# Patient Record
Sex: Female | Born: 1965
Health system: Southern US, Community
[De-identification: ages and names within clinical notes are randomized; demographics above are authoritative.]

## PROBLEM LIST (undated history)

## (undated) DIAGNOSIS — J189 Pneumonia, unspecified organism: Secondary | ICD-10-CM

## (undated) DIAGNOSIS — G4730 Sleep apnea, unspecified: Secondary | ICD-10-CM

## (undated) DIAGNOSIS — G43909 Migraine, unspecified, not intractable, without status migrainosus: Secondary | ICD-10-CM

## (undated) DIAGNOSIS — K644 Residual hemorrhoidal skin tags: Secondary | ICD-10-CM

## (undated) DIAGNOSIS — R7611 Nonspecific reaction to tuberculin skin test without active tuberculosis: Secondary | ICD-10-CM

## (undated) DIAGNOSIS — R918 Other nonspecific abnormal finding of lung field: Secondary | ICD-10-CM

## (undated) DIAGNOSIS — A159 Respiratory tuberculosis unspecified: Secondary | ICD-10-CM

## (undated) DIAGNOSIS — E785 Hyperlipidemia, unspecified: Secondary | ICD-10-CM

## (undated) DIAGNOSIS — Z8701 Personal history of pneumonia (recurrent): Secondary | ICD-10-CM

## (undated) DIAGNOSIS — E0590 Thyrotoxicosis, unspecified without thyrotoxic crisis or storm: Secondary | ICD-10-CM

## (undated) DIAGNOSIS — Z9289 Personal history of other medical treatment: Secondary | ICD-10-CM

## (undated) DIAGNOSIS — Z9889 Other specified postprocedural states: Secondary | ICD-10-CM

## (undated) DIAGNOSIS — T8859XA Other complications of anesthesia, initial encounter: Secondary | ICD-10-CM

## (undated) DIAGNOSIS — I10 Essential (primary) hypertension: Secondary | ICD-10-CM

## (undated) DIAGNOSIS — R112 Nausea with vomiting, unspecified: Secondary | ICD-10-CM

## (undated) DIAGNOSIS — G510 Bell's palsy: Secondary | ICD-10-CM

## (undated) DIAGNOSIS — I493 Ventricular premature depolarization: Secondary | ICD-10-CM

## (undated) DIAGNOSIS — C719 Malignant neoplasm of brain, unspecified: Secondary | ICD-10-CM

## (undated) DIAGNOSIS — D509 Iron deficiency anemia, unspecified: Secondary | ICD-10-CM

## (undated) HISTORY — PX: TUBAL LIGATION: SHX77

## (undated) HISTORY — PX: ABDOMINAL HYSTERECTOMY: SHX81

## (undated) HISTORY — DX: Migraine, unspecified, not intractable, without status migrainosus: G43.909

## (undated) HISTORY — DX: Personal history of pneumonia (recurrent): Z87.01

## (undated) HISTORY — DX: Other nonspecific abnormal finding of lung field: R91.8

## (undated) HISTORY — PX: ENDOMETRIAL ABLATION: SHX621

## (undated) HISTORY — PX: HEMORRHOID SURGERY: SHX153

## (undated) HISTORY — DX: Essential (primary) hypertension: I10

## (undated) HISTORY — DX: Ventricular premature depolarization: I49.3

## (undated) HISTORY — PX: BREAST BIOPSY: SHX20

## (undated) HISTORY — DX: Nonspecific reaction to tuberculin skin test without active tuberculosis: R76.11

## (undated) HISTORY — DX: Hyperlipidemia, unspecified: E78.5

## (undated) HISTORY — DX: Bell's palsy: G51.0

## (undated) HISTORY — DX: Iron deficiency anemia, unspecified: D50.9

## (undated) HISTORY — DX: Residual hemorrhoidal skin tags: K64.4

## (undated) HISTORY — DX: Thyrotoxicosis, unspecified without thyrotoxic crisis or storm: E05.90

---

## 2000-05-30 ENCOUNTER — Encounter: Payer: Self-pay | Admitting: Family Medicine

## 2000-05-30 ENCOUNTER — Encounter: Admission: RE | Admit: 2000-05-30 | Discharge: 2000-05-30 | Payer: Self-pay | Admitting: Family Medicine

## 2000-10-14 ENCOUNTER — Encounter: Payer: Self-pay | Admitting: Internal Medicine

## 2000-10-14 ENCOUNTER — Emergency Department (HOSPITAL_COMMUNITY): Admission: EM | Admit: 2000-10-14 | Discharge: 2000-10-14 | Payer: Self-pay

## 2002-05-12 ENCOUNTER — Encounter: Admission: RE | Admit: 2002-05-12 | Discharge: 2002-07-28 | Payer: Self-pay | Admitting: Neurology

## 2003-07-18 ENCOUNTER — Encounter: Admission: RE | Admit: 2003-07-18 | Discharge: 2003-07-18 | Payer: Self-pay | Admitting: Family Medicine

## 2004-02-02 ENCOUNTER — Ambulatory Visit (HOSPITAL_COMMUNITY): Admission: RE | Admit: 2004-02-02 | Discharge: 2004-02-02 | Payer: Self-pay | Admitting: Obstetrics and Gynecology

## 2004-02-02 ENCOUNTER — Encounter (INDEPENDENT_AMBULATORY_CARE_PROVIDER_SITE_OTHER): Payer: Self-pay | Admitting: Specialist

## 2006-02-05 ENCOUNTER — Inpatient Hospital Stay (HOSPITAL_COMMUNITY): Admission: AD | Admit: 2006-02-05 | Discharge: 2006-02-06 | Payer: Self-pay | Admitting: Cardiology

## 2006-02-05 ENCOUNTER — Ambulatory Visit: Payer: Self-pay | Admitting: Cardiology

## 2006-11-04 ENCOUNTER — Ambulatory Visit: Payer: Self-pay | Admitting: Cardiology

## 2006-11-05 ENCOUNTER — Ambulatory Visit: Payer: Self-pay | Admitting: Cardiology

## 2006-11-21 ENCOUNTER — Ambulatory Visit: Payer: Self-pay | Admitting: Internal Medicine

## 2006-11-25 ENCOUNTER — Ambulatory Visit: Payer: Self-pay | Admitting: Cardiology

## 2007-02-19 ENCOUNTER — Ambulatory Visit: Payer: Self-pay | Admitting: Internal Medicine

## 2007-07-26 ENCOUNTER — Emergency Department (HOSPITAL_COMMUNITY): Admission: EM | Admit: 2007-07-26 | Discharge: 2007-07-26 | Payer: Self-pay | Admitting: Emergency Medicine

## 2007-09-08 ENCOUNTER — Ambulatory Visit: Payer: Self-pay | Admitting: Cardiology

## 2007-10-30 ENCOUNTER — Ambulatory Visit: Payer: Self-pay

## 2007-11-26 ENCOUNTER — Telehealth (INDEPENDENT_AMBULATORY_CARE_PROVIDER_SITE_OTHER): Payer: Self-pay | Admitting: *Deleted

## 2008-02-05 ENCOUNTER — Ambulatory Visit: Payer: Self-pay | Admitting: Emergency Medicine

## 2008-02-05 DIAGNOSIS — R05 Cough: Secondary | ICD-10-CM | POA: Insufficient documentation

## 2008-02-05 DIAGNOSIS — R053 Chronic cough: Secondary | ICD-10-CM | POA: Insufficient documentation

## 2008-02-05 DIAGNOSIS — E785 Hyperlipidemia, unspecified: Secondary | ICD-10-CM | POA: Insufficient documentation

## 2008-02-05 DIAGNOSIS — I10 Essential (primary) hypertension: Secondary | ICD-10-CM | POA: Insufficient documentation

## 2008-02-05 DIAGNOSIS — J984 Other disorders of lung: Secondary | ICD-10-CM

## 2008-02-05 DIAGNOSIS — R918 Other nonspecific abnormal finding of lung field: Secondary | ICD-10-CM | POA: Insufficient documentation

## 2008-02-05 DIAGNOSIS — R0602 Shortness of breath: Secondary | ICD-10-CM | POA: Insufficient documentation

## 2008-02-09 ENCOUNTER — Ambulatory Visit: Payer: Self-pay | Admitting: Cardiovascular Disease

## 2008-02-24 ENCOUNTER — Encounter: Payer: Self-pay | Admitting: Emergency Medicine

## 2008-04-11 ENCOUNTER — Ambulatory Visit: Payer: Self-pay | Admitting: Emergency Medicine

## 2008-04-11 LAB — CONVERTED CEMR LAB: Angiotensin 1 Converting Enzyme: 32 units/L (ref 9–67)

## 2008-04-13 ENCOUNTER — Encounter: Payer: Self-pay | Admitting: Emergency Medicine

## 2008-04-20 ENCOUNTER — Ambulatory Visit: Admission: RE | Admit: 2008-04-20 | Discharge: 2008-04-20 | Payer: Self-pay | Admitting: Emergency Medicine

## 2008-04-20 ENCOUNTER — Ambulatory Visit: Payer: Self-pay | Admitting: Emergency Medicine

## 2008-04-20 ENCOUNTER — Encounter: Payer: Self-pay | Admitting: Emergency Medicine

## 2008-04-27 ENCOUNTER — Telehealth: Payer: Self-pay | Admitting: Emergency Medicine

## 2008-04-28 ENCOUNTER — Ambulatory Visit: Payer: Self-pay | Admitting: Internal Medicine

## 2008-04-28 DIAGNOSIS — R079 Chest pain, unspecified: Secondary | ICD-10-CM | POA: Insufficient documentation

## 2008-05-03 ENCOUNTER — Telehealth (INDEPENDENT_AMBULATORY_CARE_PROVIDER_SITE_OTHER): Payer: Self-pay | Admitting: *Deleted

## 2008-05-04 ENCOUNTER — Ambulatory Visit: Payer: Self-pay | Admitting: Emergency Medicine

## 2008-06-07 ENCOUNTER — Telehealth: Payer: Self-pay | Admitting: Emergency Medicine

## 2008-10-05 ENCOUNTER — Ambulatory Visit: Payer: Self-pay | Admitting: Emergency Medicine

## 2008-11-09 ENCOUNTER — Ambulatory Visit: Payer: Self-pay | Admitting: Internal Medicine

## 2008-12-08 ENCOUNTER — Ambulatory Visit: Payer: Self-pay | Admitting: Emergency Medicine

## 2009-03-07 ENCOUNTER — Telehealth: Payer: Self-pay | Admitting: Emergency Medicine

## 2009-03-07 ENCOUNTER — Ambulatory Visit: Payer: Self-pay | Admitting: Emergency Medicine

## 2009-03-07 DIAGNOSIS — J069 Acute upper respiratory infection, unspecified: Secondary | ICD-10-CM | POA: Insufficient documentation

## 2009-03-13 ENCOUNTER — Telehealth: Payer: Self-pay | Admitting: Emergency Medicine

## 2009-05-04 HISTORY — PX: COLONOSCOPY: SHX5424

## 2009-05-23 ENCOUNTER — Ambulatory Visit: Payer: Self-pay | Admitting: Cardiology

## 2009-06-05 ENCOUNTER — Encounter (INDEPENDENT_AMBULATORY_CARE_PROVIDER_SITE_OTHER): Payer: Self-pay | Admitting: *Deleted

## 2009-06-05 ENCOUNTER — Ambulatory Visit (HOSPITAL_BASED_OUTPATIENT_CLINIC_OR_DEPARTMENT_OTHER): Admission: RE | Admit: 2009-06-05 | Discharge: 2009-06-05 | Payer: Self-pay | Admitting: General Surgery

## 2009-06-06 ENCOUNTER — Encounter (INDEPENDENT_AMBULATORY_CARE_PROVIDER_SITE_OTHER): Payer: Self-pay | Admitting: *Deleted

## 2009-06-14 ENCOUNTER — Encounter (INDEPENDENT_AMBULATORY_CARE_PROVIDER_SITE_OTHER): Payer: Self-pay | Admitting: *Deleted

## 2009-06-14 ENCOUNTER — Observation Stay (HOSPITAL_COMMUNITY): Admission: EM | Admit: 2009-06-14 | Discharge: 2009-06-15 | Payer: Self-pay | Admitting: Emergency Medicine

## 2009-06-15 ENCOUNTER — Encounter (INDEPENDENT_AMBULATORY_CARE_PROVIDER_SITE_OTHER): Payer: Self-pay | Admitting: *Deleted

## 2009-06-16 ENCOUNTER — Inpatient Hospital Stay (HOSPITAL_COMMUNITY): Admission: EM | Admit: 2009-06-16 | Discharge: 2009-06-19 | Payer: Self-pay | Admitting: Emergency Medicine

## 2009-06-16 ENCOUNTER — Encounter (INDEPENDENT_AMBULATORY_CARE_PROVIDER_SITE_OTHER): Payer: Self-pay | Admitting: *Deleted

## 2009-06-17 ENCOUNTER — Encounter (INDEPENDENT_AMBULATORY_CARE_PROVIDER_SITE_OTHER): Payer: Self-pay | Admitting: *Deleted

## 2010-01-26 ENCOUNTER — Other Ambulatory Visit: Admission: RE | Admit: 2010-01-26 | Discharge: 2010-01-26 | Payer: Self-pay | Admitting: Family Medicine

## 2010-01-26 ENCOUNTER — Ambulatory Visit: Payer: Self-pay | Admitting: Family Medicine

## 2010-01-26 DIAGNOSIS — R51 Headache: Secondary | ICD-10-CM | POA: Insufficient documentation

## 2010-01-26 DIAGNOSIS — E0590 Thyrotoxicosis, unspecified without thyrotoxic crisis or storm: Secondary | ICD-10-CM | POA: Insufficient documentation

## 2010-01-26 DIAGNOSIS — D509 Iron deficiency anemia, unspecified: Secondary | ICD-10-CM | POA: Insufficient documentation

## 2010-01-26 DIAGNOSIS — R519 Headache, unspecified: Secondary | ICD-10-CM | POA: Insufficient documentation

## 2010-01-26 LAB — CONVERTED CEMR LAB
Basophils Absolute: 0.1 10*3/uL (ref 0.0–0.1)
Basophils Relative: 1 % (ref 0–1)
CO2: 28 meq/L (ref 19–32)
Calcium: 10.1 mg/dL (ref 8.4–10.5)
Cholesterol: 238 mg/dL — ABNORMAL HIGH (ref 0–200)
Eosinophils Absolute: 0.2 10*3/uL (ref 0.0–0.7)
Eosinophils Relative: 3 % (ref 0–5)
Glucose, Urine, Semiquant: NEGATIVE
HCT: 40.7 % (ref 36.0–46.0)
HDL: 32 mg/dL — ABNORMAL LOW (ref >39)
Hemoglobin: 13.8 g/dL (ref 12.0–15.0)
Ketones, urine, test strip: NEGATIVE
LDL Cholesterol: 184 mg/dL — ABNORMAL HIGH (ref 0–99)
Lymphs Abs: 2.5 10*3/uL (ref 0.7–4.0)
Monocytes Relative: 5 % (ref 3–12)
Protein, U semiquant: NEGATIVE
RDW: 12.5 % (ref 11.5–15.5)
Sodium: 141 meq/L (ref 135–145)
Specific Gravity, Urine: 1.015
TSH: 3.999 microintl units/mL (ref 0.350–4.500)
Triglycerides: 108 mg/dL (ref <150)
VLDL: 22 mg/dL (ref 0–40)

## 2010-02-02 ENCOUNTER — Ambulatory Visit: Payer: Self-pay | Admitting: Family Medicine

## 2010-02-13 ENCOUNTER — Telehealth: Payer: Self-pay | Admitting: *Deleted

## 2010-02-16 ENCOUNTER — Telehealth: Payer: Self-pay | Admitting: Family Medicine

## 2010-02-22 ENCOUNTER — Telehealth: Payer: Self-pay | Admitting: Family Medicine

## 2010-02-22 DIAGNOSIS — R1084 Generalized abdominal pain: Secondary | ICD-10-CM | POA: Insufficient documentation

## 2010-02-23 ENCOUNTER — Encounter: Payer: Self-pay | Admitting: Internal Medicine

## 2010-03-04 ENCOUNTER — Emergency Department (HOSPITAL_COMMUNITY): Admission: EM | Admit: 2010-03-04 | Discharge: 2010-03-04 | Payer: Self-pay | Admitting: Emergency Medicine

## 2010-03-06 ENCOUNTER — Ambulatory Visit: Payer: Self-pay | Admitting: Family Medicine

## 2010-03-06 ENCOUNTER — Telehealth: Payer: Self-pay | Admitting: Family Medicine

## 2010-03-06 DIAGNOSIS — M62838 Other muscle spasm: Secondary | ICD-10-CM | POA: Insufficient documentation

## 2010-03-14 ENCOUNTER — Encounter
Admission: RE | Admit: 2010-03-14 | Discharge: 2010-05-02 | Payer: Self-pay | Source: Home / Self Care | Attending: Family Medicine | Admitting: Family Medicine

## 2010-04-03 ENCOUNTER — Telehealth: Payer: Self-pay | Admitting: Internal Medicine

## 2010-04-04 ENCOUNTER — Telehealth: Payer: Self-pay | Admitting: Internal Medicine

## 2010-05-08 ENCOUNTER — Ambulatory Visit: Admit: 2010-05-08 | Payer: Self-pay | Admitting: Family Medicine

## 2010-05-11 ENCOUNTER — Telehealth: Payer: Self-pay | Admitting: Internal Medicine

## 2010-05-11 ENCOUNTER — Ambulatory Visit: Admit: 2010-05-11 | Payer: Self-pay | Admitting: Internal Medicine

## 2010-05-14 ENCOUNTER — Ambulatory Visit: Admit: 2010-05-14 | Payer: Self-pay | Admitting: Family Medicine

## 2010-05-25 ENCOUNTER — Telehealth: Payer: Self-pay | Admitting: Family Medicine

## 2010-05-25 DIAGNOSIS — K649 Unspecified hemorrhoids: Secondary | ICD-10-CM | POA: Insufficient documentation

## 2010-05-25 DIAGNOSIS — K5900 Constipation, unspecified: Secondary | ICD-10-CM | POA: Insufficient documentation

## 2010-06-07 NOTE — Progress Notes (Signed)
----   Converted from flag ---- ---- 05/11/2010 9:35 AM, Hilarie Fredrickson MD wrote: ok. covert this to phone note please  ---- 05/11/2010 9:19 AM, Milford Cage NCMA wrote:   ---- 05/11/2010 8:58 AM, [place] Johnson wrote: hEY This patient just called to cancel her appt because she did not get off work..she was a new pt at 3:30 ------------------------------  Phone Note Other Incoming   Caller: patient

## 2010-06-07 NOTE — Progress Notes (Signed)
Summary: Pt calle req status of FMLA paperwork  Phone Note Call from Patient Call back at [phone] cell      Caller: Patient Summary of Call: Pt called req status of FMLA papers she brought by the office last week. Pt is needing to pick this up asap. Pls call when ready for pick up.  Initial call taken by: Lucy Antigua,  February 13, 2010 8:38 AM  Follow-up for Phone Call        ??????????????please call the patient to find out what the FMLA form is for ?????????  Roderick Pee MD,  February 13, 2010 1:13 PM  called cell number and voicemail box has not been set up, called home number and it's the wrong number and work number has been disconnected. DeShannon Smith CMA Duncan Dull)  February 13, 2010 1:32 PM   Additional Follow-up for Phone Call Additional follow up Details #1::        Pt says that because she as doubled up on stool softeners, as per Dr. Barbette Or recommendation, she is late for work. She is needing FMLA in order to cover being late for her job. Pt says that she is having pain where she is have to go so much.    Additional Follow-up by: Lucy Antigua,  February 13, 2010 2:09 PM    Additional Follow-up for Phone Call Additional follow up Details #2::    Pt is having more pain and bleeding rectally.  Will call the surgeon, and let us know how they would like to proceed? Follow-up by: Lynann Beaver CMA,  February 14, 2010 1:13 PM  Additional Follow-up for Phone Call Additional follow up Details #3:: Details for Additional Follow-up Action Taken: refer to general surgery for further evaluation.  Additional Follow-up by: Roderick Pee MD,  February 15, 2010 8:17 AM   Appended Document: Pt calle req status of FMLA paperwork what should she do about her FMLA papers?

## 2010-06-07 NOTE — Assessment & Plan Note (Signed)
Summary: 1 wk rov/njr   Vital Signs:  Patient profile:   45 year old female Weight:      150 pounds Temp:     99.2 degrees F oral BP sitting:   120 / 82  (left arm) Cuff size:   regular  Vitals Entered By: Kathrynn Speed CMA (February 02, 2010 2:37 PM) CC: 1 wk rov, lads for review, src Is Patient Diabetic? No   Primary Care Provider:  Dr Day  CC:  1 wk rov, lads for review, and src.  History of Present Illness: [name] is a 45 year old female, who comes in today for evaluation of hypertension.  We saw her on September the 23rd with elevation of her blood pressure 160 to 180 over hundred.  We start her on Hyzaar 50 -- 12.5.  BP now 120/80.  Lab data the was within normal limits except for marked elevation of her cholesterol total 238, HDL low at 32, LDL high at 184, with normal triglycerides.  Hyperlipidemia runs on the mother's side of her family  Preventive Screening-Counseling & Management  Alcohol-Tobacco     Smoking Status: never  Current Medications (verified): 1)  Aleve 220 Mg Tabs (Naproxen Sodium) .... As Needed 2)  Excedrin Extra Strength [phone] Mg Tabs (Aspirin-Acetaminophen-Caffeine) .... As Needed 3)  Zomig Zmt 2.5 Mg Tbdp (Zolmitriptan) .... Uad For Migraines 4)  Hyzaar 50-12.5 Mg Tabs (Losartan Potassium-Hctz) .... Take 1 Tablet By Mouth Every Morning  Allergies (verified): No Known Drug Allergies  Review of Systems      See HPI  Physical Exam  General:  Well-developed,well-nourished,in no acute distress; alert,appropriate and cooperative throughout examination   Impression & Recommendations:  Problem # 1:  HYPERTENSION (ICD-401.9) Assessment Improved  Her updated medication list for this problem includes:    Hyzaar 50-12.5 Mg Tabs (Losartan potassium-hctz) .Marland Kitchen... Take 1 tablet by mouth every morning  Orders: Prescription Created Electronically [phone])  Problem # 2:  HYPERLIPIDEMIA (ICD-272.4) Assessment: Deteriorated  Her updated  medication list for this problem includes:    Zocor 20 Mg Tabs (Simvastatin) .Marland Kitchen... 1 tab @ bedtime  Orders: Prescription Created Electronically [phone])  Complete Medication List: 1)  Aleve 220 Mg Tabs (Naproxen sodium) .... As needed 2)  Excedrin Extra Strength [phone] Mg Tabs (Aspirin-acetaminophen-caffeine) .... As needed 3)  Zomig Zmt 2.5 Mg Tbdp (Zolmitriptan) .... Uad for migraines 4)  Hyzaar 50-12.5 Mg Tabs (Losartan potassium-hctz) .... Take 1 tablet by mouth every morning 5)  Zocor 20 Mg Tabs (Simvastatin) .Marland Kitchen.. 1 tab @ bedtime  Patient Instructions: 1)  continued the blood pressure medicine daily, and check your blood pressure, just once a week since it is  back to normal.  Begin Zocor 20 mg nightly along with an 81-mg baby aspirin. 2)  Please schedule a follow-up appointment in 2 months. 3)  Hepatic Panel prior to visit, ICD-9:......272.00 4)  Lipid Panel prior to visit, ICD-9: 5)  BMP prior to visit, ICD-9: Prescriptions: ZOCOR 20 MG TABS (SIMVASTATIN) 1 tab @ bedtime  #100 x 3   Entered and Authorized by:   Roderick Pee MD   Signed by:   Roderick Pee MD on 02/02/2010   Method used:   Electronically to        [facility]  [address] (retail)       [name]       [place], [place]  [address]       Ph: [phone]  Fax: [phone]   RxID:   [id number]

## 2010-06-07 NOTE — Progress Notes (Signed)
Summary: FMLA FORMS  Phone Note Call from Patient Call back at Home Phone [phone]   Caller: Patient Call For: Roderick Pee MD Summary of Call: Pt is call back regarding fmla paperwork she dropped off last week. Initial call taken by: Heron Sabins,  February 16, 2010 2:06 PM  Follow-up for Phone Call        sundrae ......... please help [place] with this Follow-up by: Roderick Pee MD,  February 16, 2010 2:28 PM  Additional Follow-up for Phone Call Additional follow up Details #1::        Unable to reach pt via phone.  Additional Follow-up by: Trixie Dredge,  February 22, 2010 9:08 AM

## 2010-06-07 NOTE — Op Note (Signed)
Summary: Operative Report       NAME:  [name], [name]              ACCOUNT NO.:  [id number]      MEDICAL RECORD NO.:  [id number]          PATIENT TYPE:  AMB      LOCATION:  DSC                          FACILITY:  [facility]      PHYSICIAN:  Gabrielle Dare. Janee Morn, M.D.DATE OF BIRTH:  May 05, 1966      DATE OF PROCEDURE:  06/05/2009   DATE OF DISCHARGE:                                  OPERATIVE REPORT      PREOPERATIVE DIAGNOSIS:  Internal hemorrhoid.      POSTOPERATIVE DIAGNOSIS:  Internal hemorrhoid.      PROCEDURES:  Internal hemorrhoidectomy single-column.      SURGEON:  Gabrielle Dare. Janee Morn, MD      ANESTHESIA:  General.      HISTORY OF PRESENT ILLNESS:  [name] is a 45 year old female who I   evaluated in the office for symptomatic internal hemorrhoids.  This was   too low and painful to tolerate banding in the office so she presents   for elective internal hemorrhoidectomy.      PROCEDURE IN DETAIL:  Informed consent was obtained.  The patient was   identified in the preop holding area.  She received intravenous   antibiotics.  She was brought to the operating room.  General anesthesia   with laryngeal mask airway was administered by the anesthesia staff.   She was placed in lithotomy position.  Her perianal area was prepped and   draped in sterile fashion.  Time-out procedure was done.  0.5% Marcaine   with epinephrine was injected around the perianal region.  Digital   rectal exam revealed a large internal hemorrhoid around the 9 o'clock   position.  Anoscopy was then done again visualizing this large internal   hemorrhoid at the 9 o'clock position.  No other significant internal   hemorrhoids were seen.  External anal exam did reveal just some skin   tags and no external hemorrhoids.  There was no evidence of infection.   At this time, we proceeded with the internal hemorrhoidectomy.  This   hemorrhoid was large and appeared somewhat excoriated and raw along the   surface.  A  2-0 chromic suture was placed high above the base of the   hemorrhoid.  A triangular incision was made excising the entirety of the   hemorrhoid that did extend out to the junction with the anoderm.   Subsequently, the vein was dissected up and out with the hemorrhoid and   it was excised in one piece using Bovie cautery achieving excellent   hemostasis.  The mucosal defect was then closed by using that 2-0   chromic that we had placed initially in a running fashion.  This   completely closed the mucosal defect and had good hemostasis.  The   remainder of the rectum was then inspected with the retractor   circumferentially.  There was no other significant internal hemorrhoids.   There was no bleeding from the hemorrhoidectomy site and the suture was   intact.  The area was  copiously irrigated.  Irrigation fluid returned   clear.  Suture line was checked and there was no further bleeding or   swelling or hematoma.  A thrombin-   soaked Gelfoam pad was placed as an endorectal dressing followed by   sterile gauze.  Sponge, needle, and instrument counts were all correct.   The patient tolerated the procedure well without apparent complication   and was taken to the recovery room in stable condition.               Gabrielle Dare Janee Morn, M.D.            BET/MEDQ  D:  06/05/2009  T:  06/06/2009  Job:  [id number]      cc:   Jordan Hawks. Elnoria Howard, MD         Electronically Signed by Violeta Gelinas M.D. on 06/08/2009 10:14:39 PM

## 2010-06-07 NOTE — Letter (Signed)
Summary: New Patient letter  [facility]  [address] [place], [place] [address]   Phone: [phone]  Fax: [phone]       02/23/2010 MRN: [id number]  [name] [address] [place], [place]  [address]  Dear [name],  Welcome to the Gastroenterology Division at [facility].    You are scheduled to see Dr. Marina Goodell on 04-04-10 at 9:15am on the 3rd floor at [facility], [address]  We ask that you try to arrive at our office 15 minutes prior to your appointment time to allow for check-in.  We would like you to complete the enclosed self-administered evaluation form prior to your visit and bring it with you on the day of your appointment.  We will review it with you.  Also, please bring a complete list of all your medications or, if you prefer, bring the medication bottles and we will list them.  Please bring your insurance card so that we may make a copy of it.  If your insurance requires a referral to see a specialist, please bring your referral form from your primary care physician.  Co-payments are due at the time of your visit and may be paid by cash, check or credit card.     Your office visit will consist of a consult with your physician (includes a physical exam), any laboratory testing he/she may order, scheduling of any necessary diagnostic testing (e.g. x-ray, ultrasound, CT-scan), and scheduling of a procedure (e.g. Endoscopy, Colonoscopy) if required.  Please allow enough time on your schedule to allow for any/all of these possibilities.    If you cannot keep your appointment, please call [phone] to cancel or reschedule prior to your appointment date.  This allows [place] the opportunity to schedule an appointment for another patient in need of care.  If you do not cancel or reschedule by 5 p.m. the business day prior to your appointment date, you will be charged a $50.00 late cancellation/no-show fee.    Thank you for choosing [facility] for your medical needs.  We appreciate the opportunity to care for you.  Please visit [place] at our website  to learn more about our practice.                     Sincerely,                                                             The Gastroenterology Division

## 2010-06-07 NOTE — Assessment & Plan Note (Signed)
Summary: new to est//ccm---PT RSC (BMP) // RS   Vital Signs:  Patient profile:   45 year old female Height:      68 inches Weight:      155 pounds Temp:     98.3 degrees F oral BP sitting:   160 / 120  (left arm) Cuff size:   regular  Vitals Entered By: Kern Reap CMA Duncan Dull) (January 26, 2010 4:01 PM) CC: new to establish care, tired Is Patient Diabetic? No Pain Assessment Patient in pain? no        Primary Care Provider:  Dr Day  CC:  new to establish care and tired.  History of Present Illness: [name] is a 45 year old, divorced female, G2, P2, nonsmoker, who comes in today as a new patient for evaluation of multiple issues.  She is a history of hypertension was placed on 5 mg of the beta-blocker is not been monitoring her blood pressure.  Last blood pressure was done February 2011 was apparently normal.  BP today 160/120.she has been on blood pressure medication.  The past and at the end of our discussion.  She recalls an ACE inhibitor gave her a cough  She's not had a physical or a Pap in over two years.  She has had an endometrial ablation therefore, does not have any periods.  She's had an and history of hyperlipidemia, and she, herself has had high cholesterol, but is never been addressed.  She also has a history of migraine headaches.  They occur about 6 times per year.  Over-the-cmedications.  Does not help.  She also has a history of PVCs and a cardiac evaluation negative.  CT scan showed pulmonary nodules was referred to pulmonary workup negative, except for positive PPD.  Was given 6 months of INH.  Advised never to get a skin test ever again.  She's never had a mammogram.  Two children 61 year old son and 65 year old daughter in good health.  Tetanus 2009.  She also had hyperthyroidism, when she was 45 years of age and treated with oral medications.  In she had Bell's palsy, which has left her with some slight residual paralysis in her left eyelid. she's also  had a history of hemorrhoid surgery x 2.She is not taking her stool softeners as directed  Preventive Screening-Counseling & Management  Alcohol-Tobacco     Smoking Status: never  Allergies (verified): No Known Drug Allergies  Past History:  Past medical, surgical, family and social histories (including risk factors) reviewed, and no changes noted (except as noted below).  Past Medical History: Hyperlipidemia Hypertension Pulmonary Nodules, not seen on plain cxr...........................................Marland KitchenByrum     - First detected 10/07     - RE cT 10/08      - Pos IPPD > 15 mm 12/09     - FOB/lavage 04/20/08 neg afb smear Headache Anemia-iron deficiency Positive PPD Hyperthyroidism hx 45years old - oral meds bells palsey with res. left lag cough from ACE inhibitor  Past Surgical History: Reviewed history from 05/23/2009 and no changes required. Tubal ligation-17 yrs ago Endometrial ablation-4-5 yrs ago  Family History: Reviewed history from 02/05/2008 and no changes required. aunts, uncles-heart disease mat grandmother-heart disease sister-heart disease, deceased - kidney failure aunts and uncles-lung and colon CA 3 brothers -healthy  Social History: Reviewed history from 04/28/2008 and no changes required. Patient never smoked.  Pt is divorced with children. Occupation: Corporate investment banker Possible TB exposure 2008 (after nodules found). from [place], lived there and [place].  Never owned birds.   Review of Systems      See HPI  Physical Exam  General:  Well-developed,well-nourished,in no acute distress; alert,appropriate and cooperative throughout examination Head:  Normocephalic and atraumatic without obvious abnormalities. No apparent alopecia or balding. Eyes:  rather normal left eye normal except for just 1 mm of lid lag Ears:  External ear exam shows no significant lesions or deformities.  Otoscopic examination reveals clear canals, tympanic  membranes are intact bilaterally without bulging, retraction, inflammation or discharge. Hearing is grossly normal bilaterally. Nose:  External nasal examination shows no deformity or inflammation. Nasal mucosa are pink and moist without lesions or exudates. Mouth:  Oral mucosa and oropharynx without lesions or exudates.  Teeth in good repair. Neck:  No deformities, masses, or tenderness noted. Breasts:  No mass, nodules, thickening, tenderness, bulging, retraction, inflamation, nipple discharge or skin changes noted. thickening right and left breast from the 3 to the 9 o'clock position.  No palpable nodules  Lungs:  Normal respiratory effort, chest expands symmetrically. Lungs are clear to auscultation, no crackles or wheezes. Heart:  Normal rate and regular rhythm. S1 and S2 normal without gallop, murmur, click, rub or other extra sounds. Abdomen:  Bowel sounds positive,abdomen soft and non-tender without masses, organomegaly or hernias noted. Rectal:  extremely tight sphincter guaiac-negative Genitalia:  externa genitalia within normal limits.  Vaginal vault was normal.  Cervix was visualized.  Pap smear was done.  Bimanual exam shows the uterus two times normal.  History of fibroids Msk:  No deformity or scoliosis noted of thoracic or lumbar spine.   Pulses:  R and L carotid,radial,femoral,dorsalis pedis and posterior tibial pulses are full and equal bilaterally Extremities:  No clubbing, cyanosis, edema, or deformity noted with normal full range of motion of all joints.   Neurologic:  No cranial nerve deficits noted. Station and gait are normal. Plantar reflexes are down-going bilaterally. DTRs are symmetrical throughout. Sensory, motor and coordinative functions appear intact. Skin:  Intact without suspicious lesions or rashes Cervical Nodes:  No lymphadenopathy noted Axillary Nodes:  No palpable lymphadenopathy Inguinal Nodes:  No significant adenopathy Psych:  Cognition and judgment appear  intact. Alert and cooperative with normal attention span and concentration. No apparent delusions, illusions, hallucinations   Impression & Recommendations:  Problem # 1:  HEADACHE (ICD-784.0) Assessment New  The following medications were removed from the medication list:    Bystolic 5 Mg Tabs (Nebivolol hcl) ..... One daily Her updated medication list for this problem includes:    Aleve 220 Mg Tabs (Naproxen sodium) .Marland Kitchen... As needed    Excedrin Extra Strength 250-250-65 Mg Tabs (Aspirin-acetaminophen-caffeine) .Marland Kitchen... As needed    Zomig Zmt 2.5 Mg Tbdp (Zolmitriptan) ..... Uad for migraines  Orders: Venipuncture (04540) TLB-Lipid Panel (80061-LIPID) TLB-BMP (Basic Metabolic Panel-BMET) (80048-METABOL) TLB-CBC Platelet - w/Differential (85025-CBCD) TLB-Hepatic/Liver Function Pnl (80076-HEPATIC) TLB-TSH (Thyroid Stimulating Hormone) (98119-JYN) Prescription Created Electronically [phone]) UA Dipstick w/o Micro (automated)  (81003)  Problem # 2:  HYPERTENSION (ICD-401.9) Assessment: Deteriorated  The following medications were removed from the medication list:    Bystolic 5 Mg Tabs (Nebivolol hcl) ..... One daily    Zestoretic 20-25 Mg Tabs (Lisinopril-hydrochlorothiazide) .Marland Kitchen... Take 1 tablet by mouth every morning Her updated medication list for this problem includes:    Hyzaar 50-12.5 Mg Tabs (Losartan potassium-hctz) .Marland Kitchen... Take 1 tablet by mouth every morning  Orders: EKG w/ Interpretation (93000) Venipuncture (21308) TLB-Lipid Panel (80061-LIPID) TLB-BMP (Basic Metabolic Panel-BMET) (80048-METABOL) TLB-CBC Platelet - w/Differential (85025-CBCD) TLB-Hepatic/Liver Function  Pnl (80076-HEPATIC) TLB-TSH (Thyroid Stimulating Hormone) [phone]) Prescription Created Electronically [phone]) UA Dipstick w/o Micro (automated)  (81003)  Problem # 3:  HYPERLIPIDEMIA (ICD-272.4) Assessment: Unchanged  Orders: EKG w/ Interpretation (93000) Venipuncture (11914) TLB-Lipid Panel  (80061-LIPID) TLB-BMP (Basic Metabolic Panel-BMET) (80048-METABOL) TLB-CBC Platelet - w/Differential (85025-CBCD) TLB-Hepatic/Liver Function Pnl (80076-HEPATIC) TLB-TSH (Thyroid Stimulating Hormone) (78295-AOZ) Prescription Created Electronically [phone]) UA Dipstick w/o Micro (automated)  (81003)  Problem # 4:  Preventive Health Care (ICD-V70.0) Assessment: New  Complete Medication List: 1)  Aleve 220 Mg Tabs (Naproxen sodium) .... As needed 2)  Excedrin Extra Strength [phone] Mg Tabs (Aspirin-acetaminophen-caffeine) .... As needed 3)  Zomig Zmt 2.5 Mg Tbdp (Zolmitriptan) .... Uad for migraines 4)  Hyzaar 50-12.5 Mg Tabs (Losartan potassium-hctz) .... Take 1 tablet by mouth every morning  Patient Instructions: 1)  begin hyzaar  take one tablet now, then one tablet every morning starting tomorrow morning.  Check her blood pressure daily in the morning.  Return in one week for follow-up.when you return to bring a record of all your blood pressure readings and the device 2)  Zomig stat for any migraine headache. 3)   BSE monthly and get set up for a mammogram. 4)  Stay on a complete salt free diet and walk 20 minutes daily 5)  Please schedule a follow-up appointment in 1 year. 6)  Take an Aspirin every day. 7)  take your stool softeners on a daily basis, so you're having at least one to two loose bowel movements daily Prescriptions: HYZAAR 50-12.5 MG TABS (LOSARTAN POTASSIUM-HCTZ) Take 1 tablet by mouth every morning  #100 x 3   Entered and Authorized by:   Roderick Pee MD   Signed by:   Roderick Pee MD on 01/26/2010   Method used:   Electronically to        [facility]  [address] (retail)       [address]       [place], [place]  [address]       Ph: [phone]       Fax: [phone]   RxID:   [id number] ZOMIG ZMT 2.5 MG TBDP (ZOLMITRIPTAN) UAD for migraines  #6 x 4   Entered and Authorized by:   Roderick Pee MD   Signed by:   Roderick Pee MD on  01/26/2010   Method used:   Electronically to        [facility]  [address] (retail)       [address]       [place], [place]  [address]       Ph: [phone]       Fax: [phone]   RxID:   [id number] ZESTORETIC 20-25 MG TABS (LISINOPRIL-HYDROCHLOROTHIAZIDE) Take 1 tablet by mouth every morning  #100 x 3   Entered and Authorized by:   Roderick Pee MD   Signed by:   Roderick Pee MD on 01/26/2010   Method used:   Electronically to        [facility]  [address] ([facility]ail)       [address]       [place], [place]  [address]       Ph: [phone]       Fax: [phone]   RxID:   [id number]    Immunization History:  Tetanus/Td Immunization  History:    Tetanus/Td:  historical (05/07/2007)  Laboratory Results   Urine Tests  Date/Time Received: January 26, 2010   Routine Urinalysis   Color: yellow Appearance: Clear Glucose: negative   (Normal Range: Negative) Bilirubin: negative   (Normal Range: Negative) Ketone: negative   (Normal Range: Negative) Spec. Gravity: 1.015   (Normal Range: 1.003-1.035) Blood: negative   (Normal Range: Negative) pH: 6.0   (Normal Range: 5.0-8.0) Protein: negative   (Normal Range: Negative) Urobilinogen: 0.2   (Normal Range: 0-1) Nitrite: negative   (Normal Range: Negative) Leukocyte Esterace: negative   (Normal Range: Negative)    Comments: Kern Reap CMA (AAMA)  January 26, 2010 5:20 PM

## 2010-06-07 NOTE — Progress Notes (Signed)
Summary: Questions  Phone Note Call from Patient Call back at Home Phone [phone]   Caller: Patient Call For: Dr. Marina Goodell Reason for Call: Talk to Nurse Summary of Call: Pt is returing your call about her constipation Initial call taken by: [place] Johnson,  April 04, 2010 9:53 AM  Follow-up for Phone Call        Left message for patient to call back. Teryl Lucy RN  April 04, 2010 10:06 AM  Pt. informed that after discussion with Dr.Perry she should re-start Miiralax as instucted(pt. did not go buy any yet)and he advises she contact Dr.Todd for additional orders as she has not been seen by Dr.Perry yet. and . to keep appt.as scheduled.. Follow-up by: Teryl Lucy RN,  April 04, 2010 10:20 AM

## 2010-06-07 NOTE — Letter (Signed)
Summary: Out of Work  Adult nurse at [employer]  [address]   [place], [place] [address]   Phone: [phone]  Fax: [phone]    March 06, 2010   Employee:  KILYN MARAGH    To Whom It May Concern:   For Medical reasons, please excuse the above named employee from work for the following dates:  Start:   March 05, 2010  End:    If you need additional information, please feel free to contact our office.         Sincerely,    Kelle Darting, MD

## 2010-06-07 NOTE — Progress Notes (Signed)
Summary: Triage / constipation (new to GI)  Phone Note Call from Patient Call back at Home Phone [phone]   Caller: Patient Call For: Dr. Marina Goodell Summary of Call: had hemorroid surgery and no BM in 3 wks. Has appt. sch'd for 05-11-10 and requesting sooner appt. Initial call taken by: Karna Christmas,  April 03, 2010 1:04 PM  Follow-up for Phone Call        Pt. had hemorrhoid surgery in Jan.11 by Dr.Thompson and she said  2 weeks post -op. she was taken back to surgery because an artery ruptured. Sees Dr.Todd and was on Miralax 3-4 x/day but had to have him write a note for work for her because she went so often. She stopped her Miralax because she said I don't want to be on it for life at my age.Explained the safety of Miralax and instructed her on how to titrate it and told her I will notify you for further orders.Says bowels have not moved for 3 weeks and she vomited on Saturday.Offered appt. with N..P. for tomorrow but she says she can' come and will keep appt. in Jan. with you. Follow-up by: Teryl Lucy RN,  April 03, 2010 2:42 PM     Appended Document: Triage / constipation (new to GI) message left to call back.  Appended Document: Triage / constipation (new to GI) Message left for patient to call back.  Appended Document: Triage / constipation (new to GI) see new triage note

## 2010-06-07 NOTE — Discharge Summary (Signed)
Summary: discharge Summary       NAME:  [name], [name]              ACCOUNT NO.:  [id number]      MEDICAL RECORD NO.:  [id number]           PATIENT TYPE:      LOCATION:                                 FACILITY:      PHYSICIAN:  Gabrielle Dare. Janee Morn, M.D.DATE OF BIRTH:  07/12/1965      DATE OF ADMISSION:   DATE OF DISCHARGE:                                  DISCHARGE SUMMARY      ADDENDUM      In light of the patient's anemia with hemoglobin of 8.6, I recommended   that she take iron supplementation once she gets out.               Gabrielle Dare Janee Morn, M.D.            BET/MEDQ  D:  06/15/2009  T:  06/15/2009  Job:  [id number]         Electronically Signed by Violeta Gelinas M.D. on 06/20/2009 04:51:55 PM
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
## 2010-06-07 NOTE — Progress Notes (Signed)
Summary: REQ FOR APPT AFTER 4PM  Phone Note Call from Patient   Caller: Patient   [phone] Summary of Call: Pt called to schedula appt for today and was scheduled for work-in appt after lunch.... pt now states that her employer will not let her off of work and she won't be able to come in for OV till after 4pm.... Schedule already overbooked after 4pm.... Can you advise if okay to add pt to end of day?  Pt can be reached at cell # [phone] to advise.....Marland Kitchen  or work # [phone].  Initial call taken by: Debbra Riding,  March 06, 2010 10:46 AM  Follow-up for Phone Call        [place]........... add the end of the day Follow-up by: Roderick Pee MD,  March 06, 2010 12:15 PM  Additional Follow-up for Phone Call Additional follow up Details #1::        Contacted pt and appt was scheduled at end of day.  Additional Follow-up by: Debbra Riding,  March 06, 2010 12:21 PM

## 2010-06-07 NOTE — Discharge Summary (Signed)
Summary: Discharge Summary       NAME:  [name], [name]              ACCOUNT NO.:  [id number]      MEDICAL RECORD NO.:  [id number]          PATIENT TYPE:  INP      LOCATION:  5156                         FACILITY:  [facility]      PHYSICIAN:  Gabrielle Dare. Janee Morn, M.D.DATE OF BIRTH:  Sep 06, 1965      DATE OF ADMISSION:  06/14/2009   DATE OF DISCHARGE:  06/15/2009                                  DISCHARGE SUMMARY      DISCHARGE DIAGNOSIS:  Rectal bleeding, status post hemorrhoidectomy.      HISTORY OF PRESENT ILLNESS:  [name] is a 45 year old African   American female who underwent internal hemorrhoidectomy on June 05, 2009.  She developed a large amount of blood per rectum with a bowel   movement early on the morning of admission.  She was seen in the   emergency department and admitted to the hospital.      HOSPITAL COURSE:  Anoscopy done in the emergency department demonstrated   just some clotted blood and looked like she had disrupted upper portion   of her suture line.  There was no significant active bleeding at that   time.  She was admitted and hydrated.  She was also having some   abdominal pain and nausea.  Abdominal series x-rays demonstrated mild   constipation, but no other abnormalities.  The blood per rectum tapered   off by late afternoon and evening on the day of admission.  She did have   a bowel movement overnight, which has had a little streaking of blood,   but no other blood per rectum.  She feels much better.  Abdominal pain   has completely resolved, and she tolerated liquids.  She is discharged   home in stable condition.      DISCHARGE DIET:  Regular.      DISCHARGE ACTIVITY:  As tolerated.      DISCHARGE MEDICATIONS:  She is to resume her home medications including   oxycodone/APAP 5/325 one to two every 4 hours as needed for pain,   Bystolic 5 mg daily, Excedrin Migraine p.r.n., MiraLax daily as needed   for constipation.      Followup is as  scheduled with myself on June 21, 2009.               Gabrielle Dare Janee Morn, M.D.            BET/MEDQ  D:  06/15/2009  T:  06/15/2009  Job:  [id number]         Electronically Signed by Violeta Gelinas M.D. on 06/20/2009 04:51:53 PM

## 2010-06-07 NOTE — Assessment & Plan Note (Signed)
Summary: rov per pt call/lg   Visit Type:  Follow-up Referring Provider:  Dr Antoine Poche Primary Provider:  Dr Day  CC:  chest pain.  History of Present Illness: The patient presents for followup of chest pain and palpitations. I last saw her in 2009. We performed an exercise treadmill test which demonstrated no abnormalities. Since that time she has had rare chest discomfort for which she takes Aleve. She has rare palpitations but no presyncope or syncope. She has started exercising at the gym. With this she cannot bring on any symptoms. She does not describe any shortness of breath, PND or orthopnea. She's had no weight gain or swelling.  Current Medications (verified): 1)  Alprazolam 0.25 Mg Tbdp (Alprazolam) .... As Needed 2)  Aleve 220 Mg Tabs (Naproxen Sodium) .... As Needed 3)  Excedrin Extra Strength [phone] Mg Tabs (Aspirin-Acetaminophen-Caffeine) .... As Needed 4)  Bystolic 5 Mg Tabs (Nebivolol Hcl) .... One Daily  Allergies (verified): No Known Drug Allergies  Past History:  Past Medical History: Reviewed history from 04/28/2008 and no changes required. Hyperlipidemia Hypertension Pulmonary Nodules, not seen on plain cxr...........................................Marland KitchenByrum     - First detected 10/07     - RE cT 10/08      - Pos IPPD > 15 mm 12/09     - FOB/lavage 04/20/08 neg afb smear  Past Surgical History: Tubal ligation-17 yrs ago Endometrial ablation-4-5 yrs ago  Review of Systems       As stated in the HPI and negative for all other systems.   Vital Signs:  Patient profile:   45 year old female Height:      68 inches Weight:      156 pounds BMI:     23.81 Pulse rate:   55 / minute BP sitting:   154 / 91  (right arm)  Vitals Entered By: Oswald Hillock (May 23, 2009 2:05 PM)  Physical Exam  General:  Well developed, well nourished, in no acute distress. Head:  normocephalic and atraumatic Eyes:  PERRLA/EOM intact; conjunctiva and lids  normal. Mouth:  Teeth, gums and palate normal. Oral mucosa normal. Neck:  Neck supple, no JVD. No masses, thyromegaly or abnormal cervical nodes. Chest Wall:  no deformities or breast masses noted Lungs:  Clear bilaterally to auscultation and percussion. Heart:  Non-displaced PMI, chest non-tender; regular rate and rhythm, S1, S2 without murmurs, rubs or gallops. Carotid upstroke normal, no bruit. Normal abdominal aortic size, no bruits. Femorals normal pulses, no bruits. Pedals normal pulses. No edema, no varicosities. Abdomen:  Bowel sounds positive; abdomen soft and non-tender without masses, organomegaly, or hernias noted. No hepatosplenomegaly. Msk:  Back normal, normal gait. Muscle strength and tone normal. Pulses:  pulses normal in all 4 extremities Extremities:  No clubbing or cyanosis. Neurologic:  Alert and oriented x 3. Skin:  Intact without lesions or rashes. Psych:  Normal affect.   EKG  Procedure date:  05/23/2009  Findings:      sus bradycardia, rate 55, axis within normal limits, intervals within normal limits, no acute ST-T wave changes.  Impression & Recommendations:  Problem # 1:  CHEST PAIN (ICD-786.50) She is not bothered by this or the palpitations particularly any longer. I do not suspect a cardiac etiology for the chest pain. At this point no further cardiovascular testing is suggested.  Orders: EKG w/ Interpretation (93000)  Problem # 2:  HYPERTENSION (ICD-401.9) Her blood pressure is slightly elevated. I asked her to keep a blood pressure diary. I asked  her to avoid salt. She will drop the diary often about 3 months and I will suggest changes as needed based on these results.  Patient Instructions: 1)  Your physician recommends that you schedule a follow-up appointment as needed 2)  Your physician recommends that you continue on your current medications as directed. Please refer to the Current Medication list given to you today.

## 2010-06-07 NOTE — Progress Notes (Signed)
Summary: Needs a Note   Phone Note Outgoing Call Call back at [phone]   Call placed by: Trixie Dredge,  February 22, 2010 12:25 PM Call placed to: Patient Summary of Call: Advised pt that Dr. Tawanna Cooler is unable to complete FMLA forms.  Pt states that since Dr. Tawanna Cooler infomed her to take fiber supplement it causes her to use the restroom frequently while at work.  She needs a note stating this. Initial call taken by: Trixie Dredge,  February 22, 2010 12:27 PM  Follow-up for Phone Call        this patient was referred to GI for evaluation of the GI problems.  Any notes pertaining  to her bowel movements should come from the gastroenterologist Follow-up by: Roderick Pee MD,  February 22, 2010 12:44 PM  Additional Follow-up for Phone Call Additional follow up Details #1::        patient has not been to GI recently and can not go back to the GI doctor seen before. Additional Follow-up by: Kern Reap CMA Duncan Dull),  February 23, 2010 8:51 AM  New Problems: ABDOMINAL PAIN, GENERALIZED (ICD-789.07)   Additional Follow-up for Phone Call Additional follow up Details #2::    follow-up with GI Follow-up by: Roderick Pee MD,  February 23, 2010 9:10 AM  New Problems: ABDOMINAL PAIN, GENERALIZED (ICD-789.07)

## 2010-06-07 NOTE — Progress Notes (Signed)
Summary: Pt is needing to get a referral to [facility]  Phone Note Call from Patient Call back at [facility] Phone [phone]   Caller: Patient Summary of Call: Pt called and is needing to get referral to [facility], re: surgery pt had done by Dr Marina Goodell. Pt having complications from surgery. Pls send referral to [facility]  fax [phone] Charlyne Mom.  Pls also include office notes from pts last cpx with Dr Tawanna Cooler.  Initial call taken by: Lucy Antigua,  May 25, 2010 3:05 PM  Follow-up for Phone Call         Fleet Contras please call and find out what is going on......... according to the notes, she never even went to see Dr. Marina Goodell???????? Follow-up by: Roderick Pee MD,  May 25, 2010 6:11 PM  Additional Follow-up for Phone Call Additional follow up Details #1::        Phone Call Completed Additional Follow-up by: Kern Reap CMA Duncan Dull),  May 28, 2010 1:54 PM  New Problems: CONSTIPATION (ICD-564.00) HEMORRHOIDS (ICD-455.6)   Additional Follow-up for Phone Call Additional follow up Details #2::    patient does not want to see Dr Marina Goodell.     New Problems: CONSTIPATION (ICD-564.00) HEMORRHOIDS (ICD-455.6)

## 2010-06-07 NOTE — Op Note (Signed)
Summary: Operative Report       NAME:  [name], [name]              ACCOUNT NO.:  [id number]      MEDICAL RECORD NO.:  [id number]          PATIENT TYPE:  INP      LOCATION:  1239                         FACILITY:  [facility]      PHYSICIAN:  Gabrielle Dare. Janee Morn, M.D.DATE OF BIRTH:  Apr 04, 1966      DATE OF PROCEDURE:  06/16/2009   DATE OF DISCHARGE:                                  OPERATIVE REPORT      PREOPERATIVE DIAGNOSIS:  Bleeding after hemorrhoidectomy.      POSTOPERATIVE DIAGNOSIS:  Bleeding after hemorrhoidectomy.      PROCEDURE:   1. Examination under anesthesia.   2. Suture ligation of hemorrhage from hemorrhoidectomy.      SURGEON:  Gabrielle Dare. Janee Morn, MD      ANESTHESIA:  General endotracheal.      Intraoperative laboratory studies revealed hemoglobin of 6.8.  Type and   cross was sent.      HISTORY OF PRESENT ILLNESS:  Ms. [name] is a 45 year old African   American female who underwent hemorrhoidectomy on June 05, 2009.  She   was admitted on June 14, 2009, after passing some blood per rectum   and watched overnight.  This resolved and she was sent yesterday however   she had another bloody bowel movement and returned today.  She was   admitted to hospital and while her hemoglobin was 10 earlier today she   passed some more blood per rectum, so she was taken emergently to the   operating room for examination under anesthesia with planned suture   ligation of bleeding site.      PROCEDURE IN DETAIL:  Informed consent was obtained.  The patient was   identified in the preop holding area.  She received intravenous   antibiotics.  She was brought the operating room.  General endotracheal   anesthesia was administered by the anesthesia staff.  She was placed in   lithotomy position.  Perianal area was prepped and draped in sterile   fashion.  The perianal region was infiltrated with 0.5% Marcaine with   epinephrine.  Digital rectal exam revealed some liquid blood  present in   the rectal vault.  This was suctioned out and a retractor was placed and   we were able to visualize the small arterial bleeder at the midportion   of her hemorrhoidectomy site.  This area was suture ligated with figure-   of-eight 2-0 Vicryl.  We then subsequently placed several interrupted   figure-of-eight 2-0 Vicryls to obtain hemostasis and close down about   75% of the internal portion of the hemorrhoidectomy site.  After several   figure-of-eight sutures were placed, there was excellent hemostasis.   The area was irrigated and pressure was held for several minutes.  Area   was rechecked and there was some small bleeding more distally.   Additional figure-of-eight sutures placed achieving excellent   hemostasis, this was observed for sometime in the operating room.  After   holding pressure with gauze in the area remained  completely dry.   Circumferential and rectal examination was done and no other bleeding   sites or abnormalities were noted.  The suture line remained hemostatic.   At the end of the procedure, sponge, needle, and instrument counts were   correct.  The patient requested no indirect dressing of   Gelfoam, so we refrained from doing this, but we placed some sterile   gauze in the outer portion.  She tolerated procedure well without   apparent complications.      PLAN:  Transfuse 2 units of packed red blood cells and watch her very   closely in the step-down unit.               Gabrielle Dare Janee Morn, M.D.            BET/MEDQ  D:  06/16/2009  T:  06/17/2009  Job:  [id number]         Electronically Signed by Violeta Gelinas M.D. on 06/20/2009 04:51:58 PM

## 2010-07-22 LAB — BASIC METABOLIC PANEL
BUN: 8 mg/dL (ref 6–23)
CO2: 28 mEq/L (ref 19–32)
Chloride: 107 mEq/L (ref 96–112)
GFR calc Af Amer: >60 mL/min (ref >60)
Potassium: 4.2 mEq/L (ref 3.5–5.1)
Sodium: 140 mEq/L (ref 135–145)

## 2010-07-25 LAB — CBC
HCT: 27.5 % — ABNORMAL LOW (ref 36.0–46.0)
HCT: 28.6 % — ABNORMAL LOW (ref 36.0–46.0)
Hemoglobin: 9.1 g/dL — ABNORMAL LOW (ref 12.0–15.0)
MCHC: 34.7 g/dL (ref 30.0–36.0)
MCHC: 33.1 g/dL (ref 30.0–36.0)
MCHC: 35.1 g/dL (ref 30.0–36.0)
MCV: 90.2 fL (ref 78.0–100.0)
MCV: 91.3 fL (ref 78.0–100.0)
Platelets: 152 10*3/uL (ref 150–400)
Platelets: 270 10*3/uL (ref 150–400)
Platelets: 151 K/uL (ref 150–400)
Platelets: 202 10*3/uL (ref 150–400)
RBC: 3.01 MIL/uL — ABNORMAL LOW (ref 3.87–5.11)
RBC: 3.18 MIL/uL — ABNORMAL LOW (ref 3.87–5.11)
RDW: 13.9 % (ref 11.5–15.5)
WBC: 9.3 10*3/uL (ref 4.0–10.5)
WBC: 6.2 10*3/uL (ref 4.0–10.5)
WBC: 9.8 K/uL (ref 4.0–10.5)

## 2010-07-25 LAB — DIFFERENTIAL
Basophils Absolute: 0 10*3/uL (ref 0.0–0.1)
Basophils Relative: 0 % (ref 0–1)
Eosinophils Absolute: 0.1 10*3/uL (ref 0.0–0.7)
Monocytes Absolute: 0.4 10*3/uL (ref 0.1–1.0)
Monocytes Relative: 6 % (ref 3–12)

## 2010-07-25 LAB — COMPREHENSIVE METABOLIC PANEL
Alkaline Phosphatase: 60 U/L (ref 39–117)
BUN: 7 mg/dL (ref 6–23)
Calcium: 9.1 mg/dL (ref 8.4–10.5)
Creatinine, Ser: 0.89 mg/dL (ref 0.4–1.2)
Glucose, Bld: 100 mg/dL — ABNORMAL HIGH (ref 70–99)
Potassium: 3.7 mEq/L (ref 3.5–5.1)
Total Protein: 6.6 g/dL (ref 6.0–8.3)

## 2010-07-25 LAB — URINALYSIS, ROUTINE W REFLEX MICROSCOPIC
Bilirubin Urine: NEGATIVE
Hgb urine dipstick: NEGATIVE
Ketones, ur: NEGATIVE mg/dL
Nitrite: NEGATIVE
Protein, ur: NEGATIVE mg/dL
Urobilinogen, UA: 0.2 mg/dL (ref 0.0–1.0)

## 2010-07-25 LAB — BASIC METABOLIC PANEL
CO2: 25 mEq/L (ref 19–32)
Calcium: 8.6 mg/dL (ref 8.4–10.5)
Chloride: 105 mEq/L (ref 96–112)
Creatinine, Ser: 1.11 mg/dL (ref 0.4–1.2)
Glucose, Bld: 180 mg/dL — ABNORMAL HIGH (ref 70–99)
Potassium: 3.1 mEq/L — ABNORMAL LOW (ref 3.5–5.1)
Sodium: 138 mEq/L (ref 135–145)

## 2010-07-25 LAB — CROSSMATCH: Antibody Screen: NEGATIVE

## 2010-07-25 LAB — TYPE AND SCREEN: Antibody Screen: NEGATIVE

## 2010-07-25 LAB — PROTIME-INR
INR: 0.96 (ref 0.00–1.49)
INR: 1.06 (ref 0.00–1.49)
Prothrombin Time: 12.7 seconds (ref 11.6–15.2)
Prothrombin Time: 13.7 s (ref 11.6–15.2)

## 2010-07-25 LAB — POCT I-STAT 4, (NA,K, GLUC, HGB,HCT): HCT: 20 % — ABNORMAL LOW (ref 36.0–46.0)

## 2010-07-25 LAB — MRSA PCR SCREENING: MRSA by PCR: NEGATIVE

## 2010-07-31 ENCOUNTER — Encounter: Payer: Self-pay | Admitting: Family Medicine

## 2010-07-31 ENCOUNTER — Ambulatory Visit (INDEPENDENT_AMBULATORY_CARE_PROVIDER_SITE_OTHER): Payer: 59 | Admitting: Family Medicine

## 2010-07-31 VITALS — BP 150/90 | Temp 98.8°F | Ht 68.0 in | Wt 164.0 lb

## 2010-07-31 DIAGNOSIS — IMO0002 Reserved for concepts with insufficient information to code with codable children: Secondary | ICD-10-CM

## 2010-07-31 DIAGNOSIS — IMO0001 Reserved for inherently not codable concepts without codable children: Secondary | ICD-10-CM

## 2010-07-31 DIAGNOSIS — J45901 Unspecified asthma with (acute) exacerbation: Secondary | ICD-10-CM

## 2010-07-31 MED ORDER — PREDNISONE 20 MG PO TABS: ORAL_TABLET | ORAL | Status: DC

## 2010-07-31 MED ORDER — ZOLMITRIPTAN 2.5 MG PO TABS: 2.5000 mg | ORAL_TABLET | ORAL | Status: DC | PRN

## 2010-07-31 MED ORDER — LOSARTAN POTASSIUM-HCTZ 50-12.5 MG PO TABS: 1.0000 | ORAL_TABLET | Freq: Every day | ORAL | Status: DC

## 2010-07-31 NOTE — Patient Instructions (Signed)
Take 3 prednisone tablets now then starting tomorrow morning two tabs x 3 days, one x 3 days, a half x 3 days, then half a tablet Monday, Wednesday, Friday, for a two-week taper.  Return p.r.n.

## 2010-07-31 NOTE — Progress Notes (Signed)
  Subjective:    Patient ID: [name], female    DOB: [birth year], 45 y.o.   MRN: [id number]  HPIT. Is a 45 year old female, nonsmoker, who comes in today with a 4 day history of coughing and wheezing.  She typically has allergic rhinitis.  All year round, however, it's always worse in the spring time.  She's had allergy symptoms for a couple weeks and on Sunday.  This past weekend started to cough and wheeze.  She wheezed once in the past when she had pneumonia.  No fever, sputum production, etc.    Review of Systems    General and pulmonary review of systems otherwise negative Objective:   Physical Exam    Well-developed well-nourished, female, in no acute distress.  HEENT negative.  Neck supple.  No adenopathy.  Lungs are clear.  Breath sounds symmetrical.  Mild late expiratory wheezing    Assessment & Plan:  ,Asthma,,,, prednisone burst and taper.  Return p.r.n.

## 2010-08-06 ENCOUNTER — Telehealth: Payer: Self-pay | Admitting: *Deleted

## 2010-08-06 NOTE — Telephone Encounter (Signed)
[facility] Triage Call Report Triage Record Num: [id number] Operator: Elita Boone Patient Name: [name] Call Date & Time: 08/04/2010 9:44:44AM Patient Phone: [phone] PCP: Eugenio Hoes. Todd Patient Gender: Female PCP Fax : [phone] Patient DOB: 1966-04-17 Practice Name: [facility] Reason for Call: Pt calling to report that she is having itching on arms and legs. Pt has started on predisone and reports that shin is red. Onset 03/29. Home care advice given for allergic reaction. No emergent s/s. Protocol(s) Used: Allergic Reaction, Severe Recommended Outcome per Protocol: Provide Home/Self Care Reason for Outcome: All other situations Care Advice: ~ Recurring allergic problems should be evaluated by an provider. ~ SYMPTOM / CONDITION MANAGEMENT Nonprescription oral antihistamines (such as Benadryl, Allerest, Chlor-Trimetron, etc.) may help relieve symptoms. - Use as directed on package label or by pharmacist. - Antihistamine medication may cause drowsiness and should be taken with caution by adults 75 years or older. Consider a non-sedating antihistamine such as Claritin now available without a prescription. ~ Call EMS 911 if develop signs and symptoms of anaphylaxis within minutes to several hours of exposure: severe difficulty breathing; rapid, weak or irregular pulse; pruritus, urticaria, swelling of face, lips, tongue, or throat causing tightness or difficulty swallowing; abdominal cramping, nausea, vomiting or diarrhea. ~ 08/04/2010 9:51:35AM Page 1 of 1 [email or web address]

## 2010-09-18 NOTE — Assessment & Plan Note (Signed)
[facility]                             PULMONARY OFFICE NOTE   [name], [name]                     MRN:          [id number]  DATE:02/19/2007                            DOB:          December 01, 1965    PULMONARY FINAL FOLLOWUP OFFICE VISIT   HISTORY:  A 45 year old black female never smoker seen on July 18 for  intermittent chest pain, chronic unexplained dyspnea, and an abnormal CT  scan.   I thought she probably had irritable bowel syndrome causing her pain,  but she tells me that it is the same as it has been for months.  It  turns out the same is that she has chest pain off and on no more than  once a week not related to activity or position, and had a thorough  evaluation by Dr. Antoine Poche for cardiac etiology that was negative.  She  denies any reproducibility with exertion, swallowing, or coughing.   PHYSICAL EXAMINATION:  She is a depressed ambulatory black female in no  acute distress.  VITAL SIGNS:  Stable vital signs.  SHE is 148 pounds, which is no change  from baseline.  HEENT:  Unremarkable.  Oropharynx is clear.  LUNGS:  The lung fields are completely clear bilaterally to auscultation  and percussion.  CARDIAC:  Regular rate and rhythm without murmur, gallop, or rub.  ABDOMEN:  Soft and benign.  EXTREMITIES:  Warm without calf tenderness, cyanosis, clubbing, or  edema.   Chest CT scan was reviewed from November 04, 2006 and is unchanged since  October 2007 with multiple tiny pulmonary nodules.   IMPRESSION:  1. Multiple tiny pulmonary nodules that can only be seen on chest CT      scan and are not likely at all to be related to the intermittent      chest pain that she is describing.  2. She either has irritable bowel syndrome or reflux.  I have      recommended a reflux diet and I gave her Nexium samples for 10      days, along with maintenance Prilosec, to be taken before meals,      once the nexium samples run out.  3. I do  note that she has a hopeless, helpless affect and attitude and      seems to focus on a CT scan that is most likely nothing more than      an incidental finding in terms of explaining her symptoms, or being      a threat to her health.   Because the nodules are multiple in number, they most likely represent  non-calcified granulomas and have not evolved over a year's time.  No  further followup is needed unless there is macroscopic evolution of  nodular disease that can be detected on cxr or symptoms that might  correlate with the CT only findings.    [name] Dalton. Sherene Sires, MD, [facility]  Electronically Signed   MBW/MedQ  DD: 02/19/2007  DT: 02/20/2007  Job #: [id number]

## 2010-09-18 NOTE — Assessment & Plan Note (Signed)
[facility]                            CARDIOLOGY OFFICE NOTE   YERALDY, [name]                     MRN:          [id number]  DATE:11/05/2006                            DOB:          Sep 10, 1965    PRIMARY CARE PHYSICIAN:  Molly Maduro Day, MD   REASON FOR PRESENTATION:  Evaluate the patient with shortness of breath,  pulmonary nodules and chest pain.   HISTORY OF PRESENT ILLNESS:  The patient presents for her second visit.  In October, I admitted her for rule out myocardial infarction.  She had  a cardiac CT which had a negative calcium score, but did have some mild  plaque at the origin of her first diagonal.  She did have some right  lower and middle lobe lung nodules.  We had a hard time getting hold of  her to have follow up of this.  She finally had that test yesterday  which was appropriate timing.  The nodules that had been identified  previously were of stable size.  However, she had more of a right lung  image, and there were some right upper lobe nodules that need further  follow up.   The patient continues to get chest discomfort.  It happens sporadically.  It can happen with activity.  It does not happen every time she does  stuff.  It is dull and substernal.  She still has some radiation to her  left arm.  It goes away spontaneously.  She does not have any associated  symptoms.  However, she is having more dizziness, particularly when she  goes to walk a little bit.  It is not happening every time.  She is not  having any resting dizziness, presyncope or syncope.  She has not  actually lost consciousness.  She is getting more dyspneic with activity  than she did previously.   Recently, she had a significant headache with some of her dizziness and  was treated with Toradol and Phenergan.  Because of her chest  discomfort, she was recently stopped from Vytorin to see if this would  help.  However, she continues to have these  complaints.   PAST MEDICAL HISTORY:  1. Nonobstructive mild coronary plaque as seen on cardiac CT.  2. Lung nodules of unclear etiology.  3. Significant dyslipidemia.  4. Endometrial ablation.  5. Tubal ligation.   CURRENT MEDICATIONS:  1. Azor 10/20 daily.  2. Xanax 1 mg p.r.n.   REVIEW OF SYSTEMS:  As stated in the HPI and otherwise negative for  other systems.   PHYSICAL EXAMINATION:  GENERAL:  The patient is in no distress.  She has  a rather flat affect.  VITAL SIGNS:  Blood pressure 122/92, heart rate 71 and regular .  HEENT:  Eyes unremarkable.  Pupils equal, round and reactive to light.  Fundi not visualized.  Oral mucosa unremarkable.  NECK:  No jugular venous distention.  Wave form within normal limits.  Carotid upstrokes brisk and symmetric.  No bruits.  No thyromegaly.  LYMPHATICS:  No cervical, axillary, inguinal adenopathy.  LUNGS:  Clear to auscultation bilaterally.  BACK:  No costovertebral angle tenderness.  CHEST:  Unremarkable.  HEART:  PMI not displaced or sustained.  S1, S2 within normal limits.  No S3, S4, clicks, rubs, murmurs.  ABDOMEN:  Flat, positive bowel sounds.  Normal in frequency, pitch.  No  bruits, rebound, guarding.  No midline pulses or mass.  No hepatomegaly  or splenomegaly.  SKIN:  No rash or nodules.  EXTREMITIES:  Pulses 2+ throughout.  No cyanosis, clubbing or edema.  NEUROLOGICAL:  Oriented to person, place and time.  Cranial nerves II-  XII grossly intact.  Motor grossly intact.   STUDIES:  EKG sinus rhythm, premature ventricular contractions in a  trigeminal pattern.  No acute ST-T wave changes.   ASSESSMENT/PLAN:  1. Dizziness.  I wonder if this is primarily a rhythm problem. I am      going to get a two week event monitor.  She has had labs to include      TSH within the last few months and I will not repeat these.  2. Lung nodules.  She has this finding on CT as well as a complaint of      dyspnea.  I am going to send her to  a pulmonologist for follow up.      I am also going to check a BMP level.  3. Dyslipidemia.  Given the small amount of plaque she had at her      young age and her family history, I would be very aggressive with      this lipid.  I have discussed this with her.  She understands that      this is without any trial data to support this.  I have taken the      liberty of putting her back on Vytorin 10/40.  I will defer follow      up to Dr .Morrie Sheldon.  I suggest an LDL less than 100 which again is      aggressive therapy.  4. Follow up.  I would like to see her back after the event monitor or      sooner if needed.     Rollene Rotunda, MD, [facility]  Electronically Signed    JH/MedQ  DD: 11/05/2006  DT: 11/06/2006  Job #: [id number]   cc:   Alfredia Client, MD

## 2010-09-18 NOTE — Procedures (Signed)
[facility]                              EXERCISE TREADMILL   TRYSTEN, BERTI                     MRN:          [id number]  DATE:10/30/2007                            DOB:          1965-11-23    PRIMARY CARE PHYSICIAN:  Molly Maduro Day, MD   REASON FOR THE PROCEDURE:  Exercise treadmill test.   INDICATIONS:  Evaluate the patient with chest pain.   PROCEDURE NOTE:  The patient was exercised using standard Bruce  protocol.  She is able to exercise for 9 minutes which completed stage  III.  She achieved 10.1 METs.  She achieved a target heart rate of her  peak heart rate of 162 which was 90% of predicted.  She started out  slightly hypertensive, but had an appropriate mild blood pressure  increased with a max of 189/97.  She had frequent ventricular ectopy at  rest which she has been having.  It actually went away with peak  exercise.  There were no ischemic ST-T wave changes.  She had a normal  heart rate recovery.   CONCLUSION:  Negative adequate exercise treadmill test.  There were no  inducible arrhythmias.  She did have some mild chest discomfort that  started this that did not get worse with exertion.  There are no  ischemic ST-T wave changes.   CONCLUSION:  Based on the above and her previous workup, I do not see an  indication that her chest pain was cardiac.  It is not related to the  ectopy.  I do not think she is having any symptoms related to this.  Certainly, I will be happy to re-evaluate this.  I have suggested that  she follow with Dr. Morrie Sheldon for evaluation of nonanginal chest pain.  Of  note, the patient did have some mild coronary plaque on a previous  cardiac CT and so does need risk reduction.  She also does have  pulmonary nodules.  I do not know that this is related to her pain, but  I have referred her back to a pulmonologist within our practice.  She  was to have a follow-up after her last appointment.  I would like to see  her back in about 3 months to reevaluate her arrhythmia.     Rollene Rotunda, MD, [facility]  Electronically Signed    JH/MedQ  DD: 10/30/2007  DT: 10/31/2007  Job #: [id number]   cc:   Alfredia Client, MD

## 2010-09-18 NOTE — Assessment & Plan Note (Signed)
[facility]                            CARDIOLOGY OFFICE NOTE   LOANNE, EMERY                     MRN:          [id number]  DATE:09/08/2007                            DOB:          Nov 03, 1965    PRIMARY:  Dr. Molly Maduro Day.   REASON FOR PRESENTATION:  Evaluate patient with chest pain and  palpitations.   HISTORY OF PRESENT ILLNESS:  The patient is 45 years old.  She presents  for follow-up of the above.  She has had some mild coronary plaque noted  in the past.  She had a cardiac CT for evaluation of chest discomfort.  She is not thought to have any obstructive coronary disease.  She went  and to the emergency room on July 26, 2007, with chest pain and  palpitations.  She was noted to have premature ventricular contractions.  She was thought to have chest wall pain.  She was treated with Aleve.  She did not have any significant improvement in her chest wall pain.  She subsequently went to see Dr. Morrie Sheldon and was treated with metoprolol 25  mg b.i.d. and naproxen sodium.  She says she is still getting discomfort  and PVCs.  She says she notices the chest pain at night.  This is with  movement.  She will be stretching or rolling over.  She will get a mid  chest discomfort.  It will last for about 30 seconds and is sharp.  It  the same as previous.  It does not happen every night.  He goes away on  its own.  There is no radiation to her neck or to her arms.  There is no  associated nausea, vomiting, diaphoresis or shortness of breath.  She  says she will get some palpitations when she is exercising, but not at  rest.  When she is exercising, such as on the treadmill or walking  outside at night which she tries to routinely, she does not get this  chest discomfort.   PAST MEDICAL HISTORY:  Nonobstructive mild coronary plaque seen on  cardiac CT, lung nodules, noncalcified granulomas most likely, followed  by Dr. Sherene Sires, dyslipidemia, endometrial  ablation, tubal ligation.   ALLERGIES/INTOLERANCES:  NONE.   CURRENT MEDICATIONS:  1. Azor 10/20.  2. Lovastatin 20 mg daily.  3. Metoprolol 25 mg b.i.d.  4. Zyrtec 10 mg daily.   REVIEW OF SYSTEMS:  As stated in the HPI and otherwise negative for  other systems.   PHYSICAL EXAMINATION:  GENERAL:  The patient is in no distress.  VITAL SIGNS:  Blood pressure 129/90, heart rate 74 and regular, weight  156 pounds.  HEENT:  Eyelids unremarkable.  Pupils equal, round and reactive to  light.  Fundi not visualized.  Oral mucosa unremarkable.  NECK:  No jugulovenous distention at 45 degrees, carotid upstroke brisk  and symmetrical.  No bruits or thyromegaly.  LYMPHATICS:  No cervical, axillary or inguinal adenopathy.  LUNGS:  Clear to auscultation bilaterally.  BACK:  No costovertebral angle tenderness.  CHEST:  Unremarkable.  HEART:  PMI not displaced or sustained.  S1-S2 within normal limits.  No  S3-S4.  No clicks, no rubs, no murmurs.  ABDOMEN:  Flat.  Positive bowel sounds, normal in frequency and pitch.  No bruits, no rebound, guarding or midline pulsatile mass.  No  hepatomegaly or splenomegaly.  SKIN:  No rashes, no nodules.  EXTREMITIES:  2+ pulses throughout.  No edema, cyanosis or clubbing.  NEURO:  Oriented to person, place and time.  Cranial nerves II-XII  grossly intact.  Motor grossly intact.   EKG; sinus rhythm, rate 77, axis within normal limits, intervals within  normal limits, no acute ST-T wave changes.   ASSESSMENT/PLAN:  1. Chest discomfort.  The patient's chest discomfort probably is chest      wall and musculoskeletal based on her description.  At this point,      I am going to increase her anti-inflammatories by having her take      800 mg of Motrin three times a day for the next 10 days.  She will      stop this if she gets any stomach upset.  2. Palpitations.  The patient is having more palpitations with      activity than at rest.  I am going to put her  on a treadmill and do      of a POET (plain old exercise treadmill).  This will allow me to      further evaluate her previous nonobstructive coronary plaque and      also look for the PVCs.  3. Hypertension.  Blood pressure is controlled and she will continue      medications as listed.  4. Dyslipidemia.  This was recently followed by Dr. Morrie Sheldon, and she was      put back on lovastatin.  She had come off of simvastatin when she      lost her insurance.   FOLLOW-UP:  I will see her at the time of her POET.     Rollene Rotunda, MD, [facility]  Electronically Signed    JH/MedQ  DD: 09/08/2007  DT: 09/08/2007  Job #: [id number]   cc:   Alfredia Client, MD

## 2010-09-18 NOTE — Op Note (Signed)
NAME:  SOMMER, SPICKARD              ACCOUNT NO.:  [id number]   MEDICAL RECORD NO.:  [id number]          PATIENT TYPE:  AMB   LOCATION:  CARD                         FACILITY[facility]  [facility]   PHYSICIAN:  Leslye Peer, MD    DATE OF BIRTH:  [birth year]   DATE OF PROCEDURE:  04/20/2008  DATE OF DISCHARGE:                               OPERATIVE REPORT   PROCEDURE:  Fiberoptic bronchoscopy with bronchoalveolar lavage.   OPERATOR:  Leslye Peer, MD   INDICATIONS FOR PROCEDURE:  Pulmonary nodules with positive PPD.   MEDICATIONS GIVEN:  Fentanyl 100 mcg IV in divided doses, Versed 3 mg IV  in divided doses, Lidocaine 1% to the bronchoalveolar tree 20 mL total.   CONSENT:  Informed consent was obtained from the patient and signed copy  is on her hospital chart.   PROCEDURE IN DETAIL:  After consent was obtained as outlined above  conscious sedation was initiated.  The patient was initially slightly  hypertensive prior to initiation of sedation but her blood pressure  improved quickly and remained stable throughout the case.  The  bronchoscope was introduced through the right naris without difficulty.  The posterior pharynx, glottis and cords were all normal.  Local  anesthesia was achieved with 1% lidocaine.  The trachea was intubated  and airway inspection was performed.  The trachea and main carina were  normal.  The right mainstem bronchus, right upper lobe bronchi, bronchus  intermedius, right middle lobe and right lower lobe airways were all  within normal limits without abnormal secretions or endobronchial  lesions.  The left-sided exam was then performed.  The left mainstem,  left upper lobe, lingular and left lower lobe bronchi were all  inspected.  Again, there were no endobronchial lesions or abnormal  secretions.  Bronchoalveolar lavage was performed in the right upper  lobe in the areas of the patient's pulmonary nodules on CT scan; 60 mL  of normal saline was instilled in the  anterior segment of the right  upper lobe and collected for AFB smear and culture.  A second BAL was  performed on the apical subsegment of the right upper lobe.  The  bronchoalveolar lavage fluid was pooled and approximately 30 mL total  was obtained for microbiology.  The patient tolerated the procedure  well.  There were no obvious complications.  There was no blood loss.  She returned to the recovery room in good condition.   SAMPLES:  Bronchoalveolar lavage from the right upper lobe anterior and  apical segments which will be sent for bacterial, fungal and AFB  culture.   PLANS:  I will review the results of the microbiology with Ms. Hebb  when they become available.      Leslye Peer, MD  Electronically Signed     RSB/MEDQ  D:  04/20/2008  T:  04/21/2008  Job:  [id number]

## 2010-09-18 NOTE — Assessment & Plan Note (Signed)
[facility]                             PULMONARY OFFICE NOTE   ARMENIA, SILVERIA                     MRN:          [id number]  DATE:11/21/2006                            DOB:          1965/05/26    REASON FOR CONSULTATION:  Chest pain/lung nodules.   HISTORY:  A 45 year old black female, never smoker, with chest pain off  and on for a year.  The pain typically occurs maybe once a week,  lasts between minutes and hours, and has not really increased in  intensity or frequency since the onset.  It is not made worse by  coughing, deep breathing, is not associated with any nausea, vomiting,  or diaphoresis, not brought on by eating or related to bowel movements.  It is not brought on by exertion or related to eating or swallowing, or  relieved by belching.  She has undergone a cardiac workup which is  negative.  Part of the workup was a CT scan showing multiple pulmonary  nodules, and, therefore, I am seeing her at Dr. Jenene Slicker request.   The patient denies any unexplained weight loss, cough, or dyspnea,  fevers, chills, sweats, myalgias, arthralgias, or history of  rheumatologic disease or TB exposure.   PAST MEDICAL HISTORY:  Significant for tubal ligation, hypertension,  hyperlipidemia.   ALLERGIES:  None known.   SOCIAL HISTORY:  She has never smoked.   FAMILY HISTORY:  Recorded in detail and significant for the absence of  respiratory diseases or atopy.   REVIEW OF SYSTEMS:  Taken in detail and negative except as outlined  above.   PHYSICAL EXAMINATION:  This is a pleasant, ambulatory black female in no  acute distress.  She is afebrile with normal vital signs.  HEENT:  Unremarkable.  OROPHARYNX:  Clear.  NECK:  Supple without cervical adenopathy or tenderness.  Trachea is  midline.  No thyromegaly.  Lung fields are perfectly clear bilaterally to auscultation and  percussion.  Regular rhythm without murmurs, gallops, or rubs.  ABDOMEN:  Soft, benign.  EXTREMITIES:  Warm without calf tenderness, cyanosis, clubbing, or  edema.   IMPRESSION:  1. Chronic, nonspecific chest pain with no evidence of cardiac or      pulmonary etiology.  This leaves the GI tract as the number one      suspect.  I suspect it is either gastroesophageal reflux disease      or gas related, and, therefore, recommended a gastroesophageal      reflux disease diet plus avoidance of gas-producing foods and a      trial of Citrucel 1 tsp b.i.d.  2. Her multiple pulmonary nodules are tiny and probably cannot be seen      on the plain films.  They apparently were noted in October of 2007      but are unchanged by the CT scan I reviewed from July 2008.  A      plain film in 3 months then again in 6 months and thereafter yearly      would be all that I recommend given the likelihood that these are  long-standing, microscopic nodules of no clinical significance.      Should she develop unintended weight loss or unexplained cough or      any kind of rheumatologic complaints, I would consider additional      workup which would probably initially include a sed rate and an      angiotensin-converting enzyme level, but I think this is not      necessary at this point.  I reviewed with the patient the issue of      heightened sensitivity that is occurring on our new generation CT      scanners, showing nodules that have no clinical or plain film      correlation.  Since they are multiple and unchanged for 6 months,      it is somewhat reassuring that they are probably most likely      benign, but proving that would not be worth putting her through any      kind of biopsy at this point.     Charlaine Dalton. Sherene Sires, MD, [facility]  Electronically Signed    MBW/MedQ  DD: 11/21/2006  DT: 11/22/2006  Job #: [id number]   cc:   Rollene Rotunda, MD, [facility]  Alfredia Client, MD

## 2010-09-21 NOTE — Op Note (Signed)
NAME:  [name], [name] NO.:  [id number]   MEDICAL RECORD NO.:  [id number]          PATIENT TYPE:  AMB   LOCATION:  SDC                           FACILITY:  [facility]   PHYSICIAN:  Osborn Coho, M.D.   DATE OF BIRTH:  1966/02/24   DATE OF PROCEDURE:  02/02/2004  DATE OF DISCHARGE:                                 OPERATIVE REPORT   PREOPERATIVE DIAGNOSES:  Menometrorrhagia.   POSTOPERATIVE DIAGNOSES:  Menometrorrhagia.   PROCEDURE:  1.  Hysteroscopy.  2.  D&C.  3.  Endometrial ablation with Novasure.   ANESTHESIA:  General.   ATTENDING PHYSICIAN:  Osborn Coho, M.D.   FLUIDS:  1000 mL.   ESTIMATED BLOOD LOSS:  Minimal less than 10 mL.   URINE OUTPUT:  Straight cath prior to procedure.   COMPLICATIONS:  None.   FINDINGS:  Questionable polyp on posterior wall.  Uterus sounded to 10 cm  with cervical length of 3 cm and cavity length of 7 cm.  Cavity width was  4.2 cm.  Power was 150 watts and time 1 minute and 2 seconds.   DESCRIPTION OF PROCEDURE:  The patient was taken to the operating room after  the risks, benefits and alternatives were discussed with the patient and the  patient verbalized understanding and consent signed and witnessed. The  patient was placed under general anesthesia and prepped and draped in a  normal sterile fashion.  A total of 10 mL of 1% Lidocaine were used for a  paracervical block.  Paracervical block was administered and cervix measured  to 3 cm.  The uterus sounded to 10 cm.  Cavity length as noted above.  A  tenaculum was placed on the anterior lip of the cervix and cervix dilated  for passage of the diagnostic hysteroscope.  Hysteroscopy was performed and  finding as noted above. The hysteroscope was removed and Novasure instrument  introduced.  The cavity assessment was successful and ablation was  performed.  The Novasure instrument was removed.  The diagnostic  hysteroscope was reintroduced and visualization of the  uterine cavity after  ablation was noted.  The entire cavity  appeared to be ablated successfully. The hysteroscope was removed as well as  the remaining instruments.  Pressure was applied to the right tenaculum site  for hemostasis. Sponge continue was correct. The patient tolerated the  procedure well and was awaiting extubation and transfer to the recovery  room.      AR/MEDQ  D:  02/02/2004  T:  02/02/2004  Job:  [id number]

## 2010-09-21 NOTE — Consult Note (Signed)
[name], [name] NO.:  [id number]   MEDICAL RECORD NO.:  [id number]          PATIENT TYPE:  INP   LOCATION:  3737                         FACILITY:  [facility]   PHYSICIAN:  Rollene Rotunda, MD, FACCDATE OF BIRTH:  08-01-1965   DATE OF CONSULTATION:  02/05/2006  DATE OF DISCHARGE:  02/06/2006                                   CONSULTATION   PRIMARY CARE PHYSICIAN:  Dr. Molly Maduro Day.   REASON FOR PRESENTATION:  Evaluate patient with chest pain.   HISTORY OF PRESENT ILLNESS:  The patient is a new the patient added to my  schedule today.  She has had chest discomfort for about 3 months.  This  happens 2-3 times per week.  It is severe when she gets it.  She describes  it as a dull substernal discomfort radiating to her left arm.  It may last  for a few to several minutes.  It seems to be increasing in intensity and  frequency.  It goes away by itself.  She has used sublingual nitroglycerin  before which was helpful with the discomfort.  She did have an exercise  treadmill test on August 22nd.  This demonstrated no evidence of ST changes.  She went for about 7 minutes and did reach target heart rate.  However,  again, the symptoms have increased in severity in frequency since that time.  She does not report associated nausea, vomiting or diaphoresis.  Again, it  does radiate down her left arm.  She denies any PND or orthopnea.  She has  had no palpitations.  No presyncope or syncope.   The patient has had severe dyslipidemia (LDL 168, HDL 31, triglycerides  112), hypertension.   PAST SURGICAL HISTORY:  Endometrial ablation, tubal ligation.   ALLERGIES:  None.   MEDICATIONS:  1. Lotrel 5/10.  2. Xanax.   SOCIAL HISTORY:  The patient is single.  She has a 43 year old and 16-year-  old child.  She does office work.  She has never smoked cigarettes.   FAMILY HISTORY:  Family history is dramatic.  She does not know her father's  history very carefully, but there  is a report of early coronary disease in  his 69s.  She does have a sister who had type 1 diabetes mellitus.  She had  bypass in her 75s and died of a myocardial infarction at age 59.   REVIEW OF SYSTEMS:  As stated in the HPI and negative for other systems.   PHYSICAL EXAMINATION:  GENERAL:  The patient is in no distress.  VITAL SIGNS:  Blood pressure 114/82, heart rate 75 and regular, weight 142  pounds, body mass index 21.  HEENT:  Eyes unremarkable, pupils equal, round, reactive to light.  Fundi  not visualized.  Oral mucosa unremarkable.  NECK:  No jugular distension.  Wave form within normal limits.  Carotid  upstroke brisk and symmetric.  No bruits.  No thyromegaly.  LYMPHATICS:  No cervical, axillary, or inguinal adenopathy.  LUNGS:  Clear to auscultation bilaterally.  BACK:  No costovertebral angle tenderness.  CHEST:  Unremarkable.  HEART:  PMI not displaced or sustained, S1 and S2 within normal.  No S3, no  S4, no murmurs.  ABDOMEN:  Flat, positive bowel sounds, normal in frequency and pitch.  No  bruits.  No rebound, guarding.  No midline pulsatile mass.  Hepatomegaly.  No splenomegaly.  SKIN:  No rashes.  No nodules.  EXTREMITIES:  2+ pulses throughout.  No edema, no cyanosis or clubbing.  NEUROLOGIC:  Oriented to person, place, and time.  Cranial nerves II-XII  grossly intact.  Motor grossly intact.   ELECTROCARDIOGRAM:  EKG sinus rhythm, rate 75, axis within normal limits,  intervals within normal limits, no acute ST-T wave changes.   ASSESSMENT/PLAN:  1. Chest.  The patient's chest discomfort is worrisome.  It has been      progressive and is currently unstable.  She is having pain.  She has      had some mild pain today.  She is currently pain free.  I think the      pretest probability of obstructive coronary disease is at least      moderate.  This is based on her dyslipidemia and her family history      predominantly.  I'm going to put her in the hospital  today.  She is      going to be ruled out for myocardial infarction and watched overnight.      Provided her enzymes are okay, I'm going to schedule a cardiac CT in      the morning.  2. Dyslipidemia.  The level of aggressiveness with her lipids will be      based on any plaque or the degree of plaque evident on CT.  3. Hypertension.  She will continue on the Lotrel.  She will be getting a      beta-blocker tonight for heart rate control with her CT.           ______________________________  Rollene Rotunda, MD, [facility]     JH/MEDQ  D:  02/05/2006  T:  02/06/2006  Job:  [id number]   cc:   Dr. Molly Maduro Day

## 2010-09-21 NOTE — Discharge Summary (Signed)
[name], [name]              ACCOUNT NO.:  [id number]   MEDICAL RECORD NO.:  [id number]          PATIENT TYPE:  INP   LOCATION:  3737                         FACILITY:  [facility]   PHYSICIAN:  Noralyn Pick. Eden Emms, MD, FACCDATE OF BIRTH:  [birth year]   DATE OF ADMISSION:  02/05/2006  DATE OF DISCHARGE:  02/06/2006                           DISCHARGE SUMMARY - REFERRING   SUMMARY OF HISTORY:  [name] is a 45 year old African-American female who  presents with chest discomfort radiating into her left arm with an  increasing pattern at rest.  She was evaluated by Dr. Antoine Poche and admitted  for further evaluation.   Her history is notable for hypertension and hyperlipidemia.   LABORATORY DATA:  Admission weight was 140.5.  H&H 13.7, 40.8, normal  indices, platelets 272, wbc's 6.3.  Sodium 141, potassium 3.8, BUN 10,  creatinine 1.0, glucose 88, normal LFTs except for total bilirubin was  slightly elevated at 2.6.  CK-MB, relative index, and troponin were within  normal limits.  TSH was 2.920.  EKGs showed normal sinus rhythm, normal  axis, nonspecific ST-T wave  changes.  Subsequent EKG showed sinus  bradycardia, probably early repolarization.   HOSPITAL COURSE:  [name] was admitted to 4700 by Dr. Antoine Poche.  Overnight she continued to have some mild midsternal chest discomfort.  Enzymes and EKGs were negative for myocardial infarction.  Dr. Myrtis Ser  discussed with Dr. Antoine Poche and Dr. Eden Emms and a cardiac CT was performed.  Verbal report per Dr. Maurine Cane states that she did not have any evidence  of coronary artery disease and LV function was normal.  Dr. Eden Emms felt that  the patient could be discharged home with outpatient followup with Dr. Morrie Sheldon.   DISCHARGE DIAGNOSES:  1. Chest discomfort of uncertain etiology.  2. History of hypertension and hyperlipidemia.   DISPOSITION:  [name] is discharged home.  She is given permission to  return to work on February 10, 2006, without  restrictions.  She was asked to  maintain low salt/fat/cholesterol diet.  Activities were not restricted.  She was  asked to bring all her medications to all appointments.  She was asked to  continue her Lotrel HCT 5/10 mg p.o. daily and Xanax as previously.  She was  asked to arrange a 1- to 2-week followup with Dr. Morrie Sheldon.   Discharge time less than 30 minutes.     ______________________________  Joellyn Rued, PA-C    ______________________________  Noralyn Pick Eden Emms, MD, [facility]    EW/MEDQ  D:  02/06/2006  T:  02/07/2006  Job:  [id number]   cc:   Luis Abed, MD, [facility]  Primary Care Physician Dr. Morrie Sheldon

## 2010-09-24 ENCOUNTER — Encounter: Payer: Self-pay | Admitting: Internal Medicine

## 2010-09-24 ENCOUNTER — Ambulatory Visit (INDEPENDENT_AMBULATORY_CARE_PROVIDER_SITE_OTHER): Payer: 59 | Admitting: Internal Medicine

## 2010-09-24 DIAGNOSIS — R05 Cough: Secondary | ICD-10-CM

## 2010-09-24 DIAGNOSIS — R059 Cough, unspecified: Secondary | ICD-10-CM

## 2010-09-24 DIAGNOSIS — J45901 Unspecified asthma with (acute) exacerbation: Secondary | ICD-10-CM

## 2010-09-24 DIAGNOSIS — IMO0001 Reserved for inherently not codable concepts without codable children: Secondary | ICD-10-CM

## 2010-09-24 NOTE — Progress Notes (Signed)
  Subjective:    Patient ID: [name], female    DOB: [birth year], 45 y.o.   MRN: [id number]  HPI  45 year old patient who presents with a one-week history of nonproductive cough chest tightness sore throat intermittent low-grade fever. Denies any chest pain chills or purulent sputum production. Denies any shortness of breath or any active wheezing. She does have a history of allergic asthma exacerbations in the past    Review of Systems  Constitutional: Positive for fever and fatigue.  HENT: Positive for congestion. Negative for hearing loss, sore throat, rhinorrhea, dental problem, sinus pressure and tinnitus.   Eyes: Negative for pain, discharge and visual disturbance.  Respiratory: Positive for cough. Negative for shortness of breath and wheezing.   Cardiovascular: Negative for chest pain, palpitations and leg swelling.  Gastrointestinal: Negative for nausea, vomiting, abdominal pain, diarrhea, constipation, blood in stool and abdominal distention.  Genitourinary: Negative for dysuria, urgency, frequency, hematuria, flank pain, vaginal bleeding, vaginal discharge, difficulty urinating, vaginal pain and pelvic pain.  Musculoskeletal: Negative for joint swelling, arthralgias and gait problem.  Skin: Negative for rash.  Neurological: Negative for dizziness, syncope, speech difficulty, weakness, numbness and headaches.  Hematological: Negative for adenopathy.  Psychiatric/Behavioral: Negative for behavioral problems, dysphoric mood and agitation. The patient is not nervous/anxious.        Objective:   Physical Exam  Constitutional: She is oriented to person, place, and time. She appears well-developed and well-nourished. No distress.  HENT:  Head: Normocephalic.  Right Ear: External ear normal.  Left Ear: External ear normal.       Mild erythema of the oropharynx  Eyes: Conjunctivae and EOM are normal. Pupils are equal, round, and reactive to light.  Neck: Normal range of  motion. Neck supple. Thyromegaly present.  Cardiovascular: Normal rate, regular rhythm, normal heart sounds and intact distal pulses.   Pulmonary/Chest: Effort normal and breath sounds normal. No respiratory distress. She has no wheezes. She has no rales. She exhibits no tenderness.       O2 saturation 99%  Abdominal: Soft. Bowel sounds are normal. She exhibits no mass. There is no tenderness.  Musculoskeletal: Normal range of motion.  Lymphadenopathy:    She has no cervical adenopathy.  Neurological: She is alert and oriented to person, place, and time.  Skin: Skin is warm and dry. No rash noted.  Psychiatric: She has a normal mood and affect. Her behavior is normal.          Assessment & Plan:   Viral URI. Will treat symptomatically samples of Zutripro dispensed We'll call if unimproved or if she develops any wheezing

## 2010-09-24 NOTE — Patient Instructions (Signed)
Get plenty of rest, Drink lots of  clear liquids, and use Tylenol or ibuprofen for fever and discomfort.    Call or return to clinic prn if these symptoms worsen or fail to improve as anticipated.  

## 2010-12-18 ENCOUNTER — Telehealth: Payer: Self-pay | Admitting: Family Medicine

## 2010-12-18 NOTE — Telephone Encounter (Signed)
Spoke with patient.

## 2010-12-18 NOTE — Telephone Encounter (Signed)
I would recommend she see her gastroenterologist here Dr. Marina Goodell.  She is been there before she can call and make her own appointment

## 2010-12-18 NOTE — Telephone Encounter (Signed)
Pt having problems with constipation and vomitting. Pt was referred to [facility] by Dr Tawanna Cooler, but has not gotten any better. Pt wants to know what she can do?

## 2011-01-21 ENCOUNTER — Other Ambulatory Visit: Payer: Self-pay | Admitting: Family Medicine

## 2011-01-21 ENCOUNTER — Other Ambulatory Visit: Payer: 59

## 2011-01-21 MED ORDER — LOSARTAN POTASSIUM-HCTZ 50-12.5 MG PO TABS: 1.0000 | ORAL_TABLET | Freq: Every day | ORAL | Status: DC

## 2011-01-21 NOTE — Telephone Encounter (Signed)
Pt req refill of losartan-hydrochlorothiazide (HYZAAR) 50-12.5 MG per tablet to [facility] in [place].

## 2011-01-28 ENCOUNTER — Encounter: Payer: 59 | Admitting: Family Medicine

## 2011-01-28 LAB — I-STAT 8, (EC8 V) (CONVERTED LAB)
Bicarbonate: 26.5 — ABNORMAL HIGH
Glucose, Bld: 108 — ABNORMAL HIGH
TCO2: 28
pCO2, Ven: 44.3 — ABNORMAL LOW
pH, Ven: 7.386 — ABNORMAL HIGH

## 2011-01-28 LAB — POCT CARDIAC MARKERS
Myoglobin, poc: 36.1
Operator id: 294511
Troponin i, poc: <0.05

## 2011-01-28 LAB — CBC
Hemoglobin: 12.7
MCHC: 34.3
MCV: 90
RBC: 4.12

## 2011-01-28 LAB — DIFFERENTIAL
Basophils Relative: 1
Eosinophils Absolute: 0.2
Eosinophils Relative: 3
Monocytes Absolute: 0.6
Monocytes Relative: 7
Neutrophils Relative %: 49

## 2011-02-08 LAB — AFB CULTURE WITH SMEAR (NOT AT ARMC): Acid Fast Smear: NONE SEEN

## 2011-02-08 LAB — FUNGUS CULTURE W SMEAR

## 2011-02-08 LAB — CULTURE, RESPIRATORY W GRAM STAIN: Gram Stain: NONE SEEN

## 2011-03-27 ENCOUNTER — Other Ambulatory Visit: Payer: 59

## 2011-04-02 ENCOUNTER — Encounter: Payer: 59 | Admitting: Family Medicine

## 2011-04-08 ENCOUNTER — Encounter: Payer: 59 | Admitting: Family Medicine

## 2011-04-11 ENCOUNTER — Encounter: Payer: 59 | Admitting: Family Medicine

## 2011-05-08 ENCOUNTER — Telehealth: Payer: Self-pay | Admitting: Family Medicine

## 2011-05-08 NOTE — Telephone Encounter (Signed)
She can try taking aleve and the same time with zomig.  If she is still having headache, she can try coming in for phenergen IM.

## 2011-05-08 NOTE — Telephone Encounter (Signed)
Patient will try the Aleve and call back if no improvement

## 2011-05-08 NOTE — Telephone Encounter (Signed)
Pt called in. Has had migraine x3 days. She is on Zomig, but is not working. Her legs & fingers seem to be tingling and go numb. This has been going on since migraine started. Please call her to see if there is anything she can take in addition to Zomig.

## 2011-05-09 ENCOUNTER — Ambulatory Visit (INDEPENDENT_AMBULATORY_CARE_PROVIDER_SITE_OTHER): Payer: 59 | Admitting: Family Medicine

## 2011-05-09 ENCOUNTER — Encounter: Payer: Self-pay | Admitting: Family Medicine

## 2011-05-09 ENCOUNTER — Encounter: Payer: Self-pay | Admitting: *Deleted

## 2011-05-09 ENCOUNTER — Telehealth: Payer: Self-pay | Admitting: Family Medicine

## 2011-05-09 DIAGNOSIS — G43909 Migraine, unspecified, not intractable, without status migrainosus: Secondary | ICD-10-CM

## 2011-05-09 DIAGNOSIS — G43919 Migraine, unspecified, intractable, without status migrainosus: Secondary | ICD-10-CM | POA: Insufficient documentation

## 2011-05-09 MED ORDER — HYDROCODONE-ACETAMINOPHEN 7.5-750 MG PO TABS: 1.0000 | ORAL_TABLET | Freq: Three times a day (TID) | ORAL | Status: AC | PRN

## 2011-05-09 MED ORDER — PROMETHAZINE HCL 25 MG/ML IJ SOLN
25.0000 mg | Freq: Four times a day (QID) | INTRAMUSCULAR | Status: DC | PRN
  Administered 2011-05-09: 25 mg via INTRAVENOUS

## 2011-05-09 MED ORDER — KETOROLAC TROMETHAMINE 60 MG/2ML IM SOLN
60.0000 mg | Freq: Once | INTRAMUSCULAR | Status: DC
  Administered 2011-05-09: 60 mg via INTRAMUSCULAR

## 2011-05-09 NOTE — Telephone Encounter (Signed)
Patient called on yesterday with a migraine and she stated that the nurse told her to take her Zomig. She still has the migraine and would like a call back with advice. Thanks.

## 2011-05-09 NOTE — Progress Notes (Signed)
  Subjective:    Patient ID: [name], female    DOB: 1966/03/04, 46 y.o.   MRN: [id number]  HPI Ms. Cue is a 46 year old female, nonsmoker, who comes in today with a migraine headache for two days.  She states on New Year's Eve she developed a serum beer, migraine, and did not have any medication with her.  Now the headache is constant.  A 10+.  Neurologic review of systems otherwise negative.  She had a headache like this about a year ago, and it took a shot of pain medicine to make it go away.  In the interim.  She is having headaches, but they always go away.  She takes her Zomig.   Review of Systems General and neurologic review of systems otherwise negative    Objective:   Physical Exam Well-developed well-nourished, female in acute distress, with severe pain.  Neurologic exam normal       Assessment & Plan:  Severe migraine intractable headache.  Plan she was given 60 mg of Toradol and 25 of Phenergan IM and after one hour.  Her pain went from a 10+ two of 3

## 2011-05-09 NOTE — Telephone Encounter (Signed)
Pt will be here around 3pm ok per MD

## 2011-05-09 NOTE — Patient Instructions (Signed)
Rest at home tonight.  Vicodin one half to one tablet every 4 to 6 hours as needed for headache.  Return next Tuesday for follow-up

## 2011-05-15 ENCOUNTER — Ambulatory Visit: Payer: 59 | Admitting: Family Medicine

## 2011-05-15 ENCOUNTER — Other Ambulatory Visit: Payer: 59

## 2011-05-22 ENCOUNTER — Encounter: Payer: Self-pay | Admitting: Family Medicine

## 2011-05-23 ENCOUNTER — Encounter: Payer: Self-pay | Admitting: Family Medicine

## 2011-05-23 ENCOUNTER — Ambulatory Visit (INDEPENDENT_AMBULATORY_CARE_PROVIDER_SITE_OTHER): Payer: 59 | Admitting: Family Medicine

## 2011-05-23 DIAGNOSIS — G43919 Migraine, unspecified, intractable, without status migrainosus: Secondary | ICD-10-CM

## 2011-05-23 DIAGNOSIS — Z01419 Encounter for gynecological examination (general) (routine) without abnormal findings: Secondary | ICD-10-CM | POA: Insufficient documentation

## 2011-05-23 DIAGNOSIS — I493 Ventricular premature depolarization: Secondary | ICD-10-CM | POA: Insufficient documentation

## 2011-05-23 DIAGNOSIS — E785 Hyperlipidemia, unspecified: Secondary | ICD-10-CM

## 2011-05-23 DIAGNOSIS — I10 Essential (primary) hypertension: Secondary | ICD-10-CM

## 2011-05-23 DIAGNOSIS — Z0000 Encounter for general adult medical examination without abnormal findings: Secondary | ICD-10-CM

## 2011-05-23 DIAGNOSIS — I4949 Other premature depolarization: Secondary | ICD-10-CM

## 2011-05-23 LAB — POCT URINALYSIS DIPSTICK
Protein, UA: NEGATIVE
Spec Grav, UA: 1.02
Urobilinogen, UA: 0.2

## 2011-05-23 MED ORDER — ZOLMITRIPTAN 2.5 MG PO TABS: 2.5000 mg | ORAL_TABLET | ORAL | Status: DC | PRN

## 2011-05-23 MED ORDER — LOSARTAN POTASSIUM-HCTZ 50-12.5 MG PO TABS: 1.0000 | ORAL_TABLET | Freq: Every day | ORAL | Status: DC

## 2011-05-23 MED ORDER — AMITRIPTYLINE HCL 25 MG PO TABS: 25.0000 mg | ORAL_TABLET | Freq: Every day | ORAL | Status: DC

## 2011-05-23 MED ORDER — SIMVASTATIN 20 MG PO TABS: 20.0000 mg | ORAL_TABLET | Freq: Every day | ORAL | Status: DC

## 2011-05-23 NOTE — Patient Instructions (Signed)
Go on a complete caffeine free diet.  Elavil 25 mg at bedtime.  Drink 24 ounces of water daily.  We will get you set up with a consult with Dr. Leone Payor.  Return to see me in one month for follow-up on the migraines.  Do a thorough breast exam monthly.  On your birthday, and called to get set up for your first mammogram.  Continue your blood pressure and cholesterol medicine daily

## 2011-05-23 NOTE — Progress Notes (Signed)
Subjective:    Patient ID: [name], female    DOB: 06-Jun-1965, 46 y.o.   MRN: [id number]  HPI T. Is a delightful, 46 year old, divorced female, nonsmoker, who comes in today for general physical examination because of some concerns.  She has a history of migraine headaches, usually very responsive to Zomig, however, last week.  She had a severe migraine.  She had to come in here.  We had to give her a shot of tramadol.  We also gave her a short course of prednisone.  Historically, she's been drinking a lot of [facility] and she wakes up at 3 o'clock in the morning and can't go back to sleep.  We talked about the relationship between caffeine sleep dysfunction and migraines.  She agrees to stop the caffeine and will start her on some Elavil to help with her sleep dysfunction.  She takes Hyzaar 50 -- 12.5 daily for hypertension.  BP 130/90.  She takes Zocor 20 mg nightly for hyperlipidemia.  Lipid panel pending.  We will do today.  She also takes aqmtiza 8 mg b.i.d. For chronic constipation.  This whole scenario began when she developed an hemorrhoids, bleeding, subsequently bled severely to a hemoglobin of 6.  Dr. Janee Morn and a co-surgeon finally stop the bleeding.  Since that, time she's a very tight rectal sphincter, and chronic, intermittent constipation.  We discussed various options will begin with a GI consult from Dr. Leone Payor.  She gets routine eye care, dental care, BSE monthly, never had a mammogram.  Refer for mammogram.  This when her tetanus 2009 ,,,,,,,, offered a flu shot today.  However, she declined.  She states she had the flu in November   Review of Systems  Constitutional: Negative.   HENT: Negative.   Eyes: Negative.   Respiratory: Negative.   Cardiovascular: Negative.   Gastrointestinal: Negative.   Genitourinary: Negative.   Musculoskeletal: Negative.   Neurological: Negative.   Hematological: Negative.   Psychiatric/Behavioral: Negative.          Objective:   Physical Exam  Constitutional: She appears well-developed and well-nourished.  HENT:  Head: Normocephalic and atraumatic.  Right Ear: External ear normal.  Left Ear: External ear normal.  Nose: Nose normal.  Mouth/Throat: Oropharynx is clear and moist.  Eyes: EOM are normal. Pupils are equal, round, and reactive to light.  Neck: Normal range of motion. Neck supple. No thyromegaly present.  Cardiovascular: Normal rate, regular rhythm, normal heart sounds and intact distal pulses.  Exam reveals no gallop and no friction rub.   No murmur heard. Pulmonary/Chest: Effort normal and breath sounds normal.  Abdominal: Soft. Bowel sounds are normal. She exhibits no distension and no mass. There is no tenderness. There is no rebound.  Genitourinary: Vagina normal and uterus normal. Guaiac negative stool. No vaginal discharge found.       Bilateral breast exam normal.  She was taught how to do a thorough breast exam monthly and given the number of the breast Center to call and get a mammogram.  This when her  Musculoskeletal: Normal range of motion.  Lymphadenopathy:    She has no cervical adenopathy.  Neurological: She is alert. She has normal reflexes. No cranial nerve deficit. She exhibits normal muscle tone. Coordination normal.  Skin: Skin is warm and dry.  Psychiatric: She has a normal mood and affect. Her behavior is normal. Judgment and thought content normal.          Assessment & Plan:  Healthy  female.  Hypertension.  Continue Hyzaar one daily.  Hyperlipidemia.  Continue Zocor 20 mg daily.  Breakthrough migraines.  Plan stop caffeine, Elavil 25 mg nightly continue Zomig p.r.n. Follow-up in 4 weeks.  Constipation.  Tight tight rectal sphincter from previous hemorrhoid surgery.  Plan GI consult with Dr. Leone Payor.  Status post childbirth x 2,,,,,,,,, children are 20 and 21.

## 2011-05-24 LAB — CBC WITH DIFFERENTIAL/PLATELET
Basophils Absolute: 0 10*3/uL (ref 0.0–0.1)
Basophils Relative: 0.4 % (ref 0.0–3.0)
Eosinophils Relative: 1.2 % (ref 0.0–5.0)
HCT: 40.3 % (ref 36.0–46.0)
Hemoglobin: 13.5 g/dL (ref 12.0–15.0)
Lymphocytes Relative: 30.2 % (ref 12.0–46.0)
Lymphs Abs: 2.6 10*3/uL (ref 0.7–4.0)
Monocytes Relative: 4.4 % (ref 3.0–12.0)
Neutro Abs: 5.6 10*3/uL (ref 1.4–7.7)
RBC: 4.42 Mil/uL (ref 3.87–5.11)
RDW: 13.1 % (ref 11.5–14.6)

## 2011-05-24 LAB — BASIC METABOLIC PANEL
CO2: 27 mEq/L (ref 19–32)
Calcium: 9 mg/dL (ref 8.4–10.5)
Creatinine, Ser: 0.9 mg/dL (ref 0.4–1.2)
Glucose, Bld: 72 mg/dL (ref 70–99)

## 2011-05-24 LAB — HEPATIC FUNCTION PANEL
Albumin: 4.5 g/dL (ref 3.5–5.2)
Alkaline Phosphatase: 67 U/L (ref 39–117)

## 2011-05-24 LAB — LIPID PANEL
Cholesterol: 238 mg/dL — ABNORMAL HIGH (ref 0–200)
HDL: 30.2 mg/dL — ABNORMAL LOW (ref >39.00)
Total CHOL/HDL Ratio: 8
VLDL: 29 mg/dL (ref 0.0–40.0)

## 2011-05-27 ENCOUNTER — Other Ambulatory Visit (HOSPITAL_COMMUNITY)
Admission: RE | Admit: 2011-05-27 | Discharge: 2011-05-27 | Disposition: A | Discharge status: Home / Self Care | Payer: 59 | Source: Ambulatory Visit | Attending: Family Medicine | Admitting: Family Medicine

## 2011-06-18 ENCOUNTER — Ambulatory Visit (INDEPENDENT_AMBULATORY_CARE_PROVIDER_SITE_OTHER): Payer: 59 | Admitting: Internal Medicine

## 2011-06-18 ENCOUNTER — Encounter: Payer: Self-pay | Admitting: Internal Medicine

## 2011-06-18 VITALS — BP 138/88 | HR 86 | Ht 67.0 in | Wt 167.6 lb

## 2011-06-18 DIAGNOSIS — K624 Stenosis of anus and rectum: Secondary | ICD-10-CM | POA: Insufficient documentation

## 2011-06-18 DIAGNOSIS — K5909 Other constipation: Secondary | ICD-10-CM

## 2011-06-18 DIAGNOSIS — K5900 Constipation, unspecified: Secondary | ICD-10-CM

## 2011-06-18 MED ORDER — POLYETHYLENE GLYCOL 3350 17 GM/SCOOP PO POWD: 17.0000 g | Freq: Every day | ORAL | Status: AC

## 2011-06-18 MED ORDER — AMBULATORY NON FORMULARY MEDICATION: Status: DC

## 2011-06-18 NOTE — Patient Instructions (Addendum)
Your prescription(s) has(have) been sent to [facility] in [facility] (Diltiazem gel). Please purchase Miralax over-the-counter and take 17 grams in 6-8 ounces of liquid daily. Dr. Leone Payor will send your office visit notes and recommendations to Dr. Elnoria Howard and you can discuss with Dr. Elnoria Howard your options. Dr. Leone Payor suggest that you go back to see Dr. Marin Olp at [facility] to discuss your other options.

## 2011-06-18 NOTE — Progress Notes (Signed)
Subjective:    Patient ID: [name], female    DOB: 12-Oct-1965, 46 y.o.   MRN: [id number]  HPI This pleasant 46 year old African American woman is seen for second opinion at the request of Dr. Tawanna Cooler. She has had hemorrhoid problems and presented to Dr. Elnoria Howard in December 2010 with rectal bleeding. Colonoscopy demonstrated prolapsing hemorrhoids. A scribe is external. Subsequently underwent hemorrhoid surgery with Dr. Janee Morn, had postoperative bleeding and had to have a repeat surgery. She says since that time she's had difficulty with defecation, with small stools and difficulty expelling the stool. She's had anorectal pain. This is generally on the left side. At some point she sought a colorectal surgeon at [facility], Dr. Marin Olp, she had a procedure where she had hemorrhoidal ligation, but nothing further. She says they discussed possible repair of her anal stenosis problems but she was reluctant to do so because of her previous bad experience in the potential for complications and lack of guarantee as far as success. She's been tried on MiraLax fiber and other laxatives. She developed diarrhea at one point when she was taking MiraLax twice daily, and with difficulties getting to the bathroom at work that was problematic. She ultimately ended up on Amitiza 24 mcg twice a day. For a while she was having regular bowel movements it was improved. However now she is moving her bowels about every other day, but sometimes she will move them twice a day. She denies significant abdominal pain. She says she gets the urged to defecate just hard to produce the bowel movement because of rectal pain and anal stenosis-type problems. She occasionally see spots of bright red blood with defecation.  He denies significant abdominal pain. She is gassy at times. She says she did not have constipation problems prior to her hemorrhoid surgery. No Known Allergies Outpatient Prescriptions Prior to Visit  Medication Sig Dispense  Refill  . amitriptyline (ELAVIL) 25 MG tablet Take 1 tablet (25 mg total) by mouth at bedtime.  100 tablet  3  . aspirin-acetaminophen-caffeine (EXCEDRIN EXTRA STRENGTH) 250-250-65 MG per tablet Take 1 tablet by mouth every 6 (six) hours as needed.        . cyclobenzaprine (FLEXERIL) 10 MG tablet Take 10 mg by mouth 3 (three) times daily as needed.        Marland Kitchen HYDROcodone-acetaminophen (VICODIN ES) 7.5-750 MG per tablet Take 1 tablet by mouth at bedtime.       Marland Kitchen losartan-hydrochlorothiazide (HYZAAR) 50-12.5 MG per tablet Take 1 tablet by mouth daily.  100 tablet  3  . lubiprostone (AMITIZA) 8 MCG capsule Take 24 mcg by mouth 2 (two) times daily with a meal.       . naproxen sodium (ANAPROX) 220 MG tablet Take 220 mg by mouth 2 (two) times daily with a meal.        . simvastatin (ZOCOR) 20 MG tablet Take 1 tablet (20 mg total) by mouth at bedtime.  100 tablet  3  . ZOLMitriptan (ZOMIG) 2.5 MG tablet Take 1 tablet (2.5 mg total) by mouth as needed.  10 tablet  6   Facility-Administered Medications Prior to Visit  Medication Dose Route Frequency Provider Last Rate Last Dose  . promethazine (PHENERGAN) injection 25 mg  25 mg Intravenous Q6H PRN Evette Georges, MD   25 mg at 05/09/11 1731   Past Medical History  Diagnosis Date  . Hyperlipidemia   . Pulmonary nodules     not seen on plain cxr, first detected 1/07  re ct 10/08 pos IPPD > 15 mm 12/09 FOB/lavage 04/20/08 neg afb smear  . Migraine headache   . Iron deficiency anemia   . PPD positive   . Hyperthyroidism     hx 46 years old- oral meds  . Bell's palsy     with res left lag  . External hemorrhoids   . History of pneumonia    Past Surgical History  Procedure Date  . Tubal ligation   . Endometrial ablation   . Hemorrhoid surgery     x 3  . Colonoscopy 05/04/2009    prolapsing external hemorrhoids   History   Social History  . Marital Status: Divorced    Spouse Name: N/A    Number of Children: 2  . Years of Education: N/A    Occupational History  . accounts billing    Social History Main Topics  . Smoking status: Never Smoker   . Smokeless tobacco: Never Used  . Alcohol Use: No  . Drug Use: No  .            Social History Narrative   Divorced with childrenOccupation Lab [employer] DepartmentPossible TB exposure 2008 (after nodules found)[facility] [place] [employer] [place]   Family History  Problem Relation Age of Onset  . Heart disease Maternal Grandmother     aunts, uncles, sister  . Kidney failure Sister   . Lung cancer      aunts and uncles  . Colon cancer Maternal Aunt     aunts and uncles  . Colon cancer Maternal Uncle         Review of Systems Positive for some anorexia, all the review of systems negative or as mentioned above in history of present illness.    Objective:   Physical Exam General:  Well-developed, well-nourished and in no acute distress Eyes:  anicteric. ENT:   Mouth and posterior pharynx free of lesions.  Neck:   supple w/o thyromegaly or mass.  Lungs: Clear to auscultation bilaterally. Heart:  S1S2, no rubs, murmurs, gallops. Abdomen:  soft, non-tender, no hepatosplenomegaly, hernia, or mass and BS+.  Rectal: Examined with female staff present. There are some protruding fleshy tags of the anal area otherwise anoderm looks normal. Anal wink is absent. Digital rectal exam revealed anal stenosis, with moderate tenderness in the left side. It is difficult to insert the finger fully    though index finger does insert. She used to tender to assess simulated defecation maneuvers. Lymph:  no cervical or supraclavicular adenopathy. Extremities:   no edema Skin   no rash. Neuro:  A&O x 3.  Psych:  appropriate mood and  Affect.   Data Reviewed: Office notes from Dr. Elnoria Howard. Last note 02/13/2011. Colonoscopy 05/04/2009, normal colon with prolapsing hemorrhoids. Operative notes, 06/05/2009, single column internal hemorrhoidectomy by Dr. Janee Morn. 06/16/2009,  suture to treat bleeding after hemorrhoidectomy.      Assessment & Plan:   1. Acquired anal stenosis   2. Chronic constipation    It sounds like her problems are the result of anal stenosis from prior hemorrhoid surgery. She is improved but her quality of life is not she would like it to be.  I have recommended she add MiraLax back to her regimen to see if that makes a difference. She will also try diltiazem gel 2%. She does not appear to have a fissure though she has some symptoms consistent with that. It may provide some relief though it may not be long-lasting.  I have also advised she discussed this with Dr. Elnoria Howard, her regular gastroenterologist as well as I think it's in her interest to return for repeat evaluation by Dr. Marin Olp at [facility].  I will copy Drs. Elnoria Howard, Dr. Tawanna Cooler and Dr. Marin Olp.

## 2011-06-24 ENCOUNTER — Ambulatory Visit: Payer: 59 | Admitting: Family Medicine

## 2011-07-15 ENCOUNTER — Ambulatory Visit (INDEPENDENT_AMBULATORY_CARE_PROVIDER_SITE_OTHER): Payer: 59 | Admitting: Family Medicine

## 2011-07-15 ENCOUNTER — Encounter: Payer: Self-pay | Admitting: Family Medicine

## 2011-07-15 DIAGNOSIS — I10 Essential (primary) hypertension: Secondary | ICD-10-CM

## 2011-07-15 DIAGNOSIS — I493 Ventricular premature depolarization: Secondary | ICD-10-CM

## 2011-07-15 DIAGNOSIS — G43919 Migraine, unspecified, intractable, without status migrainosus: Secondary | ICD-10-CM

## 2011-07-15 DIAGNOSIS — I4949 Other premature depolarization: Secondary | ICD-10-CM

## 2011-07-15 MED ORDER — AMITRIPTYLINE HCL 50 MG PO TABS: 50.0000 mg | ORAL_TABLET | Freq: Every day | ORAL | Status: DC

## 2011-07-15 MED ORDER — HYDROCODONE-HOMATROPINE 5-1.5 MG/5ML PO SYRP: ORAL_SOLUTION | ORAL | Status: DC

## 2011-07-15 NOTE — Progress Notes (Signed)
  Subjective:    Patient ID: [name], female    DOB: 1965/06/16, 46 y.o.   MRN: [id number]  HPI [name] is a 46 year old female nonsmoker who comes in today for followup of migraine headaches  Week start on Elavil 25 mg each bedtime because the trigger for her migraines his sleep dysfunction. She now is sleeping better but only about 4-5 hours. Her migraines are diminished and seemed to abate rather quickly if she takes the Zomig sublingually stat.  BP today right arm sitting position 160/100 180/100 by me. She takes Hyzaar 50-12.5 daily for hypertension.  Also I noticed her pulse is skipping occasionally. She states she had a cardiac workup about 5 years ago by [name] and was told she had benign PVCs   Review of Systems    cardiovascular pulmonary review of systems otherwise negative Objective:   Physical Exam  Well-developed well-nourished female in no acute distress heart lungs are normal except for an occasional skip BP right arm sitting position 180/90      Assessment & Plan:  Hypertension question dull plan continue current med BP check every morning followup in 3 weeks  Migraine headaches improved control increase Elavil to 50 mg daily  Continue to avoid caffeine

## 2011-07-15 NOTE — Patient Instructions (Signed)
Increase the Elavil to 50 mg a day at bedtime  Take the Zomig when necessary  Continue to avoid caffeine  Check a blood pressure daily in the morning and take your blood pressure medication daily in the morning  Return in 3 weeks for followup

## 2011-08-08 ENCOUNTER — Ambulatory Visit: Payer: 59 | Admitting: Family Medicine

## 2011-08-14 ENCOUNTER — Ambulatory Visit (INDEPENDENT_AMBULATORY_CARE_PROVIDER_SITE_OTHER): Payer: 59 | Admitting: Family Medicine

## 2011-08-14 ENCOUNTER — Encounter: Payer: Self-pay | Admitting: *Deleted

## 2011-08-14 ENCOUNTER — Encounter: Payer: Self-pay | Admitting: Family Medicine

## 2011-08-14 ENCOUNTER — Telehealth: Payer: Self-pay | Admitting: Family Medicine

## 2011-08-14 VITALS — BP 120/90 | Temp 98.4°F | Wt 167.0 lb

## 2011-08-14 DIAGNOSIS — R197 Diarrhea, unspecified: Secondary | ICD-10-CM

## 2011-08-14 MED ORDER — HYDROCODONE-HOMATROPINE 5-1.5 MG/5ML PO SYRP: ORAL_SOLUTION | ORAL | Status: DC

## 2011-08-14 NOTE — Progress Notes (Signed)
  Subjective:    Patient ID: [name], female    DOB: [birth year], 46 y.o.   MRN: [id number]  HPI T. is a 46 year old female who comes in today with a 12 hour history of nausea fever chills and diarrhea  No history of colitis no vomiting   Review of Systems Review of systems negative    Objective:   Physical Exam  Well-developed well nourished female in no acute distress      Assessment & Plan:

## 2011-08-14 NOTE — Patient Instructions (Signed)
Tylenol for fever and chills  Clear liquid diet  No food  Hydromet 1/2-1 teaspoon 3 times a day as needed for diarrhea

## 2011-08-14 NOTE — Telephone Encounter (Signed)
Pt called and said that she has a stomach virus and wants to know if otc Imodium would be ok for her to take or what would doctor recommend? If pt could come in to see Dr Tawanna Cooler if he has ov avail today.

## 2011-08-14 NOTE — Telephone Encounter (Signed)
appt scheduled with Dr. Tawanna Cooler today at 11:30am

## 2011-08-21 ENCOUNTER — Telehealth: Payer: Self-pay | Admitting: Family Medicine

## 2011-08-21 NOTE — Telephone Encounter (Signed)
Okey Regal, I checked with Fleet Contras. Oran Rein is seeing Dr. Nelida Meuse pts while he is out - he spoke with her about this before the left. Please call this pt and schedule the post hosp. Thanks!

## 2011-08-21 NOTE — Telephone Encounter (Signed)
Called pt and schd a hosp fup to see [name] on 08/27/11 at 8:30am as noted.

## 2011-08-21 NOTE — Telephone Encounter (Signed)
Pt called and went ER yesterday. Pt had low potassium, fainting spells and chest pains. Pt is needing a hosp fup with pcp. Pt is aware that pcp is out office.

## 2011-08-27 ENCOUNTER — Ambulatory Visit (INDEPENDENT_AMBULATORY_CARE_PROVIDER_SITE_OTHER): Payer: 59 | Admitting: Family

## 2011-08-27 ENCOUNTER — Encounter: Payer: Self-pay | Admitting: Family

## 2011-08-27 VITALS — BP 120/90 | Temp 97.9°F | Wt 167.0 lb

## 2011-08-27 DIAGNOSIS — K219 Gastro-esophageal reflux disease without esophagitis: Secondary | ICD-10-CM

## 2011-08-27 DIAGNOSIS — E876 Hypokalemia: Secondary | ICD-10-CM

## 2011-08-27 DIAGNOSIS — K5909 Other constipation: Secondary | ICD-10-CM

## 2011-08-27 DIAGNOSIS — K5900 Constipation, unspecified: Secondary | ICD-10-CM

## 2011-08-27 LAB — BASIC METABOLIC PANEL
BUN: 9 mg/dL (ref 6–23)
Calcium: 9.1 mg/dL (ref 8.4–10.5)
Creatinine, Ser: 0.9 mg/dL (ref 0.4–1.2)
GFR: 89.83 mL/min (ref >60.00)
Glucose, Bld: 85 mg/dL (ref 70–99)

## 2011-08-27 MED ORDER — OMEPRAZOLE 40 MG PO CPDR: 40.0000 mg | DELAYED_RELEASE_CAPSULE | Freq: Every day | ORAL | Status: DC

## 2011-08-27 MED ORDER — LINACLOTIDE 145 MCG PO CAPS: 145.0000 mg | ORAL_CAPSULE | Freq: Every day | ORAL | Status: DC

## 2011-08-27 NOTE — Progress Notes (Signed)
Subjective:    Patient ID: [name], female    DOB: 07/20/1965, 46 y.o.   MRN: [id number]  HPI Comments: 46 yo black female presents for follow up visit from ED last Tues for nausea and cp. Was diagnosed with GERD, hypokalemia, and chronic constipation. Has history of anal stenosis.Last colonoscopy 2010, negative.  Was not prescribed ppi at ED encounter and does not take any OTC meds for GERD or constipation.  C/o continued constipation and abdominal discomfort relieved with defecation. Mid abdominal discomfort currently 4-5/10 described as burning/aching. "Amitiza for constipation is not working anymore." Denies nausea, vomiting, diaphoresis, or cp. Last formed soft bm this am. C/o hemorrhoidal discomfort 6/10 described as burning. "Nothing makes pain worse." Takes prescribed vicodin prn for pain and OTC aleve which are effective. Does not consume heart healthy diet and high fiber.     Review of Systems  Constitutional: Negative.   Respiratory: Negative.   Cardiovascular: Negative.   Gastrointestinal: Positive for abdominal pain, constipation, abdominal distention and rectal pain. Negative for nausea, vomiting, diarrhea, blood in stool and anal bleeding.  Genitourinary: Negative.    Past Medical History  Diagnosis Date  . Hyperlipidemia   . Pulmonary nodules     not seen on plain cxr, first detected 1/07 re ct 10/08 pos IPPD > 15 mm 12/09 FOB/lavage 04/20/08 neg afb smear  . Migraine headache   . Iron deficiency anemia   . PPD positive   . Hyperthyroidism     hx 46 years old- oral meds  . Bell's palsy     with res left lag  . External hemorrhoids   . History of pneumonia   . PVC's (premature ventricular contractions)     History   Social History  . Marital Status: Divorced    Spouse Name: N/A    Number of Children: 2  . 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 Smoking status: Never Smoker   .  Smokeless tobacco: Never Used  . Alcohol Use: No  . Drug Use: No  . Sexually Active: Not on file   Other Topics Concern  . Not on file   Social History Narrative   Divorced with childrenOccupation Lab [employer] DepartmentPossible TB exposure 2008 (after nodules found)[facility] [place] and [employer] [place]    Past Surgical History  Procedure Date  . Tubal ligation   . Endometrial ablation   . Hemorrhoid surgery     x 3  . Colonoscopy 05/04/2009    prolapsing external hemorrhoids    Family History  Problem Relation Age of Onset  . Heart disease Maternal Grandmother     aunts, uncles, sister  . Kidney failure Sister   . Lung cancer      aunts and uncles  . Colon cancer Maternal Aunt     aunts and uncles  . Colon cancer Maternal Uncle     No Known Allergies  Current Outpatient Prescriptions on File Prior to Visit  Medication Sig Dispense Refill  . AMBULATORY NON FORMULARY MEDICATION Medication Name: Diltiazem 2% gel Apply pea-sized amount to anal area twice a day  30 g  0  . amitriptyline (ELAVIL) 50 MG tablet Take 1 tablet (50 mg total) by mouth at bedtime.  100 tablet  3  . aspirin-acetaminophen-caffeine (EXCEDRIN EXTRA STRENGTH) 250-250-65 MG per tablet Take 1 tablet by mouth every 6 (six) hours as needed.        Marland Kitchen  cyclobenzaprine (FLEXERIL) 10 MG tablet Take 10 mg by mouth 3 (three) times daily as needed.        Marland Kitchen HYDROcodone-acetaminophen (VICODIN ES) 7.5-750 MG per tablet Take 1 tablet by mouth at bedtime.       Marland Kitchen HYDROcodone-homatropine (HYCODAN) 5-1.5 MG/5ML syrup One half to 1 teaspoon 3 times a day when necessary  120 mL  0  . losartan-hydrochlorothiazide (HYZAAR) 50-12.5 MG per tablet Take 1 tablet by mouth daily.  100 tablet  3  . lubiprostone (AMITIZA) 8 MCG capsule Take 24 mcg by mouth 2 (two) times daily with a meal.       . naproxen sodium (ANAPROX) 220 MG tablet Take 220 mg by mouth 2 (two) times daily with a meal.        . simvastatin (ZOCOR) 20  MG tablet Take 1 tablet (20 mg total) by mouth at bedtime.  100 tablet  3  . ZOLMitriptan (ZOMIG) 2.5 MG tablet Take 1 tablet (2.5 mg total) by mouth as needed.  10 tablet  6   Current Facility-Administered Medications on File Prior to Visit  Medication Dose Route Frequency Provider Last Rate Last Dose  . promethazine (PHENERGAN) injection 25 mg  25 mg Intravenous Q6H PRN Roderick Pee, MD   25 mg at 05/09/11 1731    BP 120/90  Temp(Src) 97.9 F (36.6 C) (Oral)  Wt 167 lb (75.751 kg)chart     Objective:   Physical Exam  Constitutional: She is oriented to person, place, and time. She appears well-developed and well-nourished.  Cardiovascular: Normal rate, regular rhythm, normal heart sounds and intact distal pulses.  Exam reveals no gallop and no friction rub.   No murmur heard. Pulmonary/Chest: Effort normal and breath sounds normal. No respiratory distress. She has no wheezes. She has no rales. She exhibits no tenderness.  Abdominal: Soft. Bowel sounds are normal. She exhibits distension. She exhibits no mass. There is no tenderness. There is no rebound and no guarding.  Neurological: She is alert and oriented to person, place, and time.  Skin: Skin is warm and dry. She is not diaphoretic.          Assessment & Plan:  Assessment: Chronic constipation, GERD Plan: Labs: BMP, Linzess, Omeprazole QD, tucks, warm sitz baths, follow up with [facility] for anal stenosis, increase fiber and water consumption in diet, discussed heart healthy diet. Teaching handouts on diagnosis and treatment provided. Opportunity for questions provided. Encouraged to RTC if s/s get worse.

## 2011-08-27 NOTE — Patient Instructions (Signed)
Diet for GERD or PUD  Nutrition therapy can help ease the discomfort of gastroesophageal reflux disease (GERD) and peptic ulcer disease (PUD).   HOME CARE INSTRUCTIONS    Eat your meals slowly, in a relaxed setting.   Eat 5 to 6 small meals per day.   If a food causes distress, stop eating it for a period of time.  FOODS TO AVOID   Coffee, regular or decaffeinated.   Cola beverages, regular or low calorie.   Tea, regular or decaffeinated.   Pepper.   Cocoa.   High fat foods, including meats.   Butter, margarine, hydrogenated oil (trans fats).   Peppermint or spearmint (if you have GERD).   Fruits and vegetables if not tolerated.   Alcohol.   Nicotine (smoking or chewing). This is one of the most potent stimulants to acid production in the gastrointestinal tract.   Any food that seems to aggravate your condition.  If you have questions regarding your diet, ask your caregiver or a registered dietitian.  TIPS   Lying flat may make symptoms worse. Keep the head of your bed raised 6 to 9 inches (15 to 23 cm) by using a foam wedge or blocks under the legs of the bed.   Do not lay down until 3 hours after eating a meal.   Daily physical activity may help reduce symptoms.  MAKE SURE YOU:    Understand these instructions.   Will watch your condition.   Will get help right away if you are not doing well or get worse.  Document Released: 04/22/2005 Document Revised: 04/11/2011 Document Reviewed: 03/08/2011  ExitCare Patient Information 2012 [employer].    Gastroesophageal Reflux Disease, Adult  Gastroesophageal reflux disease (GERD) happens when acid from your stomach flows up into the esophagus. When acid comes in contact with the esophagus, the acid causes soreness (inflammation) in the esophagus. Over time, GERD may create small holes (ulcers) in the lining of the esophagus.  CAUSES    Increased body weight. This puts pressure on the stomach, making acid rise from the stomach into the  esophagus.   Smoking. This increases acid production in the stomach.   Drinking alcohol. This causes decreased pressure in the lower esophageal sphincter (valve or ring of muscle between the esophagus and stomach), allowing acid from the stomach into the esophagus.   Late evening meals and a full stomach. This increases pressure and acid production in the stomach.   A malformed lower esophageal sphincter.  Sometimes, no cause is found.  SYMPTOMS    Burning pain in the lower part of the mid-chest behind the breastbone and in the mid-stomach area. This may occur twice a week or more often.   Trouble swallowing.   Sore throat.   Dry cough.   Asthma-like symptoms including chest tightness, shortness of breath, or wheezing.  DIAGNOSIS   Your caregiver may be able to diagnose GERD based on your symptoms. In some cases, X-rays and other tests may be done to check for complications or to check the condition of your stomach and esophagus.  TREATMENT   Your caregiver may recommend over-the-counter or prescription medicines to help decrease acid production. Ask your caregiver before starting or adding any new medicines.   HOME CARE INSTRUCTIONS    Change the factors that you can control. Ask your caregiver for guidance concerning weight loss, quitting smoking, and alcohol consumption.   Avoid foods and drinks that make your symptoms worse, such as:   Caffeine or   alcoholic drinks.   Chocolate.   Peppermint or mint flavorings.   Garlic and onions.   Spicy foods.   Citrus fruits, such as oranges, lemons, or limes.   Tomato-based foods such as sauce, chili, salsa, and pizza.   Fried and fatty foods.   Avoid lying down for the 3 hours prior to your bedtime or prior to taking a nap.   Eat small, frequent meals instead of large meals.   Wear loose-fitting clothing. Do not wear anything tight around your waist that causes pressure on your stomach.   Raise the head of your bed 6 to 8 inches with wood blocks to  help you sleep. Extra pillows will not help.   Only take over-the-counter or prescription medicines for pain, discomfort, or fever as directed by your caregiver.   Do not take aspirin, ibuprofen, or other nonsteroidal anti-inflammatory drugs (NSAIDs).  SEEK IMMEDIATE MEDICAL CARE IF:    You have pain in your arms, neck, jaw, teeth, or back.   Your pain increases or changes in intensity or duration.   You develop nausea, vomiting, or sweating (diaphoresis).   You develop shortness of breath, or you faint.   Your vomit is green, yellow, black, or looks like coffee grounds or blood.   Your stool is red, bloody, or black.  These symptoms could be signs of other problems, such as heart disease, gastric bleeding, or esophageal bleeding.  MAKE SURE YOU:    Understand these instructions.   Will watch your condition.   Will get help right away if you are not doing well or get worse.  Document Released: 01/30/2005 Document Revised: 04/11/2011 Document Reviewed: 11/09/2010  Exit[employer] Patient Information 2012 [employer].

## 2011-09-12 ENCOUNTER — Ambulatory Visit (INDEPENDENT_AMBULATORY_CARE_PROVIDER_SITE_OTHER): Payer: 59 | Admitting: Family Medicine

## 2011-09-12 ENCOUNTER — Encounter: Payer: Self-pay | Admitting: Family Medicine

## 2011-09-12 VITALS — BP 150/110 | Temp 98.1°F | Wt 171.0 lb

## 2011-09-12 DIAGNOSIS — I10 Essential (primary) hypertension: Secondary | ICD-10-CM

## 2011-09-12 DIAGNOSIS — K624 Stenosis of anus and rectum: Secondary | ICD-10-CM

## 2011-09-12 MED ORDER — LOSARTAN POTASSIUM-HCTZ 100-12.5 MG PO TABS: 1.0000 | ORAL_TABLET | Freq: Every day | ORAL | Status: DC

## 2011-09-12 MED ORDER — PEG 3350-KCL-NABCB-NACL-NASULF 236 G PO SOLR: ORAL | Status: DC

## 2011-09-12 NOTE — Patient Instructions (Signed)
Over the weekend on a clear liquid diet and use the GoLYTELY to clean your colon a  Call [facility] tomorrow to make an appointment ASAP  Increase the Hyzaar to 100 mg daily  BP check daily a.m. followup in 4 weeks with me

## 2011-09-12 NOTE — Progress Notes (Signed)
  Subjective:    Patient ID: [name], female    DOB: 1965-12-03, 46 y.o.   MRN: [id number]  HPI [name] who comes in today for evaluation of 2 problems  She currently takes Hyzaar 50-12.5 daily for hypertension BP is in the last 3 weeks been averaging 150/100. BP today by me 150/100 she's been compliant with her medication taking it daily  She has a history of acquired anal stenosis and continues to have increasing symptoms abdominal pain constipation secondary to the inability to defecate. She's been to [facility] once and have offered to try a repair however she's been reluctant to do it but I explained to her she can't go on like this   Review of Systems Gen. cardiovascular GI review of systems otherwise negative    Objective:   Physical Exam Well-developed well nourished female in acute distress BP right in sitting position 150/100       Assessment & Plan:  Hypertension not at goal increase Hyzaar to 100-12.5 daily BP check daily followup 4 weeks  Anal stenosis refer back to [facility] for definitive surgical

## 2011-09-27 ENCOUNTER — Telehealth: Payer: Self-pay | Admitting: Family Medicine

## 2011-09-27 ENCOUNTER — Ambulatory Visit (INDEPENDENT_AMBULATORY_CARE_PROVIDER_SITE_OTHER): Payer: 59 | Admitting: Family Medicine

## 2011-09-27 ENCOUNTER — Encounter: Payer: Self-pay | Admitting: Family Medicine

## 2011-09-27 ENCOUNTER — Ambulatory Visit
Admission: RE | Admit: 2011-09-27 | Discharge: 2011-09-27 | Disposition: A | Discharge status: Home / Self Care | Payer: 59 | Source: Ambulatory Visit | Attending: Family Medicine | Admitting: Family Medicine

## 2011-09-27 VITALS — BP 140/100 | Temp 98.7°F | Wt 167.0 lb

## 2011-09-27 DIAGNOSIS — R1011 Right upper quadrant pain: Secondary | ICD-10-CM

## 2011-09-27 LAB — CBC WITH DIFFERENTIAL/PLATELET
Basophils Absolute: 0 10*3/uL (ref 0.0–0.1)
HCT: 42.3 % (ref 36.0–46.0)
Lymphocytes Relative: 26.8 % (ref 12.0–46.0)
Lymphs Abs: 2 10*3/uL (ref 0.7–4.0)
Monocytes Relative: 5.9 % (ref 3.0–12.0)
Neutrophils Relative %: 65.4 % (ref 43.0–77.0)
Platelets: 254 10*3/uL (ref 150.0–400.0)
RDW: 13.3 % (ref 11.5–14.6)

## 2011-09-27 LAB — HEPATIC FUNCTION PANEL
AST: 13 U/L (ref 0–37)
Total Bilirubin: 1.4 mg/dL — ABNORMAL HIGH (ref 0.3–1.2)

## 2011-09-27 NOTE — Patient Instructions (Signed)
Follow up immediately for any fever, recurrent vomiting, or worsening abdominal pain Avoid high fat foods

## 2011-09-27 NOTE — Progress Notes (Signed)
  Subjective:    Patient ID: [name], female    DOB: 1965/09/29, 46 y.o.   MRN: [id number]  HPI  Acute visit. Progressive right upper quadrant abdominal pain over the past week. She has a dull achy quality pain sometimes 8/10 in intensity. Relatively constant but worsening off and on. Symptoms tend to be worse after eating. No alleviating factors. Has taken Aleve without relief. She has nausea frequently after eating but no vomiting. No stool changes. No dysuria. No rashes. Pain radiates to the back. No history of gallbladder disease.  Chest chronic problems of hyperlipidemia, hypertension, migraine headaches, PVCs, and history of anal stenosis.  Past Medical History  Diagnosis Date  . Hyperlipidemia   . Pulmonary nodules     not seen on plain cxr, first detected 1/07 re ct 10/08 pos IPPD > 15 mm 12/09 FOB/lavage 04/20/08 neg afb smear  . Migraine headache   . Iron deficiency anemia   . PPD positive   . Hyperthyroidism     hx 46 years old- oral meds  . Bell's palsy     with res left lag  . External hemorrhoids   . History of pneumonia   . PVC's (premature ventricular contractions)    Past Surgical History  Procedure Date  . Tubal ligation   . Endometrial ablation   . Hemorrhoid surgery     x 3  . Colonoscopy 05/04/2009    prolapsing external hemorrhoids    reports that she has never smoked. She has never used smokeless tobacco. She reports that she does not drink alcohol or use illicit drugs. family history includes Colon cancer in her maternal aunt and maternal uncle; Heart disease in her maternal grandmother; Kidney failure in her sister; and Lung cancer in an unspecified family member. No Known Allergies    Review of Systems  Constitutional: Negative for fever and chills.  Respiratory: Negative for cough and shortness of breath.   Cardiovascular: Negative for chest pain.  Gastrointestinal: Positive for nausea and abdominal pain. Negative for vomiting and blood  in stool.  Genitourinary: Negative for dysuria.  Neurological: Negative for dizziness.  Hematological: Negative for adenopathy.       Objective:   Physical Exam  Constitutional: She appears well-developed and well-nourished.  Cardiovascular: Normal rate and regular rhythm.   Pulmonary/Chest: Effort normal and breath sounds normal. No respiratory distress. She has no wheezes. She has no rales.  Abdominal: Soft. Bowel sounds are normal. She exhibits no mass. There is tenderness. There is no rebound and no guarding.       Moderate tenderness to palpation right upper quadrant and minimally and to lesser extent epigastric          Assessment & Plan:  Progressive right upper quadrant abdominal pain over the past week. Exacerbated by eating. Rule out gallstones. Abdominal ultrasound. Check labs - CBC, hepatic panel, and amylase.

## 2011-09-27 NOTE — Telephone Encounter (Signed)
Pt is requesting abd ultrasound

## 2011-10-01 NOTE — Telephone Encounter (Signed)
Call placed to patient at [phone], she was informed of lab results and ultrasound per Dr Caryl Never instructions.

## 2011-10-10 ENCOUNTER — Ambulatory Visit: Payer: 59 | Admitting: Family Medicine

## 2011-11-19 ENCOUNTER — Ambulatory Visit: Payer: 59 | Admitting: Family Medicine

## 2012-03-28 ENCOUNTER — Other Ambulatory Visit: Payer: Self-pay | Admitting: Family Medicine

## 2012-04-07 ENCOUNTER — Encounter: Payer: Self-pay | Admitting: Family Medicine

## 2012-04-07 ENCOUNTER — Ambulatory Visit (INDEPENDENT_AMBULATORY_CARE_PROVIDER_SITE_OTHER): Payer: 59 | Admitting: Family Medicine

## 2012-04-07 VITALS — BP 160/100 | Temp 98.2°F | Wt 177.0 lb

## 2012-04-07 DIAGNOSIS — K5909 Other constipation: Secondary | ICD-10-CM

## 2012-04-07 DIAGNOSIS — K5900 Constipation, unspecified: Secondary | ICD-10-CM

## 2012-04-07 DIAGNOSIS — K624 Stenosis of anus and rectum: Secondary | ICD-10-CM

## 2012-04-07 NOTE — Patient Instructions (Signed)
Call you gastroenterologist today for reconsultation warrior for referral to [facility] where you previously been

## 2012-04-07 NOTE — Progress Notes (Signed)
  Subjective:    Patient ID: [name], female    DOB: [birth year], 46 y.o.   MRN: [id number]  HPI [name] is a 46 year old female nonsmoker who comes in to discuss her GI problems  Her gastroenterologist here in town doctor hung saw her yesterday for evaluation. The week before she had been to [facility] emergency room and was told she had gallstones. She has chronic constipation. And she's not happy with the advice that she is getting from her GI doctor here in [place]. In the past they've referred her to [facility].  I explained her that this is really out of my field and that she needs to work with her gastroenterologist here in [place] or go back to [facility] for a second opinion   Review of Systems    review of systems otherwise negative Objective:   Physical Exam No exam done today       Assessment & Plan:  Chronic constipation  Anal stenosis  Referred back to GI for evaluation

## 2012-04-27 ENCOUNTER — Telehealth: Payer: Self-pay | Admitting: Family Medicine

## 2012-04-27 DIAGNOSIS — R928 Other abnormal and inconclusive findings on diagnostic imaging of breast: Secondary | ICD-10-CM

## 2012-04-27 NOTE — Telephone Encounter (Signed)
Left message on machine for patient to call back with more infomation

## 2012-04-27 NOTE — Telephone Encounter (Signed)
Pt states she had a full body scan done at [facility], a lesion was detected on her breast. Pt was instructed to have mammogram done.  Pt requesting order from Dr. Tawanna Cooler for diagnostic mammogram at the [facility].  If a copy of the body scan is needed please contact the office of Dr. Etta Quill at [phone]

## 2012-04-27 NOTE — Telephone Encounter (Signed)
Order sent.

## 2012-04-30 ENCOUNTER — Telehealth: Payer: Self-pay | Admitting: Family Medicine

## 2012-04-30 ENCOUNTER — Telehealth: Payer: Self-pay | Admitting: *Deleted

## 2012-04-30 DIAGNOSIS — N649 Disorder of breast, unspecified: Secondary | ICD-10-CM

## 2012-04-30 MED ORDER — TRAMADOL HCL 50 MG PO TABS: 50.0000 mg | ORAL_TABLET | Freq: Three times a day (TID) | ORAL | Status: DC | PRN

## 2012-04-30 NOTE — Telephone Encounter (Signed)
Please resend order for bilat mm to the breast center and state the detailed reason, such as, mass or nodule, etc.

## 2012-04-30 NOTE — Telephone Encounter (Signed)
Tramadol per Dr Tawanna Cooler.  Rx sent to pharmacy and patient is aware.

## 2012-04-30 NOTE — Telephone Encounter (Signed)
Patient would like a refill of hydrocodone until January 27th when she goes to [facility].  Okay to fill?

## 2012-05-01 NOTE — Telephone Encounter (Signed)
Spoke with patient and she will send a copy of the results

## 2012-05-06 HISTORY — PX: CHOLECYSTECTOMY: SHX55

## 2012-05-06 HISTORY — PX: BREAST BIOPSY: SHX20

## 2012-05-12 ENCOUNTER — Ambulatory Visit
Admission: RE | Admit: 2012-05-12 | Discharge: 2012-05-12 | Disposition: A | Discharge status: Home / Self Care | Payer: 59 | Source: Ambulatory Visit | Attending: Family Medicine | Admitting: Family Medicine

## 2012-05-12 DIAGNOSIS — N649 Disorder of breast, unspecified: Secondary | ICD-10-CM

## 2012-05-13 ENCOUNTER — Ambulatory Visit
Admission: RE | Admit: 2012-05-13 | Discharge: 2012-05-13 | Disposition: A | Discharge status: Home / Self Care | Payer: 59 | Source: Ambulatory Visit | Attending: Family Medicine | Admitting: Family Medicine

## 2012-05-13 ENCOUNTER — Other Ambulatory Visit: Payer: Self-pay | Admitting: Family Medicine

## 2012-05-13 DIAGNOSIS — N649 Disorder of breast, unspecified: Secondary | ICD-10-CM

## 2012-06-22 ENCOUNTER — Inpatient Hospital Stay (HOSPITAL_COMMUNITY): Payer: 59

## 2012-06-22 ENCOUNTER — Inpatient Hospital Stay (HOSPITAL_COMMUNITY)
Admission: AD | Admit: 2012-06-22 | Discharge: 2012-06-22 | Disposition: A | Discharge status: Home / Self Care | Payer: 59 | Source: Ambulatory Visit | Attending: Obstetrics & Gynecology | Admitting: Obstetrics & Gynecology

## 2012-06-22 ENCOUNTER — Ambulatory Visit (INDEPENDENT_AMBULATORY_CARE_PROVIDER_SITE_OTHER): Payer: 59 | Admitting: Family Medicine

## 2012-06-22 ENCOUNTER — Encounter (HOSPITAL_COMMUNITY): Payer: Self-pay | Admitting: *Deleted

## 2012-06-22 VITALS — BP 120/88 | Temp 100.2°F

## 2012-06-22 DIAGNOSIS — T8140XA Infection following a procedure, unspecified, initial encounter: Secondary | ICD-10-CM | POA: Insufficient documentation

## 2012-06-22 DIAGNOSIS — R5082 Postprocedural fever: Secondary | ICD-10-CM

## 2012-06-22 DIAGNOSIS — G8918 Other acute postprocedural pain: Secondary | ICD-10-CM

## 2012-06-22 DIAGNOSIS — N760 Acute vaginitis: Secondary | ICD-10-CM | POA: Insufficient documentation

## 2012-06-22 DIAGNOSIS — R509 Fever, unspecified: Secondary | ICD-10-CM

## 2012-06-22 LAB — CBC WITH DIFFERENTIAL/PLATELET
Basophils Absolute: 0 10*3/uL (ref 0.0–0.1)
Eosinophils Relative: 2 % (ref 0–5)
Lymphocytes Relative: 11 % — ABNORMAL LOW (ref 12–46)
Lymphs Abs: 1.3 10*3/uL (ref 0.7–4.0)
MCV: 86.3 fL (ref 78.0–100.0)
Neutro Abs: 9.5 10*3/uL — ABNORMAL HIGH (ref 1.7–7.7)
Neutrophils Relative %: 80 % — ABNORMAL HIGH (ref 43–77)
Platelets: 229 10*3/uL (ref 150–400)
RBC: 4.24 MIL/uL (ref 3.87–5.11)
RDW: 13.2 % (ref 11.5–15.5)
WBC: 11.9 10*3/uL — ABNORMAL HIGH (ref 4.0–10.5)

## 2012-06-22 LAB — URINALYSIS, ROUTINE W REFLEX MICROSCOPIC
Glucose, UA: NEGATIVE mg/dL
Ketones, ur: 15 mg/dL — AB
Leukocytes, UA: NEGATIVE
Nitrite: NEGATIVE
Specific Gravity, Urine: 1.02 (ref 1.005–1.030)
pH: 6.5 (ref 5.0–8.0)

## 2012-06-22 LAB — URINE MICROSCOPIC-ADD ON

## 2012-06-22 MED ORDER — KETOROLAC TROMETHAMINE 60 MG/2ML IM SOLN
60.0000 mg | Freq: Once | INTRAMUSCULAR | Status: AC
  Administered 2012-06-22: 60 mg via INTRAMUSCULAR
  Filled 2012-06-22: qty 2

## 2012-06-22 MED ORDER — IOHEXOL 300 MG/ML  SOLN
50.0000 mL | INTRAMUSCULAR | Status: AC
  Administered 2012-06-22: 50 mL via ORAL

## 2012-06-22 MED ORDER — AMOXICILLIN-POT CLAVULANATE 875-125 MG PO TABS: 1.0000 | ORAL_TABLET | Freq: Two times a day (BID) | ORAL | Status: DC

## 2012-06-22 MED ORDER — OXYCODONE-ACETAMINOPHEN 5-325 MG PO TABS
2.0000 | ORAL_TABLET | Freq: Once | ORAL | Status: AC
  Administered 2012-06-22: 2 via ORAL
  Filled 2012-06-22: qty 2

## 2012-06-22 MED ORDER — IOHEXOL 300 MG/ML  SOLN
100.0000 mL | Freq: Once | INTRAMUSCULAR | Status: AC | PRN
  Administered 2012-06-22: 100 mL via INTRAVENOUS

## 2012-06-22 MED ORDER — DOCUSATE SODIUM 100 MG PO CAPS: 100.0000 mg | ORAL_CAPSULE | Freq: Two times a day (BID) | ORAL | Status: DC

## 2012-06-22 MED ORDER — METRONIDAZOLE 500 MG PO TABS: 500.0000 mg | ORAL_TABLET | Freq: Two times a day (BID) | ORAL | Status: DC

## 2012-06-22 NOTE — MAU Provider Note (Signed)
I evaluated and examined this pt.  Will treat with atbx for possible cuff cellulitis.  D/w pt the importance of proper rest & recovery. Recommended that patient go to her surgeon within 48hours.  Pt expressed understanding.  Attestation of Attending Supervision of Advanced Practitioner (CNM/NP): Evaluation and management procedures were performed by the Advanced Practitioner under my supervision and collaboration.  I have reviewed the Advanced Practitioner's note and chart, and I agree with the management and plan.  HARRAWAY-SMITH, Mirtie Bastyr 8:48 PM

## 2012-06-22 NOTE — Progress Notes (Signed)
  Subjective:    Patient ID: [name], female    DOB: [birth year], 47 y.o.   MRN: [id number]  HPI Ms. [name] is a 47 year old female who comes in today for evaluation of fever chills  She states she had her uterus removed laparoscopically at [facility] last Wednesday. She was discharged did well until this past Sunday when she developed fever and chills and aching all over. No headache no sore throat no cough some nausea no vomiting. She states her abdominal pain is unchanged from postop pain. No urinary tract symptoms   Review of Systems    review of systems otherwise negative Objective:   Physical Exam Well-developed well-nourished female in acute pain HEENT negative cardiopulmonary negative abdomen stab wounds from laparoscopic surgery diffuse tenderness question rebound       Assessment & Plan:  Fever chills 5 days status post laparoscopic hysterectomy question bacterial infection versus viral plan her mother will take her to the emergency room stat for eval

## 2012-06-22 NOTE — MAU Provider Note (Signed)
History    CSN: [id number]  Arrival date and time: 06/22/12 1349   First Provider Initiated Contact with Patient 06/22/12 1455      Chief Complaint  Patient presents with  . Post-op Problem   HPI [name] is a 47 yo female who presents to the MAU complaining of fever, chills, and abdominal pain since yesterday afternoon. She reported to her family practice doctor this morning, who advised she come here to be seen. The patient reports she had a laparoscopic hysterectomy, with vaginal removal of the cervix, last Wednesday February 12th at [facility]. She reports that she was discharged Thursday and returned home. She reports feeling well and was getting up to move around the house Friday and Saturday. She was eating and drinking well both Friday and Saturday and keeping food down.  She states that Sunday morning she felt well enough to go to church, so she attended the service. Upon returning home from church she started to feel very achy and had chills. She then developed a fever of 101 and felt weak. She also reports mild shortness of breath during this time. She describes a sharp right-sided abdominal pain that occurs at rest and while up and moving. She reports that she was able to keep food and drink down yesterday, but that she was not interested in eating or drinking much of anything all day. She reports that she has been feeling nauseous, but denies any vomiting. She also reports having passed gas yesterday afternoon, and had a bowel movement on Saturday. She also reports that on Saturday night she decided to stop taking her Percocet. She reports that she chose to stop taking them because she wanted to "wean herself off" the pain medication.  She also reports having nose bleeds on Friday and Saturday, with the nose bleed on Saturday taking 10 minutes to stop. She also reports mild nasal congestion. She also reports that she did not take her blood pressure medication  this morning.  OB History   Grav Para Term Preterm Abortions TAB SAB Ect Mult Living   3 2        2       Past Medical History  Diagnosis Date  . Hyperlipidemia   . Pulmonary nodules     not seen on plain cxr, first detected 1/07 re ct 10/08 pos IPPD > 15 mm 12/09 FOB/lavage 04/20/08 neg afb smear  . Migraine headache   . Iron deficiency anemia   . PPD positive   . Hyperthyroidism     hx 47 years old- oral meds  . Bell's palsy     with res left lag  . External hemorrhoids   . History of pneumonia   . PVC's (premature ventricular contractions)     Past Surgical History  Procedure Laterality Date  . Tubal ligation    . Endometrial ablation    . Hemorrhoid surgery      x 3  . Colonoscopy  05/04/2009    prolapsing external hemorrhoids  . Abdominal hysterectomy      Family History  Problem Relation Age of Onset  . Heart disease Maternal Grandmother     aunts, uncles, sister  . Kidney failure Sister   . Lung cancer      aunts and uncles  . Colon cancer Maternal Aunt     aunts and uncles  . Colon cancer Maternal Uncle     History  Substance Use Topics  . Smoking status: Never Smoker   .  Smokeless tobacco: Never Used  . Alcohol Use: No    Allergies: No Known Allergies  Facility-administered medications prior to admission  Medication Dose Route Frequency Provider Last Rate Last Dose  . promethazine (PHENERGAN) injection 25 mg  25 mg Intravenous Q6H PRN Roderick Pee, MD   25 mg at 05/09/11 1731   Prescriptions prior to admission  Medication Sig Dispense Refill  . acetaminophen (TYLENOL) 500 MG tablet Take 1,000 mg by mouth every 6 (six) hours as needed for pain or fever.      Marland Kitchen aspirin-acetaminophen-caffeine (EXCEDRIN EXTRA STRENGTH) 250-250-65 MG per tablet Take 1 tablet by mouth every 6 (six) hours as needed (for migraines).       . Linaclotide (LINZESS) 145 MCG CAPS Take 145 mg by mouth daily.  14 capsule  0  . losartan-hydrochlorothiazide (HYZAAR) 100-12.5  MG per tablet Take 1 tablet by mouth daily.  90 tablet  3  . naproxen sodium (ANAPROX) 220 MG tablet Take 220 mg by mouth 2 (two) times daily as needed (for pain).       Marland Kitchen ZOLMitriptan (ZOMIG) 2.5 MG tablet Take 2.5 mg by mouth as needed for migraine.      . [DISCONTINUED] ZOLMitriptan (ZOMIG) 2.5 MG tablet Take 1 tablet (2.5 mg total) by mouth as needed.  10 tablet  6    Review of Systems  Constitutional: Positive for fever, chills and malaise/fatigue.  HENT: Positive for nosebleeds and congestion.   Respiratory: Positive for shortness of breath. Negative for cough, hemoptysis and wheezing.   Cardiovascular: Negative for chest pain, palpitations and leg swelling.  Gastrointestinal: Positive for nausea and abdominal pain (right-sided). Negative for vomiting, diarrhea, constipation and blood in stool.  Genitourinary: Negative for dysuria.  Neurological: Positive for weakness and headaches.    Physical Exam   Blood pressure 101/57, pulse 101, temperature 101 F (38.3 C), temperature source Oral, resp. rate 22, SpO2 94.00%.  Physical Exam Physical Examination:  General appearance - in mild to moderate distress and ill-appearing Mouth - mucous membranes moist, pharynx normal without lesions Chest - clear to auscultation, no wheezes, rales or rhonchi, symmetric air entry Heart - S1 and S2 normal, no murmurs noted, no gallops noted, tachycardic  Abdomen - tenderness noted over RUQ and RLQ, positive for guarding, no rebound tenderness noted, bowel sounds normal, scars from previous incisions without drainage or erythema, no abdominal bruising  Extremities - peripheral pulses normal, no pedal edema, no clubbing or cyanosis Skin - normal coloration and turgor, no rashes, no suspicious skin lesions noted   MAU Course  Procedures  Results for orders placed during the hospital encounter of 06/22/12 (from the past 24 hour(s))  CBC WITH DIFFERENTIAL     Status: Abnormal   Collection Time     06/22/12  2:38 PM      Result Value Range   WBC 11.9 (*) 4.0 - 10.5 K/uL   RBC 4.24  3.87 - 5.11 MIL/uL   Hemoglobin 12.6  12.0 - 15.0 g/dL   HCT 78.2  95.6 - 21.3 %   MCV 86.3  78.0 - 100.0 fL   MCH 29.7  26.0 - 34.0 pg   MCHC 34.4  30.0 - 36.0 g/dL   RDW 08.6  57.8 - 46.9 %   Platelets 229  150 - 400 K/uL   Neutrophils Relative 80 (*) 43 - 77 %   Neutro Abs 9.5 (*) 1.7 - 7.7 K/uL   Lymphocytes Relative 11 (*) 12 - 46 %  Lymphs Abs 1.3  0.7 - 4.0 K/uL   Monocytes Relative 7  3 - 12 %   Monocytes Absolute 0.8  0.1 - 1.0 K/uL   Eosinophils Relative 2  0 - 5 %   Eosinophils Absolute 0.3  0.0 - 0.7 K/uL   Basophils Relative 0  0 - 1 %   Basophils Absolute 0.0  0.0 - 0.1 K/uL  URINALYSIS, ROUTINE W REFLEX MICROSCOPIC     Status: Abnormal   Collection Time    06/22/12  3:40 PM      Result Value Range   Color, Urine YELLOW  YELLOW   APPearance CLEAR  CLEAR   Specific Gravity, Urine 1.020  1.005 - 1.030   pH 6.5  5.0 - 8.0   Glucose, UA NEGATIVE  NEGATIVE mg/dL   Hgb urine dipstick SMALL (*) NEGATIVE   Bilirubin Urine SMALL (*) NEGATIVE   Ketones, ur 15 (*) NEGATIVE mg/dL   Protein, ur 30 (*) NEGATIVE mg/dL   Urobilinogen, UA 1.0  0.0 - 1.0 mg/dL   Nitrite NEGATIVE  NEGATIVE   Leukocytes, UA NEGATIVE  NEGATIVE  URINE MICROSCOPIC-ADD ON     Status: None   Collection Time    06/22/12  3:40 PM      Result Value Range   Squamous Epithelial / LPF RARE  RARE   WBC, UA 0-2  <3 WBC/hpf   RBC / HPF 0-2  <3 RBC/hpf   Urine-Other MUCOUS PRESENT     Ct Abdomen Pelvis W Contrast  06/22/2012  *RADIOLOGY REPORT*  Clinical Data: Post Op pain and fever status post hysterectomy  CT ABDOMEN AND PELVIS WITH CONTRAST  Technique:  Multidetector CT imaging of the abdomen and pelvis was performed following the standard protocol during bolus administration of intravenous contrast.  Contrast: OMNIPAQUE IOHEXOL 300 MG/ML  SOLN  Comparison: Abdominal ultrasound 08/28/2011  Findings:  Lower Chest:   Linear atelectasis in the bilateral lower lobes with mild elevation of the left hemidiaphragm.  Negative for focal consolidation or suspicious nodule.  No pleural effusion. Visualized cardiac structures within normal limits for size.  No pericardial effusion.  The distal thoracic esophagus is unremarkable.  Abdomen: Unremarkable CT appearance of the stomach, duodenum, spleen, adrenal glands, and pancreas.  Normal appearance of the hepatic contour and parenchyma.  No focal hepatic lesion. Gallbladder is unremarkable. No intra or extrahepatic biliary ductal dilatation.  Normal appearance of the kidneys.  No hydronephrosis, nephrolithiasis or focal solid lesion.  Normal-caliber large and small bowel throughout the abdomen.  No evidence of bowel obstruction.  The normal appendix is identified in the right lower quadrant.  Unremarkable terminal ileum.  There is a small amount of non loculated ascites in the anatomic pelvis.  Pelvis: Surgical changes of hysterectomy.  The bilateral ovaries are identified and are unremarkable by CT appearance.  There is a relatively small amount of non loculated free fluid in the anatomic pelvis.  There is some mild enhancement of the prerectal fascia and a recess of Douglas.  No intra-abdominal free air or clear abscess. Unremarkable appearance of the bladder. Trace subcutaneous air anterior to the anterior abdominal wall in the deep subcutaneous fat of the lower abdomen.  Bones: No acute fracture or aggressive appearing lytic or blastic osseous lesion. Transitional anatomy with sacralization on the left at L5.  Vascular: Trace atherosclerotic vascular disease the infrarenal aorta without aneurysmal dilatation or significant stenosis.  The visualized veins are patent.  IMPRESSION:  1.  Small amount of  intermediate attenuation, non loculated ascites in the anatomic pelvis consistent with recent surgical change.  No loculated collection to suggest developing abscess. 2.  Small amount of  subcutaneous emphysema in the deep subcutaneous fat anterior to the lower abdominal wall also consistent with recent surgical change. 3.  Bibasilar subsegmental atelectasis.   Original Report Authenticated By: Malachy Moan, M.D.    Feeling better with Toradol 60 mg IM and Percocet 5/325 #2 PO  Assessment and Plan    Adela Glimpse 06/22/2012, 3:56 PM   I saw and examined patient along with student and agree with above note.   Cerys Winget 06/22/2012 7:19 PM  47 y.o. female 5 days s/p LAVH with fever and pain - CT WNL, sl elevated WBC and Temp 101 in MAU Treat for possible cuff cellulitis with Augmentin and Flagyl bid x 14 days Encouraged regular use of pain medications to keep pain well controlled, Colace bid F/U with her own doctor within 48 hours, precautions rev'd    Medication List    TAKE these medications       acetaminophen 500 MG tablet  Commonly known as:  TYLENOL  Take 1,000 mg by mouth every 6 (six) hours as needed for pain or fever.     amoxicillin-clavulanate 875-125 MG per tablet  Commonly known as:  AUGMENTIN  Take 1 tablet by mouth 2 (two) times daily.     docusate sodium 100 MG capsule  Commonly known as:  COLACE  Take 1 capsule (100 mg total) by mouth 2 (two) times daily.     EXCEDRIN EXTRA STRENGTH 250-250-65 MG per tablet  Generic drug:  aspirin-acetaminophen-caffeine  Take 1 tablet by mouth every 6 (six) hours as needed (for migraines).     Linaclotide 145 MCG Caps  Commonly known as:  LINZESS  Take 145 mg by mouth daily.     losartan-hydrochlorothiazide 100-12.5 MG per tablet  Commonly known as:  HYZAAR  Take 1 tablet by mouth daily.     metroNIDAZOLE 500 MG tablet  Commonly known as:  FLAGYL  Take 1 tablet (500 mg total) by mouth 2 (two) times daily.     naproxen sodium 220 MG tablet  Commonly known as:  ANAPROX  Take 220 mg by mouth 2 (two) times daily as needed (for pain).     ZOLMitriptan 2.5 MG tablet  Commonly known as:  ZOMIG   Take 2.5 mg by mouth as needed for migraine.

## 2012-06-22 NOTE — Patient Instructions (Signed)
Go directly to [facility] emergency room for evaluation

## 2012-06-22 NOTE — MAU Note (Signed)
Pt has 4 laparascopic incisions - one @ umbilicus, one above umbilicus, one on each side.  Incisions are dry, well approximated, steri strips intact.  No drainage or redness noted.

## 2012-06-22 NOTE — MAU Note (Signed)
Had LAVH at [facility] on 02/12.  Started yesterday, chills, achy. Pain in low back, nausea with movement. Fever off and on. 100 when last checked. Highest 101.

## 2012-09-17 ENCOUNTER — Other Ambulatory Visit: Payer: Self-pay | Admitting: Family Medicine

## 2012-10-26 ENCOUNTER — Other Ambulatory Visit: Payer: Self-pay | Admitting: Family Medicine

## 2012-11-23 ENCOUNTER — Other Ambulatory Visit: Payer: Self-pay | Admitting: Family Medicine

## 2012-11-23 DIAGNOSIS — N6310 Unspecified lump in the right breast, unspecified quadrant: Secondary | ICD-10-CM

## 2012-11-30 ENCOUNTER — Other Ambulatory Visit: Payer: Self-pay | Admitting: Family Medicine

## 2012-11-30 ENCOUNTER — Ambulatory Visit
Admission: RE | Admit: 2012-11-30 | Discharge: 2012-11-30 | Disposition: A | Discharge status: Home / Self Care | Payer: 59 | Source: Ambulatory Visit | Attending: Family Medicine | Admitting: Family Medicine

## 2012-11-30 DIAGNOSIS — N6310 Unspecified lump in the right breast, unspecified quadrant: Secondary | ICD-10-CM

## 2012-12-16 ENCOUNTER — Other Ambulatory Visit: Payer: Self-pay | Admitting: Family Medicine

## 2012-12-16 ENCOUNTER — Ambulatory Visit
Admission: RE | Admit: 2012-12-16 | Discharge: 2012-12-16 | Disposition: A | Discharge status: Home / Self Care | Payer: 59 | Source: Ambulatory Visit | Attending: Family Medicine | Admitting: Family Medicine

## 2012-12-16 DIAGNOSIS — N6310 Unspecified lump in the right breast, unspecified quadrant: Secondary | ICD-10-CM

## 2013-01-06 ENCOUNTER — Telehealth: Payer: Self-pay | Admitting: Family Medicine

## 2013-01-06 NOTE — Telephone Encounter (Signed)
Patient Information:  Caller Name: Suella  Phone: [phone]  Patient: [name], [name]  Gender: Female  DOB: 12-04-1965  Age: 47 Years  PCP: Kelle [place]/[facility])  Pregnant: No  Office Follow Up:  Does the office need to follow up with this patient?: No  Instructions For The Office: N/A  RN Note:  Pt has dry Cough x1 week, not responding to home care.  Discussed warm tea w/ honey, Mucinex DM for Cough. Appt scheduled for 0915 w/ Dr Selena Batten on 9-4.  Symptoms  Reason For Call & Symptoms: Cough  Reviewed Health History In EMR: Yes  Reviewed Medications In EMR: Yes  Reviewed Allergies In EMR: Yes  Reviewed Surgeries / Procedures: Yes  Date of Onset of Symptoms: 12/30/2012 OB / GYN:  LMP: Unknown  Guideline(s) Used:  Cough  Disposition Per Guideline:   See Today or Tomorrow in Office  Reason For Disposition Reached:   Continuous (nonstop) coughing interferes with work or school and no improvement using cough treatment per Care Advice  Advice Given:  N/A  Patient Will Follow Care Advice:  YES  Appointment Scheduled:  01/07/2013 09:15:00 Appointment Scheduled Provider:  Kriste Basque (Family Practice)

## 2013-01-06 NOTE — Telephone Encounter (Signed)
Call back attempted at 1355 on 9-3, vmail left.

## 2013-01-07 ENCOUNTER — Encounter: Payer: Self-pay | Admitting: Family Medicine

## 2013-01-07 ENCOUNTER — Ambulatory Visit (INDEPENDENT_AMBULATORY_CARE_PROVIDER_SITE_OTHER): Payer: 59 | Admitting: Family Medicine

## 2013-01-07 VITALS — BP 108/70 | Temp 98.7°F | Wt 169.0 lb

## 2013-01-07 DIAGNOSIS — J309 Allergic rhinitis, unspecified: Secondary | ICD-10-CM

## 2013-01-07 DIAGNOSIS — J399 Disease of upper respiratory tract, unspecified: Secondary | ICD-10-CM

## 2013-01-07 DIAGNOSIS — J398 Other specified diseases of upper respiratory tract: Secondary | ICD-10-CM

## 2013-01-07 MED ORDER — FLUTICASONE PROPIONATE 50 MCG/ACT NA SUSP: 2.0000 | Freq: Every day | NASAL | Status: DC

## 2013-01-07 NOTE — Progress Notes (Signed)
Chief Complaint  Patient presents with  . Cough    hoarseness    HPI:  [name] is here for an acute visit for a cough: -started 1 week ago -symptoms: started 4-5 days ago sore throat, drainage, cough, hoarseness, ears full, has seasonal allergies too this time of year with lots of nasal symptoms and sneezing, takes zyrtec sometimes for this -denies: fevers, tick bites, NVD, SOB  -has tried cough drops, hycodan - refilled from last year when had this   ROS: See pertinent positives and negatives per HPI.  Past Medical History  Diagnosis Date  . Hyperlipidemia   . Pulmonary nodules     not seen on plain cxr, first detected 1/07 re ct 10/08 pos IPPD > 15 mm 12/09 FOB/lavage 04/20/08 neg afb smear  . Migraine headache   . Iron deficiency anemia   . PPD positive   . Hyperthyroidism     hx 47 years old- oral meds  . Bell's palsy     with res left lag  . External hemorrhoids   . History of pneumonia   . PVC's (premature ventricular contractions)     Past Surgical History  Procedure Laterality Date  . Tubal ligation    . Endometrial ablation    . Hemorrhoid surgery      x 3  . Colonoscopy  05/04/2009    prolapsing external hemorrhoids  . Abdominal hysterectomy      Family History  Problem Relation Age of Onset  . Heart disease Maternal Grandmother     aunts, uncles, sister  . Kidney failure Sister   . Lung cancer      aunts and uncles  . Colon cancer Maternal Aunt     aunts and uncles  . Colon cancer Maternal Uncle     History   Social History  . Marital Status: Divorced    Spouse Name: N/A    Number of Children: 2  . 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 Smoking status: Never Smoker   . Smokeless tobacco: Never Used  . Alcohol Use: No  . Drug Use: No  . Sexual Activity: None   Other Topics Concern  . None   Social History Narrative   Divorced with children    Occupation Corporate investment banker   Possible TB exposure 2008 (after nodules found)   From [place] lived there and [place]   Never owned birds          Current outpatient prescriptions:acetaminophen (TYLENOL) 500 MG tablet, Take 1,000 mg by mouth every 6 (six) hours as needed for pain or fever., Disp: , Rfl: ;  aspirin-acetaminophen-caffeine (EXCEDRIN EXTRA STRENGTH) 250-250-65 MG per tablet, Take 1 tablet by mouth every 6 (six) hours as needed (for migraines). , Disp: , Rfl:  docusate sodium (COLACE) 100 MG capsule, Take 1 capsule (100 mg total) by mouth 2 (two) times daily., Disp: 30 capsule, Rfl: 2;  HYDROcodone-homatropine (HYCODAN) 5-1.5 MG/5ML syrup, TAKE ONE-HALF TO ONE TEASPOONFUL BY MOUTH AT BEDTIME AS NEEDED FOR COUGH, Disp: 120 mL, Rfl: 0;  Linaclotide (LINZESS) 145 MCG CAPS, Take 145 mg by mouth daily., Disp: 14 capsule, Rfl: 0 losartan-hydrochlorothiazide (HYZAAR) 100-12.5 MG per tablet, TAKE ONE TABLET BY MOUTH EVERY DAY, Disp: 90 tablet, Rfl: 0;  metroNIDAZOLE (FLAGYL) 500 MG tablet, Take 1 tablet (500 mg total) by mouth 2 (two) times daily., Disp: 14 tablet, Rfl: 0;  naproxen sodium (ANAPROX) 220 MG tablet, Take 220 mg by mouth 2 (two) times daily as needed (for pain). , Disp: , Rfl:  ZOLMitriptan (ZOMIG) 2.5 MG tablet, Take 2.5 mg by mouth as needed for migraine., Disp: , Rfl: ;  amoxicillin-clavulanate (AUGMENTIN) 875-125 MG per tablet, Take 1 tablet by mouth 2 (two) times daily., Disp: 14 tablet, Rfl: 0;  fluticasone (FLONASE) 50 MCG/ACT nasal spray, Place 2 sprays into the nose daily., Disp: 16 g, Rfl: 2 Current facility-administered medications:promethazine (PHENERGAN) injection 25 mg, 25 mg, Intravenous, Q6H PRN, Roderick Pee, MD, 25 mg at 05/09/11 1731  EXAM:  Filed Vitals:   01/07/13 0933  BP: 108/70  Temp: 98.7 F (37.1 C)    Body mass index is 26.46 kg/(m^2).  GENERAL: vitals reviewed and listed above, alert, oriented, appears well hydrated and in no acute  distress  HEENT: atraumatic, conjunttiva clear, no obvious abnormalities on inspection of external nose and ears, normal appearance of ear canals and TMs, clear nasal congestion with boggy turbinates, mild post oropharyngeal erythema with PND, no tonsillar edema or exudate, no sinus TTP  NECK: no obvious masses on inspection  LUNGS: clear to auscultation bilaterally, no wheezes, rales or rhonchi, good air movement  CV: HRRR, no peripheral edema  MS: moves all extremities without noticeable abnormality  PSYCH: pleasant and cooperative, no obvious depression or anxiety  ASSESSMENT AND PLAN:  Discussed the following assessment and plan:  Upper respiratory disease  Allergic rhinitis - Plan: fluticasone (FLONASE) 50 MCG/ACT nasal spray  -Acute VURI likely with underlying AR -Recommendations per orders and instructions, risks and use of medications and return precautions discussed. -Patient advised to return or notify a doctor immediately if symptoms worsen or persist or new concerns arise.  Patient Instructions  INSTRUCTIONS FOR UPPER RESPIRATORY INFECTION:  -plenty of rest and fluids  -whisper and use voice sparingly, do not force   -use zyrtec nightly and start flonase 2 sprays each side daily  -nasal saline wash 2-3 times daily (use prepackaged nasal saline or bottled/distilled water if making your own)   -can use afrin nasal spray for drainage and nasal congestion - but do NOT use longer then 3-4 days  -can use tylenol or ibuprofen as directed for aches and sorethroat  -in the winter time, using a humidifier at night is helpful (please follow cleaning instructions)  -if you are taking a cough medication - use only as directed, may also try a teaspoon of honey to coat the throat and throat lozenges  -for sore throat, salt water gargles can help  -follow up if you have fevers, facial pain, tooth pain, difficulty breathing or are worsening or not getting better in 5-7  days      KIM, HANNAH R.

## 2013-01-07 NOTE — Patient Instructions (Addendum)
INSTRUCTIONS FOR UPPER RESPIRATORY INFECTION:  -plenty of rest and fluids  -whisper and use voice sparingly, do not force   -use zyrtec nightly and start flonase 2 sprays each side daily  -nasal saline wash 2-3 times daily (use prepackaged nasal saline or bottled/distilled water if making your own)   -can use afrin nasal spray for drainage and nasal congestion - but do NOT use longer then 3-4 days  -can use tylenol or ibuprofen as directed for aches and sorethroat  -in the winter time, using a humidifier at night is helpful (please follow cleaning instructions)  -if you are taking a cough medication - use only as directed, may also try a teaspoon of honey to coat the throat and throat lozenges  -for sore throat, salt water gargles can help  -follow up if you have fevers, facial pain, tooth pain, difficulty breathing or are worsening or not getting better in 5-7 days

## 2013-02-17 ENCOUNTER — Telehealth: Payer: Self-pay | Admitting: Family Medicine

## 2013-02-17 MED ORDER — LOSARTAN POTASSIUM-HCTZ 100-12.5 MG PO TABS: ORAL_TABLET | ORAL | Status: DC

## 2013-02-17 NOTE — Telephone Encounter (Signed)
Pt is calling to request a 3 month supply of her losartan-hydrochlorothiazide (HYZAAR) 100-12.5 MG per tablet be sent to [facility] in [place]. Please assist.

## 2013-06-25 ENCOUNTER — Telehealth: Payer: Self-pay | Admitting: Family Medicine

## 2013-06-25 MED ORDER — LOSARTAN POTASSIUM-HCTZ 100-12.5 MG PO TABS: ORAL_TABLET | ORAL | Status: DC

## 2013-06-25 MED ORDER — ZOLMITRIPTAN 2.5 MG PO TABS: 2.5000 mg | ORAL_TABLET | ORAL | Status: DC | PRN

## 2013-06-25 NOTE — Telephone Encounter (Signed)
Pt needs refill on zomig and losartan-hctz  call into [facility] [place]. Pt has appt sch for 07-15-13

## 2013-07-15 ENCOUNTER — Encounter: Payer: Self-pay | Admitting: Family Medicine

## 2013-07-15 ENCOUNTER — Ambulatory Visit (INDEPENDENT_AMBULATORY_CARE_PROVIDER_SITE_OTHER): Payer: 59 | Admitting: Family Medicine

## 2013-07-15 VITALS — BP 120/76 | HR 89 | Temp 98.3°F | Resp 20 | Ht 67.0 in | Wt 175.0 lb

## 2013-07-15 DIAGNOSIS — J020 Streptococcal pharyngitis: Secondary | ICD-10-CM

## 2013-07-15 DIAGNOSIS — R252 Cramp and spasm: Secondary | ICD-10-CM

## 2013-07-15 DIAGNOSIS — I10 Essential (primary) hypertension: Secondary | ICD-10-CM

## 2013-07-15 LAB — CBC WITH DIFFERENTIAL/PLATELET
BASOS PCT: 0.6 % (ref 0.0–3.0)
Basophils Absolute: 0 10*3/uL (ref 0.0–0.1)
EOS PCT: 2.6 % (ref 0.0–5.0)
Eosinophils Absolute: 0.2 10*3/uL (ref 0.0–0.7)
HCT: 40 % (ref 36.0–46.0)
HEMOGLOBIN: 13.4 g/dL (ref 12.0–15.0)
LYMPHS PCT: 32.3 % (ref 12.0–46.0)
Lymphs Abs: 2.7 10*3/uL (ref 0.7–4.0)
MCHC: 33.5 g/dL (ref 30.0–36.0)
MCV: 89.9 fl (ref 78.0–100.0)
MONOS PCT: 6.2 % (ref 3.0–12.0)
Monocytes Absolute: 0.5 10*3/uL (ref 0.1–1.0)
NEUTROS ABS: 4.9 10*3/uL (ref 1.4–7.7)
Neutrophils Relative %: 58.3 % (ref 43.0–77.0)
Platelets: 279 10*3/uL (ref 150.0–400.0)
RBC: 4.45 Mil/uL (ref 3.87–5.11)
RDW: 13.2 % (ref 11.5–14.6)
WBC: 8.4 10*3/uL (ref 4.5–10.5)

## 2013-07-15 LAB — BASIC METABOLIC PANEL
BUN: 13 mg/dL (ref 6–23)
CALCIUM: 9.5 mg/dL (ref 8.4–10.5)
CO2: 30 meq/L (ref 19–32)
Chloride: 104 mEq/L (ref 96–112)
Creatinine, Ser: 1.1 mg/dL (ref 0.4–1.2)
GFR: 70.32 mL/min (ref >60.00)
Glucose, Bld: 83 mg/dL (ref 70–99)
Potassium: 3.1 mEq/L — ABNORMAL LOW (ref 3.5–5.1)
SODIUM: 139 meq/L (ref 135–145)

## 2013-07-15 LAB — FERRITIN: Ferritin: 142.4 ng/mL (ref 10.0–291.0)

## 2013-07-15 MED ORDER — LOSARTAN POTASSIUM-HCTZ 100-12.5 MG PO TABS: ORAL_TABLET | ORAL | Status: DC

## 2013-07-15 MED ORDER — AMOXICILLIN 500 MG PO CAPS: ORAL_CAPSULE | ORAL | Status: DC

## 2013-07-15 NOTE — Progress Notes (Signed)
   Subjective:    Patient ID: DELIANA AVALOS, female    DOB: July 10, 1965, 48 y.o.   MRN: [id number]  HPI Ms. Colquhoun is a 48 year old female who comes in today for evaluation of 2 problems  She states she went to an urgent care clinic with a sore throat and a fever a strep test was positive but she was given azithromycin. The drug of choice for strep as penicillin  She also states for the last 3 weeks she's been having leg cramps. It can occur anytime of the day not just at night.  She takes her Hyzaar 100-12.5 daily for hypertension   Review of Systems Review of systems otherwise negative    Objective:   Physical Exam  Well-developed well-nourished female no acute distress vital signs stable she is afebrile examination or cavity shows erythema and bilateral exudates neck was tender but no adenopathy legs were normal      Assessment & Plan:  Bacterial pharyngitis probably strep by history,,,,,,,,, amoxicillin  Leg cramps check labs

## 2013-07-15 NOTE — Progress Notes (Signed)
Pre-visit discussion using our clinic review tool. No additional management support is needed unless otherwise documented below in the visit note.  

## 2013-07-15 NOTE — Patient Instructions (Signed)
Gargle with salt water 3-4 times daily  Motrin 400 mg 3 times daily for pain  Amoxicillin 500 mg,,,,,,,,,,,,,,,, 2 tabs twice daily until the bottle is empty  We will call you he gets her lab work back

## 2013-07-16 ENCOUNTER — Telehealth: Payer: Self-pay | Admitting: Family Medicine

## 2013-07-16 NOTE — Telephone Encounter (Signed)
Relevant patient education assigned to patient using Emmi. ° °

## 2013-07-19 ENCOUNTER — Telehealth: Payer: Self-pay | Admitting: Family Medicine

## 2013-07-19 NOTE — Telephone Encounter (Signed)
Pt would like a cb w/ lab results and also would like a refill of the cough med HYDROcodone-homatropine (HYCODAN) 5-1.5 MG/5ML syrup

## 2013-07-20 ENCOUNTER — Other Ambulatory Visit: Payer: Self-pay | Admitting: Family Medicine

## 2013-07-20 DIAGNOSIS — E876 Hypokalemia: Secondary | ICD-10-CM

## 2013-07-20 MED ORDER — POTASSIUM CHLORIDE CRYS ER 20 MEQ PO TBCR: 20.0000 meq | EXTENDED_RELEASE_TABLET | Freq: Every day | ORAL | Status: DC

## 2013-07-20 MED ORDER — HYDROCODONE-HOMATROPINE 5-1.5 MG/5ML PO SYRP: ORAL_SOLUTION | ORAL | Status: DC

## 2013-08-30 ENCOUNTER — Telehealth: Payer: Self-pay | Admitting: Family Medicine

## 2013-08-30 MED ORDER — ZOLMITRIPTAN 2.5 MG PO TABS: 2.5000 mg | ORAL_TABLET | ORAL | Status: DC | PRN

## 2013-08-30 NOTE — Telephone Encounter (Signed)
Pt req rx ZOLMitriptan (ZOMIG) 2.5 MG tablet

## 2013-09-07 ENCOUNTER — Telehealth: Payer: Self-pay | Admitting: Family Medicine

## 2013-09-07 DIAGNOSIS — I10 Essential (primary) hypertension: Secondary | ICD-10-CM

## 2013-09-07 MED ORDER — LOSARTAN POTASSIUM-HCTZ 100-12.5 MG PO TABS: ORAL_TABLET | ORAL | Status: DC

## 2013-09-07 NOTE — Telephone Encounter (Signed)
Pt needs losartan-hctz 100-25 mg #90 with refills sent to [facility]

## 2013-11-22 ENCOUNTER — Other Ambulatory Visit: Payer: Self-pay

## 2013-11-22 DIAGNOSIS — Z1231 Encounter for screening mammogram for malignant neoplasm of breast: Secondary | ICD-10-CM

## 2013-11-26 ENCOUNTER — Encounter (INDEPENDENT_AMBULATORY_CARE_PROVIDER_SITE_OTHER): Payer: Self-pay

## 2013-11-26 ENCOUNTER — Ambulatory Visit
Admission: RE | Admit: 2013-11-26 | Discharge: 2013-11-26 | Disposition: A | Discharge status: Home / Self Care | Payer: 59 | Source: Ambulatory Visit

## 2013-11-26 DIAGNOSIS — Z1231 Encounter for screening mammogram for malignant neoplasm of breast: Secondary | ICD-10-CM

## 2014-03-07 ENCOUNTER — Encounter: Payer: Self-pay | Admitting: Family Medicine

## 2014-03-11 ENCOUNTER — Ambulatory Visit (INDEPENDENT_AMBULATORY_CARE_PROVIDER_SITE_OTHER): Payer: 59 | Admitting: Family Medicine

## 2014-03-11 ENCOUNTER — Encounter: Payer: Self-pay | Admitting: Family Medicine

## 2014-03-11 VITALS — BP 130/82 | HR 82 | Temp 97.9°F | Ht 67.0 in | Wt 171.9 lb

## 2014-03-11 DIAGNOSIS — W57XXXA Bitten or stung by nonvenomous insect and other nonvenomous arthropods, initial encounter: Secondary | ICD-10-CM

## 2014-03-11 DIAGNOSIS — T148 Other injury of unspecified body region: Secondary | ICD-10-CM

## 2014-03-11 NOTE — Progress Notes (Signed)
HPI:  Acute visit for:  1) Insect Bite: -scattered itchy papules throughout - new ones pop up and heal over last 1-2 weeks -did travel a few times in the last month -denies: fever, malaise, drainage from wounds, pets in home, new bathing, detergent or emmollient products   ROS: See pertinent positives and negatives per HPI.  Past Medical History  Diagnosis Date  . Hyperlipidemia   . Pulmonary nodules     not seen on plain cxr, first detected 1/07 re ct 10/08 pos IPPD > 15 mm 12/09 FOB/lavage 04/20/08 neg afb smear  . Migraine headache   . Iron deficiency anemia   . PPD positive   . Hyperthyroidism     hx 48 years old- oral meds  . Bell's palsy     with res left lag  . External hemorrhoids   . History of pneumonia   . PVC's (premature ventricular contractions)     Past Surgical History  Procedure Laterality Date  . Tubal ligation    . Endometrial ablation    . Hemorrhoid surgery      x 3  . Colonoscopy  05/04/2009    prolapsing external hemorrhoids  . Abdominal hysterectomy      Family History  Problem Relation Age of Onset  . Heart disease Maternal Grandmother     aunts, uncles, sister  . Kidney failure Sister   . Lung cancer      aunts and uncles  . Colon cancer Maternal Aunt     aunts and uncles  . Colon cancer Maternal Uncle     History   Social History  . Marital Status: Divorced    Spouse Name: N/A    Number of Children: 2  . Years of Education: N/A   Occupational History  . 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 Smoking status: Never Smoker   . Smokeless tobacco: Never Used  . Alcohol Use: No  . Drug Use: No  . Sexual Activity: None   Other Topics Concern  . None   Social History Narrative   Divorced with children   Occupation Media planner   Possible TB exposure 2008 (after nodules found)   From [place] lived there and [place]   Never owned birds          Current outpatient prescriptions:  aspirin-acetaminophen-caffeine ([place]) [phone] MG per tablet, Take 1 tablet by mouth every 6 (six) hours as needed (for migraines). , Disp: , Rfl: ;  HYDROcodone-homatropine (HYCODAN) 5-1.5 MG/5ML syrup, TAKE ONE-HALF TO ONE TEASPOONFUL BY MOUTH AT BEDTIME AS NEEDED FOR COUGH, Disp: 120 mL, Rfl: 0 losartan-hydrochlorothiazide (HYZAAR) 100-12.5 MG per tablet, TAKE ONE TABLET BY MOUTH EVERY DAY, Disp: 90 tablet, Rfl: 1;  ZOLMitriptan (ZOMIG) 2.5 MG tablet, Take 1 tablet (2.5 mg total) by mouth as needed for migraine., Disp: 10 tablet, Rfl: 3 Current facility-administered medications: promethazine (PHENERGAN) injection 25 mg, 25 mg, Intravenous, Q6H PRN, Dorena Cookey, MD, 25 mg at 05/09/11 1731  EXAM:  Filed Vitals:   03/11/14 1037  BP: 130/82  Pulse: 82  Temp: 97.9 F (36.6 C)    Body mass index is 26.92 kg/(m^2).  GENERAL: vitals reviewed and listed above, alert, oriented, appears well hydrated and in no acute distress  HEENT: atraumatic, conjunttiva clear, no obvious abnormalities on inspection of external nose and ears  NECK: no obvious masses on inspection  SKIN: scattered erythematous papules about 4-58mm in diameter in various stages  of healing on extremeties and trunk  MS: moves all extremities without noticeable abnormality  PSYCH: pleasant and cooperative, no obvious depression or anxiety  ASSESSMENT AND PLAN:  Discussed the following assessment and plan:  Insect bite  -these look like insect bites - mosquitoe, flea or less likely bed bugs -she will tx with topical low dose steroid/benadryl cream -search for source, consider exterminator -Patient advised to return or notify a doctor immediately if symptoms worsen or persist or new concerns arise.  There are no Patient Instructions on file for this visit.   Colin Benton R.

## 2014-03-11 NOTE — Progress Notes (Signed)
Pre visit review using our clinic review tool, if applicable. No additional management support is needed unless otherwise documented below in the visit note. 

## 2014-03-14 ENCOUNTER — Encounter: Payer: Self-pay | Admitting: Family Medicine

## 2014-03-14 ENCOUNTER — Ambulatory Visit (INDEPENDENT_AMBULATORY_CARE_PROVIDER_SITE_OTHER): Payer: 59 | Admitting: Family Medicine

## 2014-03-14 VITALS — BP 110/80 | Temp 98.1°F | Wt 175.0 lb

## 2014-03-14 DIAGNOSIS — R1011 Right upper quadrant pain: Secondary | ICD-10-CM

## 2014-03-14 DIAGNOSIS — T7840XA Allergy, unspecified, initial encounter: Secondary | ICD-10-CM | POA: Insufficient documentation

## 2014-03-14 DIAGNOSIS — T7840XD Allergy, unspecified, subsequent encounter: Secondary | ICD-10-CM

## 2014-03-14 NOTE — Progress Notes (Signed)
Pre visit review using our clinic review tool, if applicable. No additional management support is needed unless otherwise documented below in the visit note. 

## 2014-03-14 NOTE — Progress Notes (Signed)
   Subjective:    Patient ID: [name], female    DOB: [birth year], 48 y.o.   MRN: [id number]  HPI [name] is a 48 year old female nonsmoker who comes in today for evaluation of 2 problems  She was here last Friday and saw Dr. Maudie Mercury  for evaluation of a rash.. She's been trying over-the-counter medications but is not helping the rash is getting worse. Lesions are read an. Headache. Recurred about 2 days after a bug bite to her right lower extremity.  For the past 6 months she's been having right upper quadrant abdominal pain after eating especially greasy and fatty foods. I one occasion pain was so severe she vomited. The pain tends to radiate into her back. She's had no fever.  Negative family history for gallbladder disease   Review of Systems    review of systems otherwise negative Objective:   Physical Exam Well-developed well-nourished female no acute distress vital signs stable she's afebrile examination skin shows multiple round red lesions consistent with a type reaction from the spider bite  Examination the abdomen the abdomen was flat the bowel sounds are normal liver spleen and kidneys not enlarged her some tenderness in the right upper quadrant to deep palpation but no rebound       Assessment & Plan:  Id reaction from a spider bite,,,,,,,, prednisone burst and taper  Right upper quadrant abdominal pain.......... Begin workup with ultrasound fat-free diet,,,,,, instructed if she develops severe abdominal pain to come directly the emergency room

## 2014-03-14 NOTE — Patient Instructions (Signed)
Prednisone 20 mg.......... 23 days and taper  Complete fat-free diet  We will get you set up for an ultrasound of your gallbladder ASAP in the meantime if he develops severe abdominal pain on a fat-free diet come directly to the emergency room

## 2014-03-18 ENCOUNTER — Ambulatory Visit
Admission: RE | Admit: 2014-03-18 | Discharge: 2014-03-18 | Disposition: A | Discharge status: Home / Self Care | Payer: 59 | Source: Ambulatory Visit | Attending: Family Medicine | Admitting: Family Medicine

## 2014-03-18 ENCOUNTER — Telehealth: Payer: Self-pay | Admitting: Family Medicine

## 2014-03-18 DIAGNOSIS — K8020 Calculus of gallbladder without cholecystitis without obstruction: Secondary | ICD-10-CM

## 2014-03-18 DIAGNOSIS — R1011 Right upper quadrant pain: Secondary | ICD-10-CM

## 2014-03-18 NOTE — Telephone Encounter (Signed)
Patient states she just completed the scan for her gallbladder and would like to know the results.  She is aware that Dr. Sherren Mocha is out of the office today but would like for you give her the results so she doesn't have to wait till Monday.  Patient would like a callback.

## 2014-03-21 ENCOUNTER — Other Ambulatory Visit: Payer: 59

## 2014-03-22 ENCOUNTER — Telehealth: Payer: Self-pay | Admitting: Family Medicine

## 2014-03-22 NOTE — Telephone Encounter (Signed)
Pt states she called [facility] and they have no information about pt.  Pt states she is having discomfort and cannot use the restroom. If she cannot see someone asap, pt would like to go somewhere else.. pls advise.

## 2014-03-22 NOTE — Telephone Encounter (Signed)
I  Called [facility] to follow up on the referral and refaxed , they will call the patient to schedule

## 2014-03-23 ENCOUNTER — Encounter: Payer: Self-pay | Admitting: Family Medicine

## 2014-03-23 ENCOUNTER — Ambulatory Visit (INDEPENDENT_AMBULATORY_CARE_PROVIDER_SITE_OTHER): Payer: 59 | Admitting: Family Medicine

## 2014-03-23 VITALS — BP 140/90 | HR 80 | Temp 99.2°F | Wt 174.0 lb

## 2014-03-23 DIAGNOSIS — R21 Rash and other nonspecific skin eruption: Secondary | ICD-10-CM

## 2014-03-23 NOTE — Progress Notes (Signed)
Pre visit review using our clinic review tool, if applicable. No additional management support is needed unless otherwise documented below in the visit note. 

## 2014-03-23 NOTE — Progress Notes (Signed)
   Subjective:    Patient ID: [name], female    DOB: 1965/09/25, 48 y.o.   MRN: [id number]  HPI Patient seen with rash which has gone now for several weeks. She has pruritic rash which is on her trunk and extremities. She tried topical steroids and antihistamines initially without improvement. She was recently placed on oral prednisone which has not made much difference yet. She's not had any fevers or chills. No recent change of soaps or detergent. No history of similar rash previously. Her only prescription medication is losartan -HCTZ.  Past Medical History  Diagnosis Date  . Hyperlipidemia   . Pulmonary nodules     not seen on plain cxr, first detected 1/07 re ct 10/08 pos IPPD > 15 mm 12/09 FOB/lavage 04/20/08 neg afb smear  . Migraine headache   . Iron deficiency anemia   . PPD positive   . Hyperthyroidism     hx 48 years old- oral meds  . Bell's palsy     with res left lag  . External hemorrhoids   . History of pneumonia   . PVC's (premature ventricular contractions)    Past Surgical History  Procedure Laterality Date  . Tubal ligation    . Endometrial ablation    . Hemorrhoid surgery      x 3  . Colonoscopy  05/04/2009    prolapsing external hemorrhoids  . Abdominal hysterectomy      reports that she has never smoked. She has never used smokeless tobacco. She reports that she does not drink alcohol or use illicit drugs. family history includes Colon cancer in her maternal aunt and maternal uncle; Heart disease in her maternal grandmother; Kidney failure in her sister; Lung cancer in an other family member. No Known Allergies    Review of Systems  Constitutional: Negative for fever and chills.  Skin: Positive for rash.       Objective:   Physical Exam  Constitutional: She appears well-developed and well-nourished.  Cardiovascular: Normal rate and regular rhythm.   Skin: Rash noted.  Patient has somewhat oval plaque-like rash with slightly scaly  surface and somewhat reddish-brown underlying base with several scattered areas of rash on her trunk and upper extremities and neck as well as back.          Assessment & Plan:  Skin rash. Clinically, by appearance question lichenoid drug eruption. Her only prescription medication is losartan-hctz. We'll set up dermatology referral. We gave option of this versus skin punch biopsy. Doubt psoriasiform rash.

## 2014-03-29 ENCOUNTER — Other Ambulatory Visit: Payer: Self-pay | Admitting: Dermatology

## 2014-07-08 ENCOUNTER — Encounter: Payer: Self-pay | Admitting: Family Medicine

## 2014-07-08 ENCOUNTER — Ambulatory Visit (INDEPENDENT_AMBULATORY_CARE_PROVIDER_SITE_OTHER): Payer: BLUE CROSS/BLUE SHIELD | Admitting: Family Medicine

## 2014-07-08 VITALS — BP 128/88 | HR 73 | Temp 98.7°F | Ht 67.0 in | Wt 178.0 lb

## 2014-07-08 DIAGNOSIS — J069 Acute upper respiratory infection, unspecified: Secondary | ICD-10-CM

## 2014-07-08 MED ORDER — HYDROCODONE-HOMATROPINE 5-1.5 MG/5ML PO SYRP: 5.0000 mL | ORAL_SOLUTION | ORAL | Status: DC | PRN

## 2014-07-08 NOTE — Progress Notes (Signed)
   Subjective:    Patient ID: [name], female    DOB: 12-Nov-1965, 49 y.o.   MRN: [id number]  HPI Here for 2 days of scratchy throat, hoarseness, and a dry cough. No fever.    Review of Systems  Constitutional: Negative.   HENT: Positive for congestion, postnasal drip, sore throat and voice change. Negative for sinus pressure.   Eyes: Negative.   Respiratory: Positive for cough.        Objective:   Physical Exam  Constitutional: She appears well-developed and well-nourished.  HENT:  Right Ear: External ear normal.  Left Ear: External ear normal.  Nose: Nose normal.  Mouth/Throat: Oropharynx is clear and moist.  Eyes: Conjunctivae are normal.  Pulmonary/Chest: Effort normal and breath sounds normal.  Lymphadenopathy:    She has no cervical adenopathy.          Assessment & Plan:  Rest, drink fluids. Written out of work for today.

## 2014-07-08 NOTE — Progress Notes (Signed)
Pre visit review using our clinic review tool, if applicable. No additional management support is needed unless otherwise documented below in the visit note. 

## 2014-09-10 ENCOUNTER — Other Ambulatory Visit: Payer: Self-pay | Admitting: Family Medicine

## 2014-10-12 ENCOUNTER — Ambulatory Visit (INDEPENDENT_AMBULATORY_CARE_PROVIDER_SITE_OTHER): Payer: BLUE CROSS/BLUE SHIELD | Admitting: Family Medicine

## 2014-10-12 ENCOUNTER — Encounter: Payer: Self-pay | Admitting: Family Medicine

## 2014-10-12 VITALS — BP 128/82 | HR 93 | Temp 98.0°F | Wt 175.0 lb

## 2014-10-12 DIAGNOSIS — M7581 Other shoulder lesions, right shoulder: Secondary | ICD-10-CM

## 2014-10-12 NOTE — Patient Instructions (Signed)
Rotator Cuff Tendinitis  Rotator cuff tendinitis is inflammation of the tough, cord-like bands that connect muscle to bone (tendons) in your rotator cuff. Your rotator cuff is the collection of all the muscles and tendons that connect your arm to your shoulder. Your rotator cuff holds the head of your upper arm bone (humerus) in the cup (fossa) of your shoulder blade (scapula). CAUSES Rotator cuff tendinitis is usually caused by overusing the joint involved.  SIGNS AND SYMPTOMS  Deep ache in the shoulder also felt on the outside upper arm over the shoulder muscle.  Point tenderness over the area that is injured.  Pain comes on gradually and becomes worse with lifting the arm to the side (abduction) or turning it inward (internal rotation).  May lead to a chronic tear: When a rotator cuff tendon becomes inflamed, it runs the risk of losing its blood supply, causing some tendon fibers to die. This increases the risk that the tendon can fray and partially or completely tear. DIAGNOSIS Rotator cuff tendinitis is diagnosed by taking a medical history, performing a physical exam, and reviewing results of imaging exams. The medical history is useful to help determine the type of rotator cuff injury. The physical exam will include looking at the injured shoulder, feeling the injured area, and watching you do range-of-motion exercises. X-ray exams are typically done to rule out other causes of shoulder pain, such as fractures. MRI is the imaging exam usually used for significant shoulder injuries. Sometimes a dye study called CT arthrogram is done, but it is not as widely used as MRI. In some institutions, special ultrasound tests may also be used to aid in the diagnosis. TREATMENT  Less Severe Cases  Use of a sling to rest the shoulder for a short period of time. Prolonged use of the sling can cause stiffness, weakness, and loss of motion of the shoulder joint.  Anti-inflammatory medicines, such as  ibuprofen or naproxen sodium, may be prescribed. More Severe Cases  Physical therapy.  Use of steroid injections into the shoulder joint.  Surgery. HOME CARE INSTRUCTIONS   Use a sling or splint until the pain decreases. Prolonged use of the sling can cause stiffness, weakness, and loss of motion of the shoulder joint.  Apply ice to the injured area:  Put ice in a plastic bag.  Place a towel between your skin and the bag.  Leave the ice on for 20 minutes, 2-3 times a day.  Try to avoid use other than gentle range of motion while your shoulder is painful. Use the shoulder and exercise only as directed by your health care provider. Stop exercises or range of motion if pain or discomfort increases, unless directed otherwise by your health care provider.  Only take over-the-counter or prescription medicines for pain, discomfort, or fever as directed by your health care provider.  If you were given a shoulder sling and straps (immobilizer), do not remove it except as directed, or until you see a health care provider for a follow-up exam. If you need to remove it, move your arm as little as possible or as directed.  You may want to sleep on several pillows at night to lessen swelling and pain. SEEK IMMEDIATE MEDICAL CARE IF:   Your shoulder pain increases or new pain develops in your arm, hand, or fingers and is not relieved with medicines.  You have new, unexplained symptoms, especially increased numbness in the hands or loss of strength.  You develop any worsening of the problems  that brought you in for care.  Your arm, hand, or fingers are numb or tingling.  Your arm, hand, or fingers are swollen, painful, or turn white or blue. MAKE SURE YOU:  Understand these instructions.  Will watch your condition.  Will get help right away if you are not doing well or get worse. Document Released: 07/13/2003 Document Revised: 02/10/2013 Document Reviewed: 12/02/2012 [facility] Patient  Information 2015 [place], [place]. This information is not intended to replace advice given to you by your health care provider. Make sure you discuss any questions you have with your health care provider.

## 2014-10-12 NOTE — Progress Notes (Signed)
Pre visit review using our clinic review tool, if applicable. No additional management support is needed unless otherwise documented below in the visit note. 

## 2014-10-12 NOTE — Progress Notes (Signed)
   Subjective:    Patient ID: [name], female    DOB: 06/17/1965, 48 y.o.   MRN: [id number]  HPI Patient seen with one month history of right shoulder pain. No injury. She has achy pain - worse at night and frequently exacerbated by rolling on her shoulder. Radiates somewhat to the neck. She has tried Aleve with mild relief. No prior history of similar pain. Denies any overuse activities. Denies any upper extremity weakness. She has pain with abduction, internal rotation, and external rotation. Pain radiates to the mid aspect of the right arm laterally  Past Medical History  Diagnosis Date  . Hyperlipidemia   . Pulmonary nodules     not seen on plain cxr, first detected 1/07 re ct 10/08 pos IPPD > 15 mm 12/09 FOB/lavage 04/20/08 neg afb smear  . Migraine headache   . Iron deficiency anemia   . PPD positive   . Hyperthyroidism     hx 49 years old- oral meds  . Bell's palsy     with res left lag  . External hemorrhoids   . History of pneumonia   . PVC's (premature ventricular contractions)    Past Surgical History  Procedure Laterality Date  . Tubal ligation    . Endometrial ablation    . Hemorrhoid surgery      x 3  . Colonoscopy  05/04/2009    prolapsing external hemorrhoids  . Abdominal hysterectomy      reports that she has never smoked. She has never used smokeless tobacco. She reports that she does not drink alcohol or use illicit drugs. family history includes Colon cancer in her maternal aunt and maternal uncle; Heart disease in her maternal grandmother; Kidney failure in her sister; Lung cancer in an other family member. No Known Allergies    Review of Systems  Constitutional: Negative for fever and chills.  Neurological: Negative for weakness and numbness.       Objective:   Physical Exam  Constitutional: She appears well-developed and well-nourished.  Cardiovascular: Normal rate and regular rhythm.   Pulmonary/Chest: Effort normal and breath sounds  normal. No respiratory distress. She has no wheezes. She has no rales.  Musculoskeletal:  Right shoulder reveals full range of motion though she has significant pain with abduction greater than 90 and internal rotation. No biceps tenderness. No acromioclavicular tenderness  Neurological:  No definite rotator cuff weakness though she is limited in abduction and internal rotation secondary to pain. Reflexes are symmetric          Assessment & Plan:  Right shoulder pain. Suspect rotator cuff tendinitis. We offered corticosteroid injection and she declines. Set up physical therapy. She is starting to develop slightly restricted range of motion and at risk for adhesive capsulitis. We explained that we may need to control inflammation with steroid injecton before she can progress to physical therapy. She will follow-up in 2 weeks if not improving

## 2014-10-24 ENCOUNTER — Ambulatory Visit: Payer: BLUE CROSS/BLUE SHIELD | Admitting: Physical Therapy

## 2014-10-31 ENCOUNTER — Ambulatory Visit: Payer: BLUE CROSS/BLUE SHIELD | Attending: Family Medicine | Admitting: Physical Therapy

## 2014-10-31 DIAGNOSIS — M25611 Stiffness of right shoulder, not elsewhere classified: Secondary | ICD-10-CM | POA: Diagnosis not present

## 2014-10-31 DIAGNOSIS — M25511 Pain in right shoulder: Secondary | ICD-10-CM | POA: Diagnosis present

## 2014-10-31 DIAGNOSIS — R531 Weakness: Secondary | ICD-10-CM | POA: Diagnosis not present

## 2014-10-31 NOTE — Therapy (Signed)
[facility] [place], [place], [address] Phone: [phone]   Fax:  [phone]  Physical Therapy Evaluation  Patient Details  Name: [name] MRN: [id number] Date of Birth: [birth year] Referring Provider:  Eulas Post, MD  Encounter Date: 10/31/2014      PT End of Session - 10/31/14 0906    Visit Number 1   Number of Visits 12   Date for PT Re-Evaluation 12/12/14   PT Start Time 3716   PT Stop Time 0921   PT Time Calculation (min) 57 min      Past Medical History  Diagnosis Date  . Hyperlipidemia   . Pulmonary nodules     not seen on plain cxr, first detected 1/07 re ct 10/08 pos IPPD > 15 mm 12/09 FOB/lavage 04/20/08 neg afb smear  . Migraine headache   . Iron deficiency anemia   . PPD positive   . Hyperthyroidism     hx 49 years old- oral meds  . Bell's palsy     with res left lag  . External hemorrhoids   . History of pneumonia   . PVC's (premature ventricular contractions)     Past Surgical History  Procedure Laterality Date  . Tubal ligation    . Endometrial ablation    . Hemorrhoid surgery      x 3  . Colonoscopy  05/04/2009    prolapsing external hemorrhoids  . Abdominal hysterectomy      There were no vitals filed for this visit.  Visit Diagnosis:  Right shoulder pain - Plan: PT plan of care cert/re-cert  Stiffness of right shoulder joint - Plan: PT plan of care cert/re-cert  Generalized weakness - Plan: PT plan of care cert/re-cert      Subjective Assessment - 10/31/14 0832    Subjective Began experiencing Rt shoulder pain a couple of months ago. Shoulder aches a lot usually mid to upper humerus and sometimes goes up into her neck. Patient is not limited with her everyday activities but has pain. Aches constantlly at night.   Patient Stated Goals to get rid of pain   Currently in Pain? Yes   Pain Score 6    Pain Location Shoulder   Pain Orientation Right   Pain  Descriptors / Indicators Aching   Pain Radiating Towards neck   Pain Onset More than a month ago   Pain Frequency Constant   Aggravating Factors  sleeping on it, carrying laptop bag on right side.   Pain Relieving Factors aleve   Effect of Pain on Daily Activities does so with pain            [facility] PT Assessment - 10/31/14 0001    Assessment   Medical Diagnosis Rt RC tendinitis   Hand Dominance Right   Next MD Visit none   Precautions   Precautions None   Balance Screen   Has the patient fallen in the past 6 months No   Has the patient had a decrease in activity level because of a fear of falling?  No   Is the patient reluctant to leave their home because of a fear of falling?  No   Prior Function   Level of Independence Independent with basic ADLs   Vocation Full time employment   Vocation Requirements carrying laptop when travels   ROM / Strength   AROM / PROM / Strength AROM;Strength   AROM   AROM Assessment Site Shoulder   Right/Left Shoulder  Right;Left   Right Shoulder Flexion 80 Degrees  160 passive   Right Shoulder ABduction 127 Degrees  163 passive   Right Shoulder Internal Rotation 40 Degrees  75 passive   Right Shoulder External Rotation 20 Degrees  35 passive   Left Shoulder Flexion 140 Degrees  150 passive   Left Shoulder ABduction 145 Degrees  160 passive   Strength   Overall Strength Comments Left shoulder 5/5 except flex 4+/5; Rt elbow flex 4/5, ext 5/5   Strength Assessment Site Shoulder   Right/Left Shoulder Right   Right Shoulder Flexion 4-/5  pain   Right Shoulder Extension 4+/5  with pain   Right Shoulder ABduction 4-/5  pain   Right Shoulder Internal Rotation 4+/5   Right Shoulder External Rotation 4+/5  with pain   Palpation   Palpation comment Marked tenderness of right supra/infraspinatus, UT, RC tendon insertions x 4.   Special Tests    Special Tests Rotator Cuff Impingement   Rotator Cuff Impingment tests Neer impingement  test;Hawkins- Kennedy test   Neer Impingement test    Findings Positive   Side Right   Hawkins-Kennedy test   Findings Positive   Side Right                   [facility] Adult PT Treatment/Exercise - 10/31/14 0001    Exercises   Exercises Shoulder   Shoulder Exercises: ROM/Strengthening   Other ROM/Strengthening Exercises standing cane IR/ER, ext; wall walks flex/abd   Modalities   Modalities Cryotherapy;Electrical Stimulation   Cryotherapy   Number Minutes Cryotherapy 15 Minutes   Cryotherapy Location Shoulder   Type of Cryotherapy Ice pack   Electrical Stimulation   Electrical Stimulation Location Rt shoulder   Electrical Stimulation Action IFC   Electrical Stimulation Parameters to tolerance   Electrical Stimulation Goals Pain                PT Education - 10/31/14 0915    Education provided Yes   Education Details HEP: AAROM wall walks flex/abd, standing cane for ER, IR, ext   Person(s) Educated Patient   Methods Explanation;Demonstration;Handout   Comprehension Verbalized understanding;Returned demonstration          PT Short Term Goals - 10/31/14 0926    PT SHORT TERM GOAL #1   Title I with initial HEP   Time 2   Period Weeks   Status New   PT SHORT TERM GOAL #2   Title decreased pain in Rt shoudler by 25% (11/21/14)   Time 3   Period Weeks   Status New           PT Long Term Goals - 10/31/14 0932    PT LONG TERM GOAL #1   Title I with advanced HEP   Time 6   Period Weeks   Status New   PT LONG TERM GOAL #2   Title improved right shoulder ROM to [facility] to perform ADLS   Time 6   Period Weeks   Status New   PT LONG TERM GOAL #3   Title improved right shoulder strength to 5-/5 to improve function   Time 6   Period Weeks   Status New   PT LONG TERM GOAL #4   Title patient able to sleep without waking from shoulder pain   Time 6   Period Weeks   Status New               Plan - 10/31/14 4818  Clinical Impression  Statement Patient is a 49 year old female who presents with 2 month history of right shoulder pain. She has limited ROM and strength in the right shoulder which is affecting ADLs. She also has diffictulty sleeping due to pain;. Patient would like to return to pain free ROM and increase her strenghte.    Pt will benefit from skilled therapeutic intervention in order to improve on the following deficits Decreased range of motion;Impaired UE functional use;Pain;Decreased strength   Rehab Potential Good   PT Frequency 2x / week   PT Duration 6 weeks   PT Treatment/Interventions ADLs/Self Care Home Management;Cryotherapy;Electrical Stimulation;Iontophoresis 4mg /ml Dexamethasone;Ultrasound;Therapeutic exercise;Neuromuscular re-education;Patient/family education;Manual techniques;Passive range of motion;Taping;Vasopneumatic Device   PT Next Visit Plan Review ROM exercises, ROM, strenghthening as tolerated, modalities for pain   Consulted and Agree with Plan of Care Patient         Problem List Patient Active Problem List   Diagnosis Date Noted  . Allergic reaction 03/14/2014  . Abdominal pain, right upper quadrant 03/14/2014  . Streptococcal sore throat 07/15/2013  . Leg cramps 07/15/2013  . Fever, unspecified 06/22/2012  . Diarrhea 08/14/2011  . Acquired anal stenosis 06/18/2011  . Chronic constipation 06/18/2011  . PVC's (premature ventricular contractions) 05/23/2011  . Headache, migraine, intractable 05/09/2011  . Asthma exacerbation, allergic 07/31/2010  . POSITIVE PPD 05/04/2008  . HYPERLIPIDEMIA 02/05/2008  . HYPERTENSION 02/05/2008  . PULMONARY NODULE 02/05/2008   Madelyn Flavors PT  10/31/2014, 9:38 AM  [facility] [address] [place], [place], [address] Phone: [phone]   Fax:  [phone]

## 2014-11-09 ENCOUNTER — Encounter: Payer: Self-pay | Admitting: Physical Therapy

## 2014-11-09 ENCOUNTER — Ambulatory Visit: Payer: BLUE CROSS/BLUE SHIELD | Attending: Family Medicine | Admitting: Physical Therapy

## 2014-11-09 DIAGNOSIS — M25611 Stiffness of right shoulder, not elsewhere classified: Secondary | ICD-10-CM

## 2014-11-09 DIAGNOSIS — M25511 Pain in right shoulder: Secondary | ICD-10-CM | POA: Diagnosis present

## 2014-11-09 DIAGNOSIS — R531 Weakness: Secondary | ICD-10-CM | POA: Diagnosis present

## 2014-11-09 NOTE — Therapy (Signed)
[facility] [place], [place], [address] Phone: [phone]   Fax:  [phone]  Physical Therapy Treatment  Patient Details  Name: [name] MRN: [id number] Date of Birth: 1965-05-30 Referring Provider:  Dorena Cookey, MD  Encounter Date: 11/09/2014      PT End of Session - 11/09/14 0907    Visit Number 2   Number of Visits 12   Date for PT Re-Evaluation 12/12/14   PT Start Time 0902   PT Stop Time 0951   PT Time Calculation (min) 49 min   Activity Tolerance Patient tolerated treatment well   Behavior During Therapy [facility] for tasks assessed/performed      Past Medical History  Diagnosis Date  . Hyperlipidemia   . Pulmonary nodules     not seen on plain cxr, first detected 1/07 re ct 10/08 pos IPPD > 15 mm 12/09 FOB/lavage 04/20/08 neg afb smear  . Migraine headache   . Iron deficiency anemia   . PPD positive   . Hyperthyroidism     hx 49 years old- oral meds  . Bell's palsy     with res left lag  . External hemorrhoids   . History of pneumonia   . PVC's (premature ventricular contractions)     Past Surgical History  Procedure Laterality Date  . Tubal ligation    . Endometrial ablation    . Hemorrhoid surgery      x 3  . Colonoscopy  05/04/2009    prolapsing external hemorrhoids  . Abdominal hysterectomy      There were no vitals filed for this visit.  Visit Diagnosis:  Right shoulder pain  Stiffness of right shoulder joint  Generalized weakness      Subjective Assessment - 11/09/14 0906    Subjective Reports more stiffness in the mornings. Reports feeling like RUE is going to sleep and continues to wake her during the night. Reports that wall walks into flexion and abduction continues to have pain but not as much pain as before therapy.   Patient Stated Goals to get rid of pain   Currently in Pain? Yes   Pain Score 4    Pain Location Shoulder   Pain Orientation Right   Pain  Descriptors / Indicators Other (Comment)  Stiffness   Pain Onset More than a month ago            [facility] PT Assessment - 11/09/14 0001    Assessment   Medical Diagnosis Rt RC tendinitis   Hand Dominance Right   Next MD Visit Following therapy                     [facility] Adult PT Treatment/Exercise - 11/09/14 0001    Shoulder Exercises: Supine   Flexion AAROM;20 reps   Shoulder Exercises: Seated   External Rotation AAROM;20 reps   Internal Rotation AAROM;20 reps   Shoulder Exercises: Standing   Extension AAROM;20 reps   Shoulder Exercises: Pulleys   Flexion 3 minutes   Shoulder Exercises: ROM/Strengthening   UBE (Upper Arm Bike) 120 RPM x5 min   "W" Arms x30 reps   Modalities   Modalities Cryotherapy   Cryotherapy   Number Minutes Cryotherapy 10 Minutes   Cryotherapy Location Shoulder   Type of Cryotherapy Ice pack   Manual Therapy   Manual Therapy Passive ROM;Myofascial release   Myofascial Release STW/TPR to R upper and mid humerus to decreased tightness and pain   Passive  ROM R anterior capsule, posterior capsule stretch 3x 30 sec; R shoulder into flex/scap/ER/IR with gentle holds at end range                  PT Short Term Goals - 11/09/14 0909    PT SHORT TERM GOAL #1   Title I with initial HEP   Time 2   Period Weeks   Status Achieved   PT SHORT TERM GOAL #2   Title decreased pain in Rt shoudler by 25% (11/21/14)   Time 3   Period Weeks   Status On-going           PT Long Term Goals - 11/09/14 0909    PT LONG TERM GOAL #1   Title I with advanced HEP   Time 6   Period Weeks   Status On-going   PT LONG TERM GOAL #2   Title improved right shoulder ROM to [facility] to perform ADLS   Time 6   Period Weeks   Status On-going   PT LONG TERM GOAL #3   Title improved right shoulder strength to 5-/5 to improve function   Time 6   Period Weeks   Status On-going   PT LONG TERM GOAL #4   Title patient able to sleep without waking from  shoulder pain   Time 6   Period Weeks   Status On-going               Plan - 11/09/14 0258    Clinical Impression Statement Patient tolerated treatment well with soreness reported with exercises. Reported tightness in R shoulder during treatment so manual capsule stretches were completed as well as STW to the R deltoids and anterior humerus. Moderate tightness noted in the R deltoids as well as into the R bicep region during manual therapy. Grade II-III joint mobilizations were completed to R shoulder to decrease pain and increase joint mobility. Achieved initial HEP goal today. Normal modaltiies response noted following removal of the modalties. Experinced "feeling good" with decreased tightness and soreness following treatment.   Pt will benefit from skilled therapeutic intervention in order to improve on the following deficits Decreased range of motion;Impaired UE functional use;Pain;Decreased strength   Rehab Potential Good   PT Frequency 2x / week   PT Duration 6 weeks   PT Treatment/Interventions ADLs/Self Care Home Management;Cryotherapy;Electrical Stimulation;Iontophoresis 4mg /ml Dexamethasone;Ultrasound;Therapeutic exercise;Neuromuscular re-education;Patient/family education;Manual techniques;Passive range of motion;Taping;Vasopneumatic Device   PT Next Visit Plan Review ROM exercises, ROM, strenghthening as tolerated, modalities for pain   Consulted and Agree with Plan of Care Patient        Problem List Patient Active Problem List   Diagnosis Date Noted  . Allergic reaction 03/14/2014  . Abdominal pain, right upper quadrant 03/14/2014  . Streptococcal sore throat 07/15/2013  . Leg cramps 07/15/2013  . Fever, unspecified 06/22/2012  . Diarrhea 08/14/2011  . Acquired anal stenosis 06/18/2011  . Chronic constipation 06/18/2011  . PVC's (premature ventricular contractions) 05/23/2011  . Headache, migraine, intractable 05/09/2011  . Asthma exacerbation, allergic  07/31/2010  . POSITIVE PPD 05/04/2008  . HYPERLIPIDEMIA 02/05/2008  . HYPERTENSION 02/05/2008  . PULMONARY NODULE 02/05/2008    Wynelle Fanny, PTA 11/09/2014, 10:04 AM  [facility] [address] [place], [place], [address] Phone: [phone]   Fax:  [phone]

## 2014-11-21 ENCOUNTER — Encounter: Payer: Self-pay | Admitting: Physical Therapy

## 2014-11-21 ENCOUNTER — Ambulatory Visit: Payer: BLUE CROSS/BLUE SHIELD | Admitting: Physical Therapy

## 2014-11-21 DIAGNOSIS — M25611 Stiffness of right shoulder, not elsewhere classified: Secondary | ICD-10-CM

## 2014-11-21 DIAGNOSIS — M25511 Pain in right shoulder: Secondary | ICD-10-CM

## 2014-11-21 DIAGNOSIS — R531 Weakness: Secondary | ICD-10-CM

## 2014-11-21 NOTE — Therapy (Signed)
[facility] [place], [place], [address] Phone: [phone]   Fax:  [phone]  Physical Therapy Treatment  Patient Details  Name: [name] MRN: [id number] Date of Birth: 05/28/1965 Referring Provider:  Dorena Cookey, MD  Encounter Date: 11/21/2014      PT End of Session - 11/21/14 0904    Visit Number 3   Number of Visits 12   Date for PT Re-Evaluation 12/12/14   PT Start Time 0901   PT Stop Time 0946   PT Time Calculation (min) 45 min   Activity Tolerance Patient tolerated treatment well   Behavior During Therapy [facility] for tasks assessed/performed      Past Medical History  Diagnosis Date  . Hyperlipidemia   . Pulmonary nodules     not seen on plain cxr, first detected 1/07 re ct 10/08 pos IPPD > 15 mm 12/09 FOB/lavage 04/20/08 neg afb smear  . Migraine headache   . Iron deficiency anemia   . PPD positive   . Hyperthyroidism     hx 49 years old- oral meds  . Bell's palsy     with res left lag  . External hemorrhoids   . History of pneumonia   . PVC's (premature ventricular contractions)     Past Surgical History  Procedure Laterality Date  . Tubal ligation    . Endometrial ablation    . Hemorrhoid surgery      x 3  . Colonoscopy  05/04/2009    prolapsing external hemorrhoids  . Abdominal hysterectomy      There were no vitals filed for this visit.  Visit Diagnosis:  Right shoulder pain  Stiffness of right shoulder joint  Generalized weakness      Subjective Assessment - 11/21/14 0903    Subjective Reports that shoulder is "feeling better and better." Continues to ache at night but not to the severity that she experienced previously. If her shoulder begins hurting that she completes exercises that have been given to her. Reports no problems with ADLs.   Patient Stated Goals to get rid of pain   Currently in Pain? Yes   Pain Score 3    Pain Location Shoulder   Pain Orientation Right    Pain Descriptors / Indicators Sore   Pain Onset More than a month ago            [facility] PT Assessment - 11/21/14 0001    Assessment   Medical Diagnosis Rt RC tendinitis   Next MD Visit Following therapy                     [facility] Adult PT Treatment/Exercise - 11/21/14 0001    Shoulder Exercises: Supine   Protraction AAROM;Right;Other (comment)  3x10 reps   Flexion AAROM;Right;Other (comment);AROM  3x10 reps AAROM, 2 x10 reps AROM   Shoulder Exercises: Seated   External Rotation AAROM;Right;Other (comment)  3x10 reps   Shoulder Exercises: Standing   Protraction Strengthening;Right;20 reps;Theraband   Theraband Level (Shoulder Protraction) Level 2 (Red)   External Rotation Strengthening;Right;20 reps;Theraband   Theraband Level (Shoulder External Rotation) Level 2 (Red)   Internal Rotation Strengthening;Right;20 reps;Theraband   Theraband Level (Shoulder Internal Rotation) Level 2 (Red)   Flexion Strengthening;Right;20 reps;Weights   Shoulder Flexion Weight (lbs) 1   Extension AAROM;Right;Other (comment);Strengthening;Theraband  3x10 reps AAROM, 2 x10 reps strengthening   Theraband Level (Shoulder Extension) Level 2 (Red)   Row Strengthening;Right;20 reps;Theraband   Theraband Level (Shoulder  Row) Level 2 (Red)   Other Standing Exercises Bent R Empty can 1# 3 x10 reps   Shoulder Exercises: Pulleys   Flexion 3 minutes   Shoulder Exercises: ROM/Strengthening   UBE (Upper Arm Bike) 120 RPM x5 min   "W" Arms x30 reps   Shoulder Exercises: Stretch   Other Shoulder Stretches R sleeper stretch 3 x30 sec   Other Shoulder Stretches R anterior capsule stretch in supine with LLDS into ER 3 x30 sec   Manual Therapy   Manual Therapy Myofascial release   Myofascial Release IASTW/STW to R anterior shoulder/ bicep tendon to decrease pain                PT Education - 11/21/14 0947    Education provided Yes   Education Details HEP- 4 way theraband  strengthening with red theraband   Person(s) Educated Patient   Methods Explanation;Demonstration;Tactile cues;Verbal cues;Handout   Comprehension Verbalized understanding;Returned demonstration;Verbal cues required;Tactile cues required          PT Short Term Goals - 11/21/14 0916    PT SHORT TERM GOAL #1   Title I with initial HEP   Time 2   Period Weeks   Status Achieved   PT SHORT TERM GOAL #2   Title decreased pain in Rt shoudler by 25% (11/21/14)   Time 3   Period Weeks   Status Achieved           PT Long Term Goals - 11/09/14 0909    PT LONG TERM GOAL #1   Title I with advanced HEP   Time 6   Period Weeks   Status On-going   PT LONG TERM GOAL #2   Title improved right shoulder ROM to [facility] to perform ADLS   Time 6   Period Weeks   Status On-going   PT LONG TERM GOAL #3   Title improved right shoulder strength to 5-/5 to improve function   Time 6   Period Weeks   Status On-going   PT LONG TERM GOAL #4   Title patient able to sleep without waking from shoulder pain   Time 6   Period Weeks   Status On-going               Plan - 11/21/14 0919    Clinical Impression Statement Patient tolerated treatment well without complaint of pain. Has achieved all ST goals set at evaluation and reports decreased pain by 60% at this time. Tolerated strengthening exercises well with only reporting discomfort during ER with red band. Accepted new HEP for strengthening without questions. Minimal tightness noted in R anterior shoulder/ bicep tendon/ deltoids during manual therapy. Experienced 4-5/10 pain following treatment.   Pt will benefit from skilled therapeutic intervention in order to improve on the following deficits Decreased range of motion;Impaired UE functional use;Pain;Decreased strength   Rehab Potential Good   PT Frequency 2x / week   PT Duration 6 weeks   PT Treatment/Interventions ADLs/Self Care Home Management;Cryotherapy;Electrical  Stimulation;Iontophoresis 4mg /ml Dexamethasone;Ultrasound;Therapeutic exercise;Neuromuscular re-education;Patient/family education;Manual techniques;Passive range of motion;Taping;Vasopneumatic Device   PT Next Visit Plan Review ROM exercises, ROM, strenghthening as tolerated, modalities for pain   Consulted and Agree with Plan of Care Patient        Problem List Patient Active Problem List   Diagnosis Date Noted  . Allergic reaction 03/14/2014  . Abdominal pain, right upper quadrant 03/14/2014  . Streptococcal sore throat 07/15/2013  . Leg cramps 07/15/2013  . Fever, unspecified 06/22/2012  .  Diarrhea 08/14/2011  . Acquired anal stenosis 06/18/2011  . Chronic constipation 06/18/2011  . PVC's (premature ventricular contractions) 05/23/2011  . Headache, migraine, intractable 05/09/2011  . Asthma exacerbation, allergic 07/31/2010  . POSITIVE PPD 05/04/2008  . HYPERLIPIDEMIA 02/05/2008  . HYPERTENSION 02/05/2008  . PULMONARY NODULE 02/05/2008    Wynelle Fanny, PTA 11/21/2014, 9:54 AM  [facility] [address] [place], [place], [address] Phone: [phone]   Fax:  [phone]

## 2014-11-21 NOTE — Patient Instructions (Signed)
Strengthening: Resisted External Rotation   Hold tubing in right hand, elbow at side and forearm across body. Rotate forearm out. Repeat _10___ times per set. Do _2-3___ sets per session. Do __2-3__ sessions per day.  http://orth.exer.us/828   Copyright  VHI. All rights reserved.  Strengthening: Resisted Internal Rotation   Hold tubing in left hand, elbow at side and forearm out. Rotate forearm in across body. Repeat _10___ times per set. Do __2-3__ sets per session. Do _2-3___ sessions per day.  http://orth.exer.us/830   Copyright  VHI. All rights reserved.  Strengthening: Resisted Extension   Hold tubing in right hand, arm forward. Pull arm back, elbow straight. Repeat __10__ times per set. Do __2-3__ sets per session. Do _2-3___ sessions per day.  http://orth.exer.us/832   Copyright  VHI. All rights reserved.  Scapular Retraction: Bilateral   Facing anchor, pull arms back, bringing shoulder blades together. Repeat __10__ times per set. Do _2-3___ sets per session. Do _2-3___ sessions per day.  http://orth.exer.us/176   Copyright  VHI. All rights reserved.

## 2014-11-28 ENCOUNTER — Ambulatory Visit: Payer: BLUE CROSS/BLUE SHIELD | Admitting: Physical Therapy

## 2014-11-28 ENCOUNTER — Encounter: Payer: Self-pay | Admitting: Family Medicine

## 2014-11-28 ENCOUNTER — Ambulatory Visit (INDEPENDENT_AMBULATORY_CARE_PROVIDER_SITE_OTHER): Payer: BLUE CROSS/BLUE SHIELD | Admitting: Family Medicine

## 2014-11-28 VITALS — BP 110/86 | Temp 98.4°F | Ht 67.0 in | Wt 176.4 lb

## 2014-11-28 DIAGNOSIS — I10 Essential (primary) hypertension: Secondary | ICD-10-CM | POA: Diagnosis not present

## 2014-11-28 DIAGNOSIS — R252 Cramp and spasm: Secondary | ICD-10-CM

## 2014-11-28 DIAGNOSIS — R200 Anesthesia of skin: Secondary | ICD-10-CM | POA: Diagnosis not present

## 2014-11-28 LAB — CBC WITH DIFFERENTIAL/PLATELET
Basophils Absolute: 0 10*3/uL (ref 0.0–0.1)
Basophils Relative: 0.4 % (ref 0.0–3.0)
EOS ABS: 0.2 10*3/uL (ref 0.0–0.7)
Eosinophils Relative: 3.2 % (ref 0.0–5.0)
HCT: 36.7 % (ref 36.0–46.0)
Hemoglobin: 12.2 g/dL (ref 12.0–15.0)
LYMPHS ABS: 2.5 10*3/uL (ref 0.7–4.0)
Lymphocytes Relative: 43.8 % (ref 12.0–46.0)
MCHC: 33.3 g/dL (ref 30.0–36.0)
MCV: 89.2 fl (ref 78.0–100.0)
MONOS PCT: 6.5 % (ref 3.0–12.0)
Monocytes Absolute: 0.4 10*3/uL (ref 0.1–1.0)
Neutro Abs: 2.7 10*3/uL (ref 1.4–7.7)
Neutrophils Relative %: 46.1 % (ref 43.0–77.0)
Platelets: 254 10*3/uL (ref 150.0–400.0)
RBC: 4.12 Mil/uL (ref 3.87–5.11)
RDW: 14.2 % (ref 11.5–15.5)
WBC: 5.8 10*3/uL (ref 4.0–10.5)

## 2014-11-28 LAB — TSH: TSH: 4.92 u[IU]/mL — ABNORMAL HIGH (ref 0.35–4.50)

## 2014-11-28 LAB — VITAMIN B12: VITAMIN B 12: 278 pg/mL (ref 211–911)

## 2014-11-28 LAB — BASIC METABOLIC PANEL
BUN: 12 mg/dL (ref 6–23)
CHLORIDE: 107 meq/L (ref 96–112)
CO2: 29 meq/L (ref 19–32)
Calcium: 9.3 mg/dL (ref 8.4–10.5)
Creatinine, Ser: 1.08 mg/dL (ref 0.40–1.20)
GFR: 69.9 mL/min (ref >60.00)
Glucose, Bld: 84 mg/dL (ref 70–99)
POTASSIUM: 3.5 meq/L (ref 3.5–5.1)
Sodium: 142 mEq/L (ref 135–145)

## 2014-11-28 NOTE — Progress Notes (Signed)
Pre visit review using our clinic review tool, if applicable. No additional management support is needed unless otherwise documented below in the visit note. 

## 2014-11-28 NOTE — Progress Notes (Signed)
   Subjective:    Patient ID: [name], female    DOB: November 07, 1965, 49 y.o.   MRN: [id number]  HPI Here for intermittent muscle cramps, numbness and tingling in the hands, feet, and legs. No swelling. Her BP has been stable. She had a potassium of 3.1 last year but she declined treating this.    Review of Systems  Constitutional: Negative.   Respiratory: Negative.   Cardiovascular: Negative.   Neurological: Positive for numbness.       Objective:   Physical Exam  Constitutional: She is oriented to person, place, and time. She appears well-developed and well-nourished.  Neck: No thyromegaly present.  Cardiovascular: Normal rate, regular rhythm, normal heart sounds and intact distal pulses.   Pulmonary/Chest: Effort normal and breath sounds normal.  Musculoskeletal: She exhibits no edema.  Lymphadenopathy:    She has no cervical adenopathy.  Neurological: She is alert and oriented to person, place, and time. No cranial nerve deficit.          Assessment & Plan:  She likely has a low potassium again, but we will get labs to check for several possible etiologies. Her BP is stable.

## 2014-11-30 ENCOUNTER — Telehealth: Payer: Self-pay | Admitting: Family Medicine

## 2014-11-30 MED ORDER — LOSARTAN POTASSIUM-HCTZ 100-12.5 MG PO TABS: 1.0000 | ORAL_TABLET | Freq: Every day | ORAL | Status: DC

## 2014-11-30 NOTE — Telephone Encounter (Signed)
Pt is aware rx sent. Pt is still waiting on her test resullts

## 2014-11-30 NOTE — Telephone Encounter (Signed)
Patient saw Dr. Sarajane Jews and would like to know her test results.  Patient also needs re-fill on losartan-hydrochlorothiazide (HYZAAR) 100-12.5 MG per tablet sent to [facility] [place], [place] - [address].

## 2014-11-30 NOTE — Telephone Encounter (Signed)
Rx sent 

## 2014-11-30 NOTE — Telephone Encounter (Signed)
Error/njr °

## 2014-12-01 ENCOUNTER — Encounter: Payer: Self-pay | Admitting: Physical Therapy

## 2014-12-01 ENCOUNTER — Ambulatory Visit: Payer: BLUE CROSS/BLUE SHIELD | Admitting: Physical Therapy

## 2014-12-01 DIAGNOSIS — M25511 Pain in right shoulder: Secondary | ICD-10-CM

## 2014-12-01 DIAGNOSIS — R531 Weakness: Secondary | ICD-10-CM

## 2014-12-01 DIAGNOSIS — M25611 Stiffness of right shoulder, not elsewhere classified: Secondary | ICD-10-CM

## 2014-12-01 MED ORDER — LEVOTHYROXINE SODIUM 75 MCG PO TABS: 75.0000 ug | ORAL_TABLET | Freq: Every day | ORAL | Status: DC

## 2014-12-01 NOTE — Therapy (Signed)
[facility] [place], [place], [address] Phone: [phone]   Fax:  [phone]  Physical Therapy Treatment  Patient Details  Name: [name] MRN: [id number] Date of Birth: 28-Jul-1965 Referring Provider:  Dorena Cookey, MD  Encounter Date: 12/01/2014      PT End of Session - 12/01/14 0859    Visit Number 4   Number of Visits 12   Date for PT Re-Evaluation 12/12/14   PT Start Time 0821   PT Stop Time 0859   PT Time Calculation (min) 38 min   Activity Tolerance Patient tolerated treatment well   Behavior During Therapy [facility] for tasks assessed/performed      Past Medical History  Diagnosis Date  . Hyperlipidemia   . Pulmonary nodules     not seen on plain cxr, first detected 1/07 re ct 10/08 pos IPPD > 15 mm 12/09 FOB/lavage 04/20/08 neg afb smear  . Migraine headache   . Iron deficiency anemia   . PPD positive   . Hyperthyroidism     hx 49 years old- oral meds  . Bell's palsy     with res left lag  . External hemorrhoids   . History of pneumonia   . PVC's (premature ventricular contractions)     Past Surgical History  Procedure Laterality Date  . Tubal ligation    . Endometrial ablation    . Hemorrhoid surgery      x 3  . Colonoscopy  05/04/2009    prolapsing external hemorrhoids  . Abdominal hysterectomy      There were no vitals filed for this visit.  Visit Diagnosis:  Right shoulder pain  Stiffness of right shoulder joint  Generalized weakness      Subjective Assessment - 12/01/14 0830    Subjective Sore in shoulder today   Patient Stated Goals to get rid of pain   Currently in Pain? Yes   Pain Score 3    Pain Location Shoulder   Pain Orientation Right   Pain Descriptors / Indicators Sore   Pain Type Acute pain   Pain Onset More than a month ago   Pain Frequency Constant   Aggravating Factors  at night when sleeping   Pain Relieving Factors meds                          [facility] Adult PT Treatment/Exercise - 12/01/14 0001    Shoulder Exercises: Standing   Flexion Strengthening;Right  2# 3x10   Other Standing Exercises scaption 2# 2x10   Other Standing Exercises RW4 with yellow t-band 3x10 each   Shoulder Exercises: ROM/Strengthening   UBE (Upper Arm Bike) 120 RPM x6 min   Modalities   Modalities Ultrasound   Ultrasound   Ultrasound Location Right inf shoulder   Ultrasound Parameters 1.5w/cm2/50%/63mhzx8min   Ultrasound Goals Pain                  PT Short Term Goals - 11/21/14 0916    PT SHORT TERM GOAL #1   Title I with initial HEP   Time 2   Period Weeks   Status Achieved   PT SHORT TERM GOAL #2   Title decreased pain in Rt shoudler by 25% (11/21/14)   Time 3   Period Weeks   Status Achieved           PT Long Term Goals - 11/09/14 0909    PT LONG TERM GOAL #  1   Title I with advanced HEP   Time 6   Period Weeks   Status On-going   PT LONG TERM GOAL #2   Title improved right shoulder ROM to [facility] to perform ADLS   Time 6   Period Weeks   Status On-going   PT LONG TERM GOAL #3   Title improved right shoulder strength to 5-/5 to improve function   Time 6   Period Weeks   Status On-going   PT LONG TERM GOAL #4   Title patient able to sleep without waking from shoulder pain   Time 6   Period Weeks   Status On-going               Plan - 12/01/14 1470    Clinical Impression Statement Patient tolerated treatment well although had some soreness with exercises. Started [place] to shoulder to help reduce pain and soreness. Patient has been unable to perform exercises due to traveling for work. Goals ongoing due to pain and strength deficits.   Pt will benefit from skilled therapeutic intervention in order to improve on the following deficits Decreased range of motion;Impaired UE functional use;Pain;Decreased strength   Rehab Potential Good   PT Frequency 2x / week   PT Duration 6  weeks   PT Treatment/Interventions ADLs/Self Care Home Management;Cryotherapy;Electrical Stimulation;Iontophoresis 4mg /ml Dexamethasone;Ultrasound;Therapeutic exercise;Neuromuscular re-education;Patient/family education;Manual techniques;Passive range of motion;Taping;Vasopneumatic Device   PT Next Visit Plan Cont with POC for strength, shoulder stabilization exercises and US,start ionto next treatment per MPT   Consulted and Agree with Plan of Care Patient        Problem List Patient Active Problem List   Diagnosis Date Noted  . Allergic reaction 03/14/2014  . Abdominal pain, right upper quadrant 03/14/2014  . Streptococcal sore throat 07/15/2013  . Leg cramps 07/15/2013  . Fever, unspecified 06/22/2012  . Diarrhea 08/14/2011  . Acquired anal stenosis 06/18/2011  . Chronic constipation 06/18/2011  . PVC's (premature ventricular contractions) 05/23/2011  . Headache, migraine, intractable 05/09/2011  . Asthma exacerbation, allergic 07/31/2010  . POSITIVE PPD 05/04/2008  . HYPERLIPIDEMIA 02/05/2008  . Essential hypertension 02/05/2008  . PULMONARY NODULE 02/05/2008    Antinette Keough P, PTA 12/01/2014, 9:12 AM  [facility] [address] [place], [place], [address] Phone: [phone]   Fax:  [phone]

## 2014-12-01 NOTE — Addendum Note (Signed)
Addended by: Aggie Hacker A on: 12/01/2014 12:07 PM   Modules accepted: Orders

## 2014-12-06 ENCOUNTER — Ambulatory Visit: Payer: BLUE CROSS/BLUE SHIELD | Attending: Family Medicine | Admitting: Physical Therapy

## 2014-12-06 ENCOUNTER — Encounter: Payer: Self-pay | Admitting: Physical Therapy

## 2014-12-06 DIAGNOSIS — M25511 Pain in right shoulder: Secondary | ICD-10-CM

## 2014-12-06 DIAGNOSIS — R531 Weakness: Secondary | ICD-10-CM | POA: Diagnosis present

## 2014-12-06 DIAGNOSIS — M25611 Stiffness of right shoulder, not elsewhere classified: Secondary | ICD-10-CM | POA: Insufficient documentation

## 2014-12-06 NOTE — Therapy (Signed)
[facility] [place], [place], [address] Phone: [phone]   Fax:  [phone]  Physical Therapy Treatment  Patient Details  Name: [name] MRN: [id number] Date of Birth: 1965-06-14 Referring Provider:  Dorena Cookey, MD  Encounter Date: 12/06/2014      PT End of Session - 12/06/14 0935    Visit Number 5   Number of Visits 12   Date for PT Re-Evaluation 12/12/14   PT Start Time 0902   PT Stop Time 0943   PT Time Calculation (min) 41 min   Activity Tolerance Patient tolerated treatment well   Behavior During Therapy [facility] for tasks assessed/performed      Past Medical History  Diagnosis Date  . Hyperlipidemia   . Pulmonary nodules     not seen on plain cxr, first detected 1/07 re ct 10/08 pos IPPD > 15 mm 12/09 FOB/lavage 04/20/08 neg afb smear  . Migraine headache   . Iron deficiency anemia   . PPD positive   . Hyperthyroidism     hx 49 years old- oral meds  . Bell's palsy     with res left lag  . External hemorrhoids   . History of pneumonia   . PVC's (premature ventricular contractions)     Past Surgical History  Procedure Laterality Date  . Tubal ligation    . Endometrial ablation    . Hemorrhoid surgery      x 3  . Colonoscopy  05/04/2009    prolapsing external hemorrhoids  . Abdominal hysterectomy      There were no vitals filed for this visit.  Visit Diagnosis:  Right shoulder pain  Stiffness of right shoulder joint  Generalized weakness      Subjective Assessment - 12/06/14 0906    Subjective Both shoulders are feeling symptoms today and patient is going to call Dr. Elease Hashimoto today regarding symptoms. Pain is not as bad as difficulty with use.   Patient Stated Goals to get rid of pain   Currently in Pain? Yes   Pain Score 5    Pain Location Shoulder   Pain Orientation Right   Pain Descriptors / Indicators Sore   Pain Type Acute pain   Pain Onset More than a month ago   Pain Frequency Constant   Aggravating Factors  any use of shoulder   Pain Relieving Factors rest                         [facility] Adult PT Treatment/Exercise - 12/06/14 0001    Shoulder Exercises: Standing   Other Standing Exercises RW4 with yellow t-band 3x10 each   Shoulder Exercises: ROM/Strengthening   UBE (Upper Arm Bike) 120 RPM x6 min   Modalities   Modalities Iontophoresis   Ultrasound   Ultrasound Location Right infirior shoulder   Ultrasound Parameters 1.5w/cm2/50%/3.70mhz x59min   Iontophoresis   Type of Iontophoresis Dexamethasone   Location right inf shoulder   Dose 38mL @ 4mg /mL 1 of 6   Time 8                  PT Short Term Goals - 11/21/14 8144    PT SHORT TERM GOAL #1   Title I with initial HEP   Time 2   Period Weeks   Status Achieved   PT SHORT TERM GOAL #2   Title decreased pain in Rt shoudler by 25% (11/21/14)   Time 3  Period Weeks   Status Achieved           PT Long Term Goals - 11/09/14 0909    PT LONG TERM GOAL #1   Title I with advanced HEP   Time 6   Period Weeks   Status On-going   PT LONG TERM GOAL #2   Title improved right shoulder ROM to [facility] to perform ADLS   Time 6   Period Weeks   Status On-going   PT LONG TERM GOAL #3   Title improved right shoulder strength to 5-/5 to improve function   Time 6   Period Weeks   Status On-going   PT LONG TERM GOAL #4   Title patient able to sleep without waking from shoulder pain   Time 6   Period Weeks   Status On-going               Plan - 12/06/14 0034    Clinical Impression Statement Patient arrived with increased symptoms today. Eliminated some ther ex today and added ionto patch. Patient had no increased pain with treatment. Goals ongoing due to pain, symptoms and strength deficits.   Pt will benefit from skilled therapeutic intervention in order to improve on the following deficits Decreased range of motion;Impaired UE functional use;Pain;Decreased  strength   Rehab Potential Good   Clinical Impairments Affecting Rehab Potential [place]onto 1 of 6 12/06/14   PT Frequency 2x / week   PT Duration 6 weeks   PT Treatment/Interventions ADLs/Self Care Home Management;Cryotherapy;Electrical Stimulation;Iontophoresis 4mg /ml Dexamethasone;Ultrasound;Therapeutic exercise;Neuromuscular re-education;Patient/family education;Manual techniques;Passive range of motion;Taping;Vasopneumatic Device   PT Next Visit Plan cont with POC   Consulted and Agree with Plan of Care Patient        Problem List Patient Active Problem List   Diagnosis Date Noted  . Allergic reaction 03/14/2014  . Abdominal pain, right upper quadrant 03/14/2014  . Streptococcal sore throat 07/15/2013  . Leg cramps 07/15/2013  . Fever, unspecified 06/22/2012  . Diarrhea 08/14/2011  . Acquired anal stenosis 06/18/2011  . Chronic constipation 06/18/2011  . PVC's (premature ventricular contractions) 05/23/2011  . Headache, migraine, intractable 05/09/2011  . Asthma exacerbation, allergic 07/31/2010  . POSITIVE PPD 05/04/2008  . HYPERLIPIDEMIA 02/05/2008  . Essential hypertension 02/05/2008  . PULMONARY NODULE 02/05/2008    Malisa Ruggiero P, PTA 12/06/2014, 9:44 AM  [facility] [address] [place], [place], [address] Phone: [phone]   Fax:  [phone]

## 2014-12-07 ENCOUNTER — Ambulatory Visit: Payer: BLUE CROSS/BLUE SHIELD | Admitting: Physical Therapy

## 2014-12-07 ENCOUNTER — Other Ambulatory Visit: Payer: Self-pay | Admitting: Family Medicine

## 2014-12-07 ENCOUNTER — Telehealth: Payer: Self-pay | Admitting: Family Medicine

## 2014-12-07 DIAGNOSIS — R7989 Other specified abnormal findings of blood chemistry: Secondary | ICD-10-CM

## 2014-12-07 DIAGNOSIS — M25511 Pain in right shoulder: Secondary | ICD-10-CM

## 2014-12-07 NOTE — Telephone Encounter (Signed)
Pt would like to have her thyroid labs redone.  Pt had no indication there was anything wrong With her thyroid. Pt is not sure these labs are accurate.

## 2014-12-07 NOTE — Telephone Encounter (Signed)
Pt informed. Okay with referral. Referral is ordered

## 2014-12-07 NOTE — Telephone Encounter (Signed)
Pt seen in June and states her shoulder pain is still there.  Not any better and moving in her neck. Would like to proceed w/ an MRI or whatever you feel like she should do next. Pt is still refusing injection, that will be last option

## 2014-12-07 NOTE — Telephone Encounter (Signed)
Recommend evaluation by Gardenia Phlegm.

## 2014-12-09 ENCOUNTER — Encounter: Payer: Self-pay | Admitting: Physical Therapy

## 2014-12-09 ENCOUNTER — Ambulatory Visit: Payer: BLUE CROSS/BLUE SHIELD | Admitting: Physical Therapy

## 2014-12-09 DIAGNOSIS — M25511 Pain in right shoulder: Secondary | ICD-10-CM | POA: Diagnosis not present

## 2014-12-09 DIAGNOSIS — R531 Weakness: Secondary | ICD-10-CM

## 2014-12-09 DIAGNOSIS — M25611 Stiffness of right shoulder, not elsewhere classified: Secondary | ICD-10-CM

## 2014-12-09 NOTE — Therapy (Signed)
[facility] [place], [place], [address] Phone: [phone]   Fax:  [phone]  Physical Therapy Treatment  Patient Details  Name: [name] MRN: [id number] Date of Birth: February 15, 1966 Referring Provider:  Dorena Cookey, MD  Encounter Date: 12/09/2014      PT End of Session - 12/09/14 0839    Visit Number 6   Number of Visits 12   Date for PT Re-Evaluation 12/12/14   PT Start Time 0828   PT Stop Time 0900   PT Time Calculation (min) 32 min   Activity Tolerance Patient tolerated treatment well   Behavior During Therapy [facility] for tasks assessed/performed      Past Medical History  Diagnosis Date  . Hyperlipidemia   . Pulmonary nodules     not seen on plain cxr, first detected 1/07 re ct 10/08 pos IPPD > 15 mm 12/09 FOB/lavage 04/20/08 neg afb smear  . Migraine headache   . Iron deficiency anemia   . PPD positive   . Hyperthyroidism     hx 49 years old- oral meds  . Bell's palsy     with res left lag  . External hemorrhoids   . History of pneumonia   . PVC's (premature ventricular contractions)     Past Surgical History  Procedure Laterality Date  . Tubal ligation    . Endometrial ablation    . Hemorrhoid surgery      x 3  . Colonoscopy  05/04/2009    prolapsing external hemorrhoids  . Abdominal hysterectomy      There were no vitals filed for this visit.  Visit Diagnosis:  Right shoulder pain  Stiffness of right shoulder joint  Generalized weakness      Subjective Assessment - 12/09/14 0831    Subjective late today to therapy, ionto patch did great per patient. Has a MD with Dr. Elease Hashimoto next week   Patient is accompained by: Family member   Patient Stated Goals to get rid of pain   Currently in Pain? Yes   Pain Score 5    Pain Location Shoulder   Pain Orientation Right   Pain Type Acute pain   Pain Onset More than a month ago   Pain Frequency Constant   Aggravating Factors   activity with shoulder   Pain Relieving Factors rest                         [facility] Adult PT Treatment/Exercise - 12/09/14 0001    Shoulder Exercises: Sidelying   External Rotation Strengthening;Right  2# 2x10   Shoulder Exercises: Standing   Other Standing Exercises scaption 2# 2x10   Shoulder Exercises: ROM/Strengthening   UBE (Upper Arm Bike) 120 RPM x6 min   Modalities   Modalities Iontophoresis   Ultrasound   Ultrasound Location right inf shoulder   Ultrasound Parameters 1.5w/cm2/50%/3.61mhz x76min   Ultrasound Goals Pain   Iontophoresis   Type of Iontophoresis Dexamethasone   Location right inf shoulder   Dose 28mL @ 4mg /mL 2 of 6   Time 8                  PT Short Term Goals - 11/21/14 7035    PT SHORT TERM GOAL #1   Title I with initial HEP   Time 2   Period Weeks   Status Achieved   PT SHORT TERM GOAL #2   Title decreased pain in Rt shoudler by  25% (11/21/14)   Time 3   Period Weeks   Status Achieved           PT Long Term Goals - 11/09/14 0909    PT LONG TERM GOAL #1   Title I with advanced HEP   Time 6   Period Weeks   Status On-going   PT LONG TERM GOAL #2   Title improved right shoulder ROM to [facility] to perform ADLS   Time 6   Period Weeks   Status On-going   PT LONG TERM GOAL #3   Title improved right shoulder strength to 5-/5 to improve function   Time 6   Period Weeks   Status On-going   PT LONG TERM GOAL #4   Title patient able to sleep without waking from shoulder pain   Time 6   Period Weeks   Status On-going               Plan - 12/09/14 0839    Clinical Impression Statement Patient progresing slowly with no pain decrease in pain, going to MD for follow up on August 17th. Patient has responded well to iono and will continue until MD follow up. Goals ongoing due to pain and strength deficits.   Pt will benefit from skilled therapeutic intervention in order to improve on the following deficits Decreased  range of motion;Impaired UE functional use;Pain;Decreased strength   Rehab Potential Good   Clinical Impairments Affecting Rehab Potential Ionto 2 of 6 12/06/14   PT Frequency 2x / week   PT Duration 6 weeks   PT Treatment/Interventions ADLs/Self Care Home Management;Cryotherapy;Electrical Stimulation;Iontophoresis 4mg /ml Dexamethasone;Ultrasound;Therapeutic exercise;Neuromuscular re-education;Patient/family education;Manual techniques;Passive range of motion;Taping;Vasopneumatic Device   PT Next Visit Plan cont with POC   Consulted and Agree with Plan of Care Patient        Problem List Patient Active Problem List   Diagnosis Date Noted  . Allergic reaction 03/14/2014  . Abdominal pain, right upper quadrant 03/14/2014  . Streptococcal sore throat 07/15/2013  . Leg cramps 07/15/2013  . Fever, unspecified 06/22/2012  . Diarrhea 08/14/2011  . Acquired anal stenosis 06/18/2011  . Chronic constipation 06/18/2011  . PVC's (premature ventricular contractions) 05/23/2011  . Headache, migraine, intractable 05/09/2011  . Asthma exacerbation, allergic 07/31/2010  . POSITIVE PPD 05/04/2008  . HYPERLIPIDEMIA 02/05/2008  . Essential hypertension 02/05/2008  . PULMONARY NODULE 02/05/2008    Dorian Duval P, PTA 12/09/2014, 9:01 AM  [facility] [place], [place], [address] Phone: [phone]   Fax:  [phone]

## 2014-12-14 ENCOUNTER — Ambulatory Visit: Payer: BLUE CROSS/BLUE SHIELD | Admitting: Physical Therapy

## 2014-12-19 ENCOUNTER — Ambulatory Visit: Payer: BLUE CROSS/BLUE SHIELD | Admitting: Physical Therapy

## 2014-12-19 DIAGNOSIS — R531 Weakness: Secondary | ICD-10-CM

## 2014-12-19 DIAGNOSIS — M25611 Stiffness of right shoulder, not elsewhere classified: Secondary | ICD-10-CM

## 2014-12-19 DIAGNOSIS — M25511 Pain in right shoulder: Secondary | ICD-10-CM | POA: Diagnosis not present

## 2014-12-19 NOTE — Therapy (Signed)
[facility] [place], [place], [address] Phone: [phone]   Fax:  [phone]  Physical Therapy Treatment  Patient Details  Name: [name] MRN: [id number] Date of Birth: 1965/10/24 Referring Provider:  Dorena Cookey, MD  Encounter Date: 12/19/2014      PT End of Session - 12/19/14 1550    Visit Number 7   Number of Visits 12   Date for PT Re-Evaluation 12/12/14   PT Start Time 0315   PT Stop Time 0356   PT Time Calculation (min) 41 min   Activity Tolerance Patient tolerated treatment well   Behavior During Therapy [facility] for tasks assessed/performed      Past Medical History  Diagnosis Date  . Hyperlipidemia   . Pulmonary nodules     not seen on plain cxr, first detected 1/07 re ct 10/08 pos IPPD > 15 mm 12/09 FOB/lavage 04/20/08 neg afb smear  . Migraine headache   . Iron deficiency anemia   . PPD positive   . Hyperthyroidism     hx 49 years old- oral meds  . Bell's palsy     with res left lag  . External hemorrhoids   . History of pneumonia   . PVC's (premature ventricular contractions)     Past Surgical History  Procedure Laterality Date  . Tubal ligation    . Endometrial ablation    . Hemorrhoid surgery      x 3  . Colonoscopy  05/04/2009    prolapsing external hemorrhoids  . Abdominal hysterectomy      There were no vitals filed for this visit.  Visit Diagnosis:  Right shoulder pain  Stiffness of right shoulder joint  Generalized weakness      Subjective Assessment - 12/19/14 1548    Subjective Going for an Ortho consult.   Pain Score 5    Pain Location Shoulder   Pain Orientation Right   Pain Descriptors / Indicators Aching                         [facility] Adult PT Treatment/Exercise - 12/19/14 0001    Electrical Stimulation   Electrical Stimulation Location Constant Pre-mod x 15 minutes.   Ultrasound   Ultrasound Location Right shoulder acromial ridge   Ultrasound Parameters Combo E'stim/U/S at 1.50 W/CM2 x 15 minutes.   Ultrasound Goals Pain   Iontophoresis   Type of Iontophoresis Dexamethasone   Location Right shoulder acromial ridge region.   Dose 2ML 4mg /ml   Time 8                  PT Short Term Goals - 11/21/14 7253    PT SHORT TERM GOAL #1   Title I with initial HEP   Time 2   Period Weeks   Status Achieved   PT SHORT TERM GOAL #2   Title decreased pain in Rt shoudler by 25% (11/21/14)   Time 3   Period Weeks   Status Achieved           PT Long Term Goals - 11/09/14 0909    PT LONG TERM GOAL #1   Title I with advanced HEP   Time 6   Period Weeks   Status On-going   PT LONG TERM GOAL #2   Title improved right shoulder ROM to [facility] to perform ADLS   Time 6   Period Weeks   Status On-going   PT LONG TERM  GOAL #3   Title improved right shoulder strength to 5-/5 to improve function   Time 6   Period Weeks   Status On-going   PT LONG TERM GOAL #4   Title patient able to sleep without waking from shoulder pain   Time 6   Period Weeks   Status On-going               Problem List Patient Active Problem List   Diagnosis Date Noted  . Allergic reaction 03/14/2014  . Abdominal pain, right upper quadrant 03/14/2014  . Streptococcal sore throat 07/15/2013  . Leg cramps 07/15/2013  . Fever, unspecified 06/22/2012  . Diarrhea 08/14/2011  . Acquired anal stenosis 06/18/2011  . Chronic constipation 06/18/2011  . PVC's (premature ventricular contractions) 05/23/2011  . Headache, migraine, intractable 05/09/2011  . Asthma exacerbation, allergic 07/31/2010  . POSITIVE PPD 05/04/2008  . HYPERLIPIDEMIA 02/05/2008  . Essential hypertension 02/05/2008  . PULMONARY NODULE 02/05/2008    Karsin Pesta, [place] MPT 12/19/2014, 4:03 PM  [facility] [address] [place], [place], [address] Phone: [phone]   Fax:  [phone]

## 2014-12-20 ENCOUNTER — Ambulatory Visit: Payer: BLUE CROSS/BLUE SHIELD | Admitting: *Deleted

## 2014-12-20 NOTE — Telephone Encounter (Signed)
I redid lab order for [place] location and spoke with pt.

## 2014-12-20 NOTE — Telephone Encounter (Signed)
Pt has appt w/ Dr Lois Huxley at [facility] tomorrow. Can pt have the thyroid lab drawn while she is at her appt?

## 2014-12-20 NOTE — Telephone Encounter (Signed)
Can you call pt to schedule lab appointment? 

## 2014-12-20 NOTE — Telephone Encounter (Signed)
I put in orders for a full thyroid panel, she will need to schedule a lab draw

## 2014-12-21 ENCOUNTER — Ambulatory Visit (INDEPENDENT_AMBULATORY_CARE_PROVIDER_SITE_OTHER)
Admission: RE | Admit: 2014-12-21 | Discharge: 2014-12-21 | Disposition: A | Discharge status: Home / Self Care | Payer: BLUE CROSS/BLUE SHIELD | Source: Ambulatory Visit | Attending: Family Medicine | Admitting: Family Medicine

## 2014-12-21 ENCOUNTER — Ambulatory Visit (INDEPENDENT_AMBULATORY_CARE_PROVIDER_SITE_OTHER): Payer: BLUE CROSS/BLUE SHIELD | Admitting: Family Medicine

## 2014-12-21 ENCOUNTER — Encounter: Payer: Self-pay | Admitting: Family Medicine

## 2014-12-21 ENCOUNTER — Other Ambulatory Visit (INDEPENDENT_AMBULATORY_CARE_PROVIDER_SITE_OTHER): Payer: BLUE CROSS/BLUE SHIELD

## 2014-12-21 VITALS — BP 116/72 | HR 93 | Ht 67.0 in | Wt 172.0 lb

## 2014-12-21 DIAGNOSIS — M75101 Unspecified rotator cuff tear or rupture of right shoulder, not specified as traumatic: Secondary | ICD-10-CM

## 2014-12-21 DIAGNOSIS — M25511 Pain in right shoulder: Secondary | ICD-10-CM

## 2014-12-21 DIAGNOSIS — M75100 Unspecified rotator cuff tear or rupture of unspecified shoulder, not specified as traumatic: Secondary | ICD-10-CM | POA: Insufficient documentation

## 2014-12-21 DIAGNOSIS — R7989 Other specified abnormal findings of blood chemistry: Secondary | ICD-10-CM

## 2014-12-21 LAB — T4, FREE: FREE T4: 0.97 ng/dL (ref 0.60–1.60)

## 2014-12-21 LAB — TSH: TSH: 1.38 u[IU]/mL (ref 0.35–4.50)

## 2014-12-21 LAB — T3, FREE: T3, Free: 3.3 pg/mL (ref 2.3–4.2)

## 2014-12-21 MED ORDER — MELOXICAM 15 MG PO TABS: 15.0000 mg | ORAL_TABLET | Freq: Every day | ORAL | Status: DC

## 2014-12-21 NOTE — Progress Notes (Signed)
Pre visit review using our clinic review tool, if applicable. No additional management support is needed unless otherwise documented below in the visit note. 

## 2014-12-21 NOTE — Progress Notes (Signed)
[name] [place], [place] [address] Phone: [phone] Subjective:    I'm seeing this patient by the request  of:  TODD,JEFFREY ALLEN, MD   CC: right shoulder pain  PIR:[id number] [name] is a 49 y.o. female coming in with complaint of patient was seen by primary care provider as well as another provider for right shoulder pain. There is a question of subacromial bursitis versus possible frozen shoulder. Patient has started on formal physical therapy. Patient states 3-4 months no improvement with PT at this time.  Not worsening.  Has tried ice, some mild pain in the neck as well.  Seems to be related worse at night can have radiation down the arm and into her hand on that side.  Aleve does help.  Severity 5/10.  No nightime awakening though.      Past Medical History  Diagnosis Date  . Hyperlipidemia   . Pulmonary nodules     not seen on plain cxr, first detected 1/07 re ct 10/08 pos IPPD > 15 mm 12/09 FOB/lavage 04/20/08 neg afb smear  . Migraine headache   . Iron deficiency anemia   . PPD positive   . Hyperthyroidism     hx 49 years old- oral meds  . Bell's palsy     with res left lag  . External hemorrhoids   . History of pneumonia   . PVC's (premature ventricular contractions)    Past Surgical History  Procedure Laterality Date  . Tubal ligation    . Endometrial ablation    . Hemorrhoid surgery      x 3  . Colonoscopy  05/04/2009    prolapsing external hemorrhoids  . Abdominal hysterectomy     Social History  Substance Use Topics  . Smoking status: Never Smoker   . Smokeless tobacco: Never Used  . Alcohol Use: No   No Known Allergies Family History  Problem Relation Age of Onset  . Heart disease Maternal Grandmother     aunts, uncles, sister  . Kidney failure Sister   . Lung cancer      aunts and uncles  . Colon cancer Maternal Aunt     aunts and uncles  . Colon cancer Maternal Uncle     Past  medical history, social, surgical and family history all reviewed in electronic medical record.   Review of Systems: No headache, visual changes, nausea, vomiting, diarrhea, constipation, dizziness, abdominal pain, skin rash, fevers, chills, night sweats, weight loss, swollen lymph nodes, body aches, joint swelling, muscle aches, chest pain, shortness of breath, mood changes.   Objective Blood pressure 116/72, pulse 93, height 5\' 7"  (1.702 m), weight 172 lb (78.019 kg), SpO2 98 %.  General: No apparent distress alert and oriented x3 mood and affect normal, dressed appropriately.  HEENT: Pupils equal, extraocular movements intact  Respiratory: Patient's speak in full sentences and does not appear short of breath  Cardiovascular: No lower extremity edema, non tender, no erythema  Skin: Warm dry intact with no signs of infection or rash on extremities or on axial skeleton.  Abdomen: Soft nontender  Neuro: Cranial nerves II through XII are intact, neurovascularly intact in all extremities with 2+ DTRs and 2+ pulses.  Lymph: No lymphadenopathy of posterior or anterior cervical chain or axillae bilaterally.  Gait normal with good balance and coordination.  MSK:  Non tender with full range of motion and good stability and symmetric strength and tone of shoulders, elbows, wrist,  hip, knee and ankles bilaterally.  Shoulder: Right Inspection reveals no abnormalities, atrophy or asymmetry. Palpation is normal with no tenderness over AC joint or bicipital groove. ROM is full in all planes passively. 4-5 strength compared to the contralateral side signs of impingement with positive Neer and Hawkin's tests, but negative empty can sign. Speeds and Yergason's tests normal. Positive O'Brien's Normal scapular function observed. No painful arc and no drop arm sign. No apprehension sign  MSK US performed of: Right This study was ordered, performed, and interpreted by Charlann Boxer D.O.  Shoulder:     Supraspinatus:  Appears normal on long and transverse views, Bursal bulge seen with shoulder abduction on impingement view. Infraspinatus:  Appears normal on long and transverse views. Significant increase in Doppler flow Subscapularis:  Appears normal on long and transverse views. Positive bursa Teres Minor:  Appears normal on long and transverse views. AC joint:  Capsule undistended, no geyser sign. Glenohumeral Joint:  Mild arthritis Glenoid Labrum:  Hypoechoic changes with some degenerative changes noted Biceps Tendon:  Appears normal on long and transverse views, no fraying of tendon, tendon located in intertubercular groove, no subluxation with shoulder internal or external rotation.  Impression: small intersubstance rotator cuff tear with mild adhesive capsulitis and questionable labral pathology     Impression and Recommendations:     This case required medical decision making of moderate complexity.

## 2014-12-21 NOTE — Assessment & Plan Note (Signed)
Patient does have a tear noted that seems to be intersubstance with no significant retraction. Mild weakness noted. Patient though does have full range of motion at this time. Patient is doing physical therapy and I'm hoping that this will be beneficial and we will try anti-inflammatory's. We discussed the possibility of an injection which patient declined. We will get x-rays to rule out any other bony abnormality. We also discussed advance imaging the patient would like to try conservative therapy for another 2-3 weeks. Otherwise we will consider an MR arthrogram because patient does have a positive labral pathology as well.

## 2014-12-21 NOTE — Patient Instructions (Addendum)
Good to see you Ice 20 minutes 2 times daily. Usually after activity and before bed.  Continue with physical therapy. Lets get xray today as well to make sure we are not missing anything. And the neck.  pennsaid pinkie amount topically 2 times daily as needed.  Meloixcam daily for 10 days then as needed stop the aleve See me again in 2-3 weeks to make sure you are better. We may need an injection or MR-arthrogram.

## 2014-12-27 ENCOUNTER — Ambulatory Visit: Payer: BLUE CROSS/BLUE SHIELD | Admitting: Physical Therapy

## 2014-12-27 ENCOUNTER — Encounter: Payer: Self-pay | Admitting: Physical Therapy

## 2014-12-27 DIAGNOSIS — M25511 Pain in right shoulder: Secondary | ICD-10-CM

## 2014-12-27 DIAGNOSIS — R531 Weakness: Secondary | ICD-10-CM

## 2014-12-27 DIAGNOSIS — M25611 Stiffness of right shoulder, not elsewhere classified: Secondary | ICD-10-CM

## 2014-12-27 NOTE — Therapy (Signed)
[facility] [place], [place], [address] Phone: [phone]   Fax:  [phone]  Physical Therapy Treatment  Patient Details  Name: [name] MRN: [id number] Date of Birth: [birth year] Referring Provider:  Dorena Cookey, MD  Encounter Date: 12/27/2014      PT End of Session - 12/27/14 1619    Visit Number 8   Number of Visits 12   PT Start Time 1601   PT Stop Time 1640   PT Time Calculation (min) 39 min   Activity Tolerance Patient tolerated treatment well   Behavior During Therapy [facility] for tasks assessed/performed      Past Medical History  Diagnosis Date  . Hyperlipidemia   . Pulmonary nodules     not seen on plain cxr, first detected 1/07 re ct 10/08 pos IPPD > 15 mm 12/09 FOB/lavage 04/20/08 neg afb smear  . Migraine headache   . Iron deficiency anemia   . PPD positive   . Hyperthyroidism     hx 49 years old- oral meds  . Bell's palsy     with res left lag  . External hemorrhoids   . History of pneumonia   . PVC's (premature ventricular contractions)     Past Surgical History  Procedure Laterality Date  . Tubal ligation    . Endometrial ablation    . Hemorrhoid surgery      x 3  . Colonoscopy  05/04/2009    prolapsing external hemorrhoids  . Abdominal hysterectomy      There were no vitals filed for this visit.  Visit Diagnosis:  Right shoulder pain  Stiffness of right shoulder joint  Generalized weakness      Subjective Assessment - 12/27/14 1625    Subjective Patient reports that Dr. Tamala Julian said she had RC tears in the "front and back" as well as arthritis and a bone spur. He gave her a medication that was an anti-inflammatory and pain med combined but is not taking secodnary to patient experienced eyelid inflammation. Was also given a creme but patient reported no effect. Reports pain is worse at night.   Patient Stated Goals to get rid of pain   Currently in Pain? Yes   Pain Score  6    Pain Location Shoulder   Pain Orientation Right   Pain Descriptors / Indicators Aching   Pain Type Acute pain   Pain Onset More than a month ago   Pain Frequency Constant            [facility] PT Assessment - 12/27/14 0001    Assessment   Medical Diagnosis Rt RC tendinitis   Next MD Visit 01/11/2015                     [facility] Adult PT Treatment/Exercise - 12/27/14 0001    Modalities   Modalities Electrical Stimulation;Ultrasound;Iontophoresis   Acupuncturist Location R shoulder    Electrical Stimulation Action IFC   Electrical Stimulation Parameters 1-10 Hz x15 min   Electrical Stimulation Goals Pain   Ultrasound   Ultrasound Location R ant/lat shoulder   Ultrasound Parameters 1.2 w/cm2, 50%, 62mhz   Ultrasound Goals Pain   Iontophoresis   Type of Iontophoresis Dexamethasone   Location R posterioinferior aspect of shoulder   Dose 2ML 4mg /ml 4 out of 6   Time 8                  PT  Short Term Goals - 11/21/14 0916    PT SHORT TERM GOAL #1   Title I with initial HEP   Time 2   Period Weeks   Status Achieved   PT SHORT TERM GOAL #2   Title decreased pain in Rt shoudler by 25% (11/21/14)   Time 3   Period Weeks   Status Achieved           PT Long Term Goals - 11/09/14 0909    PT LONG TERM GOAL #1   Title I with advanced HEP   Time 6   Period Weeks   Status On-going   PT LONG TERM GOAL #2   Title improved right shoulder ROM to [facility] to perform ADLS   Time 6   Period Weeks   Status On-going   PT LONG TERM GOAL #3   Title improved right shoulder strength to 5-/5 to improve function   Time 6   Period Weeks   Status On-going   PT LONG TERM GOAL #4   Title patient able to sleep without waking from shoulder pain   Time 6   Period Weeks   Status On-going               Plan - 12/27/14 1632    Clinical Impression Statement Patient tolerated conservative treatment well today with no complaint of  pain verbalized. Goals remain on-going secondary to pain and strength deficits at this time. Normal modalities response noted following removal of the modalities. Able to tolerate 8 output on electrical stimulation today. Ionto patch placed over the inferioposterior aspect of R shoulder. Experienced 4-5/10 pain following treatment.   Pt will benefit from skilled therapeutic intervention in order to improve on the following deficits Decreased range of motion;Impaired UE functional use;Pain;Decreased strength   Clinical Impairments Affecting Rehab Potential Ionto 4 of 6 12/27/14   PT Frequency 2x / week   PT Duration 6 weeks   PT Treatment/Interventions ADLs/Self Care Home Management;Cryotherapy;Electrical Stimulation;Iontophoresis 4mg /ml Dexamethasone;Ultrasound;Therapeutic exercise;Neuromuscular re-education;Patient/family education;Manual techniques;Passive range of motion;Taping;Vasopneumatic Device   PT Next Visit Plan cont with POC   Consulted and Agree with Plan of Care Patient        Problem List Patient Active Problem List   Diagnosis Date Noted  . Rotator cuff tear 12/21/2014  . Allergic reaction 03/14/2014  . Abdominal pain, right upper quadrant 03/14/2014  . Streptococcal sore throat 07/15/2013  . Leg cramps 07/15/2013  . Fever, unspecified 06/22/2012  . Diarrhea 08/14/2011  . Acquired anal stenosis 06/18/2011  . Chronic constipation 06/18/2011  . PVC's (premature ventricular contractions) 05/23/2011  . Headache, migraine, intractable 05/09/2011  . Asthma exacerbation, allergic 07/31/2010  . POSITIVE PPD 05/04/2008  . HYPERLIPIDEMIA 02/05/2008  . Essential hypertension 02/05/2008  . PULMONARY NODULE 02/05/2008    Wynelle Fanny, PTA 12/27/2014, 4:47 PM  [facility] [address] [place], [place], [address] Phone: [phone]   Fax:  [phone]

## 2014-12-30 ENCOUNTER — Other Ambulatory Visit: Payer: Self-pay

## 2014-12-30 DIAGNOSIS — Z1231 Encounter for screening mammogram for malignant neoplasm of breast: Secondary | ICD-10-CM

## 2015-01-03 ENCOUNTER — Ambulatory Visit
Admission: RE | Admit: 2015-01-03 | Discharge: 2015-01-03 | Disposition: A | Discharge status: Home / Self Care | Payer: BLUE CROSS/BLUE SHIELD | Source: Ambulatory Visit

## 2015-01-03 DIAGNOSIS — Z1231 Encounter for screening mammogram for malignant neoplasm of breast: Secondary | ICD-10-CM

## 2015-01-06 ENCOUNTER — Other Ambulatory Visit: Payer: Self-pay | Admitting: Family Medicine

## 2015-01-06 ENCOUNTER — Telehealth: Payer: Self-pay | Admitting: Family Medicine

## 2015-01-06 DIAGNOSIS — R7989 Other specified abnormal findings of blood chemistry: Secondary | ICD-10-CM

## 2015-01-06 NOTE — Telephone Encounter (Signed)
Pt said she was told by Dr Sarajane Jews she need to have her thyroid retested and has an appt over there on 01/11/16 and would like to have that lab drawn then . Can you please put a order in for her to have this done at the [place] office .

## 2015-01-06 NOTE — Telephone Encounter (Signed)
Per Dr. Sarajane Jews okay to order a TSH diagnosis code abnormal TSH.

## 2015-01-06 NOTE — Telephone Encounter (Signed)
I put in future lab order for [place] location and left a voice message with this information for pt.

## 2015-01-10 ENCOUNTER — Ambulatory Visit: Payer: BLUE CROSS/BLUE SHIELD | Attending: Family Medicine | Admitting: *Deleted

## 2015-01-10 ENCOUNTER — Encounter: Payer: Self-pay | Admitting: *Deleted

## 2015-01-10 DIAGNOSIS — R531 Weakness: Secondary | ICD-10-CM | POA: Diagnosis present

## 2015-01-10 DIAGNOSIS — M25511 Pain in right shoulder: Secondary | ICD-10-CM

## 2015-01-10 DIAGNOSIS — M25611 Stiffness of right shoulder, not elsewhere classified: Secondary | ICD-10-CM | POA: Diagnosis present

## 2015-01-10 NOTE — Therapy (Addendum)
[facility] [place], [place], [address] Phone: [phone]   Fax:  [phone]  Physical Therapy Treatment  Patient Details  Name: [name] MRN: [id number] Date of Birth: 12/11/1965 Referring Provider:  Dorena Cookey, MD  Encounter Date: 01/10/2015    Past Medical History:  Diagnosis Date  . Bell's palsy    with res left lag  . Essential hypertension   . External hemorrhoids   . History of pneumonia   . Hyperlipidemia   . Hyperthyroidism    hx 49 years old- oral meds  . Iron deficiency anemia   . Migraine headache   . PPD positive   . Pulmonary nodules    not seen on plain cxr, first detected 1/07 re ct 10/08 pos IPPD > 15 mm 12/09 FOB/lavage 04/20/08 neg afb smear  . PVC's (premature ventricular contractions)     Past Surgical History:  Procedure Laterality Date  . ABDOMINAL HYSTERECTOMY    . CHOLECYSTECTOMY  2014  . COLONOSCOPY  05/04/2009   prolapsing external hemorrhoids  . ENDOMETRIAL ABLATION    . HEMORRHOID SURGERY     x 3  . TUBAL LIGATION      There were no vitals filed for this visit.  Visit Diagnosis:  Right shoulder pain  Stiffness of right shoulder joint  Generalized weakness                                 PT Short Term Goals - 11/21/14 0916      PT SHORT TERM GOAL #1   Title I with initial HEP   Time 2   Period Weeks   Status Achieved     PT SHORT TERM GOAL #2   Title decreased pain in Rt shoudler by 25% (11/21/14)   Time 3   Period Weeks   Status Achieved           PT Long Term Goals - 01/10/15 1630      PT LONG TERM GOAL #1   Period --               Problem List Patient Active Problem List   Diagnosis Date Noted  . Snoring 10/09/2015  . Hypothyroid 06/26/2015  . 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 Rotator cuff tear 12/21/2014  . Abdominal pain, right upper quadrant 03/14/2014  . Leg cramps 07/15/2013  . Acquired  anal stenosis 06/18/2011  . Chronic constipation 06/18/2011  . PVC's (premature ventricular contractions) 05/23/2011  . Headache, migraine, intractable 05/09/2011  . Asthma exacerbation, allergic 07/31/2010  . POSITIVE PPD 05/04/2008  . HYPERLIPIDEMIA 02/05/2008  . Essential hypertension 02/05/2008  . PULMONARY NODULE 02/05/2008    APPLEGATE, [place], PTA 03/11/2016, 2:19 PM  [facility] [address] [place], [place], [address] Phone: [phone]   Fax:  [phone]  PHYSICAL THERAPY DISCHARGE SUMMARY  Visits from Start of Care: 9.  Current functional level related to goals / functional outcomes: Please see above.   Remaining deficits: Goals met.   Education / Equipment: HEP. Plan: Patient agrees to discharge.  Patient goals were met. Patient is being discharged due to meeting the stated rehab goals.  ?????         Mali Applegate MPT

## 2015-01-11 ENCOUNTER — Ambulatory Visit: Payer: BLUE CROSS/BLUE SHIELD | Admitting: Family Medicine

## 2015-01-11 ENCOUNTER — Other Ambulatory Visit (INDEPENDENT_AMBULATORY_CARE_PROVIDER_SITE_OTHER): Payer: BLUE CROSS/BLUE SHIELD

## 2015-01-11 DIAGNOSIS — R7989 Other specified abnormal findings of blood chemistry: Secondary | ICD-10-CM

## 2015-01-11 LAB — TSH: TSH: 4.07 u[IU]/mL (ref 0.35–4.50)

## 2015-01-13 ENCOUNTER — Telehealth: Payer: Self-pay | Admitting: Family Medicine

## 2015-01-13 NOTE — Telephone Encounter (Signed)
Pt had labs done and call to ask about her THS results

## 2015-01-16 ENCOUNTER — Telehealth: Payer: Self-pay | Admitting: Family Medicine

## 2015-01-16 NOTE — Telephone Encounter (Signed)
Pt had these tsh labs w/ dr fry. Pt would like results of labs. Please call back.

## 2015-01-16 NOTE — Telephone Encounter (Signed)
Left message on machine with lab result per DPR.

## 2015-01-16 NOTE — Telephone Encounter (Signed)
Pt calling again - says someone keeps calling her but there is no message when someone called again at 3 PM. Please advise.

## 2015-02-02 ENCOUNTER — Ambulatory Visit (INDEPENDENT_AMBULATORY_CARE_PROVIDER_SITE_OTHER): Payer: BLUE CROSS/BLUE SHIELD | Admitting: Family Medicine

## 2015-02-02 ENCOUNTER — Encounter: Payer: Self-pay | Admitting: Family Medicine

## 2015-02-02 VITALS — BP 118/84 | HR 69 | Ht 67.0 in | Wt 171.0 lb

## 2015-02-02 DIAGNOSIS — M25511 Pain in right shoulder: Secondary | ICD-10-CM | POA: Diagnosis not present

## 2015-02-02 DIAGNOSIS — M75101 Unspecified rotator cuff tear or rupture of right shoulder, not specified as traumatic: Secondary | ICD-10-CM

## 2015-02-02 MED ORDER — IBUPROFEN-FAMOTIDINE 800-26.6 MG PO TABS: ORAL_TABLET | ORAL | Status: DC

## 2015-02-02 NOTE — Patient Instructions (Signed)
Good to see you Ice when you need it We will get MRI Duexis 3 times daily as needed See me again 1-2 days after MRI

## 2015-02-02 NOTE — Progress Notes (Signed)
Pre visit review using our clinic review tool, if applicable. No additional management support is needed unless otherwise documented below in the visit note. 

## 2015-02-02 NOTE — Progress Notes (Signed)
[name] [place], [place] [address] Phone: [phone] Subjective:    I'm seeing this patient by the request  of:  TODD,JEFFREY ALLEN, MD   CC: right shoulder pain  [id number] [name] is a 49 y.o. female coming in with complaint of patient was seen by primary care provider as well as another provider for right shoulder pain. There is a question of subacromial bursitis versus possible frozen shoulder and may be labral pathology. Patient has started on formal physical therapy. Patient was not making any significant improvement and continued with physical therapy. Patient continues to have some mild weakness compared to the contralateral side. Patient is still adamant that she would like an injection. Patient is concerned because this is been multiple weeks without any significant improvement. States that it does affects some of her daily activities such as even dressing can be difficult.     Past Medical History  Diagnosis Date  . Hyperlipidemia   . Pulmonary nodules     not seen on plain cxr, first detected 1/07 re ct 10/08 pos IPPD > 15 mm 12/09 FOB/lavage 04/20/08 neg afb smear  . Migraine headache   . Iron deficiency anemia   . PPD positive   . Hyperthyroidism     hx 49 years old- oral meds  . Bell's palsy     with res left lag  . External hemorrhoids   . History of pneumonia   . PVC's (premature ventricular contractions)    Past Surgical History  Procedure Laterality Date  . Tubal ligation    . Endometrial ablation    . Hemorrhoid surgery      x 3  . Colonoscopy  05/04/2009    prolapsing external hemorrhoids  . Abdominal hysterectomy     Social History  Substance Use Topics  . Smoking status: Never Smoker   . Smokeless tobacco: Never Used  . Alcohol Use: No   No Known Allergies Family History  Problem Relation Age of Onset  . Heart disease Maternal Grandmother     aunts, uncles, sister  . Kidney failure  Sister   . Lung cancer      aunts and uncles  . Colon cancer Maternal Aunt     aunts and uncles  . Colon cancer Maternal Uncle     Past medical history, social, surgical and family history all reviewed in electronic medical record.   Review of Systems: No headache, visual changes, nausea, vomiting, diarrhea, constipation, dizziness, abdominal pain, skin rash, fevers, chills, night sweats, weight loss, swollen lymph nodes, body aches, joint swelling, muscle aches, chest pain, shortness of breath, mood changes.   Objective Blood pressure 118/84, pulse 69, height 5\' 7"  (1.702 m), weight 171 lb (77.565 kg), SpO2 97 %.  General: No apparent distress alert and oriented x3 mood and affect normal, dressed appropriately.  HEENT: Pupils equal, extraocular movements intact  Respiratory: Patient's speak in full sentences and does not appear short of breath  Cardiovascular: No lower extremity edema, non tender, no erythema  Skin: Warm dry intact with no signs of infection or rash on extremities or on axial skeleton.  Abdomen: Soft nontender  Neuro: Cranial nerves II through XII are intact, neurovascularly intact in all extremities with 2+ DTRs and 2+ pulses.  Lymph: No lymphadenopathy of posterior or anterior cervical chain or axillae bilaterally.  Gait normal with good balance and coordination.  MSK:  Non tender with full range of motion and good stability  and symmetric strength and tone of shoulders, elbows, wrist, hip, knee and ankles bilaterally.  Shoulder: Right Inspection reveals no abnormalities, atrophy or asymmetry. Palpation is normal with no tenderness over AC joint or bicipital groove. ROM is full in all planes passively. 4-5 strength compared to the contralateral side with no improvement from previous exam signs of impingement with positive Neer and Hawkin's tests, but negative empty can sign. Speeds and Yergason's tests normal. Positive O'Brien's and worsening Normal scapular  function observed. No painful arc and no drop arm sign. No apprehension sign       Impression and Recommendations:     This case required medical decision making of moderate complexity.

## 2015-02-02 NOTE — Assessment & Plan Note (Signed)
Patient does not seem to be responding to conservative therapy. We did discuss once again about a possible injection in the shoulder which patient declined again. Patient would rather have an MR arthrogram to further evaluate for rotator cuff tear and labrum tear. Patient will have this done the near future and come back 1-2 days afterwards to discuss. We discussed icing regimen. Patient will avoid certain activities and to really no better. Patient will stop with formal physical therapy until imaging.  Spent  25 minutes with patient face-to-face and had greater than 50% of counseling including as described above in assessment and plan.

## 2015-02-20 ENCOUNTER — Ambulatory Visit
Admission: RE | Admit: 2015-02-20 | Discharge: 2015-02-20 | Disposition: A | Discharge status: Home / Self Care | Payer: BLUE CROSS/BLUE SHIELD | Source: Ambulatory Visit | Attending: Family Medicine | Admitting: Family Medicine

## 2015-02-20 DIAGNOSIS — M25511 Pain in right shoulder: Secondary | ICD-10-CM

## 2015-02-20 MED ORDER — IOHEXOL 180 MG/ML  SOLN
15.0000 mL | Freq: Once | INTRAMUSCULAR | Status: DC | PRN
  Administered 2015-02-20: 15 mL via INTRA_ARTICULAR

## 2015-02-21 ENCOUNTER — Telehealth: Payer: Self-pay | Admitting: Family Medicine

## 2015-02-21 NOTE — Telephone Encounter (Signed)
Pt request result of the MRI that was done 02/20/15. Please call pt back

## 2015-02-21 NOTE — Telephone Encounter (Signed)
Discussed with pt, scheduled appt for shoulder injection.

## 2015-03-03 ENCOUNTER — Ambulatory Visit: Payer: BLUE CROSS/BLUE SHIELD | Admitting: Family Medicine

## 2015-03-08 ENCOUNTER — Ambulatory Visit (INDEPENDENT_AMBULATORY_CARE_PROVIDER_SITE_OTHER): Payer: BLUE CROSS/BLUE SHIELD | Admitting: Family Medicine

## 2015-03-08 ENCOUNTER — Encounter: Payer: Self-pay | Admitting: Family Medicine

## 2015-03-08 VITALS — BP 130/84 | HR 66 | Ht 67.0 in | Wt 172.0 lb

## 2015-03-08 DIAGNOSIS — M75101 Unspecified rotator cuff tear or rupture of right shoulder, not specified as traumatic: Secondary | ICD-10-CM | POA: Diagnosis not present

## 2015-03-08 DIAGNOSIS — M5412 Radiculopathy, cervical region: Secondary | ICD-10-CM | POA: Diagnosis not present

## 2015-03-08 DIAGNOSIS — E039 Hypothyroidism, unspecified: Secondary | ICD-10-CM | POA: Diagnosis not present

## 2015-03-08 MED ORDER — GABAPENTIN 100 MG PO CAPS: 200.0000 mg | ORAL_CAPSULE | Freq: Every day | ORAL | Status: DC

## 2015-03-08 NOTE — Assessment & Plan Note (Signed)
Patient is a small tear of the rotator cuff but I do not think that this is contributing to the pain that she has today. No significant impingement signs noted. Patient has full range of motion of the shoulder itself as well as good strength. Patient does have more pain on the anterior chest wall and I think that the differential includes more of a myalgias secondary to her thyroid disease versus the possibility of a brachial neuritis. We discussed different treatment options as well as different tests such as an EMG. Patient has elected to try treatment for both. Discussed with patient to start her Synthroid again on a regular basis, the patient will also be treated for the possibility of a brachial neuritis. Patient will start gabapentin. We discussed the prognosis for both of these. We will see if patient response to either of the medications. Patient come back and see me again in 3 weeks for further evaluation and treatment. Patient continues to have symptoms we'll either though up on the gabapentin or consider possibly Effexor.  Spent  25 minutes with patient face-to-face and had greater than 50% of counseling including as described above in assessment and plan.

## 2015-03-08 NOTE — Progress Notes (Signed)
[name] [place] [place], [place] [address] Phone: [phone] Subjective:     CC: right shoulder pain follow-up  [id number] [name] is a 49 y.o. female coming in with complaint of patient was seen by primary care provider as well as another provider for right shoulder pain.   patient did have more of a subacromial bursitis as well as some mild intersubstance tearing of the rotator cuff. There is concern for possible labral tear with her not improving previously. Patient was sent for an MRI. Patient's MRI did correspond well with the ultrasound showing only a small articular side tear of the rotator cuff but no labral pathology. Patient was to continue home exercises and is here for further follow-up. Patient states also pain seems to be a more on the anterior shoulder. States that laying on that side seems to make it worse. Continues to have some radicular symptoms. Patient has noticed that when she has stopped her thyroid medications recently she has noticed that this is Significantly worse. Patient does not think there hasn't been any neck pain.    Past Medical History  Diagnosis Date  . Hyperlipidemia   . Pulmonary nodules     not seen on plain cxr, first detected 1/07 re ct 10/08 pos IPPD > 15 mm 12/09 FOB/lavage 04/20/08 neg afb smear  . Migraine headache   . Iron deficiency anemia   . PPD positive   . Hyperthyroidism     hx 49 years old- oral meds  . Bell's palsy     with res left lag  . External hemorrhoids   . History of pneumonia   . PVC's (premature ventricular contractions)    Past Surgical History  Procedure Laterality Date  . Tubal ligation    . Endometrial ablation    . Hemorrhoid surgery      x 3  . Colonoscopy  05/04/2009    prolapsing external hemorrhoids  . Abdominal hysterectomy     Social History  Substance Use Topics  . Smoking status: Never Smoker   . Smokeless tobacco: Never Used  . Alcohol Use: No     No Known Allergies Family History  Problem Relation Age of Onset  . Heart disease Maternal Grandmother     aunts, uncles, sister  . Kidney failure Sister   . Lung cancer      aunts and uncles  . Colon cancer Maternal Aunt     aunts and uncles  . Colon cancer Maternal Uncle     Past medical history, social, surgical and family history all reviewed in electronic medical record.   Review of Systems: No headache, visual changes, nausea, vomiting, diarrhea, constipation, dizziness, abdominal pain, skin rash, fevers, chills, night sweats, weight loss, swollen lymph nodes, body aches, joint swelling, muscle aches, chest pain, shortness of breath, mood changes.   Objective Blood pressure 130/84, pulse 66, height 5\' 7"  (1.702 m), weight 172 lb (78.019 kg), SpO2 97 %.  General: No apparent distress alert and oriented x3 mood and affect normal, dressed appropriately.  HEENT: Pupils equal, extraocular movements intact  Respiratory: Patient's speak in full sentences and does not appear short of breath since last visit patient does have goiter noted Cardiovascular: No lower extremity edema, non tender, no erythema  Skin: Warm dry intact with no signs of infection or rash on extremities or on axial skeleton.  Abdomen: Soft nontender  Neuro: Cranial nerves II through XII are intact, neurovascularly intact in all  extremities with 2+ DTRs and 2+ pulses.  Lymph: No lymphadenopathy of posterior or anterior cervical chain or axillae bilaterally.  Gait normal with good balance and coordination.  MSK:  Non tender with full range of motion and good stability and symmetric strength and tone of shoulders, elbows, wrist, hip, knee and ankles bilaterally.  Shoulder: Right Inspection reveals no abnormalities, atrophy or asymmetry. Palpation is normal with no tenderness over AC joint or bicipital groove. Patient is more tender over the anterior chest wall. ROM is full in all planes passively. Full strength  compared to contralateral side Speeds and Yergason's tests normal. Normal scapular function observed. No painful arc and no drop arm sign. No apprehension sign Lateral side unremarkable.      Impression and Recommendations:     This case required medical decision making of moderate complexity.

## 2015-03-08 NOTE — Patient Instructions (Addendum)
Good to see you The mri is good news.  Thyroid start the medicine.  For the possible nerve start gabapentin at night 100mg  for first week then 200mg  thereafter B12 1067mcg daily B6 200mg  daily Ice or heat will always be helpful.  Continue to remain active.  See me again in 3 weeks.

## 2015-03-08 NOTE — Progress Notes (Signed)
Pre visit review using our clinic review tool, if applicable. No additional management support is needed unless otherwise documented below in the visit note. 

## 2015-03-29 ENCOUNTER — Encounter: Payer: Self-pay | Admitting: Family Medicine

## 2015-03-29 ENCOUNTER — Ambulatory Visit (INDEPENDENT_AMBULATORY_CARE_PROVIDER_SITE_OTHER): Payer: BLUE CROSS/BLUE SHIELD | Admitting: Family Medicine

## 2015-03-29 VITALS — BP 118/82 | HR 75 | Ht 67.0 in | Wt 172.0 lb

## 2015-03-29 DIAGNOSIS — R7989 Other specified abnormal findings of blood chemistry: Secondary | ICD-10-CM | POA: Diagnosis not present

## 2015-03-29 DIAGNOSIS — M5412 Radiculopathy, cervical region: Secondary | ICD-10-CM | POA: Diagnosis not present

## 2015-03-29 NOTE — Progress Notes (Signed)
Pre visit review using our clinic review tool, if applicable. No additional management support is needed unless otherwise documented below in the visit note. 

## 2015-03-29 NOTE — Progress Notes (Signed)
[name] [place],  [address] Phone: [phone] Subjective:     CC: right shoulder pain follow-up  [id number] [name] is a 49 y.o. female coming in with complaint of patient was seen by primary care provider as well as another provider for right shoulder pain.   patient did have more of a subacromial bursitis as well as some mild intersubstance tearing of the rotator cuff. There is concern for possible labral tear with her not improving previously. Patient was sent for an MRI. Patient's MRI did correspond well with the ultrasound showing only a small articular side tear of the rotator cuff but no labral pathology.  Patient continued to have pain. Patient stopped her thyroid medication and then restarted after last visit. Patient states that she is feeling significantly better. This is likely causing more of a brachial neuritis. Patient states that she feels 75% better. No new symptoms. Seems to be making significant improvement..    Past Medical History  Diagnosis Date  . Hyperlipidemia   . Pulmonary nodules     not seen on plain cxr, first detected 1/07 re ct 10/08 pos IPPD > 15 mm 12/09 FOB/lavage 04/20/08 neg afb smear  . Migraine headache   . Iron deficiency anemia   . PPD positive   . Hyperthyroidism     hx 49 years old- oral meds  . Bell's palsy     with res left lag  . External hemorrhoids   . History of pneumonia   . PVC's (premature ventricular contractions)    Past Surgical History  Procedure Laterality Date  . Tubal ligation    . Endometrial ablation    . Hemorrhoid surgery      x 3  . Colonoscopy  05/04/2009    prolapsing external hemorrhoids  . Abdominal hysterectomy     Social History  Substance Use Topics  . Smoking status: Never Smoker   . Smokeless tobacco: Never Used  . Alcohol Use: No   No Known Allergies Family History  Problem Relation Age of Onset  . Heart disease Maternal  Grandmother     aunts, uncles, sister  . Kidney failure Sister   . Lung cancer      aunts and uncles  . Colon cancer Maternal Aunt     aunts and uncles  . Colon cancer Maternal Uncle     Past medical history, social, surgical and family history all reviewed in electronic medical record.   Review of Systems: No headache, visual changes, nausea, vomiting, diarrhea, constipation, dizziness, abdominal pain, skin rash, fevers, chills, night sweats, weight loss, swollen lymph nodes, body aches, joint swelling, muscle aches, chest pain, shortness of breath, mood changes.   Objective Blood pressure 118/82, pulse 75, height 5\' 7"  (1.702 m), weight 172 lb (78.019 kg), SpO2 92 %.  General: No apparent distress alert and oriented x3 mood and affect normal, dressed appropriately.  HEENT: Pupils equal, extraocular movements intact  Respiratory: Patient's speak in full sentences and does not appear short of breath since last visit patient does have goiter noted Cardiovascular: No lower extremity edema, non tender, no erythema  Skin: Warm dry intact with no signs of infection or rash on extremities or on axial skeleton.  Abdomen: Soft nontender  Neuro: Cranial nerves II through XII are intact, neurovascularly intact in all extremities with 2+ DTRs and 2+ pulses.  Lymph: No lymphadenopathy of posterior or anterior cervical chain or axillae bilaterally.  Gait normal  with good balance and coordination.  MSK:  Non tender with full range of motion and good stability and symmetric strength and tone of shoulders, elbows, wrist, hip, knee and ankles bilaterally.  Shoulder: Right Inspection reveals no abnormalities, atrophy or asymmetry. Palpation is normal with no tenderness over AC joint or bicipital groove. Minimal tenderness over the anterior chest wall compared to previous exam ROM is full in all planes passively. Full strength compared to contralateral side Speeds and Yergason's tests normal. Normal  scapular function observed. No painful arc and no drop arm sign. No apprehension sign Contralateral side unremarkable      Impression and Recommendations:     This case required medical decision making of moderate complexity.

## 2015-03-29 NOTE — Assessment & Plan Note (Signed)
Patient has responded fairly well to the thyroid medication and likely the hypothyroidism was was contribute in. We discussed with her to continue the medication at this time and we should recheck labs in approximately 2 months. Patient has any side effects she will give [place] a call. If patient has any worsening symptoms she will call otherwise will follow-up in 2 weeks for further evaluation and treatment.

## 2015-03-29 NOTE — Patient Instructions (Signed)
Good to see you You are doing great We will get labs in 2 months Vitamin D 4000 IU daily Iron 37mg  daily with 500mg  of vitamin C could help but watch for constipation Call me if you need me otherwise see me again in 2 months.

## 2015-05-11 ENCOUNTER — Ambulatory Visit: Payer: BLUE CROSS/BLUE SHIELD | Admitting: Family Medicine

## 2015-05-25 ENCOUNTER — Other Ambulatory Visit (INDEPENDENT_AMBULATORY_CARE_PROVIDER_SITE_OTHER): Payer: BLUE CROSS/BLUE SHIELD

## 2015-05-25 DIAGNOSIS — R7989 Other specified abnormal findings of blood chemistry: Secondary | ICD-10-CM | POA: Diagnosis not present

## 2015-05-25 LAB — TSH: TSH: 1.63 u[IU]/mL (ref 0.35–4.50)

## 2015-05-29 ENCOUNTER — Telehealth: Payer: Self-pay | Admitting: Family Medicine

## 2015-05-29 ENCOUNTER — Ambulatory Visit: Payer: BLUE CROSS/BLUE SHIELD | Admitting: Family Medicine

## 2015-05-29 NOTE — Telephone Encounter (Signed)
Patient would like a call back with lab results  °

## 2015-05-29 NOTE — Telephone Encounter (Signed)
Spoke with patient.

## 2015-05-29 NOTE — Telephone Encounter (Signed)
Labs were perfect would have called but she was supposed to see [place] today and canceled.  Can call and tell her thyroid is perfect.

## 2015-06-26 ENCOUNTER — Ambulatory Visit (INDEPENDENT_AMBULATORY_CARE_PROVIDER_SITE_OTHER): Payer: BLUE CROSS/BLUE SHIELD | Admitting: Family Medicine

## 2015-06-26 ENCOUNTER — Encounter: Payer: Self-pay | Admitting: Family Medicine

## 2015-06-26 VITALS — BP 128/82 | HR 82 | Ht 67.0 in | Wt 168.0 lb

## 2015-06-26 DIAGNOSIS — M75101 Unspecified rotator cuff tear or rupture of right shoulder, not specified as traumatic: Secondary | ICD-10-CM

## 2015-06-26 DIAGNOSIS — E039 Hypothyroidism, unspecified: Secondary | ICD-10-CM | POA: Diagnosis not present

## 2015-06-26 DIAGNOSIS — G43719 Chronic migraine without aura, intractable, without status migrainosus: Secondary | ICD-10-CM

## 2015-06-26 DIAGNOSIS — M5412 Radiculopathy, cervical region: Secondary | ICD-10-CM

## 2015-06-26 NOTE — Assessment & Plan Note (Signed)
Patient's rotator cuff tear seems to be completely healed at this time. I do think that that was more of a nonspecific finding that was contributing to this. Patient is doing somewhat better at this time. No significant changes and follow up as needed.

## 2015-06-26 NOTE — Assessment & Plan Note (Signed)
I think is secondary to more of the thyroid disease. Patient is doing much better and TSH level was at 1.63. Should recheck and proximally 6 months. Otherwise patient in follow-up as needed.

## 2015-06-26 NOTE — Assessment & Plan Note (Signed)
Continue current dose.

## 2015-06-26 NOTE — Progress Notes (Signed)
[name] [place], [place] [address] Phone: [phone] Subjective:     CC: right shoulder pain follow-up  [id number] [name] is a 50 y.o. female coming in with complaint of patient was seen by primary care provider as well as another provider for right shoulder pain.   patient did have more of a subacromial bursitis as well as some mild intersubstance tearing of the rotator cuff. There is concern for possible labral tear with her not improving previously. Patient was sent for an MRI. Patient's MRI did correspond well with the ultrasound showing only a small articular side tear of the rotator cuff but no labral pathology. There is concern for more of a brachial neuritis secondary to thyroid.  Patient was also found to have hypothyroidism. Patient was put back on her medication was making improvement at last visit. Patient states just taken a medication she states that her shoulder is 99% better. Patient states it is no longer giving her any significant difficulty. Continues to have some fatigue. Some lightheadedness from time to time but very seldomly. Seems to be only when she eats sometimes. Denies any passing out. Does make her concern though. Does have a past medical history significant for gallbladder removal.    patient discontinued the gabapentin because she felt much no longer needed. Past Medical History  Diagnosis Date  . Hyperlipidemia   . Pulmonary nodules     not seen on plain cxr, first detected 1/07 re ct 10/08 pos IPPD > 15 mm 12/09 FOB/lavage 04/20/08 neg afb smear  . Migraine headache   . Iron deficiency anemia   . PPD positive   . Hyperthyroidism     hx 50 years old- oral meds  . Bell's palsy     with res left lag  . External hemorrhoids   . History of pneumonia   . PVC's (premature ventricular contractions)    Past Surgical History  Procedure Laterality Date  . Tubal ligation    . Endometrial ablation      . Hemorrhoid surgery      x 3  . Colonoscopy  05/04/2009    prolapsing external hemorrhoids  . Abdominal hysterectomy     Social History  Substance Use Topics  . Smoking status: Never Smoker   . Smokeless tobacco: Never Used  . Alcohol Use: No   No Known Allergies Family History  Problem Relation Age of Onset  . Heart disease Maternal Grandmother     aunts, uncles, sister  . Kidney failure Sister   . Lung cancer      aunts and uncles  . Colon cancer Maternal Aunt     aunts and uncles  . Colon cancer Maternal Uncle     Past medical history, social, surgical and family history all reviewed in electronic medical record.   Review of Systems: No headache, visual changes, nausea, vomiting, diarrhea, constipation, dizziness, abdominal pain, skin rash, fevers, chills, night sweats, weight loss, swollen lymph nodes, body aches, joint swelling, muscle aches, chest pain, shortness of breath, mood changes.   Objective Blood pressure 128/82, pulse 82, height 5\' 7"  (1.702 m), weight 168 lb (76.204 kg), SpO2 98 %.  General: No apparent distress alert and oriented x3 mood and affect normal, dressed appropriately.  HEENT: Pupils equal, extraocular movements intact  Respiratory: Patient's speak in full sentences and does not appear short of breath since last visit patient does have goiter noted Cardiovascular: No lower extremity edema, non  tender, no erythema  Skin: Warm dry intact with no signs of infection or rash on extremities or on axial skeleton.  Abdomen: Soft nontender  Neuro: Cranial nerves II through XII are intact, neurovascularly intact in all extremities with 2+ DTRs and 2+ pulses.  Lymph: No lymphadenopathy of posterior or anterior cervical chain or axillae bilaterally.  Gait normal with good balance and coordination.  MSK:  Non tender with full range of motion and good stability and symmetric strength and tone of shoulders, elbows, wrist, hip, knee and ankles bilaterally.   Shoulder: Right Inspection reveals no abnormalities, atrophy or asymmetry. Palpation is normal with no tenderness over AC joint or bicipital groove. Nontender on exam ROM is full in all planes passively. Full strength compared to contralateral side Speeds and Yergason's tests normal. Normal scapular function observed. No painful arc and no drop arm sign. No apprehension sign Contralateral side unremarkable      Impression and Recommendations:     This case required medical decision making of moderate complexity.

## 2015-06-26 NOTE — Assessment & Plan Note (Signed)
Seems better controlled at this time but continues to have some dizziness. History of gallbladder removal. Questionable early no pain syndrome. Patient will discuss with surgeon at some point she stated.

## 2015-06-26 NOTE — Patient Instructions (Signed)
Good to see you  Ice is your friend if the shoulder acts up Continue the thyroid.  Stop the gabapentin  Look up dumping syndrome  Consider taking Bromelain with each meal to help with absorption.  See me when you need me. If you want we may want to check the thyroid again in 6 months.

## 2015-06-26 NOTE — Progress Notes (Signed)
Pre visit review using our clinic review tool, if applicable. No additional management support is needed unless otherwise documented below in the visit note. 

## 2015-07-06 ENCOUNTER — Other Ambulatory Visit: Payer: Self-pay | Admitting: Family Medicine

## 2015-07-06 ENCOUNTER — Telehealth: Payer: Self-pay | Admitting: Family Medicine

## 2015-07-06 MED ORDER — LEVOTHYROXINE SODIUM 75 MCG PO TABS: 75.0000 ug | ORAL_TABLET | Freq: Every day | ORAL | Status: DC

## 2015-07-06 NOTE — Telephone Encounter (Signed)
done

## 2015-07-06 NOTE — Telephone Encounter (Signed)
Patient states that Dr. Sarajane Jews took her off of levothyroxine.  After Dr. Tamala Julian did blood work he instructed patient to go back on levothyroxine.  He told patient if she needed refills to let him know.  She is running out of this medication and needs it sent to [facility] in [place].  I did inform patient that Dr. Tamala Julian is out of the office until Monday.

## 2015-08-23 DIAGNOSIS — J019 Acute sinusitis, unspecified: Secondary | ICD-10-CM | POA: Diagnosis not present

## 2015-09-25 ENCOUNTER — Other Ambulatory Visit: Payer: Self-pay | Admitting: Family Medicine

## 2015-10-09 ENCOUNTER — Other Ambulatory Visit (INDEPENDENT_AMBULATORY_CARE_PROVIDER_SITE_OTHER): Payer: BLUE CROSS/BLUE SHIELD

## 2015-10-09 ENCOUNTER — Ambulatory Visit (INDEPENDENT_AMBULATORY_CARE_PROVIDER_SITE_OTHER): Payer: BLUE CROSS/BLUE SHIELD | Admitting: Family Medicine

## 2015-10-09 VITALS — BP 128/78 | HR 84 | Wt 169.0 lb

## 2015-10-09 DIAGNOSIS — R0683 Snoring: Secondary | ICD-10-CM | POA: Insufficient documentation

## 2015-10-09 DIAGNOSIS — E039 Hypothyroidism, unspecified: Secondary | ICD-10-CM

## 2015-10-09 DIAGNOSIS — G4700 Insomnia, unspecified: Secondary | ICD-10-CM

## 2015-10-09 LAB — T3, FREE: T3, Free: 3.5 pg/mL (ref 2.3–4.2)

## 2015-10-09 LAB — TSH: TSH: 2.15 u[IU]/mL (ref 0.35–4.50)

## 2015-10-09 LAB — T4, FREE: Free T4: 0.88 ng/dL (ref 0.60–1.60)

## 2015-10-09 MED ORDER — HYDROXYZINE HCL 10 MG PO TABS: 10.0000 mg | ORAL_TABLET | Freq: Three times a day (TID) | ORAL | Status: DC | PRN

## 2015-10-09 NOTE — Patient Instructions (Signed)
Good to see you  We will get labs They will call you on sleep study Try the hydroxyzine up to 3 times a day to see if it helps See me again in 3 weeks to make sure we are making progress.

## 2015-10-09 NOTE — Assessment & Plan Note (Signed)
Sleep study order placed

## 2015-10-09 NOTE — Assessment & Plan Note (Signed)
Patient does have hypothyroidism. Patient does state that she does have increasing fatigue as well as does snore. We will send her for a sleep study. Labs today to check thyroid. If patient's TSH is elevated we will increase dose. Patient is also having some stressed given some hydroxyzine to see if this will be beneficial. Follow-up with me again in 2-3 weeks.

## 2015-10-09 NOTE — Progress Notes (Signed)
[name] [place], [place] [address] Phone: [phone] Subjective:     [id number]  [id number] [name] is a 50 y.o. female coming in with complaint of  Patient was also found to have hypothyroidism. Patient was put back on her medication was making improvement. Patient states unfortunately over the course last couple months she has started having increasing fatigue in. Having difficulty with sleep. Becoming much more stressed over little pains. Feels that this is the same symptoms she was having before. Patient is wondering if her doses correct. Been taking the medication religiously. Patient does snore and states that it is even seem to be worsening. Mild increase and waking as well. Lab Results  Component Value Date   TSH 1.63 05/25/2015  [name] 5 months ago     Past Medical History  Diagnosis Date  . Hyperlipidemia   . Pulmonary nodules     not seen on plain cxr, first detected 1/07 re ct 10/08 pos IPPD > 15 mm 12/09 FOB/lavage 04/20/08 neg afb smear  . Migraine headache   . Iron deficiency anemia   . PPD positive   . Hyperthyroidism     hx 50 years old- oral meds  . Bell's palsy     with res left lag  . External hemorrhoids   . History of pneumonia   . PVC's (premature ventricular contractions)    Past Surgical History  Procedure Laterality Date  . Tubal ligation    . Endometrial ablation    . Hemorrhoid surgery      x 3  . Colonoscopy  05/04/2009    prolapsing external hemorrhoids  . Abdominal hysterectomy     Social History  Substance Use Topics  . Smoking status: Never Smoker   . Smokeless tobacco: Never Used  . Alcohol Use: No   No Known Allergies Family History  Problem Relation Age of Onset  . Heart disease Maternal Grandmother     aunts, uncles, sister  . Kidney failure Sister   . Lung cancer      aunts and uncles  . Colon cancer Maternal Aunt     aunts and uncles  . Colon cancer  Maternal Uncle     Past medical history, social, surgical and family history all reviewed in electronic medical record.   Review of Systems: No headache, visual changes, nausea, vomiting, diarrhea, constipation, dizziness, abdominal pain, skin rash, fevers, chills, night sweats, weight loss, swollen lymph nodes, body aches, joint swelling, muscle aches, chest pain, shortness of breath, mood changes.   Objective Blood pressure 128/78, pulse 84, weight 169 lb (76.658 kg).  General: No apparent distress alert and oriented x3 mood and affect normal, dressed appropriately.  HEENT: Pupils equal, extraocular movements intact  Respiratory: Patient's speak in full sentences and does not appear short of breath since last visit patient does have goiter noted still with no change in size Cardiovascular: No lower extremity edema, non tender, no erythema  Skin: Warm dry intact with no signs of infection or rash on extremities or on axial skeleton.  Abdomen: Soft nontender  Neuro: Cranial nerves II through XII are intact, neurovascularly intact in all extremities with 2+ DTRs and 2+ pulses.  Lymph: No lymphadenopathy of posterior or anterior cervical chain or axillae bilaterally.  Gait normal with good balance and coordination.  MSK:  Non tender with full range of motion and good stability and symmetric strength and tone of shoulders, elbows, wrist, hip, knee  and ankles bilaterally.        Impression and Recommendations:     This case required medical decision making of moderate complexity.

## 2015-10-10 ENCOUNTER — Encounter: Payer: Self-pay | Admitting: Family Medicine

## 2015-10-10 MED ORDER — LEVOTHYROXINE SODIUM 88 MCG PO TABS: 88.0000 ug | ORAL_TABLET | Freq: Every day | ORAL | Status: DC

## 2015-10-11 ENCOUNTER — Encounter: Payer: Self-pay | Admitting: Family Medicine

## 2015-11-10 ENCOUNTER — Ambulatory Visit: Payer: BLUE CROSS/BLUE SHIELD | Admitting: Family Medicine

## 2015-11-20 ENCOUNTER — Encounter: Payer: Self-pay | Admitting: *Deleted

## 2015-11-20 ENCOUNTER — Encounter: Payer: Self-pay | Admitting: Family Medicine

## 2015-11-20 ENCOUNTER — Ambulatory Visit (INDEPENDENT_AMBULATORY_CARE_PROVIDER_SITE_OTHER): Payer: BLUE CROSS/BLUE SHIELD | Admitting: Family Medicine

## 2015-11-20 VITALS — BP 124/80 | HR 77 | Temp 98.2°F | Ht 67.0 in | Wt 164.8 lb

## 2015-11-20 DIAGNOSIS — J069 Acute upper respiratory infection, unspecified: Secondary | ICD-10-CM

## 2015-11-20 MED ORDER — HYDROCODONE-HOMATROPINE 5-1.5 MG/5ML PO SYRP: 5.0000 mL | ORAL_SOLUTION | Freq: Three times a day (TID) | ORAL | Status: DC | PRN

## 2015-11-20 NOTE — Patient Instructions (Signed)
INSTRUCTIONS FOR UPPER RESPIRATORY INFECTION:  -nasal saline wash 2-3 times daily (use prepackaged nasal saline or bottled/distilled water if making your own)   -can use AFRIN nasal spray for drainage and nasal congestion - but do NOT use longer then 3-4 days  -can use tylenol (in no history of liver disease) or ibuprofen (if no history of kidney disease, bowel bleeding or significant heart disease) as directed for aches and sorethroat  -in the winter time, using a humidifier at night is helpful (please follow cleaning instructions)  -if you are taking a cough medication - use only as directed, may also try a teaspoon of honey to coat the throat and throat lozenges. If given a cough medication with codeine or hydrocodone or other narcotic please be advised that this contains a strong and  potentially addicting medication. Please follow instructions carefully, take as little as possible and only use AS NEEDED for severe cough. Discuss potential side effects with your pharmacy. Please do not drive or operate machinery while taking these types of medications. Please do not take other sedating medications, drugs or alcohol while taking this medication without discussing with your doctor.  -for sore throat, salt water gargles can help  -follow up if you have fevers, facial pain, tooth pain, difficulty breathing or are worsening or symptoms persist longer then expected  Upper Respiratory Infection, Adult An upper respiratory infection (URI) is also known as the common cold. It is often caused by a type of germ (virus). Colds are easily spread (contagious). You can pass it to others by kissing, coughing, sneezing, or drinking out of the same glass. Usually, you get better in 1 to 3  weeks.  However, the cough can last for even longer. HOME CARE   Only take medicine as told by your doctor. Follow instructions provided above.  Drink enough water and fluids to keep your pee (urine) clear or pale  yellow.  Get plenty of rest.  Return to work when your temperature is < 100 for 24 hours or as told by your doctor. You may use a face mask and wash your hands to stop your cold from spreading. GET HELP RIGHT AWAY IF:   After the first few days, you feel you are getting worse.  You have questions about your medicine.  You have chills, shortness of breath, or red spit (mucus).  You have pain in the face for more then 1-2 days, especially when you bend forward.  You have a fever, puffy (swollen) neck, pain when you swallow, or white spots in the back of your throat.  You have a bad headache, ear pain, sinus pain, or chest pain.  You have a high-pitched whistling sound when you breathe in and out (wheezing).  You cough up blood.  You have sore muscles or a stiff neck. MAKE SURE YOU:   Understand these instructions.  Will watch your condition.  Will get help right away if you are not doing well or get worse. Document Released: 10/09/2007 Document Revised: 07/15/2011 Document Reviewed: 07/28/2013 [employer] Patient Information 2015 [employer]. This information is not intended to replace advice given to you by your health care provider. Make sure you discuss any questions you have with your health care provider.  

## 2015-11-20 NOTE — Progress Notes (Signed)
HPI:  Acute visit for URI: -started: 3 days ago -symptoms:nasal congestion, sore throat, cough -denies:fever, SOB, NVD, tooth pain -has tried: dayquil -sick contacts/travel/risks: no reported flu, strep or tick exposure -report PCP always gives her hycodan and he demands this  ROS: See pertinent positives and negatives per HPI.  Past Medical History  Diagnosis Date  . Hyperlipidemia   . Pulmonary nodules     not seen on plain cxr, first detected 1/07 re ct 10/08 pos IPPD > 15 mm 12/09 FOB/lavage 04/20/08 neg afb smear  . Migraine headache   . Iron deficiency anemia   . PPD positive   . Hyperthyroidism     hx 50 years old- oral meds  . Bell's palsy     with res left lag  . External hemorrhoids   . History of pneumonia   . PVC's (premature ventricular contractions)     Past Surgical History  Procedure Laterality Date  . Tubal ligation    . Endometrial ablation    . Hemorrhoid surgery      x 3  . Colonoscopy  05/04/2009    prolapsing external hemorrhoids  . Abdominal hysterectomy      Family History  Problem Relation Age of Onset  . Heart disease Maternal Grandmother     aunts, uncles, sister  . Kidney failure Sister   . Lung cancer      aunts and uncles  . Colon cancer Maternal Aunt     aunts and uncles  . Colon cancer Maternal Uncle     Social History   Social History  . Marital Status: Divorced    Spouse Name: N/A  . Number of Children: 2  . Years of Education: N/A   Occupational History  . 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 Smoking status: Never Smoker   . Smokeless tobacco: Never Used  . Alcohol Use: No  . Drug Use: No  . Sexual Activity: Not Asked   Other Topics Concern  . None   Social History Narrative   Divorced with children   Occupation Media planner   Possible TB exposure 2008 (after nodules found)   From [place] lived there and [place]   Never owned birds           Current outpatient  prescriptions:  .  aspirin-acetaminophen-caffeine ([employer]) [id number] MG per tablet, Take 1 tablet by mouth every 6 (six) hours as needed (for migraines). , Disp: , Rfl:  .  hydrOXYzine (ATARAX/VISTARIL) 10 MG tablet, Take 1-2 tablets (10-20 mg total) by mouth 3 (three) times daily as needed., Disp: 90 tablet, Rfl: 0 .  Ibuprofen-Famotidine 800-26.6 MG TABS, Take 1 tablet 3 times daily as needed., Disp: 270 tablet, Rfl: 1 .  levothyroxine (SYNTHROID, LEVOTHROID) 88 MCG tablet, Take 1 tablet (88 mcg total) by mouth daily., Disp: 90 tablet, Rfl: 1 .  losartan-hydrochlorothiazide (HYZAAR) 100-12.5 MG per tablet, Take 1 tablet by mouth daily., Disp: 90 tablet, Rfl: 3 .  ZOLMitriptan (ZOMIG) 2.5 MG tablet, TAKE ONE TABLET BY MOUTH AS NEEDED FOR  MIGRAINE, Disp: 10 tablet, Rfl: 0 .  HYDROcodone-homatropine (HYCODAN) 5-1.5 MG/5ML syrup, Take 5 mLs by mouth every 8 (eight) hours as needed for cough., Disp: 120 mL, Rfl: 0  Current facility-administered medications:  .  promethazine (PHENERGAN) injection 25 mg, 25 mg, Intravenous, Q6H PRN, Dorena Cookey, MD, 25 mg at 05/09/11 1731  EXAM:  Filed Vitals:   11/20/15 1030  BP: 124/80  Pulse: 77  Temp: 98.2 F (36.8 C)    Body mass index is 25.81 kg/(m^2).  GENERAL: vitals reviewed and listed above, alert, oriented, appears well hydrated and in no acute distress  HEENT: atraumatic, conjunttiva clear, no obvious abnormalities on inspection of external nose and ears, normal appearance of ear canals and TMs, clear nasal congestion, mild post oropharyngeal erythema with PND, no tonsillar edema or exudate, no sinus TTP  NECK: no obvious masses on inspection  LUNGS: clear to auscultation bilaterally, no wheezes, rales or rhonchi, good air movement  CV: HRRR, no peripheral edema  MS: moves all extremities without noticeable abnormality  PSYCH: pleasant and cooperative, no obvious depression or anxiety  ASSESSMENT AND  PLAN:  Discussed the following assessment and plan:  Acute upper respiratory infection  -given HPI and exam findings today, a serious infection or illness is unlikely. We discussed potential etiologies, with VURI being most likely, and advised supportive care and monitoring. We discussed treatment side effects, likely course, antibiotic misuse, transmission, and signs of developing a serious illness. -of course, we advised to return or notify a doctor immediately if symptoms worsen or persist or new concerns arise.    Patient Instructions  INSTRUCTIONS FOR UPPER RESPIRATORY INFECTION:  -nasal saline wash 2-3 times daily (use prepackaged nasal saline or bottled/distilled water if making your own)   -can use AFRIN nasal spray for drainage and nasal congestion - but do NOT use longer then 3-4 days  -can use tylenol (in no history of liver disease) or ibuprofen (if no history of kidney disease, bowel bleeding or significant heart disease) as directed for aches and sorethroat  -in the winter time, using a humidifier at night is helpful (please follow cleaning instructions)  -if you are taking a cough medication - use only as directed, may also try a teaspoon of honey to coat the throat and throat lozenges. If given a cough medication with codeine or hydrocodone or other narcotic please be advised that this contains a strong and  potentially addicting medication. Please follow instructions carefully, take as little as possible and only use AS NEEDED for severe cough. Discuss potential side effects with your pharmacy. Please do not drive or operate machinery while taking these types of medications. Please do not take other sedating medications, drugs or alcohol while taking this medication without discussing with your doctor.  -for sore throat, salt water gargles can help  -follow up if you have fevers, facial pain, tooth pain, difficulty breathing or are worsening or symptoms persist longer then  expected  Upper Respiratory Infection, Adult An upper respiratory infection (URI) is also known as the common cold. It is often caused by a type of germ (virus). Colds are easily spread (contagious). You can pass it to others by kissing, coughing, sneezing, or drinking out of the same glass. Usually, you get better in 1 to 3  weeks.  However, the cough can last for even longer. HOME CARE   Only take medicine as told by your doctor. Follow instructions provided above.  Drink enough water and fluids to keep your pee (urine) clear or pale yellow.  Get plenty of rest.  Return to work when your temperature is < 100 for 24 hours or as told by your doctor. You may use a face mask and wash your hands to stop your cold from spreading. GET HELP RIGHT AWAY IF:   After the first few days, you feel you are getting worse.  You have  questions about your medicine.  You have chills, shortness of breath, or red spit (mucus).  You have pain in the face for more then 1-2 days, especially when you bend forward.  You have a fever, puffy (swollen) neck, pain when you swallow, or white spots in the back of your throat.  You have a bad headache, ear pain, sinus pain, or chest pain.  You have a high-pitched whistling sound when you breathe in and out (wheezing).  You cough up blood.  You have sore muscles or a stiff neck. MAKE SURE YOU:   Understand these instructions.  Will watch your condition.  Will get help right away if you are not doing well or get worse. Document Released: 10/09/2007 Document Revised: 07/15/2011 Document Reviewed: 07/28/2013 [facility] Patient Information 2015 [place], [place]. This information is not intended to replace advice given to you by your health care provider. Make sure you discuss any questions you have with your health care provider.     Colin Benton R., DO

## 2015-11-20 NOTE — Progress Notes (Signed)
Pre visit review using our clinic review tool, if applicable. No additional management support is needed unless otherwise documented below in the visit note. 

## 2015-11-21 ENCOUNTER — Encounter: Payer: Self-pay | Admitting: Family Medicine

## 2015-11-21 ENCOUNTER — Ambulatory Visit (INDEPENDENT_AMBULATORY_CARE_PROVIDER_SITE_OTHER): Payer: BLUE CROSS/BLUE SHIELD | Admitting: Family Medicine

## 2015-11-21 VITALS — BP 118/82 | HR 82 | Wt 167.0 lb

## 2015-11-21 DIAGNOSIS — J209 Acute bronchitis, unspecified: Secondary | ICD-10-CM | POA: Diagnosis not present

## 2015-11-21 MED ORDER — AMOXICILLIN-POT CLAVULANATE 875-125 MG PO TABS: 1.0000 | ORAL_TABLET | Freq: Two times a day (BID) | ORAL | Status: DC

## 2015-11-21 MED ORDER — ALBUTEROL SULFATE HFA 108 (90 BASE) MCG/ACT IN AERS: 2.0000 | INHALATION_SPRAY | RESPIRATORY_TRACT | Status: DC | PRN

## 2015-11-21 NOTE — Progress Notes (Signed)
SUBJECTIVE:  [name] is a 49 y.o. female who complains of congestion, sore throat, nasal blockage, dry cough, headache, bilateral sinus pain, chills and pain while swallowing for 14 days. She denies a history of anorexia, chest pain, dizziness, fatigue, fevers, shortness of breath and weight loss and denies a history of asthma. Patient used to smoke cigarettes.  Past Medical History  Diagnosis Date  . Hyperlipidemia   . Pulmonary nodules     not seen on plain cxr, first detected 1/07 re ct 10/08 pos IPPD > 15 mm 12/09 FOB/lavage 04/20/08 neg afb smear  . Migraine headache   . Iron deficiency anemia   . PPD positive   . Hyperthyroidism     hx 50 years old- oral meds  . Bell's palsy     with res left lag  . External hemorrhoids   . History of pneumonia   . PVC's (premature ventricular contractions)    Past Surgical History  Procedure Laterality Date  . Tubal ligation    . Endometrial ablation    . Hemorrhoid surgery      x 3  . Colonoscopy  05/04/2009    prolapsing external hemorrhoids  . Abdominal hysterectomy     Social History  Substance Use Topics  . Smoking status: Never Smoker   . Smokeless tobacco: Never Used  . Alcohol Use: No   No Known Allergies Family History  Problem Relation Age of Onset  . Heart disease Maternal Grandmother     aunts, uncles, sister  . Kidney failure Sister   . Lung cancer      aunts and uncles  . Colon cancer Maternal Aunt     aunts and uncles  . Colon cancer Maternal Uncle       OBJECTIVE: Blood pressure 118/82, pulse 82, weight 167 lb (75.751 kg).  She appears well, vital signs are as noted. Ears normal.  Throat and pharynx Postnasal drip and erythema.  Neck supple Mild enlargement of the thyroid. Anterior cervical lymphadenopathy. Nose is congested. Sinuses tender to palpation and percussion frontal and maxillary. The chest is clear, with mild wheezes  ASSESSMENT:  viral upper respiratory illness and  bronchiolitis  PLAN: Symptomatic therapy suggested: push fluids, rest, return office visit prn if symptoms persist or worsen and rx for antibiotic and albuterol.  Come back if worsening symptoms or fever on abx.

## 2015-11-21 NOTE — Patient Instructions (Signed)
Good to see you  Feel better Augmentin 2 times a day for next 10 days Albuterol inhaler if you feel you are weezing but may make you shaky.  Cough syrup you have See someone again if not better in 2 weeks. You should be much better in 3 days.

## 2015-12-14 ENCOUNTER — Other Ambulatory Visit: Payer: Self-pay | Admitting: Family Medicine

## 2015-12-14 DIAGNOSIS — Z1231 Encounter for screening mammogram for malignant neoplasm of breast: Secondary | ICD-10-CM

## 2016-01-04 ENCOUNTER — Ambulatory Visit: Payer: BLUE CROSS/BLUE SHIELD

## 2016-01-12 ENCOUNTER — Ambulatory Visit
Admission: RE | Admit: 2016-01-12 | Discharge: 2016-01-12 | Disposition: A | Discharge status: Home / Self Care | Payer: BLUE CROSS/BLUE SHIELD | Source: Ambulatory Visit | Attending: Family Medicine | Admitting: Family Medicine

## 2016-01-12 DIAGNOSIS — Z1231 Encounter for screening mammogram for malignant neoplasm of breast: Secondary | ICD-10-CM

## 2016-01-23 ENCOUNTER — Other Ambulatory Visit: Payer: Self-pay | Admitting: Family Medicine

## 2016-02-12 ENCOUNTER — Institutional Professional Consult (permissible substitution): Payer: BLUE CROSS/BLUE SHIELD | Admitting: Pulmonary Disease

## 2016-02-13 ENCOUNTER — Institutional Professional Consult (permissible substitution): Payer: BLUE CROSS/BLUE SHIELD | Admitting: Pulmonary Disease

## 2016-02-13 ENCOUNTER — Ambulatory Visit (INDEPENDENT_AMBULATORY_CARE_PROVIDER_SITE_OTHER): Payer: BLUE CROSS/BLUE SHIELD | Admitting: Adult Health

## 2016-02-13 ENCOUNTER — Encounter: Payer: Self-pay | Admitting: Adult Health

## 2016-02-13 VITALS — BP 152/90 | Temp 98.2°F | Ht 67.0 in | Wt 170.2 lb

## 2016-02-13 DIAGNOSIS — I10 Essential (primary) hypertension: Secondary | ICD-10-CM | POA: Diagnosis not present

## 2016-02-13 DIAGNOSIS — G43719 Chronic migraine without aura, intractable, without status migrainosus: Secondary | ICD-10-CM

## 2016-02-13 DIAGNOSIS — Z7689 Persons encountering health services in other specified circumstances: Secondary | ICD-10-CM

## 2016-02-13 MED ORDER — LOSARTAN POTASSIUM-HCTZ 100-25 MG PO TABS: 1.0000 | ORAL_TABLET | Freq: Every day | ORAL | 3 refills | Status: DC

## 2016-02-13 MED ORDER — SUMATRIPTAN SUCCINATE 50 MG PO TABS: 50.0000 mg | ORAL_TABLET | ORAL | 3 refills | Status: DC | PRN

## 2016-02-13 NOTE — Patient Instructions (Signed)
It was great seeing you today!  I have change your migraine medicine from Zomig to Imitrex.   I have also increased your Hyzaar. Please monitor blood pressure and write it down. Bring log to next appointment  Work on changing diet and exercising more often   Follow up for CPE

## 2016-02-13 NOTE — Progress Notes (Signed)
Patient presents to clinic today to establish care. She is a pleasant 50 year old female who  has a past medical history of Bell's palsy; Essential hypertension; External hemorrhoids; History of pneumonia; Hyperlipidemia; Hyperthyroidism; Iron deficiency anemia; Migraine headache; PPD positive; Pulmonary nodules; and PVC's (premature ventricular contractions).   Her last physical was in 2013 with MD Sherren Mocha.   Acute Concerns: Establish Care    Chronic Issues: Migraines-  She has migraines "every 4-6 months" Her last one was this past weekend. She has only ever tried Zomig for her migraines and does not feel like it is working as well as it used to.   Hypertension  - She does not monitor her blood pressure at home. Has been taking Hyzaar 100-12.5 mg daily as directed.   Health Maintenance: Dental -- Routine Care  Vision -- Routine Care Immunizations -- UTD - Does not want flu.  Colonoscopy -- 2010  Mammogram -- 01/2016  PAP -- Had hysterectomy  Diet: "Aweful" - she does not eat anything healthy. Eats fast food often  Exercise: Does not exercise    Past Medical History:  Diagnosis Date  . Bell's palsy    with res left lag  . External hemorrhoids   . History of pneumonia   . Hyperlipidemia   . Hyperthyroidism    hx 50 years old- oral meds  . Iron deficiency anemia   . Migraine headache   . PPD positive   . Pulmonary nodules    not seen on plain cxr, first detected 1/07 re ct 10/08 pos IPPD > 15 mm 12/09 FOB/lavage 04/20/08 neg afb smear  . PVC's (premature ventricular contractions)     Past Surgical History:  Procedure Laterality Date  . ABDOMINAL HYSTERECTOMY    . COLONOSCOPY  05/04/2009   prolapsing external hemorrhoids  . ENDOMETRIAL ABLATION    . HEMORRHOID SURGERY     x 3  . TUBAL LIGATION      Current Outpatient Prescriptions on File Prior to Visit  Medication Sig Dispense Refill  . albuterol (PROVENTIL HFA;VENTOLIN HFA) 108 (90 Base) MCG/ACT inhaler  Inhale 2 puffs into the lungs every 4 (four) hours as needed for wheezing or shortness of breath. 1 each 6  . aspirin-acetaminophen-caffeine (EXCEDRIN EXTRA STRENGTH) 250-250-65 MG per tablet Take 1 tablet by mouth every 6 (six) hours as needed (for migraines).     Marland Kitchen HYDROcodone-homatropine (HYCODAN) 5-1.5 MG/5ML syrup Take 5 mLs by mouth every 8 (eight) hours as needed for cough. 120 mL 0  . hydrOXYzine (ATARAX/VISTARIL) 10 MG tablet Take 1-2 tablets (10-20 mg total) by mouth 3 (three) times daily as needed. 90 tablet 0  . Ibuprofen-Famotidine 800-26.6 MG TABS Take 1 tablet 3 times daily as needed. 270 tablet 1  . levothyroxine (SYNTHROID, LEVOTHROID) 88 MCG tablet Take 1 tablet (88 mcg total) by mouth daily. 90 tablet 1  . losartan-hydrochlorothiazide (HYZAAR) 100-12.5 MG tablet TAKE ONE TABLET BY MOUTH ONCE DAILY 30 tablet 0  . ZOLMitriptan (ZOMIG) 2.5 MG tablet TAKE ONE TABLET BY MOUTH AS NEEDED FOR  MIGRAINE 10 tablet 0   Current Facility-Administered Medications on File Prior to Visit  Medication Dose Route Frequency Provider Last Rate Last Dose  . promethazine (PHENERGAN) injection 25 mg  25 mg Intravenous Q6H PRN Dorena Cookey, MD   25 mg at 05/09/11 1731    No Known Allergies  Family History  Problem Relation Age of Onset  . Heart disease Maternal Grandmother     aunts,  uncles, sister  . Kidney failure Sister   . Lung cancer      aunts and uncles  . Colon cancer Maternal Aunt     aunts and uncles  . Colon cancer Maternal Uncle     Social History   Social History  . Marital status: Divorced    Spouse name: N/A  . Number of children: 2  . 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 Smoking status: Never Smoker  . Smokeless tobacco: Never Used  . Alcohol use No  . Drug use: No  . Sexual activity: Not on file   Other Topics Concern  . Not on file   Social History Narrative   Divorced with children     Occupation [employer]   Possible TB exposure 2008 (after nodules found)   From [place] lived there and [place]   Never owned birds          Review of Systems  Constitutional: Negative.   HENT: Negative.   Respiratory: Negative.   Cardiovascular: Negative.   Genitourinary: Negative.   Skin: Negative.   Neurological: Negative.   Psychiatric/Behavioral: Negative.   All other systems reviewed and are negative.   BP (!) 152/90   Temp 98.2 F (36.8 C) (Oral)   Ht 5\' 7"  (1.702 m)   Wt 170 lb 3.2 oz (77.2 kg)   BMI 26.66 kg/m   Physical Exam  Constitutional: She is oriented to person, place, and time and well-developed, well-nourished, and in no distress. No distress.  HENT:  Head: Normocephalic and atraumatic.  Right Ear: External ear normal.  Left Ear: External ear normal.  Nose: Nose normal.  Mouth/Throat: Oropharynx is clear and moist. No oropharyngeal exudate.  Eyes: Conjunctivae and EOM are normal. Pupils are equal, round, and reactive to light. Right eye exhibits no discharge. Left eye exhibits no discharge. No scleral icterus.  Neck: Normal range of motion. Neck supple. No JVD present. No tracheal deviation present. No thyromegaly present.  Cardiovascular: Normal rate, regular rhythm, normal heart sounds and intact distal pulses.  Exam reveals no gallop and no friction rub.   No murmur heard. Pulmonary/Chest: Effort normal and breath sounds normal. No stridor. No respiratory distress. She has no wheezes. She has no rales. She exhibits no tenderness.  Musculoskeletal: Normal range of motion. She exhibits no edema or tenderness.  Lymphadenopathy:    She has no cervical adenopathy.  Neurological: She is alert and oriented to person, place, and time. She displays normal reflexes. No cranial nerve deficit. She exhibits normal muscle tone. Gait normal. Coordination normal. GCS score is 15.  Skin: Skin is warm and dry. No rash noted. She is not diaphoretic. No  erythema. No pallor.  Psychiatric: Mood, memory, affect and judgment normal.  Nursing note and vitals reviewed.  Assessment/Plan: 1. Encounter to establish care - Follow up for CPE - Follow up sooner if needed - Encouraged a heart healthy diet and frequent aerobic exercise   2. Essential hypertension - Increase Hyzaar - losartan-hydrochlorothiazide (HYZAAR) 100-25 MG tablet; Take 1 tablet by mouth daily.  Dispense: 90 tablet; Refill: 3 - Check BP at home and bring log to next visit.  - Needs to eat healthier and start exerrcising - Consider adding an additional agent - Follow up with at physical  3. Intractable chronic migraine without aura and without status migrainosus - D/C Zomig  - SUMAtriptan (IMITREX) 50  MG tablet; Take 1 tablet (50 mg total) by mouth every 2 (two) hours as needed for migraine. May repeat in 2 hours if headache persists or recurs.  Dispense: 10 tablet; Refill: 3 - Consider referral to headache center  [name] Peng, NP

## 2016-02-22 ENCOUNTER — Ambulatory Visit (INDEPENDENT_AMBULATORY_CARE_PROVIDER_SITE_OTHER): Payer: BLUE CROSS/BLUE SHIELD | Admitting: Adult Health

## 2016-02-22 ENCOUNTER — Encounter: Payer: Self-pay | Admitting: Adult Health

## 2016-02-22 VITALS — BP 112/64 | Temp 99.6°F | Ht 67.0 in | Wt 165.4 lb

## 2016-02-22 DIAGNOSIS — J0110 Acute frontal sinusitis, unspecified: Secondary | ICD-10-CM | POA: Diagnosis not present

## 2016-02-22 DIAGNOSIS — J029 Acute pharyngitis, unspecified: Secondary | ICD-10-CM | POA: Diagnosis not present

## 2016-02-22 LAB — POCT RAPID STREP A (OFFICE): Rapid Strep A Screen: NEGATIVE

## 2016-02-22 MED ORDER — AMOXICILLIN-POT CLAVULANATE 875-125 MG PO TABS: 1.0000 | ORAL_TABLET | Freq: Two times a day (BID) | ORAL | 0 refills | Status: DC

## 2016-02-22 MED ORDER — METHYLPREDNISOLONE 4 MG PO TBPK: ORAL_TABLET | ORAL | 0 refills | Status: DC

## 2016-02-22 MED ORDER — MAGIC MOUTHWASH W/LIDOCAINE: 5.0000 mL | Freq: Three times a day (TID) | ORAL | 0 refills | Status: DC | PRN

## 2016-02-22 NOTE — Patient Instructions (Signed)
Your strep test was negative   I am going to treat you with Augmentin and prednisone. I have also prescribed magic mouth wash, which will help with the sore throat. Gargle this for about 15 seconds and spit it out.   Follow up if no improvement

## 2016-02-22 NOTE — Progress Notes (Signed)
Subjective:    Patient ID: [name], female    DOB: 10-Sep-1965, 50 y.o.   MRN: [id number]  URI   This is a new problem. The current episode started in the past 7 days. The problem has been gradually worsening. Maximum temperature: subjective  The fever has been present for 3 to 4 days. Associated symptoms include congestion, coughing, ear pain, headaches, sinus pain, a sore throat and swollen glands. Pertinent negatives include no diarrhea, joint pain, nausea, rhinorrhea or vomiting. She has tried decongestant and acetaminophen for the symptoms. The treatment provided mild relief.      Review of Systems  Constitutional: Positive for activity change, appetite change, chills, diaphoresis, fatigue and fever.  HENT: Positive for congestion, ear pain, sinus pressure, sore throat, trouble swallowing and voice change. Negative for rhinorrhea.   Eyes: Negative.   Respiratory: Positive for cough.   Cardiovascular: Negative.   Gastrointestinal: Negative for diarrhea, nausea and vomiting.  Genitourinary: Negative.   Musculoskeletal: Negative for joint pain.  Skin: Negative.   Neurological: Positive for headaches.  Psychiatric/Behavioral: Negative.   All other systems reviewed and are negative.  Past Medical History:  Diagnosis Date  . Bell's palsy    with res left lag  . Essential hypertension   . External hemorrhoids   . History of pneumonia   . Hyperlipidemia   . Hyperthyroidism    hx 50 years old- oral meds  . Iron deficiency anemia   . Migraine headache   . PPD positive   . Pulmonary nodules    not seen on plain cxr, first detected 1/07 re ct 10/08 pos IPPD > 15 mm 12/09 FOB/lavage 04/20/08 neg afb smear  . PVC's (premature ventricular contractions)     Social History   Social History  . Marital status: Divorced    Spouse name: N/A  . Number of children: 2  . 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 Smoking status: Never Smoker  . Smokeless tobacco: Never Used  . Alcohol use No  . Drug use: No  . Sexual activity: Not on file   Other Topics Concern  . Not on file   Social History Narrative   Divorced with children   She works in Press [employer]    Possible TB exposure 2008 (after nodules found)   From [place] lived there and [place]   Never owned birds          Past Surgical History:  Procedure Laterality Date  . ABDOMINAL HYSTERECTOMY    . CHOLECYSTECTOMY  2014  . COLONOSCOPY  05/04/2009   prolapsing external hemorrhoids  . ENDOMETRIAL ABLATION    . HEMORRHOID SURGERY     x 3  . TUBAL LIGATION      Family History  Problem Relation Age of Onset  . Heart disease Maternal Grandmother     aunts, uncles, sister  . Diabetes Mellitus II Maternal Grandmother   . Kidney failure Sister   . Diabetes Mellitus II Sister   . Lung cancer      aunts and uncles  . Colon cancer Maternal Aunt     aunts and uncles  . Diabetes Mellitus II Maternal Aunt   . Colon cancer Maternal Uncle   . Diabetes Mellitus II Maternal Uncle   . Diabetes type II Mother     No Known Allergies  Current Outpatient Prescriptions on File Prior to Visit  Medication  Sig Dispense Refill  . albuterol (PROVENTIL HFA;VENTOLIN HFA) 108 (90 Base) MCG/ACT inhaler Inhale 2 puffs into the lungs every 4 (four) hours as needed for wheezing or shortness of breath. 1 each 6  . aspirin-acetaminophen-caffeine (EXCEDRIN EXTRA STRENGTH) 250-250-65 MG per tablet Take 1 tablet by mouth every 6 (six) hours as needed (for migraines).     Marland Kitchen HYDROcodone-homatropine (HYCODAN) 5-1.5 MG/5ML syrup Take 5 mLs by mouth every 8 (eight) hours as needed for cough. 120 mL 0  . hydrOXYzine (ATARAX/VISTARIL) 10 MG tablet Take 1-2 tablets (10-20 mg total) by mouth 3 (three) times daily as needed. 90 tablet 0  . Ibuprofen-Famotidine 800-26.6 MG TABS Take 1 tablet 3 times daily as needed. 270 tablet 1  . levothyroxine (SYNTHROID, LEVOTHROID)  88 MCG tablet Take 1 tablet (88 mcg total) by mouth daily. 90 tablet 1  . losartan-hydrochlorothiazide (HYZAAR) 100-12.5 MG tablet TAKE ONE TABLET BY MOUTH ONCE DAILY 30 tablet 0  . SUMAtriptan (IMITREX) 50 MG tablet Take 1 tablet (50 mg total) by mouth every 2 (two) hours as needed for migraine. May repeat in 2 hours if headache persists or recurs. 10 tablet 3   No current facility-administered medications on file prior to visit.     BP 112/64   Temp 99.6 F (37.6 C) (Oral)   Ht 5\' 7"  (1.702 m)   Wt 165 lb 6.4 oz (75 kg)   BMI 25.91 kg/m       Objective:   Physical Exam  Constitutional: She appears well-developed and well-nourished. No distress.  HENT:  Head: Normocephalic and atraumatic.  Right Ear: Hearing, external ear and ear canal normal. Tympanic membrane is not erythematous and not bulging.  Left Ear: External ear and ear canal normal. Tympanic membrane is not erythematous.  Nose: Rhinorrhea present. No mucosal edema. Right sinus exhibits frontal sinus tenderness. Left sinus exhibits frontal sinus tenderness.  Mouth/Throat: Uvula is midline and mucous membranes are normal. Oropharyngeal exudate, posterior oropharyngeal edema and posterior oropharyngeal erythema present. No tonsillar abscesses.  Hypertrophic tonsils   Eyes: Conjunctivae and EOM are normal. Right eye exhibits no discharge. Left eye exhibits no discharge. No scleral icterus.  Neck: Normal range of motion. Neck supple.  Cardiovascular: Normal rate, regular rhythm, normal heart sounds and intact distal pulses.  Exam reveals no friction rub.   No murmur heard. Pulmonary/Chest: Effort normal and breath sounds normal. No respiratory distress. She has no wheezes. She has no rales.  Lymphadenopathy:    She has cervical adenopathy.  Neurological: She is alert.  Skin: Skin is warm and dry. No rash noted. She is not diaphoretic. No erythema. No pallor.  Psychiatric: She has a normal mood and affect. Her behavior is  normal. Judgment and thought content normal.  Vitals reviewed.     Assessment & Plan:  1. Sore throat - POC Rapid Strep A- negative. I will treat due to presentation and symptoms - Culture, Group A Strep - amoxicillin-clavulanate (AUGMENTIN) 875-125 MG tablet; Take 1 tablet by mouth 2 (two) times daily.  Dispense: 20 tablet; Refill: 0 - magic mouthwash w/lidocaine SOLN; Take 5 mLs by mouth 3 (three) times daily as needed.  Dispense: 180 mL; Refill: 0 - methylPREDNISolone (MEDROL DOSEPAK) 4 MG TBPK tablet; Take as directed  Dispense: 21 tablet; Refill: 0 - Follow up if no improvement 2. Acute frontal sinusitis, recurrence not specified - amoxicillin-clavulanate (AUGMENTIN) 875-125 MG tablet; Take 1 tablet by mouth 2 (two) times daily.  Dispense: 20 tablet; Refill: 0 - Follow  up if no improvement   [name], NP

## 2016-02-23 ENCOUNTER — Telehealth: Payer: Self-pay | Admitting: Adult Health

## 2016-02-23 ENCOUNTER — Other Ambulatory Visit: Payer: Self-pay | Admitting: Adult Health

## 2016-02-23 MED ORDER — HYDROCODONE-HOMATROPINE 5-1.5 MG/5ML PO SYRP: 5.0000 mL | ORAL_SOLUTION | Freq: Three times a day (TID) | ORAL | 0 refills | Status: DC | PRN

## 2016-02-23 NOTE — Telephone Encounter (Signed)
Pt was wondering if you could give her a cough syrup (hydrocodone) pt was coming to [facility] and would like to see if she can pick up the Rx for the cough syrup

## 2016-02-23 NOTE — Telephone Encounter (Signed)
Pt states the meds you gave to her has not stopped her coughing. Pt has coughed all night

## 2016-02-23 NOTE — Telephone Encounter (Signed)
Please advise 

## 2016-02-23 NOTE — Telephone Encounter (Signed)
Per Ed Blalock can be picked up.  Rx printed and signed.  Patient notified and verbalized understanding.

## 2016-02-24 LAB — CULTURE, GROUP A STREP: Organism ID, Bacteria: NORMAL

## 2016-03-19 ENCOUNTER — Encounter: Payer: BLUE CROSS/BLUE SHIELD | Admitting: Adult Health

## 2016-03-19 DIAGNOSIS — Z0289 Encounter for other administrative examinations: Secondary | ICD-10-CM

## 2016-09-10 ENCOUNTER — Encounter: Payer: Self-pay | Admitting: Adult Health

## 2016-09-10 ENCOUNTER — Ambulatory Visit (INDEPENDENT_AMBULATORY_CARE_PROVIDER_SITE_OTHER): Payer: BLUE CROSS/BLUE SHIELD | Admitting: Adult Health

## 2016-09-10 ENCOUNTER — Telehealth: Payer: Self-pay | Admitting: Adult Health

## 2016-09-10 VITALS — BP 138/98 | Ht 67.0 in | Wt 173.8 lb

## 2016-09-10 DIAGNOSIS — I10 Essential (primary) hypertension: Secondary | ICD-10-CM | POA: Diagnosis not present

## 2016-09-10 DIAGNOSIS — J301 Allergic rhinitis due to pollen: Secondary | ICD-10-CM | POA: Diagnosis not present

## 2016-09-10 MED ORDER — LOSARTAN POTASSIUM-HCTZ 100-12.5 MG PO TABS: 1.0000 | ORAL_TABLET | Freq: Every day | ORAL | 0 refills | Status: DC

## 2016-09-10 MED ORDER — HYDROCODONE-HOMATROPINE 5-1.5 MG/5ML PO SYRP: 5.0000 mL | ORAL_SOLUTION | Freq: Three times a day (TID) | ORAL | 0 refills | Status: DC | PRN

## 2016-09-10 NOTE — Telephone Encounter (Signed)
Pt needs new rx losartan -hctz 100-12.5 mg #90 w/refills send to new pharm [facility]

## 2016-09-10 NOTE — Telephone Encounter (Signed)
[name], would you mind scheduling patient for 30 minute follow up? She should be seen for refills on this medication.

## 2016-09-10 NOTE — Telephone Encounter (Signed)
Patient established care with you 02/13/16. This medication was last refilled by Dr. Sherren Mocha on 01/23/16 for #30 with 0 refills.  Is this ok to refill? Or should patient be evaluated?

## 2016-09-10 NOTE — Telephone Encounter (Signed)
She should be seen.

## 2016-09-10 NOTE — Patient Instructions (Addendum)
Follow up for physical   Your blood pressure medication has been sent to the pharmacy

## 2016-09-10 NOTE — Telephone Encounter (Signed)
lmom for pt to callback and sch an appt °

## 2016-09-10 NOTE — Progress Notes (Signed)
Subjective:    Patient ID: [name], female    DOB: 06/14/1965, 51 y.o.   MRN: [id number]  HPI 51 year old female who  has a past medical history of Bell's palsy; Essential hypertension; External hemorrhoids; History of pneumonia; Hyperlipidemia; Hyperthyroidism; Iron deficiency anemia; Migraine headache; PPD positive; Pulmonary nodules; and PVC's (premature ventricular contractions). She presents to the office today for follow up regarding hypertension. She has been out of her medication for one day. She reports that her insurance company will no longer cover her medications that are filled at [facility].   She reports that her blood pressure has been well controlled with this current dose of Hyzaar    Review of Systems See HPI   Past Medical History:  Diagnosis Date  . Bell's palsy    with res left lag  . Essential hypertension   . External hemorrhoids   . History of pneumonia   . Hyperlipidemia   . Hyperthyroidism    hx 51 years old- oral meds  . Iron deficiency anemia   . Migraine headache   . PPD positive   . Pulmonary nodules    not seen on plain cxr, first detected 1/07 re ct 10/08 pos IPPD > 15 mm 12/09 FOB/lavage 04/20/08 neg afb smear  . PVC's (premature ventricular contractions)     Social History   Social History  . Marital status: Divorced    Spouse name: N/A  . Number of children: 2  . 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 Smoking status: Never Smoker  . Smokeless tobacco: Never Used  . Alcohol use No  . Drug use: No  . Sexual activity: Not on file   Other Topics Concern  . Not on file   Social History Narrative   Divorced with children   She works in Press photographer    Possible TB exposure 2008 (after nodules found)   From [place] lived there and [place]   Never owned birds          Past Surgical History:  Procedure Laterality Date  . ABDOMINAL HYSTERECTOMY    .  CHOLECYSTECTOMY  2014  . COLONOSCOPY  05/04/2009   prolapsing external hemorrhoids  . ENDOMETRIAL ABLATION    . HEMORRHOID SURGERY     x 3  . TUBAL LIGATION      Family History  Problem Relation Age of Onset  . Heart disease Maternal Grandmother     aunts, uncles, sister  . Diabetes Mellitus II Maternal Grandmother   . Kidney failure Sister   . Diabetes Mellitus II Sister   . Lung cancer      aunts and uncles  . Colon cancer Maternal Aunt     aunts and uncles  . Diabetes Mellitus II Maternal Aunt   . Colon cancer Maternal Uncle   . Diabetes Mellitus II Maternal Uncle   . Diabetes type II Mother     No Known Allergies  Current Outpatient Prescriptions on File Prior to Visit  Medication Sig Dispense Refill  . albuterol (PROVENTIL HFA;VENTOLIN HFA) 108 (90 Base) MCG/ACT inhaler Inhale 2 puffs into the lungs every 4 (four) hours as needed for wheezing or shortness of breath. 1 each 6  . aspirin-acetaminophen-caffeine (EXCEDRIN EXTRA STRENGTH) 250-250-65 MG per tablet Take 1 tablet by mouth every 6 (six) hours as needed (for migraines).     . hydrOXYzine (ATARAX/VISTARIL) 10 MG tablet Take  1-2 tablets (10-20 mg total) by mouth 3 (three) times daily as needed. 90 tablet 0  . Ibuprofen-Famotidine 800-26.6 MG TABS Take 1 tablet 3 times daily as needed. 270 tablet 1  . levothyroxine (SYNTHROID, LEVOTHROID) 88 MCG tablet Take 1 tablet (88 mcg total) by mouth daily. 90 tablet 1  . SUMAtriptan (IMITREX) 50 MG tablet Take 1 tablet (50 mg total) by mouth every 2 (two) hours as needed for migraine. May repeat in 2 hours if headache persists or recurs. 10 tablet 3   No current facility-administered medications on file prior to visit.     BP (!) 138/98 (BP Location: Left Arm, Patient Position: Sitting, Cuff Size: Normal)   Ht 5\' 7"  (1.702 m)   Wt 173 lb 12.8 oz (78.8 kg)   BMI 27.22 kg/m       Objective:   Physical Exam  Constitutional: She is oriented to person, place, and time. She  appears well-developed and well-nourished. No distress.  Cardiovascular: Normal rate, regular rhythm, normal heart sounds and intact distal pulses.  Exam reveals no gallop and no friction rub.   No murmur heard. Pulmonary/Chest: Effort normal and breath sounds normal. No respiratory distress. She has no wheezes. She has no rales. She exhibits no tenderness.  Musculoskeletal: Normal range of motion. She exhibits no edema, tenderness or deformity.  Neurological: She is alert and oriented to person, place, and time.  Skin: Skin is warm and dry. No rash noted. No erythema. No pallor.  Psychiatric: She has a normal mood and affect. Her behavior is normal. Judgment and thought content normal.  Nursing note and vitals reviewed.     Assessment & Plan:  1. Essential hypertension - She is going to follow up for her CPE next month  - losartan-hydrochlorothiazide (HYZAAR) 100-12.5 MG tablet; Take 1 tablet by mouth daily.  Dispense: 90 tablet; Refill: 0  2. Seasonal allergic rhinitis due to pollen  - HYDROcodone-homatropine (HYCODAN) 5-1.5 MG/5ML syrup; Take 5 mLs by mouth every 8 (eight) hours as needed for cough.  Dispense: 120 mL; Refill: 0   [name], NP

## 2016-09-11 NOTE — Telephone Encounter (Signed)
Pt seen in office on Tuesday 8/8 at 4:45 pm

## 2016-10-08 ENCOUNTER — Encounter: Payer: BLUE CROSS/BLUE SHIELD | Admitting: Adult Health

## 2016-11-05 ENCOUNTER — Other Ambulatory Visit: Payer: Self-pay

## 2016-11-05 MED ORDER — LEVOTHYROXINE SODIUM 88 MCG PO TABS: 88.0000 ug | ORAL_TABLET | Freq: Every day | ORAL | 1 refills | Status: DC

## 2016-11-19 ENCOUNTER — Encounter: Payer: BLUE CROSS/BLUE SHIELD | Admitting: Adult Health

## 2016-11-19 DIAGNOSIS — Z0289 Encounter for other administrative examinations: Secondary | ICD-10-CM

## 2016-11-19 NOTE — Progress Notes (Deleted)
Subjective:    Patient ID: [name], female    DOB: [birth year], 51 y.o.   MRN: [id number]  HPI  Patient presents for yearly preventative medicine examination. She is a pleasant 51 year old female who  has a past medical history of Bell's palsy; Essential hypertension; External hemorrhoids; History of pneumonia; Hyperlipidemia; Hyperthyroidism; Iron deficiency anemia; Migraine headache; PPD positive; Pulmonary nodules; and PVC's (premature ventricular contractions).   All immunizations and health maintenance protocols were reviewed with the patient and needed orders were placed.  Appropriate screening laboratory values were ordered for the patient including screening of hyperlipidemia, renal function and hepatic function. If indicated by BPH, a PSA was ordered.  Medication reconciliation,  past medical history, social history, problem list and allergies were reviewed in detail with the patient  Goals were established with regard to weight loss, exercise, and  diet in compliance with medications. She does not eat a heart healthy diet and she does not exercise.   She takes Hyzaar 100-12.5 mg for management of essential hypertension   She also takes Imitrex as needed for migraines  She takes Synthroid 88 mcg for hypothyroidism   She is up to date on vision and dental screens. She will be due next year for her colonoscopy. She is due in September for mammogram. She has had a hysterectomy.   Review of Systems  Constitutional: Negative.   HENT: Negative.   Eyes: Negative.   Respiratory: Negative.   Cardiovascular: Negative.   Gastrointestinal: Negative.   Endocrine: Negative.   Genitourinary: Negative.   Musculoskeletal: Negative.   Skin: Negative.   Allergic/Immunologic: Negative.   Neurological: Negative.   Hematological: Negative.   Psychiatric/Behavioral: Negative.   All other systems reviewed and are negative.  Past Medical History:  Diagnosis Date  . Bell's palsy     with res left lag  . Essential hypertension   . External hemorrhoids   . History of pneumonia   . Hyperlipidemia   . Hyperthyroidism    hx 51 years old- oral meds  . Iron deficiency anemia   . Migraine headache   . PPD positive   . Pulmonary nodules    not seen on plain cxr, first detected 1/07 re ct 10/08 pos IPPD > 15 mm 12/09 FOB/lavage 04/20/08 neg afb smear  . PVC's (premature ventricular contractions)     Social History   Social History  . Marital status: Divorced    Spouse name: N/A  . Number of children: 2  . 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 Smoking status: Never Smoker  . Smokeless tobacco: Never Used  . Alcohol use No  . Drug use: No  . Sexual activity: Not on file   Other Topics Concern  . Not on file   Social History Narrative   Divorced with children   She works in Press photographer    Possible TB exposure 2008 (after nodules found)   From [place] lived there and [place]   Never owned birds          Past Surgical History:  Procedure Laterality Date  . ABDOMINAL HYSTERECTOMY    . CHOLECYSTECTOMY  2014  . COLONOSCOPY  05/04/2009   prolapsing external hemorrhoids  . ENDOMETRIAL ABLATION    . HEMORRHOID SURGERY     x 3  . TUBAL LIGATION      Family History  Problem Relation Age of Onset  .  Heart disease Maternal Grandmother        aunts, uncles, sister  . Diabetes Mellitus II Maternal Grandmother   . Kidney failure Sister   . Diabetes Mellitus II Sister   . Lung cancer Unknown        aunts and uncles  . Colon cancer Maternal Aunt        aunts and uncles  . Diabetes Mellitus II Maternal Aunt   . Colon cancer Maternal Uncle   . Diabetes Mellitus II Maternal Uncle   . Diabetes type II Mother     No Known Allergies  Current Outpatient Prescriptions on File Prior to Visit  Medication Sig Dispense Refill  . albuterol (PROVENTIL HFA;VENTOLIN HFA) 108 (90 Base) MCG/ACT inhaler  Inhale 2 puffs into the lungs every 4 (four) hours as needed for wheezing or shortness of breath. 1 each 6  . aspirin-acetaminophen-caffeine (EXCEDRIN EXTRA STRENGTH) 250-250-65 MG per tablet Take 1 tablet by mouth every 6 (six) hours as needed (for migraines).     Marland Kitchen HYDROcodone-homatropine (HYCODAN) 5-1.5 MG/5ML syrup Take 5 mLs by mouth every 8 (eight) hours as needed for cough. 120 mL 0  . hydrOXYzine (ATARAX/VISTARIL) 10 MG tablet Take 1-2 tablets (10-20 mg total) by mouth 3 (three) times daily as needed. 90 tablet 0  . Ibuprofen-Famotidine 800-26.6 MG TABS Take 1 tablet 3 times daily as needed. 270 tablet 1  . levothyroxine (SYNTHROID, LEVOTHROID) 88 MCG tablet Take 1 tablet (88 mcg total) by mouth daily. 90 tablet 1  . losartan-hydrochlorothiazide (HYZAAR) 100-12.5 MG tablet Take 1 tablet by mouth daily. 90 tablet 0  . SUMAtriptan (IMITREX) 50 MG tablet Take 1 tablet (50 mg total) by mouth every 2 (two) hours as needed for migraine. May repeat in 2 hours if headache persists or recurs. 10 tablet 3   No current facility-administered medications on file prior to visit.     There were no vitals taken for this visit.      Objective:   Physical Exam  Constitutional: She is oriented to person, place, and time. She appears well-developed and well-nourished. No distress.  HENT:  Head: Normocephalic and atraumatic.  Right Ear: External ear normal.  Left Ear: External ear normal.  Nose: Nose normal.  Mouth/Throat: Oropharynx is clear and moist. No oropharyngeal exudate.  Eyes: Pupils are equal, round, and reactive to light. Conjunctivae and EOM are normal. Right eye exhibits no discharge. Left eye exhibits no discharge. No scleral icterus.  Neck: Normal range of motion. Neck supple. No JVD present. No tracheal deviation present. No thyromegaly present.  Cardiovascular: Normal rate, regular rhythm, normal heart sounds and intact distal pulses.  Exam reveals no gallop and no friction rub.   No  murmur heard. Pulmonary/Chest: Effort normal and breath sounds normal. No stridor. No respiratory distress. She has no wheezes. She has no rales. She exhibits no tenderness.  Abdominal: Soft. Bowel sounds are normal. She exhibits no distension and no mass. There is no tenderness. There is no rebound and no guarding.  Musculoskeletal: Normal range of motion. She exhibits no edema, tenderness or deformity.  Lymphadenopathy:    She has no cervical adenopathy.  Neurological: She is alert and oriented to person, place, and time. She has normal reflexes. She displays normal reflexes. No cranial nerve deficit. She exhibits normal muscle tone. Coordination normal.  Skin: Skin is warm and dry. No rash noted. No erythema. No pallor.  Psychiatric: She has a normal mood and affect. Her behavior is normal. Judgment and  thought content normal.  Nursing note and vitals reviewed.     Assessment & Plan:

## 2016-12-06 ENCOUNTER — Telehealth: Payer: Self-pay | Admitting: Family Medicine

## 2016-12-06 ENCOUNTER — Other Ambulatory Visit: Payer: Self-pay | Admitting: Adult Health

## 2016-12-06 DIAGNOSIS — I10 Essential (primary) hypertension: Secondary | ICD-10-CM

## 2016-12-06 NOTE — Telephone Encounter (Signed)
Pt was due for her cpx in June with [facility]  Please help her to get on the schedule.  Have her come fasting for her appointment for lab work.

## 2016-12-06 NOTE — Telephone Encounter (Signed)
Sent to the pharmacy for 90 days.  Pt now due for cpx with [facility].  Message sent to scheduling.

## 2016-12-16 NOTE — Telephone Encounter (Signed)
Lm on VM to call back 

## 2016-12-18 ENCOUNTER — Other Ambulatory Visit: Payer: Self-pay | Admitting: Family Medicine

## 2016-12-18 ENCOUNTER — Other Ambulatory Visit: Payer: Self-pay | Admitting: Adult Health

## 2016-12-18 DIAGNOSIS — Z1231 Encounter for screening mammogram for malignant neoplasm of breast: Secondary | ICD-10-CM

## 2016-12-19 NOTE — Telephone Encounter (Signed)
Pt called and will call back when ready to schedule

## 2016-12-19 NOTE — Telephone Encounter (Signed)
Lm on vm to call the office

## 2017-01-08 NOTE — Telephone Encounter (Signed)
Pt has been scheduled.  °

## 2017-01-13 ENCOUNTER — Ambulatory Visit
Admission: RE | Admit: 2017-01-13 | Discharge: 2017-01-13 | Disposition: A | Discharge status: Home / Self Care | Payer: BLUE CROSS/BLUE SHIELD | Source: Ambulatory Visit | Attending: Adult Health | Admitting: Adult Health

## 2017-01-13 DIAGNOSIS — Z1231 Encounter for screening mammogram for malignant neoplasm of breast: Secondary | ICD-10-CM

## 2017-01-20 ENCOUNTER — Ambulatory Visit (INDEPENDENT_AMBULATORY_CARE_PROVIDER_SITE_OTHER): Payer: BLUE CROSS/BLUE SHIELD | Admitting: Adult Health

## 2017-01-20 ENCOUNTER — Encounter: Payer: Self-pay | Admitting: Adult Health

## 2017-01-20 VITALS — BP 134/84 | Temp 97.8°F | Ht 66.0 in | Wt 177.0 lb

## 2017-01-20 DIAGNOSIS — Z0000 Encounter for general adult medical examination without abnormal findings: Secondary | ICD-10-CM

## 2017-01-20 DIAGNOSIS — G43719 Chronic migraine without aura, intractable, without status migrainosus: Secondary | ICD-10-CM | POA: Diagnosis not present

## 2017-01-20 DIAGNOSIS — Z87898 Personal history of other specified conditions: Secondary | ICD-10-CM

## 2017-01-20 DIAGNOSIS — E782 Mixed hyperlipidemia: Secondary | ICD-10-CM

## 2017-01-20 DIAGNOSIS — E039 Hypothyroidism, unspecified: Secondary | ICD-10-CM

## 2017-01-20 DIAGNOSIS — I10 Essential (primary) hypertension: Secondary | ICD-10-CM

## 2017-01-20 DIAGNOSIS — G4730 Sleep apnea, unspecified: Secondary | ICD-10-CM | POA: Diagnosis not present

## 2017-01-20 LAB — CBC WITH DIFFERENTIAL/PLATELET
BASOS ABS: 0.1 10*3/uL (ref 0.0–0.1)
Basophils Relative: 0.9 % (ref 0.0–3.0)
Eosinophils Absolute: 0.3 10*3/uL (ref 0.0–0.7)
Eosinophils Relative: 4.7 % (ref 0.0–5.0)
HCT: 39.3 % (ref 36.0–46.0)
Hemoglobin: 13.2 g/dL (ref 12.0–15.0)
LYMPHS ABS: 2.1 10*3/uL (ref 0.7–4.0)
Lymphocytes Relative: 35.6 % (ref 12.0–46.0)
MCHC: 33.7 g/dL (ref 30.0–36.0)
MCV: 91.1 fl (ref 78.0–100.0)
MONO ABS: 0.3 10*3/uL (ref 0.1–1.0)
Monocytes Relative: 6 % (ref 3.0–12.0)
NEUTROS PCT: 52.8 % (ref 43.0–77.0)
Neutro Abs: 3.1 10*3/uL (ref 1.4–7.7)
PLATELETS: 260 10*3/uL (ref 150.0–400.0)
RBC: 4.31 Mil/uL (ref 3.87–5.11)
RDW: 13.8 % (ref 11.5–15.5)
WBC: 5.8 10*3/uL (ref 4.0–10.5)

## 2017-01-20 LAB — T4, FREE: Free T4: 0.78 ng/dL (ref 0.60–1.60)

## 2017-01-20 LAB — LIPID PANEL
Cholesterol: 234 mg/dL — ABNORMAL HIGH (ref 0–200)
HDL: 34.4 mg/dL — AB (ref >39.00)
LDL Cholesterol: 179 mg/dL — ABNORMAL HIGH (ref 0–99)
NonHDL: 199.91
TRIGLYCERIDES: 107 mg/dL (ref 0.0–149.0)
Total CHOL/HDL Ratio: 7
VLDL: 21.4 mg/dL (ref 0.0–40.0)

## 2017-01-20 LAB — BASIC METABOLIC PANEL
BUN: 10 mg/dL (ref 6–23)
CALCIUM: 9.1 mg/dL (ref 8.4–10.5)
CO2: 28 mEq/L (ref 19–32)
CREATININE: 0.93 mg/dL (ref 0.40–1.20)
Chloride: 106 mEq/L (ref 96–112)
GFR: 82.32 mL/min (ref >60.00)
GLUCOSE: 106 mg/dL — AB (ref 70–99)
Potassium: 3.6 mEq/L (ref 3.5–5.1)
Sodium: 141 mEq/L (ref 135–145)

## 2017-01-20 LAB — T3, FREE: T3 FREE: 3.7 pg/mL (ref 2.3–4.2)

## 2017-01-20 LAB — HEPATIC FUNCTION PANEL
ALBUMIN: 4.1 g/dL (ref 3.5–5.2)
ALK PHOS: 82 U/L (ref 39–117)
ALT: 14 U/L (ref 0–35)
AST: 16 U/L (ref 0–37)
BILIRUBIN DIRECT: 0.1 mg/dL (ref 0.0–0.3)
Total Bilirubin: 0.8 mg/dL (ref 0.2–1.2)
Total Protein: 6.7 g/dL (ref 6.0–8.3)

## 2017-01-20 LAB — TSH: TSH: 6.32 u[IU]/mL — ABNORMAL HIGH (ref 0.35–4.50)

## 2017-01-20 LAB — HEMOGLOBIN A1C: HEMOGLOBIN A1C: 5.3 % (ref 4.6–6.5)

## 2017-01-20 MED ORDER — LOSARTAN POTASSIUM-HCTZ 100-12.5 MG PO TABS: 1.0000 | ORAL_TABLET | Freq: Every day | ORAL | 3 refills | Status: DC

## 2017-01-20 NOTE — Addendum Note (Signed)
Addended by: Tomi Likens on: 01/20/2017 08:37 AM   Modules accepted: Orders

## 2017-01-20 NOTE — Patient Instructions (Signed)
It was great seeing you today   I will follow up with you regarding your blood work   Someone form pulmonary and for the CT will call you to schedule these appointment   Please work on diet and exercise

## 2017-01-20 NOTE — Progress Notes (Addendum)
Subjective:    Patient ID: [name], female    DOB: [birth year], 51 y.o.   MRN: [id number]  HPI  Patient presents for yearly preventative medicine examination. She is a pleasant 51 year old female who  has a past medical history of Bell's palsy; Essential hypertension; External hemorrhoids; History of pneumonia; Hyperlipidemia; Hyperthyroidism; Iron deficiency anemia; Migraine headache; PPD positive; Pulmonary nodules; and PVC's (premature ventricular contractions).  She takes Synthroid 88 mcg for hypothyroidism. She feels as though her thyroid is "out of wack." She has been feeling more fatigued, irritable, and she has been "stress eating".   She takes Hyzaar 100-12.5 mg for control of hypertension. She has not been controlled in the past.  She takes Imitrex as needed for migraines.    All immunizations and health maintenance protocols were reviewed with the patient and needed orders were placed. She is up to date on vaccinations   Appropriate screening laboratory values were ordered for the patient including screening of hyperlipidemia, renal function and hepatic function.  Medication reconciliation,  past medical history, social history, problem list and allergies were reviewed in detail with the patient  Goals were established with regard to weight loss, exercise, and  diet in compliance with medications. She reports that she is not exercising and her diet is poor   She is due for a colonoscopy next year. She had a mammogram in September 2018, which was normal. She has a history of hysterectomy, so she no longer has a PAP. She participates in routine dental and vision screens   She is concerned that over the last six months she has been snoring " a lot more" and she has been waking up in the middle of the night " gasping for breath". She is wondering if she has sleep apnea. She believes that this may be contributing to her fatigue   She is also concerned due to history of  pulmonary nodules. Her last CT of the chest showed multiple stable pulmonary nodules, this was in 2010 and was a follow up from 2008.      Review of Systems  Constitutional: Positive for fatigue.  HENT: Negative.   Eyes: Negative.   Respiratory: Negative.   Cardiovascular: Negative.   Gastrointestinal: Negative.   Endocrine: Negative.   Genitourinary: Negative.   Musculoskeletal: Negative.   Skin: Negative.   Allergic/Immunologic: Negative.   Neurological: Positive for headaches.  Hematological: Negative.   Psychiatric/Behavioral: Positive for sleep disturbance.  All other systems reviewed and are negative.  Past Medical History:  Diagnosis Date  . Bell's palsy    with res left lag  . Essential hypertension   . External hemorrhoids   . History of pneumonia   . Hyperlipidemia   . Hyperthyroidism    hx 51 years old- oral meds  . Iron deficiency anemia   . Migraine headache   . PPD positive   . Pulmonary nodules    not seen on plain cxr, first detected 1/07 re ct 10/08 pos IPPD > 15 mm 12/09 FOB/lavage 04/20/08 neg afb smear  . PVC's (premature ventricular contractions)     Social History   Social History  . Marital status: Divorced    Spouse name: N/A  . Number of children: 2  . 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 Smoking status: Never Smoker  . Smokeless tobacco: Never Used  . Alcohol use No  .  Drug use: No  . Sexual activity: Not on file   Other Topics Concern  . Not on file   Social History Narrative   Divorced with children   She works in Press photographer    Possible TB exposure 2008 (after nodules found)   From [place] lived there and [place]   Never owned birds          Past Surgical History:  Procedure Laterality Date  . ABDOMINAL HYSTERECTOMY    . BREAST BIOPSY Right   . CHOLECYSTECTOMY  2014  . COLONOSCOPY  05/04/2009   prolapsing external hemorrhoids  . ENDOMETRIAL ABLATION      . HEMORRHOID SURGERY     x 3  . TUBAL LIGATION      Family History  Problem Relation Age of Onset  . Heart disease Maternal Grandmother        aunts, uncles, sister  . Diabetes Mellitus II Maternal Grandmother   . Kidney failure Sister   . Diabetes Mellitus II Sister   . Lung cancer Unknown        aunts and uncles  . Colon cancer Maternal Aunt        aunts and uncles  . Diabetes Mellitus II Maternal Aunt   . Colon cancer Maternal Uncle   . Diabetes Mellitus II Maternal Uncle   . Diabetes type II Mother     No Known Allergies  Current Outpatient Prescriptions on File Prior to Visit  Medication Sig Dispense Refill  . aspirin-acetaminophen-caffeine (EXCEDRIN EXTRA STRENGTH) 250-250-65 MG per tablet Take 1 tablet by mouth every 6 (six) hours as needed (for migraines).     Marland Kitchen HYDROcodone-homatropine (HYCODAN) 5-1.5 MG/5ML syrup Take 5 mLs by mouth every 8 (eight) hours as needed for cough. 120 mL 0  . Ibuprofen-Famotidine 800-26.6 MG TABS Take 1 tablet 3 times daily as needed. 270 tablet 1  . levothyroxine (SYNTHROID, LEVOTHROID) 88 MCG tablet Take 1 tablet (88 mcg total) by mouth daily. 90 tablet 1  . SUMAtriptan (IMITREX) 50 MG tablet Take 1 tablet (50 mg total) by mouth every 2 (two) hours as needed for migraine. May repeat in 2 hours if headache persists or recurs. 10 tablet 3   No current facility-administered medications on file prior to visit.     BP 134/84 (BP Location: Right Arm)   Temp 97.8 F (36.6 C) (Oral)   Ht 5\' 6"  (1.676 m)   Wt 177 lb (80.3 kg)   BMI 28.57 kg/m       Objective:   Physical Exam  Constitutional: She is oriented to person, place, and time. She appears well-developed and well-nourished. No distress.  HENT:  Head: Normocephalic and atraumatic.  Right Ear: External ear normal.  Left Ear: External ear normal.  Nose: Nose normal.  Mouth/Throat: Oropharynx is clear and moist. No oropharyngeal exudate.  Eyes: Pupils are equal, round, and  reactive to light. Conjunctivae and EOM are normal. Right eye exhibits no discharge. Left eye exhibits no discharge. No scleral icterus.  Neck: Normal range of motion. Neck supple. No JVD present. Carotid bruit is not present. No tracheal deviation present. No thyroid mass and no thyromegaly present.  Cardiovascular: Normal rate, regular rhythm, normal heart sounds and intact distal pulses.  Exam reveals no gallop and no friction rub.   No murmur heard. Pulmonary/Chest: Effort normal and breath sounds normal. No stridor. No respiratory distress. She has no wheezes. She has no rales. She exhibits no tenderness.  Abdominal: Soft. Bowel sounds are normal.  She exhibits no distension and no mass. There is no tenderness. There is no rebound and no guarding.  Overweight    Genitourinary:  Genitourinary Comments: Refused   Musculoskeletal: Normal range of motion. She exhibits no edema, tenderness or deformity.  Lymphadenopathy:    She has no cervical adenopathy.  Neurological: She is alert and oriented to person, place, and time. She displays normal reflexes. No cranial nerve deficit. She exhibits normal muscle tone. Coordination normal.  Skin: Skin is warm and dry. No rash noted. She is not diaphoretic. No erythema. No pallor.  Psychiatric: She has a normal mood and affect. Her behavior is normal. Judgment and thought content normal.  Nursing note and vitals reviewed.     Assessment & Plan:  1. Routine general medical examination at a health care facility - Follow up in one year  - Educated on the importance of diet and exercise  - Basic metabolic panel; Future - CBC with Differential/Platelet; Future - Hemoglobin A1c; Future - Hepatic function panel; Future - Lipid panel; Future - Basic metabolic panel - CBC with Differential/Platelet - Hemoglobin A1c - Hepatic function panel - Lipid panel  2. Mixed hyperlipidemia  - Basic metabolic panel; Future - CBC with Differential/Platelet;  Future - Hemoglobin A1c; Future - Hepatic function panel; Future - Lipid panel; Future - Basic metabolic panel - CBC with Differential/Platelet - Hemoglobin A1c - Hepatic function panel - Lipid panel  3. Hypothyroidism, unspecified type - Consider dose change of synthroid  - Basic metabolic panel; Future - CBC with Differential/Platelet; Future - Hemoglobin A1c; Future - Hepatic function panel; Future - Lipid panel; Future - TSH; Future - T4, Free; Future - T3, Free; Future   4. Essential hypertension - Near goal  - Needs to work on weight loss through diet and exercise  - Basic metabolic panel; Future - CBC with Differential/Platelet; Future - Hemoglobin A1c; Future - Hepatic function panel; Future - Lipid panel; Future - losartan-hydrochlorothiazide (HYZAAR) 100-12.5 MG tablet; Take 1 tablet by mouth daily.  Dispense: 90 tablet; Refill: 3   5. Intractable chronic migraine without aura and without status migrainosus - Controlled with Imitrex as needed  6. Hx of multiple pulmonary nodules  - CT Chest W Contrast; Future  7. Sleep apnea, unspecified type  - Ambulatory referral to Pulmonology   [name], NP

## 2017-01-21 ENCOUNTER — Other Ambulatory Visit: Payer: Self-pay | Admitting: Family Medicine

## 2017-01-21 DIAGNOSIS — I10 Essential (primary) hypertension: Secondary | ICD-10-CM

## 2017-01-21 MED ORDER — LEVOTHYROXINE SODIUM 100 MCG PO TABS
100.0000 ug | ORAL_TABLET | Freq: Every day | ORAL | 3 refills | Status: DC
Start: 2017-01-21 — End: 2018-04-27

## 2017-01-21 MED ORDER — LOSARTAN POTASSIUM-HCTZ 100-12.5 MG PO TABS: 1.0000 | ORAL_TABLET | Freq: Every day | ORAL | 3 refills | Status: DC

## 2017-01-24 ENCOUNTER — Encounter: Payer: Self-pay | Admitting: Adult Health

## 2017-01-27 DIAGNOSIS — R102 Pelvic and perineal pain: Secondary | ICD-10-CM | POA: Diagnosis not present

## 2017-01-27 DIAGNOSIS — Z01419 Encounter for gynecological examination (general) (routine) without abnormal findings: Secondary | ICD-10-CM | POA: Diagnosis not present

## 2017-01-28 ENCOUNTER — Ambulatory Visit (INDEPENDENT_AMBULATORY_CARE_PROVIDER_SITE_OTHER)
Admission: RE | Admit: 2017-01-28 | Discharge: 2017-01-28 | Disposition: A | Discharge status: Home / Self Care | Payer: BLUE CROSS/BLUE SHIELD | Source: Ambulatory Visit | Attending: Adult Health | Admitting: Adult Health

## 2017-01-28 DIAGNOSIS — Z87898 Personal history of other specified conditions: Secondary | ICD-10-CM

## 2017-01-28 DIAGNOSIS — R918 Other nonspecific abnormal finding of lung field: Secondary | ICD-10-CM | POA: Diagnosis not present

## 2017-01-28 MED ORDER — IOPAMIDOL (ISOVUE-300) INJECTION 61%
80.0000 mL | Freq: Once | INTRAVENOUS | Status: AC | PRN
  Administered 2017-01-28: 80 mL via INTRAVENOUS

## 2017-01-30 ENCOUNTER — Telehealth: Payer: Self-pay | Admitting: Adult Health

## 2017-01-30 NOTE — Telephone Encounter (Signed)
Pt would like results of CT done.

## 2017-01-30 NOTE — Telephone Encounter (Signed)
Pt notified of CT result by telephone.

## 2017-02-04 ENCOUNTER — Institutional Professional Consult (permissible substitution): Payer: BLUE CROSS/BLUE SHIELD | Admitting: Pulmonary Disease

## 2017-02-12 ENCOUNTER — Other Ambulatory Visit: Payer: Self-pay | Admitting: Family Medicine

## 2017-02-12 DIAGNOSIS — E039 Hypothyroidism, unspecified: Secondary | ICD-10-CM

## 2017-02-17 ENCOUNTER — Other Ambulatory Visit: Payer: BLUE CROSS/BLUE SHIELD

## 2017-02-27 ENCOUNTER — Other Ambulatory Visit (INDEPENDENT_AMBULATORY_CARE_PROVIDER_SITE_OTHER): Payer: BLUE CROSS/BLUE SHIELD

## 2017-02-27 DIAGNOSIS — E039 Hypothyroidism, unspecified: Secondary | ICD-10-CM | POA: Diagnosis not present

## 2017-02-28 LAB — TSH: TSH: 2.16 u[IU]/mL (ref 0.35–4.50)

## 2017-03-03 ENCOUNTER — Encounter: Payer: Self-pay | Admitting: Pulmonary Disease

## 2017-03-03 ENCOUNTER — Ambulatory Visit (INDEPENDENT_AMBULATORY_CARE_PROVIDER_SITE_OTHER): Payer: BLUE CROSS/BLUE SHIELD | Admitting: Pulmonary Disease

## 2017-03-03 VITALS — BP 118/80 | HR 85 | Ht 66.0 in | Wt 174.0 lb

## 2017-03-03 DIAGNOSIS — R0683 Snoring: Secondary | ICD-10-CM

## 2017-03-03 DIAGNOSIS — G4733 Obstructive sleep apnea (adult) (pediatric): Secondary | ICD-10-CM | POA: Diagnosis not present

## 2017-03-03 NOTE — Assessment & Plan Note (Signed)
Given excessive daytime somnolence, narrow pharyngeal exam, non refreshing sleep & loud snoring, obstructive sleep apnea is vprobable & an overnight polysomnogram will be scheduled as a split study. The pathophysiology of obstructive sleep apnea , it's cardiovascular consequences & modes of treatment including CPAP were discused with the patient in detail & they evidenced understanding.  Pretest probability is intermediate. If simple snoring, will use nasal steroids

## 2017-03-03 NOTE — Patient Instructions (Signed)
Home sleep study will be scheduled

## 2017-03-03 NOTE — Assessment & Plan Note (Signed)
Calcified granuloma right lower lobe, no further follow-up required

## 2017-03-03 NOTE — Progress Notes (Signed)
Subjective:    Patient ID: [name], female    DOB: 1966/02/29, 51 y.o.   MRN: [id number]  HPI  Chief Complaint  Patient presents with  . Sleep Consult    Per patient, she snores a lot at night. Feels like she is not resting well. Denies ever having a SS done.     51 year old presents for evaluation of sleep disordered breathing. Family members and friends are reported loud snoring for many months.  She has tried no strips with not great results.  She also reports non-refreshing sleep and excessive daytime tiredness. Epworth sleepiness score is 10. Bedtime is between 10 and 11 PM, sleep latency is minimal, she sleeps on her side with 2 pillows and rolls over on her back sometime through the night, reports 4-5 nocturnal awakenings including nocturia and is out of bed by 7:30 AM feeling tired with dryness of mouth and occasional headache.  On weekends she stays in bed until 9 AM.  There is no history suggestive of cataplexy, sleep paralysis or parasomnias   She denies drinking tea or coffee but drinks 2-3 [facility] every day.  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  She reports 20 pound weight gain over the past 4 years.  She has been evaluated in the past for pulmonary nodules and underwent a bronchoscopy. CT chest 01/2017 was reviewed which shows calcified right lower lobe granuloma which is increased in size from 6-9 mm     Past Medical History:  Diagnosis Date  . Bell's palsy    with res left lag  . Essential hypertension   . External hemorrhoids   . History of pneumonia   . Hyperlipidemia   . Hyperthyroidism    hx 51 years old- oral meds  . Iron deficiency anemia   . Migraine headache   . PPD positive   . Pulmonary nodules    not seen on plain cxr, first detected 1/07 re ct 10/08 pos IPPD > 15 mm 12/09 FOB/lavage 04/20/08 neg afb smear  . PVC's (premature ventricular contractions)      Past Surgical History:  Procedure Laterality  Date  . ABDOMINAL HYSTERECTOMY    . BREAST BIOPSY Right   . CHOLECYSTECTOMY  2014  . COLONOSCOPY  05/04/2009   prolapsing external hemorrhoids  . ENDOMETRIAL ABLATION    . HEMORRHOID SURGERY     x 3  . TUBAL LIGATION      No Known Allergies  Social History   Social History  . Marital status: Divorced    Spouse name: N/A  . Number of children: 2  . 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 Smoking status: Never Smoker  . Smokeless tobacco: Never Used  . Alcohol use No  . Drug use: No  . Sexual activity: Not on file   Other Topics Concern  . Not on file   Social History Narrative   Divorced with children   She works in Press photographer    Possible TB exposure 2008 (after nodules found)   From [place] lived there and [place]   Never owned birds            Family History  Problem Relation Age of Onset  . Heart disease Maternal Grandmother        aunts, uncles, sister  . Diabetes Mellitus II Maternal Grandmother   . Kidney failure Sister   .  Diabetes Mellitus II Sister   . Lung cancer Unknown        aunts and uncles  . Colon cancer Maternal Aunt        aunts and uncles  . Diabetes Mellitus II Maternal Aunt   . Colon cancer Maternal Uncle   . Diabetes Mellitus II Maternal Uncle   . Diabetes type II Mother      Review of Systems  Positive for weight gain, headaches, anxiety    Constitutional: negative for anorexia, fevers and sweats  Eyes: negative for irritation, redness and visual disturbance  Ears, nose, mouth, throat, and face: negative for earaches, epistaxis, nasal congestion and sore throat  Respiratory: negative for cough, dyspnea on exertion, sputum and wheezing  Cardiovascular: negative for chest pain, dyspnea, lower extremity edema, orthopnea, palpitations and syncope  Gastrointestinal: negative for abdominal pain, constipation, diarrhea, melena, nausea and vomiting  Genitourinary:negative  for dysuria, frequency and hematuria  Hematologic/lymphatic: negative for bleeding, easy bruising and lymphadenopathy  Musculoskeletal:negative for arthralgias, muscle weakness and stiff joints  Neurological: negative for coordination problems, gait problems, headaches and weakness  Endocrine: negative for diabetic symptoms including polydipsia, polyuria and weight loss  Objective:   Physical Exam  Gen. Pleasant, well-nourished, in no distress ENT -large nasal turbinates, no post nasal drip Neck: No JVD, no thyromegaly, no carotid bruits Lungs: no use of accessory muscles, no dullness to percussion, clear without rales or rhonchi  Cardiovascular: Rhythm regular, heart sounds  normal, no murmurs or gallops, no peripheral edema Musculoskeletal: No deformities, no cyanosis or clubbing        Assessment & Plan:

## 2017-03-04 ENCOUNTER — Telehealth: Payer: Self-pay | Admitting: Family Medicine

## 2017-03-04 NOTE — Telephone Encounter (Signed)
Copied from [place] [id number]. Topic: Quick Communication - Lab Results >> Feb 28, 2017  2:32 PM Scherrie Gerlach wrote: Pt calling for results of lab done yesterday

## 2017-03-04 NOTE — Telephone Encounter (Signed)
Message  Received: Yesterday  Message Contents  Shirley Muscat T, CMA          Pt called about her Thyroid test and I saw where [name] said her Thyroid is now normal so I gave her the information    Malachy Mood

## 2017-03-17 DIAGNOSIS — R102 Pelvic and perineal pain: Secondary | ICD-10-CM | POA: Diagnosis not present

## 2017-03-23 DIAGNOSIS — G4733 Obstructive sleep apnea (adult) (pediatric): Secondary | ICD-10-CM | POA: Diagnosis not present

## 2017-03-24 DIAGNOSIS — G4733 Obstructive sleep apnea (adult) (pediatric): Secondary | ICD-10-CM | POA: Diagnosis not present

## 2017-04-01 ENCOUNTER — Telehealth: Payer: Self-pay | Admitting: Pulmonary Disease

## 2017-04-01 NOTE — Telephone Encounter (Signed)
Per RA, HST shows mild OSA, worse in supine position. 12 events per hour.   Wishes to proceed with a trial of auto cpap 5-12cm, nasal pillows. OV in 4 weeks after starting CPAP therapy.

## 2017-04-04 ENCOUNTER — Other Ambulatory Visit: Payer: Self-pay | Admitting: *Deleted

## 2017-04-04 DIAGNOSIS — G4733 Obstructive sleep apnea (adult) (pediatric): Secondary | ICD-10-CM

## 2017-04-16 ENCOUNTER — Telehealth: Payer: Self-pay | Admitting: Pulmonary Disease

## 2017-04-16 NOTE — Telephone Encounter (Signed)
[name], [place]     04/01/17 2:40 PM  Note    Per RA, HST shows mild OSA, worse in supine position. 12 events per hour.   Wishes to proceed with a trial of auto cpap 5-12cm, nasal pillows. OV in 4 weeks after starting CPAP therapy.      Advised pt of results. Pt understood and nothing further is needed.   She made an appt with RA to discuss further.

## 2017-04-21 ENCOUNTER — Telehealth: Payer: Self-pay | Admitting: Family Medicine

## 2017-04-21 NOTE — Telephone Encounter (Signed)
Copied from [place] [id number]. Topic: General - Other >> Apr 21, 2017 10:27 AM Marin Olp L wrote: Reason for CRM:  losartan-hydrochlorothiazide (HYZAAR) 100-12.5 MG tablet has been recalled? Please call to let her know if it's safe to take.  This is a duplicate note, please see previous note.

## 2017-04-21 NOTE — Telephone Encounter (Signed)
Called and spoke to the pt.  Advised that she call her pharmacy to see if her lot # matches those that are affected.  Pt will call back if needed.  Will now close note.  No further action needed.

## 2017-04-30 ENCOUNTER — Telehealth: Payer: Self-pay | Admitting: Family Medicine

## 2017-04-30 NOTE — Telephone Encounter (Signed)
Copied from [place] [id number]. Topic: General - Other >> Apr 30, 2017  9:58 AM Lolita Rieger, RMA wrote: Reason for CRM: pt would like refill on hycodan for cough called into her pharmacy [facility] [place]

## 2017-04-30 NOTE — Telephone Encounter (Signed)
Spoke to the pt.  Advised that she would need to come and be seen in the office.  Hycodan is a controlled substance.  Also needs to be evaluated for other medication if needed.  Pt agreed and placed on the schedule for 05/01/17 @ 7:30 AM.  Sx include Cough, ST, Hoarseness.  She denied fever and SOB.

## 2017-05-01 ENCOUNTER — Encounter: Payer: Self-pay | Admitting: Adult Health

## 2017-05-01 ENCOUNTER — Ambulatory Visit (INDEPENDENT_AMBULATORY_CARE_PROVIDER_SITE_OTHER): Payer: BLUE CROSS/BLUE SHIELD | Admitting: Adult Health

## 2017-05-01 VITALS — BP 140/86 | Temp 98.3°F | Wt 180.0 lb

## 2017-05-01 DIAGNOSIS — J069 Acute upper respiratory infection, unspecified: Secondary | ICD-10-CM

## 2017-05-01 MED ORDER — HYDROCODONE-HOMATROPINE 5-1.5 MG/5ML PO SYRP: 5.0000 mL | ORAL_SOLUTION | Freq: Three times a day (TID) | ORAL | 0 refills | Status: DC | PRN

## 2017-05-01 MED ORDER — BENZONATATE 200 MG PO CAPS: 200.0000 mg | ORAL_CAPSULE | Freq: Two times a day (BID) | ORAL | 1 refills | Status: DC | PRN

## 2017-05-01 NOTE — Progress Notes (Signed)
Subjective:    Patient ID: [name], female    DOB: Jul 09, 1965, 51 y.o.   MRN: [id number]  HPI  51 year old female who  has a past medical history of Bell's palsy, Essential hypertension, External hemorrhoids, History of pneumonia, Hyperlipidemia, Hyperthyroidism, Iron deficiency anemia, Migraine headache, PPD positive, Pulmonary nodules, and PVC's (premature ventricular contractions). She presents to the office today for the acute complaint of non productive cough, sore throat and hoarse voice. She denies fever or shortness of breath. She feels as though her symptoms are slowly improving.   She has been using Hycodan cough syrup but has run out. She is unable to sleep due to the cough.    Review of Systems See HPI   Past Medical History:  Diagnosis Date  . Bell's palsy    with res left lag  . Essential hypertension   . External hemorrhoids   . History of pneumonia   . Hyperlipidemia   . Hyperthyroidism    hx 51 years old- oral meds  . Iron deficiency anemia   . Migraine headache   . PPD positive   . Pulmonary nodules    not seen on plain cxr, first detected 1/07 re ct 10/08 pos IPPD > 15 mm 12/09 FOB/lavage 04/20/08 neg afb smear  . PVC's (premature ventricular contractions)     Social History   Socioeconomic History  . Marital status: Divorced    Spouse name: Not on file  . Number of children: 2  . Years of education: Not on file  . Highest education level: Not on file  Social Needs  . Financial resource strain: Not on file  . Food insecurity - worry: Not on file  . Food insecurity - inability: Not on file  . Transportation needs - medical: Not on file  . Transportation needs - non-medical: Not on file  Occupational History  . Occupation: accounts Sport and exercise psychologist: [employer]  Tobacco Use  . Smoking status: Never Smoker  . Smokeless tobacco: Never Used  Substance and Sexual Activity  . Alcohol use: No    Alcohol/week: 0.0 oz  . Drug use: No  .  Sexual activity: Not on file  Other Topics Concern  . Not on file  Social History Narrative   Divorced with children   She works in Press photographer    Possible TB exposure 2008 (after nodules found)   From [place] lived there and [place]   Never owned birds       Past Surgical History:  Procedure Laterality Date  . ABDOMINAL HYSTERECTOMY    . BREAST BIOPSY Right   . CHOLECYSTECTOMY  2014  . COLONOSCOPY  05/04/2009   prolapsing external hemorrhoids  . ENDOMETRIAL ABLATION    . HEMORRHOID SURGERY     x 3  . TUBAL LIGATION      Family History  Problem Relation Age of Onset  . Heart disease Maternal Grandmother        aunts, uncles, sister  . Diabetes Mellitus II Maternal Grandmother   . Kidney failure Sister   . Diabetes Mellitus II Sister   . Lung cancer Unknown        aunts and uncles  . Colon cancer Maternal Aunt        aunts and uncles  . Diabetes Mellitus II Maternal Aunt   . Colon cancer Maternal Uncle   . Diabetes Mellitus II Maternal Uncle   . Diabetes type II Mother  No Known Allergies  Current Outpatient Medications on File Prior to Visit  Medication Sig Dispense Refill  . aspirin-acetaminophen-caffeine (EXCEDRIN EXTRA STRENGTH) 250-250-65 MG per tablet Take 1 tablet by mouth every 6 (six) hours as needed (for migraines).     . Ibuprofen-Famotidine 800-26.6 MG TABS Take 1 tablet 3 times daily as needed. 270 tablet 1  . levothyroxine (SYNTHROID, LEVOTHROID) 100 MCG tablet Take 1 tablet (100 mcg total) by mouth daily. 90 tablet 3  . losartan-hydrochlorothiazide (HYZAAR) 100-12.5 MG tablet Take 1 tablet by mouth daily. 90 tablet 3  . SUMAtriptan (IMITREX) 50 MG tablet Take 1 tablet (50 mg total) by mouth every 2 (two) hours as needed for migraine. May repeat in 2 hours if headache persists or recurs. 10 tablet 3   No current facility-administered medications on file prior to visit.     BP 140/86   Temp 98.3 F (36.8 C) (Oral)   Wt 180 lb (81.6 kg)   BMI 29.05 kg/m         Objective:   Physical Exam  Constitutional: She is oriented to person, place, and time. She appears well-developed and well-nourished. No distress.  HENT:  Head: Normocephalic and atraumatic.  Right Ear: Hearing, tympanic membrane, external ear and ear canal normal.  Left Ear: Hearing, tympanic membrane, external ear and ear canal normal.  Nose: Nose normal. No mucosal edema or rhinorrhea.  Mouth/Throat: Uvula is midline, oropharynx is clear and moist and mucous membranes are normal.  Eyes: Conjunctivae are normal. Right eye exhibits no discharge.  Neck: Normal range of motion. Neck supple. No thyromegaly present.  Cardiovascular: Normal rate, regular rhythm, normal heart sounds and intact distal pulses. Exam reveals no gallop and no friction rub.  No murmur heard. Pulmonary/Chest: Effort normal and breath sounds normal. No respiratory distress. She has no wheezes. She has no rales. She exhibits no tenderness.  Lymphadenopathy:    She has no cervical adenopathy.  Neurological: She is alert and oriented to person, place, and time.  Skin: Skin is warm and dry. No rash noted. She is not diaphoretic. No erythema. No pallor.  Psychiatric: She has a normal mood and affect. Her behavior is normal. Judgment and thought content normal.  Nursing note and vitals reviewed.     Assessment & Plan:  1. Viral URI - HYDROcodone-homatropine (HYCODAN) 5-1.5 MG/5ML syrup; Take 5 mLs by mouth every 8 (eight) hours as needed for cough.  Dispense: 120 mL; Refill: 0 - benzonatate (TESSALON) 200 MG capsule; Take 1 capsule (200 mg total) by mouth 2 (two) times daily as needed for cough.  Dispense: 20 capsule; Refill: 1 - Follow up in 2-3 days or sooner if symptoms worsen or fever develops   [name], NP

## 2017-05-14 ENCOUNTER — Ambulatory Visit: Payer: BLUE CROSS/BLUE SHIELD | Admitting: Pulmonary Disease

## 2017-06-03 ENCOUNTER — Ambulatory Visit: Payer: BLUE CROSS/BLUE SHIELD | Admitting: Pulmonary Disease

## 2017-06-13 ENCOUNTER — Encounter: Payer: Self-pay | Admitting: Adult Health

## 2017-06-13 ENCOUNTER — Ambulatory Visit (INDEPENDENT_AMBULATORY_CARE_PROVIDER_SITE_OTHER): Payer: BLUE CROSS/BLUE SHIELD | Admitting: Adult Health

## 2017-06-13 DIAGNOSIS — G4733 Obstructive sleep apnea (adult) (pediatric): Secondary | ICD-10-CM | POA: Diagnosis not present

## 2017-06-13 NOTE — Patient Instructions (Addendum)
Work on healthy weight .  Can contact Dr. Ron Parker for oral appliance , [phone] Avoid sleeping on your back  Do not drive if sleepy  Follow up Dr. Halford Chessman  In  1 year and As needed

## 2017-06-13 NOTE — Progress Notes (Signed)
'@Patient'  ID: [name], female    DOB: 1966-03-18, 52 y.o.   MRN: [id number]  Chief Complaint  Patient presents with  . Follow-up    OSA    Referring provider: [name], NP  HPI: 52 year old female seen for sleep consult March 03, 2017 found to have mild sleep apnea on home sleep study  06/13/2017 Follow up ; OSA  Patient returns for follow-up for LEEP apnea.  Patient was seen for a sleep consult in October.  She was set up for a home sleep study that showed she had mild sleep apnea with an AHI at 12 events per hour.  We discussed her test results went over sleep apnea and potential treatments including weight loss oral appliance and CPAP.  Patient says she does not wish to start on CPAP.  She says she will work on weight loss and consider oral appliance.  A lot of her events occurred when she was in a supine position we discussed sleep positioning.   No Known Allergies  Immunization History  Administered Date(s) Administered  . Influenza Split 02/03/2017  . Td 05/07/2007    Past Medical History:  Diagnosis Date  . Bell's palsy    with res left lag  . Essential hypertension   . External hemorrhoids   . History of pneumonia   . Hyperlipidemia   . Hyperthyroidism    hx 52 years old- oral meds  . Iron deficiency anemia   . Migraine headache   . PPD positive   . Pulmonary nodules    not seen on plain cxr, first detected 1/07 re ct 10/08 pos IPPD > 15 mm 12/09 FOB/lavage 04/20/08 neg afb smear  . PVC's (premature ventricular contractions)     Tobacco History: Social History   Tobacco Use  Smoking Status Never Smoker  Smokeless Tobacco Never Used   Counseling given: Not Answered   Outpatient Encounter Medications as of 06/13/2017  Medication Sig  . aspirin-acetaminophen-caffeine (EXCEDRIN EXTRA STRENGTH) 250-250-65 MG per tablet Take 1 tablet by mouth every 6 (six) hours as needed (for migraines).   . benzonatate (TESSALON) 200 MG capsule Take 1 capsule  (200 mg total) by mouth 2 (two) times daily as needed for cough.  Marland Kitchen HYDROcodone-homatropine (HYCODAN) 5-1.5 MG/5ML syrup Take 5 mLs by mouth every 8 (eight) hours as needed for cough.  . levothyroxine (SYNTHROID, LEVOTHROID) 100 MCG tablet Take 1 tablet (100 mcg total) by mouth daily.  Marland Kitchen losartan-hydrochlorothiazide (HYZAAR) 100-12.5 MG tablet Take 1 tablet by mouth daily.  . SUMAtriptan (IMITREX) 50 MG tablet Take 1 tablet (50 mg total) by mouth every 2 (two) hours as needed for migraine. May repeat in 2 hours if headache persists or recurs.  . [DISCONTINUED] Ibuprofen-Famotidine 800-26.6 MG TABS Take 1 tablet 3 times daily as needed. (Patient not taking: Reported on 06/13/2017)   No facility-administered encounter medications on file as of 06/13/2017.      Review of Systems  Constitutional:   No  weight loss, night sweats,  Fevers, chills, fatigue, or  lassitude.  HEENT:   No headaches,  Difficulty swallowing,  Tooth/dental problems, or  Sore throat,                No sneezing, itching, ear ache, nasal congestion, post nasal drip,   CV:  No chest pain,  Orthopnea, PND, swelling in lower extremities, anasarca, dizziness, palpitations, syncope.   GI  No heartburn, indigestion, abdominal pain, nausea, vomiting, diarrhea, change in bowel habits, loss of  appetite, bloody stools.   Resp: No shortness of breath with exertion or at rest.  No excess mucus, no productive cough,  No non-productive cough,  No coughing up of blood.  No change in color of mucus.  No wheezing.  No chest wall deformity  Skin: no rash or lesions.  GU: no dysuria, change in color of urine, no urgency or frequency.  No flank pain, no hematuria   MS:  No joint pain or swelling.  No decreased range of motion.  No back pain.    Physical Exam  BP 132/72 (BP Location: Right Arm, Cuff Size: Normal)   Pulse 74   Ht '5\' 8"'  (1.727 m)   Wt 174 lb 9.6 oz (79.2 kg)   SpO2 100%   BMI 26.55 kg/m   GEN: A/Ox3; pleasant , NAD,  well nourished    HEENT:  [place]/AT,  EACs-clear, TMs-wnl, NOSE-clear, THROAT-clear, no lesions, no postnasal drip or exudate noted. Class 2-3 MP airway   NECK:  Supple w/ fair ROM; no JVD; normal carotid impulses w/o bruits; no thyromegaly or nodules palpated; no lymphadenopathy.    RESP  Clear  P & A; w/o, wheezes/ rales/ or rhonchi. no accessory muscle use, no dullness to percussion  CARD:  RRR, no m/r/g, no peripheral edema, pulses intact, no cyanosis or clubbing.  GI:   Soft & nt; nml bowel sounds; no organomegaly or masses detected.   Musco: Warm bil, no deformities or joint swelling noted.   Neuro: alert, no focal deficits noted.    Skin: Warm, no lesions or rashes    Lab Results:  CBC    Component Value Date/Time   WBC 5.8 01/20/2017 0837   RBC 4.31 01/20/2017 0837   HGB 13.2 01/20/2017 0837   HCT 39.3 01/20/2017 0837   PLT 260.0 01/20/2017 0837   MCV 91.1 01/20/2017 0837   MCH 29.7 06/22/2012 1438   MCHC 33.7 01/20/2017 0837   RDW 13.8 01/20/2017 0837   LYMPHSABS 2.1 01/20/2017 0837   MONOABS 0.3 01/20/2017 0837   EOSABS 0.3 01/20/2017 0837   BASOSABS 0.1 01/20/2017 0837    BMET    Component Value Date/Time   NA 141 01/20/2017 0837   K 3.6 01/20/2017 0837   CL 106 01/20/2017 0837   CO2 28 01/20/2017 0837   GLUCOSE 106 (H) 01/20/2017 0837   BUN 10 01/20/2017 0837   CREATININE 0.93 01/20/2017 0837   CALCIUM 9.1 01/20/2017 0837   GFRNONAA >60 06/16/2009 1014   GFRAA  06/16/2009 1014    >60        The eGFR has been calculated using the MDRD equation. This calculation has not been validated in all clinical situations. eGFR's persistently <60 mL/min signify possible Chronic Kidney Disease.    BNP No results found for: BNP  ProBNP No results found for: PROBNP  Imaging: No results found.   Assessment & Plan:   OSA (obstructive sleep apnea) Does not want CPAP  Wt loss, oral appliance and sleep positioning.   Plan  Patient Instructions    Work on healthy weight .  Can contact Dr. Ron Parker for oral appliance , [phone] Avoid sleeping on your back  Do not drive if sleepy  Follow up Dr. Halford Chessman  In  1 year and As needed          Rexene Edison, NP 06/13/2017

## 2017-06-13 NOTE — Assessment & Plan Note (Signed)
Does not want CPAP  Wt loss, oral appliance and sleep positioning.   Plan  Patient Instructions  Work on healthy weight .  Can contact Dr. Ron Parker for oral appliance , [phone] Avoid sleeping on your back  Do not drive if sleepy  Follow up Dr. Halford Chessman  In  1 year and As needed

## 2017-06-14 NOTE — Progress Notes (Signed)
Reviewed and agree with assessment/plan.   Loriene Taunton, MD [facility] 05/01/2016, 12:24 PM Pager:  [phone]  

## 2017-07-11 ENCOUNTER — Ambulatory Visit (INDEPENDENT_AMBULATORY_CARE_PROVIDER_SITE_OTHER): Payer: BLUE CROSS/BLUE SHIELD | Admitting: Family Medicine

## 2017-07-11 ENCOUNTER — Encounter: Payer: Self-pay | Admitting: Family Medicine

## 2017-07-11 VITALS — BP 100/60 | HR 93 | Temp 98.3°F | Ht 68.0 in | Wt 170.8 lb

## 2017-07-11 DIAGNOSIS — J069 Acute upper respiratory infection, unspecified: Secondary | ICD-10-CM

## 2017-07-11 DIAGNOSIS — B9789 Other viral agents as the cause of diseases classified elsewhere: Secondary | ICD-10-CM

## 2017-07-11 DIAGNOSIS — R6889 Other general symptoms and signs: Secondary | ICD-10-CM | POA: Diagnosis not present

## 2017-07-11 LAB — POC INFLUENZA A&B (BINAX/QUICKVUE)
Influenza A, POC: NEGATIVE
Influenza B, POC: NEGATIVE

## 2017-07-11 NOTE — Patient Instructions (Addendum)

## 2017-07-11 NOTE — Progress Notes (Signed)
Subjective:    Patient ID: [name], female    DOB: [birth year], 52 y.o.   MRN: [id number]  Chief Complaint  Patient presents with  . Generalized Body Aches    x4 days, decreased appetite  . Fatigue  . Cough    non-productive x4 days  . Fever    101 degrees on Monday, none noted since, co-workers have been diagnosed with flu    HPI Patient was seen today for acute concerns.  Patient endorses feeling achy at night, cough, fatigue, decreased appetite, fever T-max 101 since Monday night.  Patient has taken Tylenol and TheraFlu for this.  She endorses coworkers dx'd with the flu as sick contacts.  Past Medical History:  Diagnosis Date  . Bell's palsy    with res left lag  . Essential hypertension   . External hemorrhoids   . History of pneumonia   . Hyperlipidemia   . Hyperthyroidism    hx 52 years old- oral meds  . Iron deficiency anemia   . Migraine headache   . PPD positive   . Pulmonary nodules    not seen on plain cxr, first detected 1/07 re ct 10/08 pos IPPD > 15 mm 12/09 FOB/lavage 04/20/08 neg afb smear  . PVC's (premature ventricular contractions)     No Known Allergies  ROS General: Denies fever, chills, night sweats, changes in weight, changes in appetite  + aches, fatigue, fever 101 HEENT: Denies headaches, ear pain, changes in vision, rhinorrhea, sore throat   CV: Denies CP, palpitations, SOB, orthopnea Pulm: Denies SOB, wheezing  + cough GI: Denies abdominal pain, nausea, vomiting, diarrhea, constipation GU: Denies dysuria, hematuria, frequency, vaginal discharge Msk: Denies muscle cramps, joint pains Neuro: Denies weakness, numbness, tingling Skin: Denies rashes, bruising Psych: Denies depression, anxiety, hallucinations     Objective:    Blood pressure 100/60, pulse 93, temperature 98.3 F (36.8 C), temperature source Oral, height 5\' 8"  (1.727 m), weight 170 lb 12.8 oz (77.5 kg), SpO2 98 %.   Gen. Pleasant, well-nourished, in no distress,  normal affect   HEENT: [place]/AT, face symmetric, no scleral icterus, PERRLA, nares patent without drainage, pharynx with mild erythema, no exudate.  TMs normal bilaterally.  No cervical lymphadenopathy. Lungs: no accessory muscle use, CTAB, no wheezes or rales Cardiovascular: RRR, no m/r/g, no peripheral edema Abdomen: BS present, soft, NT/ND Neuro:  A&Ox3, CN II-XII intact, normal gait    Wt Readings from Last 3 Encounters:  07/11/17 170 lb 12.8 oz (77.5 kg)  06/13/17 174 lb 9.6 oz (79.2 kg)  05/01/17 180 lb (81.6 kg)    Lab Results  Component Value Date   WBC 5.8 01/20/2017   HGB 13.2 01/20/2017   HCT 39.3 01/20/2017   PLT 260.0 01/20/2017   GLUCOSE 106 (H) 01/20/2017   CHOL 234 (H) 01/20/2017   TRIG 107.0 01/20/2017   HDL 34.40 (L) 01/20/2017   LDLDIRECT 76.8 05/23/2011   LDLCALC 179 (H) 01/20/2017   ALT 14 01/20/2017   AST 16 01/20/2017   NA 141 01/20/2017   K 3.6 01/20/2017   CL 106 01/20/2017   CREATININE 0.93 01/20/2017   BUN 10 01/20/2017   CO2 28 01/20/2017   TSH 2.16 02/27/2017   INR 1.06 06/17/2009   HGBA1C 5.3 01/20/2017    Assessment/Plan:  Viral URI with cough -Continue supportive care -continue tylenol and theraflu prn -encouraged to stay hydrated.  Flu-like symptoms - Plan: POC Influenza A&B(BINAX/QUICKVUE) negative  Follow-up as needed  Grier Mitts, MD

## 2017-09-27 ENCOUNTER — Other Ambulatory Visit: Payer: Self-pay | Admitting: Adult Health

## 2017-09-30 ENCOUNTER — Other Ambulatory Visit: Payer: Self-pay | Admitting: Adult Health

## 2017-09-30 DIAGNOSIS — G43719 Chronic migraine without aura, intractable, without status migrainosus: Secondary | ICD-10-CM

## 2017-09-30 NOTE — Telephone Encounter (Signed)
DENIED.  FILLED ON 01/21/17 FOR 1 YEAR.  MESSAGE SENT TO THE PHARMACY TO CHECK FILE.

## 2017-09-30 NOTE — Telephone Encounter (Signed)
Patient called and asked for a refill on her migraine medication, but did not know what it was. She wanted to speak to a [place].  Patient called back and I asked did she have a migraine today. She says "no, I had one this weekend, twice, and took Aleve and Exedrin Migraine alternating." I advised a new prescription will need to be sent in and it could take up to 72 hours, she verbalized understanding.  Imitrex refill Last OV:01/20/17 Last refill:02/13/16 10 tab/3 refills UDJ:[id number] Pharmacy: [id number] - [place], [place] [phone] (Phone) [phone] (Fax)

## 2017-09-30 NOTE — Telephone Encounter (Signed)
This encounter was created in error - please disregard.

## 2017-10-01 ENCOUNTER — Ambulatory Visit (INDEPENDENT_AMBULATORY_CARE_PROVIDER_SITE_OTHER): Payer: BLUE CROSS/BLUE SHIELD | Admitting: Adult Health

## 2017-10-01 ENCOUNTER — Encounter: Payer: Self-pay | Admitting: Adult Health

## 2017-10-01 VITALS — BP 116/80 | Temp 98.4°F | Wt 177.0 lb

## 2017-10-01 DIAGNOSIS — J0140 Acute pansinusitis, unspecified: Secondary | ICD-10-CM | POA: Diagnosis not present

## 2017-10-01 MED ORDER — SUMATRIPTAN SUCCINATE 50 MG PO TABS: 50.0000 mg | ORAL_TABLET | ORAL | 1 refills | Status: DC | PRN

## 2017-10-01 MED ORDER — FLUTICASONE PROPIONATE 50 MCG/ACT NA SUSP
2.0000 | Freq: Every day | NASAL | 6 refills | Status: DC
Start: 2017-10-01 — End: 2018-06-25

## 2017-10-01 MED ORDER — DOXYCYCLINE HYCLATE 100 MG PO CAPS: 100.0000 mg | ORAL_CAPSULE | Freq: Two times a day (BID) | ORAL | 0 refills | Status: DC

## 2017-10-01 NOTE — Progress Notes (Signed)
Subjective:    Patient ID: [name], female    DOB: Mar 31, 1966, 52 y.o.   MRN: [id number]  Sinusitis  This is a new problem. The current episode started in the past 7 days. The problem has been gradually worsening since onset. There has been no fever. Associated symptoms include congestion, coughing (semi productive ), ear pain, headaches and sinus pressure. Pertinent negatives include no chills or sore throat. Past treatments include nothing. The treatment provided no relief.      Review of Systems  Constitutional: Positive for activity change, appetite change and fatigue. Negative for chills and fever.  HENT: Positive for congestion, ear pain, postnasal drip, rhinorrhea and sinus pressure. Negative for sore throat.   Respiratory: Positive for cough (semi productive ).   Cardiovascular: Negative.   Gastrointestinal: Negative.   Neurological: Positive for headaches.   Past Medical History:  Diagnosis Date  . Bell's palsy    with res left lag  . Essential hypertension   . External hemorrhoids   . History of pneumonia   . Hyperlipidemia   . Hyperthyroidism    hx 51 years old- oral meds  . Iron deficiency anemia   . Migraine headache   . PPD positive   . Pulmonary nodules    not seen on plain cxr, first detected 1/07 re ct 10/08 pos IPPD > 15 mm 12/09 FOB/lavage 04/20/08 neg afb smear  . PVC's (premature ventricular contractions)     Social History   Socioeconomic History  . Marital status: Divorced    Spouse name: Not on file  . Number of children: 2  . Years of education: Not on file  . Highest education level: Not on file  Occupational History  . Occupation: accounts Sport and exercise psychologist: [employer]  Social Needs  . Financial resource strain: Not on file  . Food insecurity:    Worry: Not on file    Inability: Not on file  . Transportation needs:    Medical: Not on file    Non-medical: Not on file  Tobacco Use  . Smoking status: Never Smoker  . Smokeless  tobacco: Never Used  Substance and Sexual Activity  . Alcohol use: No    Alcohol/week: 0.0 oz  . Drug use: No  . Sexual activity: Not on file  Lifestyle  . Physical activity:    Days per week: Not on file    Minutes per session: Not on file  . Stress: Not on file  Relationships  . Social connections:    Talks on phone: Not on file    Gets together: Not on file    Attends religious service: Not on file    Active member of club or organization: Not on file    Attends meetings of clubs or organizations: Not on file    Relationship status: Not on file  . Intimate partner violence:    Fear of current or ex partner: Not on file    Emotionally abused: Not on file    Physically abused: Not on file    Forced sexual activity: Not on file  Other Topics Concern  . Not on file  Social History Narrative   Divorced with children   She works in Press photographer    Possible TB exposure 2008 (after nodules found)   From [place] lived there and [place]   Never owned birds       Past Surgical History:  Procedure Laterality Date  . ABDOMINAL HYSTERECTOMY    .  BREAST BIOPSY Right   . CHOLECYSTECTOMY  2014  . COLONOSCOPY  05/04/2009   prolapsing external hemorrhoids  . ENDOMETRIAL ABLATION    . HEMORRHOID SURGERY     x 3  . TUBAL LIGATION      Family History  Problem Relation Age of Onset  . Heart disease Maternal Grandmother        aunts, uncles, sister  . Diabetes Mellitus II Maternal Grandmother   . Kidney failure Sister   . Diabetes Mellitus II Sister   . Lung cancer Unknown        aunts and uncles  . Colon cancer Maternal Aunt        aunts and uncles  . Diabetes Mellitus II Maternal Aunt   . Colon cancer Maternal Uncle   . Diabetes Mellitus II Maternal Uncle   . Diabetes type II Mother     No Known Allergies  Current Outpatient Medications on File Prior to Visit  Medication Sig Dispense Refill  . aspirin-acetaminophen-caffeine (EXCEDRIN EXTRA STRENGTH) 250-250-65 MG per tablet Take  1 tablet by mouth every 6 (six) hours as needed (for migraines).     Marland Kitchen levothyroxine (SYNTHROID, LEVOTHROID) 100 MCG tablet Take 1 tablet (100 mcg total) by mouth daily. 90 tablet 3  . losartan-hydrochlorothiazide (HYZAAR) 100-12.5 MG tablet Take 1 tablet by mouth daily. 90 tablet 3  . SUMAtriptan (IMITREX) 50 MG tablet Take 1 tablet (50 mg total) by mouth every 2 (two) hours as needed for migraine. May repeat in 2 hours if headache persists or recurs. 10 tablet 1   No current facility-administered medications on file prior to visit.     BP 116/80   Temp 98.4 F (36.9 C) (Oral)   Wt 177 lb (80.3 kg)   BMI 26.91 kg/m       Objective:   Physical Exam  Constitutional: She appears well-developed and well-nourished. She appears ill. No distress.  HENT:  Right Ear: Hearing, tympanic membrane, external ear and ear canal normal.  Left Ear: Hearing, tympanic membrane and external ear normal.  Nose: Mucosal edema and rhinorrhea present. Right sinus exhibits no maxillary sinus tenderness and no frontal sinus tenderness. Left sinus exhibits no maxillary sinus tenderness and no frontal sinus tenderness.  Mouth/Throat: Uvula is midline, oropharynx is clear and moist and mucous membranes are normal.  Cardiovascular: Normal rate, regular rhythm, normal heart sounds and intact distal pulses. Exam reveals no gallop and no friction rub.  No murmur heard. Pulmonary/Chest: Effort normal and breath sounds normal. No stridor. No respiratory distress. She has no wheezes. She has no rales. She exhibits no tenderness.  Skin: Skin is warm and dry. Capillary refill takes less than 2 seconds. She is not diaphoretic.  Psychiatric: She has a normal mood and affect. Her behavior is normal. Judgment and thought content normal.  Nursing note and vitals reviewed.     Assessment & Plan:  1. Acute non-recurrent pansinusitis - Will treat due to symptoms. Advised antibiotics in conjunction with Rest and hydration.  -  doxycycline (VIBRAMYCIN) 100 MG capsule; Take 1 capsule (100 mg total) by mouth 2 (two) times daily.  Dispense: 14 capsule; Refill: 0 - fluticasone (FLONASE) 50 MCG/ACT nasal spray; Place 2 sprays into both nostrils daily.  Dispense: 16 g; Refill: 6 - Follow up if no improvement in the next 2-3 days  - Follow up as needed  [name], NP

## 2017-10-01 NOTE — Telephone Encounter (Signed)
Sent to the pharmacy by e-scribe. 

## 2017-10-03 ENCOUNTER — Telehealth: Payer: Self-pay | Admitting: Adult Health

## 2017-10-03 MED ORDER — HYDROCODONE-HOMATROPINE 5-1.5 MG/5ML PO SYRP: 5.0000 mL | ORAL_SOLUTION | Freq: Three times a day (TID) | ORAL | 0 refills | Status: DC | PRN

## 2017-10-03 NOTE — Telephone Encounter (Signed)
Pt.notified

## 2017-10-03 NOTE — Telephone Encounter (Signed)
Hycodan sent into pharmacy

## 2017-10-03 NOTE — Telephone Encounter (Signed)
Copied from [place] [id number]. Topic: Quick Communication - See Telephone Encounter >> Oct 03, 2017  1:06 PM Ether Griffins B wrote: CRM for notification. See Telephone encounter for: 10/03/17.  Pt was seen in the office on 10/01/17. She is calling in requesting a cough syrup or something OTC be recommened for her cough. She feels it is moving in to her chest now. Please [facility] to [id number] - [place], [place]

## 2017-10-07 DIAGNOSIS — K6289 Other specified diseases of anus and rectum: Secondary | ICD-10-CM | POA: Diagnosis not present

## 2017-10-07 DIAGNOSIS — K5790 Diverticulosis of intestine, part unspecified, without perforation or abscess without bleeding: Secondary | ICD-10-CM | POA: Diagnosis not present

## 2017-10-07 DIAGNOSIS — K5900 Constipation, unspecified: Secondary | ICD-10-CM | POA: Diagnosis not present

## 2017-10-07 DIAGNOSIS — K648 Other hemorrhoids: Secondary | ICD-10-CM | POA: Diagnosis not present

## 2017-10-10 ENCOUNTER — Telehealth: Payer: Self-pay | Admitting: *Deleted

## 2017-10-10 NOTE — Telephone Encounter (Signed)
Prior auth for Sumatriptan 50mg  sent to [email or web address] and immediate approval was given-[id number] - [place] Case ID: [id number].  [facility] and informed Maudie Mercury of this. Marland Kitchen

## 2017-10-15 ENCOUNTER — Encounter: Payer: Self-pay | Admitting: Adult Health

## 2017-10-15 ENCOUNTER — Ambulatory Visit (INDEPENDENT_AMBULATORY_CARE_PROVIDER_SITE_OTHER): Payer: BLUE CROSS/BLUE SHIELD | Admitting: Adult Health

## 2017-10-15 VITALS — BP 134/88 | Temp 98.3°F | Wt 175.0 lb

## 2017-10-15 DIAGNOSIS — W57XXXA Bitten or stung by nonvenomous insect and other nonvenomous arthropods, initial encounter: Secondary | ICD-10-CM

## 2017-10-15 DIAGNOSIS — S30861A Insect bite (nonvenomous) of abdominal wall, initial encounter: Secondary | ICD-10-CM | POA: Diagnosis not present

## 2017-10-15 NOTE — Progress Notes (Signed)
Subjective:    Patient ID: [name], female    DOB: [birth year], 52 y.o.   MRN: [id number]  HPI  52 year old female who  has a past medical history of Bell's palsy, Essential hypertension, External hemorrhoids, History of pneumonia, Hyperlipidemia, Hyperthyroidism, Iron deficiency anemia, Migraine headache, PPD positive, Pulmonary nodules, and PVC's (premature ventricular contractions).  Resents to the office today for removal of tick on left lower abdomen.  Noticed it this morning while showering.  Believes that the tick was attached for less than 24 hours.  He denies any fevers, joint pain, muscle aches, or bull's-eye rash Review of Systems See HPI   Past Medical History:  Diagnosis Date  . Bell's palsy    with res left lag  . Essential hypertension   . External hemorrhoids   . History of pneumonia   . Hyperlipidemia   . Hyperthyroidism    hx 52 years old- oral meds  . Iron deficiency anemia   . Migraine headache   . PPD positive   . Pulmonary nodules    not seen on plain cxr, first detected 1/07 re ct 10/08 pos IPPD > 15 mm 12/09 FOB/lavage 04/20/08 neg afb smear  . PVC's (premature ventricular contractions)     Social History   Socioeconomic History  . Marital status: Divorced    Spouse name: Not on file  . Number of children: 2  . Years of education: Not on file  . Highest education level: Not on file  Occupational History  . Occupation: accounts Sport and exercise psychologist: [employer]  Social Needs  . Financial resource strain: Not on file  . Food insecurity:    Worry: Not on file    Inability: Not on file  . Transportation needs:    Medical: Not on file    Non-medical: Not on file  Tobacco Use  . Smoking status: Never Smoker  . Smokeless tobacco: Never Used  Substance and Sexual Activity  . Alcohol use: No    Alcohol/week: 0.0 oz  . Drug use: No  . Sexual activity: Not on file  Lifestyle  . Physical activity:    Days per week: Not on file    Minutes  per session: Not on file  . Stress: Not on file  Relationships  . Social connections:    Talks on phone: Not on file    Gets together: Not on file    Attends religious service: Not on file    Active member of club or organization: Not on file    Attends meetings of clubs or organizations: Not on file    Relationship status: Not on file  . Intimate partner violence:    Fear of current or ex partner: Not on file    Emotionally abused: Not on file    Physically abused: Not on file    Forced sexual activity: Not on file  Other Topics Concern  . Not on file  Social History Narrative   Divorced with children   She works in Press photographer    Possible TB exposure 2008 (after nodules found)   From [place] lived there and [place]   Never owned birds       Past Surgical History:  Procedure Laterality Date  . ABDOMINAL HYSTERECTOMY    . BREAST BIOPSY Right   . CHOLECYSTECTOMY  2014  . COLONOSCOPY  05/04/2009   prolapsing external hemorrhoids  . ENDOMETRIAL ABLATION    . HEMORRHOID SURGERY  x 3  . TUBAL LIGATION      Family History  Problem Relation Age of Onset  . Heart disease Maternal Grandmother        aunts, uncles, sister  . Diabetes Mellitus II Maternal Grandmother   . Kidney failure Sister   . Diabetes Mellitus II Sister   . Lung cancer Unknown        aunts and uncles  . Colon cancer Maternal Aunt        aunts and uncles  . Diabetes Mellitus II Maternal Aunt   . Colon cancer Maternal Uncle   . Diabetes Mellitus II Maternal Uncle   . Diabetes type II Mother     No Known Allergies  Current Outpatient Medications on File Prior to Visit  Medication Sig Dispense Refill  . aspirin-acetaminophen-caffeine (EXCEDRIN EXTRA STRENGTH) 250-250-65 MG per tablet Take 1 tablet by mouth every 6 (six) hours as needed (for migraines).     . fluticasone (FLONASE) 50 MCG/ACT nasal spray Place 2 sprays into both nostrils daily. 16 g 6  . levothyroxine (SYNTHROID, LEVOTHROID) 100 MCG tablet  Take 1 tablet (100 mcg total) by mouth daily. 90 tablet 3  . losartan-hydrochlorothiazide (HYZAAR) 100-12.5 MG tablet Take 1 tablet by mouth daily. 90 tablet 3  . SUMAtriptan (IMITREX) 50 MG tablet Take 1 tablet (50 mg total) by mouth every 2 (two) hours as needed for migraine. May repeat in 2 hours if headache persists or recurs. 10 tablet 1  . HYDROcodone-homatropine (HYCODAN) 5-1.5 MG/5ML syrup Take 5 mLs by mouth every 8 (eight) hours as needed for cough. (Patient not taking: Reported on 10/15/2017) 120 mL 0   No current facility-administered medications on file prior to visit.     BP 134/88   Temp 98.3 F (36.8 C) (Oral)   Wt 175 lb (79.4 kg)   BMI 26.61 kg/m       Objective:   Physical Exam  Constitutional: She is oriented to person, place, and time. She appears well-developed and well-nourished. No distress.  Cardiovascular: Exam reveals no gallop.  Pulmonary/Chest: Effort normal.  Neurological: She is alert and oriented to person, place, and time.  Skin: Skin is warm and dry. No rash noted. She is not diaphoretic. No erythema. No pallor.  Tick noted on left lower abdomen.  Tick was easily removed in its entirety.  Psychiatric: She has a normal mood and affect. Her behavior is normal. Thought content normal.  Vitals reviewed.     Assessment & Plan:  1. Tick bite of abdomen, initial encounter -Tick was easily removed.  Tick was not engorged.  Due to timeframe of tick being attached there is no indication of treatment for tickborne illness.  Advised watchful waiting.  She will follow-up with any signs or symptoms of tickborne illness.  [name], NP

## 2017-10-16 ENCOUNTER — Encounter: Payer: Self-pay | Admitting: Family Medicine

## 2018-02-18 ENCOUNTER — Other Ambulatory Visit: Payer: Self-pay | Admitting: Adult Health

## 2018-02-18 DIAGNOSIS — I10 Essential (primary) hypertension: Secondary | ICD-10-CM

## 2018-02-18 NOTE — Telephone Encounter (Signed)
Sent to the pharmacy by e-scribe.  I have scheduled the pt for 03/25/18 for cpx.

## 2018-03-06 ENCOUNTER — Other Ambulatory Visit: Payer: Self-pay | Admitting: Family Medicine

## 2018-03-06 ENCOUNTER — Other Ambulatory Visit: Payer: Self-pay | Admitting: Adult Health

## 2018-03-06 DIAGNOSIS — Z1231 Encounter for screening mammogram for malignant neoplasm of breast: Secondary | ICD-10-CM

## 2018-03-16 ENCOUNTER — Telehealth: Payer: Self-pay | Admitting: Adult Health

## 2018-03-16 NOTE — Telephone Encounter (Signed)
Copied from [place] [id number]. Topic: General - Other >> Mar 16, 2018  2:49 PM Oneta Rack wrote: Relation to pt: self    Call back number: [phone] [facility]acy:  [id number] [place], [place] [phone] (Phone) [phone] (Fax)  Reason for call:  As per pharmacist losartan-hydrochlorothiazide (HYZAAR) 100-12.5 MG tablet is on back order requesting 2 new rx reflecting losartan and another for hydrochlorothiazide, patient would like refill to hold her over until 03/25/18 physical appointment, please advise

## 2018-03-17 MED ORDER — HYDROCHLOROTHIAZIDE 25 MG PO TABS: 25.0000 mg | ORAL_TABLET | Freq: Every day | ORAL | 0 refills | Status: DC

## 2018-03-17 MED ORDER — LOSARTAN POTASSIUM 100 MG PO TABS: 100.0000 mg | ORAL_TABLET | Freq: Every day | ORAL | 0 refills | Status: DC

## 2018-03-17 NOTE — Telephone Encounter (Signed)
Ok to split

## 2018-03-17 NOTE — Telephone Encounter (Signed)
Sent to the pharmacy by e-scribe. 

## 2018-03-25 ENCOUNTER — Encounter: Payer: BLUE CROSS/BLUE SHIELD | Admitting: Adult Health

## 2018-03-25 ENCOUNTER — Encounter: Payer: Self-pay | Admitting: Adult Health

## 2018-03-25 NOTE — Progress Notes (Deleted)
Subjective:    Patient ID: [name], female    DOB: 1965-07-13, 52 y.o.   MRN: [id number]  HPI Patient presents for yearly preventative medicine examination. She is a pleasant 52 year old female who  has a past medical history of Bell's palsy, Essential hypertension, External hemorrhoids, History of pneumonia, Hyperlipidemia, Hyperthyroidism, Iron deficiency anemia, Migraine headache, PPD positive, Pulmonary nodules, and PVC's (premature ventricular contractions).   Hypothyroidism -currently prescribed Synthroid 100 mcg.  This was increased from 88 mcg last year, she has had a normal TSH since then  Hypertension -takes Cozaar 100 mg/hydrochlorothiazide 25 mg BP Readings from Last 3 Encounters:  10/15/17 134/88  10/01/17 116/80  07/11/17 100/60   Migraines - takes imitrex PRN   IBS - Takes Linzess   Sleep Apnea -had sleep study done in October 2018 which showed sleep apnea.  She did not want to use a CPAP at this time.  All immunizations and health maintenance protocols were reviewed with the patient and needed orders were placed.  Appropriate screening laboratory values were ordered for the patient including screening of hyperlipidemia, renal function and hepatic function.  Medication reconciliation,  past medical history, social history, problem list and allergies were reviewed in detail with the patient  Goals were established with regard to weight loss, exercise, and  diet in compliance with medications  Wt Readings from Last 3 Encounters:  10/15/17 175 lb (79.4 kg)  10/01/17 177 lb (80.3 kg)  07/11/17 170 lb 12.8 oz (77.5 kg)    She is up-to-date on routine screening such as mammogram, colonoscopy, dental and vision exams  Review of Systems  Constitutional: Negative.   HENT: Negative.   Eyes: Negative.   Respiratory: Negative.   Cardiovascular: Negative.   Gastrointestinal: Negative.   Endocrine: Negative.   Genitourinary: Negative.   Musculoskeletal:  Negative.   Skin: Negative.   Allergic/Immunologic: Negative.   Neurological: Negative.   Hematological: Negative.   Psychiatric/Behavioral: Negative.    Past Medical History:  Diagnosis Date  . Bell's palsy    with res left lag  . Essential hypertension   . External hemorrhoids   . History of pneumonia   . Hyperlipidemia   . Hyperthyroidism    hx 52 years old- oral meds  . Iron deficiency anemia   . Migraine headache   . PPD positive   . Pulmonary nodules    not seen on plain cxr, first detected 1/07 re ct 10/08 pos IPPD > 15 mm 12/09 FOB/lavage 04/20/08 neg afb smear  . PVC's (premature ventricular contractions)     Social History   Socioeconomic History  . Marital status: Divorced    Spouse name: Not on file  . Number of children: 2  . Years of education: Not on file  . Highest education level: Not on file  Occupational History  . Occupation: accounts Sport and exercise psychologist: [employer]  Social Needs  . Financial resource strain: Not on file  . Food insecurity:    Worry: Not on file    Inability: Not on file  . Transportation needs:    Medical: Not on file    Non-medical: Not on file  Tobacco Use  . Smoking status: Never Smoker  . Smokeless tobacco: Never Used  Substance and Sexual Activity  . Alcohol use: No    Alcohol/week: 0.0 standard drinks  . Drug use: No  . Sexual activity: Not on file  Lifestyle  . Physical activity:    Days  per week: Not on file    Minutes per session: Not on file  . Stress: Not on file  Relationships  . Social connections:    Talks on phone: Not on file    Gets together: Not on file    Attends religious service: Not on file    Active member of club or organization: Not on file    Attends meetings of clubs or organizations: Not on file    Relationship status: Not on file  . Intimate partner violence:    Fear of current or ex partner: Not on file    Emotionally abused: Not on file    Physically abused: Not on file    Forced  sexual activity: Not on file  Other Topics Concern  . Not on file  Social History Narrative   Divorced with children   She works in Press photographer    Possible TB exposure 2008 (after nodules found)   From [place] lived there and [place]   Never owned birds       Past Surgical History:  Procedure Laterality Date  . ABDOMINAL HYSTERECTOMY    . BREAST BIOPSY Right   . CHOLECYSTECTOMY  2014  . COLONOSCOPY  05/04/2009   prolapsing external hemorrhoids  . ENDOMETRIAL ABLATION    . HEMORRHOID SURGERY     x 3  . TUBAL LIGATION      Family History  Problem Relation Age of Onset  . Heart disease Maternal Grandmother        aunts, uncles, sister  . Diabetes Mellitus II Maternal Grandmother   . Kidney failure Sister   . Diabetes Mellitus II Sister   . Lung cancer Unknown        aunts and uncles  . Colon cancer Maternal Aunt        aunts and uncles  . Diabetes Mellitus II Maternal Aunt   . Colon cancer Maternal Uncle   . Diabetes Mellitus II Maternal Uncle   . Diabetes type II Mother     No Known Allergies  Current Outpatient Medications on File Prior to Visit  Medication Sig Dispense Refill  . aspirin-acetaminophen-caffeine (EXCEDRIN EXTRA STRENGTH) 250-250-65 MG per tablet Take 1 tablet by mouth every 6 (six) hours as needed (for migraines).     . fluticasone (FLONASE) 50 MCG/ACT nasal spray Place 2 sprays into both nostrils daily. 16 g 6  . hydrochlorothiazide (HYDRODIURIL) 25 MG tablet Take 1 tablet (25 mg total) by mouth daily. 90 tablet 0  . HYDROcodone-homatropine (HYCODAN) 5-1.5 MG/5ML syrup Take 5 mLs by mouth every 8 (eight) hours as needed for cough. (Patient not taking: Reported on 10/15/2017) 120 mL 0  . hydrocortisone (ANUSOL-HC) 25 MG suppository Place 25 mg rectally 2 (two) times daily.    Marland Kitchen levothyroxine (SYNTHROID, LEVOTHROID) 100 MCG tablet Take 1 tablet (100 mcg total) by mouth daily. 90 tablet 3  . linaclotide (LINZESS) 145 MCG CAPS capsule Take 145 mcg by mouth daily  before breakfast. Alternate with Linzess 72 mcg.    . linaclotide (LINZESS) 72 MCG capsule Take 72 mcg by mouth daily before breakfast. Alternate with Linzess 145 mcg capsule    . losartan (COZAAR) 100 MG tablet Take 1 tablet (100 mg total) by mouth daily. 90 tablet 0  . SUMAtriptan (IMITREX) 50 MG tablet Take 1 tablet (50 mg total) by mouth every 2 (two) hours as needed for migraine. May repeat in 2 hours if headache persists or recurs. 10 tablet 1   No current  facility-administered medications on file prior to visit.     There were no vitals taken for this visit.      Objective:   Physical Exam  Constitutional: She is oriented to person, place, and time. She appears well-developed and well-nourished. No distress.  HENT:  Head: Normocephalic and atraumatic.  Right Ear: External ear normal.  Left Ear: External ear normal.  Nose: Nose normal.  Mouth/Throat: Oropharynx is clear and moist. No oropharyngeal exudate.  Eyes: Pupils are equal, round, and reactive to light. Conjunctivae and EOM are normal. Right eye exhibits no discharge. Left eye exhibits no discharge. No scleral icterus.  Neck: Normal range of motion. Neck supple. No JVD present. No tracheal deviation present. No thyromegaly present.  Cardiovascular: Normal rate, regular rhythm, normal heart sounds and intact distal pulses. Exam reveals no gallop and no friction rub.  No murmur heard. Pulmonary/Chest: Effort normal and breath sounds normal. No respiratory distress. She has no wheezes. She has no rales. She exhibits no tenderness.  Abdominal: Soft. Bowel sounds are normal. She exhibits no distension and no mass. There is no tenderness. There is no rebound and no guarding. No hernia.  Musculoskeletal: Normal range of motion. She exhibits no edema, tenderness or deformity.  Lymphadenopathy:    She has no cervical adenopathy.  Neurological: She is alert and oriented to person, place, and time. She displays normal reflexes. No  cranial nerve deficit or sensory deficit. She exhibits normal muscle tone. Coordination normal.  Skin: Skin is warm and dry. Capillary refill takes less than 2 seconds. No rash noted. She is not diaphoretic. No erythema. No pallor.  Psychiatric: She has a normal mood and affect. Her behavior is normal. Judgment and thought content normal.  Nursing note and vitals reviewed.      Assessment & Plan:

## 2018-03-26 DIAGNOSIS — K648 Other hemorrhoids: Secondary | ICD-10-CM | POA: Diagnosis not present

## 2018-03-26 DIAGNOSIS — Z9049 Acquired absence of other specified parts of digestive tract: Secondary | ICD-10-CM | POA: Diagnosis not present

## 2018-03-26 DIAGNOSIS — K5790 Diverticulosis of intestine, part unspecified, without perforation or abscess without bleeding: Secondary | ICD-10-CM | POA: Diagnosis not present

## 2018-03-26 DIAGNOSIS — K5904 Chronic idiopathic constipation: Secondary | ICD-10-CM | POA: Diagnosis not present

## 2018-04-08 ENCOUNTER — Encounter: Payer: Self-pay | Admitting: Adult Health

## 2018-04-08 ENCOUNTER — Ambulatory Visit (INDEPENDENT_AMBULATORY_CARE_PROVIDER_SITE_OTHER): Payer: BLUE CROSS/BLUE SHIELD | Admitting: Adult Health

## 2018-04-08 VITALS — BP 118/78 | Temp 98.5°F | Ht 66.0 in | Wt 182.0 lb

## 2018-04-08 DIAGNOSIS — I10 Essential (primary) hypertension: Secondary | ICD-10-CM

## 2018-04-08 DIAGNOSIS — E039 Hypothyroidism, unspecified: Secondary | ICD-10-CM

## 2018-04-08 DIAGNOSIS — E782 Mixed hyperlipidemia: Secondary | ICD-10-CM | POA: Diagnosis not present

## 2018-04-08 DIAGNOSIS — Z0000 Encounter for general adult medical examination without abnormal findings: Secondary | ICD-10-CM | POA: Diagnosis not present

## 2018-04-08 DIAGNOSIS — G479 Sleep disorder, unspecified: Secondary | ICD-10-CM

## 2018-04-08 LAB — CBC WITH DIFFERENTIAL/PLATELET
Basophils Absolute: 0.1 10*3/uL (ref 0.0–0.1)
Basophils Relative: 0.9 % (ref 0.0–3.0)
EOS PCT: 2.2 % (ref 0.0–5.0)
Eosinophils Absolute: 0.2 10*3/uL (ref 0.0–0.7)
HCT: 42.3 % (ref 36.0–46.0)
Hemoglobin: 14.4 g/dL (ref 12.0–15.0)
Lymphocytes Relative: 35.3 % (ref 12.0–46.0)
Lymphs Abs: 2.4 10*3/uL (ref 0.7–4.0)
MCHC: 34.2 g/dL (ref 30.0–36.0)
MCV: 88.3 fl (ref 78.0–100.0)
MONOS PCT: 4.1 % (ref 3.0–12.0)
Monocytes Absolute: 0.3 10*3/uL (ref 0.1–1.0)
Neutro Abs: 4 10*3/uL (ref 1.4–7.7)
Neutrophils Relative %: 57.5 % (ref 43.0–77.0)
Platelets: 292 10*3/uL (ref 150.0–400.0)
RBC: 4.79 Mil/uL (ref 3.87–5.11)
RDW: 13.7 % (ref 11.5–15.5)
WBC: 6.9 10*3/uL (ref 4.0–10.5)

## 2018-04-08 LAB — BASIC METABOLIC PANEL
BUN: 11 mg/dL (ref 6–23)
CO2: 32 mEq/L (ref 19–32)
CREATININE: 0.95 mg/dL (ref 0.40–1.20)
Calcium: 10.2 mg/dL (ref 8.4–10.5)
Chloride: 99 mEq/L (ref 96–112)
GFR: 79.93 mL/min (ref >60.00)
Glucose, Bld: 122 mg/dL — ABNORMAL HIGH (ref 70–99)
POTASSIUM: 3.3 meq/L — AB (ref 3.5–5.1)
Sodium: 138 mEq/L (ref 135–145)

## 2018-04-08 LAB — HEPATIC FUNCTION PANEL
ALT: 26 U/L (ref 0–35)
AST: 17 U/L (ref 0–37)
Albumin: 4.6 g/dL (ref 3.5–5.2)
Alkaline Phosphatase: 95 U/L (ref 39–117)
Bilirubin, Direct: 0.1 mg/dL (ref 0.0–0.3)
Total Bilirubin: 1.2 mg/dL (ref 0.2–1.2)
Total Protein: 7.5 g/dL (ref 6.0–8.3)

## 2018-04-08 LAB — LIPID PANEL
Cholesterol: 248 mg/dL — ABNORMAL HIGH (ref 0–200)
HDL: 27.7 mg/dL — ABNORMAL LOW (ref >39.00)
NonHDL: 219.8
Total CHOL/HDL Ratio: 9
Triglycerides: 243 mg/dL — ABNORMAL HIGH (ref 0.0–149.0)
VLDL: 48.6 mg/dL — ABNORMAL HIGH (ref 0.0–40.0)

## 2018-04-08 LAB — TSH: TSH: 2.37 u[IU]/mL (ref 0.35–4.50)

## 2018-04-08 LAB — LDL CHOLESTEROL, DIRECT: Direct LDL: 180 mg/dL

## 2018-04-08 NOTE — Progress Notes (Signed)
Subjective:    Patient ID: [name], female    DOB: July 23, 1965, 52 y.o.   MRN: [id number]  HPI Patient presents for yearly preventative medicine examination. She is a pleasant 52 year old female who  has a past medical history of Bell's palsy, Essential hypertension, External hemorrhoids, History of pneumonia, Hyperlipidemia, Hyperthyroidism, Iron deficiency anemia, Migraine headache, PPD positive, Pulmonary nodules, and PVC's (premature ventricular contractions).  Essential Hypertension - She takes Cozaar 100 mg and hydrochlorothiazide 25 mg daily  BP Readings from Last 3 Encounters:  04/08/18 118/78  10/15/17 134/88  10/01/17 116/80   Hypothyroidism - currently prescribed synthroid 100 mcg daily  Lab Results  Component Value Date   TSH 2.16 02/27/2017   Migraines-Imitrex as needed  All immunizations and health maintenance protocols were reviewed with the patient and needed orders were placed. She refuses flu vaccination and tdap.   Appropriate screening laboratory values were ordered for the patient including screening of hyperlipidemia, renal function and hepatic function.  Medication reconciliation,  past medical history, social history, problem list and allergies were reviewed in detail with the patient  Goals were established with regard to weight loss, exercise, and  diet in compliance with medications. She has not been eating healthy nor exercising on a regular basis.  She currently finds it difficult to find time for lifestyle modifications due to her busy schedule at work [employer] Readings from Last 3 Encounters:  04/08/18 182 lb (82.6 kg)  10/15/17 175 lb (79.4 kg)  10/01/17 177 lb (80.3 kg)   Her only acute complaint today is that of difficulty sleeping.  This is been present for approximately 6 to 8 months.  She has not tried any over-the-counter medications.  Reports that she often sleeps in 2-hour intervals and finds it difficult to fall back asleep.  She does snore  and has had a home sleep study which showed OSA but she refuses to go on a CPAP.  She denies waking up gasping for breath  Review of Systems  Constitutional: Negative.   HENT: Negative.   Eyes: Negative.   Respiratory: Negative.   Cardiovascular: Negative.   Gastrointestinal: Negative.   Endocrine: Negative.   Genitourinary: Negative.   Musculoskeletal: Negative.   Skin: Negative.   Allergic/Immunologic: Negative.   Neurological: Negative.   Hematological: Negative.   Psychiatric/Behavioral: Positive for sleep disturbance.  All other systems reviewed and are negative.  Past Medical History:  Diagnosis Date  . Bell's palsy    with res left lag  . Essential hypertension   . External hemorrhoids   . History of pneumonia   . Hyperlipidemia   . Hyperthyroidism    hx 52 years old- oral meds  . Iron deficiency anemia   . Migraine headache   . PPD positive   . Pulmonary nodules    not seen on plain cxr, first detected 1/07 re ct 10/08 pos IPPD > 15 mm 12/09 FOB/lavage 04/20/08 neg afb smear  . PVC's (premature ventricular contractions)     Social History   Socioeconomic History  . Marital status: Divorced    Spouse name: Not on file  . Number of children: 2  . Years of education: Not on file  . Highest education level: Not on file  Occupational History  . Occupation: accounts Sport and exercise psychologist: [employer]  Social Needs  . Financial resource strain: Not on file  . Food insecurity:    Worry: Not on file    Inability: Not  on file  . Transportation needs:    Medical: Not on file    Non-medical: Not on file  Tobacco Use  . Smoking status: Never Smoker  . Smokeless tobacco: Never Used  Substance and Sexual Activity  . Alcohol use: No    Alcohol/week: 0.0 standard drinks  . Drug use: No  . Sexual activity: Not on file  Lifestyle  . Physical activity:    Days per week: Not on file    Minutes per session: Not on file  . Stress: Not on file  Relationships  . Social  connections:    Talks on phone: Not on file    Gets together: Not on file    Attends religious service: Not on file    Active member of club or organization: Not on file    Attends meetings of clubs or organizations: Not on file    Relationship status: Not on file  . Intimate partner violence:    Fear of current or ex partner: Not on file    Emotionally abused: Not on file    Physically abused: Not on file    Forced sexual activity: Not on file  Other Topics Concern  . Not on file  Social History Narrative   Divorced with children   She works in Press photographer    Possible TB exposure 2008 (after nodules found)   From [place] lived there and [place]   Never owned birds       Past Surgical History:  Procedure Laterality Date  . ABDOMINAL HYSTERECTOMY    . BREAST BIOPSY Right   . CHOLECYSTECTOMY  2014  . COLONOSCOPY  05/04/2009   prolapsing external hemorrhoids  . ENDOMETRIAL ABLATION    . HEMORRHOID SURGERY     x 3  . TUBAL LIGATION      Family History  Problem Relation Age of Onset  . Heart disease Maternal Grandmother        aunts, uncles, sister  . Diabetes Mellitus II Maternal Grandmother   . Kidney failure Sister   . Diabetes Mellitus II Sister   . Lung cancer Unknown        aunts and uncles  . Colon cancer Maternal Aunt        aunts and uncles  . Diabetes Mellitus II Maternal Aunt   . Colon cancer Maternal Uncle   . Diabetes Mellitus II Maternal Uncle   . Diabetes type II Mother     No Known Allergies  Current Outpatient Medications on File Prior to Visit  Medication Sig Dispense Refill  . aspirin-acetaminophen-caffeine (EXCEDRIN EXTRA STRENGTH) 250-250-65 MG per tablet Take 1 tablet by mouth every 6 (six) hours as needed (for migraines).     . fluticasone (FLONASE) 50 MCG/ACT nasal spray Place 2 sprays into both nostrils daily. 16 g 6  . hydrochlorothiazide (HYDRODIURIL) 25 MG tablet Take 1 tablet (25 mg total) by mouth daily. 90 tablet 0  . levothyroxine  (SYNTHROID, LEVOTHROID) 100 MCG tablet Take 1 tablet (100 mcg total) by mouth daily. 90 tablet 3  . losartan (COZAAR) 100 MG tablet Take 1 tablet (100 mg total) by mouth daily. 90 tablet 0  . SUMAtriptan (IMITREX) 50 MG tablet Take 1 tablet (50 mg total) by mouth every 2 (two) hours as needed for migraine. May repeat in 2 hours if headache persists or recurs. 10 tablet 1   No current facility-administered medications on file prior to visit.     BP 118/78   Temp 98.5 F (  36.9 C)   Ht 5\' 6"  (1.676 m)   Wt 182 lb (82.6 kg)   BMI 29.38 kg/m       Objective:   Physical Exam  Constitutional: She is oriented to person, place, and time. She appears well-developed and well-nourished. No distress.  HENT:  Head: Normocephalic and atraumatic.  Right Ear: External ear normal.  Left Ear: External ear normal.  Nose: Nose normal.  Mouth/Throat: Oropharynx is clear and moist. No oropharyngeal exudate.  Eyes: Pupils are equal, round, and reactive to light. Conjunctivae and EOM are normal. Right eye exhibits no discharge. Left eye exhibits no discharge. No scleral icterus.  Neck: Normal range of motion. Neck supple. No JVD present. No tracheal deviation present. No thyromegaly present.  Cardiovascular: Normal rate, regular rhythm, normal heart sounds and intact distal pulses. Exam reveals no gallop and no friction rub.  No murmur heard. Pulmonary/Chest: Effort normal and breath sounds normal. No stridor. No respiratory distress. She has no wheezes. She has no rales. She exhibits no tenderness.  Abdominal: Soft. Bowel sounds are normal. She exhibits no distension and no mass. There is no tenderness. There is no rebound and no guarding. No hernia.  Musculoskeletal: Normal range of motion. She exhibits no edema, tenderness or deformity.  Lymphadenopathy:    She has no cervical adenopathy.  Neurological: She is alert and oriented to person, place, and time. She displays normal reflexes. No cranial nerve  deficit or sensory deficit. She exhibits normal muscle tone. Coordination normal.  Skin: Skin is warm and dry. Capillary refill takes less than 2 seconds. No rash noted. She is not diaphoretic. No erythema. No pallor.  Psychiatric: She has a normal mood and affect. Her behavior is normal. Judgment and thought content normal.  Nursing note and vitals reviewed.     Assessment & Plan:  1. Routine general medical examination at a health care facility -Encouraged to make time for lifestyle modifications.  I would like her to exercise for at least 60 minutes 3 times a week and eat a heart healthy diet.  -Follow-up in 1 year for CPE or sooner if needed - Basic metabolic panel - CBC with Differential/Platelet - Hepatic function panel - Lipid panel - TSH  2. Hypothyroidism, unspecified type - Consider change in synthroid  - Basic metabolic panel - CBC with Differential/Platelet - Hepatic function panel - Lipid panel - TSH  3. Mixed hyperlipidemia -Consider statin - Basic metabolic panel - CBC with Differential/Platelet - Hepatic function panel - Lipid panel - TSH  4. Essential hypertension -Well-controlled.  No change in medication - Basic metabolic panel - CBC with Differential/Platelet - Hepatic function panel - Lipid panel - TSH  5. Sleep disorder - Will have her trial melatonin 3-5 mg QHS PRN   [name], NP

## 2018-04-10 ENCOUNTER — Other Ambulatory Visit: Payer: Self-pay | Admitting: Family Medicine

## 2018-04-10 MED ORDER — ATORVASTATIN CALCIUM 20 MG PO TABS: 20.0000 mg | ORAL_TABLET | Freq: Every day | ORAL | 3 refills | Status: DC

## 2018-04-15 ENCOUNTER — Encounter: Payer: Self-pay | Admitting: Family Medicine

## 2018-04-16 ENCOUNTER — Ambulatory Visit: Payer: BLUE CROSS/BLUE SHIELD

## 2018-04-24 ENCOUNTER — Other Ambulatory Visit: Payer: Self-pay | Admitting: Adult Health

## 2018-04-27 NOTE — Telephone Encounter (Signed)
Sent to the pharmacy by e-scribe. 

## 2018-05-15 ENCOUNTER — Ambulatory Visit: Payer: BLUE CROSS/BLUE SHIELD

## 2018-06-12 DIAGNOSIS — D123 Benign neoplasm of transverse colon: Secondary | ICD-10-CM | POA: Diagnosis not present

## 2018-06-12 DIAGNOSIS — Z1211 Encounter for screening for malignant neoplasm of colon: Secondary | ICD-10-CM | POA: Diagnosis not present

## 2018-06-25 ENCOUNTER — Ambulatory Visit (INDEPENDENT_AMBULATORY_CARE_PROVIDER_SITE_OTHER): Payer: BLUE CROSS/BLUE SHIELD | Admitting: Adult Health

## 2018-06-25 ENCOUNTER — Encounter: Payer: Self-pay | Admitting: Adult Health

## 2018-06-25 VITALS — BP 126/80 | HR 87 | Temp 98.2°F | Wt 179.0 lb

## 2018-06-25 DIAGNOSIS — J069 Acute upper respiratory infection, unspecified: Secondary | ICD-10-CM

## 2018-06-25 MED ORDER — PREDNISONE 10 MG PO TABS: ORAL_TABLET | ORAL | 0 refills | Status: DC

## 2018-06-25 MED ORDER — HYDROCODONE-HOMATROPINE 5-1.5 MG/5ML PO SYRP: 5.0000 mL | ORAL_SOLUTION | Freq: Three times a day (TID) | ORAL | 0 refills | Status: DC | PRN

## 2018-06-25 MED ORDER — DOXYCYCLINE HYCLATE 100 MG PO CAPS: 100.0000 mg | ORAL_CAPSULE | Freq: Two times a day (BID) | ORAL | 0 refills | Status: DC

## 2018-06-25 NOTE — Progress Notes (Signed)
Subjective:    Patient ID: [name], female    DOB: [birth year], 53 y.o.   MRN: [id number]  URI   This is a new problem. The current episode started in the past 7 days. The problem has been rapidly worsening. There has been no fever. Associated symptoms include congestion, coughing, headaches, rhinorrhea, sinus pain and a sore throat. Pertinent negatives include no dysuria, vomiting or wheezing. She has tried acetaminophen and decongestant for the symptoms. The treatment provided no relief.      Review of Systems  Constitutional: Positive for appetite change and fatigue. Negative for activity change, diaphoresis and fever.  HENT: Positive for congestion, rhinorrhea, sinus pressure, sinus pain and sore throat. Negative for trouble swallowing.   Respiratory: Positive for cough and chest tightness. Negative for shortness of breath and wheezing.   Cardiovascular: Negative.   Gastrointestinal: Negative for vomiting.  Genitourinary: Negative for dysuria.  Skin: Negative.   Neurological: Positive for headaches.  All other systems reviewed and are negative.  Past Medical History:  Diagnosis Date  . Bell's palsy    with res left lag  . Essential hypertension   . External hemorrhoids   . History of pneumonia   . Hyperlipidemia   . Hyperthyroidism    hx 53 years old- oral meds  . Iron deficiency anemia   . Migraine headache   . PPD positive   . Pulmonary nodules    not seen on plain cxr, first detected 1/07 re ct 10/08 pos IPPD > 15 mm 12/09 FOB/lavage 04/20/08 neg afb smear  . PVC's (premature ventricular contractions)     Social History   Socioeconomic History  . Marital status: Divorced    Spouse name: Not on file  . Number of children: 2  . Years of education: Not on file  . Highest education level: Not on file  Occupational History  . Occupation: accounts Sport and exercise psychologist: [employer]  Social Needs  . Financial resource strain: Not on file  . Food insecurity:    Worry: Not on file    Inability: Not on file  . Transportation needs:    Medical: Not on file    Non-medical: Not on file  Tobacco Use  . Smoking status: Never Smoker  . Smokeless tobacco: Never Used  Substance and Sexual Activity  . Alcohol use: No    Alcohol/week: 0.0 standard drinks  . Drug use: No  . Sexual activity: Not on file  Lifestyle  . Physical activity:    Days per week: Not on file    Minutes per session: Not on file  . Stress: Not on file  Relationships  . Social connections:    Talks on phone: Not on file    Gets together: Not on file    Attends religious service: Not on file    Active member of club or organization: Not on file    Attends meetings of clubs or organizations: Not on file    Relationship status: Not on file  . Intimate partner violence:    Fear of current or ex partner: Not on file    Emotionally abused: Not on file    Physically abused: Not on file    Forced sexual activity: Not on file  Other Topics Concern  . Not on file  Social History Narrative   Divorced with children   She works in Press photographer    Possible TB exposure 2008 (after nodules found)   From [place] lived  there and [place]   Never owned birds       Past Surgical History:  Procedure Laterality Date  . ABDOMINAL HYSTERECTOMY    . BREAST BIOPSY Right   . CHOLECYSTECTOMY  2014  . COLONOSCOPY  05/04/2009   prolapsing external hemorrhoids  . ENDOMETRIAL ABLATION    . HEMORRHOID SURGERY     x 3  . TUBAL LIGATION      Family History  Problem Relation Age of Onset  . Heart disease Maternal Grandmother        aunts, uncles, sister  . Diabetes Mellitus II Maternal Grandmother   . Kidney failure Sister   . Diabetes Mellitus II Sister   . Lung cancer Unknown        aunts and uncles  . Colon cancer Maternal Aunt        aunts and uncles  . Diabetes Mellitus II Maternal Aunt   . Colon cancer Maternal Uncle   . Diabetes Mellitus II Maternal Uncle   . Diabetes type II Mother      No Known Allergies  Current Outpatient Medications on File Prior to Visit  Medication Sig Dispense Refill  . aspirin-acetaminophen-caffeine (EXCEDRIN EXTRA STRENGTH) 250-250-65 MG per tablet Take 1 tablet by mouth every 6 (six) hours as needed (for migraines).     Marland Kitchen atorvastatin (LIPITOR) 20 MG tablet Take 1 tablet (20 mg total) by mouth daily. 90 tablet 3  . hydrochlorothiazide (HYDRODIURIL) 25 MG tablet Take 1 tablet (25 mg total) by mouth daily. 90 tablet 0  . levothyroxine (SYNTHROID, LEVOTHROID) 100 MCG tablet TAKE 1 TABLET BY MOUTH EVERY DAY 90 tablet 3  . losartan (COZAAR) 100 MG tablet Take 1 tablet (100 mg total) by mouth daily. 90 tablet 0  . Polyethylene Glycol 3350 (MIRALAX PO) Take by mouth.    . SUMAtriptan (IMITREX) 50 MG tablet Take 1 tablet (50 mg total) by mouth every 2 (two) hours as needed for migraine. May repeat in 2 hours if headache persists or recurs. 10 tablet 1   No current facility-administered medications on file prior to visit.     BP 126/80   Pulse 87   Temp 98.2 F (36.8 C)   Wt 179 lb (81.2 kg)   SpO2 98%   BMI 28.89 kg/m       Objective:   Physical Exam Vitals signs and nursing note reviewed.  Constitutional:      Appearance: Normal appearance. She is ill-appearing.  HENT:     Right Ear: Tympanic membrane and ear canal normal.     Left Ear: Tympanic membrane and ear canal normal.     Nose: Congestion and rhinorrhea present.     Right Turbinates: Enlarged and swollen.     Left Turbinates: Enlarged and swollen.     Right Sinus: Maxillary sinus tenderness and frontal sinus tenderness present.     Left Sinus: Maxillary sinus tenderness and frontal sinus tenderness present.     Mouth/Throat:     Mouth: Mucous membranes are moist.     Pharynx: Oropharynx is clear.     Tonsils: No tonsillar exudate. Swelling: 0 on the right. 0 on the left.  Cardiovascular:     Rate and Rhythm: Normal rate and regular rhythm.     Pulses: Normal pulses.      Heart sounds: Normal heart sounds.  Pulmonary:     Effort: Pulmonary effort is normal.     Breath sounds: No stridor. No wheezing or rhonchi.  Skin:  General: Skin is warm and dry.     Capillary Refill: Capillary refill takes less than 2 seconds.  Neurological:     General: No focal deficit present.     Mental Status: She is alert and oriented to person, place, and time.  Psychiatric:        Mood and Affect: Mood normal.        Behavior: Behavior normal.        Thought Content: Thought content normal.        Judgment: Judgment normal.       Assessment & Plan:  1. Upper respiratory tract infection, unspecified type - doxycycline (VIBRAMYCIN) 100 MG capsule; Take 1 capsule (100 mg total) by mouth 2 (two) times daily.  Dispense: 14 capsule; Refill: 0 - predniSONE (DELTASONE) 10 MG tablet; 40 mg x 3 days, 20 mg x 3 days, 10 mg x 3 days  Dispense: 21 tablet; Refill: 0 - HYDROcodone-homatropine (HYCODAN) 5-1.5 MG/5ML syrup; Take 5 mLs by mouth every 8 (eight) hours as needed for cough.  Dispense: 120 mL; Refill: 0 - stay hydrated and rest  - Follow up in 2-3 days if not resolved or sooner if needed  [name], NP

## 2018-06-30 ENCOUNTER — Ambulatory Visit: Payer: Self-pay | Admitting: *Deleted

## 2018-06-30 NOTE — Telephone Encounter (Signed)
Message from Adelene Idler sent at 06/30/2018 10:34 AM EST   Patient was seen on 06/25/18 and since her visit she has became hoarse she wants to know what she can take other than meds given to help her   Called patient back regarding what to take for hoarseness. She is still taking her medications that were prescribed for her at the office visit. Advised that she try drinking hot tea with lemon and honey, rest her voice and make sure she finishes the steroids. Pt voiced understanding. Also advised that she could speak with her pharmacist to see what else she could take.  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  Routing to flow at [facility] [place] for review and other suggestions.  Call back with any other concerns.

## 2018-07-11 ENCOUNTER — Other Ambulatory Visit: Payer: Self-pay | Admitting: Adult Health

## 2018-07-14 NOTE — Telephone Encounter (Signed)
SENT TO THE PHARMACY BY E-SCRIBE. 

## 2018-07-21 ENCOUNTER — Telehealth: Payer: Self-pay | Admitting: Adult Health

## 2018-07-21 NOTE — Telephone Encounter (Signed)
Spoke to the pt.  She completed cough syrup, antibiotic and prednisone.  The cough continues and is the same as before.  No better or worse.  Pt sounds very hoarse and coughing while on the phone.  She denies SOB.  She says she has a "seal bark" at night.  Also notes that she has coughed so much that her chest hurts.  No denied any other symptoms.  She has not traveled.  Requesting another round of prednisone and cough syrup.  Please advise.

## 2018-07-21 NOTE — Telephone Encounter (Signed)
Pt now scheduled for 07/22/2018 @ 10:30.

## 2018-07-21 NOTE — Telephone Encounter (Signed)
Copied from [place] [id number]. Topic: General - Other >> Jul 21, 2018 12:06 PM Keene Breath wrote: Reason for CRM: Patient called to inform the doctor that she is still coughing very much and cannot get sleep.  The medication he prescribed is not working well.  Please advise and call her back at [phone]

## 2018-07-21 NOTE — Telephone Encounter (Signed)
Symptoms have been going on for about a month. Please have her follow up for reevaluation

## 2018-07-22 ENCOUNTER — Ambulatory Visit (INDEPENDENT_AMBULATORY_CARE_PROVIDER_SITE_OTHER): Payer: BLUE CROSS/BLUE SHIELD

## 2018-07-22 ENCOUNTER — Encounter: Payer: Self-pay | Admitting: Adult Health

## 2018-07-22 ENCOUNTER — Other Ambulatory Visit: Payer: Self-pay

## 2018-07-22 ENCOUNTER — Ambulatory Visit (INDEPENDENT_AMBULATORY_CARE_PROVIDER_SITE_OTHER): Payer: BLUE CROSS/BLUE SHIELD | Admitting: Adult Health

## 2018-07-22 VITALS — BP 150/96 | Temp 98.3°F | Wt 183.0 lb

## 2018-07-22 DIAGNOSIS — R059 Cough, unspecified: Secondary | ICD-10-CM

## 2018-07-22 DIAGNOSIS — R05 Cough: Secondary | ICD-10-CM

## 2018-07-22 MED ORDER — HYDROCODONE-HOMATROPINE 5-1.5 MG/5ML PO SYRP: 5.0000 mL | ORAL_SOLUTION | Freq: Three times a day (TID) | ORAL | 0 refills | Status: DC | PRN

## 2018-07-22 MED ORDER — PREDNISONE 50 MG PO TABS: ORAL_TABLET | ORAL | 0 refills | Status: DC

## 2018-07-22 NOTE — Progress Notes (Signed)
Subjective:    Patient ID: [name], female    DOB: August 11, 1965, 53 y.o.   MRN: [id number]  HPI 53 year old female who  has a past medical history of Bell's palsy, Essential hypertension, External hemorrhoids, History of pneumonia, Hyperlipidemia, Hyperthyroidism, Iron deficiency anemia, Migraine headache, PPD positive, Pulmonary nodules, and PVC's (premature ventricular contractions).  She presents to the office today for follow up. She was seen on 06/25/18 for perceived URI. She was treated with a course of Doxycycline, prednisone taper and Hycodan cough syrup. She reports that while taking this course of medication she started to feel better but cough has not subsided completely and is now starting to get worse. Cough continues to be a dry barking cough   She denies fevers, chills, SOB, wheezing, sinus pain or pressure.   Review of Systems See HPI   Past Medical History:  Diagnosis Date  . Bell's palsy    with res left lag  . Essential hypertension   . External hemorrhoids   . History of pneumonia   . Hyperlipidemia   . Hyperthyroidism    hx 53 years old- oral meds  . Iron deficiency anemia   . Migraine headache   . PPD positive   . Pulmonary nodules    not seen on plain cxr, first detected 1/07 re ct 10/08 pos IPPD > 15 mm 12/09 FOB/lavage 04/20/08 neg afb smear  . PVC's (premature ventricular contractions)     Social History   Socioeconomic History  . Marital status: Divorced    Spouse name: Not on file  . Number of children: 2  . Years of education: Not on file  . Highest education level: Not on file  Occupational History  . Occupation: accounts Sport and exercise psychologist: [employer]  Social Needs  . Financial resource strain: Not on file  . Food insecurity:    Worry: Not on file    Inability: Not on file  . Transportation needs:    Medical: Not on file    Non-medical: Not on file  Tobacco Use  . Smoking status: Never Smoker  . Smokeless tobacco: Never Used   Substance and Sexual Activity  . Alcohol use: No    Alcohol/week: 0.0 standard drinks  . Drug use: No  . Sexual activity: Not on file  Lifestyle  . Physical activity:    Days per week: Not on file    Minutes per session: Not on file  . Stress: Not on file  Relationships  . Social connections:    Talks on phone: Not on file    Gets together: Not on file    Attends religious service: Not on file    Active member of club or organization: Not on file    Attends meetings of clubs or organizations: Not on file    Relationship status: Not on file  . Intimate partner violence:    Fear of current or ex partner: Not on file    Emotionally abused: Not on file    Physically abused: Not on file    Forced sexual activity: Not on file  Other Topics Concern  . Not on file  Social History Narrative   Divorced with children   She works in Press photographer    Possible TB exposure 2008 (after nodules found)   From [place] lived there and College   Never owned birds       Past Surgical History:  Procedure Laterality Date  . ABDOMINAL HYSTERECTOMY    .  BREAST BIOPSY Right   . CHOLECYSTECTOMY  2014  . COLONOSCOPY  05/04/2009   prolapsing external hemorrhoids  . ENDOMETRIAL ABLATION    . HEMORRHOID SURGERY     x 3  . TUBAL LIGATION      Family History  Problem Relation Age of Onset  . Heart disease Maternal Grandmother        aunts, uncles, sister  . Diabetes Mellitus II Maternal Grandmother   . Kidney failure Sister   . Diabetes Mellitus II Sister   . Lung cancer Unknown        aunts and uncles  . Colon cancer Maternal Aunt        aunts and uncles  . Diabetes Mellitus II Maternal Aunt   . Colon cancer Maternal Uncle   . Diabetes Mellitus II Maternal Uncle   . Diabetes type II Mother     No Known Allergies  Current Outpatient Medications on File Prior to Visit  Medication Sig Dispense Refill  . aspirin-acetaminophen-caffeine (EXCEDRIN EXTRA STRENGTH) 250-250-65 MG per tablet Take 1  tablet by mouth every 6 (six) hours as needed (for migraines).     Marland Kitchen atorvastatin (LIPITOR) 20 MG tablet Take 1 tablet (20 mg total) by mouth daily. 90 tablet 3  . hydrochlorothiazide (HYDRODIURIL) 25 MG tablet TAKE 1 TABLET BY MOUTH EVERY DAY 90 tablet 2  . HYDROcodone-homatropine (HYCODAN) 5-1.5 MG/5ML syrup Take 5 mLs by mouth every 8 (eight) hours as needed for cough. 120 mL 0  . levothyroxine (SYNTHROID, LEVOTHROID) 100 MCG tablet TAKE 1 TABLET BY MOUTH EVERY DAY 90 tablet 3  . losartan (COZAAR) 100 MG tablet TAKE 1 TABLET BY MOUTH EVERY DAY 90 tablet 2  . Polyethylene Glycol 3350 (MIRALAX PO) Take by mouth.    . predniSONE (DELTASONE) 10 MG tablet 40 mg x 3 days, 20 mg x 3 days, 10 mg x 3 days 21 tablet 0  . SUMAtriptan (IMITREX) 50 MG tablet Take 1 tablet (50 mg total) by mouth every 2 (two) hours as needed for migraine. May repeat in 2 hours if headache persists or recurs. 10 tablet 1   No current facility-administered medications on file prior to visit.     BP (!) 150/96   Temp 98.3 F (36.8 C)   Wt 183 lb (83 kg)   BMI 29.54 kg/m       Objective:   Physical Exam Vitals signs and nursing note reviewed.  Constitutional:      Appearance: Normal appearance.  HENT:     Right Ear: Tympanic membrane, ear canal and external ear normal. There is no impacted cerumen.     Left Ear: Tympanic membrane, ear canal and external ear normal. There is no impacted cerumen.     Nose: Nose normal. No congestion or rhinorrhea.     Mouth/Throat:     Mouth: Mucous membranes are moist.  Cardiovascular:     Rate and Rhythm: Normal rate and regular rhythm.     Pulses: Normal pulses.     Heart sounds: Normal heart sounds.  Pulmonary:     Effort: Pulmonary effort is normal. No respiratory distress.     Breath sounds: Normal breath sounds. No stridor. No rhonchi.     Comments: Constant dry barking cough throughout exam   Abdominal:     General: Abdomen is flat.     Palpations: Abdomen is soft.   Musculoskeletal: Normal range of motion.  Skin:    General: Skin is warm and dry.  Neurological:  General: No focal deficit present.     Mental Status: She is alert and oriented to person, place, and time.  Psychiatric:        Mood and Affect: Mood normal.        Behavior: Behavior normal.        Thought Content: Thought content normal.        Judgment: Judgment normal.       Assessment & Plan:  1. Cough - Consider abx  - DG Chest 2 View; Future - predniSONE (DELTASONE) 50 MG tablet; Take daily  Dispense: 10 tablet; Refill: 0 - HYDROcodone-homatropine (HYCODAN) 5-1.5 MG/5ML syrup; Take 5 mLs by mouth every 8 (eight) hours as needed for cough.  Dispense: 120 mL; Refill: 0  [name], NP

## 2018-08-10 ENCOUNTER — Ambulatory Visit: Payer: BLUE CROSS/BLUE SHIELD

## 2018-09-14 ENCOUNTER — Other Ambulatory Visit: Payer: Self-pay | Admitting: Adult Health

## 2018-09-14 ENCOUNTER — Other Ambulatory Visit: Payer: Self-pay

## 2018-09-14 ENCOUNTER — Ambulatory Visit
Admission: RE | Admit: 2018-09-14 | Discharge: 2018-09-14 | Disposition: A | Discharge status: Home / Self Care | Payer: BLUE CROSS/BLUE SHIELD | Source: Ambulatory Visit | Attending: Family Medicine | Admitting: Family Medicine

## 2018-09-14 DIAGNOSIS — Z1231 Encounter for screening mammogram for malignant neoplasm of breast: Secondary | ICD-10-CM | POA: Diagnosis not present

## 2018-09-16 ENCOUNTER — Other Ambulatory Visit: Payer: Self-pay | Admitting: Family Medicine

## 2018-09-16 MED ORDER — LOSARTAN POTASSIUM-HCTZ 100-25 MG PO TABS: 1.0000 | ORAL_TABLET | Freq: Every day | ORAL | 1 refills | Status: DC

## 2018-09-16 NOTE — Telephone Encounter (Signed)
Sent to the pharmacy by e-scribe. 

## 2018-09-21 ENCOUNTER — Ambulatory Visit: Payer: BLUE CROSS/BLUE SHIELD

## 2018-12-01 ENCOUNTER — Telehealth: Payer: Self-pay

## 2018-12-01 DIAGNOSIS — G43719 Chronic migraine without aura, intractable, without status migrainosus: Secondary | ICD-10-CM

## 2018-12-01 NOTE — Telephone Encounter (Signed)
Copied from [place] [id number]. Topic: Appointment Scheduling - Scheduling Inquiry for Clinic >> Dec 01, 2018  3:51 PM Richardo Priest, [place] wrote: Reason for CRM: Patient called in stating she is needing her medication for her migraines. Patient had it last filled last year. Advised her appointment is needed. Please advise. Call back is [phone].

## 2018-12-02 MED ORDER — SUMATRIPTAN SUCCINATE 50 MG PO TABS: 50.0000 mg | ORAL_TABLET | ORAL | 0 refills | Status: DC | PRN

## 2018-12-02 NOTE — Telephone Encounter (Signed)
Sent to the pharmacy by e-scribe. 

## 2018-12-19 ENCOUNTER — Telehealth: Payer: Self-pay | Admitting: Adult Health

## 2018-12-23 NOTE — Telephone Encounter (Signed)
Pt is currently taking losartan-hydrochlorothiazide (HYZAAR) 100-25 MG tablet.  Pharmacy would like to change to valsartan-hctz because it will cost the pt $2 instead of $19.  Please advise.

## 2018-12-23 NOTE — Telephone Encounter (Signed)
That is fine to send in for 90 days.  Will want to follow up in 2 weeks for BP check.

## 2018-12-23 NOTE — Telephone Encounter (Signed)
I spoke to the [name] to inform her that [name] has agreed to change medication.  [name] tells me that she has not requested the change.  Informed her that maybe the pharmacy did it on her behalf since it would be cheaper.  [name] stated she does not want to change.  Doing well on current med. Message sent to the pharmacy.  " DENIED.  [name] DOES NOT WANT TO CHANGE MEDICATION AT THIS TIME."

## 2018-12-23 NOTE — Telephone Encounter (Signed)
I have her taking Hyzaar currently?

## 2018-12-29 NOTE — Telephone Encounter (Signed)
Electronic request was received.  Request would have had to come from the pharmacy.  Nothing further needed at this time.

## 2018-12-29 NOTE — Telephone Encounter (Signed)
Pt called back in to update provider. Pt says that spoke with her pharmacy and was advised by them that no one sent request to office.

## 2019-03-16 ENCOUNTER — Other Ambulatory Visit: Payer: Self-pay | Admitting: Adult Health

## 2019-03-18 NOTE — Telephone Encounter (Signed)
Sent to the pharmacy by e-scribe. 

## 2019-05-28 ENCOUNTER — Telehealth (INDEPENDENT_AMBULATORY_CARE_PROVIDER_SITE_OTHER): Payer: BC Managed Care – PPO | Admitting: Adult Health

## 2019-05-28 ENCOUNTER — Other Ambulatory Visit: Payer: Self-pay

## 2019-05-28 ENCOUNTER — Encounter: Payer: Self-pay | Admitting: Adult Health

## 2019-05-28 VITALS — Ht 66.0 in | Wt 156.0 lb

## 2019-05-28 DIAGNOSIS — R05 Cough: Secondary | ICD-10-CM | POA: Diagnosis not present

## 2019-05-28 DIAGNOSIS — R059 Cough, unspecified: Secondary | ICD-10-CM

## 2019-05-28 MED ORDER — HYDROCODONE-HOMATROPINE 5-1.5 MG/5ML PO SYRP: 5.0000 mL | ORAL_SOLUTION | Freq: Three times a day (TID) | ORAL | 0 refills | Status: DC | PRN

## 2019-05-28 MED ORDER — PREDNISONE 10 MG PO TABS: ORAL_TABLET | ORAL | 0 refills | Status: DC

## 2019-05-28 NOTE — Progress Notes (Signed)
Virtual Visit via Telephone Note  I connected with [name] on 05/28/19 at  3:30 PM EST by telephone and verified that I am speaking with the correct person using two identifiers.   I discussed the limitations, risks, security and privacy concerns of performing an evaluation and management service by telephone and the availability of in person appointments. I also discussed with the patient that there may be a patient responsible charge related to this service. The patient expressed understanding and agreed to proceed.  Location patient: home Location provider: work or home office Participants present for the call: patient, provider Patient did not have a visit in the prior 7 days to address this/these issue(s).   History of Present Illness: 54 year old female who is being evaluated today for an acute on chronic issue.  Every year in the winter she develops a dry barking cough.  Unfortunately, this year is no different.  Her cough has progressed over the last 24 hours.  She denies productive cough, fevers, chills, sinus pain and pressure, shortness of breath, loss of taste or smell, body aches, recent exposure to Covid, and nobody in the house is sick with the same symptoms.  He feels as though the cough is "moving down into my chest".  Not have a burning sensation and she has not noticed any wheezing  In the past prednisone and getting cough syrup is helped her   Observations/Objective: Patient sounds cheerful and well on the phone. I do not appreciate any SOB. Dry cough noticed Speech and thought processing are grossly intact. Patient reported vitals:  Assessment and Plan: 1. Cough -Ackley viral cough.  Will prescribe Hycodan cough syrup and prednisone as this is worked well for her in the past.  Advised to put humidifier at bedside.  Follow-up if no improvement or if symptoms become worse - HYDROcodone-homatropine (HYCODAN) 5-1.5 MG/5ML syrup; Take 5 mLs by mouth every 8 (eight)  hours as needed for cough.  Dispense: 120 mL; Refill: 0 - predniSONE (DELTASONE) 10 MG tablet; 40 mg x 3 days, 20 mg x 3 days, 10 mg x 3 days  Dispense: 21 tablet; Refill: 0   Follow Up Instructions:   I did not refer this patient for an OV in the next 24 hours for this/these issue(s).  I discussed the assessment and treatment plan with the patient. The patient was provided an opportunity to ask questions and all were answered. The patient agreed with the plan and demonstrated an understanding of the instructions.   The patient was advised to call back or seek an in-person evaluation if the symptoms worsen or if the condition fails to improve as anticipated.  I provided 20 minutes of non-face-to-face time during this encounter.   [name], NP

## 2019-06-03 ENCOUNTER — Other Ambulatory Visit: Payer: Self-pay | Admitting: Adult Health

## 2019-06-14 ENCOUNTER — Other Ambulatory Visit: Payer: Self-pay | Admitting: Adult Health

## 2019-06-16 DIAGNOSIS — K5909 Other constipation: Secondary | ICD-10-CM | POA: Diagnosis not present

## 2019-06-16 DIAGNOSIS — R1011 Right upper quadrant pain: Secondary | ICD-10-CM | POA: Diagnosis not present

## 2019-06-16 DIAGNOSIS — R1031 Right lower quadrant pain: Secondary | ICD-10-CM | POA: Diagnosis not present

## 2019-06-16 DIAGNOSIS — Z86010 Personal history of colonic polyps: Secondary | ICD-10-CM | POA: Diagnosis not present

## 2019-06-29 ENCOUNTER — Other Ambulatory Visit: Payer: Self-pay | Admitting: Adult Health

## 2019-06-29 ENCOUNTER — Telehealth (INDEPENDENT_AMBULATORY_CARE_PROVIDER_SITE_OTHER): Payer: BC Managed Care – PPO | Admitting: Adult Health

## 2019-06-29 ENCOUNTER — Other Ambulatory Visit: Payer: Self-pay

## 2019-06-29 ENCOUNTER — Encounter: Payer: Self-pay | Admitting: Adult Health

## 2019-06-29 VITALS — Temp 98.4°F

## 2019-06-29 DIAGNOSIS — Z20822 Contact with and (suspected) exposure to covid-19: Secondary | ICD-10-CM | POA: Diagnosis not present

## 2019-06-29 NOTE — Progress Notes (Signed)
I have discussed the procedure for the virtual visit with the patient who has given consent to proceed with assessment and treatment.  ° °Kathryn A Nelson, CMA ° ° ° ° °

## 2019-06-29 NOTE — Progress Notes (Signed)
Virtual Visit via Video Note  I connected with [name]  on 06/29/19 at  by a video enabled telemedicine application and verified that I am speaking with the correct person using two identifiers.  Location patient: home Location provider:work or home office Persons participating in the virtual visit: patient, provider  I discussed the limitations of evaluation and management by telemedicine and the availability of in person appointments. The patient expressed understanding and agreed to proceed.   HPI: 54 year old female who is being evaluated today for concern of upper respiratory infection.  She was originally seen on 05/28/2019 for a dry barking cough.  She usually gets this once a year.  At this time she denied productive cough, fevers, chills, fatigue, shortness of breath, loss of taste or smell, body aches, recent Covid exposure.  No one in her home was sick.  She was prescribed a prednisone taper  Today she reports that she completed her prednisone taper but her symptoms have failed to improve and have actually gotten worse.  She reports 4 nights ago she had a fever and body aches but that has since resolved.  She has loss of appetite, fatigue, continues with a dry constant cough.  She denies loss of taste or smell.  She currently reports that 2 family members in her household have similar symptoms and one once tested 4 days ago for Covid but has not gotten her results back.   ROS: See pertinent positives and negatives per HPI.  Past Medical History:  Diagnosis Date  . Bell's palsy    with res left lag  . Essential hypertension   . External hemorrhoids   . History of pneumonia   . Hyperlipidemia   . Hyperthyroidism    hx 54 years old- oral meds  . Iron deficiency anemia   . Migraine headache   . PPD positive   . Pulmonary nodules    not seen on plain cxr, first detected 1/07 re ct 10/08 pos IPPD > 15 mm 12/09 FOB/lavage 04/20/08 neg afb smear  . PVC's (premature  ventricular contractions)     Past Surgical History:  Procedure Laterality Date  . ABDOMINAL HYSTERECTOMY    . BREAST BIOPSY Right   . CHOLECYSTECTOMY  2014  . COLONOSCOPY  05/04/2009   prolapsing external hemorrhoids  . ENDOMETRIAL ABLATION    . HEMORRHOID SURGERY     x 3  . TUBAL LIGATION      Family History  Problem Relation Age of Onset  . Heart disease Maternal Grandmother        aunts, uncles, sister  . Diabetes Mellitus II Maternal Grandmother   . Kidney failure Sister   . Diabetes Mellitus II Sister   . Lung cancer Other        aunts and uncles  . Colon cancer Maternal Aunt        aunts and uncles  . Diabetes Mellitus II Maternal Aunt   . Colon cancer Maternal Uncle   . Diabetes Mellitus II Maternal Uncle   . Diabetes type II Mother        Current Outpatient Medications:  .  aspirin-acetaminophen-caffeine (EXCEDRIN EXTRA STRENGTH) [phone] MG per tablet, Take 1 tablet by mouth every 6 (six) hours as needed (for migraines). , Disp: , Rfl:  .  atorvastatin (LIPITOR) 20 MG tablet, TAKE 1 TABLET BY MOUTH EVERY DAY, Disp: 90 tablet, Rfl: 3 .  HYDROcodone-homatropine (HYCODAN) 5-1.5 MG/5ML syrup, Take 5 mLs by mouth every 8 (eight) hours as needed  for cough., Disp: 120 mL, Rfl: 0 .  levothyroxine (SYNTHROID, LEVOTHROID) 100 MCG tablet, TAKE 1 TABLET BY MOUTH EVERY DAY, Disp: 90 tablet, Rfl: 3 .  losartan-hydrochlorothiazide (HYZAAR) 100-25 MG tablet, TAKE 1 TABLET BY MOUTH EVERY DAY, Disp: 90 tablet, Rfl: 0 .  Polyethylene Glycol 3350 (MIRALAX PO), Take by mouth., Disp: , Rfl:  .  predniSONE (DELTASONE) 10 MG tablet, 40 mg x 3 days, 20 mg x 3 days, 10 mg x 3 days (Patient not taking: Reported on 06/29/2019), Disp: 21 tablet, Rfl: 0 .  SUMAtriptan (IMITREX) 50 MG tablet, Take 1 tablet (50 mg total) by mouth every 2 (two) hours as needed for migraine. May repeat in 2 hours if headache persists or recurs., Disp: 10 tablet, Rfl: 0  EXAM:  VITALS per patient if  applicable:  GENERAL: alert, oriented, appears well and in no acute distress  HEENT: atraumatic, conjunttiva clear, no obvious abnormalities on inspection of external nose and ears  NECK: normal movements of the head and neck  LUNGS: on inspection no signs of respiratory distress, breathing rate appears normal, no obvious gross SOB, gasping or wheezing  CV: no obvious cyanosis  MS: moves all visible extremities without noticeable abnormality  PSYCH/NEURO: pleasant and cooperative, no obvious depression or anxiety, speech and thought processing grossly intact  ASSESSMENT AND PLAN:  Discussed the following assessment and plan:  1. Suspected COVID-19 virus infection -We will refer to Respiratory Clinic tomorrow for concern of Covid and possible PNA  [name], NP      I discussed the assessment and treatment plan with the patient. The patient was provided an opportunity to ask questions and all were answered. The patient agreed with the plan and demonstrated an understanding of the instructions.   The patient was advised to call back or seek an in-person evaluation if the symptoms worsen or if the condition fails to improve as anticipated.   [name], NP

## 2019-06-30 ENCOUNTER — Ambulatory Visit (INDEPENDENT_AMBULATORY_CARE_PROVIDER_SITE_OTHER): Payer: BC Managed Care – PPO | Admitting: Internal Medicine

## 2019-06-30 VITALS — BP 126/80 | HR 71 | Temp 98.0°F | Wt 180.0 lb

## 2019-06-30 DIAGNOSIS — R059 Cough, unspecified: Secondary | ICD-10-CM

## 2019-06-30 DIAGNOSIS — R5383 Other fatigue: Secondary | ICD-10-CM

## 2019-06-30 DIAGNOSIS — R05 Cough: Secondary | ICD-10-CM | POA: Diagnosis not present

## 2019-06-30 MED ORDER — AZITHROMYCIN 250 MG PO TABS: ORAL_TABLET | ORAL | 0 refills | Status: DC

## 2019-06-30 NOTE — Patient Instructions (Addendum)
#  1 practice 3 Ws(wait 6 feet apart, wash hands regularly, wear mask) #2 zinc lozenges 5 times a day (Ex Zicam) x5 days  #3 self quarantine 10-14 days if test positive. #4 azithromycin 250 mg twice daily  x5 days (10 pills)  Reference: American Journal of Medicine, Volume 134, #1, January 2021 pages 16-22; [facility]

## 2019-06-30 NOTE — Progress Notes (Signed)
Respiratory Clinic Note   Patient: [name] Female    DOB: 28-Jan-1966   54 y.o.   MRN: [id number] Visit Date: 06/30/2019  Today's Provider: [facility]  CC: Cough  Subjective:    Covid-19 Nucleic Acid Test Results No results found for: SARSCOV2NAA, SARSCOV2  HPI Patient's initial symptoms began approximately 2 weeks ago as nonproductive cough for which prednisone and cough syrup were prescribed.  The prednisone seemed to help at first; but after it was completed the first of last week symptoms have returned.  She also has a controlled substance cough syrup as well.  The cough is nonproductive but disturbs sleep. Last week she also had body aches which she described as "a chill".  Symptoms have included fatigue, but she describes no other symptoms suggestive of Covid. She does have a past medical history of intermittent bronchitis.  She is a non-smoker.  She knows of no definite Covid exposures as she has been working from home.  Other significant medical history includes dyslipidemia, history of positive PPD, and history of pulmonary nodules.   Current Outpatient Medications:  .  aspirin-acetaminophen-caffeine (EXCEDRIN EXTRA STRENGTH) [id number] MG per tablet, Take 1 tablet by mouth every 6 (six) hours as needed (for migraines). , Disp: , Rfl:  .  atorvastatin (LIPITOR) 20 MG tablet, TAKE 1 TABLET BY MOUTH EVERY DAY, Disp: 90 tablet, Rfl: 3 .  HYDROcodone-homatropine (HYCODAN) 5-1.5 MG/5ML syrup, Take 5 mLs by mouth every 8 (eight) hours as needed for cough., Disp: 120 mL, Rfl: 0 .  levothyroxine (SYNTHROID, LEVOTHROID) 100 MCG tablet, TAKE 1 TABLET BY MOUTH EVERY DAY, Disp: 90 tablet, Rfl: 3 .  losartan-hydrochlorothiazide (HYZAAR) 100-25 MG tablet, TAKE 1 TABLET BY MOUTH EVERY DAY, Disp: 90 tablet, Rfl: 0 .  Polyethylene Glycol 3350 (MIRALAX PO), Take by mouth., Disp: , Rfl:  .  predniSONE (DELTASONE) 10 MG tablet, 40 mg x 3 days, 20 mg x 3 days, 10 mg x 3 days  (Patient not taking: Reported on 06/29/2019), Disp: 21 tablet, Rfl: 0 .  SUMAtriptan (IMITREX) 50 MG tablet, Take 1 tablet (50 mg total) by mouth every 2 (two) hours as needed for migraine. May repeat in 2 hours if headache persists or recurs., Disp: 10 tablet, Rfl: 0  No Known Allergies  Review of Systems Positive review of systems for Covid infection are documented above in HPI. Not present are: Constitutional: Documented fever HEENT: Eye redness and discharge (conjunctivitis), significant nasal congestion, sore throat, anosmia, and altered taste Pulmonary:  dyspnea, tachypnea, tachycardia, hemoptysis, and chest pain GI: Anorexia, nausea, vomiting, diarrhea Genitourinary: Oliguria or anuria Dermatologic: Rash Neurologic: Headache, dizziness, mental status changes    Objective:   BP 126/80   Pulse 71   Temp 98 F (36.7 C)   Wt 180 lb (81.6 kg)   SpO2 98%   BMI 29.05 kg/m   Physical Exam  Pertinent or positive findings: There is minimal nasal erythema.  The oropharynx is crowded.  General appearance: Adequately nourished; no acute distress, increased work of breathing is present.   Lymphatic: No lymphadenopathy about the head, neck, axilla. Eyes: No conjunctival inflammation or lid edema is present. There is no scleral icterus. Ears:  External ear exam shows no significant lesions or deformities.   Nose:  External nasal examination shows no deformity or inflammation. Oral exam:  Lips and gums are healthy appearing. There is no oropharyngeal erythema or exudate. Neck:  No thyromegaly, masses, tenderness noted.    Heart:  Normal rate  and regular rhythm. S1 and S2 normal without gallop, murmur, click, rub .  Lungs: Chest clear to auscultation without wheezes, rhonchi, rales, rubs. Extremities:  No cyanosis, clubbing, edema  No significant lesions or rash.  No results found for any visits on 06/30/19.    Assessment & Plan    Diagnoses: #1 nonproductive cough present 2  weeks #2.  Fatigue in the context of disrupted sleep from #1 Plan: #1 Covid screening #2 Zicam lozenges 5 times a day for 5 days #3 azithromycin 250 mg 1 twice daily for 5 days    [place]

## 2019-07-01 ENCOUNTER — Encounter: Payer: Self-pay | Admitting: Internal Medicine

## 2019-07-01 LAB — NOVEL CORONAVIRUS, NAA: SARS-CoV-2, NAA: NOT DETECTED

## 2019-07-05 ENCOUNTER — Telehealth: Payer: Self-pay | Admitting: Adult Health

## 2019-07-05 NOTE — Telephone Encounter (Signed)
Pt Covid test came back negative and she can not get rid of cough. She would like some cough medicine sent in if possible.    [facility]

## 2019-07-06 MED ORDER — HYDROCODONE-HOMATROPINE 5-1.5 MG/5ML PO SYRP: 5.0000 mL | ORAL_SOLUTION | Freq: Three times a day (TID) | ORAL | 0 refills | Status: DC | PRN

## 2019-07-06 MED ORDER — BENZONATATE 200 MG PO CAPS: 200.0000 mg | ORAL_CAPSULE | Freq: Two times a day (BID) | ORAL | 1 refills | Status: DC | PRN

## 2019-07-06 NOTE — Telephone Encounter (Signed)
Spoke to the pt.  She has taken Hycodan in the past.  She did state that it only lasts about 4 hours before she has to take more.  Would like a prescription for that and something else if possible to take/alternate with it so that it will last longer.  Please advise.

## 2019-07-06 NOTE — Telephone Encounter (Signed)
Pt notified that prescriptions have been sent to the pharmacy and to use a humidifier in her bedroom.  Nothing further needed.

## 2019-07-06 NOTE — Telephone Encounter (Signed)
Did hycodane work for her?

## 2019-07-06 NOTE — Telephone Encounter (Signed)
Sent in [place] [employer]  Please have her use a humidifier in her bedroom

## 2019-07-07 ENCOUNTER — Telehealth: Payer: Self-pay | Admitting: Family Medicine

## 2019-07-07 NOTE — Telephone Encounter (Signed)
Pt notified of negative Covid results.  Nothing further needed.

## 2019-07-07 NOTE — Telephone Encounter (Signed)
-----   Message from [name], NP sent at 07/07/2019  7:18 AM EST ----- Can we try and get a hold of her today?  Thanks   ----- Message ----- From: Hendricks Limes, MD Sent: 07/06/2019   4:57 PM EST To: [name], NP  I have tried several times to reach Ms. Hassell Done by phone to alert her of the negative Covid PCR testing, but her mailbox is full.  Could you staff please make sure she receives the results?  Thank you, [place]

## 2019-07-29 ENCOUNTER — Ambulatory Visit: Payer: BC Managed Care – PPO | Attending: Internal Medicine

## 2019-07-29 DIAGNOSIS — Z23 Encounter for immunization: Secondary | ICD-10-CM

## 2019-07-29 NOTE — Progress Notes (Signed)
   Covid-19 Vaccination Clinic  Name:  [name]    MRN: [id number] DOB: 10/21/1965  07/29/2019  [name] was observed post Covid-19 immunization for 15 minutes without incident. She was provided with Vaccine Information Sheet and instruction to access the V-Safe system.   [name] was instructed to call 911 with any severe reactions post vaccine: Marland Kitchen Difficulty breathing  . Swelling of face and throat  . A fast heartbeat  . A bad rash all over body  . Dizziness and weakness   Immunizations Administered    Name Date Dose VIS Date Route   Moderna COVID-19 Vaccine 07/29/2019 12:46 PM 0.5 mL 04/06/2019 Intramuscular   Manufacturer: Moderna   Lot: [id number]   [id number]

## 2019-08-04 ENCOUNTER — Telehealth: Payer: Self-pay | Admitting: Adult Health

## 2019-08-04 ENCOUNTER — Other Ambulatory Visit: Payer: Self-pay | Admitting: Adult Health

## 2019-08-04 DIAGNOSIS — R05 Cough: Secondary | ICD-10-CM

## 2019-08-04 DIAGNOSIS — R059 Cough, unspecified: Secondary | ICD-10-CM

## 2019-08-04 MED ORDER — OMEPRAZOLE 20 MG PO CPDR: 20.0000 mg | DELAYED_RELEASE_CAPSULE | Freq: Every day | ORAL | 3 refills | Status: DC

## 2019-08-04 MED ORDER — HYDROCODONE-HOMATROPINE 5-1.5 MG/5ML PO SYRP: 5.0000 mL | ORAL_SOLUTION | Freq: Three times a day (TID) | ORAL | 0 refills | Status: DC | PRN

## 2019-08-04 NOTE — Telephone Encounter (Signed)
She can come into the office

## 2019-08-04 NOTE — Telephone Encounter (Signed)
Tried reaching the pt by telephone.  Received a message that the voicemail box is full.  Will try again at a later time. 

## 2019-08-04 NOTE — Telephone Encounter (Signed)
Pt notified to follow up in 1 week to let [name] know how she is doing.  If not better then he will most likely send her to pulmonary.  Pt agreed to plan.  Nothing further needed.

## 2019-08-04 NOTE — Telephone Encounter (Signed)
Pt called in informing that she still have upper upper respiratory infection and her cough is actually getting worse. The cough is causing her chest to hurt. She would like a cough medication sent in if possible. She has tried over the counter and the medication that has been prescribed previously and nothing seems to help.

## 2019-08-04 NOTE — Telephone Encounter (Signed)
Sent in  Hycodan cough syrup and a prescription for Prilosec incase her cough is coming from silent acid reflux.  I do need to know if no improvement in the next 7 to 10 days as I will refer her to pulmonary at that time

## 2019-08-04 NOTE — Telephone Encounter (Signed)
Pt called in confused to why Prilosec was called in. I informed pt of Cory's message "Prilosec incase her cough is coming from silent acid reflux "  Pt states that she had an upper respiratory infection last month and when she coughs it hurts her chest and back so she believes it is Bronchitis and is wondering if he should call something else in?  Pt believes it is not from acid reflux and does not want to take something she does not need.   Pt can be reached at [phone]

## 2019-08-05 MED ORDER — ALBUTEROL SULFATE HFA 108 (90 BASE) MCG/ACT IN AERS: 2.0000 | INHALATION_SPRAY | RESPIRATORY_TRACT | 0 refills | Status: DC | PRN

## 2019-08-05 NOTE — Telephone Encounter (Signed)
Pt scheduled to see [facility].  Send in an inhaler to help her through the weekend.  Nothing further needed.

## 2019-08-05 NOTE — Addendum Note (Signed)
Addended by: Miles Costain T on: 08/05/2019 02:24 PM   Modules accepted: Orders

## 2019-08-09 ENCOUNTER — Other Ambulatory Visit: Payer: Self-pay

## 2019-08-10 ENCOUNTER — Ambulatory Visit (INDEPENDENT_AMBULATORY_CARE_PROVIDER_SITE_OTHER): Payer: BC Managed Care – PPO | Admitting: Adult Health

## 2019-08-10 ENCOUNTER — Ambulatory Visit (INDEPENDENT_AMBULATORY_CARE_PROVIDER_SITE_OTHER): Payer: BC Managed Care – PPO

## 2019-08-10 ENCOUNTER — Encounter: Payer: Self-pay | Admitting: Adult Health

## 2019-08-10 VITALS — BP 116/84 | HR 89 | Temp 97.9°F | Wt 180.0 lb

## 2019-08-10 DIAGNOSIS — R059 Cough, unspecified: Secondary | ICD-10-CM

## 2019-08-10 DIAGNOSIS — R05 Cough: Secondary | ICD-10-CM

## 2019-08-10 NOTE — Progress Notes (Signed)
Subjective:    Patient ID: [name], female    DOB: 1965/09/21, 54 y.o.   MRN: [id number]  HPI 54 year old female who  has a past medical history of Bell's palsy, Essential hypertension, External hemorrhoids, History of pneumonia, Hyperlipidemia, Hyperthyroidism, Iron deficiency anemia, Migraine headache, PPD positive, Pulmonary nodules, and PVC's (premature ventricular contractions).  She presents to the office today for follow up regarding cough.  Was originally seen on 05/28/2019 for a dry "barking" cough that she usually gets every winter.  In the past she has been prescribed prednisone and Hycodan cough syrup which worked well for her, unfortunately this year her symptoms did not resolve.  While she was taking the prednisone her symptoms improved but once she came off the prednisone her symptoms returned.  In February she was sent to the respiratory clinic the cough continued and she was also having body aches and chills.  She did have a Covid test that was negative and was prescribed a course of azithromycin which she completed.  Unfortunately she continues to have a cough that is mostly dry but after a long coughing spell she will have some mucus production.  The cough is keeping her up at night she has been unable to get a good night sleep since January.  She denies any recent fevers, chills, shortness of breath but has had an occasional episode of wheezing.   Review of Systems See HPI   Past Medical History:  Diagnosis Date  . Bell's palsy    with res left lag  . Essential hypertension   . External hemorrhoids   . History of pneumonia   . Hyperlipidemia   . Hyperthyroidism    hx 54 years old- oral meds  . Iron deficiency anemia   . Migraine headache   . PPD positive   . Pulmonary nodules    not seen on plain cxr, first detected 1/07 re ct 10/08 pos IPPD > 15 mm 12/09 FOB/lavage 04/20/08 neg afb smear  . PVC's (premature ventricular contractions)     Social History    Socioeconomic History  . Marital status: Divorced    Spouse name: Not on file  . Number of children: 2  . Years of education: Not on file  . Highest education level: Not on file  Occupational History  . Occupation: accounts Sport and exercise psychologist: [employer]  Tobacco Use  . Smoking status: Never Smoker  . Smokeless tobacco: Never Used  Substance and Sexual Activity  . Alcohol use: No    Alcohol/week: 0.0 standard drinks  . Drug use: No  . Sexual activity: Not on file  Other Topics Concern  . Not on file  Social History Narrative   Divorced with children   She works in Press photographer    Possible TB exposure 2008 (after nodules found)   From [place] lived there and [place]   Never owned birds      Social Determinants of Radio broadcast assistant Strain:   . Difficulty of Paying Living Expenses:   Food Insecurity:   . Worried About Charity fundraiser in the Last Year:   . Arboriculturist in the Last Year:   Transportation Needs:   . Film/video editor (Medical):   Marland Kitchen Lack of Transportation (Non-Medical):   Physical Activity:   . Days of Exercise per Week:   . Minutes of Exercise per Session:   Stress:   . Feeling of Stress :  Social Connections:   . Frequency of Communication with Friends and Family:   . Frequency of Social Gatherings with Friends and Family:   . Attends Religious Services:   . Active Member of Clubs or Organizations:   . Attends Archivist Meetings:   Marland Kitchen Marital Status:   Intimate Partner Violence:   . Fear of Current or Ex-Partner:   . Emotionally Abused:   Marland Kitchen Physically Abused:   . Sexually Abused:     Past Surgical History:  Procedure Laterality Date  . ABDOMINAL HYSTERECTOMY    . BREAST BIOPSY Right   . CHOLECYSTECTOMY  2014  . COLONOSCOPY  05/04/2009   prolapsing external hemorrhoids  . ENDOMETRIAL ABLATION    . HEMORRHOID SURGERY     x 3  . TUBAL LIGATION      Family History  Problem Relation Age of Onset  . Heart disease  Maternal Grandmother        aunts, uncles, sister  . Diabetes Mellitus II Maternal Grandmother   . Kidney failure Sister   . Diabetes Mellitus II Sister   . Lung cancer Other        aunts and uncles  . Colon cancer Maternal Aunt        aunts and uncles  . Diabetes Mellitus II Maternal Aunt   . Colon cancer Maternal Uncle   . Diabetes Mellitus II Maternal Uncle   . Diabetes type II Mother     No Known Allergies  Current Outpatient Medications on File Prior to Visit  Medication Sig Dispense Refill  . albuterol (VENTOLIN HFA) 108 (90 Base) MCG/ACT inhaler Inhale 2 puffs into the lungs every 4 (four) hours as needed for wheezing or shortness of breath. 18 g 0  . aspirin-acetaminophen-caffeine (EXCEDRIN EXTRA STRENGTH) [id number] MG per tablet Take 1 tablet by mouth every 6 (six) hours as needed (for migraines).     Marland Kitchen atorvastatin (LIPITOR) 20 MG tablet TAKE 1 TABLET BY MOUTH EVERY DAY 90 tablet 3  . HYDROcodone-homatropine (HYCODAN) 5-1.5 MG/5ML syrup Take 5 mLs by mouth every 8 (eight) hours as needed for cough. 120 mL 0  . levothyroxine (SYNTHROID, LEVOTHROID) 100 MCG tablet TAKE 1 TABLET BY MOUTH EVERY DAY 90 tablet 3  . losartan-hydrochlorothiazide (HYZAAR) 100-25 MG tablet TAKE 1 TABLET BY MOUTH EVERY DAY 90 tablet 0  . omeprazole (PRILOSEC) 20 MG capsule Take 1 capsule (20 mg total) by mouth daily. 30 capsule 3  . Polyethylene Glycol 3350 (MIRALAX PO) Take by mouth.    . SUMAtriptan (IMITREX) 50 MG tablet Take 1 tablet (50 mg total) by mouth every 2 (two) hours as needed for migraine. May repeat in 2 hours if headache persists or recurs. 10 tablet 0   No current facility-administered medications on file prior to visit.    BP 116/84   Pulse 89   Temp 97.9 F (36.6 C)   Wt 180 lb (81.6 kg)   SpO2 95%   BMI 29.05 kg/m       Objective:   Physical Exam Vitals and nursing note reviewed.  Constitutional:      Appearance: Normal appearance.  Cardiovascular:     Rate and  Rhythm: Normal rate and regular rhythm.     Pulses: Normal pulses.     Heart sounds: Normal heart sounds.  Pulmonary:     Effort: Pulmonary effort is normal. No respiratory distress.     Breath sounds: Normal breath sounds. No stridor. No wheezing, rhonchi or rales.  Comments: Dry cough present Chest:     Chest wall: No tenderness.  Abdominal:     General: Abdomen is flat. Bowel sounds are normal.     Palpations: Abdomen is soft.     Tenderness: There is no abdominal tenderness.  Neurological:     General: No focal deficit present.     Mental Status: She is alert and oriented to person, place, and time.  Psychiatric:        Mood and Affect: Mood normal.        Behavior: Behavior normal.        Thought Content: Thought content normal.        Judgment: Judgment normal.       Assessment & Plan:  1. Cough -We will get chest x-ray today.  Consider antibiotics, another course of prednisone, Singulair.  She had no abdominal pain and I do not think this is GERD related.  Can consider pulmonary referral if needed - DG Chest 2 View; Future - DG Chest 2 View  [name], NP

## 2019-08-11 ENCOUNTER — Telehealth: Payer: Self-pay | Admitting: Adult Health

## 2019-08-11 MED ORDER — MONTELUKAST SODIUM 10 MG PO TABS: 10.0000 mg | ORAL_TABLET | Freq: Every day | ORAL | 0 refills | Status: DC

## 2019-08-11 MED ORDER — PREDNISONE 50 MG PO TABS: 50.0000 mg | ORAL_TABLET | Freq: Every day | ORAL | 0 refills | Status: DC

## 2019-08-11 NOTE — Telephone Encounter (Signed)
Updated patient on her chest xray that showed no active cardiopulmonary disease.   Will treat with prednisone burst therapy and singulair

## 2019-08-26 ENCOUNTER — Ambulatory Visit: Payer: BC Managed Care – PPO | Attending: Internal Medicine

## 2019-08-26 ENCOUNTER — Other Ambulatory Visit: Payer: Self-pay | Admitting: Adult Health

## 2019-08-26 DIAGNOSIS — Z23 Encounter for immunization: Secondary | ICD-10-CM

## 2019-08-26 NOTE — Progress Notes (Signed)
   Covid-19 Vaccination Clinic  Name:  [name]    MRN: [id number] DOB: 1965-10-26  08/26/2019  [name] was observed post Covid-19 immunization for 15 minutes without incident. She was provided with Vaccine Information Sheet and instruction to access the V-Safe system.   [name] was instructed to call 911 with any severe reactions post vaccine: Marland Kitchen Difficulty breathing  . Swelling of face and throat  . A fast heartbeat  . A bad rash all over body  . Dizziness and weakness   Immunizations Administered    Name Date Dose VIS Date Route   Moderna COVID-19 Vaccine 08/26/2019 11:17 AM 0.5 mL 04/2019 Intramuscular   Manufacturer: Levan Hurst   Lot: [id number]   EdenVO:[id number]

## 2019-08-27 NOTE — Telephone Encounter (Signed)
#  [place].  PT NEEDS CPX.

## 2019-08-28 ENCOUNTER — Other Ambulatory Visit: Payer: Self-pay | Admitting: Adult Health

## 2019-08-31 NOTE — Telephone Encounter (Signed)
Sent to the pharmacy by e-scribe. 

## 2019-09-02 ENCOUNTER — Other Ambulatory Visit: Payer: Self-pay | Admitting: Adult Health

## 2019-09-03 NOTE — Telephone Encounter (Signed)
30 days, needs to schedule CPE

## 2019-09-03 NOTE — Telephone Encounter (Signed)
SENT TO THE PHARMACY BY E-SCRIBE FOR 30 DAYS.

## 2019-09-17 ENCOUNTER — Other Ambulatory Visit: Payer: Self-pay | Admitting: Adult Health

## 2019-09-17 NOTE — Telephone Encounter (Signed)
Denied.   Pt is due for yearly physical.

## 2019-09-19 ENCOUNTER — Other Ambulatory Visit: Payer: Self-pay | Admitting: Adult Health

## 2019-09-21 ENCOUNTER — Other Ambulatory Visit: Payer: Self-pay | Admitting: Adult Health

## 2019-09-21 DIAGNOSIS — Z1231 Encounter for screening mammogram for malignant neoplasm of breast: Secondary | ICD-10-CM

## 2019-09-22 NOTE — Telephone Encounter (Signed)
Ok to fill until then

## 2019-09-22 NOTE — Telephone Encounter (Signed)
Sent to the pharmacy by e-scribe. 

## 2019-09-28 ENCOUNTER — Other Ambulatory Visit: Payer: Self-pay | Admitting: Adult Health

## 2019-10-14 ENCOUNTER — Other Ambulatory Visit: Payer: Self-pay

## 2019-10-14 ENCOUNTER — Ambulatory Visit
Admission: RE | Admit: 2019-10-14 | Discharge: 2019-10-14 | Disposition: A | Discharge status: Home / Self Care | Payer: BC Managed Care – PPO | Source: Ambulatory Visit | Attending: Adult Health | Admitting: Adult Health

## 2019-10-14 DIAGNOSIS — Z1231 Encounter for screening mammogram for malignant neoplasm of breast: Secondary | ICD-10-CM

## 2019-10-18 ENCOUNTER — Other Ambulatory Visit: Payer: Self-pay | Admitting: Adult Health

## 2019-10-18 DIAGNOSIS — R928 Other abnormal and inconclusive findings on diagnostic imaging of breast: Secondary | ICD-10-CM

## 2019-10-22 ENCOUNTER — Ambulatory Visit
Admission: RE | Admit: 2019-10-22 | Discharge: 2019-10-22 | Disposition: A | Discharge status: Home / Self Care | Payer: BC Managed Care – PPO | Source: Ambulatory Visit | Attending: Adult Health | Admitting: Adult Health

## 2019-10-22 ENCOUNTER — Other Ambulatory Visit: Payer: Self-pay

## 2019-10-22 ENCOUNTER — Other Ambulatory Visit: Payer: Self-pay | Admitting: Adult Health

## 2019-10-22 DIAGNOSIS — N6324 Unspecified lump in the left breast, lower inner quadrant: Secondary | ICD-10-CM | POA: Diagnosis not present

## 2019-10-22 DIAGNOSIS — R928 Other abnormal and inconclusive findings on diagnostic imaging of breast: Secondary | ICD-10-CM

## 2019-10-22 DIAGNOSIS — R922 Inconclusive mammogram: Secondary | ICD-10-CM | POA: Diagnosis not present

## 2019-10-29 ENCOUNTER — Other Ambulatory Visit: Payer: Self-pay

## 2019-10-29 ENCOUNTER — Ambulatory Visit
Admission: RE | Admit: 2019-10-29 | Discharge: 2019-10-29 | Disposition: A | Discharge status: Home / Self Care | Payer: BC Managed Care – PPO | Source: Ambulatory Visit | Attending: Adult Health | Admitting: Adult Health

## 2019-10-29 DIAGNOSIS — N6012 Diffuse cystic mastopathy of left breast: Secondary | ICD-10-CM | POA: Diagnosis not present

## 2019-10-29 DIAGNOSIS — N6324 Unspecified lump in the left breast, lower inner quadrant: Secondary | ICD-10-CM | POA: Diagnosis not present

## 2019-10-29 DIAGNOSIS — R928 Other abnormal and inconclusive findings on diagnostic imaging of breast: Secondary | ICD-10-CM

## 2019-10-29 DIAGNOSIS — R590 Localized enlarged lymph nodes: Secondary | ICD-10-CM | POA: Diagnosis not present

## 2019-10-29 HISTORY — PX: BREAST BIOPSY: SHX20

## 2019-11-03 ENCOUNTER — Telehealth: Payer: Self-pay | Admitting: Adult Health

## 2019-11-03 ENCOUNTER — Other Ambulatory Visit: Payer: Self-pay

## 2019-11-03 ENCOUNTER — Encounter: Payer: Self-pay | Admitting: Adult Health

## 2019-11-03 ENCOUNTER — Ambulatory Visit (INDEPENDENT_AMBULATORY_CARE_PROVIDER_SITE_OTHER): Payer: BC Managed Care – PPO | Admitting: Adult Health

## 2019-11-03 VITALS — BP 124/84 | Temp 98.0°F | Wt 185.0 lb

## 2019-11-03 DIAGNOSIS — R059 Cough, unspecified: Secondary | ICD-10-CM

## 2019-11-03 DIAGNOSIS — R5383 Other fatigue: Secondary | ICD-10-CM

## 2019-11-03 DIAGNOSIS — R05 Cough: Secondary | ICD-10-CM | POA: Diagnosis not present

## 2019-11-03 DIAGNOSIS — R928 Other abnormal and inconclusive findings on diagnostic imaging of breast: Secondary | ICD-10-CM

## 2019-11-03 LAB — BASIC METABOLIC PANEL
BUN: 14 mg/dL (ref 6–23)
CO2: 27 mEq/L (ref 19–32)
Calcium: 9.3 mg/dL (ref 8.4–10.5)
Chloride: 104 mEq/L (ref 96–112)
Creatinine, Ser: 0.97 mg/dL (ref 0.40–1.20)
GFR: 72.96 mL/min (ref >60.00)
Glucose, Bld: 105 mg/dL — ABNORMAL HIGH (ref 70–99)
Potassium: 2.9 mEq/L — ABNORMAL LOW (ref 3.5–5.1)
Sodium: 140 mEq/L (ref 135–145)

## 2019-11-03 LAB — IBC + FERRITIN
Ferritin: 129.4 ng/mL (ref 10.0–291.0)
Iron: 59 ug/dL (ref 42–145)
Saturation Ratios: 22.2 % (ref 20.0–50.0)
Transferrin: 190 mg/dL — ABNORMAL LOW (ref 212.0–360.0)

## 2019-11-03 LAB — VITAMIN D 25 HYDROXY (VIT D DEFICIENCY, FRACTURES): VITD: 10.9 ng/mL — ABNORMAL LOW (ref 30.00–100.00)

## 2019-11-03 LAB — TSH: TSH: 8.63 u[IU]/mL — ABNORMAL HIGH (ref 0.35–4.50)

## 2019-11-03 MED ORDER — POTASSIUM CHLORIDE ER 10 MEQ PO TBCR: 10.0000 meq | EXTENDED_RELEASE_TABLET | Freq: Every day | ORAL | 0 refills | Status: DC

## 2019-11-03 MED ORDER — VITAMIN D (ERGOCALCIFEROL) 1.25 MG (50000 UNIT) PO CAPS
50000.0000 [IU] | ORAL_CAPSULE | ORAL | 0 refills | Status: DC
Start: 2019-11-03 — End: 2020-01-26

## 2019-11-03 NOTE — Progress Notes (Signed)
Subjective:    Patient ID: [name], female    DOB: May 18, 1965, 54 y.o.   MRN: [id number]  HPI 54 year old female who  has a past medical history of Bell's palsy, Essential hypertension, External hemorrhoids, History of pneumonia, Hyperlipidemia, Hyperthyroidism, Iron deficiency anemia, Migraine headache, PPD positive, Pulmonary nodules, and PVC's (premature ventricular contractions).  She presents to the office today with multiple issues.  She recently had abnormal mammogram which led to biopsy of the left breast as well as axillary node.  Thankfully both of these biopsies came back negative.  She continues to have some mild axillary pain but overall all is improving.  Over the last 6 months she has had a chronic cough, we have tried antibiotic as well as steroid therapy but her symptoms have not improved.  She reports that for a short period of time her cough "calm down" but has since come back to being constant.  She denies fevers, chills, productive cough, wheezing, or shortness of breath.  X-rays in the past have been negative for acute pulmonary disease but unfortunately she continues to have this issue.  She denies GERD symptoms.  He does have history of stable nodules in the right lung with her last CT scan being in 2018.  Additionally, she has had chronic fatigue for an unknown amount of time and had related this to lack of sleep from cough in the past.  More recently since she is received her Covid vaccination she has had hot flashes" that last all day.  The feeling of heat intolerance does not go away throughout the day despite lack of fever or chills.  She is hypothyroid but is well controlled on Synthroid.   Review of Systems See HPI   Past Medical History:  Diagnosis Date  . Bell's palsy    with res left lag  . Essential hypertension   . External hemorrhoids   . History of pneumonia   . Hyperlipidemia   . Hyperthyroidism    hx 54 years old- oral meds  . Iron  deficiency anemia   . Migraine headache   . PPD positive   . Pulmonary nodules    not seen on plain cxr, first detected 1/07 re ct 10/08 pos IPPD > 15 mm 12/09 FOB/lavage 04/20/08 neg afb smear  . PVC's (premature ventricular contractions)     Social History   Socioeconomic History  . Marital status: Divorced    Spouse name: Not on file  . Number of children: 2  . Years of education: Not on file  . Highest education level: Not on file  Occupational History  . Occupation: accounts Sport and exercise psychologist: [employer]  Tobacco Use  . Smoking status: Never Smoker  . Smokeless tobacco: Never Used  Substance and Sexual Activity  . Alcohol use: No    Alcohol/week: 0.0 standard drinks  . Drug use: No  . Sexual activity: Not on file  Other Topics Concern  . Not on file  Social History Narrative   Divorced with children   She works in Press photographer    Possible TB exposure 2008 (after nodules found)   From [place] lived there and [place]   Never owned birds      Social Determinants of Radio broadcast assistant Strain:   . Difficulty of Paying Living Expenses:   Food Insecurity:   . Worried About Charity fundraiser in the Last Year:   . Arboriculturist in  the Last Year:   Transportation Needs:   . Film/video editor (Medical):   Marland Kitchen Lack of Transportation (Non-Medical):   Physical Activity:   . Days of Exercise per Week:   . Minutes of Exercise per Session:   Stress:   . Feeling of Stress :   Social Connections:   . Frequency of Communication with Friends and Family:   . Frequency of Social Gatherings with Friends and Family:   . Attends Religious Services:   . Active Member of Clubs or Organizations:   . Attends Archivist Meetings:   Marland Kitchen Marital Status:   Intimate Partner Violence:   . Fear of Current or Ex-Partner:   . Emotionally Abused:   Marland Kitchen Physically Abused:   . Sexually Abused:     Past Surgical History:  Procedure Laterality Date  . ABDOMINAL HYSTERECTOMY      . BREAST BIOPSY Right   . CHOLECYSTECTOMY  2014  . COLONOSCOPY  05/04/2009   prolapsing external hemorrhoids  . ENDOMETRIAL ABLATION    . HEMORRHOID SURGERY     x 3  . TUBAL LIGATION      Family History  Problem Relation Age of Onset  . Heart disease Maternal Grandmother        aunts, uncles, sister  . Diabetes Mellitus II Maternal Grandmother   . Kidney failure Sister   . Diabetes Mellitus II Sister   . Lung cancer Other        aunts and uncles  . Colon cancer Maternal Aunt        aunts and uncles  . Diabetes Mellitus II Maternal Aunt   . Colon cancer Maternal Uncle   . Diabetes Mellitus II Maternal Uncle   . Diabetes type II Mother     No Known Allergies  Current Outpatient Medications on File Prior to Visit  Medication Sig Dispense Refill  . albuterol (VENTOLIN HFA) 108 (90 Base) MCG/ACT inhaler INHALE 2 PUFFS INTO THE LUNGS EVERY 4 (FOUR) HOURS AS NEEDED FOR WHEEZING OR SHORTNESS OF BREATH. 18 g 0  . aspirin-acetaminophen-caffeine (EXCEDRIN EXTRA STRENGTH) 250-250-65 MG per tablet Take 1 tablet by mouth every 6 (six) hours as needed (for migraines).     Marland Kitchen atorvastatin (LIPITOR) 20 MG tablet TAKE 1 TABLET BY MOUTH EVERY DAY 90 tablet 3  . levothyroxine (SYNTHROID, LEVOTHROID) 100 MCG tablet TAKE 1 TABLET BY MOUTH EVERY DAY 90 tablet 3  . losartan-hydrochlorothiazide (HYZAAR) 100-25 MG tablet TAKE 1 TABLET BY MOUTH DAILY. **DUE FOR YEARLY PHYSICAL** 90 tablet 0  . montelukast (SINGULAIR) 10 MG tablet Take 1 tablet by mouth at bedtime. 90 tablet 1  . omeprazole (PRILOSEC) 20 MG capsule Take 1 capsule (20 mg total) by mouth daily. 30 capsule 3  . Polyethylene Glycol 3350 (MIRALAX PO) Take by mouth.    . SUMAtriptan (IMITREX) 50 MG tablet Take 1 tablet (50 mg total) by mouth every 2 (two) hours as needed for migraine. May repeat in 2 hours if headache persists or recurs. 10 tablet 0   No current facility-administered medications on file prior to visit.    BP 124/84    Temp 98 F (36.7 C)   Wt 185 lb (83.9 kg)   BMI 29.86 kg/m       Objective:   Physical Exam Vitals and nursing note reviewed.  Constitutional:      Appearance: Normal appearance.  Cardiovascular:     Rate and Rhythm: Normal rate and regular rhythm.     Pulses:  Normal pulses.     Heart sounds: Normal heart sounds.  Pulmonary:     Effort: Pulmonary effort is normal.     Breath sounds: Normal breath sounds.  Musculoskeletal:        General: Tenderness (mild tenderness in left axillary ) present. Normal range of motion.  Skin:    General: Skin is warm and dry.  Neurological:     General: No focal deficit present.     Mental Status: She is alert and oriented to person, place, and time.  Psychiatric:        Mood and Affect: Mood normal.        Behavior: Behavior normal.        Thought Content: Thought content normal.        Judgment: Judgment normal.       Assessment & Plan:  1. Fatigue, unspecified type - Unknown cause but will check labs to start workup. Cannot r/o other autoimmune disease such as lupus  - ANA - TSH - Vitamin D, 25-hydroxy - Vitamin D, 25-hydroxy - IBC + Ferritin - Basic Metabolic Panel  2. Cough  - CT Chest W Contrast; Future - Ambulatory referral to Pulmonology  3. Abnormal mammogram of left breast - Reviewed pathology results with the patient. All questions answered to the best of my ability    [name], NP

## 2019-11-03 NOTE — Patient Instructions (Signed)
It was great seeing you today   I am going to refer you to Pulmonary and get a CT scan of your chest - you will be called about these appointments.   I will follow up with you regarding your blood work

## 2019-11-03 NOTE — Telephone Encounter (Signed)
Spoke to patient and informed her of her labs.  Her TSH was 8.4.  She reported that she is not taking her Synthroid every day and maybe takes it 4-5 times a week.  She will go back to taking it every day.  Her vitamin D level was also low at 10, I am going to supplement her with 50,000 units of vitamin D and recheck in 3 months  Potassium level was also low due to taking hydrochlorothiazide she is okay with supplementing with potassium as this blood pressure medication works well for her.  Waiting on ANA and will call her back once that results

## 2019-11-05 ENCOUNTER — Other Ambulatory Visit: Payer: Self-pay | Admitting: Adult Health

## 2019-11-06 LAB — ANTI-NUCLEAR AB-TITER (ANA TITER): ANA Titer 1: 1:40 {titer} — ABNORMAL HIGH

## 2019-11-06 LAB — ANA: Anti Nuclear Antibody (ANA): POSITIVE — AB

## 2019-11-09 ENCOUNTER — Ambulatory Visit (INDEPENDENT_AMBULATORY_CARE_PROVIDER_SITE_OTHER): Payer: BC Managed Care – PPO | Admitting: Pulmonary Disease

## 2019-11-09 ENCOUNTER — Other Ambulatory Visit: Payer: Self-pay | Admitting: Family Medicine

## 2019-11-09 ENCOUNTER — Encounter: Payer: Self-pay | Admitting: Pulmonary Disease

## 2019-11-09 ENCOUNTER — Other Ambulatory Visit: Payer: Self-pay

## 2019-11-09 ENCOUNTER — Telehealth: Payer: Self-pay | Admitting: Adult Health

## 2019-11-09 DIAGNOSIS — R053 Chronic cough: Secondary | ICD-10-CM

## 2019-11-09 DIAGNOSIS — R05 Cough: Secondary | ICD-10-CM | POA: Diagnosis not present

## 2019-11-09 DIAGNOSIS — R5383 Other fatigue: Secondary | ICD-10-CM

## 2019-11-09 DIAGNOSIS — G4733 Obstructive sleep apnea (adult) (pediatric): Secondary | ICD-10-CM

## 2019-11-09 DIAGNOSIS — R059 Cough, unspecified: Secondary | ICD-10-CM

## 2019-11-09 DIAGNOSIS — R918 Other nonspecific abnormal finding of lung field: Secondary | ICD-10-CM

## 2019-11-09 DIAGNOSIS — R768 Other specified abnormal immunological findings in serum: Secondary | ICD-10-CM

## 2019-11-09 NOTE — Assessment & Plan Note (Signed)
She will contact her dentist at her next visit for oral appliance

## 2019-11-09 NOTE — Telephone Encounter (Signed)
SENT TO THE PHARMACY FOR 90 DAYS.  PT HAS UPCOMING CPX IN SEPT.

## 2019-11-09 NOTE — Progress Notes (Signed)
   Subjective:    Patient ID: [name], female    DOB: Nov 07, 1965, 54 y.o.   MRN: [id number]  HPI  54 year old for follow-up of mild OSA , predominant supine position She declined CPAP therapy or oral appliance, last seen 2019 She now presents for evaluation of new problem -cough.  She reports acute onset in January 2021, with URI at onset, initially treated with antibiotics and then a prednisone course and cough syrup.  She has also been treated with Prilosec and benzonatate without much relief. She reports a deep cough initially productive of minimal amount of clear phlegm and now mostly dry that bothers her both day and night and occasionally does wake her up from sleep.  She underwent mammogram 10/2019 and subsequent biopsy of left breast nodule and left axillary lymph node which was noted to be benign  Blood work from PCP reviewed, ANA 1:40 , she denies photosensitivity, skin rash or arthralgias/arthritis  PMH - OSA, latent TB in her 52s, treated   Significant tests/ events reviewed HST 03/2017 mild OSA, AHI 12/hour  CT chest w contrast 01/2017 right lower lobe nodule has undergone growth from 6 to just under 9 mm although central calcification is now seen consistent with granulomatous change   Past Medical History:  Diagnosis Date  . Bell's palsy    with res left lag  . Essential hypertension   . External hemorrhoids   . History of pneumonia   . Hyperlipidemia   . Hyperthyroidism    hx 54 years old- oral meds  . Iron deficiency anemia   . Migraine headache   . PPD positive   . Pulmonary nodules    not seen on plain cxr, first detected 1/07 re ct 10/08 pos IPPD > 15 mm 12/09 FOB/lavage 04/20/08 neg afb smear  . PVC's (premature ventricular contractions)     Review of Systems  Patient denies significant dyspnea,cough, hemoptysis,  chest pain, palpitations, pedal edema, orthopnea, paroxysmal nocturnal dyspnea, lightheadedness, nausea, vomiting, abdominal or  leg  pains      Objective:   Physical Exam  Gen. Pleasant, well-nourished, in no distress ENT - no thrush, no pallor/icterus,no post nasal drip Neck: No JVD, no thyromegaly, no carotid bruits Lungs: no use of accessory muscles, no dullness to percussion, clear without rales or rhonchi  Cardiovascular: Rhythm regular, heart sounds  normal, no murmurs or gallops, no peripheral edema Musculoskeletal: No deformities, no cyanosis or clubbing        Assessment & Plan:

## 2019-11-09 NOTE — Assessment & Plan Note (Signed)
Right lower lobe nodule was calcified and likely benign, but other subcentimeter nodules were present on prior CT in 2018. We will schedule follow-up CT for this and also for unexplained Cough

## 2019-11-09 NOTE — Telephone Encounter (Signed)
I am not, she was not taking it every day

## 2019-11-09 NOTE — Telephone Encounter (Signed)
Pt called in for result b/c she had rec'd it on her mychart and did not understand.  Pt is aware that the office was closed on Monday 11/08/2019 for the holiday and someone will give her call when the provider has resulted it.

## 2019-11-09 NOTE — Assessment & Plan Note (Addendum)
CT chest without contrast to follow-up on nodule and unexplained cough.  Chronic cough may be related to postnasal drip or silent reflux.  Trial of PPI has not helped so we will attempt trial of oxygenation antihistaminic and decongestant combination  Trial of chlorpheniramine 4 -8  mg OTC at bedtime for 3 weeks, this may make you sleepy.  Store brand Sudafed -OTC phenylephrine 10 mg in the daytime for 2 weeks  Call me to report if cough is improved after 3 weeks

## 2019-11-09 NOTE — Patient Instructions (Signed)
  CT chest without contrast to follow-up on nodule and unexplained cough.  Chronic cough may be related to postnasal drip or silent reflux.  Trial of chlorpheniramine 4 -8  mg OTC at bedtime for 3 weeks, this may make you sleepy.  Store brand Sudafed -OTC phenylephrine 10 mg in the daytime for 2 weeks  Call me to report if cough is improved after 3 weeks

## 2019-11-09 NOTE — Telephone Encounter (Signed)
Noted. See result note.  Nothing further needed.

## 2019-11-12 ENCOUNTER — Telehealth: Payer: Self-pay | Admitting: Pulmonary Disease

## 2019-11-12 NOTE — Telephone Encounter (Signed)
I had already attempted to call the patient to schedule this CT but her mail box was full. [name] has the CT scheduled on 11/23/2019 @ 10:00am at [facility] and she is aware of the appt and location

## 2019-11-22 ENCOUNTER — Telehealth: Payer: Self-pay | Admitting: Adult Health

## 2019-11-22 NOTE — Telephone Encounter (Signed)
Pt is calling to check the status of her referral to the Rheumatology.  Information about the referral was given from [place] to give to the pt it is [facility] 336 [phone].

## 2019-11-23 ENCOUNTER — Ambulatory Visit (INDEPENDENT_AMBULATORY_CARE_PROVIDER_SITE_OTHER)
Admission: RE | Admit: 2019-11-23 | Discharge: 2019-11-23 | Disposition: A | Discharge status: Home / Self Care | Payer: BC Managed Care – PPO | Source: Ambulatory Visit | Attending: Pulmonary Disease | Admitting: Pulmonary Disease

## 2019-11-23 ENCOUNTER — Other Ambulatory Visit: Payer: Self-pay

## 2019-11-23 DIAGNOSIS — R918 Other nonspecific abnormal finding of lung field: Secondary | ICD-10-CM | POA: Diagnosis not present

## 2019-11-23 DIAGNOSIS — J438 Other emphysema: Secondary | ICD-10-CM | POA: Diagnosis not present

## 2019-11-23 DIAGNOSIS — J8410 Pulmonary fibrosis, unspecified: Secondary | ICD-10-CM | POA: Diagnosis not present

## 2019-11-23 DIAGNOSIS — J9811 Atelectasis: Secondary | ICD-10-CM | POA: Diagnosis not present

## 2019-11-23 DIAGNOSIS — R05 Cough: Secondary | ICD-10-CM | POA: Diagnosis not present

## 2019-11-23 DIAGNOSIS — R059 Cough, unspecified: Secondary | ICD-10-CM

## 2019-12-16 ENCOUNTER — Telehealth (INDEPENDENT_AMBULATORY_CARE_PROVIDER_SITE_OTHER): Payer: BC Managed Care – PPO | Admitting: Family Medicine

## 2019-12-16 ENCOUNTER — Encounter: Payer: Self-pay | Admitting: Family Medicine

## 2019-12-16 DIAGNOSIS — R05 Cough: Secondary | ICD-10-CM

## 2019-12-16 DIAGNOSIS — R059 Cough, unspecified: Secondary | ICD-10-CM

## 2019-12-16 MED ORDER — BENZONATATE 100 MG PO CAPS: 100.0000 mg | ORAL_CAPSULE | Freq: Three times a day (TID) | ORAL | 0 refills | Status: DC | PRN

## 2019-12-16 NOTE — Progress Notes (Signed)
Virtual Visit via Video Note  I connected with [name]  on 12/16/19 at  3:20 PM EDT by a video enabled telemedicine application and verified that I am speaking with the correct person using two identifiers.  Location patient: home, [place] Location provider:work or home office Persons participating in the virtual visit: patient, provider  I discussed the limitations of evaluation and management by telemedicine and the availability of in person appointments. The patient expressed understanding and agreed to proceed.   HPI:  Acute visit for a cough: -started 2 days ago -symptoms include cough, drainage -niece sick with resp symptoms last week and the weekend - has RSV -denies fevers, NVD, SOB, body aches, malaise -fully vaccinated for COVID19 with moderna -denies any chronic lung disease or immunocomprise   ROS: See pertinent positives and negatives per HPI.  Past Medical History:  Diagnosis Date  . Bell's palsy    with res left lag  . Essential hypertension   . External hemorrhoids   . History of pneumonia   . Hyperlipidemia   . Hyperthyroidism    hx 54 years old- oral meds  . Iron deficiency anemia   . Migraine headache   . PPD positive   . Pulmonary nodules    not seen on plain cxr, first detected 1/07 re ct 10/08 pos IPPD > 15 mm 12/09 FOB/lavage 04/20/08 neg afb smear  . PVC's (premature ventricular contractions)     Past Surgical History:  Procedure Laterality Date  . ABDOMINAL HYSTERECTOMY    . BREAST BIOPSY Right   . CHOLECYSTECTOMY  2014  . COLONOSCOPY  05/04/2009   prolapsing external hemorrhoids  . ENDOMETRIAL ABLATION    . HEMORRHOID SURGERY     x 3  . TUBAL LIGATION      Family History  Problem Relation Age of Onset  . Heart disease Maternal Grandmother        aunts, uncles, sister  . Diabetes Mellitus II Maternal Grandmother   . Kidney failure Sister   . Diabetes Mellitus II Sister   . Lung cancer Other        aunts and uncles  . Colon cancer Maternal  Aunt        aunts and uncles  . Diabetes Mellitus II Maternal Aunt   . Colon cancer Maternal Uncle   . Diabetes Mellitus II Maternal Uncle   . Diabetes type II Mother     SOCIAL HX: see hpi   Current Outpatient Medications:  .  albuterol (VENTOLIN HFA) 108 (90 Base) MCG/ACT inhaler, INHALE 2 PUFFS INTO THE LUNGS EVERY 4 (FOUR) HOURS AS NEEDED FOR WHEEZING OR SHORTNESS OF BREATH., Disp: 18 g, Rfl: 0 .  aspirin-acetaminophen-caffeine (EXCEDRIN EXTRA STRENGTH) 952-841-32 MG per tablet, Take 1 tablet by mouth every 6 (six) hours as needed (for migraines). , Disp: , Rfl:  .  atorvastatin (LIPITOR) 20 MG tablet, TAKE 1 TABLET BY MOUTH EVERY DAY, Disp: 90 tablet, Rfl: 3 .  levothyroxine (SYNTHROID) 100 MCG tablet, TAKE 1 TABLET BY MOUTH EVERY DAY, Disp: 90 tablet, Rfl: 0 .  losartan-hydrochlorothiazide (HYZAAR) 100-25 MG tablet, TAKE 1 TABLET BY MOUTH DAILY. **DUE FOR YEARLY PHYSICAL**, Disp: 90 tablet, Rfl: 0 .  montelukast (SINGULAIR) 10 MG tablet, Take 1 tablet by mouth at bedtime., Disp: 90 tablet, Rfl: 1 .  omeprazole (PRILOSEC) 20 MG capsule, Take 1 capsule (20 mg total) by mouth daily., Disp: 30 capsule, Rfl: 3 .  Polyethylene Glycol 3350 (MIRALAX PO), Take by mouth., Disp: , Rfl:  .  potassium chloride (KLOR-CON) 10 MEQ tablet, Take 1 tablet (10 mEq total) by mouth daily., Disp: 90 tablet, Rfl: 0 .  SUMAtriptan (IMITREX) 50 MG tablet, Take 1 tablet (50 mg total) by mouth every 2 (two) hours as needed for migraine. May repeat in 2 hours if headache persists or recurs., Disp: 10 tablet, Rfl: 0 .  Vitamin D, Ergocalciferol, (DRISDOL) 1.25 MG (50000 UNIT) CAPS capsule, Take 1 capsule (50,000 Units total) by mouth every 7 (seven) days., Disp: 12 capsule, Rfl: 0 .  benzonatate (TESSALON PERLES) 100 MG capsule, Take 1 capsule (100 mg total) by mouth 3 (three) times daily as needed., Disp: 20 capsule, Rfl: 0  EXAM:  VITALS per patient if applicable:  GENERAL: alert, oriented, appears well and  in no acute distress  HEENT: atraumatic, conjunttiva clear, no obvious abnormalities on inspection of external nose and ears  NECK: normal movements of the head and neck  LUNGS: on inspection no signs of respiratory distress, breathing rate appears normal, no obvious gross SOB, gasping or wheezing  CV: no obvious cyanosis  MS: moves all visible extremities without noticeable abnormality  PSYCH/NEURO: pleasant and cooperative, no obvious depression or anxiety, speech and thought processing grossly intact  ASSESSMENT AND PLAN:  Discussed the following assessment and plan:  Cough  -we discussed possible serious and likely etiologies, options for evaluation and workup, limitations of telemedicine visit vs in person visit, treatment, treatment risks and precautions. Pt prefers to treat via telemedicine empirically rather then risking or undertaking an in person visit at this moment. Query VURI, RSV vs other. COVID19 less likely given vaccinated, but with recent increase and transmission of Delta variant advised and discussed testing options. Advised staying home while sick and per CDC if covid19. Advised of treatment options and COVID19 outpatient treatment contact info provided given headed into the weekend in case of positive test. In interim rx for tessalon provided and symptomatic OTC options discussed. Advised to seek prompt follow up or in person care if worsening, new symptoms arise, or if is not improving with treatment.   I discussed the assessment and treatment plan with the patient. The patient was provided an opportunity to ask questions and all were answered. The patient agreed with the plan and demonstrated an understanding of the instructions.   The patient was advised to call back or seek an in-person evaluation if the symptoms worsen or if the condition fails to improve as anticipated.   Lucretia Kern, DO

## 2019-12-17 ENCOUNTER — Telehealth: Payer: Self-pay | Admitting: Adult Health

## 2019-12-17 MED ORDER — HYDROCODONE-HOMATROPINE 5-1.5 MG/5ML PO SYRP: 5.0000 mL | ORAL_SOLUTION | Freq: Four times a day (QID) | ORAL | 0 refills | Status: AC | PRN

## 2019-12-17 NOTE — Telephone Encounter (Signed)
Patient informed of the message below.

## 2019-12-17 NOTE — Telephone Encounter (Signed)
Looks like she has taken Hycodan cough syrup in the past and I sent in 1 refill to her pharmacy

## 2019-12-17 NOTE — Telephone Encounter (Addendum)
Pt saw Dr. Maudie Mercury virtually yesterday and she thought she was going to call in a cough syrup and not the pearls. She would like the syrup instead and asked for it to be called in today because the coughing is hurting her chest and head. Informed pt I would send it to both [name] and Maudie Mercury but both are off today so whoever covering Cory's basket would review it.   Pt would like a call back at [phone]   Pharmacy: [id number] - [place], [place] Phone:  [phone]  Fax:  [phone]

## 2019-12-20 NOTE — Telephone Encounter (Signed)
Hydromet is basically the same as Hycodan.  She can take plain Mucinex 400-500 mg 2 every 12 hours.

## 2019-12-20 NOTE — Telephone Encounter (Signed)
Spoke with the pt and informed her of the message below.   

## 2019-12-20 NOTE — Telephone Encounter (Signed)
Pt stated when she went to the pharmacy she picked up the syrup and they gave her hydromet. Pt stated that is not what she is use to taking and it does not taste the same. She would like what she usually takes. She is also wondering if she is able to take a mucinex on top of what she is already taking and which one?   Pt can be reached ar [phone]

## 2019-12-21 ENCOUNTER — Ambulatory Visit: Payer: BC Managed Care – PPO | Admitting: Adult Health

## 2019-12-22 ENCOUNTER — Ambulatory Visit: Payer: BC Managed Care – PPO | Admitting: Adult Health

## 2020-01-11 ENCOUNTER — Encounter: Payer: BC Managed Care – PPO | Admitting: Adult Health

## 2020-01-11 NOTE — Progress Notes (Deleted)
Subjective:    Patient ID: [name], female    DOB: 12/29/1965, 54 y.o.   MRN: [id number]  HPI Patient presents for yearly preventative medicine examination. She is a pleasant 54 year old female who  has a past medical history of Bell's palsy, Essential hypertension, External hemorrhoids, History of pneumonia, Hyperlipidemia, Hyperthyroidism, Iron deficiency anemia, Migraine headache, PPD positive, Pulmonary nodules, and PVC's (premature ventricular contractions).  Essential Hypertension -she takes Cozaar 100 mg and hydrochlorothiazide 25 mg daily.  She denies dizziness, lightheadedness, chest pain, or shortness of breath.  Hypothyroidism-is currently prescribed Synthroid 100 mcg daily  Migraine headaches-infrequent.  Takes Imitrex as needed  Hyperlipidemia - Currently prescribed Lipitor 20 mg daily.  She denies myalgia or fatigue Lab Results  Component Value Date   CHOL 248 (H) 04/08/2018   HDL 27.70 (L) 04/08/2018   LDLCALC 179 (H) 01/20/2017   LDLDIRECT 180.0 04/08/2018   TRIG 243.0 (H) 04/08/2018   CHOLHDL 9 04/08/2018   Vitamin D Deficiency -taking vitamin D 50,000 units weekly Last vitamin D Lab Results  Component Value Date   VD25OH 10.90 (L) 11/03/2019     All immunizations and health maintenance protocols were reviewed with the patient and needed orders were placed.  Appropriate screening laboratory values were ordered for the patient including screening of hyperlipidemia, renal function and hepatic function.  Medication reconciliation,  past medical history, social history, problem list and allergies were reviewed in detail with the patient  Goals were established with regard to weight loss, exercise, and  diet in compliance with medications  She is up-to-date on routine mammograms, she underwent a mammogram in 10/2019 and subsequent biopsy of left breast nodule and left axillary lymph node that was benign.  She is due for routine screening colonoscopy    Review of Systems  Constitutional: Negative.   HENT: Negative.   Eyes: Negative.   Respiratory: Negative.   Cardiovascular: Negative.   Gastrointestinal: Negative.   Endocrine: Negative.   Genitourinary: Negative.   Musculoskeletal: Negative.   Skin: Negative.   Allergic/Immunologic: Negative.   Neurological: Negative.   Hematological: Negative.   Psychiatric/Behavioral: Negative.    Past Medical History:  Diagnosis Date  . Bell's palsy    with res left lag  . Essential hypertension   . External hemorrhoids   . History of pneumonia   . Hyperlipidemia   . Hyperthyroidism    hx 54 years old- oral meds  . Iron deficiency anemia   . Migraine headache   . PPD positive   . Pulmonary nodules    not seen on plain cxr, first detected 1/07 re ct 10/08 pos IPPD > 15 mm 12/09 FOB/lavage 04/20/08 neg afb smear  . PVC's (premature ventricular contractions)     Social History   Socioeconomic History  . Marital status: Divorced    Spouse name: Not on file  . Number of children: 2  . Years of education: Not on file  . Highest education level: Not on file  Occupational History  . Occupation: accounts Sport and exercise psychologist: [employer]  Tobacco Use  . Smoking status: Never Smoker  . Smokeless tobacco: Never Used  Substance and Sexual Activity  . Alcohol use: No    Alcohol/week: 0.0 standard drinks  . Drug use: No  . Sexual activity: Not on file  Other Topics Concern  . Not on file  Social History Narrative   Divorced with children   She works in Press photographer    Possible  TB exposure 2008 (after nodules found)   From [place] lived there and [place]   Never owned birds      Social Determinants of Radio broadcast assistant Strain:   . Difficulty of Paying Living Expenses: Not on file  Food Insecurity:   . Worried About Charity fundraiser in the Last Year: Not on file  . Ran Out of Food in the Last Year: Not on file  Transportation Needs:   . Lack of Transportation (Medical): Not  on file  . Lack of Transportation (Non-Medical): Not on file  Physical Activity:   . Days of Exercise per Week: Not on file  . Minutes of Exercise per Session: Not on file  Stress:   . Feeling of Stress : Not on file  Social Connections:   . Frequency of Communication with Friends and Family: Not on file  . Frequency of Social Gatherings with Friends and Family: Not on file  . Attends Religious Services: Not on file  . Active Member of Clubs or Organizations: Not on file  . Attends Archivist Meetings: Not on file  . Marital Status: Not on file  Intimate Partner Violence:   . Fear of Current or Ex-Partner: Not on file  . Emotionally Abused: Not on file  . Physically Abused: Not on file  . Sexually Abused: Not on file    Past Surgical History:  Procedure Laterality Date  . ABDOMINAL HYSTERECTOMY    . BREAST BIOPSY Right   . CHOLECYSTECTOMY  2014  . COLONOSCOPY  05/04/2009   prolapsing external hemorrhoids  . ENDOMETRIAL ABLATION    . HEMORRHOID SURGERY     x 3  . TUBAL LIGATION      Family History  Problem Relation Age of Onset  . Heart disease Maternal Grandmother        aunts, uncles, sister  . Diabetes Mellitus II Maternal Grandmother   . Kidney failure Sister   . Diabetes Mellitus II Sister   . Lung cancer Other        aunts and uncles  . Colon cancer Maternal Aunt        aunts and uncles  . Diabetes Mellitus II Maternal Aunt   . Colon cancer Maternal Uncle   . Diabetes Mellitus II Maternal Uncle   . Diabetes type II Mother     No Known Allergies  Current Outpatient Medications on File Prior to Visit  Medication Sig Dispense Refill  . albuterol (VENTOLIN HFA) 108 (90 Base) MCG/ACT inhaler INHALE 2 PUFFS INTO THE LUNGS EVERY 4 (FOUR) HOURS AS NEEDED FOR WHEEZING OR SHORTNESS OF BREATH. 18 g 0  . aspirin-acetaminophen-caffeine (EXCEDRIN EXTRA STRENGTH) 250-250-65 MG per tablet Take 1 tablet by mouth every 6 (six) hours as needed (for migraines).      Marland Kitchen atorvastatin (LIPITOR) 20 MG tablet TAKE 1 TABLET BY MOUTH EVERY DAY 90 tablet 3  . benzonatate (TESSALON PERLES) 100 MG capsule Take 1 capsule (100 mg total) by mouth 3 (three) times daily as needed. 20 capsule 0  . levothyroxine (SYNTHROID) 100 MCG tablet TAKE 1 TABLET BY MOUTH EVERY DAY 90 tablet 0  . losartan-hydrochlorothiazide (HYZAAR) 100-25 MG tablet TAKE 1 TABLET BY MOUTH DAILY. **DUE FOR YEARLY PHYSICAL** 90 tablet 0  . montelukast (SINGULAIR) 10 MG tablet Take 1 tablet by mouth at bedtime. 90 tablet 1  . omeprazole (PRILOSEC) 20 MG capsule Take 1 capsule (20 mg total) by mouth daily. 30 capsule 3  . Polyethylene  Glycol 3350 (MIRALAX PO) Take by mouth.    . potassium chloride (KLOR-CON) 10 MEQ tablet Take 1 tablet (10 mEq total) by mouth daily. 90 tablet 0  . SUMAtriptan (IMITREX) 50 MG tablet Take 1 tablet (50 mg total) by mouth every 2 (two) hours as needed for migraine. May repeat in 2 hours if headache persists or recurs. 10 tablet 0  . Vitamin D, Ergocalciferol, (DRISDOL) 1.25 MG (50000 UNIT) CAPS capsule Take 1 capsule (50,000 Units total) by mouth every 7 (seven) days. 12 capsule 0   No current facility-administered medications on file prior to visit.    There were no vitals taken for this visit.      Objective:   Physical Exam Vitals and nursing note reviewed.  Constitutional:      General: She is not in acute distress.    Appearance: Normal appearance. She is well-developed. She is obese. She is not ill-appearing.  HENT:     Head: Normocephalic and atraumatic.     Right Ear: Tympanic membrane, ear canal and external ear normal. There is no impacted cerumen.     Left Ear: Tympanic membrane, ear canal and external ear normal. There is no impacted cerumen.     Nose: Nose normal. No congestion or rhinorrhea.     Mouth/Throat:     Mouth: Mucous membranes are moist.     Pharynx: Oropharynx is clear. No oropharyngeal exudate or posterior oropharyngeal erythema.  Eyes:      General:        Right eye: No discharge.        Left eye: No discharge.     Extraocular Movements: Extraocular movements intact.     Conjunctiva/sclera: Conjunctivae normal.     Pupils: Pupils are equal, round, and reactive to light.  Neck:     Thyroid: No thyromegaly.     Vascular: No carotid bruit.     Trachea: No tracheal deviation.  Cardiovascular:     Rate and Rhythm: Normal rate and regular rhythm.     Pulses: Normal pulses.     Heart sounds: Normal heart sounds. No murmur heard.  No friction rub. No gallop.   Pulmonary:     Effort: Pulmonary effort is normal. No respiratory distress.     Breath sounds: Normal breath sounds. No stridor. No wheezing, rhonchi or rales.  Chest:     Chest wall: No tenderness.  Abdominal:     General: Abdomen is flat. Bowel sounds are normal. There is no distension.     Palpations: Abdomen is soft. There is no mass.     Tenderness: There is no abdominal tenderness. There is no right CVA tenderness, left CVA tenderness, guarding or rebound.     Hernia: No hernia is present.  Musculoskeletal:        General: No swelling, tenderness, deformity or signs of injury. Normal range of motion.     Cervical back: Normal range of motion and neck supple.     Right lower leg: No edema.     Left lower leg: No edema.  Lymphadenopathy:     Cervical: No cervical adenopathy.  Skin:    General: Skin is warm and dry.     Capillary Refill: Capillary refill takes less than 2 seconds.     Coloration: Skin is not jaundiced or pale.     Findings: No bruising, erythema, lesion or rash.  Neurological:     General: No focal deficit present.     Mental Status: She is  alert and oriented to person, place, and time.     Cranial Nerves: No cranial nerve deficit.     Sensory: No sensory deficit.     Motor: No weakness.     Coordination: Coordination normal.     Gait: Gait normal.     Deep Tendon Reflexes: Reflexes normal.  Psychiatric:        Mood and Affect: Mood  normal.        Behavior: Behavior normal.        Thought Content: Thought content normal.        Judgment: Judgment normal.       Assessment & Plan:

## 2020-01-17 ENCOUNTER — Ambulatory Visit: Payer: BC Managed Care – PPO | Admitting: Adult Health

## 2020-01-21 ENCOUNTER — Other Ambulatory Visit: Payer: Self-pay | Admitting: Adult Health

## 2020-01-25 ENCOUNTER — Other Ambulatory Visit: Payer: Self-pay

## 2020-01-25 ENCOUNTER — Encounter: Payer: Self-pay | Admitting: Adult Health

## 2020-01-25 ENCOUNTER — Ambulatory Visit (INDEPENDENT_AMBULATORY_CARE_PROVIDER_SITE_OTHER): Payer: BC Managed Care – PPO | Admitting: Adult Health

## 2020-01-25 VITALS — BP 110/70 | HR 65 | Temp 98.6°F | Ht 67.0 in | Wt 181.0 lb

## 2020-01-25 DIAGNOSIS — G4733 Obstructive sleep apnea (adult) (pediatric): Secondary | ICD-10-CM | POA: Diagnosis not present

## 2020-01-25 DIAGNOSIS — E782 Mixed hyperlipidemia: Secondary | ICD-10-CM

## 2020-01-25 DIAGNOSIS — E039 Hypothyroidism, unspecified: Secondary | ICD-10-CM | POA: Diagnosis not present

## 2020-01-25 DIAGNOSIS — Z0000 Encounter for general adult medical examination without abnormal findings: Secondary | ICD-10-CM

## 2020-01-25 DIAGNOSIS — I10 Essential (primary) hypertension: Secondary | ICD-10-CM

## 2020-01-25 DIAGNOSIS — E559 Vitamin D deficiency, unspecified: Secondary | ICD-10-CM

## 2020-01-25 DIAGNOSIS — R05 Cough: Secondary | ICD-10-CM

## 2020-01-25 DIAGNOSIS — R053 Chronic cough: Secondary | ICD-10-CM

## 2020-01-25 MED ORDER — LOSARTAN POTASSIUM-HCTZ 100-25 MG PO TABS: 1.0000 | ORAL_TABLET | Freq: Every day | ORAL | 3 refills | Status: DC

## 2020-01-25 NOTE — Assessment & Plan Note (Signed)
Mild obstructive sleep apnea.  Patient says she is seeing her dentist next month and will discuss an oral appliance at that time.  She says she does not have too much daytime sleepiness.  Healthy sleep regimen discussed

## 2020-01-25 NOTE — Addendum Note (Signed)
Addended by: Marrion Coy on: 01/25/2020 02:24 PM   Modules accepted: Orders

## 2020-01-25 NOTE — Progress Notes (Signed)
@Patient  ID: [name], female    DOB: 1965/05/31, 54 y.o.   MRN: [id number]  Chief Complaint  Patient presents with   Follow-up    Cough     Referring provider: [name], NP  HPI: 54 year old female followed for mild obstructive sleep apnea and chronic cough  TEST/EVENTS :  HST 03/2017 mild OSA, AHI 12/hour  CT chest w contrast 01/2017 right lower lobe nodule has undergone growth from 6 to just under 9 mm although central calcification is now seen consistent with granulomatous change  01/25/2020 Follow up : OSA and cough  Patient returns for a 20-month follow-up.  Patient was seen last visit for a follow-up.  She does have mild obstructive sleep apnea.  Has been recommended to look into an oral appliance as she declined a CPAP.  She denies any significant daytime sleepiness.  Last visit patient complained of a chronic cough.  She was set up for a CT chest that showed stable small right upper lobe and middle lobe pulmonary nodules and benign-appearing granuloma in the right lung base.  Areas are stable since 2018.  There was some paraseptal emphysema at the lung apex right greater than left.  She was recommended to use some over-the-counter medications for cough with possible trigger of postnasal drainage with Chlor-Trimeton. Since last visit she is feeling better her cough has resolved.  She denies any significant breathing problems.   No Known Allergies  Immunization History  Administered Date(s) Administered   Influenza Split 02/03/2017   Moderna SARS-COVID-2 Vaccination 07/29/2019, 08/26/2019   Td 05/07/2007   Tdap 05/06/2014    Past Medical History:  Diagnosis Date   Bell's palsy    with res left lag   Essential hypertension    External hemorrhoids    History of pneumonia    Hyperlipidemia    Hyperthyroidism    hx 54 years old- oral meds   Iron deficiency anemia    Migraine headache    PPD positive    Pulmonary nodules    not seen  on plain cxr, first detected 1/07 re ct 10/08 pos IPPD > 15 mm 12/09 FOB/lavage 04/20/08 neg afb smear   PVC's (premature ventricular contractions)     Tobacco History: Social History   Tobacco Use  Smoking Status Never Smoker  Smokeless Tobacco Never Used   Counseling given: Not Answered   Outpatient Medications Prior to Visit  Medication Sig Dispense Refill   albuterol (VENTOLIN HFA) 108 (90 Base) MCG/ACT inhaler INHALE 2 PUFFS INTO THE LUNGS EVERY 4 (FOUR) HOURS AS NEEDED FOR WHEEZING OR SHORTNESS OF BREATH. 18 g 0   aspirin-acetaminophen-caffeine (EXCEDRIN EXTRA STRENGTH) 250-250-65 MG per tablet Take 1 tablet by mouth every 6 (six) hours as needed (for migraines).      atorvastatin (LIPITOR) 20 MG tablet TAKE 1 TABLET BY MOUTH EVERY DAY 90 tablet 3   benzonatate (TESSALON PERLES) 100 MG capsule Take 1 capsule (100 mg total) by mouth 3 (three) times daily as needed. 20 capsule 0   levothyroxine (SYNTHROID) 100 MCG tablet TAKE 1 TABLET BY MOUTH EVERY DAY 90 tablet 0   losartan-hydrochlorothiazide (HYZAAR) 100-25 MG tablet Take 1 tablet by mouth daily. 90 tablet 3   montelukast (SINGULAIR) 10 MG tablet Take 1 tablet by mouth at bedtime. 90 tablet 1   omeprazole (PRILOSEC) 20 MG capsule Take 1 capsule (20 mg total) by mouth daily. 30 capsule 3   Polyethylene Glycol 3350 (MIRALAX PO) Take by mouth.  potassium chloride (KLOR-CON) 10 MEQ tablet Take 1 tablet (10 mEq total) by mouth daily. 90 tablet 0   SUMAtriptan (IMITREX) 50 MG tablet Take 1 tablet (50 mg total) by mouth every 2 (two) hours as needed for migraine. May repeat in 2 hours if headache persists or recurs. 10 tablet 0   Vitamin D, Ergocalciferol, (DRISDOL) 1.25 MG (50000 UNIT) CAPS capsule Take 1 capsule (50,000 Units total) by mouth every 7 (seven) days. 12 capsule 0   No facility-administered medications prior to visit.     Review of Systems:   Constitutional:   No  weight loss, night sweats,  Fevers,  chills, fatigue, or  lassitude.  HEENT:   No headaches,  Difficulty swallowing,  Tooth/dental problems, or  Sore throat,                No sneezing, itching, ear ache, nasal congestion, post nasal drip,   CV:  No chest pain,  Orthopnea, PND, swelling in lower extremities, anasarca, dizziness, palpitations, syncope.   GI  No heartburn, indigestion, abdominal pain, nausea, vomiting, diarrhea, change in bowel habits, loss of appetite, bloody stools.   Resp: No shortness of breath with exertion or at rest.  No excess mucus, no productive cough,  No non-productive cough,  No coughing up of blood.  No change in color of mucus.  No wheezing.  No chest wall deformity  Skin: no rash or lesions.  GU: no dysuria, change in color of urine, no urgency or frequency.  No flank pain, no hematuria   MS:  No joint pain or swelling.  No decreased range of motion.  No back pain.    Physical Exam  BP 112/62 (BP Location: Left Arm, Cuff Size: Normal)    Pulse 70    Temp (!) 96 F (35.6 C) (Temporal)    Ht 5\' 8"  (1.727 m)    Wt 182 lb 6.4 oz (82.7 kg)    SpO2 98% Comment: RA   BMI 27.73 kg/m   GEN: A/Ox3; pleasant , NAD, well nourished    HEENT:  [place]/AT,  NOSE-clear, THROAT-clear, no lesions, no postnasal drip or exudate noted.   NECK:  Supple w/ fair ROM; no JVD; normal carotid impulses w/o bruits; no thyromegaly or nodules palpated; no lymphadenopathy.    RESP  Clear  P & A; w/o, wheezes/ rales/ or rhonchi. no accessory muscle use, no dullness to percussion  CARD:  RRR, no m/r/g, no peripheral edema, pulses intact, no cyanosis or clubbing.  GI:   Soft & nt; nml bowel sounds; no organomegaly or masses detected.   Musco: Warm bil, no deformities or joint swelling noted.   Neuro: alert, no focal deficits noted.    Skin: Warm, no lesions or rashes    Lab Results:  CBC  BMET  BNP No results found for: BNP  ProBNP No results found for: PROBNP  Imaging: No results found.    No  flowsheet data found.  No results found for: NITRICOXIDE      Assessment & Plan:   OSA (obstructive sleep apnea) Mild obstructive sleep apnea.  Patient says she is seeing her dentist next month and will discuss an oral appliance at that time.  She says she does not have too much daytime sleepiness.  Healthy sleep regimen discussed  Chronic cough Upper airway cough-patient was treated for possible trigger of postnasal drainage.  Symptoms improved on antihistamine.  CT chest showed stable pulmonary nodules.  Patient did have some paraseptal emphysema  which patient is a never smoker.  Questionable etiology.  She has no significant breathing problems advised if she begins to having frequent cough or shortness of breath we can order pulmonary function testing and possibly an alpha 1 antitrypsin level if needed.  However clinically she is stable with no symptoms currently.     Rexene Edison, NP 01/25/2020

## 2020-01-25 NOTE — Patient Instructions (Signed)
Claritin or Zyrtec daily As needed  Drainage  Look into Oral appliance as discussed.  Follow up Dr. Elsworth Soho  In 1 year and As needed

## 2020-01-25 NOTE — Progress Notes (Signed)
Subjective:    Patient ID: [name], female    DOB: [birth year], 54 y.o.   MRN: [id number]  HPI Patient presents for yearly preventative medicine examination. She is a pleasant 54 year old female who  has a past medical history of Bell's palsy, Essential hypertension, External hemorrhoids, History of pneumonia, Hyperlipidemia, Hyperthyroidism, Iron deficiency anemia, Migraine headache, PPD positive, Pulmonary nodules, and PVC's (premature ventricular contractions).  Hyperlipidemia-currently prescribed Lipitor 20 mg daily.  She denies myalgia or fatigue  Lab Results  Component Value Date   CHOL 248 (H) 04/08/2018   HDL 27.70 (L) 04/08/2018   LDLCALC 179 (H) 01/20/2017   LDLDIRECT 180.0 04/08/2018   TRIG 243.0 (H) 04/08/2018   CHOLHDL 9 04/08/2018   Hypertension -prescribed Hyzaar 100-25 mg daily.  She denies dizziness, lightheadedness, chest pain, or shortness of breath BP Readings from Last 3 Encounters:  01/25/20 110/70  11/09/19 94/64  11/03/19 124/84   Hypothyroidism -currently maintained on Synthroid 100 mcg daily. She does not always remember to take this medication Lab Results  Component Value Date   TSH 8.63 (H) 11/03/2019   Vitamin D Deficiency -she is currently prescribed 50,000 units weekly Last vitamin D Lab Results  Component Value Date   VD25OH 10.90 (L) 11/03/2019   OSA - refuses to go on CPAP   All immunizations and health maintenance protocols were reviewed with the patient and needed orders were placed.  Appropriate screening laboratory values were ordered for the patient including screening of hyperlipidemia, renal function and hepatic function.  Medication reconciliation,  past medical history, social history, problem list and allergies were reviewed in detail with the patient  Goals were established with regard to weight loss, exercise, and  diet in compliance with medications.  Wt Readings from Last 3 Encounters:  01/25/20 181 lb (82.1 kg)    11/09/19 184 lb (83.5 kg)  11/03/19 185 lb (83.9 kg)   She is up to date on routine screening colonoscopy and mammograms  Review of Systems  Constitutional: Negative.   HENT: Negative.   Eyes: Negative.   Respiratory: Negative.   Cardiovascular: Negative.   Gastrointestinal: Negative.   Endocrine: Negative.   Genitourinary: Negative.   Musculoskeletal: Negative.   Skin: Negative.   Allergic/Immunologic: Negative.   Neurological: Negative.   Hematological: Negative.   Psychiatric/Behavioral: Negative.    Past Medical History:  Diagnosis Date  . Bell's palsy    with res left lag  . Essential hypertension   . External hemorrhoids   . History of pneumonia   . Hyperlipidemia   . Hyperthyroidism    hx 54 years old- oral meds  . Iron deficiency anemia   . Migraine headache   . PPD positive   . Pulmonary nodules    not seen on plain cxr, first detected 1/07 re ct 10/08 pos IPPD > 15 mm 12/09 FOB/lavage 04/20/08 neg afb smear  . PVC's (premature ventricular contractions)     Social History   Socioeconomic History  . Marital status: Divorced    Spouse name: Not on file  . Number of children: 2  . Years of education: Not on file  . Highest education level: Not on file  Occupational History  . Occupation: accounts Sport and exercise psychologist: [employer]  Tobacco Use  . Smoking status: Never Smoker  . Smokeless tobacco: Never Used  Substance and Sexual Activity  . Alcohol use: No    Alcohol/week: 0.0 standard drinks  . Drug use: No  .  Sexual activity: Not on file  Other Topics Concern  . Not on file  Social History Narrative   Divorced with children   She works in Press photographer    Possible TB exposure 2008 (after nodules found)   From [place] lived there and [place]   Never owned birds      Social Determinants of Radio broadcast assistant Strain:   . Difficulty of Paying Living Expenses: Not on file  Food Insecurity:   . Worried About Charity fundraiser in the Last Year: Not  on file  . Ran Out of Food in the Last Year: Not on file  Transportation Needs:   . Lack of Transportation (Medical): Not on file  . Lack of Transportation (Non-Medical): Not on file  Physical Activity:   . Days of Exercise per Week: Not on file  . Minutes of Exercise per Session: Not on file  Stress:   . Feeling of Stress : Not on file  Social Connections:   . Frequency of Communication with Friends and Family: Not on file  . Frequency of Social Gatherings with Friends and Family: Not on file  . Attends Religious Services: Not on file  . Active Member of Clubs or Organizations: Not on file  . Attends Archivist Meetings: Not on file  . Marital Status: Not on file  Intimate Partner Violence:   . Fear of Current or Ex-Partner: Not on file  . Emotionally Abused: Not on file  . Physically Abused: Not on file  . Sexually Abused: Not on file    Past Surgical History:  Procedure Laterality Date  . ABDOMINAL HYSTERECTOMY    . BREAST BIOPSY Right   . CHOLECYSTECTOMY  2014  . COLONOSCOPY  05/04/2009   prolapsing external hemorrhoids  . ENDOMETRIAL ABLATION    . HEMORRHOID SURGERY     x 3  . TUBAL LIGATION      Family History  Problem Relation Age of Onset  . Heart disease Maternal Grandmother        aunts, uncles, sister  . Diabetes Mellitus II Maternal Grandmother   . Kidney failure Sister   . Diabetes Mellitus II Sister   . Lung cancer Other        aunts and uncles  . Colon cancer Maternal Aunt        aunts and uncles  . Diabetes Mellitus II Maternal Aunt   . Colon cancer Maternal Uncle   . Diabetes Mellitus II Maternal Uncle   . Diabetes type II Mother     No Known Allergies  Current Outpatient Medications on File Prior to Visit  Medication Sig Dispense Refill  . albuterol (VENTOLIN HFA) 108 (90 Base) MCG/ACT inhaler INHALE 2 PUFFS INTO THE LUNGS EVERY 4 (FOUR) HOURS AS NEEDED FOR WHEEZING OR SHORTNESS OF BREATH. 18 g 0  . aspirin-acetaminophen-caffeine  (EXCEDRIN EXTRA STRENGTH) 250-250-65 MG per tablet Take 1 tablet by mouth every 6 (six) hours as needed (for migraines).     Marland Kitchen atorvastatin (LIPITOR) 20 MG tablet TAKE 1 TABLET BY MOUTH EVERY DAY 90 tablet 3  . benzonatate (TESSALON PERLES) 100 MG capsule Take 1 capsule (100 mg total) by mouth 3 (three) times daily as needed. 20 capsule 0  . levothyroxine (SYNTHROID) 100 MCG tablet TAKE 1 TABLET BY MOUTH EVERY DAY 90 tablet 0  . losartan-hydrochlorothiazide (HYZAAR) 100-25 MG tablet TAKE 1 TABLET BY MOUTH DAILY. **DUE FOR YEARLY PHYSICAL** 90 tablet 0  . montelukast (SINGULAIR) 10 MG  tablet Take 1 tablet by mouth at bedtime. 90 tablet 1  . omeprazole (PRILOSEC) 20 MG capsule Take 1 capsule (20 mg total) by mouth daily. 30 capsule 3  . Polyethylene Glycol 3350 (MIRALAX PO) Take by mouth.    . potassium chloride (KLOR-CON) 10 MEQ tablet Take 1 tablet (10 mEq total) by mouth daily. 90 tablet 0  . SUMAtriptan (IMITREX) 50 MG tablet Take 1 tablet (50 mg total) by mouth every 2 (two) hours as needed for migraine. May repeat in 2 hours if headache persists or recurs. 10 tablet 0  . Vitamin D, Ergocalciferol, (DRISDOL) 1.25 MG (50000 UNIT) CAPS capsule Take 1 capsule (50,000 Units total) by mouth every 7 (seven) days. 12 capsule 0   No current facility-administered medications on file prior to visit.    BP 110/70 (BP Location: Left Arm, Patient Position: Sitting, Cuff Size: Normal)   Pulse 65   Temp 98.6 F (37 C) (Oral)   Ht '5\' 7"'  (1.702 m)   Wt 181 lb (82.1 kg)   SpO2 97%   BMI 28.35 kg/m       Objective:   Physical Exam Vitals and nursing note reviewed.  Constitutional:      General: She is not in acute distress.    Appearance: Normal appearance. She is well-developed. She is not ill-appearing.  HENT:     Head: Normocephalic and atraumatic.     Right Ear: Tympanic membrane, ear canal and external ear normal. There is no impacted cerumen.     Left Ear: Tympanic membrane, ear canal and  external ear normal. There is no impacted cerumen.     Nose: Nose normal. No congestion or rhinorrhea.     Mouth/Throat:     Mouth: Mucous membranes are moist.     Pharynx: Oropharynx is clear. No oropharyngeal exudate or posterior oropharyngeal erythema.  Eyes:     General:        Right eye: No discharge.        Left eye: No discharge.     Extraocular Movements: Extraocular movements intact.     Conjunctiva/sclera: Conjunctivae normal.     Pupils: Pupils are equal, round, and reactive to light.  Neck:     Thyroid: No thyromegaly.     Vascular: No carotid bruit.     Trachea: No tracheal deviation.  Cardiovascular:     Rate and Rhythm: Normal rate and regular rhythm.     Pulses: Normal pulses.     Heart sounds: Normal heart sounds. No murmur heard.  No friction rub. No gallop.   Pulmonary:     Effort: Pulmonary effort is normal. No respiratory distress.     Breath sounds: Normal breath sounds. No stridor. No wheezing, rhonchi or rales.  Chest:     Chest wall: No tenderness.  Abdominal:     General: Abdomen is flat. Bowel sounds are normal. There is no distension.     Palpations: Abdomen is soft. There is no mass.     Tenderness: There is no abdominal tenderness. There is no right CVA tenderness, left CVA tenderness, guarding or rebound.     Hernia: No hernia is present.  Musculoskeletal:        General: No swelling, tenderness, deformity or signs of injury. Normal range of motion.     Cervical back: Normal range of motion and neck supple.     Right lower leg: No edema.     Left lower leg: No edema.  Lymphadenopathy:  Cervical: No cervical adenopathy.  Skin:    General: Skin is warm and dry.     Coloration: Skin is not jaundiced or pale.     Findings: No bruising, erythema, lesion or rash.  Neurological:     General: No focal deficit present.     Mental Status: She is alert and oriented to person, place, and time.     Cranial Nerves: No cranial nerve deficit.      Sensory: No sensory deficit.     Motor: No weakness.     Coordination: Coordination normal.     Gait: Gait normal.     Deep Tendon Reflexes: Reflexes normal.  Psychiatric:        Mood and Affect: Mood normal.        Behavior: Behavior normal.        Thought Content: Thought content normal.        Judgment: Judgment normal.       Assessment & Plan:  1. Routine general medical examination at a health care facility - Encouraged routine exercise and heart healthy diet  - Follow up in one year or sooner if needed - CMP with eGFR([facility]); Future - CBC with Differential/Platelet; Future - Hemoglobin A1c; Future - Lipid panel; Future - TSH; Future  2. Hypothyroidism, unspecified type - Consider change in dose of synthroid  - CMP with eGFR([facility]); Future - CBC with Differential/Platelet; Future - Hemoglobin A1c; Future - Lipid panel; Future - TSH; Future  3. Mixed hyperlipidemia - Consider increase in statin  - CMP with eGFR([facility]); Future - CBC with Differential/Platelet; Future - Hemoglobin A1c; Future - Lipid panel; Future - TSH; Future  4. Essential hypertension - Well controlled. No change in medications  - CMP with eGFR([facility]); Future - CBC with Differential/Platelet; Future - Hemoglobin A1c; Future - Lipid panel; Future - TSH; Future - losartan-hydrochlorothiazide (HYZAAR) 100-25 MG tablet; Take 1 tablet by mouth daily.  Dispense: 90 tablet; Refill: 3  5. Vitamin D deficiency - Consider decreasing Vitamin D  - VITAMIN D 25 Hydroxy (Vit-D Deficiency, Fractures); Future   [name], NP

## 2020-01-25 NOTE — Assessment & Plan Note (Signed)
Upper airway cough-patient was treated for possible trigger of postnasal drainage.  Symptoms improved on antihistamine.  CT chest showed stable pulmonary nodules.  Patient did have some paraseptal emphysema which patient is a never smoker.  Questionable etiology.  She has no significant breathing problems advised if she begins to having frequent cough or shortness of breath we can order pulmonary function testing and possibly an alpha 1 antitrypsin level if needed.  However clinically she is stable with no symptoms currently.

## 2020-01-26 ENCOUNTER — Other Ambulatory Visit: Payer: Self-pay | Admitting: Adult Health

## 2020-01-26 LAB — CBC WITH DIFFERENTIAL/PLATELET
Absolute Monocytes: 302 cells/uL (ref 200–950)
Basophils Absolute: 41 cells/uL (ref 0–200)
Basophils Relative: 0.7 %
Eosinophils Absolute: 168 cells/uL (ref 15–500)
Eosinophils Relative: 2.9 %
HCT: 39.9 % (ref 35.0–45.0)
Hemoglobin: 13.4 g/dL (ref 11.7–15.5)
Lymphs Abs: 2593 cells/uL (ref 850–3900)
MCH: 29.7 pg (ref 27.0–33.0)
MCHC: 33.6 g/dL (ref 32.0–36.0)
MCV: 88.5 fL (ref 80.0–100.0)
MPV: 10.1 fL (ref 7.5–12.5)
Monocytes Relative: 5.2 %
Neutro Abs: 2697 cells/uL (ref 1500–7800)
Neutrophils Relative %: 46.5 %
Platelets: 237 10*3/uL (ref 140–400)
RBC: 4.51 10*6/uL (ref 3.80–5.10)
RDW: 13.4 % (ref 11.0–15.0)
Total Lymphocyte: 44.7 %
WBC: 5.8 10*3/uL (ref 3.8–10.8)

## 2020-01-26 LAB — COMPLETE METABOLIC PANEL WITH GFR
AG Ratio: 2.1 (calc) (ref 1.0–2.5)
ALT: 21 U/L (ref 6–29)
AST: 19 U/L (ref 10–35)
Albumin: 4.6 g/dL (ref 3.6–5.1)
Alkaline phosphatase (APISO): 83 U/L (ref 37–153)
BUN: 10 mg/dL (ref 7–25)
CO2: 31 mmol/L (ref 20–32)
Calcium: 9.8 mg/dL (ref 8.6–10.4)
Chloride: 102 mmol/L (ref 98–110)
Creat: 0.92 mg/dL (ref 0.50–1.05)
GFR, Est African American: 83 mL/min/{1.73_m2} (ref >=60)
GFR, Est Non African American: 72 mL/min/{1.73_m2} (ref >=60)
Globulin: 2.2 g/dL (calc) (ref 1.9–3.7)
Glucose, Bld: 82 mg/dL (ref 65–99)
Potassium: 3.5 mmol/L (ref 3.5–5.3)
Sodium: 141 mmol/L (ref 135–146)
Total Bilirubin: 1.5 mg/dL — ABNORMAL HIGH (ref 0.2–1.2)
Total Protein: 6.8 g/dL (ref 6.1–8.1)

## 2020-01-26 LAB — LIPID PANEL
Cholesterol: 169 mg/dL (ref <200)
HDL: 30 mg/dL — ABNORMAL LOW (ref >=50)
LDL Cholesterol (Calc): 119 mg/dL (calc) — ABNORMAL HIGH
Non-HDL Cholesterol (Calc): 139 mg/dL (calc) — ABNORMAL HIGH (ref <130)
Total CHOL/HDL Ratio: 5.6 (calc) — ABNORMAL HIGH (ref <5.0)
Triglycerides: 102 mg/dL (ref <150)

## 2020-01-26 LAB — HEMOGLOBIN A1C
Hgb A1c MFr Bld: 5.7 % of total Hgb — ABNORMAL HIGH (ref <5.7)
Mean Plasma Glucose: 117 (calc)
eAG (mmol/L): 6.5 (calc)

## 2020-01-26 LAB — TSH: TSH: 2.57 mIU/L

## 2020-01-26 LAB — VITAMIN D 25 HYDROXY (VIT D DEFICIENCY, FRACTURES): Vit D, 25-Hydroxy: 68 ng/mL (ref 30–100)

## 2020-01-26 MED ORDER — LEVOTHYROXINE SODIUM 100 MCG PO TABS: 100.0000 ug | ORAL_TABLET | Freq: Every day | ORAL | 3 refills | Status: DC

## 2020-01-26 MED ORDER — POTASSIUM CHLORIDE ER 10 MEQ PO TBCR: 10.0000 meq | EXTENDED_RELEASE_TABLET | Freq: Every day | ORAL | 3 refills | Status: DC

## 2020-01-26 MED ORDER — DIALYVITE VITAMIN D 5000 125 MCG (5000 UT) PO CAPS: 5000.0000 [IU] | ORAL_CAPSULE | Freq: Every day | ORAL | 3 refills | Status: DC

## 2020-03-23 ENCOUNTER — Telehealth (INDEPENDENT_AMBULATORY_CARE_PROVIDER_SITE_OTHER): Payer: BC Managed Care – PPO | Admitting: Family Medicine

## 2020-03-23 DIAGNOSIS — R0981 Nasal congestion: Secondary | ICD-10-CM | POA: Diagnosis not present

## 2020-03-23 DIAGNOSIS — J111 Influenza due to unidentified influenza virus with other respiratory manifestations: Secondary | ICD-10-CM

## 2020-03-23 DIAGNOSIS — R059 Cough, unspecified: Secondary | ICD-10-CM

## 2020-03-23 MED ORDER — OSELTAMIVIR PHOSPHATE 75 MG PO CAPS: 75.0000 mg | ORAL_CAPSULE | Freq: Two times a day (BID) | ORAL | 0 refills | Status: DC

## 2020-03-23 MED ORDER — BENZONATATE 100 MG PO CAPS: 100.0000 mg | ORAL_CAPSULE | Freq: Three times a day (TID) | ORAL | 0 refills | Status: DC | PRN

## 2020-03-23 NOTE — Patient Instructions (Signed)
    HOME CARE TIPS:  -[facility] [place] testing information: [email or web address] OR [phone] Most pharmacies also offer testing and home test kits.  -I sent the medication(s) we discussed to your pharmacy: Meds ordered this encounter  Medications  . oseltamivir (TAMIFLU) 75 MG capsule    Sig: Take 1 capsule (75 mg total) by mouth 2 (two) times daily.    Dispense:  10 capsule    Refill:  0  . benzonatate (TESSALON PERLES) 100 MG capsule    Sig: Take 1 capsule (100 mg total) by mouth 3 (three) times daily as needed.    Dispense:  20 capsule    Refill:  0     -COVID19 outpatient treatment center: [phone] (only call if your Covid test is positive and you are interested in monoclonal antibody treatment which is available to those with risk factors within 10 days of symptom onset)  -can use tylenol or aleve if needed for fevers, aches and pains per instructions, do not mix with other over-the-counter cough and cold medications or other pain medications.  -can use nasal saline a few times per day if nasal congestion, sometime a short course of Afrin nasal spray for 3 days can help as well  -stay hydrated, drink plenty of fluids and eat small healthy meals - avoid dairy  -can take 1000 IU Vit D3 and Vit C lozenges per instructions  -If the Covid test is positive, check out the CDC website for more information on home care, transmission and treatment for COVID19  -follow up with your doctor in 2-3 days unless improving and feeling better  -stay home while sick, except to seek medical care, and if you have [place] please stay home for a full 10 days since the onset of symptoms PLUS one day of no fever and feeling better.  It was nice to meet you today, and I really hope you are feeling better soon. I help [facility] out with telemedicine visits on Tuesdays and Thursdays and am available for visits on those days. If you have any concerns or  questions following this visit please schedule a follow up visit with your Primary Care doctor or seek care at a local urgent care clinic to avoid delays in care.    Seek in person care promptly if your symptoms worsen, new concerns arise or you are not improving with treatment. Call 911 and/or seek emergency care if you symptoms are severe or life threatening.

## 2020-03-23 NOTE — Progress Notes (Signed)
Virtual Visit via Video Note  I connected with Tj  on 03/23/20 at  5:20 PM EST by a video enabled telemedicine application and verified that I am speaking with the correct person using two identifiers.  Location patient: home, [place] Location provider:work or home office Persons participating in the virtual visit: patient, provider  I discussed the limitations of evaluation and management by telemedicine and the availability of in person appointments. The patient expressed understanding and agreed to proceed.   HPI:  Acute telemedicine visit for sinus issues: -Onset: yesterday -Symptoms include: nasal congestion, upper respiratory symptoms, low grade temp around 100-101, chills,  Bodyaches, sore throat -Denies:SOB, CP, NVD, poor appetite loss of taste or smell, known sick contacts, inability to get out of bed/eat and drink -Has tried: nyquil, tylenol cold and flu -Pertinent past medical history: HTN, asthma on problem list - but pt reports no dx of asthma -COVID-19 vaccine status: fully vaccinated covid, not vaccinated flu  ROS: See pertinent positives and negatives per HPI.  Past Medical History:  Diagnosis Date  . Bell's palsy    with res left lag  . Essential hypertension   . External hemorrhoids   . History of pneumonia   . Hyperlipidemia   . Hyperthyroidism    hx 54 years old- oral meds  . Iron deficiency anemia   . Migraine headache   . PPD positive   . Pulmonary nodules    not seen on plain cxr, first detected 1/07 re ct 10/08 pos IPPD > 15 mm 12/09 FOB/lavage 04/20/08 neg afb smear  . PVC's (premature ventricular contractions)     Past Surgical History:  Procedure Laterality Date  . ABDOMINAL HYSTERECTOMY    . BREAST BIOPSY Right   . CHOLECYSTECTOMY  2014  . COLONOSCOPY  05/04/2009   prolapsing external hemorrhoids  . ENDOMETRIAL ABLATION    . HEMORRHOID SURGERY     x 3  . TUBAL LIGATION       Current Outpatient Medications:  .  albuterol (VENTOLIN HFA) 108  (90 Base) MCG/ACT inhaler, INHALE 2 PUFFS INTO THE LUNGS EVERY 4 (FOUR) HOURS AS NEEDED FOR WHEEZING OR SHORTNESS OF BREATH., Disp: 18 g, Rfl: 0 .  aspirin-acetaminophen-caffeine (EXCEDRIN EXTRA STRENGTH) 622-297-98 MG per tablet, Take 1 tablet by mouth every 6 (six) hours as needed (for migraines). , Disp: , Rfl:  .  atorvastatin (LIPITOR) 20 MG tablet, TAKE 1 TABLET BY MOUTH EVERY DAY, Disp: 90 tablet, Rfl: 3 .  benzonatate (TESSALON PERLES) 100 MG capsule, Take 1 capsule (100 mg total) by mouth 3 (three) times daily as needed., Disp: 20 capsule, Rfl: 0 .  Cholecalciferol (DIALYVITE VITAMIN D 5000) 125 MCG (5000 UT) capsule, Take 1 capsule (5,000 Units total) by mouth daily., Disp: 90 capsule, Rfl: 3 .  levothyroxine (SYNTHROID) 100 MCG tablet, Take 1 tablet (100 mcg total) by mouth daily., Disp: 90 tablet, Rfl: 3 .  losartan-hydrochlorothiazide (HYZAAR) 100-25 MG tablet, Take 1 tablet by mouth daily., Disp: 90 tablet, Rfl: 3 .  montelukast (SINGULAIR) 10 MG tablet, Take 1 tablet by mouth at bedtime., Disp: 90 tablet, Rfl: 1 .  omeprazole (PRILOSEC) 20 MG capsule, Take 1 capsule (20 mg total) by mouth daily., Disp: 30 capsule, Rfl: 3 .  oseltamivir (TAMIFLU) 75 MG capsule, Take 1 capsule (75 mg total) by mouth 2 (two) times daily., Disp: 10 capsule, Rfl: 0 .  Polyethylene Glycol 3350 (MIRALAX PO), Take by mouth., Disp: , Rfl:  .  potassium chloride (KLOR-CON) 10 MEQ  tablet, Take 1 tablet (10 mEq total) by mouth daily., Disp: 90 tablet, Rfl: 3 .  SUMAtriptan (IMITREX) 50 MG tablet, Take 1 tablet (50 mg total) by mouth every 2 (two) hours as needed for migraine. May repeat in 2 hours if headache persists or recurs., Disp: 10 tablet, Rfl: 0  EXAM:  VITALS per patient if applicable:  GENERAL: alert, oriented, appears well and in no acute distress  HEENT: atraumatic, conjunttiva clear, no obvious abnormalities on inspection of external nose and ears  NECK: normal movements of the head and  neck  LUNGS: on inspection no signs of respiratory distress, breathing rate appears normal, no obvious gross SOB, gasping or wheezing  CV: no obvious cyanosis  MS: moves all visible extremities without noticeable abnormality  PSYCH/NEURO: pleasant and cooperative, no obvious depression or anxiety, speech and thought processing grossly intact  ASSESSMENT AND PLAN:  Discussed the following assessment and plan:  Influenza-like illness  Nasal congestion  Cough  -we discussed possible serious and likely etiologies, options for evaluation and workup, limitations of telemedicine visit vs in person visit, treatment, treatment risks and precautions. Pt prefers to treat via telemedicine empirically rather than in person at this moment.  Query influenza versus breakthrough Covid versus other.  She opted for starting Tamiflu in case of influenza after discussion of risk and benefits.  She agrees to [employer] testing and discussed potential complications, precautions and treatment.  Provided [facility] center contact information in case she has a positive Covid test.  Sent Tessalon for cough to her pharmacy.  Other symptomatic home care summarized in patient instructions.  Advised to stay home while sick and for full 10 days of Covid test is positive. Work/School slipped offered:declined Scheduled follow up with PCP offered: Agrees to follow-up as needed Advised to seek prompt in person care if worsening, new symptoms arise, or if is not improving with treatment. Discussed options for inperson care if PCP office not available. Did let this patient know that I only do telemedicine on Tuesdays and Thursdays for [place]. Advised to schedule follow up visit with PCP or [facility] if any further questions or concerns to avoid delays in care.   I discussed the assessment and treatment plan with the patient. The patient was provided an opportunity to ask questions and all were answered. The patient agreed with the plan  and demonstrated an understanding of the instructions.     Lucretia Kern, DO

## 2020-05-02 ENCOUNTER — Other Ambulatory Visit: Payer: Self-pay | Admitting: Adult Health

## 2020-05-02 DIAGNOSIS — I10 Essential (primary) hypertension: Secondary | ICD-10-CM

## 2020-05-03 MED ORDER — HYDROCHLOROTHIAZIDE 25 MG PO TABS: 25.0000 mg | ORAL_TABLET | Freq: Every day | ORAL | 0 refills | Status: DC

## 2020-05-18 ENCOUNTER — Telehealth: Payer: Self-pay | Admitting: Adult Health

## 2020-05-18 DIAGNOSIS — G43719 Chronic migraine without aura, intractable, without status migrainosus: Secondary | ICD-10-CM

## 2020-05-18 NOTE — Telephone Encounter (Signed)
Patient is calling and requesting a refill for SUMAtriptan (IMITREX) 50 MG tablet sent to  [id number] - [place], [address], [place] [address]  Phone:  [phone] Fax:  [phone]  CB is [phone]

## 2020-05-19 MED ORDER — SUMATRIPTAN SUCCINATE 50 MG PO TABS: 50.0000 mg | ORAL_TABLET | ORAL | 2 refills | Status: DC | PRN

## 2020-05-19 NOTE — Telephone Encounter (Signed)
Sent to the pharmacy.  Verbal authorization gave by [name].  Nothing further needed.

## 2020-07-11 ENCOUNTER — Other Ambulatory Visit: Payer: Self-pay

## 2020-07-11 ENCOUNTER — Ambulatory Visit (INDEPENDENT_AMBULATORY_CARE_PROVIDER_SITE_OTHER): Payer: BC Managed Care – PPO | Admitting: Adult Health

## 2020-07-11 ENCOUNTER — Ambulatory Visit (INDEPENDENT_AMBULATORY_CARE_PROVIDER_SITE_OTHER): Payer: BC Managed Care – PPO

## 2020-07-11 ENCOUNTER — Encounter: Payer: Self-pay | Admitting: Adult Health

## 2020-07-11 VITALS — BP 146/90 | HR 73 | Temp 98.6°F | Ht 67.0 in | Wt 185.2 lb

## 2020-07-11 DIAGNOSIS — M19072 Primary osteoarthritis, left ankle and foot: Secondary | ICD-10-CM | POA: Diagnosis not present

## 2020-07-11 DIAGNOSIS — M79672 Pain in left foot: Secondary | ICD-10-CM | POA: Diagnosis not present

## 2020-07-11 DIAGNOSIS — M85872 Other specified disorders of bone density and structure, left ankle and foot: Secondary | ICD-10-CM | POA: Diagnosis not present

## 2020-07-11 MED ORDER — TRAMADOL HCL 50 MG PO TABS: 50.0000 mg | ORAL_TABLET | Freq: Three times a day (TID) | ORAL | 0 refills | Status: AC | PRN

## 2020-07-11 NOTE — Progress Notes (Signed)
Subjective:    Patient ID: [name], female    DOB: April 11, 1966, 55 y.o.   MRN: [id number]  HPI 55 year old female who  has a past medical history of Bell's palsy, Essential hypertension, External hemorrhoids, History of pneumonia, Hyperlipidemia, Hyperthyroidism, Iron deficiency anemia, Migraine headache, PPD positive, Pulmonary nodules, and PVC's (premature ventricular contractions).  She presents to the clinic today for an acute issue. She reports that over the weekend ( three days ago) she developed some pain to the top of her left foot.  She cannot remember any injury or trauma but believes that she may have stepped off something and possibly twisted her ankle?.  Reports that since that time she has had pain along the top of her left foot that radiates around to the back of the heel.  Pain is worse with ambulation but the pain is constant throughout the day.  She has been using over-the-counter anti-inflammatories with little relief.  She has not noticed any bruising.  Has had some mild edema to the top of the left foot   Review of Systems See HPI   Past Medical History:  Diagnosis Date  . Bell's palsy    with res left lag  . Essential hypertension   . External hemorrhoids   . History of pneumonia   . Hyperlipidemia   . Hyperthyroidism    hx 55 years old- oral meds  . Iron deficiency anemia   . Migraine headache   . PPD positive   . Pulmonary nodules    not seen on plain cxr, first detected 1/07 re ct 10/08 pos IPPD > 15 mm 12/09 FOB/lavage 04/20/08 neg afb smear  . PVC's (premature ventricular contractions)     Social History   Socioeconomic History  . Marital status: Divorced    Spouse name: Not on file  . Number of children: 2  . Years of education: Not on file  . Highest education level: Not on file  Occupational History  . Occupation: accounts Sport and exercise psychologist: [employer]  Tobacco Use  . Smoking status: Never Smoker  . Smokeless tobacco: Never Used   Substance and Sexual Activity  . Alcohol use: No    Alcohol/week: 0.0 standard drinks  . Drug use: No  . Sexual activity: Not on file  Other Topics Concern  . Not on file  Social History Narrative   Divorced with children   She works in Press photographer    Possible TB exposure 2008 (after nodules found)   From [place] lived there and [place]   Never owned birds      Social Determinants of Radio broadcast assistant Strain: Not on [employer] Insecurity: Not on file  Transportation Needs: Not on file  Physical Activity: Not on file  Stress: Not on file  Social Connections: Not on file  Intimate Partner Violence: Not on file    Past Surgical History:  Procedure Laterality Date  . ABDOMINAL HYSTERECTOMY    . BREAST BIOPSY Right   . CHOLECYSTECTOMY  2014  . COLONOSCOPY  05/04/2009   prolapsing external hemorrhoids  . ENDOMETRIAL ABLATION    . HEMORRHOID SURGERY     x 3  . TUBAL LIGATION      Family History  Problem Relation Age of Onset  . Heart disease Maternal Grandmother        aunts, uncles, sister  . Diabetes Mellitus II Maternal Grandmother   . Kidney failure Sister   .  Diabetes Mellitus II Sister   . Lung cancer Other        aunts and uncles  . Colon cancer Maternal Aunt        aunts and uncles  . Diabetes Mellitus II Maternal Aunt   . Colon cancer Maternal Uncle   . Diabetes Mellitus II Maternal Uncle   . Diabetes type II Mother     No Known Allergies  Current Outpatient Medications on File Prior to Visit  Medication Sig Dispense Refill  . albuterol (VENTOLIN HFA) 108 (90 Base) MCG/ACT inhaler INHALE 2 PUFFS INTO THE LUNGS EVERY 4 (FOUR) HOURS AS NEEDED FOR WHEEZING OR SHORTNESS OF BREATH. 18 g 0  . aspirin-acetaminophen-caffeine (EXCEDRIN MIGRAINE) 250-250-65 MG tablet Take 1 tablet by mouth every 6 (six) hours as needed (for migraines).    Marland Kitchen atorvastatin (LIPITOR) 20 MG tablet TAKE 1 TABLET BY MOUTH EVERY DAY 90 tablet 3  . Cholecalciferol (DIALYVITE VITAMIN D  5000) 125 MCG (5000 UT) capsule Take 1 capsule (5,000 Units total) by mouth daily. 90 capsule 3  . hydrochlorothiazide (HYDRODIURIL) 25 MG tablet Take 1 tablet (25 mg total) by mouth daily. 90 tablet 0  . levothyroxine (SYNTHROID) 100 MCG tablet Take 1 tablet (100 mcg total) by mouth daily. 90 tablet 3  . losartan (COZAAR) 100 MG tablet Please specify directions, refills and quantity 90 tablet 0  . montelukast (SINGULAIR) 10 MG tablet Take 1 tablet by mouth at bedtime. (Patient taking differently: Take 1 tablet by mouth at bedtime PRN) 90 tablet 1  . omeprazole (PRILOSEC) 20 MG capsule Take 1 capsule (20 mg total) by mouth daily. 30 capsule 3  . Polyethylene Glycol 3350 (MIRALAX PO) Take by mouth.    . potassium chloride (KLOR-CON) 10 MEQ tablet Take 1 tablet (10 mEq total) by mouth daily. 90 tablet 3  . SUMAtriptan (IMITREX) 50 MG tablet Take 1 tablet (50 mg total) by mouth every 2 (two) hours as needed for migraine. May repeat in 2 hours if headache persists or recurs. 10 tablet 2   No current facility-administered medications on file prior to visit.    BP (!) 146/90 (BP Location: Right Arm, Patient Position: Sitting, Cuff Size: Normal)   Pulse 73   Temp 98.6 F (37 C) (Oral)   Ht 5\' 7"  (1.702 m)   Wt 185 lb 4 oz (84 kg)   SpO2 96%   BMI 29.01 kg/m       Objective:   Physical Exam Vitals and nursing note reviewed.  Constitutional:      Appearance: Normal appearance.  Musculoskeletal:        General: Tenderness present.     Left foot: Normal capillary refill. Swelling and bony tenderness present. No deformity or crepitus. Normal pulse.       Feet:     Comments: Has some very mild soft tissue swelling above the cuboid bone.  Tenderness to this area as well.  Has good range of motion but pain with flexion and dorsiflexion of the left foot as well as internal and external rotation  Skin:    General: Skin is warm and dry.  Neurological:     General: No focal deficit present.      Mental Status: She is alert and oriented to person, place, and time.  Psychiatric:        Mood and Affect: Mood normal.        Behavior: Behavior normal.        Thought Content: Thought content  normal.        Judgment: Judgment normal.       Assessment & Plan:  1. Left foot pain -Likely and and/ligament strain.  We will get x-ray of the foot to rule out stress fracture.  Prescribe short course of tramadol that she can take as needed for pain relief.  Lysed rest, compression, ice, and elevation - DG Foot Complete Left; Future - traMADol (ULTRAM) 50 MG tablet; Take 1 tablet (50 mg total) by mouth every 8 (eight) hours as needed for up to 5 days.  Dispense: 15 tablet; Refill: 0   [name], NP

## 2020-07-12 ENCOUNTER — Telehealth: Payer: Self-pay | Admitting: Adult Health

## 2020-07-12 NOTE — Telephone Encounter (Signed)
Spoke to patient and informed of xray results.   IMPRESSION: No definite fracture or dislocation.  Asymmetric osteopenia and mild degenerative arthritis involving the Midfoot.  Will continue with Cervidil measures, if no better by early next week she will let me know and then can refer to sports medicine

## 2020-07-13 ENCOUNTER — Other Ambulatory Visit: Payer: Self-pay | Admitting: Adult Health

## 2020-07-13 ENCOUNTER — Encounter: Payer: Self-pay | Admitting: Adult Health

## 2020-07-13 DIAGNOSIS — M79672 Pain in left foot: Secondary | ICD-10-CM

## 2020-07-24 NOTE — Progress Notes (Signed)
Subjective:    I'm seeing this patient as a consultation for:  [name], NP. Note will be routed back to referring provider/PCP.  CC: L foot pain  I, Molly Weber, LAT, ATC, am serving as scribe for Dr. Lynne Leader.  HPI: Pt is a 55 y/o female presenting w/ L dorsal foot pain since early March 2022 w/ no specific MOI noted although she may have rolled her ankle slightly.  She was seen by her PCP on 07/11/20 and was prescribed Tramadol and advised in RICE principles.  Since then, she con't to c/o L foot pain that she locates to dorsal aspect of L foot and along lateral malleolus and proximally into lateral aspect of lower leg.  L foot swelling: no Aggravating factors: wearing high heels, stepping a certain way Treatments tried: compression, ice, naproxen  Diagnostic testing: L foot XR- 07/11/20  Past medical history, Surgical history, Family history, Social history, Allergies, and medications have been entered into the medical record, reviewed.   Review of Systems: No new headache, visual changes, nausea, vomiting, diarrhea, constipation, dizziness, abdominal pain, skin rash, fevers, chills, night sweats, weight loss, swollen lymph nodes, body aches, joint swelling, muscle aches, chest pain, shortness of breath, mood changes, visual or auditory hallucinations.   Objective:    Vitals:   07/25/20 0902  BP: (!) 145/90  Pulse: 82  SpO2: 97%   General: Well Developed, well nourished, and in no acute distress.  Neuro/Psych: Alert and oriented x3, extra-ocular muscles intact, able to move all 4 extremities, sensation grossly intact. Skin: Warm and dry, no rashes noted.  Respiratory: Not using accessory muscles, speaking in full sentences, trachea midline.  Cardiovascular: Pulses palpable, no extremity edema. Abdomen: Does not appear distended. MSK: L-spine normal-appearing nontender midline normal lumbar motion.  Minimally positive left-sided slump test.  Left knee normal-appearing  nontender.  Unable to reproduce symptoms by palpating over common fibular nerve at lateral fibular head.  Calf normal-appearing nontender.  Left ankle normal-appearing Tender palpation anterior lateral ankle. Normal ankle motion. Decreased strength to foot dorsiflexion 3/5 with some pain. Intact strength to eversion and inversion and plantarflexion. Stable ligamentous exam.  Lab and Radiology Results  Diagnostic Limited MSK Ultrasound of: Left ankle No ankle effusion.  Normal bony structures. Mild hypoechoic fluid surrounds the peroneus tertius tendon anterior lateral ankle.  Tendon is intact. Peroneal tendons are normal-appearing Impression: peroneus tertius tendinopathy  DG Foot Complete Left  Result Date: 07/12/2020 CLINICAL DATA:  Left lateral foot pain EXAM: LEFT FOOT - COMPLETE 3+ VIEW COMPARISON:  None. FINDINGS: The osseous structures are asymmetrically osteopenic involving the midfoot and hindfoot. No definite fracture or dislocation. Joint spaces are preserved though mild osteophyte formation is in keeping with at least mild midfoot degenerative arthritis. No ankle effusion. Soft tissues are unremarkable. IMPRESSION: No definite fracture or dislocation. Asymmetric osteopenia and mild degenerative arthritis involving the midfoot. Electronically Signed   By: Fidela Salisbury MD   On: 07/12/2020 00:48   I, Lynne Leader, personally (independently) visualized and performed the interpretation of the images attached in this note.  X-ray images left ankle obtained today personally and independently interpreted No fractures or significant degenerative changes. Await formal radiology review   Impression and Recommendations:    Assessment and Plan: 55 y.o. female with left anterior lateral ankle pain after an inversion injury.  Some pain located into the proximal calf.  Patient has more weakness than I would expect the foot dorsiflexion.  Most likely supination is tendinopathy of  the  peroneal tertius tendon.  It is possible that she has some L4 radiculopathy but this is less likely.  Plan for physical therapy compression ankle sleeve and Voltaren gel.  Recheck back in about a month.  Return sooner if needed.Marland Kitchen  PDMP not reviewed this encounter. Orders Placed This Encounter  Procedures  . [place] LIMITED JOINT SPACE STRUCTURES LOW LEFT(NO LINKED CHARGES)    Standing Status:   Future    Number of Occurrences:   1    Standing Expiration Date:   01/25/2021    Order Specific Question:   Reason for Exam (SYMPTOM  OR DIAGNOSIS REQUIRED)    Answer:   left ankle pain    Order Specific Question:   Preferred imaging location?    Answer:   [place]  . DG Ankle Complete Left    Standing Status:   Future    Number of Occurrences:   1    Standing Expiration Date:   07/25/2021    Order Specific Question:   Reason for Exam (SYMPTOM  OR DIAGNOSIS REQUIRED)    Answer:   eval left ankle pain    Order Specific Question:   Is patient pregnant?    Answer:   No    Order Specific Question:   Preferred imaging location?    Answer:   [facility]  . Ambulatory referral to Physical Therapy    Referral Priority:   Routine    Referral Type:   Physical Medicine    Referral Reason:   Specialty Services Required    Requested Specialty:   Physical Therapy   No orders of the defined types were placed in this encounter.   Discussed warning signs or symptoms. Please see discharge instructions. Patient expresses understanding.   The above documentation has been reviewed and is accurate and complete Lynne Leader, M.D.

## 2020-07-25 ENCOUNTER — Other Ambulatory Visit: Payer: Self-pay

## 2020-07-25 ENCOUNTER — Ambulatory Visit (INDEPENDENT_AMBULATORY_CARE_PROVIDER_SITE_OTHER): Payer: BC Managed Care – PPO

## 2020-07-25 ENCOUNTER — Ambulatory Visit: Payer: Self-pay

## 2020-07-25 ENCOUNTER — Ambulatory Visit (INDEPENDENT_AMBULATORY_CARE_PROVIDER_SITE_OTHER): Payer: BC Managed Care – PPO | Admitting: Family Medicine

## 2020-07-25 VITALS — BP 145/90 | HR 82 | Ht 67.0 in | Wt 187.4 lb

## 2020-07-25 DIAGNOSIS — M25572 Pain in left ankle and joints of left foot: Secondary | ICD-10-CM

## 2020-07-25 NOTE — Patient Instructions (Addendum)
Thank you for coming in today.  Please complete the exercises that the athletic trainer went over with you: View at [email or web address]-[email or web address] using code: 3LAFHQT  I've referred you to Physical Therapy.  Let us know if you don't hear from them in one week.  Please get an Xray today before you leave  Please use voltaren gel up to 4x daily for pain as needed.   I recommend you obtained a compression sleeve to help with your joint problems. There are many options on the market however I recommend obtaining a full ankle Body Helix compression sleeve.  You can find information (including how to appropriate measure yourself for sizing) can be found at www.Body http://www.lambert.com/.  Many of these products are health savings account (HSA) eligible.   You can use the compression sleeve at any time throughout the day but is most important to use while being active as well as for 2 hours post-activity.   It is appropriate to ice following activity with the compression sleeve in place.  Recheck in 1 month.

## 2020-07-26 NOTE — Progress Notes (Signed)
Left ankle x-ray looks normal to radiology.

## 2020-08-01 ENCOUNTER — Ambulatory Visit: Payer: BC Managed Care – PPO | Admitting: Physical Therapy

## 2020-08-01 ENCOUNTER — Other Ambulatory Visit: Payer: Self-pay | Admitting: Adult Health

## 2020-08-01 DIAGNOSIS — I10 Essential (primary) hypertension: Secondary | ICD-10-CM

## 2020-08-10 ENCOUNTER — Ambulatory Visit: Payer: BC Managed Care – PPO | Attending: Family Medicine | Admitting: Physical Therapy

## 2020-08-10 DIAGNOSIS — M6281 Muscle weakness (generalized): Secondary | ICD-10-CM | POA: Insufficient documentation

## 2020-08-10 DIAGNOSIS — M25572 Pain in left ankle and joints of left foot: Secondary | ICD-10-CM | POA: Insufficient documentation

## 2020-08-28 ENCOUNTER — Other Ambulatory Visit: Payer: Self-pay

## 2020-08-28 ENCOUNTER — Encounter: Payer: Self-pay | Admitting: Physical Therapy

## 2020-08-28 ENCOUNTER — Ambulatory Visit: Payer: BC Managed Care – PPO | Admitting: Physical Therapy

## 2020-08-28 DIAGNOSIS — M25572 Pain in left ankle and joints of left foot: Secondary | ICD-10-CM | POA: Diagnosis not present

## 2020-08-28 DIAGNOSIS — M6281 Muscle weakness (generalized): Secondary | ICD-10-CM

## 2020-08-28 NOTE — Progress Notes (Deleted)
   I, Wendy Poet, LAT, ATC, am serving as scribe for Dr. Lynne Leader.  [name] is a 55 y.o. female who presents to [place] at [facility] today for f/u of L lateral foot and ankle pain.   She was last seen by Dr. Georgina Snell on 07/25/20 and was referred to PT at [facility] in [place] of which she has completed no visits.  She was also provided a HEP and advised to use an ankle compression sleeve.  Since her last visit, pt reports   Diagnostic imaging: L ankle XR- 07/25/20, L foot XR- 07/11/20  Pertinent review of systems: ***  Relevant historical information: ***   Exam:  There were no vitals taken for this visit. General: Well Developed, well nourished, and in no acute distress.   MSK: ***    Lab and Radiology Results No results found for this or any previous visit (from the past 72 hour(s)). No results found.     Assessment and Plan: 55 y.o. female with ***   PDMP not reviewed this encounter. No orders of the defined types were placed in this encounter.  No orders of the defined types were placed in this encounter.    Discussed warning signs or symptoms. Please see discharge instructions. Patient expresses understanding.   ***

## 2020-08-28 NOTE — Therapy (Signed)
[facility] [place], [place], [address] Phone: [phone]   Fax:  [phone]  Physical Therapy Evaluation  Patient Details  Name: [name] MRN: [id number] Date of Birth: [birth year] Referring Provider (PT): Lynne Leader   Encounter Date: 08/28/2020   PT End of Session - 08/28/20 1653    Visit Number 1    Number of Visits 8    Date for PT Re-Evaluation 09/25/20    PT Start Time 0407    PT Stop Time 0451    PT Time Calculation (min) 44 min    Activity Tolerance Patient tolerated treatment well    Behavior During Therapy [facility] for tasks assessed/performed           Past Medical History:  Diagnosis Date  . Bell's palsy    with res left lag  . Essential hypertension   . External hemorrhoids   . History of pneumonia   . Hyperlipidemia   . Hyperthyroidism    hx 55 years old- oral meds  . Iron deficiency anemia   . Migraine headache   . PPD positive   . Pulmonary nodules    not seen on plain cxr, first detected 1/07 re ct 10/08 pos IPPD > 15 mm 12/09 FOB/lavage 04/20/08 neg afb smear  . PVC's (premature ventricular contractions)     Past Surgical History:  Procedure Laterality Date  . ABDOMINAL HYSTERECTOMY    . BREAST BIOPSY Right   . CHOLECYSTECTOMY  2014  . COLONOSCOPY  05/04/2009   prolapsing external hemorrhoids  . ENDOMETRIAL ABLATION    . HEMORRHOID SURGERY     x 3  . TUBAL LIGATION      There were no vitals filed for this visit.    Subjective Assessment - 08/28/20 1640    Subjective COVID-19 screen performed prior to patient entering clinic.  The patient presents to the clinic today with c/o left ankle/foot that that has been on since march of 2022.  She may have inverted her ankle once but other than that she cannot recall a specific mechanism of injury.  Her pain was quite high but she has been doing home treatments that have been helpful.  her pain is a low 2/10 today at rest.    Pertinent  History Bell's Palsy, HTN, hypothyroidism, RTC injury.    How long can you walk comfortably? Varies.    Patient Stated Goals Get out of pain.    Currently in Pain? Yes    Pain Score 2     Pain Location Ankle    Pain Orientation Right    Pain Descriptors / Indicators Aching;Throbbing;Sharp    Pain Type Acute pain    Pain Onset More than a month ago    Pain Frequency Constant    Aggravating Factors  Increased up time.    Pain Relieving Factors Rest.              [facility] PT Assessment - 08/28/20 0001      Assessment   Medical Diagnosis Left ankle pain.    Referring Provider (PT) Lynne Leader    Onset Date/Surgical Date --   March 2022.     Precautions   Precautions None      Restrictions   Weight Bearing Restrictions No      Balance Screen   Has the patient fallen in the past 6 months No    Has the patient had a decrease in activity level because of a fear  of falling?  No    Is the patient reluctant to leave their home because of a fear of falling?  No      Home Environment   Living Environment Private residence      Prior Function   Level of Independence Independent      Observation/Other Assessments-Edema    Edema --   No left foot/ankle edema.     ROM / Strength   AROM / PROM / Strength AROM;Strength      AROM   Overall AROM Comments Active left ankle dorsiflexion with kne in full extension is 5 degrees and 10 with knee flexed.      Strength   Overall Strength Comments left ankle dorsiflexion is 4-/5 and shaky with MMTing and eversion is 4/5 and also shaky.      Palpation   Palpation comment Tender to palpation over the dorsum of the patient's left ankle and radiating to her her lateral ankle.      Ambulation/Gait   Gait Comments Her gait was essentially normal today.                      Objective measurements completed on examination: See above findings.       [facility] Adult PT Treatment/Exercise - 08/28/20 0001      Modalities    Modalities Electrical Stimulation;Vasopneumatic      Electrical Stimulation   Electrical Stimulation Location Left ant/lat ankle.    Electrical Stimulation Action Pre-mod.    Electrical Stimulation Parameters 80-150 Hz x 15 minutes.    Electrical Stimulation Goals Pain      Vasopneumatic   Number Minutes Vasopneumatic  15 minutes    Vasopnuematic Location  --   RT foot/ankle.   Vasopneumatic Pressure Low                       PT Long Term Goals - 08/28/20 1703      PT LONG TERM GOAL #1   Title I with advanced HEP    Time 4    Period Weeks    Status New      PT LONG TERM GOAL #2   Title Perfor ADL's with left foot/ankle pain not >2/10.    Time 4    Period Weeks    Status New                  Plan - 08/28/20 1659    Clinical Impression Statement The patient presents to [facility] wiht c/o left ankle pain that has ben ongoing since march of 2022.  She may have inverted her ankle though her MOI is not clear.  She has some limited dorsiflexion and weakness as well.  She is tender to palpation over the dorsum of her left foot and radiation of pain laterally.  She has reduced her pain via home remedies and is yet to be pain-free.Patient will benefit from skilled physical therapy intervention to address pain and deficits.    Personal Factors and Comorbidities Comorbidity 1;Other    Comorbidities Bell's Palsy, HTN, hypothyroidism, RTC injury.    Examination-Activity Limitations Other    Examination-Participation Restrictions Other    Stability/Clinical Decision Making Evolving/Moderate complexity    Clinical Decision Making Low    Rehab Potential Excellent    PT Frequency 2x / week    PT Duration 4 weeks    PT Treatment/Interventions ADLs/Self Care Home Management;Cryotherapy;Electrical Stimulation;Ultrasound;Moist Heat;Iontophoresis 4mg /ml Dexamethasone;Functional mobility training;Therapeutic activities;Therapeutic exercise;Manual techniques;Patient/family  education;Passive range of motion;Joint Manipulations;Vasopneumatic Device    PT Next Visit Plan Left ankle strengthening, left calf stretching.  E'stim/vaso.    Consulted and Agree with Plan of Care Patient           Patient will benefit from skilled therapeutic intervention in order to improve the following deficits and impairments:  Pain,Decreased activity tolerance,Decreased strength,Decreased range of motion  Visit Diagnosis: Pain in left ankle and joints of left foot - Plan: PT plan of care cert/re-cert  Muscle weakness (generalized) - Plan: PT plan of care cert/re-cert     Problem List Patient Active Problem List   Diagnosis Date Noted  . OSA (obstructive sleep apnea) 06/13/2017  . Hypothyroid 06/26/2015  . 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 Rotator cuff tear 12/21/2014  . Abdominal pain, right upper quadrant 03/14/2014  . Leg cramps 07/15/2013  . Acquired anal stenosis 06/18/2011  . Chronic constipation 06/18/2011  . PVC's (premature ventricular contractions) 05/23/2011  . Headache, migraine, intractable 05/09/2011  . Asthma exacerbation, allergic 07/31/2010  . POSITIVE PPD 05/04/2008  . Hyperlipidemia 02/05/2008  . Essential hypertension 02/05/2008  . Pulmonary nodules 02/05/2008  . Chronic cough 02/05/2008    Cinthia Rodden, [place] MPT 08/28/2020, 5:05 PM  [facility] [address] [place], [place], [address] Phone: [phone]   Fax:  [phone]  Name: [name] MRN: [id number] Date of Birth: 04-06-1966

## 2020-08-29 ENCOUNTER — Ambulatory Visit: Payer: BC Managed Care – PPO | Admitting: Family Medicine

## 2020-09-06 ENCOUNTER — Ambulatory Visit: Payer: BC Managed Care – PPO | Attending: Family Medicine | Admitting: Physical Therapy

## 2020-09-11 NOTE — Progress Notes (Deleted)
   I, Peterson Lombard, LAT, ATC acting as a scribe for Lynne Leader, MD.  [name] is a 55 y.o. female who presents to [place] at [facility] today for f/u L foot pain ongoing since early March 2022. Pt was last seen by Dr. Georgina Snell on 07/25/20 and was advised to use ankle compression sleeve, Voltaren gel, and was referred to PT of which she's completed 1 visit (and 2 no-shows). Today, pt reports   Dx imaging: 07/25/20 L ankle XR  07/11/20 L foot XR  Pertinent review of systems: ***  Relevant historical information: ***   Exam:  There were no vitals taken for this visit. General: Well Developed, well nourished, and in no acute distress.   MSK: ***    Lab and Radiology Results No results found for this or any previous visit (from the past 72 hour(s)). No results found.     Assessment and Plan: 55 y.o. female with ***   PDMP not reviewed this encounter. No orders of the defined types were placed in this encounter.  No orders of the defined types were placed in this encounter.    Discussed warning signs or symptoms. Please see discharge instructions. Patient expresses understanding.   ***

## 2020-09-12 ENCOUNTER — Ambulatory Visit: Payer: BC Managed Care – PPO | Admitting: Family Medicine

## 2020-09-27 ENCOUNTER — Other Ambulatory Visit: Payer: Self-pay

## 2020-09-27 ENCOUNTER — Encounter: Payer: Self-pay | Admitting: Adult Health

## 2020-09-27 ENCOUNTER — Telehealth (INDEPENDENT_AMBULATORY_CARE_PROVIDER_SITE_OTHER): Payer: BC Managed Care – PPO | Admitting: Adult Health

## 2020-09-27 VITALS — Ht 67.0 in | Wt 187.0 lb

## 2020-09-27 DIAGNOSIS — U071 COVID-19: Secondary | ICD-10-CM

## 2020-09-27 MED ORDER — NIRMATRELVIR/RITONAVIR (PAXLOVID)TABLET: 3.0000 | ORAL_TABLET | Freq: Two times a day (BID) | ORAL | 0 refills | Status: AC

## 2020-09-27 MED ORDER — HYDROCODONE BIT-HOMATROP MBR 5-1.5 MG/5ML PO SOLN: 5.0000 mL | Freq: Three times a day (TID) | ORAL | 0 refills | Status: DC | PRN

## 2020-09-27 NOTE — Progress Notes (Signed)
Virtual Visit via Video Note  I connected with [name] on 09/27/20 at  8:00 AM EDT by a video enabled telemedicine application and verified that I am speaking with the correct person using two identifiers.  Location patient: home Location provider:work or home office Persons participating in the virtual visit: patient, provider  I discussed the limitations of evaluation and management by telemedicine and the availability of in person appointments. The patient expressed understanding and agreed to proceed.   HPI: 55 year old female who is being evaluated today for an acute issue.  Symptoms started Monday night with a dry cough and progressively worsened since then.  Reports that she continues to have a cough, sore throat, sinus drainage, chills, subjective fever, fatigue.  She has been using Coricidin to help with cough but this has not given her any relief.  She has been vaccinated for COVID-19 x2 but no booster.  she has not tested at home   ROS: See pertinent positives and negatives per HPI.  Past Medical History:  Diagnosis Date  . Bell's palsy    with res left lag  . Essential hypertension   . External hemorrhoids   . History of pneumonia   . Hyperlipidemia   . Hyperthyroidism    hx 55 years old- oral meds  . Iron deficiency anemia   . Migraine headache   . PPD positive   . Pulmonary nodules    not seen on plain cxr, first detected 1/07 re ct 10/08 pos IPPD > 15 mm 12/09 FOB/lavage 04/20/08 neg afb smear  . PVC's (premature ventricular contractions)     Past Surgical History:  Procedure Laterality Date  . ABDOMINAL HYSTERECTOMY    . BREAST BIOPSY Right   . CHOLECYSTECTOMY  2014  . COLONOSCOPY  05/04/2009   prolapsing external hemorrhoids  . ENDOMETRIAL ABLATION    . HEMORRHOID SURGERY     x 3  . TUBAL LIGATION      Family History  Problem Relation Age of Onset  . Heart disease Maternal Grandmother        aunts, uncles, sister  . Diabetes Mellitus II  Maternal Grandmother   . Kidney failure Sister   . Diabetes Mellitus II Sister   . Lung cancer Other        aunts and uncles  . Colon cancer Maternal Aunt        aunts and uncles  . Diabetes Mellitus II Maternal Aunt   . Colon cancer Maternal Uncle   . Diabetes Mellitus II Maternal Uncle   . Diabetes type II Mother        Current Outpatient Medications:  .  albuterol (VENTOLIN HFA) 108 (90 Base) MCG/ACT inhaler, INHALE 2 PUFFS INTO THE LUNGS EVERY 4 (FOUR) HOURS AS NEEDED FOR WHEEZING OR SHORTNESS OF BREATH., Disp: 18 g, Rfl: 0 .  aspirin-acetaminophen-caffeine (EXCEDRIN MIGRAINE) 250-250-65 MG tablet, Take 1 tablet by mouth every 6 (six) hours as needed (for migraines)., Disp: , Rfl:  .  atorvastatin (LIPITOR) 20 MG tablet, TAKE 1 TABLET BY MOUTH EVERY DAY, Disp: 90 tablet, Rfl: 3 .  Cholecalciferol (DIALYVITE VITAMIN D 5000) 125 MCG (5000 UT) capsule, Take 1 capsule (5,000 Units total) by mouth daily., Disp: 90 capsule, Rfl: 3 .  hydrochlorothiazide (HYDRODIURIL) 25 MG tablet, TAKE 1 TABLET (25 MG TOTAL) BY MOUTH DAILY., Disp: 90 tablet, Rfl: 0 .  levothyroxine (SYNTHROID) 100 MCG tablet, Take 1 tablet (100 mcg total) by mouth daily., Disp: 90 tablet, Rfl: 3 .  losartan (  COZAAR) 100 MG tablet, TAKE 1 TABLET BY MOUTH DAILY, Disp: 90 tablet, Rfl: 0 .  montelukast (SINGULAIR) 10 MG tablet, Take 1 tablet by mouth at bedtime. (Patient taking differently: Take 1 tablet by mouth at bedtime PRN), Disp: 90 tablet, Rfl: 1 .  omeprazole (PRILOSEC) 20 MG capsule, Take 1 capsule (20 mg total) by mouth daily., Disp: 30 capsule, Rfl: 3 .  Polyethylene Glycol 3350 (MIRALAX PO), Take by mouth., Disp: , Rfl:  .  potassium chloride (KLOR-CON) 10 MEQ tablet, Take 1 tablet (10 mEq total) by mouth daily., Disp: 90 tablet, Rfl: 3 .  SUMAtriptan (IMITREX) 50 MG tablet, Take 1 tablet (50 mg total) by mouth every 2 (two) hours as needed for migraine. May repeat in 2 hours if headache persists or recurs., Disp:  10 tablet, Rfl: 2  EXAM:  VITALS per patient if applicable:  GENERAL: alert, oriented, appears well and in no acute distress  HEENT: atraumatic, conjunttiva clear, no obvious abnormalities on inspection of external nose and ears  NECK: normal movements of the head and neck  LUNGS: on inspection no signs of respiratory distress, breathing rate appears normal, no obvious gross SOB, gasping or wheezing  CV: no obvious cyanosis  MS: moves all visible extremities without noticeable abnormality  PSYCH/NEURO: pleasant and cooperative, no obvious depression or anxiety, speech and thought processing grossly intact  ASSESSMENT AND PLAN:  Discussed the following assessment and plan:  1. COVID-19 virus infection - Symptoms consistent with Covid 19, will treat with Paxlovid. Advised not to take statin when taking Paxlovid  - nirmatrelvir/ritonavir EUA (PAXLOVID) TABS; Take 3 tablets by mouth 2 (two) times daily for 5 days. Patient GFR is 72 Take nirmatrelvir (150 mg) two tablets twice daily for 5 days and ritonavir (100 mg) one tablet twice daily for 5 days.  Dispense: 30 tablet; Refill: 0 - HYDROcodone bit-homatropine (HYCODAN) 5-1.5 MG/5ML syrup; Take 5 mLs by mouth every 8 (eight) hours as needed for cough.  Dispense: 120 mL; Refill: 0 - Follow up if symptoms worsening     I discussed the assessment and treatment plan with the patient. The patient was provided an opportunity to ask questions and all were answered. The patient agreed with the plan and demonstrated an understanding of the instructions.   The patient was advised to call back or seek an in-person evaluation if the symptoms worsen or if the condition fails to improve as anticipated.   [name], NP

## 2020-09-28 ENCOUNTER — Encounter: Payer: Self-pay | Admitting: Adult Health

## 2020-09-29 NOTE — Telephone Encounter (Signed)
Please advise regarding meds possibly causing emesis.

## 2020-09-29 NOTE — Telephone Encounter (Signed)
Please advise 

## 2020-10-11 ENCOUNTER — Encounter: Payer: Self-pay | Admitting: Adult Health

## 2020-10-11 ENCOUNTER — Other Ambulatory Visit: Payer: Self-pay | Admitting: Adult Health

## 2020-10-11 DIAGNOSIS — U071 COVID-19: Secondary | ICD-10-CM

## 2020-10-11 MED ORDER — HYDROCODONE BIT-HOMATROP MBR 5-1.5 MG/5ML PO SOLN: 5.0000 mL | Freq: Three times a day (TID) | ORAL | 0 refills | Status: DC | PRN

## 2020-10-11 NOTE — Telephone Encounter (Signed)
Please advise Ok to refill last filled 09/27/2020

## 2020-11-01 ENCOUNTER — Telehealth (INDEPENDENT_AMBULATORY_CARE_PROVIDER_SITE_OTHER): Payer: BC Managed Care – PPO | Admitting: Family Medicine

## 2020-11-01 ENCOUNTER — Other Ambulatory Visit: Payer: Self-pay

## 2020-11-01 ENCOUNTER — Encounter: Payer: Self-pay | Admitting: Family Medicine

## 2020-11-01 VITALS — Wt 180.0 lb

## 2020-11-01 DIAGNOSIS — E039 Hypothyroidism, unspecified: Secondary | ICD-10-CM

## 2020-11-01 DIAGNOSIS — R058 Other specified cough: Secondary | ICD-10-CM | POA: Diagnosis not present

## 2020-11-01 DIAGNOSIS — F419 Anxiety disorder, unspecified: Secondary | ICD-10-CM | POA: Diagnosis not present

## 2020-11-01 NOTE — Progress Notes (Signed)
Virtual Visit via Video Note  I connected with [name] on 11/01/20 at  4:30 PM EDT by a video enabled telemedicine application 2/2 [id number] pandemic and verified that I am speaking with the correct person using two identifiers.  Location patient: home Location provider:work or home office Persons participating in the virtual visit: patient, provider  I discussed the limitations of evaluation and management by telemedicine and the availability of in person appointments. The patient expressed understanding and agreed to proceed.   HPI: Pt had COVID last month Paxlovid made pt feel bad, nauseous, and tasted awful.  She stopped it after 3 days. Pt took rx cough syrup x 2 but still having a dry cough.  Denies wheezing so not using inahler Starts feeling sweaty and dizzy like she feels like an panic attack, but does not feel like a panic attack.  Can be working on laptop when it starts.  Concerned her thyroid may be causing symptoms.  Taking synthroid 100 mcg consistently this wk, but was only taking here and there when she remembered.  Last TSH was 2.57 on 01/25/20.  Pt was on xanax many yrs ago for h/o anxiety, but it made her feel like a zombie.  She was supposed to start an SSRI at the time, but didn't.  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   ROS: See pertinent positives and negatives per HPI.  Past Medical History:  Diagnosis Date   Bell's palsy    with res left lag   Essential hypertension    External hemorrhoids    History of pneumonia    Hyperlipidemia    Hyperthyroidism    hx 55 years old- oral meds   Iron deficiency anemia    Migraine headache    PPD positive    Pulmonary nodules    not seen on plain cxr, first detected 1/07 re ct 10/08 pos IPPD > 15 mm 12/09 FOB/lavage 04/20/08 neg afb smear   PVC's (premature ventricular contractions)     Past Surgical History:  Procedure Laterality Date   ABDOMINAL HYSTERECTOMY     BREAST BIOPSY Right     CHOLECYSTECTOMY  2014   COLONOSCOPY  05/04/2009   prolapsing external hemorrhoids   ENDOMETRIAL ABLATION     HEMORRHOID SURGERY     x 3   TUBAL LIGATION      Family History  Problem Relation Age of Onset   Heart disease Maternal Grandmother        aunts, uncles, sister   Diabetes Mellitus II Maternal Grandmother    Kidney failure Sister    Diabetes Mellitus II Sister    Lung cancer Other        aunts and uncles   Colon cancer Maternal Aunt        aunts and uncles   Diabetes Mellitus II Maternal Aunt    Colon cancer Maternal Uncle    Diabetes Mellitus II Maternal Uncle    Diabetes type II Mother      Current Outpatient Medications:    albuterol (VENTOLIN HFA) 108 (90 Base) MCG/ACT inhaler, INHALE 2 PUFFS INTO THE LUNGS EVERY 4 (FOUR) HOURS AS NEEDED FOR WHEEZING OR SHORTNESS OF BREATH., Disp: 18 g, Rfl: 0   aspirin-acetaminophen-caffeine (EXCEDRIN MIGRAINE) 250-250-65 MG tablet, Take 1 tablet by mouth every 6 (six) hours as needed (for migraines)., Disp: , Rfl:    atorvastatin (LIPITOR) 20 MG tablet, TAKE 1 TABLET BY MOUTH EVERY DAY, Disp: 90 tablet, Rfl: 3  Cholecalciferol (DIALYVITE VITAMIN D 5000) 125 MCG (5000 UT) capsule, Take 1 capsule (5,000 Units total) by mouth daily., Disp: 90 capsule, Rfl: 3   hydrochlorothiazide (HYDRODIURIL) 25 MG tablet, TAKE 1 TABLET (25 MG TOTAL) BY MOUTH DAILY., Disp: 90 tablet, Rfl: 0   levothyroxine (SYNTHROID) 100 MCG tablet, Take 1 tablet (100 mcg total) by mouth daily., Disp: 90 tablet, Rfl: 3   losartan (COZAAR) 100 MG tablet, TAKE 1 TABLET BY MOUTH DAILY, Disp: 90 tablet, Rfl: 0   montelukast (SINGULAIR) 10 MG tablet, Take 1 tablet by mouth at bedtime. (Patient taking differently: Take 1 tablet by mouth at bedtime PRN), Disp: 90 tablet, Rfl: 1   Polyethylene Glycol 3350 (MIRALAX PO), Take by mouth., Disp: , Rfl:    potassium chloride (KLOR-CON) 10 MEQ tablet, Take 1 tablet (10 mEq total) by mouth daily., Disp: 90 tablet, Rfl: 3    SUMAtriptan (IMITREX) 50 MG tablet, Take 1 tablet (50 mg total) by mouth every 2 (two) hours as needed for migraine. May repeat in 2 hours if headache persists or recurs., Disp: 10 tablet, Rfl: 2   HYDROcodone bit-homatropine (HYCODAN) 5-1.5 MG/5ML syrup, Take 5 mLs by mouth every 8 (eight) hours as needed for cough. (Patient not taking: Reported on 11/01/2020), Disp: 120 mL, Rfl: 0  EXAM:  VITALS per patient if applicable: RR between 37-10 bpm  GENERAL: alert, oriented, appears well and in no acute distress  HEENT: atraumatic, conjunctiva clear, no obvious abnormalities on inspection of external nose and ears  NECK: normal movements of the head and neck  LUNGS: intermittent dry cough.  On inspection no signs of respiratory distress, breathing rate appears normal, no obvious gross SOB, gasping or wheezing  CV: no obvious cyanosis  MS: moves all visible extremities without noticeable abnormality  PSYCH/NEURO: pleasant and cooperative, no obvious depression or anxiety, speech and thought processing grossly intact  ASSESSMENT AND PLAN:  Discussed the following assessment and plan:  Post-viral cough syndrome -s/p COVID 19 infection 09/27/20 -discussed possible duration of symptoms -advised of other possible causes of ongoing cough -continue supportive care: warm fluids, honey, rest, antihistamines, etc -for continued cough CXR  Hypothyroidism, unspecified type -advised to take synthroid 100 mcg consistently 1st thing in am prior to other meds or eating -recheck TSH in 4-6 wks -may be causing increased anxiety symtpoms  Anxiety -possibly contributing to current symptoms. -advised thyroid dysfunction may also be contributing. -discussed self care and other techniques to decrease stress.  F/u prn.  Has appt next wk with pcp, advised to keep.   I discussed the assessment and treatment plan with the patient. The patient was provided an opportunity to ask questions and all were  answered. The patient agreed with the plan and demonstrated an understanding of the instructions.   The patient was advised to call back or seek an in-person evaluation if the symptoms worsen or if the condition fails to improve as anticipated.  Billie Ruddy, MD

## 2020-11-07 ENCOUNTER — Other Ambulatory Visit: Payer: Self-pay

## 2020-11-08 ENCOUNTER — Ambulatory Visit (INDEPENDENT_AMBULATORY_CARE_PROVIDER_SITE_OTHER)
Admission: RE | Admit: 2020-11-08 | Discharge: 2020-11-08 | Disposition: A | Discharge status: Home / Self Care | Payer: BC Managed Care – PPO | Source: Ambulatory Visit | Attending: Adult Health | Admitting: Adult Health

## 2020-11-08 ENCOUNTER — Ambulatory Visit (INDEPENDENT_AMBULATORY_CARE_PROVIDER_SITE_OTHER): Payer: BC Managed Care – PPO | Admitting: Adult Health

## 2020-11-08 ENCOUNTER — Encounter: Payer: Self-pay | Admitting: Adult Health

## 2020-11-08 VITALS — BP 110/78 | HR 87 | Temp 97.3°F | Ht 67.0 in | Wt 179.0 lb

## 2020-11-08 DIAGNOSIS — R5383 Other fatigue: Secondary | ICD-10-CM | POA: Diagnosis not present

## 2020-11-08 DIAGNOSIS — E039 Hypothyroidism, unspecified: Secondary | ICD-10-CM | POA: Diagnosis not present

## 2020-11-08 DIAGNOSIS — E638 Other specified nutritional deficiencies: Secondary | ICD-10-CM | POA: Diagnosis not present

## 2020-11-08 DIAGNOSIS — R55 Syncope and collapse: Secondary | ICD-10-CM

## 2020-11-08 DIAGNOSIS — R053 Chronic cough: Secondary | ICD-10-CM

## 2020-11-08 DIAGNOSIS — R059 Cough, unspecified: Secondary | ICD-10-CM | POA: Diagnosis not present

## 2020-11-08 DIAGNOSIS — E559 Vitamin D deficiency, unspecified: Secondary | ICD-10-CM | POA: Diagnosis not present

## 2020-11-08 DIAGNOSIS — G4733 Obstructive sleep apnea (adult) (pediatric): Secondary | ICD-10-CM

## 2020-11-08 LAB — COMPREHENSIVE METABOLIC PANEL
ALT: 19 U/L (ref 0–35)
AST: 18 U/L (ref 0–37)
Albumin: 4.6 g/dL (ref 3.5–5.2)
Alkaline Phosphatase: 94 U/L (ref 39–117)
BUN: 12 mg/dL (ref 6–23)
CO2: 33 mEq/L — ABNORMAL HIGH (ref 19–32)
Calcium: 10.2 mg/dL (ref 8.4–10.5)
Chloride: 98 mEq/L (ref 96–112)
Creatinine, Ser: 1.06 mg/dL (ref 0.40–1.20)
GFR: 60.17 mL/min (ref >60.00)
Glucose, Bld: 100 mg/dL — ABNORMAL HIGH (ref 70–99)
Potassium: 3.3 mEq/L — ABNORMAL LOW (ref 3.5–5.1)
Sodium: 140 mEq/L (ref 135–145)
Total Bilirubin: 1.4 mg/dL — ABNORMAL HIGH (ref 0.2–1.2)
Total Protein: 7.4 g/dL (ref 6.0–8.3)

## 2020-11-08 LAB — CBC WITH DIFFERENTIAL/PLATELET
Basophils Absolute: 0 10*3/uL (ref 0.0–0.1)
Basophils Relative: 0.8 % (ref 0.0–3.0)
Eosinophils Absolute: 0.2 10*3/uL (ref 0.0–0.7)
Eosinophils Relative: 2.7 % (ref 0.0–5.0)
HCT: 40.5 % (ref 36.0–46.0)
Hemoglobin: 13.9 g/dL (ref 12.0–15.0)
Lymphocytes Relative: 42.9 % (ref 12.0–46.0)
Lymphs Abs: 2.7 10*3/uL (ref 0.7–4.0)
MCHC: 34.3 g/dL (ref 30.0–36.0)
MCV: 87.5 fl (ref 78.0–100.0)
Monocytes Absolute: 0.3 10*3/uL (ref 0.1–1.0)
Monocytes Relative: 4.9 % (ref 3.0–12.0)
Neutro Abs: 3 10*3/uL (ref 1.4–7.7)
Neutrophils Relative %: 48.7 % (ref 43.0–77.0)
Platelets: 258 10*3/uL (ref 150.0–400.0)
RBC: 4.63 Mil/uL (ref 3.87–5.11)
RDW: 14.2 % (ref 11.5–15.5)
WBC: 6.2 10*3/uL (ref 4.0–10.5)

## 2020-11-08 LAB — VITAMIN D 25 HYDROXY (VIT D DEFICIENCY, FRACTURES): VITD: 27.24 ng/mL — ABNORMAL LOW (ref 30.00–100.00)

## 2020-11-08 LAB — VITAMIN B12: Vitamin B-12: 279 pg/mL (ref 211–911)

## 2020-11-08 LAB — TSH: TSH: 1.78 u[IU]/mL (ref 0.35–5.50)

## 2020-11-08 MED ORDER — ATORVASTATIN CALCIUM 20 MG PO TABS: 20.0000 mg | ORAL_TABLET | Freq: Every day | ORAL | 1 refills | Status: DC

## 2020-11-08 NOTE — Progress Notes (Signed)
Subjective:    Patient ID: [name], female    DOB: [birth year], 55 y.o.   MRN: [id number]  HPI  55 year old female who  has a past medical history of Bell's palsy, Essential hypertension, External hemorrhoids, History of pneumonia, Hyperlipidemia, Hyperthyroidism, Iron deficiency anemia, Migraine headache, PPD positive, Pulmonary nodules, and PVC's (premature ventricular contractions).  She presents to the office today for follow up regarding multiple issues.   Was seen virtually on 11/01/2020 by another provider for these issues  Dry Cough -was diagnosed with COVID-19 in May 2022, he was prescribed Paxil bid but this made her feel bad, nauseous, and tasted awful so she stopped it after 3 days.  Continues to have a dry cough.  Denies wheezing, not using inhaler.  She has been seen by pulmonary in the past for chronic cough that eventually resolved.  CT of the chest in 2021 that showed stable small right upper lobe and middle lobe pulmonary nodules and benign appearing granuloma of the right lung base.  These areas have been stable since 2018.  There was also noted some paraseptal emphysema at the lung apex right greater than left.  Pre syncope -feels as though she may "pass out".  This can happen anytime, she will become sweaty and dizzy and feels as though she is going to pass out.  When this happens she will sit down or lay down and the symptoms usually pass pretty quickly, likely within 2 minutes.  Does not necessarily feel as though she is having a panic attack.  No depression.  Denies palpitations, tachycardia, or shortness of breath during episodes.  Has started to happen more often, up to 3 times a day.  The only other symptom she will experience is loss of vision momentarily or her field of vision goes black.   Hypothyroidism -concerned that her thyroid may be causing some of her symptoms. Prescribed Synthroid 100 mcg in the past, but is only taking it here and there when she  remembered ( maybe 2-3 times a week)  Last TSH was 2.57 on 01/25/2020.  Fatigue -reports ongoing extreme fatigue.  She was worked up for this in June 2021 at which time her ANA was positive with a 1:40 ratio.  Iron levels were normal, her TSH at this time was 8.63, vitamin D was 10.90 and she was started on 50,000 units vitamin D weekly ( repeat in September was 105) and was then witched to 5000 unit daily - no longer taking this   Has history of mild obstructive sleep apnea.  She declined CPAP and pulmonary recommended she look into an oral appliance. She has failed to do this  Weight loss - would like to be referred to a dietician to help with her diet.   Review of Systems See HPI   Past Medical History:  Diagnosis Date   Bell's palsy    with res left lag   Essential hypertension    External hemorrhoids    History of pneumonia    Hyperlipidemia    Hyperthyroidism    hx 55 years old- oral meds   Iron deficiency anemia    Migraine headache    PPD positive    Pulmonary nodules    not seen on plain cxr, first detected 1/07 re ct 10/08 pos IPPD > 15 mm 12/09 FOB/lavage 04/20/08 neg afb smear   PVC's (premature ventricular contractions)     Social History   Socioeconomic History   Marital status:  Divorced    Spouse name: Not on file   Number of children: 2   Years of education: Not on file   Highest education level: Not on file  Occupational History   Occupation: accounts billing    Employer: [employer]  Tobacco Use   Smoking status: Never   Smokeless tobacco: Never  Substance and Sexual Activity   Alcohol use: No    Alcohol/week: 0.0 standard drinks   Drug use: No   Sexual activity: Not on file  Other Topics Concern   Not on file  Social History Narrative   Divorced with children   She works in Press photographer    Possible TB exposure 2008 (after nodules found)   From [place] lived there and [place]   Never owned birds      Social Determinants of Radio broadcast assistant  Strain: Not on file  Food Insecurity: Not on file  Transportation Needs: Not on file  Physical Activity: Not on file  Stress: Not on file  Social Connections: Not on file  Intimate Partner Violence: Not on file    Past Surgical History:  Procedure Laterality Date   ABDOMINAL HYSTERECTOMY     BREAST BIOPSY Right    CHOLECYSTECTOMY  2014   COLONOSCOPY  05/04/2009   prolapsing external hemorrhoids   ENDOMETRIAL ABLATION     HEMORRHOID SURGERY     x 3   TUBAL LIGATION      Family History  Problem Relation Age of Onset   Heart disease Maternal Grandmother        aunts, uncles, sister   Diabetes Mellitus II Maternal Grandmother    Kidney failure Sister    Diabetes Mellitus II Sister    Lung cancer Other        aunts and uncles   Colon cancer Maternal Aunt        aunts and uncles   Diabetes Mellitus II Maternal Aunt    Colon cancer Maternal Uncle    Diabetes Mellitus II Maternal Uncle    Diabetes type II Mother     No Known Allergies  Current Outpatient Medications on File Prior to Visit  Medication Sig Dispense Refill   albuterol (VENTOLIN HFA) 108 (90 Base) MCG/ACT inhaler INHALE 2 PUFFS INTO THE LUNGS EVERY 4 (FOUR) HOURS AS NEEDED FOR WHEEZING OR SHORTNESS OF BREATH. 18 g 0   aspirin-acetaminophen-caffeine (EXCEDRIN MIGRAINE) 250-250-65 MG tablet Take 1 tablet by mouth every 6 (six) hours as needed (for migraines).     Cholecalciferol (DIALYVITE VITAMIN D 5000) 125 MCG (5000 UT) capsule Take 1 capsule (5,000 Units total) by mouth daily. 90 capsule 3   hydrochlorothiazide (HYDRODIURIL) 25 MG tablet TAKE 1 TABLET (25 MG TOTAL) BY MOUTH DAILY. 90 tablet 0   HYDROcodone bit-homatropine (HYCODAN) 5-1.5 MG/5ML syrup Take 5 mLs by mouth every 8 (eight) hours as needed for cough. 120 mL 0   levothyroxine (SYNTHROID) 100 MCG tablet Take 1 tablet (100 mcg total) by mouth daily. 90 tablet 3   losartan (COZAAR) 100 MG tablet TAKE 1 TABLET BY MOUTH DAILY 90 tablet 0   montelukast  (SINGULAIR) 10 MG tablet Take 1 tablet by mouth at bedtime. (Patient taking differently: Take 1 tablet by mouth at bedtime PRN) 90 tablet 1   Polyethylene Glycol 3350 (MIRALAX PO) Take by mouth.     potassium chloride (KLOR-CON) 10 MEQ tablet Take 1 tablet (10 mEq total) by mouth daily. 90 tablet 3   SUMAtriptan (IMITREX) 50 MG tablet  Take 1 tablet (50 mg total) by mouth every 2 (two) hours as needed for migraine. May repeat in 2 hours if headache persists or recurs. 10 tablet 2   No current facility-administered medications on file prior to visit.    BP 110/78   Pulse 87   Temp (!) 97.3 F (36.3 C) (Oral)   Ht 5\' 7"  (1.702 m)   Wt 179 lb (81.2 kg)   SpO2 98%   BMI 28.04 kg/m       Objective:   Physical Exam Vitals and nursing note reviewed.  Constitutional:      Appearance: Normal appearance.  Eyes:     Extraocular Movements: Extraocular movements intact.     Conjunctiva/sclera: Conjunctivae normal.     Pupils: Pupils are equal, round, and reactive to light.  Cardiovascular:     Rate and Rhythm: Normal rate and regular rhythm.     Pulses: Normal pulses.     Heart sounds: Normal heart sounds.  Pulmonary:     Effort: Pulmonary effort is normal.     Breath sounds: Normal breath sounds.  Musculoskeletal:        General: Normal range of motion.  Skin:    General: Skin is warm and dry.  Neurological:     General: No focal deficit present.     Mental Status: She is alert and oriented to person, place, and time.  Psychiatric:        Mood and Affect: Mood normal.        Behavior: Behavior normal.        Thought Content: Thought content normal.        Judgment: Judgment normal.      Assessment & Plan:  1. Chronic cough - Consider inhaler.  - DG Chest 2 View; Future  2. Hypothyroidism, unspecified type - Needs to take medication daily  - TSH; Future - Vitamin B12; Future - Vitamin D, 25-hydroxy; Future - atorvastatin (LIPITOR) 20 MG tablet; Take 1 tablet (20 mg  total) by mouth daily.  Dispense: 90 tablet; Refill: 1 - CBC with Differential/Platelet - Vitamin D, 25-hydroxy - TSH - Vitamin B12 - CMP  3. Vitamin D deficiency  - Vitamin D, 25-hydroxy; Future - Vitamin D, 25-hydroxy  4. Imbalanced nutrition  - Amb ref to Medical Nutrition Therapy-MNT  5. Pre-syncope - Consider MRI of brain - DG Chest 2 View; Future - TSH; Future - Vitamin B12; Future - Vitamin D, 25-hydroxy; Future  6. OSA (obstructive sleep apnea) - Encouraged to follow up with her dentist for an oral appliance    7. Other fatigue - likely due to OSA vs hypothyroidism  - DG Chest 2 View; Future - TSH; Future - Vitamin B12; Future - Vitamin D, 25-hydroxy; Future - CBC with Differential/Platelet   [name], NP

## 2020-11-09 ENCOUNTER — Telehealth: Payer: Self-pay | Admitting: Adult Health

## 2020-11-09 ENCOUNTER — Other Ambulatory Visit: Payer: Self-pay | Admitting: Adult Health

## 2020-11-09 DIAGNOSIS — R55 Syncope and collapse: Secondary | ICD-10-CM

## 2020-11-09 DIAGNOSIS — H547 Unspecified visual loss: Secondary | ICD-10-CM

## 2020-11-09 DIAGNOSIS — I10 Essential (primary) hypertension: Secondary | ICD-10-CM

## 2020-11-09 MED ORDER — SPACER/AERO-HOLDING CHAMBERS DEVI: 0 refills | Status: DC

## 2020-11-09 MED ORDER — ALBUTEROL SULFATE HFA 108 (90 BASE) MCG/ACT IN AERS: 2.0000 | INHALATION_SPRAY | Freq: Four times a day (QID) | RESPIRATORY_TRACT | 0 refills | Status: DC | PRN

## 2020-11-09 NOTE — Telephone Encounter (Signed)
Updated patient on her labs.   Will have her restart K and Vitamin D   Needs to follow up for oral appliance for OSA as I think most of her fatigue is coming from   Will order MRI of brain d/t pre syncope and vision loss symptoms

## 2020-11-14 ENCOUNTER — Other Ambulatory Visit: Payer: Self-pay

## 2020-11-15 ENCOUNTER — Emergency Department (HOSPITAL_COMMUNITY): Payer: BC Managed Care – PPO

## 2020-11-15 ENCOUNTER — Inpatient Hospital Stay (HOSPITAL_COMMUNITY): Payer: BC Managed Care – PPO

## 2020-11-15 ENCOUNTER — Other Ambulatory Visit: Payer: Self-pay

## 2020-11-15 ENCOUNTER — Inpatient Hospital Stay (HOSPITAL_COMMUNITY)
Admission: EM | Admit: 2020-11-15 | Discharge: 2020-11-18 | DRG: 025 | Disposition: A | Discharge status: Home / Self Care | Payer: BC Managed Care – PPO | Attending: Internal Medicine | Admitting: Internal Medicine

## 2020-11-15 ENCOUNTER — Encounter (HOSPITAL_COMMUNITY): Payer: Self-pay

## 2020-11-15 ENCOUNTER — Ambulatory Visit: Payer: BC Managed Care – PPO | Admitting: Adult Health

## 2020-11-15 DIAGNOSIS — E559 Vitamin D deficiency, unspecified: Secondary | ICD-10-CM | POA: Diagnosis present

## 2020-11-15 DIAGNOSIS — R7401 Elevation of levels of liver transaminase levels: Secondary | ICD-10-CM | POA: Diagnosis not present

## 2020-11-15 DIAGNOSIS — U071 COVID-19: Secondary | ICD-10-CM | POA: Diagnosis present

## 2020-11-15 DIAGNOSIS — K869 Disease of pancreas, unspecified: Secondary | ICD-10-CM | POA: Diagnosis present

## 2020-11-15 DIAGNOSIS — I10 Essential (primary) hypertension: Secondary | ICD-10-CM | POA: Diagnosis not present

## 2020-11-15 DIAGNOSIS — G510 Bell's palsy: Secondary | ICD-10-CM | POA: Diagnosis present

## 2020-11-15 DIAGNOSIS — Z801 Family history of malignant neoplasm of trachea, bronchus and lung: Secondary | ICD-10-CM

## 2020-11-15 DIAGNOSIS — Z20822 Contact with and (suspected) exposure to covid-19: Secondary | ICD-10-CM | POA: Diagnosis present

## 2020-11-15 DIAGNOSIS — N6489 Other specified disorders of breast: Secondary | ICD-10-CM | POA: Diagnosis not present

## 2020-11-15 DIAGNOSIS — Z833 Family history of diabetes mellitus: Secondary | ICD-10-CM | POA: Diagnosis not present

## 2020-11-15 DIAGNOSIS — R32 Unspecified urinary incontinence: Secondary | ICD-10-CM | POA: Diagnosis not present

## 2020-11-15 DIAGNOSIS — E876 Hypokalemia: Secondary | ICD-10-CM | POA: Diagnosis not present

## 2020-11-15 DIAGNOSIS — Z79890 Hormone replacement therapy (postmenopausal): Secondary | ICD-10-CM | POA: Diagnosis not present

## 2020-11-15 DIAGNOSIS — E039 Hypothyroidism, unspecified: Secondary | ICD-10-CM | POA: Diagnosis present

## 2020-11-15 DIAGNOSIS — Z8249 Family history of ischemic heart disease and other diseases of the circulatory system: Secondary | ICD-10-CM | POA: Diagnosis not present

## 2020-11-15 DIAGNOSIS — C719 Malignant neoplasm of brain, unspecified: Secondary | ICD-10-CM | POA: Diagnosis not present

## 2020-11-15 DIAGNOSIS — C712 Malignant neoplasm of temporal lobe: Secondary | ICD-10-CM

## 2020-11-15 DIAGNOSIS — J9811 Atelectasis: Secondary | ICD-10-CM | POA: Diagnosis not present

## 2020-11-15 DIAGNOSIS — J439 Emphysema, unspecified: Secondary | ICD-10-CM | POA: Diagnosis not present

## 2020-11-15 DIAGNOSIS — E785 Hyperlipidemia, unspecified: Secondary | ICD-10-CM | POA: Diagnosis not present

## 2020-11-15 DIAGNOSIS — T502X5A Adverse effect of carbonic-anhydrase inhibitors, benzothiadiazides and other diuretics, initial encounter: Secondary | ICD-10-CM | POA: Diagnosis not present

## 2020-11-15 DIAGNOSIS — K760 Fatty (change of) liver, not elsewhere classified: Secondary | ICD-10-CM | POA: Diagnosis not present

## 2020-11-15 DIAGNOSIS — Z841 Family history of disorders of kidney and ureter: Secondary | ICD-10-CM

## 2020-11-15 DIAGNOSIS — G936 Cerebral edema: Secondary | ICD-10-CM | POA: Diagnosis present

## 2020-11-15 DIAGNOSIS — R55 Syncope and collapse: Secondary | ICD-10-CM | POA: Diagnosis not present

## 2020-11-15 DIAGNOSIS — E041 Nontoxic single thyroid nodule: Secondary | ICD-10-CM | POA: Diagnosis not present

## 2020-11-15 DIAGNOSIS — E042 Nontoxic multinodular goiter: Secondary | ICD-10-CM | POA: Diagnosis present

## 2020-11-15 DIAGNOSIS — K3189 Other diseases of stomach and duodenum: Secondary | ICD-10-CM | POA: Diagnosis not present

## 2020-11-15 DIAGNOSIS — Z800 Family history of malignant neoplasm of digestive organs: Secondary | ICD-10-CM

## 2020-11-15 DIAGNOSIS — I700 Atherosclerosis of aorta: Secondary | ICD-10-CM | POA: Diagnosis not present

## 2020-11-15 DIAGNOSIS — Z79899 Other long term (current) drug therapy: Secondary | ICD-10-CM | POA: Diagnosis not present

## 2020-11-15 DIAGNOSIS — G4733 Obstructive sleep apnea (adult) (pediatric): Secondary | ICD-10-CM | POA: Diagnosis not present

## 2020-11-15 DIAGNOSIS — C801 Malignant (primary) neoplasm, unspecified: Secondary | ICD-10-CM | POA: Diagnosis not present

## 2020-11-15 DIAGNOSIS — N649 Disorder of breast, unspecified: Secondary | ICD-10-CM | POA: Diagnosis not present

## 2020-11-15 DIAGNOSIS — Z9049 Acquired absence of other specified parts of digestive tract: Secondary | ICD-10-CM | POA: Diagnosis not present

## 2020-11-15 DIAGNOSIS — R933 Abnormal findings on diagnostic imaging of other parts of digestive tract: Secondary | ICD-10-CM | POA: Diagnosis not present

## 2020-11-15 DIAGNOSIS — R569 Unspecified convulsions: Secondary | ICD-10-CM

## 2020-11-15 DIAGNOSIS — R29810 Facial weakness: Secondary | ICD-10-CM | POA: Diagnosis not present

## 2020-11-15 DIAGNOSIS — Z8616 Personal history of COVID-19: Secondary | ICD-10-CM

## 2020-11-15 DIAGNOSIS — G9389 Other specified disorders of brain: Secondary | ICD-10-CM | POA: Diagnosis not present

## 2020-11-15 DIAGNOSIS — D496 Neoplasm of unspecified behavior of brain: Secondary | ICD-10-CM | POA: Diagnosis not present

## 2020-11-15 DIAGNOSIS — J8410 Pulmonary fibrosis, unspecified: Secondary | ICD-10-CM | POA: Diagnosis not present

## 2020-11-15 HISTORY — DX: Unspecified convulsions: R56.9

## 2020-11-15 LAB — POTASSIUM: Potassium: 3.1 mmol/L — ABNORMAL LOW (ref 3.5–5.1)

## 2020-11-15 LAB — CBC
HCT: 42 % (ref 36.0–46.0)
Hemoglobin: 14 g/dL (ref 12.0–15.0)
MCH: 29.9 pg (ref 26.0–34.0)
MCHC: 33.3 g/dL (ref 30.0–36.0)
MCV: 89.6 fL (ref 80.0–100.0)
Platelets: 255 10*3/uL (ref 150–400)
RBC: 4.69 MIL/uL (ref 3.87–5.11)
RDW: 13.2 % (ref 11.5–15.5)
WBC: 10.6 10*3/uL — ABNORMAL HIGH (ref 4.0–10.5)
nRBC: 0 % (ref 0.0–0.2)

## 2020-11-15 LAB — URINALYSIS, ROUTINE W REFLEX MICROSCOPIC
Bacteria, UA: NONE SEEN
Bilirubin Urine: NEGATIVE
Glucose, UA: NEGATIVE mg/dL
Ketones, ur: NEGATIVE mg/dL
Leukocytes,Ua: NEGATIVE
Nitrite: NEGATIVE
Protein, ur: NEGATIVE mg/dL
Specific Gravity, Urine: 1.018 (ref 1.005–1.030)
pH: 5 (ref 5.0–8.0)

## 2020-11-15 LAB — HEPATIC FUNCTION PANEL
ALT: 25 U/L (ref 0–44)
AST: 52 U/L — ABNORMAL HIGH (ref 15–41)
Albumin: 4.2 g/dL (ref 3.5–5.0)
Alkaline Phosphatase: 87 U/L (ref 38–126)
Bilirubin, Direct: 0.2 mg/dL (ref 0.0–0.2)
Indirect Bilirubin: 1.5 mg/dL — ABNORMAL HIGH (ref 0.3–0.9)
Total Bilirubin: 1.7 mg/dL — ABNORMAL HIGH (ref 0.3–1.2)
Total Protein: 7.4 g/dL (ref 6.5–8.1)

## 2020-11-15 LAB — RESP PANEL BY RT-PCR (FLU A&B, COVID) ARPGX2
Influenza A by PCR: NEGATIVE
Influenza B by PCR: NEGATIVE
SARS Coronavirus 2 by RT PCR: NEGATIVE

## 2020-11-15 LAB — BASIC METABOLIC PANEL
Anion gap: 13 (ref 5–15)
BUN: 9 mg/dL (ref 6–20)
CO2: 24 mmol/L (ref 22–32)
Calcium: 9.6 mg/dL (ref 8.9–10.3)
Chloride: 101 mmol/L (ref 98–111)
Creatinine, Ser: 1.11 mg/dL — ABNORMAL HIGH (ref 0.44–1.00)
GFR, Estimated: 59 mL/min — ABNORMAL LOW (ref >60)
Glucose, Bld: 117 mg/dL — ABNORMAL HIGH (ref 70–99)
Potassium: 2.7 mmol/L — CL (ref 3.5–5.1)
Sodium: 138 mmol/L (ref 135–145)

## 2020-11-15 LAB — HIV ANTIBODY (ROUTINE TESTING W REFLEX): HIV Screen 4th Generation wRfx: NONREACTIVE

## 2020-11-15 LAB — TROPONIN I (HIGH SENSITIVITY)
Troponin I (High Sensitivity): 3 ng/L (ref <18)
Troponin I (High Sensitivity): 4 ng/L (ref <18)

## 2020-11-15 LAB — D-DIMER, QUANTITATIVE: D-Dimer, Quant: 0.53 ug/mL-FEU — ABNORMAL HIGH (ref 0.00–0.50)

## 2020-11-15 LAB — CBG MONITORING, ED: Glucose-Capillary: 121 mg/dL — ABNORMAL HIGH (ref 70–99)

## 2020-11-15 LAB — MAGNESIUM: Magnesium: 2.3 mg/dL (ref 1.7–2.4)

## 2020-11-15 LAB — PHOSPHORUS: Phosphorus: 3.2 mg/dL (ref 2.5–4.6)

## 2020-11-15 MED ORDER — PROCHLORPERAZINE EDISYLATE 10 MG/2ML IJ SOLN
10.0000 mg | Freq: Once | INTRAMUSCULAR | Status: AC
  Administered 2020-11-15: 10 mg via INTRAVENOUS
  Filled 2020-11-15: qty 2

## 2020-11-15 MED ORDER — LORAZEPAM 2 MG/ML IJ SOLN: 4.0000 mg | INTRAMUSCULAR | Status: AC

## 2020-11-15 MED ORDER — SODIUM CHLORIDE 0.9 % IV BOLUS
1000.0000 mL | Freq: Once | INTRAVENOUS | Status: AC
  Administered 2020-11-15: 1000 mL via INTRAVENOUS

## 2020-11-15 MED ORDER — ATORVASTATIN CALCIUM 10 MG PO TABS
20.0000 mg | ORAL_TABLET | Freq: Every day | ORAL | Status: DC
  Administered 2020-11-17 – 2020-11-18 (×2): 20 mg via ORAL
  Filled 2020-11-15 (×2): qty 2

## 2020-11-15 MED ORDER — ONDANSETRON HCL 4 MG/2ML IJ SOLN: 4.0000 mg | Freq: Four times a day (QID) | INTRAMUSCULAR | Status: DC | PRN

## 2020-11-15 MED ORDER — LOSARTAN POTASSIUM 50 MG PO TABS
100.0000 mg | ORAL_TABLET | Freq: Every day | ORAL | Status: DC
  Administered 2020-11-17 – 2020-11-18 (×2): 100 mg via ORAL
  Filled 2020-11-15 (×2): qty 2

## 2020-11-15 MED ORDER — ACETAMINOPHEN 325 MG PO TABS: 650.0000 mg | ORAL_TABLET | Freq: Four times a day (QID) | ORAL | Status: DC | PRN

## 2020-11-15 MED ORDER — GADOBUTROL 1 MMOL/ML IV SOLN
8.0000 mL | Freq: Once | INTRAVENOUS | Status: AC | PRN
  Administered 2020-11-15: 8 mL via INTRAVENOUS

## 2020-11-15 MED ORDER — HYDROCHLOROTHIAZIDE 25 MG PO TABS
25.0000 mg | ORAL_TABLET | Freq: Every day | ORAL | Status: DC
  Administered 2020-11-17 – 2020-11-18 (×2): 25 mg via ORAL
  Filled 2020-11-15 (×2): qty 1

## 2020-11-15 MED ORDER — POTASSIUM CHLORIDE 10 MEQ/100ML IV SOLN
10.0000 meq | INTRAVENOUS | Status: AC
  Administered 2020-11-15 (×2): 10 meq via INTRAVENOUS
  Filled 2020-11-15 (×2): qty 100

## 2020-11-15 MED ORDER — LEVOTHYROXINE SODIUM 100 MCG PO TABS
100.0000 ug | ORAL_TABLET | Freq: Every day | ORAL | Status: DC
  Administered 2020-11-16 – 2020-11-18 (×3): 100 ug via ORAL
  Filled 2020-11-15 (×3): qty 1

## 2020-11-15 MED ORDER — POTASSIUM CHLORIDE CRYS ER 20 MEQ PO TBCR
40.0000 meq | EXTENDED_RELEASE_TABLET | Freq: Two times a day (BID) | ORAL | Status: DC
  Administered 2020-11-15: 40 meq via ORAL
  Filled 2020-11-15: qty 2

## 2020-11-15 MED ORDER — TETRACAINE HCL 0.5 % OP SOLN
1.0000 [drp] | Freq: Once | OPHTHALMIC | Status: AC
  Administered 2020-11-15: 1 [drp] via OPHTHALMIC

## 2020-11-15 MED ORDER — FLUORESCEIN SODIUM 1 MG OP STRP
1.0000 | ORAL_STRIP | Freq: Once | OPHTHALMIC | Status: AC
  Administered 2020-11-15: 1 via OPHTHALMIC
  Filled 2020-11-15: qty 1

## 2020-11-15 MED ORDER — DEXAMETHASONE SODIUM PHOSPHATE 10 MG/ML IJ SOLN
10.0000 mg | Freq: Four times a day (QID) | INTRAMUSCULAR | Status: DC
  Administered 2020-11-15 – 2020-11-18 (×10): 10 mg via INTRAVENOUS
  Filled 2020-11-15 (×11): qty 1

## 2020-11-15 MED ORDER — LEVETIRACETAM IN NACL 500 MG/100ML IV SOLN
500.0000 mg | Freq: Two times a day (BID) | INTRAVENOUS | Status: DC
Start: 2020-11-15 — End: 2020-11-18
  Administered 2020-11-15 – 2020-11-18 (×6): 500 mg via INTRAVENOUS
  Filled 2020-11-15 (×6): qty 100

## 2020-11-15 MED ORDER — LORAZEPAM 0.5 MG PO TABS: 0.5000 mg | ORAL_TABLET | ORAL | Status: DC | PRN

## 2020-11-15 MED ORDER — IOHEXOL 9 MG/ML PO SOLN
ORAL | Status: AC
  Filled 2020-11-15: qty 1000

## 2020-11-15 MED ORDER — DIPHENHYDRAMINE HCL 50 MG/ML IJ SOLN
12.5000 mg | Freq: Once | INTRAMUSCULAR | Status: AC
  Administered 2020-11-15: 12.5 mg via INTRAVENOUS
  Filled 2020-11-15: qty 1

## 2020-11-15 MED ORDER — ALBUTEROL SULFATE HFA 108 (90 BASE) MCG/ACT IN AERS
2.0000 | INHALATION_SPRAY | Freq: Four times a day (QID) | RESPIRATORY_TRACT | Status: DC | PRN
  Filled 2020-11-15: qty 6.7

## 2020-11-15 MED ORDER — SODIUM CHLORIDE 0.9 % IV SOLN
75.0000 mL/h | INTRAVENOUS | Status: DC
  Administered 2020-11-15 – 2020-11-18 (×3): 75 mL/h via INTRAVENOUS

## 2020-11-15 MED ORDER — POTASSIUM CHLORIDE ER 10 MEQ PO TBCR: 10.0000 meq | EXTENDED_RELEASE_TABLET | Freq: Every day | ORAL | Status: DC

## 2020-11-15 MED ORDER — POTASSIUM CHLORIDE CRYS ER 20 MEQ PO TBCR
40.0000 meq | EXTENDED_RELEASE_TABLET | Freq: Two times a day (BID) | ORAL | Status: AC
  Administered 2020-11-15: 40 meq via ORAL
  Filled 2020-11-15: qty 2

## 2020-11-15 MED ORDER — ONDANSETRON HCL 4 MG PO TABS: 4.0000 mg | ORAL_TABLET | Freq: Four times a day (QID) | ORAL | Status: DC | PRN

## 2020-11-15 MED ORDER — IOHEXOL 300 MG/ML  SOLN
100.0000 mL | Freq: Once | INTRAMUSCULAR | Status: AC | PRN
  Administered 2020-11-15: 100 mL via INTRAVENOUS

## 2020-11-15 NOTE — ED Notes (Signed)
Rt facial droop but has had this since she had bells palsy

## 2020-11-15 NOTE — ED Notes (Signed)
Floor nurse called me back but I was getting a code stroke and could not give the report then.  I called back the floor  That nurse was going to call me back

## 2020-11-15 NOTE — Consult Note (Signed)
Reason for Consult: Left temporal mass Referring Physician: ER  [name] is an 55 y.o. female.  HPI: 55 year old female who went to bed last night and awoke feeling out of it with a headache and had soiled her self and was incontinent of bladder.  Patient was brought to the emergency department was evaluated head CT showed possible vasogenic edema left temporal lobe and neurology medicine and we have been consulted.  Patient denies any significant past medical history did have COVID 2 months ago.  Apparently does have a history of pulmonary nodules that have been followed serially  Past Medical History:  Diagnosis Date   Bell's palsy    with res left lag   Essential hypertension    External hemorrhoids    History of pneumonia    Hyperlipidemia    Hyperthyroidism    hx 55 years old- oral meds   Iron deficiency anemia    Migraine headache    PPD positive    Pulmonary nodules    not seen on plain cxr, first detected 1/07 re ct 10/08 pos IPPD > 15 mm 12/09 FOB/lavage 04/20/08 neg afb smear   PVC's (premature ventricular contractions)     Past Surgical History:  Procedure Laterality Date   ABDOMINAL HYSTERECTOMY     BREAST BIOPSY Right    CHOLECYSTECTOMY  2014   COLONOSCOPY  05/04/2009   prolapsing external hemorrhoids   ENDOMETRIAL ABLATION     HEMORRHOID SURGERY     x 3   TUBAL LIGATION      Family History  Problem Relation Age of Onset   Heart disease Maternal Grandmother        aunts, uncles, sister   Diabetes Mellitus II Maternal Grandmother    Kidney failure Sister    Diabetes Mellitus II Sister    Lung cancer Other        aunts and uncles   Colon cancer Maternal Aunt        aunts and uncles   Diabetes Mellitus II Maternal Aunt    Colon cancer Maternal Uncle    Diabetes Mellitus II Maternal Uncle    Diabetes type II Mother     Social History:  reports that she has never smoked. She has never used smokeless tobacco. She reports that she does not drink  alcohol and does not use drugs.  Allergies: No Known Allergies  Medications: I have reviewed the patient's current medications.  Results for orders placed or performed during the hospital encounter of 11/15/20 (from the past 48 hour(s))  Basic metabolic panel     Status: Abnormal   Collection Time: 11/15/20  9:38 AM  Result Value Ref Range   Sodium 138 135 - 145 mmol/L   Potassium 2.7 (LL) 3.5 - 5.1 mmol/L    Comment: CRITICAL RESULT CALLED TO, READ BACK BY AND VERIFIED WITH: T.SHROPSHIRE,RN 1130 11/15/20 CLARK,S    Chloride 101 98 - 111 mmol/L   CO2 24 22 - 32 mmol/L   Glucose, Bld 117 (H) 70 - 99 mg/dL    Comment: Glucose reference range applies only to samples taken after fasting for at least 8 hours.   BUN 9 6 - 20 mg/dL   Creatinine, Ser 1.11 (H) 0.44 - 1.00 mg/dL   Calcium 9.6 8.9 - 10.3 mg/dL   GFR, Estimated 59 (L) >60 mL/min    Comment: (NOTE) Calculated using the CKD-EPI Creatinine Equation (2021)    Anion gap 13 5 - 15    Comment: Performed  at [facility], [place] [address] [place], [place] [address]  CBC     Status: Abnormal   Collection Time: 11/15/20  9:38 AM  Result Value Ref Range   WBC 10.6 (H) 4.0 - 10.5 K/uL   RBC 4.69 3.87 - 5.11 MIL/uL   Hemoglobin 14.0 12.0 - 15.0 g/dL   HCT 42.0 36.0 - 46.0 %   MCV 89.6 80.0 - 100.0 fL   MCH 29.9 26.0 - 34.0 pg   MCHC 33.3 30.0 - 36.0 g/dL   RDW 13.2 11.5 - 15.5 %   Platelets 255 150 - 400 K/uL   nRBC 0.0 0.0 - 0.2 %    Comment: Performed at [facility], [place] [address] [place], [place] [address]  Urinalysis, Routine w reflex microscopic Urine, Clean Catch     Status: Abnormal   Collection Time: 11/15/20  9:38 AM  Result Value Ref Range   Color, Urine YELLOW YELLOW   APPearance CLOUDY (A) CLEAR   Specific Gravity, Urine 1.018 1.005 - 1.030   pH 5.0 5.0 - 8.0   Glucose, UA NEGATIVE NEGATIVE mg/dL   Hgb urine dipstick MODERATE (A) NEGATIVE   Bilirubin Urine NEGATIVE NEGATIVE   Ketones, ur NEGATIVE  NEGATIVE mg/dL   Protein, ur NEGATIVE NEGATIVE mg/dL   Nitrite NEGATIVE NEGATIVE   Leukocytes,Ua NEGATIVE NEGATIVE   RBC / HPF 0-5 0 - 5 RBC/hpf   WBC, UA 0-5 0 - 5 WBC/hpf   Bacteria, UA NONE SEEN NONE SEEN   Squamous Epithelial / LPF 0-5 0 - 5   Mucus PRESENT     Comment: Performed at [facility], [place] [address], [place], [place] [address]  CBG monitoring, ED     Status: Abnormal   Collection Time: 11/15/20  9:42 AM  Result Value Ref Range   Glucose-Capillary 121 (H) 70 - 99 mg/dL    Comment: Glucose reference range applies only to samples taken after fasting for at least 8 hours.  Hepatic function panel     Status: Abnormal   Collection Time: 11/15/20 12:05 PM  Result Value Ref Range   Total Protein 7.4 6.5 - 8.1 g/dL   Albumin 4.2 3.5 - 5.0 g/dL   AST 52 (H) 15 - 41 U/L   ALT 25 0 - 44 U/L   Alkaline Phosphatase 87 38 - 126 U/L   Total Bilirubin 1.7 (H) 0.3 - 1.2 mg/dL   Bilirubin, Direct 0.2 0.0 - 0.2 mg/dL   Indirect Bilirubin 1.5 (H) 0.3 - 0.9 mg/dL    Comment: Performed at [facility] [address] [place], [place] [address]  Troponin I (High Sensitivity)     Status: None   Collection Time: 11/15/20 12:05 PM  Result Value Ref Range   Troponin I (High Sensitivity) 3 <18 ng/L    Comment: (NOTE) Elevated high sensitivity troponin I (hsTnI) values and significant  changes across serial measurements may suggest ACS but many other  chronic and acute conditions are known to elevate hsTnI results.  Refer to the "Links" section for chest pain algorithms and additional  guidance. Performed at [facility], [place] [address] [place], [place] [address]   Magnesium     Status: None   Collection Time: 11/15/20 12:05 PM  Result Value Ref Range   Magnesium 2.3 1.7 - 2.4 mg/dL    Comment: Performed at [facility], [place] [address], [place], [place] [address]  D-dimer, quantitative  Status: Abnormal   Collection Time: 11/15/20 12:05 PM  Result Value  Ref Range   D-Dimer, Quant 0.53 (H) 0.00 - 0.50 ug/mL-FEU    Comment: (NOTE) At the manufacturer cut-off value of 0.5 g/mL FEU, this assay has a negative predictive value of 95-100%.This assay is intended for use in conjunction with a clinical pretest probability (PTP) assessment model to exclude pulmonary embolism (PE) and deep venous thrombosis (DVT) in outpatients suspected of PE or DVT. Results should be correlated with clinical presentation. Performed at [facility], [place] [address], [place], [place] [address]   Resp Panel by RT-PCR (Flu A&B, Covid) Nasopharyngeal Swab     Status: None   Collection Time: 11/15/20 12:05 PM   Specimen: Nasopharyngeal Swab; Nasopharyngeal(NP) swabs in vial transport medium  Result Value Ref Range   SARS Coronavirus 2 by RT PCR NEGATIVE NEGATIVE    Comment: (NOTE) SARS-CoV-2 target nucleic acids are NOT DETECTED.  The SARS-CoV-2 RNA is generally detectable in upper respiratory specimens during the acute phase of infection. The lowest concentration of SARS-CoV-2 viral copies this assay can detect is 138 copies/mL. A negative result does not preclude SARS-Cov-2 infection and should not be used as the sole basis for treatment or other patient management decisions. A negative result may occur with  improper specimen collection/handling, submission of specimen other than nasopharyngeal swab, presence of viral mutation(s) within the areas targeted by this assay, and inadequate number of viral copies(<138 copies/mL). A negative result must be combined with clinical observations, patient history, and epidemiological information. The expected result is Negative.  Fact Sheet for Patients:  EntrepreneurPulse.com.au  Fact Sheet for Healthcare Providers:  IncredibleEmployment.be  This test is no t yet approved or cleared by the [place] FDA and  has been authorized for detection and/or diagnosis of SARS-CoV-2  by FDA under an Emergency Use Authorization (EUA). This EUA will remain  in effect (meaning this test can be used) for the duration of the COVID-19 declaration under Section 564(b)(1) of the Act, 21 U.S.C.section 360bbb-3(b)(1), unless the authorization is terminated  or revoked sooner.       Influenza A by PCR NEGATIVE NEGATIVE   Influenza B by PCR NEGATIVE NEGATIVE    Comment: (NOTE) The Xpert Xpress SARS-CoV-2/FLU/RSV plus assay is intended as an aid in the diagnosis of influenza from Nasopharyngeal swab specimens and should not be used as a sole basis for treatment. Nasal washings and aspirates are unacceptable for Xpert Xpress SARS-CoV-2/FLU/RSV testing.  Fact Sheet for Patients: EntrepreneurPulse.com.au  Fact Sheet for Healthcare Providers: IncredibleEmployment.be  This test is not yet approved or cleared by the [place] FDA and has been authorized for detection and/or diagnosis of SARS-CoV-2 by FDA under an Emergency Use Authorization (EUA). This EUA will remain in effect (meaning this test can be used) for the duration of the COVID-19 declaration under Section 564(b)(1) of the Act, 21 U.S.C. section 360bbb-3(b)(1), unless the authorization is terminated or revoked.  Performed at [facility], [place] [address], [place], [place] [address]   Troponin I (High Sensitivity)     Status: None   Collection Time: 11/15/20  2:28 PM  Result Value Ref Range   Troponin I (High Sensitivity) 4 <18 ng/L    Comment: (NOTE) Elevated high sensitivity troponin I (hsTnI) values and significant  changes across serial measurements may suggest ACS but many other  chronic and acute conditions are known to elevate hsTnI results.  Refer to the "Links" section for chest pain  algorithms and additional  guidance. Performed at [facility], [place] [address], [place], [place] [address]     CT Head Wo Contrast  Result Date: 11/15/2020 CLINICAL  DATA:  Possible seizure or syncope EXAM: CT HEAD WITHOUT CONTRAST TECHNIQUE: Contiguous axial images were obtained from the base of the skull through the vertex without intravenous contrast. COMPARISON:  None. FINDINGS: Brain: There is no acute intracranial hemorrhage. Low attenuation is present in the left temporal lobe extending posterosuperiorly into the deep white matter tracks there is mild regional mass effect. No substantial midline shift. No hydrocephalus. Gray-white differentiation is otherwise preserved. Vascular: No hyperdense vessel or unexpected calcification. Skull: Calvarium is unremarkable. Sinuses/Orbits: No acute finding. Other: None. IMPRESSION: Edema centered within the left temporal lobe suspicious for underlying lesion. Contrast enhanced MRI recommended for further evaluation. Electronically Signed   By: Macy Mis M.D.   On: 11/15/2020 15:07   DG Chest Portable 1 View  Result Date: 11/15/2020 CLINICAL DATA:  Syncope with incontinence. EXAM: PORTABLE CHEST 1 VIEW COMPARISON:  11/08/2020 FINDINGS: The heart size and mediastinal contours are within normal limits. Decreased lung volumes. No airspace opacities. The visualized skeletal structures are unremarkable. IMPRESSION: Low lung volumes.  No airspace opacities identified. Electronically Signed   By: Kerby Moors M.D.   On: 11/15/2020 13:41    Review of Systems  Neurological:  Positive for seizures and headaches.  Blood pressure 124/87, pulse 65, temperature 98.3 F (36.8 C), temperature source Oral, resp. rate 15, height 5\' 8"  (1.727 m), weight 77.1 kg, SpO2 100 %. Physical Exam Neurological:     Comments: Patient is somnolent but arousable pupils are equal extract movements are intact cranial nerves are intact except for a left seventh consistent with a chronic Bell's palsy , strength is 5 out of 5 upper and lower extremities with no pronator drift    Assessment/Plan: 55 year old female with new diagnosis of hypodensity  left temporal lobe consistent with vasogenic edema possible underlying mass.  MRI with and without contrast pending does not like patient has seizure neurology is evaluating and treating would recommend Keppra but defer to neurology.  Would recommend Decadron for vasogenic edema await MRI scan.  [name] 11/15/2020, 3:45 PM

## 2020-11-15 NOTE — ED Provider Notes (Signed)
[facility] EMERGENCY DEPARTMENT Provider Note   CSN: [id number] Arrival date & time: 11/15/20  0258     History Chief Complaint  Patient presents with   Loss of Consciousness    [name] is a 55 y.o. female.  Patient states since her COVID diagnosis several months ago she has not felt very well.  She states that she woke up around 3AM and did not feel well and then had an episode of loss of consciousness.  She woke up with some incontinence.  She thought that she might have a seizure.  No history of seizures.  She has been having some spells recently.  She thought the left side of her face felt twisted.  She has a history of Bell's palsy with chronic left-sided facial weakness.  She has no current weakness or numbness but she is feels lethargic and still tired.  She denies any chest pain or shortness of breath.  The history is provided by the patient.  Loss of Consciousness Episode history:  Single Most recent episode:  Today Duration:  8 hours Progression:  Resolved Chronicity:  New Relieved by:  Nothing Worsened by:  Nothing Associated symptoms: focal weakness (left sided?), headaches, seizures (?) and weakness   Associated symptoms: no anxiety, no chest pain, no confusion, no fever, no palpitations, no shortness of breath and no vomiting   Risk factors: no seizures       Past Medical History:  Diagnosis Date   Bell's palsy    with res left lag   Essential hypertension    External hemorrhoids    History of pneumonia    Hyperlipidemia    Hyperthyroidism    hx 55 years old- oral meds   Iron deficiency anemia    Migraine headache    PPD positive    Pulmonary nodules    not seen on plain cxr, first detected 1/07 re ct 10/08 pos IPPD > 15 mm 12/09 FOB/lavage 04/20/08 neg afb smear   PVC's (premature ventricular contractions)     Patient Active Problem List   Diagnosis Date Noted   OSA (obstructive sleep apnea) 06/13/2017   Hypothyroid  06/26/2015   Brachial neuritis 03/29/2015   Rotator cuff tear 12/21/2014   Abdominal pain, right upper quadrant 03/14/2014   Leg cramps 07/15/2013   Acquired anal stenosis 06/18/2011   Chronic constipation 06/18/2011   PVC's (premature ventricular contractions) 05/23/2011   Headache, migraine, intractable 05/09/2011   Asthma exacerbation, allergic 07/31/2010   POSITIVE PPD 05/04/2008   Hyperlipidemia 02/05/2008   Essential hypertension 02/05/2008   Pulmonary nodules 02/05/2008   Chronic cough 02/05/2008    Past Surgical History:  Procedure Laterality Date   ABDOMINAL HYSTERECTOMY     BREAST BIOPSY Right    CHOLECYSTECTOMY  2014   COLONOSCOPY  05/04/2009   prolapsing external hemorrhoids   ENDOMETRIAL ABLATION     HEMORRHOID SURGERY     x 3   TUBAL LIGATION       OB History     Gravida  3   Para  2   Term      Preterm      AB      Living  2      SAB      IAB      Ectopic      Multiple      Live Births              Family History  Problem Relation  Age of Onset   Heart disease Maternal Grandmother        aunts, uncles, sister   Diabetes Mellitus II Maternal Grandmother    Kidney failure Sister    Diabetes Mellitus II Sister    Lung cancer Other        aunts and uncles   Colon cancer Maternal Aunt        aunts and uncles   Diabetes Mellitus II Maternal Aunt    Colon cancer Maternal Uncle    Diabetes Mellitus II Maternal Uncle    Diabetes type II Mother     Social History   Tobacco Use   Smoking status: Never   Smokeless tobacco: Never  Substance Use Topics   Alcohol use: No    Alcohol/week: 0.0 standard drinks   Drug use: No    Home Medications Prior to Admission medications   Medication Sig Start Date End Date Taking? Authorizing Provider  albuterol (VENTOLIN HFA) 108 (90 Base) MCG/ACT inhaler Inhale 2 puffs into the lungs every 6 (six) hours as needed for wheezing or shortness of breath. 11/09/20  Yes Nafziger, [name], NP   atorvastatin (LIPITOR) 20 MG tablet Take 1 tablet (20 mg total) by mouth daily. 11/08/20  Yes Nafziger, [name], NP  Cholecalciferol (DIALYVITE VITAMIN D 5000) 125 MCG (5000 UT) capsule Take 1 capsule (5,000 Units total) by mouth daily. 01/26/20  Yes Nafziger, [name], NP  hydrochlorothiazide (HYDRODIURIL) 25 MG tablet TAKE 1 TABLET (25 MG TOTAL) BY MOUTH DAILY. 11/09/20  Yes Nafziger, [name], NP  levothyroxine (SYNTHROID) 100 MCG tablet Take 1 tablet (100 mcg total) by mouth daily. 01/26/20  Yes Nafziger, [name], NP  losartan (COZAAR) 100 MG tablet TAKE 1 TABLET BY MOUTH EVERY DAY Patient taking differently: Take 100 mg by mouth daily. 11/09/20  Yes Nafziger, [name], NP  potassium chloride (KLOR-CON) 10 MEQ tablet Take 1 tablet (10 mEq total) by mouth daily. 01/26/20  Yes Nafziger, [name], NP  Spacer/Aero-Holding Dorise Bullion Use with inhaler Patient taking differently: 1 each by Other route See admin instructions. Use with inhaler 11/09/20  Yes Nafziger, [name], NP  HYDROcodone bit-homatropine (HYCODAN) 5-1.5 MG/5ML syrup Take 5 mLs by mouth every 8 (eight) hours as needed for cough. Patient not taking: Reported on 11/15/2020 10/11/20   [name], NP  montelukast (SINGULAIR) 10 MG tablet Take 1 tablet by mouth at bedtime. Patient not taking: Reported on 11/15/2020 09/29/19   [name], NP  SUMAtriptan (IMITREX) 50 MG tablet Take 1 tablet (50 mg total) by mouth every 2 (two) hours as needed for migraine. May repeat in 2 hours if headache persists or recurs. Patient not taking: Reported on 11/15/2020 05/19/20   [name], NP    Allergies    Patient has no known allergies.  Review of Systems   Review of Systems  Constitutional:  Negative for chills and fever.  HENT:  Negative for ear pain and sore throat.   Eyes:  Positive for pain and visual disturbance.  Respiratory:  Negative for cough and shortness of breath.   Cardiovascular:  Positive for syncope. Negative for chest pain and palpitations.   Gastrointestinal:  Negative for abdominal pain and vomiting.  Genitourinary:  Negative for dysuria and hematuria.  Musculoskeletal:  Negative for arthralgias and back pain.  Skin:  Negative for color change and rash.  Neurological:  Positive for focal weakness (left sided?), seizures (?), syncope, weakness and headaches.  Psychiatric/Behavioral:  Negative for confusion.   All other systems reviewed and are negative.  Physical Exam Updated Vital Signs BP 124/87   Pulse 65   Temp 98.3 F (36.8 C) (Oral)   Resp 15   Ht 5\' 8"  (1.727 m)   Wt 77.1 kg   SpO2 100%   BMI 25.85 kg/m   Physical Exam Vitals and nursing note reviewed.  Constitutional:      General: She is not in acute distress.    Appearance: She is well-developed. She is not ill-appearing.  HENT:     Head: Normocephalic and atraumatic.     Mouth/Throat:     Mouth: Mucous membranes are moist.  Eyes:     Extraocular Movements: Extraocular movements intact.     Conjunctiva/sclera: Conjunctivae normal.     Pupils: Pupils are equal, round, and reactive to light.     Comments: Fluorescein stain of eyes is normal, eye pressure normal (17 in right eye), no visual field deficit  Cardiovascular:     Rate and Rhythm: Normal rate and regular rhythm.     Pulses: Normal pulses.     Heart sounds: Normal heart sounds. No murmur heard. Pulmonary:     Effort: Pulmonary effort is normal. No respiratory distress.     Breath sounds: Normal breath sounds.  Abdominal:     Palpations: Abdomen is soft.     Tenderness: There is no abdominal tenderness.  Musculoskeletal:        General: Normal range of motion.     Cervical back: Neck supple.  Skin:    General: Skin is warm and dry.     Capillary Refill: Capillary refill takes less than 2 seconds.  Neurological:     General: No focal deficit present.     Mental Status: She is alert and oriented to person, place, and time.     Cranial Nerves: No cranial nerve deficit.     Sensory: No  sensory deficit.     Motor: No weakness.     Coordination: Coordination normal.     Comments: He has some left-sided facial droop which patient states chronic? otherwise normal strength throughout, normal sensation, no drift, normal speech. No visual field deficits    ED Results / Procedures / Treatments   Labs (all labs ordered are listed, but only abnormal results are displayed) Labs Reviewed  BASIC METABOLIC PANEL - Abnormal; Notable for the following components:      Result Value   Potassium 2.7 (*)    Glucose, Bld 117 (*)    Creatinine, Ser 1.11 (*)    GFR, Estimated 59 (*)    All other components within normal limits  CBC - Abnormal; Notable for the following components:   WBC 10.6 (*)    All other components within normal limits  URINALYSIS, ROUTINE W REFLEX MICROSCOPIC - Abnormal; Notable for the following components:   APPearance CLOUDY (*)    Hgb urine dipstick MODERATE (*)    All other components within normal limits  HEPATIC FUNCTION PANEL - Abnormal; Notable for the following components:   AST 52 (*)    Total Bilirubin 1.7 (*)    Indirect Bilirubin 1.5 (*)    All other components within normal limits  D-DIMER, QUANTITATIVE - Abnormal; Notable for the following components:   D-Dimer, Quant 0.53 (*)    All other components within normal limits  CBG MONITORING, ED - Abnormal; Notable for the following components:   Glucose-Capillary 121 (*)    All other components within normal limits  RESP PANEL BY RT-PCR (FLU A&B, COVID) ARPGX2  MAGNESIUM  POTASSIUM  PHOSPHORUS  TROPONIN I (HIGH SENSITIVITY)  TROPONIN I (HIGH SENSITIVITY)    EKG EKG Interpretation  Date/Time:  Wednesday November 15 2020 09:47:25 EDT Ventricular Rate:  82 PR Interval:  168 QRS Duration: 84 QT Interval:  376 QTC Calculation: 439 R Axis:   32 Text Interpretation: Normal sinus rhythm Normal ECG Confirmed by Lennice Sites ([id number]) on 11/15/2020 11:46:06 AM  Radiology CT Head Wo  Contrast  Result Date: 11/15/2020 CLINICAL DATA:  Possible seizure or syncope EXAM: CT HEAD WITHOUT CONTRAST TECHNIQUE: Contiguous axial images were obtained from the base of the skull through the vertex without intravenous contrast. COMPARISON:  None. FINDINGS: Brain: There is no acute intracranial hemorrhage. Low attenuation is present in the left temporal lobe extending posterosuperiorly into the deep white matter tracks there is mild regional mass effect. No substantial midline shift. No hydrocephalus. Gray-white differentiation is otherwise preserved. Vascular: No hyperdense vessel or unexpected calcification. Skull: Calvarium is unremarkable. Sinuses/Orbits: No acute finding. Other: None. IMPRESSION: Edema centered within the left temporal lobe suspicious for underlying lesion. Contrast enhanced MRI recommended for further evaluation. Electronically Signed   By: Macy Mis M.D.   On: 11/15/2020 15:07   DG Chest Portable 1 View  Result Date: 11/15/2020 CLINICAL DATA:  Syncope with incontinence. EXAM: PORTABLE CHEST 1 VIEW COMPARISON:  11/08/2020 FINDINGS: The heart size and mediastinal contours are within normal limits. Decreased lung volumes. No airspace opacities. The visualized skeletal structures are unremarkable. IMPRESSION: Low lung volumes.  No airspace opacities identified. Electronically Signed   By: Kerby Moors M.D.   On: 11/15/2020 13:41    Procedures .Critical Care  Date/Time: 11/15/2020 3:23 PM Performed by: Lennice Sites, DO Authorized by: Lennice Sites, DO   Critical care provider statement:    Critical care time (minutes):  45   Critical care was necessary to treat or prevent imminent or life-threatening deterioration of the following conditions:  CNS failure or compromise   Critical care was time spent personally by me on the following activities:  Blood draw for specimens, development of treatment plan with patient or surrogate, discussions with consultants,  discussions with primary provider, evaluation of patient's response to treatment, examination of patient, obtaining history from patient or surrogate, ordering and performing treatments and interventions, ordering and review of laboratory studies, ordering and review of radiographic studies, pulse oximetry, re-evaluation of patient's condition and review of old charts   I assumed direction of critical care for this patient from another provider in my specialty: no     Care discussed with: admitting provider     Medications Ordered in ED Medications  potassium chloride SA (KLOR-CON) CR tablet 40 mEq (40 mEq Oral Given 11/15/20 1213)  LORazepam (ATIVAN) tablet 0.5 mg (has no administration in time range)  dexamethasone (DECADRON) injection 10 mg (has no administration in time range)  levETIRAcetam (KEPPRA) IVPB 500 mg/100 mL premix (has no administration in time range)  fluorescein ophthalmic strip 1 strip (1 strip Right Eye Given 11/15/20 1209)  potassium chloride 10 mEq in 100 mL IVPB (0 mEq Intravenous Stopped 11/15/20 1433)  sodium chloride 0.9 % bolus 1,000 mL (1,000 mLs Intravenous New Bag/Given 11/15/20 1209)  prochlorperazine (COMPAZINE) injection 10 mg (10 mg Intravenous Given 11/15/20 1211)  diphenhydrAMINE (BENADRYL) injection 12.5 mg (12.5 mg Intravenous Given 11/15/20 1210)  tetracaine (PONTOCAINE) 0.5 % ophthalmic solution 1 drop (1 drop Both Eyes Given by Other 11/15/20 1247)    ED Course  I have reviewed the triage vital  signs and the nursing notes.  Pertinent labs & imaging results that were available during my care of the patient were reviewed by me and considered in my medical decision making (see chart for details).    MDM Rules/Calculators/A&P                          Bright is here after syncopal/seizure type event today.  Normal vitals.  No fever.  Patient states that she woke up around 3 AM not feeling very well.  She was sitting in bed when she thinks she lost  consciousness because the next thing she remembers is waking up and having some incontinence, left-sided weakness, confusion fatigue.  She has been having some headache, right eye pain and the feeling that the left side of her face is twisting.  She has a history of Bell's palsy with left-sided weakness at baseline.  Neurologically she appears intact at this time.  Eye exam is unremarkable.  Eye pressure is normal.  No concern for glaucoma.  She states that she has not really felt well since her COVID diagnosis a few months ago.  She has been having some episodes of presyncope recently.  Not sure if she is having some seizure episodes but today was the first time she fully lost consciousness.  She is not on blood thinners.  She denies any chest pain or shortness of breath.  Has a history of migraines and a mild headache currently.  Basic labs, head CT, chest x-ray to further evaluate.  Suspect will likely need MRI, echocardiogram, EEG.  Troponins are unremarkable.  No significant anemia, electrolyte abnormality, kidney injury otherwise.  Potassium was mildly low at 2.7 and repleted.  D-dimer age-adjusted is normal.  Urinalysis negative for infection.  Chest x-ray with no evidence of infection.  CT scan of the head as discussed on the phone with radiology shows edema centered within the left temporal lobe suspicious for an underlying lesion.  Talked with Dr. Saintclair Halsted with neurosurgery who recommends MRI with and without contrast as well as IV Decadron 10 mg every 6 and 500 mg of IV Keppra twice daily.  Patient to be admitted to medicine for further care.  No history of cancer.  Neurological exam is stable.  No other seizure-like events while here.  Admitted to medicine.  This chart was dictated using voice recognition software.  Despite best efforts to proofread,  errors can occur which can change the documentation meaning.    Final Clinical Impression(s) / ED Diagnoses Final diagnoses:  Vasogenic brain edema  ([place])  Seizure-like activity [facility])    Rx / DC Orders ED Discharge Orders     None        Lennice Sites, DO 11/15/20 1523

## 2020-11-15 NOTE — ED Triage Notes (Signed)
Pt c/o R eye pain and lip swelling that began last night. Pt also reports near syncopal episode during that time with incontinent episode. Pt denies any known exposure to allergies.

## 2020-11-15 NOTE — ED Notes (Signed)
Admitting doctor  In the room

## 2020-11-15 NOTE — Consult Note (Signed)
Neurology Consultation Reason for Consult: ? Seizure activity at home.  Requesting Physician: Celesta Gentile, MD.   CC: headache and questionable seizure.   History is obtained from: Patient, daughter, and chart.   HPI: [name] is a 55 y.o. female with a PMHx of chronic right facial droop from Bell's Palsy, HTN, HLD, chronic hypokalemia, Vitamin D deficiency, OSA, hypothyroidism, and asthma who presented today to ED with intense left sided HA with eye pain, n/v x 2, and photophobia. She has had a left sided HA on and off x 1 week accompanied by nausea, but no vomiting, no vision changes, or extremity weakness. Because patient did not feel well, she texted her daughter who came over. No one saw any seizure like activity, and patient has little memory of this a.m. events. Patient was sleeping when daughter arrived and the patient was easily arousable. The patient found herself wet with urine and changed her clothes. This occurred at some point, but unknown before/after event.   Patient with a known history of migraine headaches. Usually only has one every 2 months. She takes Sumatriptan and states it usually works. Her HA has resolved at time of exam.   No history of seizures. No FMHx of seizures. No history of cancer, but she has been followed for pulmonary nodules in the past. No history of anger issues, anxiety, or depression.   Workup thus far has revealed a CTH + for left temporal vasogenic edema and possible underlying mass. NSU saw patient and recommended Keppra 500mg  po q12 hours and Decadron for the edema. MRI brain positive for tumor as outlined below.   In review of chart, it appears her PCP called her in Sumatriptan today in response to c/o HA. Patient had COVID 09/2020. 11/08/20 OV mentioned some chronic fatigue thought to be from thyroid or OSA. TSH 1.78 and Vitamin B12 level 279.   Neurology was asked to consult for ? Seizure vs syncope episode.   ROS: All other review of systems was  negative except as noted in the HPI.    Past Medical History:  Diagnosis Date   Bell's palsy    with res left lag   Essential hypertension    External hemorrhoids    History of pneumonia    Hyperlipidemia    Hyperthyroidism    hx 55 years old- oral meds   Iron deficiency anemia    Migraine headache    PPD positive    Pulmonary nodules    not seen on plain cxr, first detected 1/07 re ct 10/08 pos IPPD > 15 mm 12/09 FOB/lavage 04/20/08 neg afb smear   PVC's (premature ventricular contractions)    Family History  Problem Relation Age of Onset   Heart disease Maternal Grandmother        aunts, uncles, sister   Diabetes Mellitus II Maternal Grandmother    Kidney failure Sister    Diabetes Mellitus II Sister    Lung cancer Other        aunts and uncles   Colon cancer Maternal Aunt        aunts and uncles   Diabetes Mellitus II Maternal Aunt    Colon cancer Maternal Uncle    Diabetes Mellitus II Maternal Uncle    Diabetes type II Mother    Social History:  reports that she has never smoked. She has never used smokeless tobacco. She reports that she does not drink alcohol and does not use drugs.  Exam: Current vital signs: BP  124/87   Pulse 65   Temp 98.3 F (36.8 C) (Oral)   Resp 15   Ht 5\' 8"  (1.727 m)   Wt 77.1 kg   SpO2 100%   BMI 25.85 kg/m  Vital signs in last 24 hours: Temp:  [98.3 F (36.8 C)] 98.3 F (36.8 C) (07/13 0934) Pulse Rate:  [65-87] 65 (07/13 1332) Resp:  [12-18] 15 (07/13 1332) BP: (123-133)/(82-87) 124/87 (07/13 1332) SpO2:  [100 %] 100 % (07/13 1332) Weight:  [77.1 kg] 77.1 kg (07/13 0934)   Physical Exam  Constitutional: Appears well and is alert in NAD.   Psych: Affect appropriate to situation. Understandably upset after discussion.  Eyes: Slight OD scleral injection. + Exotropia OU.  HENT: No oropharyngeal obstruction.  MSK: No joint deformities.  Cardiovascular: Normal rate and regular rhythm.  Respiratory: Effort normal,  non-labored breathing GI: Soft.  No distension. There is no tenderness.  Skin: Warm dry and intact visible skin  Neuro: Mental Status: Patient is awake, alert, oriented to person, place, month, year, and situation. Patient is able to give a clear and coherent history except has little recollection of events after she texted her daughter this a.m.  No signs of aphasia or neglect. Cranial Nerves: II: Visual Fields are full. Pupils are equal, round, and reactive to light.   III,IV, VI: EOMI without ptosis or diploplia.  V: Facial sensation is symmetric to light touch.  VII: Mild right facial droop, chronic.  VIII: hearing is intact to voice X: Uvula elevates symmetrically XI: Shoulder shrug is symmetric. XII: tongue is midline without atrophy or fasciculations.  Motor: Tone is normal. Bulk is normal. 5/5 strength was present in all four extremities. She does have some give away weakness to left thigh. Sensory: Sensation is symmetric to light touch throughout. No extinction to DSS with light touch.  Deep Tendon Reflexes: 2+ and symmetric in the biceps and patellae.  Plantars: Toes are downgoing bilaterally.  Cerebellar: FNF and HKS are intact bilaterally.  I have reviewed labs in epic and the results pertinent to this consultation are: K 2.7, Mag 2.3, Vitamn B12 279, last TSH 1.78. WBCC 10.6.   Imaging:  I have personally reviewed the images obtained: CTH Edema centered within the left temporal lobe suspicious for underlying lesion. Contrast enhanced MRI recommended for further evaluation.   MRI brain with and without contrast.  Necrotic left temporal lobe mass with intralesional hemorrhage. Surrounding edema and/or infiltrating tumor. Mild regional mass effect. Differential considerations are metastatic lesion and high-grade glioma.  Assessment: 55 yo female who presented with HA x one week, intensified today accompanied by vomiting, and visual disturbances with questionable  syncope vs seizure at home.  Given CTH results, a MRI brain was completed with above results. NSU has been consulted but will need to weigh in on any necessary surgery or treatment at this time. Patient to be on Keppra prophylactically and Decadron for edema. Although she has a hx of migraine headaches, her intensified HA and query seizure are likely secondary to brain mass. She has no known primary carcinoma, but this should be evaluated for with CT chest abdomen pelvis. She has no psychiatric issue to prevent starting of Keppra.   Impression:  -Necrotic left temporal mass with vasogenic edema vs infiltrating tumor.  -Suspicion for metastatic lesion vs. glioma.  -Likely seizure secondary to brain mass, mass is also in a highly epileptogenic location and patient merits antiseizure medication.  -Worsened HA likely secondary to brain mass.  Recommendations: -Appreciate NSU recommendations. Will need further weigh in on tumor workup and treatment.   -CT chest, abdomen and pelvis for with and without contrast primary malignancy screening -Vitamin B12 level 279 at PCP. Will start supplementation for a goal over 500.   -Keppra 500mg  IV q12 hours, convert to oral at one-to-one ratio.  -Agree with Decadron, taper per neuro oncology.  -Neuro oncology has weighed in. Plan workup for primary carcinoma.  -In patient seizure precautions.  -Neurology checks q 4 hours.  -Appreciate Dr. Renda Rolls involvement, inpatient neuro hospitalist team will be available as needed but defer to Dr. Mickeal Skinner and neurosurgery otherwise  Patient seen by Clance Boll, NP and later by MD. Note/plan to be edited by MD as needed.   Attending Neurologist's note:  I personally saw this patient, gathering history, performing a full neurologic examination, reviewing relevant labs, personally reviewing relevant imaging including head CT and MRI brain, the latter of which was reviewed with patient and daughter, and formulated the  assessment and plan, adding the note above for completeness and clarity to accurately reflect my thoughts.  On my examination patient was quite somnolent, but could arouse briefly with repeated stimulation.  She did have a notable right facial droop, but cranial nerves are otherwise intact.  Right upper extremity pronator drift and was 4+/5 throughout compared to the left upper extremity which was 5/5.  Right lower extremity was also 4 to 4+/5 with an upper motor neuron pattern of weakness, while left lower extremity was 5/5 throughout.  Cerebellar testing was intact.   [name] Noe MD-PhD [facility] [phone] Available 7 AM to 7 PM, outside these hours please contact Neurologist on call listed on AMION

## 2020-11-15 NOTE — ED Notes (Signed)
Pt remains in mri.

## 2020-11-15 NOTE — ED Notes (Signed)
Patient transported to CT 

## 2020-11-15 NOTE — ED Notes (Signed)
The pt returned from mri 

## 2020-11-15 NOTE — H&P (Signed)
History and Physical    [name] DJS:[id number] DOB: 1965/06/05 DOA: 11/15/2020  PCP: [name], NP (Confirm with patient/family/NH records and if not entered, this has to be entered at [facility] point of entry) Patient coming from: Home  I have personally briefly reviewed patient's old medical records in [place]  Chief Complaint: I passed out.  HPI: [name] is a 55 y.o. female with medical history significant of Bell's palsy with chronic left facial droop, HTN, chronic hypokalemia on supplement, hypothyroidism, asthma, presented with new onset of headache, syncope versus seizure episode.  Patient started to have left-sided headache on and off for the whole week, usually lasted for few hours associated with nauseous but no vomiting, not related to body movement, denied any blurry vision numbness weakness of the limbs.  This morning, woke up with severe right-sided eye pain and again developed left-sided throbbing-like headache along with nausea and vomited stomach content x2.  She then called her daughter, and daughter arrived and found patient in bed incontinent of urine and confused.  Patient reported she might passed out " for some time" before her daughter came in, but cannot tell more details.  Again denied any numbness weakness of the abdominal limbs.  No tongue biting.  ED Course: CT head found edema centered within the left temporal lobe suspicious for underlying lesion.  K2.7.  Review of Systems: As per HPI otherwise 14 point review of systems negative.    Past Medical History:  Diagnosis Date   Bell's palsy    with res left lag   Essential hypertension    External hemorrhoids    History of pneumonia    Hyperlipidemia    Hyperthyroidism    hx 55 years old- oral meds   Iron deficiency anemia    Migraine headache    PPD positive    Pulmonary nodules    not seen on plain cxr, first detected 1/07 re ct 10/08 pos IPPD > 15 mm 12/09 FOB/lavage 04/20/08  neg afb smear   PVC's (premature ventricular contractions)     Past Surgical History:  Procedure Laterality Date   ABDOMINAL HYSTERECTOMY     BREAST BIOPSY Right    CHOLECYSTECTOMY  2014   COLONOSCOPY  05/04/2009   prolapsing external hemorrhoids   ENDOMETRIAL ABLATION     HEMORRHOID SURGERY     x 3   TUBAL LIGATION       reports that she has never smoked. She has never used smokeless tobacco. She reports that she does not drink alcohol and does not use drugs.  No Known Allergies  Family History  Problem Relation Age of Onset   Heart disease Maternal Grandmother        aunts, uncles, sister   Diabetes Mellitus II Maternal Grandmother    Kidney failure Sister    Diabetes Mellitus II Sister    Lung cancer Other        aunts and uncles   Colon cancer Maternal Aunt        aunts and uncles   Diabetes Mellitus II Maternal Aunt    Colon cancer Maternal Uncle    Diabetes Mellitus II Maternal Uncle    Diabetes type II Mother     Prior to Admission medications   Medication Sig Start Date End Date Taking? Authorizing Provider  albuterol (VENTOLIN HFA) 108 (90 Base) MCG/ACT inhaler Inhale 2 puffs into the lungs every 6 (six) hours as needed for wheezing or shortness of breath. 11/09/20  Yes Nafziger, [name], NP  atorvastatin (LIPITOR) 20 MG tablet Take 1 tablet (20 mg total) by mouth daily. 11/08/20  Yes Nafziger, [name], NP  Cholecalciferol (DIALYVITE VITAMIN D 5000) 125 MCG (5000 UT) capsule Take 1 capsule (5,000 Units total) by mouth daily. 01/26/20  Yes Nafziger, [name], NP  hydrochlorothiazide (HYDRODIURIL) 25 MG tablet TAKE 1 TABLET (25 MG TOTAL) BY MOUTH DAILY. 11/09/20  Yes Nafziger, [name], NP  levothyroxine (SYNTHROID) 100 MCG tablet Take 1 tablet (100 mcg total) by mouth daily. 01/26/20  Yes Nafziger, [name], NP  losartan (COZAAR) 100 MG tablet TAKE 1 TABLET BY MOUTH EVERY DAY Patient taking differently: Take 100 mg by mouth daily. 11/09/20  Yes Nafziger, [name], NP  potassium chloride  (KLOR-CON) 10 MEQ tablet Take 1 tablet (10 mEq total) by mouth daily. 01/26/20  Yes Nafziger, [name], NP  Spacer/Aero-Holding Dorise Bullion Use with inhaler Patient taking differently: 1 each by Other route See admin instructions. Use with inhaler 11/09/20  Yes Nafziger, [name], NP  HYDROcodone bit-homatropine (HYCODAN) 5-1.5 MG/5ML syrup Take 5 mLs by mouth every 8 (eight) hours as needed for cough. Patient not taking: Reported on 11/15/2020 10/11/20   [name], NP  montelukast (SINGULAIR) 10 MG tablet Take 1 tablet by mouth at bedtime. Patient not taking: Reported on 11/15/2020 09/29/19   [name], NP  SUMAtriptan (IMITREX) 50 MG tablet Take 1 tablet (50 mg total) by mouth every 2 (two) hours as needed for migraine. May repeat in 2 hours if headache persists or recurs. Patient not taking: Reported on 11/15/2020 05/19/20   [name], NP    Physical Exam: Vitals:   11/15/20 0934 11/15/20 1200 11/15/20 1332  BP: 133/82 123/83 124/87  Pulse: 87 66 65  Resp: 18 12 15   Temp: 98.3 F (36.8 C)    TempSrc: Oral    SpO2: 100% 100% 100%  Weight: 77.1 kg    Height: 5\' 8"  (1.727 m)      Constitutional: NAD, calm, comfortable Vitals:   11/15/20 0934 11/15/20 1200 11/15/20 1332  BP: 133/82 123/83 124/87  Pulse: 87 66 65  Resp: 18 12 15   Temp: 98.3 F (36.8 C)    TempSrc: Oral    SpO2: 100% 100% 100%  Weight: 77.1 kg    Height: 5\' 8"  (1.727 m)     Eyes: PERRL, lids and conjunctivae normal ENMT: Mucous membranes are moist. Posterior pharynx clear of any exudate or lesions.Normal dentition.  Neck: normal, supple, no masses, no thyromegaly Respiratory: clear to auscultation bilaterally, no wheezing, no crackles. Normal respiratory effort. No accessory muscle use.  Cardiovascular: Regular rate and rhythm, no murmurs / rubs / gallops. No extremity edema. 2+ pedal pulses. No carotid bruits.  Abdomen: no tenderness, no masses palpated. No hepatosplenomegaly. Bowel sounds positive.   Musculoskeletal: no clubbing / cyanosis. No joint deformity upper and lower extremities. Good ROM, no contractures. Normal muscle tone.  Skin: no rashes, lesions, ulcers. No induration Neurologic: Left facial droop sensation intact, DTR normal. Strength 5/5 in all 4.  Psychiatric: Normal judgment and insight. Alert and oriented x 3. Normal mood.     Labs on Admission: I have personally reviewed following labs and imaging studies  CBC: Recent Labs  Lab 11/15/20 0938  WBC 10.6*  HGB 14.0  HCT 42.0  MCV 89.6  PLT 950   Basic Metabolic Panel: Recent Labs  Lab 11/15/20 0938 11/15/20 1205  NA 138  --   K 2.7*  --   CL 101  --  CO2 24  --   GLUCOSE 117*  --   BUN 9  --   CREATININE 1.11*  --   CALCIUM 9.6  --   MG  --  2.3   GFR: Estimated Creatinine Clearance: 64 mL/min (A) (by C-G formula based on SCr of 1.11 mg/dL (H)). Liver Function Tests: Recent Labs  Lab 11/15/20 1205  AST 52*  ALT 25  ALKPHOS 87  BILITOT 1.7*  PROT 7.4  ALBUMIN 4.2   No results for input(s): LIPASE, AMYLASE in the last 168 hours. No results for input(s): AMMONIA in the last 168 hours. Coagulation Profile: No results for input(s): INR, PROTIME in the last 168 hours. Cardiac Enzymes: No results for input(s): CKTOTAL, CKMB, CKMBINDEX, TROPONINI in the last 168 hours. BNP (last 3 results) No results for input(s): PROBNP in the last 8760 hours. HbA1C: No results for input(s): HGBA1C in the last 72 hours. CBG: Recent Labs  Lab 11/15/20 0942  GLUCAP 121*   Lipid Profile: No results for input(s): CHOL, HDL, LDLCALC, TRIG, CHOLHDL, LDLDIRECT in the last 72 hours. Thyroid Function Tests: No results for input(s): TSH, T4TOTAL, FREET4, T3FREE, THYROIDAB in the last 72 hours. Anemia Panel: No results for input(s): VITAMINB12, FOLATE, FERRITIN, TIBC, IRON, RETICCTPCT in the last 72 hours. Urine analysis:    Component Value Date/Time   COLORURINE YELLOW 11/15/2020 0938   APPEARANCEUR  CLOUDY (A) 11/15/2020 0938   LABSPEC 1.018 11/15/2020 0938   PHURINE 5.0 11/15/2020 0938   GLUCOSEU NEGATIVE 11/15/2020 0938   HGBUR MODERATE (A) 11/15/2020 0938   HGBUR negative 01/26/2010 1528   BILIRUBINUR NEGATIVE 11/15/2020 0938   BILIRUBINUR n 05/23/2011 1649   KETONESUR NEGATIVE 11/15/2020 0938   PROTEINUR NEGATIVE 11/15/2020 0938   UROBILINOGEN 1.0 06/22/2012 1540   NITRITE NEGATIVE 11/15/2020 0938   LEUKOCYTESUR NEGATIVE 11/15/2020 0938    Radiological Exams on Admission: CT Head Wo Contrast  Result Date: 11/15/2020 CLINICAL DATA:  Possible seizure or syncope EXAM: CT HEAD WITHOUT CONTRAST TECHNIQUE: Contiguous axial images were obtained from the base of the skull through the vertex without intravenous contrast. COMPARISON:  None. FINDINGS: Brain: There is no acute intracranial hemorrhage. Low attenuation is present in the left temporal lobe extending posterosuperiorly into the deep white matter tracks there is mild regional mass effect. No substantial midline shift. No hydrocephalus. Gray-white differentiation is otherwise preserved. Vascular: No hyperdense vessel or unexpected calcification. Skull: Calvarium is unremarkable. Sinuses/Orbits: No acute finding. Other: None. IMPRESSION: Edema centered within the left temporal lobe suspicious for underlying lesion. Contrast enhanced MRI recommended for further evaluation. Electronically Signed   By: Macy Mis M.D.   On: 11/15/2020 15:07   DG Chest Portable 1 View  Result Date: 11/15/2020 CLINICAL DATA:  Syncope with incontinence. EXAM: PORTABLE CHEST 1 VIEW COMPARISON:  11/08/2020 FINDINGS: The heart size and mediastinal contours are within normal limits. Decreased lung volumes. No airspace opacities. The visualized skeletal structures are unremarkable. IMPRESSION: Low lung volumes.  No airspace opacities identified. Electronically Signed   By: Kerby Moors M.D.   On: 11/15/2020 13:41    EKG: Independently reviewed. Sinus, no PR  or QTC issue  Assessment/Plan Active Problems:   Seizure ([place])  (please populate well all problems here in Problem List. (For example, if patient is on BP meds at home and you resume or decide to hold them, it is a problem that needs to be her. Same for CAD, COPD, HLD and so on)  Probably new onset seizure -Syncope less likely.  Given there is no prodromes.  Although hypokalemia MBR potential risk factor for syncope this point, especially arrhythmia. -With new onset of left temporal lesion with cerebral edema. -Order MRI and EEG. -Keppra 500 mg twice daily as per neurosurgery.  Cerebral edema -Suspect this is subacute given her  -Suspect malignant lesion left temporal lobe, neurosurgery informed, who recommended Decadron. -Frequent neurochecks -Admit patient to stepdown unit.  Hypokalemia -Magnesium level normal, p.o. replacement x2, recheck K level tonight.  HTN -Controlled. Continue HCTZ and ARB.  Hypothyroidism -Continue Synthroid  DVT prophylaxis: SCD (unsure about the nature of brain lesion) Code Status: Full Code Family Communication: Daughter at bedside Disposition Plan: Expect more than 3 days hospital stay, likely need more time work-up on the intracranial lesion Consults called: Neurosurgery Admission status: PCU   Lequita Halt MD [facility] Pager [phone]  11/15/2020, 3:54 PM

## 2020-11-15 NOTE — ED Notes (Signed)
Unsuccessful attempt  To call report

## 2020-11-15 NOTE — ED Notes (Signed)
The pt has finished her oral contrast

## 2020-11-15 NOTE — Progress Notes (Signed)
MRI result reviewed with neuro oncology Dr. Mickeal Skinner, who recommend Decadron and Keppra, which already ordered.  Neuro oncology also recommend Pan CT chest/abd/Pelvis w and w/o contrast.  Explained the MRI result to patient and her daughter at bedside, all questions answered with my best knowledge.

## 2020-11-15 NOTE — ED Notes (Signed)
Critical potassium level reported to Dr Ronnald Nian

## 2020-11-15 NOTE — ED Notes (Signed)
Report given to rn on 3  w 

## 2020-11-16 ENCOUNTER — Inpatient Hospital Stay (HOSPITAL_COMMUNITY): Payer: BC Managed Care – PPO

## 2020-11-16 ENCOUNTER — Other Ambulatory Visit: Payer: Self-pay | Admitting: Neurological Surgery

## 2020-11-16 DIAGNOSIS — K869 Disease of pancreas, unspecified: Secondary | ICD-10-CM | POA: Diagnosis not present

## 2020-11-16 DIAGNOSIS — I10 Essential (primary) hypertension: Secondary | ICD-10-CM | POA: Diagnosis not present

## 2020-11-16 DIAGNOSIS — E041 Nontoxic single thyroid nodule: Secondary | ICD-10-CM | POA: Diagnosis not present

## 2020-11-16 DIAGNOSIS — E876 Hypokalemia: Secondary | ICD-10-CM

## 2020-11-16 DIAGNOSIS — R569 Unspecified convulsions: Secondary | ICD-10-CM | POA: Diagnosis not present

## 2020-11-16 DIAGNOSIS — N6489 Other specified disorders of breast: Secondary | ICD-10-CM

## 2020-11-16 LAB — RENAL FUNCTION PANEL
Albumin: 3.4 g/dL — ABNORMAL LOW (ref 3.5–5.0)
Anion gap: 9 (ref 5–15)
BUN: 10 mg/dL (ref 6–20)
CO2: 23 mmol/L (ref 22–32)
Calcium: 9 mg/dL (ref 8.9–10.3)
Chloride: 108 mmol/L (ref 98–111)
Creatinine, Ser: 1.02 mg/dL — ABNORMAL HIGH (ref 0.44–1.00)
GFR, Estimated: >60 mL/min (ref >60)
Glucose, Bld: 134 mg/dL — ABNORMAL HIGH (ref 70–99)
Phosphorus: 4.2 mg/dL (ref 2.5–4.6)
Potassium: 4 mmol/L (ref 3.5–5.1)
Sodium: 140 mmol/L (ref 135–145)

## 2020-11-16 LAB — CBC
HCT: 38.3 % (ref 36.0–46.0)
Hemoglobin: 13 g/dL (ref 12.0–15.0)
MCH: 29.7 pg (ref 26.0–34.0)
MCHC: 33.9 g/dL (ref 30.0–36.0)
MCV: 87.6 fL (ref 80.0–100.0)
Platelets: 223 10*3/uL (ref 150–400)
RBC: 4.37 MIL/uL (ref 3.87–5.11)
RDW: 12.9 % (ref 11.5–15.5)
WBC: 10.6 10*3/uL — ABNORMAL HIGH (ref 4.0–10.5)
nRBC: 0 % (ref 0.0–0.2)

## 2020-11-16 LAB — MAGNESIUM: Magnesium: 2.4 mg/dL (ref 1.7–2.4)

## 2020-11-16 LAB — T4, FREE: Free T4: 1.37 ng/dL — ABNORMAL HIGH (ref 0.61–1.12)

## 2020-11-16 LAB — LIPASE, BLOOD: Lipase: 20 U/L (ref 11–51)

## 2020-11-16 MED ORDER — GADOBUTROL 1 MMOL/ML IV SOLN
7.5000 mL | Freq: Once | INTRAVENOUS | Status: AC | PRN
  Administered 2020-11-16: 7.5 mL via INTRAVENOUS

## 2020-11-16 NOTE — Progress Notes (Signed)
Patient arrived in the unit at 2255 pm from [facility], A&Ox4, denied of  pain and distress, situated in a bed comfortably, initiated telemetry monitor, and will continue to monitor closely.

## 2020-11-16 NOTE — Progress Notes (Signed)
EEG complete - results pending 

## 2020-11-16 NOTE — Procedures (Signed)
Patient Name: [name]  MRN: [id number]  Epilepsy Attending: Lora Havens  Referring Physician/Provider: Dr Wynetta Fines Date: 11/16/2020 Duration: 29.07 mins  Patient history: 55yo F presented with seizure like activity. EEG to evaluate for seizure.  Level of alertness: Awake  AEDs during EEG study: LEV  Technical aspects: This EEG study was done with scalp electrodes positioned according to the 10-20 International system of electrode placement. Electrical activity was acquired at a sampling rate of 500Hz  and reviewed with a high frequency filter of 70Hz  and a low frequency filter of 1Hz . EEG data were recorded continuously and digitally stored.   Description: The posterior dominant rhythm consists of 9-10 Hz activity of moderate voltage (25-35 uV) seen predominantly in posterior head regions, symmetric and reactive to eye opening and eye closing. EEG showed continuous left temporal 2-3Hz  delta slowing as well as intermittent generalized 2-3hz  sharply contoured delta slowing. Hyperventilation and photic stimulation were not performed.     ABNORMALITY - Intermittent slow, generalized - Continuous slow, left temporal  region  IMPRESSION: This study is suggestive of cortical dysfunction arising from left temporal region,  likely secondary to underlying structural abnormality/mass, post-ictal state. Additionally there is mild diffuse encephalopathy, nonspecific etiology. No seizures or definite epileptiform discharges were seen throughout the recording.  Kaiel Weide Barbra Sarks

## 2020-11-16 NOTE — Plan of Care (Signed)

## 2020-11-16 NOTE — Progress Notes (Signed)
PROGRESS NOTE  [name]:[id number] DOB: 01/28/1966   PCP: [name], NP  Patient is from: Home  DOA: 11/15/2020 LOS: 1  Chief complaints:  Chief Complaint  Patient presents with   Loss of Consciousness     Brief Narrative / Interim history: 55 year old F with PMH of Bell's palsy and chronic left facial droop, chronic hypokalemia, HTN, hypothyroidism and asthma presenting with new onset headache, possible syncope/seizure, urinary accident, nausea and emesis, and found to have 3.5 x 3.1 x 3.4 cm necrotic left temporal lobe mass with intralesional hemorrhage and surrounding edema and mild regional mass-effect concerning for malignancy.  Neurology, neurosurgery and neuro-oncology consulted.  She was a started on Keppra, Decadron and a statin.  CT chest/abdomen/pelvis showed 1.2 x 1.2 cm pancreatic head/uncinate process lesion, 14 mm lobular soft tissue nodularity in the right breast, multiple heterogeneous thyroid nodules with the largest measuring 16 mm in the isthmus, hepatomegaly and stable 4 mm right-sided pulmonary nodule.  MRI abdomen and thyroid US ordered as recommended by radiology.  Of note, patient had mammography and biopsy of left breast lesion in 10/2019.  Pathology was benign.  Subjective: Seen and examined earlier this morning.  No major events overnight of this morning.  No complaints.  She denies headache, vision change, focal neuro symptoms other than mild residual numbness at the tip of a lower lip.  She denies chest pain, dyspnea, GI or UTI symptoms.  Reports normal colonoscopy in 2020.  Denies fever, night sweats or unintentional weight loss.  Objective: Vitals:   11/15/20 2301 11/16/20 0401 11/16/20 0851 11/16/20 1241  BP: 138/87 120/69 121/79 118/84  Pulse: 82 67 70 79  Resp: 18 17 20 18   Temp: 97.7 F (36.5 C) 98.1 F (36.7 C) 97.7 F (36.5 C) 97.6 F (36.4 C)  TempSrc: Oral Oral Oral Oral  SpO2: 98% 98% 98% 98%  Weight:      Height:         Intake/Output Summary (Last 24 hours) at 11/16/2020 1433 Last data filed at 11/16/2020 0607 Gross per 24 hour  Intake 330 ml  Output --  Net 330 ml   Filed Weights   11/15/20 0934  Weight: 77.1 kg    Examination:  GENERAL: No apparent distress.  Nontoxic. HEENT: MMM.  Vision and hearing grossly intact.  NECK: Supple.  No apparent JVD.  RESP: On RA.  No IWOB.  Fair aeration bilaterally. CVS:  RRR. Heart sounds normal.  ABD/GI/GU: BS+. Abd soft, NTND.  MSK/EXT:  Moves extremities. No apparent deformity. No edema.  SKIN: no apparent skin lesion or wound NEURO: Awake, alert and oriented appropriately.  PERRL.  Left facial droop (chronic).  No apparent focal neuro deficit. PSYCH: Calm. Normal affect.   Procedures:  None  Microbiology summarized: [id number]-02 and influenza PCR nonreactive.  Assessment & Plan: Left temporal mass: A 3.5 x 3.1 x 3.4 cm necrotic left temporal mass with concern for intralesional hemorrhage and surrounding edema and mild regional mass on MRI.  Concern for primary glioma or metastatic lesion.  CT abdomen and pelvis revealed pancreatic and thyroid nodules as well as 14 mm lobular soft tissue nodularity in the right breast and stable 4 mm right-sided pulmonary nodule.  HIV nonreactive. -Neurosurgery and neuro-oncology consulted by admitting provider. -Continue Decadron and Keppra -MRI abdomen with or without contrast to evaluate pancreatic lesion -Thyroid ultrasound to evaluate thyroid nodule -Follow further recommendation by NS and neuro-oncology  Seizure-like activity-presented with headache, mental status change, urinary accident, nausea  and emesis.  Could be due to the above.  Migraine headache is a possibility as well.  Symptoms resolved. -Continue Keppra and as needed Ativan -Continue seizure precautions -Follow EEG   Acute on chronic hypokalemia: Likely due to thiazide diuretics.  Resolved. -Continue monitoring  Essential hypertension:  Normotensive. -Continue losartan and HCTZ  Hypothyroidism: TSH within normal.  Free T4 1.37 -Continue home Synthroid.  History of asthma: Stable. -Continue breathing treatments  Bell's palsy with left facial droop: Stable  Body mass index is 25.85 kg/m.         DVT prophylaxis:  SCDs Start: 11/15/20 1536  Code Status: Full code Family Communication: Updated patient's brother over the phone from bedside with patient's permission Level of care: Progressive Status is: Inpatient  Remains inpatient appropriate because:Ongoing diagnostic testing needed not appropriate for outpatient work up, Unsafe d/c plan, IV treatments appropriate due to intensity of illness or inability to take PO, and Inpatient level of care appropriate due to severity of illness  Dispo: The patient is from: Home              Anticipated d/c is to:  To be determined              Patient currently is not medically stable to d/c.   Difficult to place patient No       Consultants:  Neurosurgery Neuro oncology Neurology   Sch Meds:  Scheduled Meds:  atorvastatin  20 mg Oral Daily   dexamethasone (DECADRON) injection  10 mg Intravenous Q6H   hydrochlorothiazide  25 mg Oral Daily   levothyroxine  100 mcg Oral Daily   losartan  100 mg Oral Daily   potassium chloride  40 mEq Oral BID   Continuous Infusions:  sodium chloride 75 mL/hr (11/15/20 1804)   levETIRAcetam 500 mg (11/16/20 0436)   PRN Meds:.acetaminophen, albuterol, LORazepam, ondansetron **OR** ondansetron (ZOFRAN) IV  Antimicrobials: Anti-infectives (From admission, onward)    None        I have personally reviewed the following labs and images: CBC: Recent Labs  Lab 11/15/20 0938 11/16/20 0802  WBC 10.6* 10.6*  HGB 14.0 13.0  HCT 42.0 38.3  MCV 89.6 87.6  PLT 255 223   BMP &GFR Recent Labs  Lab 11/15/20 0938 11/15/20 1205 11/15/20 1814 11/16/20 0802  NA 138  --   --  140  K 2.7*  --  3.1* 4.0  CL 101  --   --   108  CO2 24  --   --  23  GLUCOSE 117*  --   --  134*  BUN 9  --   --  10  CREATININE 1.11*  --   --  1.02*  CALCIUM 9.6  --   --  9.0  MG  --  2.3  --  2.4  PHOS  --   --  3.2 4.2   Estimated Creatinine Clearance: 69.7 mL/min (A) (by C-G formula based on SCr of 1.02 mg/dL (H)). Liver & Pancreas: Recent Labs  Lab 11/15/20 1205 11/16/20 0802  AST 52*  --   ALT 25  --   ALKPHOS 87  --   BILITOT 1.7*  --   PROT 7.4  --   ALBUMIN 4.2 3.4*   Recent Labs  Lab 11/16/20 0802  LIPASE 20   No results for input(s): AMMONIA in the last 168 hours. Diabetic: No results for input(s): HGBA1C in the last 72 hours. Recent Labs  Lab 11/15/20 [place]  121*   Cardiac Enzymes: No results for input(s): CKTOTAL, CKMB, CKMBINDEX, TROPONINI in the last 168 hours. No results for input(s): PROBNP in the last 8760 hours. Coagulation Profile: No results for input(s): INR, PROTIME in the last 168 hours. Thyroid Function Tests: Recent Labs    11/16/20 0802  FREET4 1.37*   Lipid Profile: No results for input(s): CHOL, HDL, LDLCALC, TRIG, CHOLHDL, LDLDIRECT in the last 72 hours. Anemia Panel: No results for input(s): VITAMINB12, FOLATE, FERRITIN, TIBC, IRON, RETICCTPCT in the last 72 hours. Urine analysis:    Component Value Date/Time   COLORURINE YELLOW 11/15/2020 0938   APPEARANCEUR CLOUDY (A) 11/15/2020 0938   LABSPEC 1.018 11/15/2020 0938   PHURINE 5.0 11/15/2020 0938   GLUCOSEU NEGATIVE 11/15/2020 0938   HGBUR MODERATE (A) 11/15/2020 0938   HGBUR negative 01/26/2010 1528   BILIRUBINUR NEGATIVE 11/15/2020 0938   BILIRUBINUR n 05/23/2011 1649   KETONESUR NEGATIVE 11/15/2020 0938   PROTEINUR NEGATIVE 11/15/2020 0938   UROBILINOGEN 1.0 06/22/2012 1540   NITRITE NEGATIVE 11/15/2020 0938   LEUKOCYTESUR NEGATIVE 11/15/2020 0938   Sepsis Labs: Invalid input(s): PROCALCITONIN, [place]  Microbiology: Recent Results (from the past 240 hour(s))  Resp Panel by RT-PCR (Flu A&B,  Covid) Nasopharyngeal Swab     Status: None   Collection Time: 11/15/20 12:05 PM   Specimen: Nasopharyngeal Swab; Nasopharyngeal(NP) swabs in vial transport medium  Result Value Ref Range Status   SARS Coronavirus 2 by RT PCR NEGATIVE NEGATIVE Final    Comment: (NOTE) SARS-CoV-2 target nucleic acids are NOT DETECTED.  The SARS-CoV-2 RNA is generally detectable in upper respiratory specimens during the acute phase of infection. The lowest concentration of SARS-CoV-2 viral copies this assay can detect is 138 copies/mL. A negative result does not preclude SARS-Cov-2 infection and should not be used as the sole basis for treatment or other patient management decisions. A negative result may occur with  improper specimen collection/handling, submission of specimen other than nasopharyngeal swab, presence of viral mutation(s) within the areas targeted by this assay, and inadequate number of viral copies(<138 copies/mL). A negative result must be combined with clinical observations, patient history, and epidemiological information. The expected result is Negative.  Fact Sheet for Patients:  EntrepreneurPulse.com.au  Fact Sheet for Healthcare Providers:  IncredibleEmployment.be  This test is no t yet approved or cleared by the [place] FDA and  has been authorized for detection and/or diagnosis of SARS-CoV-2 by FDA under an Emergency Use Authorization (EUA). This EUA will remain  in effect (meaning this test can be used) for the duration of the COVID-19 declaration under Section 564(b)(1) of the Act, 21 U.S.C.section 360bbb-3(b)(1), unless the authorization is terminated  or revoked sooner.       Influenza A by PCR NEGATIVE NEGATIVE Final   Influenza B by PCR NEGATIVE NEGATIVE Final    Comment: (NOTE) The Xpert Xpress SARS-CoV-2/FLU/RSV plus assay is intended as an aid in the diagnosis of influenza from Nasopharyngeal swab specimens and should  not be used as a sole basis for treatment. Nasal washings and aspirates are unacceptable for Xpert Xpress SARS-CoV-2/FLU/RSV testing.  Fact Sheet for Patients: EntrepreneurPulse.com.au  Fact Sheet for Healthcare Providers: IncredibleEmployment.be  This test is not yet approved or cleared by the [place] FDA and has been authorized for detection and/or diagnosis of SARS-CoV-2 by FDA under an Emergency Use Authorization (EUA). This EUA will remain in effect (meaning this test can be used) for the duration of the COVID-19 declaration under Section 564(b)(1) of the  Act, 21 U.S.C. section 360bbb-3(b)(1), unless the authorization is terminated or revoked.  Performed at [facility], [place] [address] [place], [place] [address]     Radiology Studies: CT Head Wo Contrast  Result Date: 11/15/2020 CLINICAL DATA:  Possible seizure or syncope EXAM: CT HEAD WITHOUT CONTRAST TECHNIQUE: Contiguous axial images were obtained from the base of the skull through the vertex without intravenous contrast. COMPARISON:  None. FINDINGS: Brain: There is no acute intracranial hemorrhage. Low attenuation is present in the left temporal lobe extending posterosuperiorly into the deep white matter tracks there is mild regional mass effect. No substantial midline shift. No hydrocephalus. Gray-white differentiation is otherwise preserved. Vascular: No hyperdense vessel or unexpected calcification. Skull: Calvarium is unremarkable. Sinuses/Orbits: No acute finding. Other: None. IMPRESSION: Edema centered within the left temporal lobe suspicious for underlying lesion. Contrast enhanced MRI recommended for further evaluation. Electronically Signed   By: Macy Mis M.D.   On: 11/15/2020 15:07   MR BRAIN W WO CONTRAST  Result Date: 11/15/2020 CLINICAL DATA:  Abnormal CT EXAM: MRI HEAD WITHOUT AND WITH CONTRAST TECHNIQUE: Multiplanar, multiecho pulse sequences of the brain and  surrounding structures were obtained without and with intravenous contrast. CONTRAST:  4mL GADAVIST GADOBUTROL 1 MMOL/ML IV SOLN COMPARISON:  None. FINDINGS: Brain: There is a heterogeneously enhancing, likely centrally necrotic lesion of the anterior left temporal lobe measuring approximately 3.5 x 3.1 x 3.4 cm. Associated susceptibility likely reflects intralesional hemorrhage. There is surrounding T2 FLAIR hyperintensity extending posteriorly and superiorly about the insula and to the periatrial white matter. There is some mildly reduced diffusion associated with the superior and posterior portion of this T2 hyperintensity. Additional nonspecific patchy foci of T2 hyperintensity in the supratentorial white matter may reflect gliosis or demyelination. Mass effect related to above is mild. No evidence of acute infarction. No hydrocephalus or extra-axial collection. Vascular: Major vessel flow voids at the skull base are preserved. Skull and upper cervical spine: Normal marrow signal is preserved. Sinuses/Orbits: Minor mucosal thickening.  Orbits are unremarkable. Other: Sella is unremarkable.  Mastoid air cells are clear. IMPRESSION: Necrotic left temporal lobe mass with intralesional hemorrhage. Surrounding edema and/or infiltrating tumor. Mild regional mass effect. Differential considerations are metastatic lesion and high-grade glioma. Electronically Signed   By: Macy Mis M.D.   On: 11/15/2020 17:11   CT CHEST ABDOMEN PELVIS W CONTRAST  Result Date: 11/15/2020 CLINICAL DATA:  Cancer of unknown primary. EXAM: CT CHEST, ABDOMEN, AND PELVIS WITH CONTRAST TECHNIQUE: Multidetector CT imaging of the chest, abdomen and pelvis was performed following the standard protocol during bolus administration of intravenous contrast. CONTRAST:  124mL OMNIPAQUE IOHEXOL 300 MG/ML  SOLN COMPARISON:  Chest CT November 15, 2020 CT abdomen June 22, 2012 FINDINGS: CT CHEST FINDINGS Cardiovascular: No thoracic aortic aneurysm.  Normal caliber central pulmonary arteries. Normal size heart. No significant pericardial effusion/thickening. Mediastinum/Nodes: No pathologically enlarged supraclavicular lymph nodes. Multiple heterogeneous appearing thyroid nodules the largest of which is in the isthmus measuring 16 mm on image 5/3. No pathologically enlarged mediastinal, hilar or axillary lymph nodes. Lungs/Pleura: Stable small right upper lobe pulmonary nodules, measuring up to 4 mm on image 49/4. Stable 4 mm subpleural right lower lobe pulmonary nodule on image 76/4. Stable 4 mm right middle lobe pulmonary nodule image 102/4. No new suspicious pulmonary nodules or masses. Right lower lobe calcified granuloma image 83/4. No pleural effusion or pneumothorax. Mild paraseptal emphysematous change. Stable mild biapical pleuroparenchymal scarring. Bibasilar atelectasis Musculoskeletal: Lobular soft tissue nodularity in  the right breast internal calcification measuring 14 mm on image 39/3. Biopsy clip in the left breast and a left axillary lymph node. No aggressive lytic or blastic lesion of bone. CT ABDOMEN PELVIS FINDINGS Hepatobiliary: Hepatomegaly measuring 20.1 cm in maximum craniocaudal dimension. No suspicious hepatic lesion. Gallbladder surgically absent. No biliary ductal dilation. Pancreas: Hypodense 1.2 x 1.2 cm lesion in the pancreatic head/uncinate process on image 61/3. No pancreatic ductal dilation. Spleen: Within normal limits. Adrenals/Urinary Tract: Adrenal glands are unremarkable. Kidneys are normal, without renal calculi, focal lesion, or hydronephrosis. Bladder is unremarkable. Stomach/Bowel: Is grossly unremarkable for degree of distension. Radiopaque enteric contrast traverses the descending colon. No pathologic dilation of small bowel. Appendix and terminal ileum are grossly unremarkable. No suspicious colonic wall thickening or mass like lesions visualized. Vascular/Lymphatic: Aortic atherosclerosis without aneurysmal  dilation. No pathologically enlarged abdominal or pelvic lymph nodes. Reproductive: Status post hysterectomy. No adnexal masses. Other: No abdominal wall hernia or abnormality. No abdominopelvic ascites. Musculoskeletal: Transitional anatomy with sacralization of L5 on the left. No aggressive lytic or blastic lesion of bone. IMPRESSION: 1. Hypodense 1.2 x 1.2 cm lesion in the pancreatic head/uncinate process, which is nonspecific possibly representing a cystic pancreatic lesion, ceases primary pancreatic neoplasm. Further evaluation with MRI of the abdomen with and without contrast is recommended. 2. Lobular soft tissue nodularity in the right breast measuring 14 mm with an internal focus of calcification. Correlation with mammography is recommended. 3. Multiple heterogeneous appearing thyroid nodules the largest of which is in the isthmus measuring 16 mm. Recommend thyroid US (ref: J Am Coll Radiol. 2015 Feb;12(2): 143-50). 4. Hepatomegaly.  No focal hepatic lesion identified. 5. Small right-sided pulmonary nodules, measuring up to 4 mm, stable since 2018 and considered benign. 6. Aortic Atherosclerosis (ICD10-I70.0) and Emphysema (ICD10-J43.9). Electronically Signed   By: Dahlia Bailiff MD   On: 11/15/2020 23:41   CT CHEST ABDOMEN PELVIS WO CONTRAST  Result Date: 11/15/2020 CLINICAL DATA:  Cancer of unknown primary. EXAM: CT CHEST, ABDOMEN AND PELVIS WITHOUT CONTRAST TECHNIQUE: Multidetector CT imaging of the chest, abdomen and pelvis was performed following the standard protocol without IV contrast. COMPARISON:  November 23, 2019 and June 22, 2012 FINDINGS: CT CHEST FINDINGS Cardiovascular: No thoracic aortic aneurysm. Normal caliber central pulmonary arteries. Normal size heart. No significant pericardial effusion/thickening. Mediastinum/Nodes: No pathologically enlarged supraclavicular nodes. Apparent nodular enlargement the thyroid without discrete nodularity. No pathologically enlarged mediastinal, hilar  or axillary lymph nodes, noting limited sensitivity for the detection of hilar adenopathy on this noncontrast study. The trachea esophagus are grossly unremarkable. Lungs/Pleura: Stable small right upper lobe pulmonary nodules, measuring up to 4 mm on image 48/4. Stable 4 mm subpleural right lower lobe pulmonary nodule on image 75/4. Stable 4 mm right middle lobe pulmonary nodule image 104/4. No new suspicious pulmonary nodules or masses. Right lower lobe calcified granuloma image 78/4. No pleural effusion or pneumothorax. Mild paraseptal emphysematous change. Stable mild biapical pleuroparenchymal scarring. Bibasilar atelectasis. Musculoskeletal: No chest wall mass or suspicious bone lesions identified. CT ABDOMEN PELVIS FINDINGS Hepatobiliary: Hepatomegaly measuring 20.2 cm in maximum craniocaudal dimension. No discrete hepatic lesion visualized. Gallbladder is surgically absent. No biliary ductal dilation. Pancreas: Hypodense 1.6 x 1.5 cm area in the pancreatic head/uncinate process on image 60/3. No pancreatic ductal dilation. Spleen: Within normal limits. Adrenals/Urinary Tract: Adrenal glands are unremarkable. Kidneys are normal, without renal calculi, contour deforming lesion, or hydronephrosis. Bladder is unremarkable. Stomach/Bowel: Radiopaque enteric contrast traverses the descending colon. Stomach is grossly unremarkable for degree  of distension. Pathologic dilation of small. The appendix and terminal are grossly. Portions of the sigmoid colon and rectum decompressed limiting evaluation. Vascular/Lymphatic: Aortic atherosclerosis without aneurysmal dilation. No pathologically enlarged abdominal or pelvic lymph nodes. Reproductive: Status post hysterectomy. No adnexal masses. Other: Abdominopelvic ascites. Musculoskeletal: Transitional anatomy with sacralization of the left L5 vertebral body. No aggressive lytic or blastic lesion of bone. IMPRESSION: 1. Hypodense 1.6 x 1.5 cm area in the pancreatic  head/uncinate process, incompletely evaluated without intravenous contrast material and possibly represent fatty infiltration, cystic pancreatic lesion, pseudocyst or primary pancreatic neoplasm. No pancreatic ductal dilation. Further evaluation with MRI of the abdomen with and without contrast is suggested. 2. Apparent nodular enlargement of the thyroid, without discrete thyroid nodule visualized. Nonspecific and possibly within normal limits. However, this could be further evaluated dedicated thyroid ultrasound. 3. Hepatomegaly, of indeterminate clinical significance. No discrete focal lesion visualized on this noncontrast examination. 4. Small pulmonary nodules stable since 2018, favor benign. 5. Aortic Atherosclerosis (ICD10-I70.0) and Emphysema (ICD10-J43.9). Electronically Signed   By: Dahlia Bailiff MD   On: 11/15/2020 23:19       Kimbria Camposano T. [place]  If 7PM-7AM, please contact night-coverage [email or web address] 11/16/2020, 2:33 PM

## 2020-11-17 DIAGNOSIS — K869 Disease of pancreas, unspecified: Secondary | ICD-10-CM | POA: Diagnosis not present

## 2020-11-17 DIAGNOSIS — E876 Hypokalemia: Secondary | ICD-10-CM | POA: Diagnosis not present

## 2020-11-17 DIAGNOSIS — E041 Nontoxic single thyroid nodule: Secondary | ICD-10-CM | POA: Diagnosis not present

## 2020-11-17 DIAGNOSIS — I10 Essential (primary) hypertension: Secondary | ICD-10-CM | POA: Diagnosis not present

## 2020-11-17 DIAGNOSIS — R7401 Elevation of levels of liver transaminase levels: Secondary | ICD-10-CM

## 2020-11-17 LAB — CBC WITH DIFFERENTIAL/PLATELET
Abs Immature Granulocytes: 0.12 10*3/uL — ABNORMAL HIGH (ref 0.00–0.07)
Basophils Absolute: 0 10*3/uL (ref 0.0–0.1)
Basophils Relative: 0 %
Eosinophils Absolute: 0 10*3/uL (ref 0.0–0.5)
Eosinophils Relative: 0 %
HCT: 35 % — ABNORMAL LOW (ref 36.0–46.0)
Hemoglobin: 11.6 g/dL — ABNORMAL LOW (ref 12.0–15.0)
Immature Granulocytes: 1 %
Lymphocytes Relative: 9 %
Lymphs Abs: 1.2 10*3/uL (ref 0.7–4.0)
MCH: 29.6 pg (ref 26.0–34.0)
MCHC: 33.1 g/dL (ref 30.0–36.0)
MCV: 89.3 fL (ref 80.0–100.0)
Monocytes Absolute: 0.5 10*3/uL (ref 0.1–1.0)
Monocytes Relative: 3 %
Neutro Abs: 11.8 10*3/uL — ABNORMAL HIGH (ref 1.7–7.7)
Neutrophils Relative %: 87 %
Platelets: 227 10*3/uL (ref 150–400)
RBC: 3.92 MIL/uL (ref 3.87–5.11)
RDW: 13.2 % (ref 11.5–15.5)
WBC: 13.6 10*3/uL — ABNORMAL HIGH (ref 4.0–10.5)
nRBC: 0 % (ref 0.0–0.2)

## 2020-11-17 LAB — COMPREHENSIVE METABOLIC PANEL
ALT: 33 U/L (ref 0–44)
AST: 73 U/L — ABNORMAL HIGH (ref 15–41)
Albumin: 3.5 g/dL (ref 3.5–5.0)
Alkaline Phosphatase: 75 U/L (ref 38–126)
Anion gap: 8 (ref 5–15)
BUN: 20 mg/dL (ref 6–20)
CO2: 20 mmol/L — ABNORMAL LOW (ref 22–32)
Calcium: 9.3 mg/dL (ref 8.9–10.3)
Chloride: 109 mmol/L (ref 98–111)
Creatinine, Ser: 1.09 mg/dL — ABNORMAL HIGH (ref 0.44–1.00)
GFR, Estimated: >60 mL/min (ref >60)
Glucose, Bld: 109 mg/dL — ABNORMAL HIGH (ref 70–99)
Potassium: 4.1 mmol/L (ref 3.5–5.1)
Sodium: 137 mmol/L (ref 135–145)
Total Bilirubin: 1.5 mg/dL — ABNORMAL HIGH (ref 0.3–1.2)
Total Protein: 6.3 g/dL — ABNORMAL LOW (ref 6.5–8.1)

## 2020-11-17 LAB — CANCER ANTIGEN 19-9: CA 19-9: 6 U/mL (ref 0–35)

## 2020-11-17 LAB — PHOSPHORUS: Phosphorus: 3.4 mg/dL (ref 2.5–4.6)

## 2020-11-17 LAB — MAGNESIUM: Magnesium: 2.5 mg/dL — ABNORMAL HIGH (ref 1.7–2.4)

## 2020-11-17 NOTE — Evaluation (Signed)
Physical Therapy Evaluation & Discharge Patient Details Name: [name] MRN: [id number] DOB: 11/06/1965 Today's Date: 11/17/2020   History of Present Illness  55 y/o female presented to ED on 7/13 with complaints of episode of loss of consciousness with incontinence. 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 Plan for tumor resection on 7/19 by neurosurgery. MRI abdomen show subtle lesion in inferior aspect of pancreatic head. PMH: Bell's palsy with chronic L facial droop, HTN, chronic hypokalemia, hypothyroidism, asthma.  Clinical Impression  PTA, patient lives with daughter and reports independence. Patient currently functioning at modI level for mobility with no AD, no apparent balance noted but patient seemed hesitant at first during mobility but improved with increasing distance. Encouraged ambulation in hallway for improved activity tolerance, discussed with RN and in agreement. No further skilled PT needs required acutely. No PT follow up recommended at this time.     Follow Up Recommendations No PT follow up    Equipment Recommendations  None recommended by PT    Recommendations for Other Services       Precautions / Restrictions Precautions Precautions: None Restrictions Weight Bearing Restrictions: No      Mobility  Bed Mobility Overal bed mobility: Modified Independent                  Transfers Overall transfer level: Modified independent Equipment used: None                Ambulation/Gait Ambulation/Gait assistance: Modified independent (Device/Increase time) Gait Distance (Feet): 250 Feet Assistive device: None Gait Pattern/deviations: WFL(Within Functional Limits)   Gait velocity interpretation: >2.62 ft/sec, indicative of community ambulatory General Gait Details: steady gait speed  Stairs Stairs: Yes Stairs assistance: Modified independent (Device/Increase time) Stair Management:  Two rails;Alternating pattern;Forwards Number of Stairs: 3    Wheelchair Mobility    Modified Rankin (Stroke Patients Only)       Balance Overall balance assessment: No apparent balance deficits (not formally assessed)                                           Pertinent Vitals/Pain Pain Assessment: No/denies pain    Home Living Family/patient expects to be discharged to:: Private residence Living Arrangements: Children Available Help at Discharge: Family;Available 24 hours/day Type of Home: House Home Access: Stairs to enter Entrance Stairs-Rails: Right;Left;Can reach both Entrance Stairs-Number of Steps: 3 Home Layout: One level Home Equipment: None      Prior Function Level of Independence: Independent         Comments: working, driving     Journalist, newspaper        Extremity/Trunk Assessment   Upper Extremity Assessment Upper Extremity Assessment: Overall WFL for tasks assessed    Lower Extremity Assessment Lower Extremity Assessment: Overall WFL for tasks assessed    Cervical / Trunk Assessment Cervical / Trunk Assessment: Normal  Communication   Communication: No difficulties  Cognition Arousal/Alertness: Awake/alert Behavior During Therapy: WFL for tasks assessed/performed Overall Cognitive Status: Within Functional Limits for tasks assessed                                        General Comments      Exercises     Assessment/Plan    PT  Assessment Patent does not need any further PT services  PT Problem List         PT Treatment Interventions      PT Goals (Current goals can be found in the Care Plan section)  Acute Rehab PT Goals Patient Stated Goal: to walk and get better PT Goal Formulation: All assessment and education complete, DC therapy    Frequency     Barriers to discharge        Co-evaluation               AM-PAC PT "6 Clicks" Mobility  Outcome Measure Help needed turning  from your back to your side while in a flat bed without using bedrails?: None Help needed moving from lying on your back to sitting on the side of a flat bed without using bedrails?: None Help needed moving to and from a bed to a chair (including a wheelchair)?: None Help needed standing up from a chair using your arms (e.g., wheelchair or bedside chair)?: None Help needed to walk in hospital room?: None Help needed climbing 3-5 steps with a railing? : None 6 Click Score: 24    End of Session   Activity Tolerance: Patient tolerated treatment well Patient left: in bed;with call bell/phone within reach;with family/visitor present Nurse Communication: Mobility status PT Visit Diagnosis: Muscle weakness (generalized) (M62.81)    Time: 9872-1587 PT Time Calculation (min) (ACUTE ONLY): 19 min   Charges:   PT Evaluation $PT Eval Low Complexity: 1 Low          [name] A. Gilford Rile PT, DPT Acute Rehabilitation Services Pager [phone] Office [phone]   [name] 11/17/2020, 5:23 PM

## 2020-11-17 NOTE — Progress Notes (Signed)
PROGRESS NOTE  [name] KKX:[id number] DOB: 20-Jan-1966   PCP: [name], NP  Patient is from: Home  DOA: 11/15/2020 LOS: 2  Chief complaints:  Chief Complaint  Patient presents with   Loss of Consciousness     Brief Narrative / Interim history: 55 year old F with PMH of Bell's palsy and chronic left facial droop, chronic hypokalemia, HTN, hypothyroidism and asthma presenting with new onset headache, possible syncope/seizure, urinary accident, nausea and emesis, and found to have 3.5 x 3.1 x 3.4 cm necrotic left temporal lobe mass with intralesional hemorrhage and surrounding edema and mild regional mass-effect concerning for malignancy.  Neurology, neurosurgery and neuro-oncology consulted.  She was a started on Keppra, Decadron and a statin.  CT chest/abdomen/pelvis showed 1.2 x 1.2 cm pancreatic head/uncinate process lesion, 14 mm lobular soft tissue nodularity in the right breast, multiple heterogeneous thyroid nodules with the largest measuring 16 mm in the isthmus, hepatomegaly and stable 4 mm right-sided pulmonary nodule.  MRI abdomen with very subtle lesion in the inferior aspect of the pancreatic head.  [facility], Dr. Carol Ada consulted.  Thyroid ultrasound pending.    Can be discharged on AEDs and Decadron on 7/16 if cleared by GI. Neurosurgery planning surgical resection of the temporal lobe mass on 7/19 either inpatient or outpatient.   Subjective: Seen and examined earlier this morning.  No major events overnight of this morning.  No complaints.  Denies headache, vision change, numbness, tingling, weakness, chest pain, dyspnea, GI or UTI symptoms.  Objective: Vitals:   11/16/20 2108 11/16/20 2339 11/17/20 0816 11/17/20 1200  BP: 128/75 120/75 129/83 132/73  Pulse: 73 79 63 (!) 52  Resp: 16 20 18 18   Temp: 98.8 F (37.1 C) 98 F (36.7 C) 97.7 F (36.5 C) (!) 97.5 F (36.4 C)  TempSrc: Oral Oral Oral Oral  SpO2: 98% 98% 99% 100%  Weight:      Height:         Intake/Output Summary (Last 24 hours) at 11/17/2020 1415 Last data filed at 11/17/2020 0610 Gross per 24 hour  Intake 2306.67 ml  Output 0 ml  Net 2306.67 ml   Filed Weights   11/15/20 0934  Weight: 77.1 kg    Examination:  GENERAL: No apparent distress.  Nontoxic. HEENT: MMM.  Vision and hearing grossly intact.  NECK: Supple.  No apparent JVD.  RESP:  No IWOB.  Fair aeration bilaterally. CVS:  RRR. Heart sounds normal.  ABD/GI/GU: BS+. Abd soft, NTND.  MSK/EXT:  Moves extremities. No apparent deformity. No edema.  SKIN: no apparent skin lesion or wound NEURO: Awake and alert. Oriented appropriately.  No apparent focal neuro deficit other than known left facial droop from Bell's palsy. PSYCH: Calm. Normal affect.   Procedures:  None  Microbiology summarized: [id number] and influenza PCR nonreactive.  Assessment & Plan: Left temporal mass: A 3.5 x 3.1 x 3.4 cm necrotic left temporal mass with concern for intralesional hemorrhage and surrounding edema and mild regional mass on MRI.  Concern for primary glioma or metastatic lesion.  CT abdomen and pelvis revealed pancreatic and thyroid nodules as well as 14 mm lobular soft tissue nodularity in the right breast and stable 4 mm right-sided pulmonary nodule.  HIV nonreactive. -Neurosurgery and neuro-oncology consulted by admitting provider. -Neurosurgery planning resection on 7/19 either inpatient or outpatient -Continue Decadron and Keppra -Thyroid ultrasound to evaluate thyroid nodule -Follow further recommendation by NS and neuro-oncology  Pancreatic lesion: Noted on CT abdomen and pelvis, and MRI  abdomen. -[facility], Dr. Carol Ada consulted.  Thyroid nodule-has history of hypothyroidism.  TSH within normal.  Free T4 slightly elevated. -Follow thyroid ultrasound  Right breast lesion: A 14 mm lobular soft tissue nodularity in the right breast noted on CT chest.  Has previous history of breast lesions.  She had  biopsy on her left breast previously, and pathology was benign. -May need diagnostic mammogram outpatient again.  Seizure-presented with headache, mental status change, urinary accident, nausea and emesis.  EEG suggestive of cortical dysfunction arising from left temporal region but no active seizure or epileptiform discharge. Migraine headache is a possibility as well.  Symptoms resolved. -Continue Keppra and as needed Ativan -Continue seizure precautions -Follow EEG   Acute on chronic hypokalemia: Likely due to thiazide diuretics.  Resolved. -Continue monitoring  Essential hypertension: Normotensive. -Continue losartan and HCTZ  Elevated AST/mild hyperbilirubinemia -Check CK  Hypothyroidism: TSH within normal.  Free T4 1.37 -Continue home Synthroid. -Follow-up thyroid ultrasound  History of asthma: Stable. -Continue breathing treatments  Bell's palsy with left facial droop: Stable  Leukocytosis: Likely demargination from steroid.  Body mass index is 25.85 kg/m.         DVT prophylaxis:  SCDs Start: 11/15/20 1536  Code Status: Full code Family Communication: Updated patient's brother over the phone from bedside with patient's permission Level of care: Progressive Status is: Inpatient  Remains inpatient appropriate because:Ongoing diagnostic testing needed not appropriate for outpatient work up, Unsafe d/c plan, IV treatments appropriate due to intensity of illness or inability to take PO, and Inpatient level of care appropriate due to severity of illness  Dispo: The patient is from: Home              Anticipated d/c is to:  Home              Patient currently is not medically stable to d/c.   Difficult to place patient No       Consultants:  Neurosurgery Neuro oncology Neurology Gastroenterology   Sch Meds:  Scheduled Meds:  atorvastatin  20 mg Oral Daily   dexamethasone (DECADRON) injection  10 mg Intravenous Q6H   hydrochlorothiazide  25 mg Oral  Daily   levothyroxine  100 mcg Oral Daily   losartan  100 mg Oral Daily   Continuous Infusions:  sodium chloride 75 mL/hr (11/17/20 1141)   levETIRAcetam 500 mg (11/17/20 0317)   PRN Meds:.acetaminophen, albuterol, LORazepam, ondansetron **OR** ondansetron (ZOFRAN) IV  Antimicrobials: Anti-infectives (From admission, onward)    None        I have personally reviewed the following labs and images: CBC: Recent Labs  Lab 11/15/20 0938 11/16/20 0802 11/17/20 0146  WBC 10.6* 10.6* 13.6*  NEUTROABS  --   --  11.8*  HGB 14.0 13.0 11.6*  HCT 42.0 38.3 35.0*  MCV 89.6 87.6 89.3  PLT 255 223 227   BMP &GFR Recent Labs  Lab 11/15/20 0938 11/15/20 1205 11/15/20 1814 11/16/20 0802 11/17/20 0146  NA 138  --   --  140 137  K 2.7*  --  3.1* 4.0 4.1  CL 101  --   --  108 109  CO2 24  --   --  23 20*  GLUCOSE 117*  --   --  134* 109*  BUN 9  --   --  10 20  CREATININE 1.11*  --   --  1.02* 1.09*  CALCIUM 9.6  --   --  9.0 9.3  MG  --  2.3  --  2.4 2.5*  PHOS  --   --  3.2 4.2 3.4   Estimated Creatinine Clearance: 65.2 mL/min (A) (by C-G formula based on SCr of 1.09 mg/dL (H)). Liver & Pancreas: Recent Labs  Lab 11/15/20 1205 11/16/20 0802 11/17/20 0146  AST 52*  --  73*  ALT 25  --  33  ALKPHOS 87  --  75  BILITOT 1.7*  --  1.5*  PROT 7.4  --  6.3*  ALBUMIN 4.2 3.4* 3.5   Recent Labs  Lab 11/16/20 0802  LIPASE 20   No results for input(s): AMMONIA in the last 168 hours. Diabetic: No results for input(s): HGBA1C in the last 72 hours. Recent Labs  Lab 11/15/20 0942  GLUCAP 121*   Cardiac Enzymes: No results for input(s): CKTOTAL, CKMB, CKMBINDEX, TROPONINI in the last 168 hours. No results for input(s): PROBNP in the last 8760 hours. Coagulation Profile: No results for input(s): INR, PROTIME in the last 168 hours. Thyroid Function Tests: Recent Labs    11/16/20 0802  FREET4 1.37*   Lipid Profile: No results for input(s): CHOL, HDL, LDLCALC, TRIG,  CHOLHDL, LDLDIRECT in the last 72 hours. Anemia Panel: No results for input(s): VITAMINB12, FOLATE, FERRITIN, TIBC, IRON, RETICCTPCT in the last 72 hours. Urine analysis:    Component Value Date/Time   COLORURINE YELLOW 11/15/2020 0938   APPEARANCEUR CLOUDY (A) 11/15/2020 0938   LABSPEC 1.018 11/15/2020 0938   PHURINE 5.0 11/15/2020 0938   GLUCOSEU NEGATIVE 11/15/2020 0938   HGBUR MODERATE (A) 11/15/2020 0938   HGBUR negative 01/26/2010 1528   BILIRUBINUR NEGATIVE 11/15/2020 0938   BILIRUBINUR n 05/23/2011 1649   KETONESUR NEGATIVE 11/15/2020 0938   PROTEINUR NEGATIVE 11/15/2020 0938   UROBILINOGEN 1.0 06/22/2012 1540   NITRITE NEGATIVE 11/15/2020 0938   LEUKOCYTESUR NEGATIVE 11/15/2020 0938   Sepsis Labs: Invalid input(s): PROCALCITONIN, [place]  Microbiology: Recent Results (from the past 240 hour(s))  Resp Panel by RT-PCR (Flu A&B, Covid) Nasopharyngeal Swab     Status: None   Collection Time: 11/15/20 12:05 PM   Specimen: Nasopharyngeal Swab; Nasopharyngeal(NP) swabs in vial transport medium  Result Value Ref Range Status   SARS Coronavirus 2 by RT PCR NEGATIVE NEGATIVE Final    Comment: (NOTE) SARS-CoV-2 target nucleic acids are NOT DETECTED.  The SARS-CoV-2 RNA is generally detectable in upper respiratory specimens during the acute phase of infection. The lowest concentration of SARS-CoV-2 viral copies this assay can detect is 138 copies/mL. A negative result does not preclude SARS-Cov-2 infection and should not be used as the sole basis for treatment or other patient management decisions. A negative result may occur with  improper specimen collection/handling, submission of specimen other than nasopharyngeal swab, presence of viral mutation(s) within the areas targeted by this assay, and inadequate number of viral copies(<138 copies/mL). A negative result must be combined with clinical observations, patient history, and epidemiological information. The expected  result is Negative.  Fact Sheet for Patients:  EntrepreneurPulse.com.au  Fact Sheet for Healthcare Providers:  IncredibleEmployment.be  This test is no t yet approved or cleared by the [place] FDA and  has been authorized for detection and/or diagnosis of SARS-CoV-2 by FDA under an Emergency Use Authorization (EUA). This EUA will remain  in effect (meaning this test can be used) for the duration of the COVID-19 declaration under Section 564(b)(1) of the Act, 21 U.S.C.section 360bbb-3(b)(1), unless the authorization is terminated  or revoked sooner.       Influenza A  by PCR NEGATIVE NEGATIVE Final   Influenza B by PCR NEGATIVE NEGATIVE Final    Comment: (NOTE) The Xpert Xpress SARS-CoV-2/FLU/RSV plus assay is intended as an aid in the diagnosis of influenza from Nasopharyngeal swab specimens and should not be used as a sole basis for treatment. Nasal washings and aspirates are unacceptable for Xpert Xpress SARS-CoV-2/FLU/RSV testing.  Fact Sheet for Patients: EntrepreneurPulse.com.au  Fact Sheet for Healthcare Providers: IncredibleEmployment.be  This test is not yet approved or cleared by the [place] FDA and has been authorized for detection and/or diagnosis of SARS-CoV-2 by FDA under an Emergency Use Authorization (EUA). This EUA will remain in effect (meaning this test can be used) for the duration of the COVID-19 declaration under Section 564(b)(1) of the Act, 21 U.S.C. section 360bbb-3(b)(1), unless the authorization is terminated or revoked.  Performed at [facility], [place] [address] [place], [place] [address]     Radiology Studies: MR ABDOMEN W WO CONTRAST  Result Date: 11/17/2020 CLINICAL DATA:  55 year old female with history of a pancreatic lesion noted on recent CT examination. Follow-up evaluation. EXAM: MRI ABDOMEN WITHOUT AND WITH CONTRAST TECHNIQUE: Multiplanar  multisequence MR imaging of the abdomen was performed both before and after the administration of intravenous contrast. CONTRAST:  7.37mL GADAVIST GADOBUTROL 1 MMOL/ML IV SOLN COMPARISON:  No prior abdominal MRI. CT the chest, abdomen and pelvis 11/15/2020. FINDINGS: Lower chest: Unremarkable. Hepatobiliary: Mild diffuse loss of signal intensity throughout the hepatic parenchyma on out of phase dual echo images, indicative of a background of hepatic steatosis. No suspicious cystic or solid hepatic lesions. No intra or extrahepatic biliary ductal dilatation. Status post cholecystectomy. Pancreas: In the inferior aspect of the pancreatic head (axial image 84 of series 802 and coronal image 89 of series 9) there is a subtle lesion which is T1 isointense, T2 isointense and appears hypovascular on post gadolinium imaging measuring 1.2 x 1.0 x 1.3 cm. This lesion is immediately to the right of the superior mesenteric vein, with an intervening fat plane, but makes contact with the anterior superior pancreaticoduodenal artery. No other pancreatic mass is noted. No peripancreatic fluid collections or inflammatory changes. Spleen:  Unremarkable. Adrenals/Urinary Tract: Bilateral kidneys and adrenal glands are normal in appearance. No hydroureteronephrosis in the visualized portions of the abdomen. Stomach/Bowel: Visualized portions are unremarkable. Vascular/Lymphatic: Aortic atherosclerosis, without evidence of aneurysm in the abdominal vasculature. Vascular findings pertinent to potential pancreatic neoplasm, as detailed above. No lymphadenopathy noted in the abdomen. Other: No significant volume of ascites noted in the visualized portions of the peritoneal cavity. Musculoskeletal: No aggressive appearing osseous lesions are noted in the visualized portions of the skeleton. IMPRESSION: 1. Very subtle lesion in the inferior aspect of the pancreatic head which has unusual imaging characteristics, but concerning for potential  small neoplasm such as a primary pancreatic adenocarcinoma. Further evaluation with endoscopy and endoscopic ultrasound is strongly recommended in the near future to better characterize this finding. 2. No lymphadenopathy or definite signs of metastatic disease noted in the abdomen at this time. 3. Hepatic steatosis. 4. Aortic atherosclerosis. Electronically Signed   By: Vinnie Langton M.D.   On: 11/17/2020 08:13   EEG adult  Result Date: 11/16/2020 Lora Havens, MD     11/16/2020  4:38 PM Patient Name: [name] MRN: [id number] Epilepsy Attending: Lora Havens Referring Physician/Provider: Dr Wynetta Fines Date: 11/16/2020 Duration: 29.07 mins Patient history: 55yo F presented with seizure like activity. EEG to evaluate for seizure. Level of alertness:  Awake AEDs during EEG study: LEV Technical aspects: This EEG study was done with scalp electrodes positioned according to the 10-20 International system of electrode placement. Electrical activity was acquired at a sampling rate of 500Hz  and reviewed with a high frequency filter of 70Hz  and a low frequency filter of 1Hz . EEG data were recorded continuously and digitally stored. Description: The posterior dominant rhythm consists of 9-10 Hz activity of moderate voltage (25-35 uV) seen predominantly in posterior head regions, symmetric and reactive to eye opening and eye closing. EEG showed continuous left temporal 2-3Hz  delta slowing as well as intermittent generalized 2-3hz  sharply contoured delta slowing. Hyperventilation and photic stimulation were not performed.   ABNORMALITY - Intermittent slow, generalized - Continuous slow, left temporal  region IMPRESSION: This study is suggestive of cortical dysfunction arising from left temporal region,  likely secondary to underlying structural abnormality/mass, post-ictal state. Additionally there is mild diffuse encephalopathy, nonspecific etiology. No seizures or definite epileptiform discharges were seen  throughout the recording. Priyanka Barbra Sarks       Kittie Krizan T. [place]  If 7PM-7AM, please contact night-coverage [email or web address] 11/17/2020, 2:15 PM

## 2020-11-17 NOTE — Consult Note (Signed)
Reason for Consult: Pancreatic head lesion, left temporal lobe mass Referring Physician: [place] HPI: This is a 55 year old female with PMH of Bell's palsy, left facial droop, HTN, hypothyroidism, and asthma admitted for syncope versus seizure and urinary incontinence.  Prior to her admission the patient was complaining about feeling unwell since her COVID-19 diagnosis earlier.  On the day of admission the patient woke up at 3 AM with a headache and then she suffered with syncope/seizure.  Upon regaining consciousness she noticed that she was incontinent of urine.  Work up in the ER showed that she was hypokalemic and she ruled out for a cardiac source, however, the CT scan of the brain showed that she had a 3.5 x 3.1 x 3.4 cm necrotic left temporal mass.  There was evidence of intralesional hemorrhage, surrounding edema, and mild regional mass effect.  The findings were very concerning for a malignancy.  A CT scan of the chest/abd/pelvis showed a 1.2 x 1.2 cm lesion in the head of the pancreas.  There was a stable 4 mm nodule in the right lower lobe lung field, a 14 mm breast lesions, and multiple nodules in the thyroid.  An MRI of the abdomen confirmed the findings in the pancreatic head/uncinate process.  Neurosurgery plans to operate on Tuesday and she was started in [place] and [place]. Past Medical History:  Diagnosis Date   Bell's palsy    with res left lag   Essential hypertension    External hemorrhoids    History of pneumonia    Hyperlipidemia    Hyperthyroidism    hx 55 years old- oral meds   Iron deficiency anemia    Migraine headache    PPD positive    Pulmonary nodules    not seen on plain cxr, first detected 1/07 re ct 10/08 pos IPPD > 15 mm 12/09 FOB/lavage 04/20/08 neg afb smear   PVC's (premature ventricular contractions)     Past Surgical History:  Procedure Laterality Date   ABDOMINAL HYSTERECTOMY     BREAST BIOPSY Right    CHOLECYSTECTOMY   2014   COLONOSCOPY  05/04/2009   prolapsing external hemorrhoids   ENDOMETRIAL ABLATION     HEMORRHOID SURGERY     x 3   TUBAL LIGATION      Family History  Problem Relation Age of Onset   Heart disease Maternal Grandmother        aunts, uncles, sister   Diabetes Mellitus II Maternal Grandmother    Kidney failure Sister    Diabetes Mellitus II Sister    Lung cancer Other        aunts and uncles   Colon cancer Maternal Aunt        aunts and uncles   Diabetes Mellitus II Maternal Aunt    Colon cancer Maternal Uncle    Diabetes Mellitus II Maternal Uncle    Diabetes type II Mother     Social History:  reports that she has never smoked. She has never used smokeless tobacco. She reports that she does not drink alcohol and does not use drugs.  Allergies: No Known Allergies  Medications: Scheduled:  atorvastatin  20 mg Oral Daily   dexamethasone (DECADRON) injection  10 mg Intravenous Q6H   hydrochlorothiazide  25 mg Oral Daily   levothyroxine  100 mcg Oral Daily   losartan  100 mg Oral Daily   Continuous:  sodium chloride 75 mL/hr (11/17/20 1141)   levETIRAcetam 500  mg (11/17/20 1646)    Results for orders placed or performed during the hospital encounter of 11/15/20 (from the past 24 hour(s))  Comprehensive metabolic panel     Status: Abnormal   Collection Time: 11/17/20  1:46 AM  Result Value Ref Range   Sodium 137 135 - 145 mmol/L   Potassium 4.1 3.5 - 5.1 mmol/L   Chloride 109 98 - 111 mmol/L   CO2 20 (L) 22 - 32 mmol/L   Glucose, Bld 109 (H) 70 - 99 mg/dL   BUN 20 6 - 20 mg/dL   Creatinine, Ser 1.09 (H) 0.44 - 1.00 mg/dL   Calcium 9.3 8.9 - 10.3 mg/dL   Total Protein 6.3 (L) 6.5 - 8.1 g/dL   Albumin 3.5 3.5 - 5.0 g/dL   AST 73 (H) 15 - 41 U/L   ALT 33 0 - 44 U/L   Alkaline Phosphatase 75 38 - 126 U/L   Total Bilirubin 1.5 (H) 0.3 - 1.2 mg/dL   GFR, Estimated >60 >60 mL/min   Anion gap 8 5 - 15  Phosphorus     Status: None   Collection Time: 11/17/20  1:46  AM  Result Value Ref Range   Phosphorus 3.4 2.5 - 4.6 mg/dL  Magnesium     Status: Abnormal   Collection Time: 11/17/20  1:46 AM  Result Value Ref Range   Magnesium 2.5 (H) 1.7 - 2.4 mg/dL  CBC with Differential/Platelet     Status: Abnormal   Collection Time: 11/17/20  1:46 AM  Result Value Ref Range   WBC 13.6 (H) 4.0 - 10.5 K/uL   RBC 3.92 3.87 - 5.11 MIL/uL   Hemoglobin 11.6 (L) 12.0 - 15.0 g/dL   HCT 35.0 (L) 36.0 - 46.0 %   MCV 89.3 80.0 - 100.0 fL   MCH 29.6 26.0 - 34.0 pg   MCHC 33.1 30.0 - 36.0 g/dL   RDW 13.2 11.5 - 15.5 %   Platelets 227 150 - 400 K/uL   nRBC 0.0 0.0 - 0.2 %   Neutrophils Relative % 87 %   Neutro Abs 11.8 (H) 1.7 - 7.7 K/uL   Lymphocytes Relative 9 %   Lymphs Abs 1.2 0.7 - 4.0 K/uL   Monocytes Relative 3 %   Monocytes Absolute 0.5 0.1 - 1.0 K/uL   Eosinophils Relative 0 %   Eosinophils Absolute 0.0 0.0 - 0.5 K/uL   Basophils Relative 0 %   Basophils Absolute 0.0 0.0 - 0.1 K/uL   Immature Granulocytes 1 %   Abs Immature Granulocytes 0.12 (H) 0.00 - 0.07 K/uL     MR ABDOMEN W WO CONTRAST  Result Date: 11/17/2020 CLINICAL DATA:  55 year old female with history of a pancreatic lesion noted on recent CT examination. Follow-up evaluation. EXAM: MRI ABDOMEN WITHOUT AND WITH CONTRAST TECHNIQUE: Multiplanar multisequence MR imaging of the abdomen was performed both before and after the administration of intravenous contrast. CONTRAST:  7.96mL GADAVIST GADOBUTROL 1 MMOL/ML IV SOLN COMPARISON:  No prior abdominal MRI. CT the chest, abdomen and pelvis 11/15/2020. FINDINGS: Lower chest: Unremarkable. Hepatobiliary: Mild diffuse loss of signal intensity throughout the hepatic parenchyma on out of phase dual echo images, indicative of a background of hepatic steatosis. No suspicious cystic or solid hepatic lesions. No intra or extrahepatic biliary ductal dilatation. Status post cholecystectomy. Pancreas: In the inferior aspect of the pancreatic head (axial image 84 of  series 802 and coronal image 89 of series 9) there is a subtle lesion which is T1  isointense, T2 isointense and appears hypovascular on post gadolinium imaging measuring 1.2 x 1.0 x 1.3 cm. This lesion is immediately to the right of the superior mesenteric vein, with an intervening fat plane, but makes contact with the anterior superior pancreaticoduodenal artery. No other pancreatic mass is noted. No peripancreatic fluid collections or inflammatory changes. Spleen:  Unremarkable. Adrenals/Urinary Tract: Bilateral kidneys and adrenal glands are normal in appearance. No hydroureteronephrosis in the visualized portions of the abdomen. Stomach/Bowel: Visualized portions are unremarkable. Vascular/Lymphatic: Aortic atherosclerosis, without evidence of aneurysm in the abdominal vasculature. Vascular findings pertinent to potential pancreatic neoplasm, as detailed above. No lymphadenopathy noted in the abdomen. Other: No significant volume of ascites noted in the visualized portions of the peritoneal cavity. Musculoskeletal: No aggressive appearing osseous lesions are noted in the visualized portions of the skeleton. IMPRESSION: 1. Very subtle lesion in the inferior aspect of the pancreatic head which has unusual imaging characteristics, but concerning for potential small neoplasm such as a primary pancreatic adenocarcinoma. Further evaluation with endoscopy and endoscopic ultrasound is strongly recommended in the near future to better characterize this finding. 2. No lymphadenopathy or definite signs of metastatic disease noted in the abdomen at this time. 3. Hepatic steatosis. 4. Aortic atherosclerosis. Electronically Signed   By: Vinnie Langton M.D.   On: 11/17/2020 08:13   CT CHEST ABDOMEN PELVIS W CONTRAST  Result Date: 11/15/2020 CLINICAL DATA:  Cancer of unknown primary. EXAM: CT CHEST, ABDOMEN, AND PELVIS WITH CONTRAST TECHNIQUE: Multidetector CT imaging of the chest, abdomen and pelvis was performed  following the standard protocol during bolus administration of intravenous contrast. CONTRAST:  164mL OMNIPAQUE IOHEXOL 300 MG/ML  SOLN COMPARISON:  Chest CT November 15, 2020 CT abdomen June 22, 2012 FINDINGS: CT CHEST FINDINGS Cardiovascular: No thoracic aortic aneurysm. Normal caliber central pulmonary arteries. Normal size heart. No significant pericardial effusion/thickening. Mediastinum/Nodes: No pathologically enlarged supraclavicular lymph nodes. Multiple heterogeneous appearing thyroid nodules the largest of which is in the isthmus measuring 16 mm on image 5/3. No pathologically enlarged mediastinal, hilar or axillary lymph nodes. Lungs/Pleura: Stable small right upper lobe pulmonary nodules, measuring up to 4 mm on image 49/4. Stable 4 mm subpleural right lower lobe pulmonary nodule on image 76/4. Stable 4 mm right middle lobe pulmonary nodule image 102/4. No new suspicious pulmonary nodules or masses. Right lower lobe calcified granuloma image 83/4. No pleural effusion or pneumothorax. Mild paraseptal emphysematous change. Stable mild biapical pleuroparenchymal scarring. Bibasilar atelectasis Musculoskeletal: Lobular soft tissue nodularity in the right breast internal calcification measuring 14 mm on image 39/3. Biopsy clip in the left breast and a left axillary lymph node. No aggressive lytic or blastic lesion of bone. CT ABDOMEN PELVIS FINDINGS Hepatobiliary: Hepatomegaly measuring 20.1 cm in maximum craniocaudal dimension. No suspicious hepatic lesion. Gallbladder surgically absent. No biliary ductal dilation. Pancreas: Hypodense 1.2 x 1.2 cm lesion in the pancreatic head/uncinate process on image 61/3. No pancreatic ductal dilation. Spleen: Within normal limits. Adrenals/Urinary Tract: Adrenal glands are unremarkable. Kidneys are normal, without renal calculi, focal lesion, or hydronephrosis. Bladder is unremarkable. Stomach/Bowel: Is grossly unremarkable for degree of distension. Radiopaque enteric  contrast traverses the descending colon. No pathologic dilation of small bowel. Appendix and terminal ileum are grossly unremarkable. No suspicious colonic wall thickening or mass like lesions visualized. Vascular/Lymphatic: Aortic atherosclerosis without aneurysmal dilation. No pathologically enlarged abdominal or pelvic lymph nodes. Reproductive: Status post hysterectomy. No adnexal masses. Other: No abdominal wall hernia or abnormality. No abdominopelvic ascites. Musculoskeletal: Transitional anatomy with sacralization of  L5 on the left. No aggressive lytic or blastic lesion of bone. IMPRESSION: 1. Hypodense 1.2 x 1.2 cm lesion in the pancreatic head/uncinate process, which is nonspecific possibly representing a cystic pancreatic lesion, ceases primary pancreatic neoplasm. Further evaluation with MRI of the abdomen with and without contrast is recommended. 2. Lobular soft tissue nodularity in the right breast measuring 14 mm with an internal focus of calcification. Correlation with mammography is recommended. 3. Multiple heterogeneous appearing thyroid nodules the largest of which is in the isthmus measuring 16 mm. Recommend thyroid US (ref: J Am Coll Radiol. 2015 Feb;12(2): 143-50). 4. Hepatomegaly.  No focal hepatic lesion identified. 5. Small right-sided pulmonary nodules, measuring up to 4 mm, stable since 2018 and considered benign. 6. Aortic Atherosclerosis (ICD10-I70.0) and Emphysema (ICD10-J43.9). Electronically Signed   By: Dahlia Bailiff MD   On: 11/15/2020 23:41   EEG adult  Result Date: 11/16/2020 Lora Havens, MD     11/16/2020  4:38 PM Patient Name: [name] MRN: [id number] Epilepsy Attending: Lora Havens Referring Physician/Provider: Dr Wynetta Fines Date: 11/16/2020 Duration: 29.07 mins Patient history: 55yo F presented with seizure like activity. EEG to evaluate for seizure. Level of alertness: Awake AEDs during EEG study: LEV Technical aspects: This EEG study was done with scalp  electrodes positioned according to the 10-20 International system of electrode placement. Electrical activity was acquired at a sampling rate of 500Hz  and reviewed with a high frequency filter of 70Hz  and a low frequency filter of 1Hz . EEG data were recorded continuously and digitally stored. Description: The posterior dominant rhythm consists of 9-10 Hz activity of moderate voltage (25-35 uV) seen predominantly in posterior head regions, symmetric and reactive to eye opening and eye closing. EEG showed continuous left temporal 2-3Hz  delta slowing as well as intermittent generalized 2-3hz  sharply contoured delta slowing. Hyperventilation and photic stimulation were not performed.   ABNORMALITY - Intermittent slow, generalized - Continuous slow, left temporal  region IMPRESSION: This study is suggestive of cortical dysfunction arising from left temporal region,  likely secondary to underlying structural abnormality/mass, post-ictal state. Additionally there is mild diffuse encephalopathy, nonspecific etiology. No seizures or definite epileptiform discharges were seen throughout the recording. Priyanka Barbra Sarks   US THYROID  Result Date: 11/17/2020 CLINICAL DATA:  Thyroid nodule EXAM: THYROID ULTRASOUND TECHNIQUE: Ultrasound examination of the thyroid gland and adjacent soft tissues was performed. COMPARISON:  None. FINDINGS: Parenchymal Echotexture: Mildly heterogeneous Isthmus: 0.7 cm Right lobe: 4.4 x 1.3 x 1.4 cm Left lobe: 4.4 x 2.0 x 1.2 cm _________________________________________________________ Estimated total number of nodules >/= 1 cm: 1 Number of spongiform nodules >/=  2 cm not described below (TR1): 0 Number of mixed cystic and solid nodules >/= 1.5 cm not described below (TR2): 0 _________________________________________________________ Nodule # 1: Location: Isthmus; superior Maximum size: 1.2 cm; Other 2 dimensions: 1.1 x 0.9 cm Composition: mixed cystic and solid (1) Echogenicity: isoechoic (1)  Shape: not taller-than-wide (0) Margins: smooth (0) Echogenic foci: none (0) ACR TI-RADS total points: 2. ACR TI-RADS risk category: TR2 (2 points). ACR TI-RADS recommendations: This nodule does NOT meet TI-RADS criteria for biopsy or dedicated follow-up. _________________________________________________________ Remaining subcentimeter thyroid nodules do not meet criteria for FNA or surveillance. IMPRESSION: No suspicious thyroid nodules. The above is in keeping with the ACR TI-RADS recommendations - J Am Coll Radiol 2017;14:587-595. Electronically Signed   By: Miachel Roux M.D.   On: 11/17/2020 16:07   CT CHEST ABDOMEN PELVIS WO CONTRAST  Result Date: 11/15/2020  CLINICAL DATA:  Cancer of unknown primary. EXAM: CT CHEST, ABDOMEN AND PELVIS WITHOUT CONTRAST TECHNIQUE: Multidetector CT imaging of the chest, abdomen and pelvis was performed following the standard protocol without IV contrast. COMPARISON:  November 23, 2019 and June 22, 2012 FINDINGS: CT CHEST FINDINGS Cardiovascular: No thoracic aortic aneurysm. Normal caliber central pulmonary arteries. Normal size heart. No significant pericardial effusion/thickening. Mediastinum/Nodes: No pathologically enlarged supraclavicular nodes. Apparent nodular enlargement the thyroid without discrete nodularity. No pathologically enlarged mediastinal, hilar or axillary lymph nodes, noting limited sensitivity for the detection of hilar adenopathy on this noncontrast study. The trachea esophagus are grossly unremarkable. Lungs/Pleura: Stable small right upper lobe pulmonary nodules, measuring up to 4 mm on image 48/4. Stable 4 mm subpleural right lower lobe pulmonary nodule on image 75/4. Stable 4 mm right middle lobe pulmonary nodule image 104/4. No new suspicious pulmonary nodules or masses. Right lower lobe calcified granuloma image 78/4. No pleural effusion or pneumothorax. Mild paraseptal emphysematous change. Stable mild biapical pleuroparenchymal scarring. Bibasilar  atelectasis. Musculoskeletal: No chest wall mass or suspicious bone lesions identified. CT ABDOMEN PELVIS FINDINGS Hepatobiliary: Hepatomegaly measuring 20.2 cm in maximum craniocaudal dimension. No discrete hepatic lesion visualized. Gallbladder is surgically absent. No biliary ductal dilation. Pancreas: Hypodense 1.6 x 1.5 cm area in the pancreatic head/uncinate process on image 60/3. No pancreatic ductal dilation. Spleen: Within normal limits. Adrenals/Urinary Tract: Adrenal glands are unremarkable. Kidneys are normal, without renal calculi, contour deforming lesion, or hydronephrosis. Bladder is unremarkable. Stomach/Bowel: Radiopaque enteric contrast traverses the descending colon. Stomach is grossly unremarkable for degree of distension. Pathologic dilation of small. The appendix and terminal are grossly. Portions of the sigmoid colon and rectum decompressed limiting evaluation. Vascular/Lymphatic: Aortic atherosclerosis without aneurysmal dilation. No pathologically enlarged abdominal or pelvic lymph nodes. Reproductive: Status post hysterectomy. No adnexal masses. Other: Abdominopelvic ascites. Musculoskeletal: Transitional anatomy with sacralization of the left L5 vertebral body. No aggressive lytic or blastic lesion of bone. IMPRESSION: 1. Hypodense 1.6 x 1.5 cm area in the pancreatic head/uncinate process, incompletely evaluated without intravenous contrast material and possibly represent fatty infiltration, cystic pancreatic lesion, pseudocyst or primary pancreatic neoplasm. No pancreatic ductal dilation. Further evaluation with MRI of the abdomen with and without contrast is suggested. 2. Apparent nodular enlargement of the thyroid, without discrete thyroid nodule visualized. Nonspecific and possibly within normal limits. However, this could be further evaluated dedicated thyroid ultrasound. 3. Hepatomegaly, of indeterminate clinical significance. No discrete focal lesion visualized on this noncontrast  examination. 4. Small pulmonary nodules stable since 2018, favor benign. 5. Aortic Atherosclerosis (ICD10-I70.0) and Emphysema (ICD10-J43.9). Electronically Signed   By: Dahlia Bailiff MD   On: 11/15/2020 23:19    ROS:  As stated above in the HPI otherwise negative.  Blood pressure 132/73, pulse (!) 52, temperature (!) 97.5 F (36.4 C), temperature source Oral, resp. rate 18, height 5\' 8"  (1.727 m), weight 77.1 kg, SpO2 100 %.    PE: Gen: NAD, Alert and Oriented HEENT:  [place]/AT, EOMI Neck: Supple, no LAD Lungs: CTA Bilaterally CV: RRR without M/G/R ABD: Soft, NTND, +BS Ext: No C/C/E  Assessment/Plan: 1) Pancreatic lesion. 2) Left temporal lobe region. 3) Breast lesion. 4) Thyroid nodules.   The plan is to operate per Neurosurgery this coming Tuesday.  This will provide the highest yield for pathology.  If it is still required an EUS with FNA can be performed s/p surgery.  Plan: 1) No EUS at this time. 2) Await Neurosurgical intervention.  Stacy Sailer D 11/17/2020, 5:17 PM

## 2020-11-17 NOTE — Progress Notes (Signed)
Neurosurgery Service Progress Note  Subjective: No acute events overnight, taking over for Dr. Saintclair Halsted   Objective: Vitals:   11/16/20 1541 11/16/20 2108 11/16/20 2339 11/17/20 0816  BP: 131/75 128/75 120/75 129/83  Pulse: 70 73 79 63  Resp: 18 16 20 18   Temp: 98.3 F (36.8 C) 98.8 F (37.1 C) 98 F (36.7 C) 97.7 F (36.5 C)  TempSrc: Oral Oral Oral Oral  SpO2: 99% 98% 98% 99%  Weight:      Height:        Physical Exam: AOx3, PERRL, EOMI, L LMN facial weakness HB2, intact on R, FSS, TM, Strength 5/5 x4, SILTx4  Assessment & Plan: 55 y.o. woman w/ new onset likely seizure, MRI w/ 3.5cm L temporal enhancing mass.  -scheduled for OR on Tuesday 7/19, I'm okay with the patient going home this weekend and coming back for surgery but obviously would defer this if other services need further workup -discussed w/ pt and her brother at length, lesion will need surgical resection, we will get pathology from that which can guide further treatment, unclear if it's a primary or secondary tumor but imaging does look somewhat more consistent with a primary brain tumor -defer to neurology but obviously will need to be discharged on AEDs, I discussed with her Sz precautions, importance of taking AEDs as directed  Judith Part  11/17/20 9:33 AM

## 2020-11-18 DIAGNOSIS — E041 Nontoxic single thyroid nodule: Secondary | ICD-10-CM

## 2020-11-18 LAB — COMPREHENSIVE METABOLIC PANEL
ALT: 35 U/L (ref 0–44)
AST: 43 U/L — ABNORMAL HIGH (ref 15–41)
Albumin: 3.4 g/dL — ABNORMAL LOW (ref 3.5–5.0)
Alkaline Phosphatase: 69 U/L (ref 38–126)
Anion gap: 7 (ref 5–15)
BUN: 24 mg/dL — ABNORMAL HIGH (ref 6–20)
CO2: 24 mmol/L (ref 22–32)
Calcium: 9.3 mg/dL (ref 8.9–10.3)
Chloride: 108 mmol/L (ref 98–111)
Creatinine, Ser: 1.11 mg/dL — ABNORMAL HIGH (ref 0.44–1.00)
GFR, Estimated: 59 mL/min — ABNORMAL LOW (ref >60)
Glucose, Bld: 147 mg/dL — ABNORMAL HIGH (ref 70–99)
Potassium: 4.2 mmol/L (ref 3.5–5.1)
Sodium: 139 mmol/L (ref 135–145)
Total Bilirubin: 1.5 mg/dL — ABNORMAL HIGH (ref 0.3–1.2)
Total Protein: 6.3 g/dL — ABNORMAL LOW (ref 6.5–8.1)

## 2020-11-18 LAB — PHOSPHORUS: Phosphorus: 4.3 mg/dL (ref 2.5–4.6)

## 2020-11-18 LAB — CK: Total CK: 1249 U/L — ABNORMAL HIGH (ref 38–234)

## 2020-11-18 LAB — CBC
HCT: 35.3 % — ABNORMAL LOW (ref 36.0–46.0)
Hemoglobin: 12 g/dL (ref 12.0–15.0)
MCH: 30.2 pg (ref 26.0–34.0)
MCHC: 34 g/dL (ref 30.0–36.0)
MCV: 88.9 fL (ref 80.0–100.0)
Platelets: 226 10*3/uL (ref 150–400)
RBC: 3.97 MIL/uL (ref 3.87–5.11)
RDW: 13.1 % (ref 11.5–15.5)
WBC: 12.5 10*3/uL — ABNORMAL HIGH (ref 4.0–10.5)
nRBC: 0 % (ref 0.0–0.2)

## 2020-11-18 LAB — MAGNESIUM: Magnesium: 2.5 mg/dL — ABNORMAL HIGH (ref 1.7–2.4)

## 2020-11-18 MED ORDER — DEXAMETHASONE 4 MG PO TABS: 4.0000 mg | ORAL_TABLET | Freq: Four times a day (QID) | ORAL | 0 refills | Status: DC

## 2020-11-18 MED ORDER — LEVETIRACETAM 1000 MG PO TABS: 1000.0000 mg | ORAL_TABLET | Freq: Two times a day (BID) | ORAL | 0 refills | Status: DC

## 2020-11-18 NOTE — Plan of Care (Signed)
  Patient is alert oriented x 4. Ambulatory. Pt is receiving IV decadron and keppra. Denies pain. No distress noted.  SCDs In place.  Problem: Education: Goal: Expressions of having a comfortable level of knowledge regarding the disease process will increase Outcome: Progressing   Problem: Coping: Goal: Ability to adjust to condition or change in health will improve Outcome: Progressing Goal: Ability to identify appropriate support needs will improve Outcome: Progressing   Problem: Health Behavior/Discharge Planning: Goal: Compliance with prescribed medication regimen will improve Outcome: Progressing   Problem: Medication: Goal: Risk for medication side effects will decrease Outcome: Progressing   Problem: Clinical Measurements: Goal: Complications related to the disease process, condition or treatment will be avoided or minimized Outcome: Progressing Goal: Diagnostic test results will improve Outcome: Progressing   Problem: Safety: Goal: Verbalization of understanding the information provided will improve Outcome: Progressing   Problem: Self-Concept: Goal: Level of anxiety will decrease Outcome: Progressing Goal: Ability to verbalize feelings about condition will improve Outcome: Progressing   Problem: Education: Goal: Knowledge of General Education information will improve Description: Including pain rating scale, medication(s)/side effects and non-pharmacologic comfort measures Outcome: Progressing   Problem: Health Behavior/Discharge Planning: Goal: Ability to manage health-related needs will improve Outcome: Progressing   Problem: Clinical Measurements: Goal: Ability to maintain clinical measurements within normal limits will improve Outcome: Progressing Goal: Will remain free from infection Outcome: Progressing Goal: Diagnostic test results will improve Outcome: Progressing Goal: Respiratory complications will improve Outcome: Progressing Goal:  Cardiovascular complication will be avoided Outcome: Progressing   Problem: Activity: Goal: Risk for activity intolerance will decrease Outcome: Progressing   Problem: Nutrition: Goal: Adequate nutrition will be maintained Outcome: Progressing   Problem: Coping: Goal: Level of anxiety will decrease Outcome: Progressing   Problem: Elimination: Goal: Will not experience complications related to bowel motility Outcome: Progressing Goal: Will not experience complications related to urinary retention Outcome: Progressing   Problem: Pain Managment: Goal: General experience of comfort will improve Outcome: Progressing   Problem: Safety: Goal: Ability to remain free from injury will improve Outcome: Progressing   Problem: Skin Integrity: Goal: Risk for impaired skin integrity will decrease Outcome: Progressing

## 2020-11-18 NOTE — Evaluation (Signed)
Occupational Therapy Evaluation Patient Details Name: [name] MRN: [id number] DOB: October 10, 1965 Today's Date: 11/18/2020    History of Present Illness 55 y/o female presented to ED on 7/13 with complaints of episode of loss of consciousness with incontinence. 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 Plan for tumor resection on 7/19 by neurosurgery. MRI abdomen show subtle lesion in inferior aspect of pancreatic head. PMH: Bell's palsy with chronic L facial droop, HTN, chronic hypokalemia, hypothyroidism, asthma.   Clinical Impression   Pt Ox4, good safety and currently endorsing only fatigue with completion of OT assessment. At baseline pt is inep with all ADL's and mobility and working full time from home. Pt tolerated completion of functional transfers, in room mobility for completion of self care tasks with mod indep. Overall pt's largest complaint is fatigue form inactivity. Discussed with pt benefit of OOB activity and mobility with nursing staff and family. No acute or post acute OT needs at this time with OT to sign off.      Follow Up Recommendations     No OT follow up  Equipment Recommendations     None Recommended by OT  Recommendations for Other Services       Precautions / Restrictions Precautions Precautions: None Restrictions Weight Bearing Restrictions: No      Mobility Bed Mobility Overal bed mobility: Modified Independent                  Transfers Overall transfer level: Modified independent                    Balance Overall balance assessment: No apparent balance deficits (not formally assessed)                                         ADL either performed or assessed with clinical judgement   ADL Overall ADL's : Modified independent                                       General ADL Comments: increased time and preference for unilateral UE  support during standing ADL completion but no LOB and good safety awareness and insight to limitations. demo's seated ands tanding ADL's at mod indep level     Vision Baseline Vision/History: No visual deficits       Perception     Praxis      Pertinent Vitals/Pain Pain Assessment: No/denies pain     Hand Dominance     Extremity/Trunk Assessment Upper Extremity Assessment Upper Extremity Assessment: Overall WFL for tasks assessed   Lower Extremity Assessment Lower Extremity Assessment: Overall WFL for tasks assessed   Cervical / Trunk Assessment Cervical / Trunk Assessment: Normal   Communication Communication Communication: No difficulties   Cognition Arousal/Alertness: Awake/alert Behavior During Therapy: WFL for tasks assessed/performed Overall Cognitive Status: Within Functional Limits for tasks assessed                                     General Comments       Exercises     Shoulder Instructions      Home Living Family/patient expects to be discharged to:: Private residence Living Arrangements: Children Available  Help at Discharge: Family;Available 24 hours/day Type of Home: House Home Access: Stairs to enter [employer] of Steps: 3 Entrance Stairs-Rails: Right;Left;Can reach both Home Layout: One level     Bathroom Shower/Tub: Teacher, early years/pre: Standard     Home Equipment: None          Prior Functioning/Environment Level of Independence: Independent        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, driving        OT Problem List: Decreased strength      OT Treatment/Interventions:      OT Goals(Current goals can be found in the care plan section) Acute Rehab OT Goals Patient Stated Goal: To be able to move better OT Goal Formulation: All assessment and education complete, DC therapy  OT Frequency:     Barriers to D/C:            Co-evaluation              AM-PAC OT "6 Clicks" Daily  Activity     Outcome Measure Help from another person eating meals?: None Help from another person taking care of personal grooming?: None Help from another person toileting, which includes using toliet, bedpan, or urinal?: None Help from another person bathing (including washing, rinsing, drying)?: None Help from another person to put on and taking off regular upper body clothing?: None Help from another person to put on and taking off regular lower body clothing?: None 6 Click Score: 24   End of Session Nurse Communication: Mobility status  Activity Tolerance: Patient tolerated treatment well Patient left: in bed;with call bell/phone within reach  OT Visit Diagnosis: Unsteadiness on feet (R26.81)                Time: 5449-2010 OT Time Calculation (min): 20 min Charges:  OT General Charges $OT Visit: 1 Visit OT Evaluation $OT Eval Low Complexity: 1 Low  Maryam Feely OTR/L acute rehab services Office: [phone]  11/18/2020, 1:18 PM

## 2020-11-18 NOTE — Progress Notes (Signed)
   Providing Compassionate, Quality Care - Together  NEUROSURGERY PROGRESS NOTE   S: No issues overnight.  Patient requesting to go home.  O: EXAM:  BP (!) 153/82 (BP Location: Left Arm)   Pulse (!) 44   Temp 97.7 F (36.5 C) (Oral)   Resp 18   Ht 5\' 8"  (1.727 m)   Wt 77.1 kg   SpO2 98%   BMI 25.85 kg/m   Awake, alert, oriented x3 PERRLA Speech fluent, appropriate  CNs grossly intact except left facial weakness, HB 2/6 5/5 BUE/BLE   ASSESSMENT:  55 y.o. female with   Left temporal enhancing mass  PLAN: -Plan for surgical resection on Tuesday 7/19.  Per Dr. Venetia Constable, okay with discharge and following up on Tuesday for surgery.  Will defer to remaining services about discharge timing. -Recommend continuing antiepileptics and steroids   Thank you for allowing me to participate in this patient's care.  Please do not hesitate to call with questions or concerns.   Elwin Sleight, [facility] Cell: [phone]

## 2020-11-18 NOTE — Discharge Summary (Signed)
Physician Discharge Summary  [name] KVQ:[id number] DOB: [birth year] DOA: 11/15/2020  PCP: [name], NP  Admit date: 11/15/2020 Discharge date: 11/18/2020  Admitted From: Home Disposition:  Home  Recommendations for Outpatient Follow-up:  Follow up with PCP in 1-2 weeks Follow up with Neurosurgery as scheduled  Discharge Condition:Stable CODE STATUS:Full Diet recommendation: Regular   Brief/Interim Summary: 55 year old F with PMH of Bell's palsy and chronic left facial droop, chronic hypokalemia, HTN, hypothyroidism and asthma presenting with new onset headache, possible syncope/seizure, urinary accident, nausea and emesis, and found to have 3.5 x 3.1 x 3.4 cm necrotic left temporal lobe mass with intralesional hemorrhage and surrounding edema and mild regional mass-effect concerning for malignancy.  Neurology, neurosurgery and neuro-oncology consulted.  She was a started on Keppra, Decadron and a statin.  CT chest/abdomen/pelvis showed 1.2 x 1.2 cm pancreatic head/uncinate process lesion, 14 mm lobular soft tissue nodularity in the right breast, multiple heterogeneous thyroid nodules with the largest measuring 16 mm in the isthmus, hepatomegaly and stable 4 mm right-sided pulmonary nodule.  MRI abdomen with very subtle lesion in the inferior aspect of the pancreatic head.  [facility], Dr. Carol Ada consulted.  Thyroid ultrasound pending.     Discharge Diagnoses:  Active Problems:   Seizure-like activity ([name])  Left temporal mass: A 3.5 x 3.1 x 3.4 cm necrotic left temporal mass with concern for intralesional hemorrhage and surrounding edema and mild regional mass on MRI.  Concern for primary glioma or metastatic lesion.  CT abdomen and pelvis revealed pancreatic and thyroid nodules as well as 14 mm lobular soft tissue nodularity in the right breast and stable 4 mm right-sided pulmonary nodule.  HIV nonreactive. -Neurosurgery and neuro-oncology consulted by admitting  provider. -Neurosurgery planning resection on 7/19 -Continue Decadron and Keppra -Per Neurosurgery, procedure can be done as outpatient. Will discharge on keppra and PO decadron   Pancreatic lesion: Noted on CT abdomen and pelvis, and MRI abdomen. -[facility], Dr. Carol Ada consulted. Per GI, no plan for EUS now, to re-evaluate later if needed to be done after above neurosurgical procedure   Thyroid nodule-has history of hypothyroidism.  TSH within normal.  Free T4 slightly elevated. -Recommend f/u as outpatient   Right breast lesion: A 14 mm lobular soft tissue nodularity in the right breast noted on CT chest.  Has previous history of breast lesions.  She had biopsy on her left breast previously, and pathology was benign. -F/u as outpatient   Seizure-presented with headache, mental status change, urinary accident, nausea and emesis.  EEG suggestive of cortical dysfunction arising from left temporal region but no active seizure or epileptiform discharge. Migraine headache is a possibility as well.  Symptoms resolved. -Continue Keppra and as needed Ativan -Continue seizure precautions   Acute on chronic hypokalemia: Likely due to thiazide diuretics.  Resolved.   Essential hypertension: Normotensive. -Continue losartan and HCTZ   Elevated AST/mild hyperbilirubinemia -Check CK   Hypothyroidism: TSH within normal.  Free T4 1.37 -Continue home Synthroid. -Follow-up thyroid ultrasound   History of asthma: Stable. -Continue breathing treatments   Bell's palsy with left facial droop: Stable   Leukocytosis: Likely demargination from steroid.   Discharge Instructions   Allergies as of 11/18/2020   No Known Allergies      Medication List     STOP taking these medications    HYDROcodone bit-homatropine 5-1.5 MG/5ML syrup Commonly known as: HYCODAN   montelukast 10 MG tablet Commonly known as: SINGULAIR   Spacer/Aero-Holding [employer]  SUMAtriptan 50 MG  tablet Commonly known as: Imitrex       TAKE these medications    albuterol 108 (90 Base) MCG/ACT inhaler Commonly known as: VENTOLIN HFA Inhale 2 puffs into the lungs every 6 (six) hours as needed for wheezing or shortness of breath.   atorvastatin 20 MG tablet Commonly known as: LIPITOR Take 1 tablet (20 mg total) by mouth daily.   dexamethasone 4 MG tablet Commonly known as: DECADRON Take 1 tablet (4 mg total) by mouth every 6 (six) hours.   Dialyvite Vitamin D 5000 125 MCG (5000 UT) capsule Generic drug: Cholecalciferol Take 1 capsule (5,000 Units total) by mouth daily.   hydrochlorothiazide 25 MG tablet Commonly known as: HYDRODIURIL TAKE 1 TABLET (25 MG TOTAL) BY MOUTH DAILY.   levETIRAcetam 1000 MG tablet Commonly known as: Keppra Take 1 tablet (1,000 mg total) by mouth 2 (two) times daily.   levothyroxine 100 MCG tablet Commonly known as: SYNTHROID Take 1 tablet (100 mcg total) by mouth daily.   losartan 100 MG tablet Commonly known as: COZAAR TAKE 1 TABLET BY MOUTH EVERY DAY   potassium chloride 10 MEQ tablet Commonly known as: KLOR-CON Take 1 tablet (10 mEq total) by mouth daily.        Follow-up Information     [name], NP. Schedule an appointment as soon as possible for a visit in 2 week(s).   Specialty: Family Medicine Why: Hospital follow up Contact information: [place] [address] [phone]         Judith Part, MD. Go on 11/21/2020.   Specialty: Neurosurgery Why: as scheduled Contact information: [place] [address] [phone]                No Known Allergies  Consultations: Neurosurgery GI  Procedures/Studies: DG Chest 2 View  Result Date: 11/09/2020 CLINICAL DATA:  Cough for 6 weeks. EXAM: CHEST - 2 VIEW COMPARISON:  08/10/2019 FINDINGS: The heart size and mediastinal contours are within normal limits. Mild scarring again seen in left lung base. No  evidence of pulmonary infiltrate or edema. No evidence of pleural effusion. The visualized skeletal structures are unremarkable. IMPRESSION: Stable exam. No active cardiopulmonary disease. Electronically Signed   By: Marlaine Hind M.D.   On: 11/09/2020 08:23   CT Head Wo Contrast  Result Date: 11/15/2020 CLINICAL DATA:  Possible seizure or syncope EXAM: CT HEAD WITHOUT CONTRAST TECHNIQUE: Contiguous axial images were obtained from the base of the skull through the vertex without intravenous contrast. COMPARISON:  None. FINDINGS: Brain: There is no acute intracranial hemorrhage. Low attenuation is present in the left temporal lobe extending posterosuperiorly into the deep white matter tracks there is mild regional mass effect. No substantial midline shift. No hydrocephalus. Gray-white differentiation is otherwise preserved. Vascular: No hyperdense vessel or unexpected calcification. Skull: Calvarium is unremarkable. Sinuses/Orbits: No acute finding. Other: None. IMPRESSION: Edema centered within the left temporal lobe suspicious for underlying lesion. Contrast enhanced MRI recommended for further evaluation. Electronically Signed   By: Macy Mis M.D.   On: 11/15/2020 15:07   MR BRAIN W WO CONTRAST  Result Date: 11/15/2020 CLINICAL DATA:  Abnormal CT EXAM: MRI HEAD WITHOUT AND WITH CONTRAST TECHNIQUE: Multiplanar, multiecho pulse sequences of the brain and surrounding structures were obtained without and with intravenous contrast. CONTRAST:  52mL GADAVIST GADOBUTROL 1 MMOL/ML IV SOLN COMPARISON:  None. FINDINGS: Brain: There is a heterogeneously enhancing, likely centrally necrotic lesion of the anterior left  temporal lobe measuring approximately 3.5 x 3.1 x 3.4 cm. Associated susceptibility likely reflects intralesional hemorrhage. There is surrounding T2 FLAIR hyperintensity extending posteriorly and superiorly about the insula and to the periatrial white matter. There is some mildly reduced diffusion  associated with the superior and posterior portion of this T2 hyperintensity. Additional nonspecific patchy foci of T2 hyperintensity in the supratentorial white matter may reflect gliosis or demyelination. Mass effect related to above is mild. No evidence of acute infarction. No hydrocephalus or extra-axial collection. Vascular: Major vessel flow voids at the skull base are preserved. Skull and upper cervical spine: Normal marrow signal is preserved. Sinuses/Orbits: Minor mucosal thickening.  Orbits are unremarkable. Other: Sella is unremarkable.  Mastoid air cells are clear. IMPRESSION: Necrotic left temporal lobe mass with intralesional hemorrhage. Surrounding edema and/or infiltrating tumor. Mild regional mass effect. Differential considerations are metastatic lesion and high-grade glioma. Electronically Signed   By: Macy Mis M.D.   On: 11/15/2020 17:11   MR ABDOMEN W WO CONTRAST  Result Date: 11/17/2020 CLINICAL DATA:  55 year old female with history of a pancreatic lesion noted on recent CT examination. Follow-up evaluation. EXAM: MRI ABDOMEN WITHOUT AND WITH CONTRAST TECHNIQUE: Multiplanar multisequence MR imaging of the abdomen was performed both before and after the administration of intravenous contrast. CONTRAST:  7.73mL GADAVIST GADOBUTROL 1 MMOL/ML IV SOLN COMPARISON:  No prior abdominal MRI. CT the chest, abdomen and pelvis 11/15/2020. FINDINGS: Lower chest: Unremarkable. Hepatobiliary: Mild diffuse loss of signal intensity throughout the hepatic parenchyma on out of phase dual echo images, indicative of a background of hepatic steatosis. No suspicious cystic or solid hepatic lesions. No intra or extrahepatic biliary ductal dilatation. Status post cholecystectomy. Pancreas: In the inferior aspect of the pancreatic head (axial image 84 of series 802 and coronal image 89 of series 9) there is a subtle lesion which is T1 isointense, T2 isointense and appears hypovascular on post gadolinium  imaging measuring 1.2 x 1.0 x 1.3 cm. This lesion is immediately to the right of the superior mesenteric vein, with an intervening fat plane, but makes contact with the anterior superior pancreaticoduodenal artery. No other pancreatic mass is noted. No peripancreatic fluid collections or inflammatory changes. Spleen:  Unremarkable. Adrenals/Urinary Tract: Bilateral kidneys and adrenal glands are normal in appearance. No hydroureteronephrosis in the visualized portions of the abdomen. Stomach/Bowel: Visualized portions are unremarkable. Vascular/Lymphatic: Aortic atherosclerosis, without evidence of aneurysm in the abdominal vasculature. Vascular findings pertinent to potential pancreatic neoplasm, as detailed above. No lymphadenopathy noted in the abdomen. Other: No significant volume of ascites noted in the visualized portions of the peritoneal cavity. Musculoskeletal: No aggressive appearing osseous lesions are noted in the visualized portions of the skeleton. IMPRESSION: 1. Very subtle lesion in the inferior aspect of the pancreatic head which has unusual imaging characteristics, but concerning for potential small neoplasm such as a primary pancreatic adenocarcinoma. Further evaluation with endoscopy and endoscopic ultrasound is strongly recommended in the near future to better characterize this finding. 2. No lymphadenopathy or definite signs of metastatic disease noted in the abdomen at this time. 3. Hepatic steatosis. 4. Aortic atherosclerosis. Electronically Signed   By: Vinnie Langton M.D.   On: 11/17/2020 08:13   CT CHEST ABDOMEN PELVIS W CONTRAST  Result Date: 11/15/2020 CLINICAL DATA:  Cancer of unknown primary. EXAM: CT CHEST, ABDOMEN, AND PELVIS WITH CONTRAST TECHNIQUE: Multidetector CT imaging of the chest, abdomen and pelvis was performed following the standard protocol during bolus administration of intravenous contrast. CONTRAST:  124mL OMNIPAQUE IOHEXOL  300 MG/ML  SOLN COMPARISON:  Chest CT  November 15, 2020 CT abdomen June 22, 2012 FINDINGS: CT CHEST FINDINGS Cardiovascular: No thoracic aortic aneurysm. Normal caliber central pulmonary arteries. Normal size heart. No significant pericardial effusion/thickening. Mediastinum/Nodes: No pathologically enlarged supraclavicular lymph nodes. Multiple heterogeneous appearing thyroid nodules the largest of which is in the isthmus measuring 16 mm on image 5/3. No pathologically enlarged mediastinal, hilar or axillary lymph nodes. Lungs/Pleura: Stable small right upper lobe pulmonary nodules, measuring up to 4 mm on image 49/4. Stable 4 mm subpleural right lower lobe pulmonary nodule on image 76/4. Stable 4 mm right middle lobe pulmonary nodule image 102/4. No new suspicious pulmonary nodules or masses. Right lower lobe calcified granuloma image 83/4. No pleural effusion or pneumothorax. Mild paraseptal emphysematous change. Stable mild biapical pleuroparenchymal scarring. Bibasilar atelectasis Musculoskeletal: Lobular soft tissue nodularity in the right breast internal calcification measuring 14 mm on image 39/3. Biopsy clip in the left breast and a left axillary lymph node. No aggressive lytic or blastic lesion of bone. CT ABDOMEN PELVIS FINDINGS Hepatobiliary: Hepatomegaly measuring 20.1 cm in maximum craniocaudal dimension. No suspicious hepatic lesion. Gallbladder surgically absent. No biliary ductal dilation. Pancreas: Hypodense 1.2 x 1.2 cm lesion in the pancreatic head/uncinate process on image 61/3. No pancreatic ductal dilation. Spleen: Within normal limits. Adrenals/Urinary Tract: Adrenal glands are unremarkable. Kidneys are normal, without renal calculi, focal lesion, or hydronephrosis. Bladder is unremarkable. Stomach/Bowel: Is grossly unremarkable for degree of distension. Radiopaque enteric contrast traverses the descending colon. No pathologic dilation of small bowel. Appendix and terminal ileum are grossly unremarkable. No suspicious colonic  wall thickening or mass like lesions visualized. Vascular/Lymphatic: Aortic atherosclerosis without aneurysmal dilation. No pathologically enlarged abdominal or pelvic lymph nodes. Reproductive: Status post hysterectomy. No adnexal masses. Other: No abdominal wall hernia or abnormality. No abdominopelvic ascites. Musculoskeletal: Transitional anatomy with sacralization of L5 on the left. No aggressive lytic or blastic lesion of bone. IMPRESSION: 1. Hypodense 1.2 x 1.2 cm lesion in the pancreatic head/uncinate process, which is nonspecific possibly representing a cystic pancreatic lesion, ceases primary pancreatic neoplasm. Further evaluation with MRI of the abdomen with and without contrast is recommended. 2. Lobular soft tissue nodularity in the right breast measuring 14 mm with an internal focus of calcification. Correlation with mammography is recommended. 3. Multiple heterogeneous appearing thyroid nodules the largest of which is in the isthmus measuring 16 mm. Recommend thyroid US (ref: J Am Coll Radiol. 2015 Feb;12(2): 143-50). 4. Hepatomegaly.  No focal hepatic lesion identified. 5. Small right-sided pulmonary nodules, measuring up to 4 mm, stable since 2018 and considered benign. 6. Aortic Atherosclerosis (ICD10-I70.0) and Emphysema (ICD10-J43.9). Electronically Signed   By: Dahlia Bailiff MD   On: 11/15/2020 23:41   DG Chest Portable 1 View  Result Date: 11/15/2020 CLINICAL DATA:  Syncope with incontinence. EXAM: PORTABLE CHEST 1 VIEW COMPARISON:  11/08/2020 FINDINGS: The heart size and mediastinal contours are within normal limits. Decreased lung volumes. No airspace opacities. The visualized skeletal structures are unremarkable. IMPRESSION: Low lung volumes.  No airspace opacities identified. Electronically Signed   By: Kerby Moors M.D.   On: 11/15/2020 13:41   EEG adult  Result Date: 11/16/2020 Lora Havens, MD     11/16/2020  4:38 PM Patient Name: SARAHY CREEDON MRN: [id number] Epilepsy  Attending: Lora Havens Referring Physician/Provider: Dr Wynetta Fines Date: 11/16/2020 Duration: 29.07 mins Patient history: 55yo F presented with seizure like activity. EEG to evaluate for seizure. Level of alertness: Awake AEDs  during EEG study: LEV Technical aspects: This EEG study was done with scalp electrodes positioned according to the 10-20 International system of electrode placement. Electrical activity was acquired at a sampling rate of 500Hz  and reviewed with a high frequency filter of 70Hz  and a low frequency filter of 1Hz . EEG data were recorded continuously and digitally stored. Description: The posterior dominant rhythm consists of 9-10 Hz activity of moderate voltage (25-35 uV) seen predominantly in posterior head regions, symmetric and reactive to eye opening and eye closing. EEG showed continuous left temporal 2-3Hz  delta slowing as well as intermittent generalized 2-3hz  sharply contoured delta slowing. Hyperventilation and photic stimulation were not performed.   ABNORMALITY - Intermittent slow, generalized - Continuous slow, left temporal  region IMPRESSION: This study is suggestive of cortical dysfunction arising from left temporal region,  likely secondary to underlying structural abnormality/mass, post-ictal state. Additionally there is mild diffuse encephalopathy, nonspecific etiology. No seizures or definite epileptiform discharges were seen throughout the recording. Priyanka Barbra Sarks   US THYROID  Result Date: 11/17/2020 CLINICAL DATA:  Thyroid nodule EXAM: THYROID ULTRASOUND TECHNIQUE: Ultrasound examination of the thyroid gland and adjacent soft tissues was performed. COMPARISON:  None. FINDINGS: Parenchymal Echotexture: Mildly heterogeneous Isthmus: 0.7 cm Right lobe: 4.4 x 1.3 x 1.4 cm Left lobe: 4.4 x 2.0 x 1.2 cm _________________________________________________________ Estimated total number of nodules >/= 1 cm: 1 Number of spongiform nodules >/=  2 cm not described below (TR1):  0 Number of mixed cystic and solid nodules >/= 1.5 cm not described below (TR2): 0 _________________________________________________________ Nodule # 1: Location: Isthmus; superior Maximum size: 1.2 cm; Other 2 dimensions: 1.1 x 0.9 cm Composition: mixed cystic and solid (1) Echogenicity: isoechoic (1) Shape: not taller-than-wide (0) Margins: smooth (0) Echogenic foci: none (0) ACR TI-RADS total points: 2. ACR TI-RADS risk category: TR2 (2 points). ACR TI-RADS recommendations: This nodule does NOT meet TI-RADS criteria for biopsy or dedicated follow-up. _________________________________________________________ Remaining subcentimeter thyroid nodules do not meet criteria for FNA or surveillance. IMPRESSION: No suspicious thyroid nodules. The above is in keeping with the ACR TI-RADS recommendations - J Am Coll Radiol 2017;14:587-595. Electronically Signed   By: Miachel Roux M.D.   On: 11/17/2020 16:07   CT CHEST ABDOMEN PELVIS WO CONTRAST  Result Date: 11/15/2020 CLINICAL DATA:  Cancer of unknown primary. EXAM: CT CHEST, ABDOMEN AND PELVIS WITHOUT CONTRAST TECHNIQUE: Multidetector CT imaging of the chest, abdomen and pelvis was performed following the standard protocol without IV contrast. COMPARISON:  November 23, 2019 and June 22, 2012 FINDINGS: CT CHEST FINDINGS Cardiovascular: No thoracic aortic aneurysm. Normal caliber central pulmonary arteries. Normal size heart. No significant pericardial effusion/thickening. Mediastinum/Nodes: No pathologically enlarged supraclavicular nodes. Apparent nodular enlargement the thyroid without discrete nodularity. No pathologically enlarged mediastinal, hilar or axillary lymph nodes, noting limited sensitivity for the detection of hilar adenopathy on this noncontrast study. The trachea esophagus are grossly unremarkable. Lungs/Pleura: Stable small right upper lobe pulmonary nodules, measuring up to 4 mm on image 48/4. Stable 4 mm subpleural right lower lobe pulmonary nodule  on image 75/4. Stable 4 mm right middle lobe pulmonary nodule image 104/4. No new suspicious pulmonary nodules or masses. Right lower lobe calcified granuloma image 78/4. No pleural effusion or pneumothorax. Mild paraseptal emphysematous change. Stable mild biapical pleuroparenchymal scarring. Bibasilar atelectasis. Musculoskeletal: No chest wall mass or suspicious bone lesions identified. CT ABDOMEN PELVIS FINDINGS Hepatobiliary: Hepatomegaly measuring 20.2 cm in maximum craniocaudal dimension. No discrete hepatic lesion visualized. Gallbladder is surgically absent. No biliary  ductal dilation. Pancreas: Hypodense 1.6 x 1.5 cm area in the pancreatic head/uncinate process on image 60/3. No pancreatic ductal dilation. Spleen: Within normal limits. Adrenals/Urinary Tract: Adrenal glands are unremarkable. Kidneys are normal, without renal calculi, contour deforming lesion, or hydronephrosis. Bladder is unremarkable. Stomach/Bowel: Radiopaque enteric contrast traverses the descending colon. Stomach is grossly unremarkable for degree of distension. Pathologic dilation of small. The appendix and terminal are grossly. Portions of the sigmoid colon and rectum decompressed limiting evaluation. Vascular/Lymphatic: Aortic atherosclerosis without aneurysmal dilation. No pathologically enlarged abdominal or pelvic lymph nodes. Reproductive: Status post hysterectomy. No adnexal masses. Other: Abdominopelvic ascites. Musculoskeletal: Transitional anatomy with sacralization of the left L5 vertebral body. No aggressive lytic or blastic lesion of bone. IMPRESSION: 1. Hypodense 1.6 x 1.5 cm area in the pancreatic head/uncinate process, incompletely evaluated without intravenous contrast material and possibly represent fatty infiltration, cystic pancreatic lesion, pseudocyst or primary pancreatic neoplasm. No pancreatic ductal dilation. Further evaluation with MRI of the abdomen with and without contrast is suggested. 2. Apparent  nodular enlargement of the thyroid, without discrete thyroid nodule visualized. Nonspecific and possibly within normal limits. However, this could be further evaluated dedicated thyroid ultrasound. 3. Hepatomegaly, of indeterminate clinical significance. No discrete focal lesion visualized on this noncontrast examination. 4. Small pulmonary nodules stable since 2018, favor benign. 5. Aortic Atherosclerosis (ICD10-I70.0) and Emphysema (ICD10-J43.9). Electronically Signed   By: Dahlia Bailiff MD   On: 11/15/2020 23:19    Subjective: Very eager to go home  Discharge Exam: Vitals:   11/18/20 0821 11/18/20 1114  BP: (!) 153/82 (!) 148/76  Pulse: (!) 44 (!) 45  Resp: 18 20  Temp:  97.9 F (36.6 C)  SpO2: 98% 100%   Vitals:   11/18/20 0339 11/18/20 0820 11/18/20 0821 11/18/20 1114  BP: (!) 149/84  (!) 153/82 (!) 148/76  Pulse: (!) 52  (!) 44 (!) 45  Resp: 16 15 18 20   Temp: 97.7 F (36.5 C)   97.9 F (36.6 C)  TempSrc: Oral   Oral  SpO2: 100%  98% 100%  Weight:      Height:        General: Pt is alert, awake, not in acute distress Cardiovascular: RRR, S1/S2 + Respiratory: CTA bilaterally, no wheezing, no rhonchi Abdominal: Soft, NT, ND, bowel sounds + Extremities: no edema, no cyanosis   The results of significant diagnostics from this hospitalization (including imaging, microbiology, ancillary and laboratory) are listed below for reference.     Microbiology: Recent Results (from the past 240 hour(s))  Resp Panel by RT-PCR (Flu A&B, Covid) Nasopharyngeal Swab     Status: None   Collection Time: 11/15/20 12:05 PM   Specimen: Nasopharyngeal Swab; Nasopharyngeal(NP) swabs in vial transport medium  Result Value Ref Range Status   SARS Coronavirus 2 by RT PCR NEGATIVE NEGATIVE Final    Comment: (NOTE) SARS-CoV-2 target nucleic acids are NOT DETECTED.  The SARS-CoV-2 RNA is generally detectable in upper respiratory specimens during the acute phase of infection. The  lowest concentration of SARS-CoV-2 viral copies this assay can detect is 138 copies/mL. A negative result does not preclude SARS-Cov-2 infection and should not be used as the sole basis for treatment or other patient management decisions. A negative result may occur with  improper specimen collection/handling, submission of specimen other than nasopharyngeal swab, presence of viral mutation(s) within the areas targeted by this assay, and inadequate number of viral copies(<138 copies/mL). A negative result must be combined with clinical observations, patient history, and epidemiological  information. The expected result is Negative.  Fact Sheet for Patients:  EntrepreneurPulse.com.au  Fact Sheet for Healthcare Providers:  IncredibleEmployment.be  This test is no t yet approved or cleared by the [place] FDA and  has been authorized for detection and/or diagnosis of SARS-CoV-2 by FDA under an Emergency Use Authorization (EUA). This EUA will remain  in effect (meaning this test can be used) for the duration of the COVID-19 declaration under Section 564(b)(1) of the Act, 21 U.S.C.section 360bbb-3(b)(1), unless the authorization is terminated  or revoked sooner.       Influenza A by PCR NEGATIVE NEGATIVE Final   Influenza B by PCR NEGATIVE NEGATIVE Final    Comment: (NOTE) The Xpert Xpress SARS-CoV-2/FLU/RSV plus assay is intended as an aid in the diagnosis of influenza from Nasopharyngeal swab specimens and should not be used as a sole basis for treatment. Nasal washings and aspirates are unacceptable for Xpert Xpress SARS-CoV-2/FLU/RSV testing.  Fact Sheet for Patients: EntrepreneurPulse.com.au  Fact Sheet for Healthcare Providers: IncredibleEmployment.be  This test is not yet approved or cleared by the [place] FDA and has been authorized for detection and/or diagnosis of SARS-CoV-2 by FDA under  an Emergency Use Authorization (EUA). This EUA will remain in effect (meaning this test can be used) for the duration of the COVID-19 declaration under Section 564(b)(1) of the Act, 21 U.S.C. section 360bbb-3(b)(1), unless the authorization is terminated or revoked.  Performed at [facility], [place] [address], [place], [place] [address]      Labs: BNP (last 3 results) No results for input(s): BNP in the last 8760 hours. Basic Metabolic Panel: Recent Labs  Lab 11/15/20 0938 11/15/20 1205 11/15/20 1814 11/16/20 0802 11/17/20 0146 11/18/20 0325  NA 138  --   --  140 137 139  K 2.7*  --  3.1* 4.0 4.1 4.2  CL 101  --   --  108 109 108  CO2 24  --   --  23 20* 24  GLUCOSE 117*  --   --  134* 109* 147*  BUN 9  --   --  10 20 24*  CREATININE 1.11*  --   --  1.02* 1.09* 1.11*  CALCIUM 9.6  --   --  9.0 9.3 9.3  MG  --  2.3  --  2.4 2.5* 2.5*  PHOS  --   --  3.2 4.2 3.4 4.3   Liver Function Tests: Recent Labs  Lab 11/15/20 1205 11/16/20 0802 11/17/20 0146 11/18/20 0325  AST 52*  --  73* 43*  ALT 25  --  33 35  ALKPHOS 87  --  75 69  BILITOT 1.7*  --  1.5* 1.5*  PROT 7.4  --  6.3* 6.3*  ALBUMIN 4.2 3.4* 3.5 3.4*   Recent Labs  Lab 11/16/20 0802  LIPASE 20   No results for input(s): AMMONIA in the last 168 hours. CBC: Recent Labs  Lab 11/15/20 0938 11/16/20 0802 11/17/20 0146 11/18/20 0325  WBC 10.6* 10.6* 13.6* 12.5*  NEUTROABS  --   --  11.8*  --   HGB 14.0 13.0 11.6* 12.0  HCT 42.0 38.3 35.0* 35.3*  MCV 89.6 87.6 89.3 88.9  PLT 255 223 227 226   Cardiac Enzymes: Recent Labs  Lab 11/18/20 0325  CKTOTAL 1,249*   BNP: Invalid input(s): POCBNP CBG: Recent Labs  Lab 11/15/20 0942  GLUCAP 121*   D-Dimer No results for input(s): DDIMER in the last 72 hours. Hgb A1c  No results for input(s): HGBA1C in the last 72 hours. Lipid Profile No results for input(s): CHOL, HDL, LDLCALC, TRIG, CHOLHDL, LDLDIRECT in the last 72 hours. Thyroid function  studies No results for input(s): TSH, T4TOTAL, T3FREE, THYROIDAB in the last 72 hours.  Invalid input(s): FREET3 Anemia work up No results for input(s): VITAMINB12, FOLATE, FERRITIN, TIBC, IRON, RETICCTPCT in the last 72 hours. Urinalysis    Component Value Date/Time   COLORURINE YELLOW 11/15/2020 0938   APPEARANCEUR CLOUDY (A) 11/15/2020 0938   LABSPEC 1.018 11/15/2020 0938   PHURINE 5.0 11/15/2020 0938   GLUCOSEU NEGATIVE 11/15/2020 0938   HGBUR MODERATE (A) 11/15/2020 0938   HGBUR negative 01/26/2010 1528   BILIRUBINUR NEGATIVE 11/15/2020 0938   BILIRUBINUR n 05/23/2011 1649   KETONESUR NEGATIVE 11/15/2020 0938   PROTEINUR NEGATIVE 11/15/2020 0938   UROBILINOGEN 1.0 06/22/2012 1540   NITRITE NEGATIVE 11/15/2020 0938   LEUKOCYTESUR NEGATIVE 11/15/2020 0938   Sepsis Labs Invalid input(s): PROCALCITONIN,  WBC,  LACTICIDVEN Microbiology Recent Results (from the past 240 hour(s))  Resp Panel by RT-PCR (Flu A&B, Covid) Nasopharyngeal Swab     Status: None   Collection Time: 11/15/20 12:05 PM   Specimen: Nasopharyngeal Swab; Nasopharyngeal(NP) swabs in vial transport medium  Result Value Ref Range Status   SARS Coronavirus 2 by RT PCR NEGATIVE NEGATIVE Final    Comment: (NOTE) SARS-CoV-2 target nucleic acids are NOT DETECTED.  The SARS-CoV-2 RNA is generally detectable in upper respiratory specimens during the acute phase of infection. The lowest concentration of SARS-CoV-2 viral copies this assay can detect is 138 copies/mL. A negative result does not preclude SARS-Cov-2 infection and should not be used as the sole basis for treatment or other patient management decisions. A negative result may occur with  improper specimen collection/handling, submission of specimen other than nasopharyngeal swab, presence of viral mutation(s) within the areas targeted by this assay, and inadequate number of viral copies(<138 copies/mL). A negative result must be combined with clinical  observations, patient history, and epidemiological information. The expected result is Negative.  Fact Sheet for Patients:  EntrepreneurPulse.com.au  Fact Sheet for Healthcare Providers:  IncredibleEmployment.be  This test is no t yet approved or cleared by the [place] FDA and  has been authorized for detection and/or diagnosis of SARS-CoV-2 by FDA under an Emergency Use Authorization (EUA). This EUA will remain  in effect (meaning this test can be used) for the duration of the COVID-19 declaration under Section 564(b)(1) of the Act, 21 U.S.C.section 360bbb-3(b)(1), unless the authorization is terminated  or revoked sooner.       Influenza A by PCR NEGATIVE NEGATIVE Final   Influenza B by PCR NEGATIVE NEGATIVE Final    Comment: (NOTE) The Xpert Xpress SARS-CoV-2/FLU/RSV plus assay is intended as an aid in the diagnosis of influenza from Nasopharyngeal swab specimens and should not be used as a sole basis for treatment. Nasal washings and aspirates are unacceptable for Xpert Xpress SARS-CoV-2/FLU/RSV testing.  Fact Sheet for Patients: EntrepreneurPulse.com.au  Fact Sheet for Healthcare Providers: IncredibleEmployment.be  This test is not yet approved or cleared by the [place] FDA and has been authorized for detection and/or diagnosis of SARS-CoV-2 by FDA under an Emergency Use Authorization (EUA). This EUA will remain in effect (meaning this test can be used) for the duration of the COVID-19 declaration under Section 564(b)(1) of the Act, 21 U.S.C. section 360bbb-3(b)(1), unless the authorization is terminated or revoked.  Performed at [facility], [place],  [place] [address]    Time spent: 68min  SIGNED:   Marylu Lund, MD  [facility] 11/18/2020, 12:34 PM  If 7PM-7AM, please contact night-coverage

## 2020-11-20 ENCOUNTER — Encounter (HOSPITAL_COMMUNITY): Payer: Self-pay | Admitting: Neurological Surgery

## 2020-11-20 ENCOUNTER — Other Ambulatory Visit: Payer: Self-pay

## 2020-11-20 NOTE — Progress Notes (Signed)
[name] denies chest pain or shortness of breath. [name] tested negative for Covid on 11/15/20, patient will be tested on arrival, tomorrow.   [name] denies any seizure activity since 11/15/20.  I instructed patient to shower with antibiotic soap, if it is available.  Dry off with a clean towel. Do not put lotion, powder, cologne or deodorant or makeup.No jewelry or piercings. Man may shave their face and neck. Woman should not shave. No nail polish or artificial nails, or acrylic nails.  Wear clean clothes, brush your teeth. Glasses, contact lens,dentures or partials may not be worn in the OR. If you need to wear them, please bring a case for glasses, do not wear contacts or bring a case, the hospital does not have contact cases, dentures or partials will have to be removed  make sure they are clean, we will provide a denture cup to put them in.

## 2020-11-20 NOTE — Progress Notes (Signed)
Patient did not answer, no voicemail set up.  Patient's brother stated they were already aware of new arrival time of 31

## 2020-11-20 NOTE — Anesthesia Preprocedure Evaluation (Addendum)
Anesthesia Evaluation  Patient identified by MRN, date of birth, ID band Patient awake    Reviewed: Allergy & Precautions, NPO status , Patient's Chart, lab work & pertinent test results  History of Anesthesia Complications (+) PONV and history of anesthetic complications  Airway Mallampati: IV  TM Distance: >3 FB Neck ROM: Full    Dental no notable dental hx.    Pulmonary sleep apnea ,    Pulmonary exam normal breath sounds clear to auscultation       Cardiovascular hypertension, Pt. on medications Normal cardiovascular exam Rhythm:Regular Rate:Normal     Neuro/Psych  Headaches, Seizures -,   Neuromuscular disease negative psych ROS   GI/Hepatic negative GI ROS, Neg liver ROS,   Endo/Other  Hypothyroidism   Renal/GU negative Renal ROS     Musculoskeletal negative musculoskeletal ROS (+)   Abdominal   Peds  Hematology negative hematology ROS (+)   Anesthesia Other Findings Brain tumor Covid positive  Reproductive/Obstetrics                           Anesthesia Physical Anesthesia Plan  ASA: 3  Anesthesia Plan: General   Post-op Pain Management:    Induction: Intravenous  PONV Risk Score and Plan: 4 or greater and Ondansetron, Treatment may vary due to age or medical condition, Dexamethasone and Amisulpride  Airway Management Planned: Oral ETT  Additional Equipment: Arterial line  Intra-op Plan:   Post-operative Plan: Extubation in OR  Informed Consent: I have reviewed the patients History and Physical, chart, labs and discussed the procedure including the risks, benefits and alternatives for the proposed anesthesia with the patient or authorized representative who has indicated his/her understanding and acceptance.     Dental advisory given  Plan Discussed with: CRNA  Anesthesia Plan Comments:        Anesthesia Quick Evaluation

## 2020-11-21 ENCOUNTER — Encounter: Payer: Self-pay | Admitting: Internal Medicine

## 2020-11-21 ENCOUNTER — Inpatient Hospital Stay (HOSPITAL_COMMUNITY)
Admission: RE | Admit: 2020-11-21 | Discharge: 2020-11-24 | Disposition: A | Discharge status: Home / Self Care | Payer: BC Managed Care – PPO | Source: Home / Self Care | Attending: Neurological Surgery | Admitting: Neurological Surgery

## 2020-11-21 ENCOUNTER — Inpatient Hospital Stay (HOSPITAL_COMMUNITY): Payer: BC Managed Care – PPO

## 2020-11-21 ENCOUNTER — Encounter (HOSPITAL_COMMUNITY): Payer: Self-pay | Admitting: Neurological Surgery

## 2020-11-21 ENCOUNTER — Other Ambulatory Visit: Payer: Self-pay

## 2020-11-21 ENCOUNTER — Encounter (HOSPITAL_COMMUNITY)
Admission: RE | Disposition: A | Discharge status: Home / Self Care | Payer: Self-pay | Source: Home / Self Care | Attending: Neurological Surgery

## 2020-11-21 DIAGNOSIS — G4730 Sleep apnea, unspecified: Secondary | ICD-10-CM | POA: Diagnosis present

## 2020-11-21 DIAGNOSIS — D496 Neoplasm of unspecified behavior of brain: Secondary | ICD-10-CM | POA: Diagnosis present

## 2020-11-21 DIAGNOSIS — I10 Essential (primary) hypertension: Secondary | ICD-10-CM | POA: Diagnosis present

## 2020-11-21 DIAGNOSIS — C719 Malignant neoplasm of brain, unspecified: Secondary | ICD-10-CM | POA: Diagnosis present

## 2020-11-21 DIAGNOSIS — Z833 Family history of diabetes mellitus: Secondary | ICD-10-CM

## 2020-11-21 DIAGNOSIS — Z8249 Family history of ischemic heart disease and other diseases of the circulatory system: Secondary | ICD-10-CM

## 2020-11-21 DIAGNOSIS — G936 Cerebral edema: Secondary | ICD-10-CM | POA: Diagnosis present

## 2020-11-21 DIAGNOSIS — U071 COVID-19: Secondary | ICD-10-CM | POA: Diagnosis present

## 2020-11-21 DIAGNOSIS — Z9889 Other specified postprocedural states: Secondary | ICD-10-CM

## 2020-11-21 DIAGNOSIS — Z841 Family history of disorders of kidney and ureter: Secondary | ICD-10-CM

## 2020-11-21 DIAGNOSIS — E785 Hyperlipidemia, unspecified: Secondary | ICD-10-CM | POA: Diagnosis present

## 2020-11-21 HISTORY — DX: Respiratory tuberculosis unspecified: A15.9

## 2020-11-21 HISTORY — PX: APPLICATION OF CRANIAL NAVIGATION: SHX6578

## 2020-11-21 HISTORY — DX: Other specified postprocedural states: Z98.890

## 2020-11-21 HISTORY — DX: Other specified postprocedural states: R11.2

## 2020-11-21 HISTORY — DX: Pneumonia, unspecified organism: J18.9

## 2020-11-21 HISTORY — PX: CRANIOTOMY: SHX93

## 2020-11-21 HISTORY — DX: Sleep apnea, unspecified: G47.30

## 2020-11-21 HISTORY — DX: Other complications of anesthesia, initial encounter: T88.59XA

## 2020-11-21 HISTORY — DX: Personal history of other medical treatment: Z92.89

## 2020-11-21 LAB — MRSA NEXT GEN BY PCR, NASAL: MRSA by PCR Next Gen: NOT DETECTED

## 2020-11-21 LAB — CBC
HCT: 31.3 % — ABNORMAL LOW (ref 36.0–46.0)
Hemoglobin: 10.5 g/dL — ABNORMAL LOW (ref 12.0–15.0)
MCH: 30.1 pg (ref 26.0–34.0)
MCHC: 33.5 g/dL (ref 30.0–36.0)
MCV: 89.7 fL (ref 80.0–100.0)
Platelets: 232 10*3/uL (ref 150–400)
RBC: 3.49 MIL/uL — ABNORMAL LOW (ref 3.87–5.11)
RDW: 13.2 % (ref 11.5–15.5)
WBC: 14.5 10*3/uL — ABNORMAL HIGH (ref 4.0–10.5)
nRBC: 0 % (ref 0.0–0.2)

## 2020-11-21 LAB — TYPE AND SCREEN
ABO/RH(D): O POS
Antibody Screen: NEGATIVE

## 2020-11-21 LAB — CREATININE, SERUM
Creatinine, Ser: 1.13 mg/dL — ABNORMAL HIGH (ref 0.44–1.00)
GFR, Estimated: 58 mL/min — ABNORMAL LOW (ref >60)

## 2020-11-21 LAB — SARS CORONAVIRUS 2 BY RT PCR (HOSPITAL ORDER, PERFORMED IN ~~LOC~~ HOSPITAL LAB): SARS Coronavirus 2: POSITIVE — AB

## 2020-11-21 SURGERY — CRANIOTOMY TUMOR EXCISION
Anesthesia: General | Site: Head

## 2020-11-21 MED ORDER — DEXAMETHASONE SODIUM PHOSPHATE 10 MG/ML IJ SOLN
INTRAMUSCULAR | Status: DC | PRN
Start: 2020-11-21 — End: 2020-11-21
  Administered 2020-11-21: 10 mg via INTRAVENOUS

## 2020-11-21 MED ORDER — EPHEDRINE SULFATE-NACL 50-0.9 MG/10ML-% IV SOSY
PREFILLED_SYRINGE | INTRAVENOUS | Status: DC | PRN
Start: 2020-11-21 — End: 2020-11-21
  Administered 2020-11-21: 2.5 mg via INTRAVENOUS
  Administered 2020-11-21: 10 mg via INTRAVENOUS

## 2020-11-21 MED ORDER — GLYCOPYRROLATE PF 0.2 MG/ML IJ SOSY
PREFILLED_SYRINGE | INTRAMUSCULAR | Status: AC
  Filled 2020-11-21: qty 1

## 2020-11-21 MED ORDER — ONDANSETRON HCL 4 MG/2ML IJ SOLN
INTRAMUSCULAR | Status: AC
  Filled 2020-11-21: qty 2

## 2020-11-21 MED ORDER — ALBUMIN HUMAN 5 % IV SOLN: INTRAVENOUS | Status: DC | PRN

## 2020-11-21 MED ORDER — HYDROCHLOROTHIAZIDE 25 MG PO TABS
25.0000 mg | ORAL_TABLET | Freq: Every day | ORAL | Status: DC
  Administered 2020-11-22 – 2020-11-24 (×3): 25 mg via ORAL
  Filled 2020-11-21 (×3): qty 1

## 2020-11-21 MED ORDER — THROMBIN 20000 UNITS EX SOLR
CUTANEOUS | Status: DC | PRN
  Administered 2020-11-21: 20 mL via TOPICAL

## 2020-11-21 MED ORDER — OXYCODONE HCL 5 MG/5ML PO SOLN
5.0000 mg | Freq: Once | ORAL | Status: DC | PRN
Start: 2020-11-21 — End: 2020-11-21

## 2020-11-21 MED ORDER — LEVOTHYROXINE SODIUM 100 MCG PO TABS
100.0000 ug | ORAL_TABLET | Freq: Every day | ORAL | Status: DC
  Administered 2020-11-22 – 2020-11-24 (×3): 100 ug via ORAL
  Filled 2020-11-21 (×3): qty 1

## 2020-11-21 MED ORDER — ORAL CARE MOUTH RINSE: 15.0000 mL | Freq: Once | OROMUCOSAL | Status: AC

## 2020-11-21 MED ORDER — ALBUTEROL SULFATE (2.5 MG/3ML) 0.083% IN NEBU: 3.0000 mL | INHALATION_SOLUTION | Freq: Four times a day (QID) | RESPIRATORY_TRACT | Status: DC | PRN

## 2020-11-21 MED ORDER — HEMOSTATIC AGENTS (NO CHARGE) OPTIME
TOPICAL | Status: DC | PRN
  Administered 2020-11-21: 1 via TOPICAL

## 2020-11-21 MED ORDER — DEXAMETHASONE SODIUM PHOSPHATE 10 MG/ML IJ SOLN
INTRAMUSCULAR | Status: AC
  Filled 2020-11-21: qty 1

## 2020-11-21 MED ORDER — FENTANYL CITRATE (PF) 100 MCG/2ML IJ SOLN
25.0000 ug | INTRAMUSCULAR | Status: DC | PRN
  Administered 2020-11-21: 50 ug via INTRAVENOUS

## 2020-11-21 MED ORDER — SODIUM CHLORIDE 0.9 % IV SOLN
INTRAVENOUS | Status: DC | PRN
Start: 2020-11-21 — End: 2020-11-21

## 2020-11-21 MED ORDER — THROMBIN 5000 UNITS EX SOLR
CUTANEOUS | Status: AC
  Filled 2020-11-21: qty 5000

## 2020-11-21 MED ORDER — ACETAMINOPHEN 325 MG PO TABS
650.0000 mg | ORAL_TABLET | ORAL | Status: DC | PRN
  Administered 2020-11-22 – 2020-11-23 (×2): 650 mg via ORAL
  Filled 2020-11-21 (×2): qty 2

## 2020-11-21 MED ORDER — ROCURONIUM BROMIDE 10 MG/ML (PF) SYRINGE
PREFILLED_SYRINGE | INTRAVENOUS | Status: AC
  Filled 2020-11-21: qty 20

## 2020-11-21 MED ORDER — POTASSIUM CHLORIDE CRYS ER 10 MEQ PO TBCR
10.0000 meq | EXTENDED_RELEASE_TABLET | Freq: Every day | ORAL | Status: DC
  Administered 2020-11-22 – 2020-11-24 (×3): 10 meq via ORAL
  Filled 2020-11-21 (×5): qty 1

## 2020-11-21 MED ORDER — GLYCOPYRROLATE PF 0.2 MG/ML IJ SOSY
PREFILLED_SYRINGE | INTRAMUSCULAR | Status: DC | PRN
  Administered 2020-11-21: .2 mg via INTRAVENOUS

## 2020-11-21 MED ORDER — SUCCINYLCHOLINE CHLORIDE 200 MG/10ML IV SOSY
PREFILLED_SYRINGE | INTRAVENOUS | Status: DC | PRN
  Administered 2020-11-21: 80 mg via INTRAVENOUS

## 2020-11-21 MED ORDER — BACITRACIN ZINC 500 UNIT/GM EX OINT
TOPICAL_OINTMENT | CUTANEOUS | Status: DC | PRN
  Administered 2020-11-21: 1 via TOPICAL

## 2020-11-21 MED ORDER — LIDOCAINE-EPINEPHRINE 1 %-1:100000 IJ SOLN
INTRAMUSCULAR | Status: AC
  Filled 2020-11-21: qty 1

## 2020-11-21 MED ORDER — LIDOCAINE 2% (20 MG/ML) 5 ML SYRINGE
INTRAMUSCULAR | Status: DC | PRN
  Administered 2020-11-21: 60 mg via INTRAVENOUS

## 2020-11-21 MED ORDER — ATORVASTATIN CALCIUM 10 MG PO TABS
20.0000 mg | ORAL_TABLET | Freq: Every day | ORAL | Status: DC
  Administered 2020-11-22 – 2020-11-24 (×3): 20 mg via ORAL
  Filled 2020-11-21 (×3): qty 2

## 2020-11-21 MED ORDER — 0.9 % SODIUM CHLORIDE (POUR BTL) OPTIME
TOPICAL | Status: DC | PRN
  Administered 2020-11-21: 3000 mL

## 2020-11-21 MED ORDER — MIDAZOLAM HCL 2 MG/2ML IJ SOLN
INTRAMUSCULAR | Status: AC
  Filled 2020-11-21: qty 2

## 2020-11-21 MED ORDER — ROCURONIUM BROMIDE 10 MG/ML (PF) SYRINGE
PREFILLED_SYRINGE | INTRAVENOUS | Status: DC | PRN
  Administered 2020-11-21: 10 mg via INTRAVENOUS
  Administered 2020-11-21: 50 mg via INTRAVENOUS
  Administered 2020-11-21: 20 mg via INTRAVENOUS
  Administered 2020-11-21: 50 mg via INTRAVENOUS
  Administered 2020-11-21: 10 mg via INTRAVENOUS

## 2020-11-21 MED ORDER — EPHEDRINE 5 MG/ML INJ
INTRAVENOUS | Status: AC
  Filled 2020-11-21: qty 5

## 2020-11-21 MED ORDER — ONDANSETRON HCL 4 MG/2ML IJ SOLN
INTRAMUSCULAR | Status: DC | PRN
  Administered 2020-11-21: 4 mg via INTRAVENOUS

## 2020-11-21 MED ORDER — HEPARIN SODIUM (PORCINE) 5000 UNIT/ML IJ SOLN
5000.0000 [IU] | Freq: Three times a day (TID) | INTRAMUSCULAR | Status: DC
  Administered 2020-11-23 – 2020-11-24 (×4): 5000 [IU] via SUBCUTANEOUS
  Filled 2020-11-21 (×3): qty 1

## 2020-11-21 MED ORDER — FENTANYL CITRATE (PF) 250 MCG/5ML IJ SOLN
INTRAMUSCULAR | Status: AC
  Filled 2020-11-21: qty 5

## 2020-11-21 MED ORDER — ACETAMINOPHEN 10 MG/ML IV SOLN
INTRAVENOUS | Status: DC | PRN
  Administered 2020-11-21: 1000 mg via INTRAVENOUS

## 2020-11-21 MED ORDER — LOSARTAN POTASSIUM 50 MG PO TABS
100.0000 mg | ORAL_TABLET | Freq: Every day | ORAL | Status: DC
  Administered 2020-11-22 – 2020-11-24 (×3): 100 mg via ORAL
  Filled 2020-11-21 (×3): qty 2

## 2020-11-21 MED ORDER — BACITRACIN ZINC 500 UNIT/GM EX OINT
TOPICAL_OINTMENT | CUTANEOUS | Status: AC
  Filled 2020-11-21: qty 28.35

## 2020-11-21 MED ORDER — LIDOCAINE 2% (20 MG/ML) 5 ML SYRINGE
INTRAMUSCULAR | Status: AC
  Filled 2020-11-21: qty 5

## 2020-11-21 MED ORDER — DOCUSATE SODIUM 100 MG PO CAPS
100.0000 mg | ORAL_CAPSULE | Freq: Two times a day (BID) | ORAL | Status: DC
  Administered 2020-11-21 – 2020-11-24 (×6): 100 mg via ORAL
  Filled 2020-11-21 (×6): qty 1

## 2020-11-21 MED ORDER — OXYCODONE HCL 5 MG PO TABS: 5.0000 mg | ORAL_TABLET | Freq: Once | ORAL | Status: DC | PRN

## 2020-11-21 MED ORDER — PROMETHAZINE HCL 12.5 MG PO TABS
12.5000 mg | ORAL_TABLET | ORAL | Status: DC | PRN
  Filled 2020-11-21: qty 2

## 2020-11-21 MED ORDER — CHLORHEXIDINE GLUCONATE 0.12 % MT SOLN
15.0000 mL | Freq: Once | OROMUCOSAL | Status: AC
  Administered 2020-11-21: 15 mL via OROMUCOSAL
  Filled 2020-11-21: qty 15

## 2020-11-21 MED ORDER — SUMATRIPTAN SUCCINATE 50 MG PO TABS
50.0000 mg | ORAL_TABLET | ORAL | Status: DC | PRN
  Filled 2020-11-21: qty 1

## 2020-11-21 MED ORDER — SODIUM CHLORIDE 0.9 % IV SOLN
0.0125 ug/kg/min | INTRAVENOUS | Status: DC
  Filled 2020-11-21: qty 2000

## 2020-11-21 MED ORDER — PHENYLEPHRINE HCL-NACL 10-0.9 MG/250ML-% IV SOLN
INTRAVENOUS | Status: DC | PRN
  Administered 2020-11-21: 25 ug/min via INTRAVENOUS

## 2020-11-21 MED ORDER — SUGAMMADEX SODIUM 200 MG/2ML IV SOLN
INTRAVENOUS | Status: DC | PRN
  Administered 2020-11-21: 200 mg via INTRAVENOUS

## 2020-11-21 MED ORDER — ACETAMINOPHEN 650 MG RE SUPP: 650.0000 mg | RECTAL | Status: DC | PRN

## 2020-11-21 MED ORDER — FENTANYL CITRATE (PF) 100 MCG/2ML IJ SOLN
INTRAMUSCULAR | Status: AC
  Administered 2020-11-21: 50 ug via INTRAVENOUS
  Filled 2020-11-21: qty 2

## 2020-11-21 MED ORDER — PHENYLEPHRINE 40 MCG/ML (10ML) SYRINGE FOR IV PUSH (FOR BLOOD PRESSURE SUPPORT)
PREFILLED_SYRINGE | INTRAVENOUS | Status: AC
  Filled 2020-11-21: qty 10

## 2020-11-21 MED ORDER — PROPOFOL 500 MG/50ML IV EMUL
INTRAVENOUS | Status: DC | PRN
  Administered 2020-11-21: 25 ug/kg/min via INTRAVENOUS

## 2020-11-21 MED ORDER — HYDROCODONE-ACETAMINOPHEN 5-325 MG PO TABS
1.0000 | ORAL_TABLET | ORAL | Status: DC | PRN
  Administered 2020-11-21 – 2020-11-24 (×10): 1 via ORAL
  Filled 2020-11-21 (×10): qty 1

## 2020-11-21 MED ORDER — SODIUM CHLORIDE 0.9 % IV SOLN
0.1500 ug/kg/min | INTRAVENOUS | Status: DC
  Administered 2020-11-21: .15 ug/kg/min via INTRAVENOUS
  Administered 2020-11-21: .2 ug/kg/min via INTRAVENOUS
  Filled 2020-11-21 (×3): qty 2000

## 2020-11-21 MED ORDER — PHENYLEPHRINE 40 MCG/ML (10ML) SYRINGE FOR IV PUSH (FOR BLOOD PRESSURE SUPPORT)
PREFILLED_SYRINGE | INTRAVENOUS | Status: DC | PRN
  Administered 2020-11-21: 80 ug via INTRAVENOUS

## 2020-11-21 MED ORDER — CEFAZOLIN SODIUM-DEXTROSE 2-4 GM/100ML-% IV SOLN
2.0000 g | INTRAVENOUS | Status: AC
  Administered 2020-11-21: 2 g via INTRAVENOUS
  Filled 2020-11-21: qty 100

## 2020-11-21 MED ORDER — POLYETHYLENE GLYCOL 3350 17 G PO PACK: 17.0000 g | PACK | Freq: Every day | ORAL | Status: DC | PRN

## 2020-11-21 MED ORDER — CHLORHEXIDINE GLUCONATE CLOTH 2 % EX PADS: 6.0000 | MEDICATED_PAD | Freq: Once | CUTANEOUS | Status: DC

## 2020-11-21 MED ORDER — FENTANYL CITRATE (PF) 250 MCG/5ML IJ SOLN
INTRAMUSCULAR | Status: DC | PRN
  Administered 2020-11-21 (×2): 50 ug via INTRAVENOUS
  Administered 2020-11-21: 100 ug via INTRAVENOUS

## 2020-11-21 MED ORDER — THROMBIN 5000 UNITS EX SOLR
OROMUCOSAL | Status: DC | PRN
  Administered 2020-11-21: 5 mL via TOPICAL

## 2020-11-21 MED ORDER — LEVETIRACETAM 500 MG PO TABS
1000.0000 mg | ORAL_TABLET | Freq: Two times a day (BID) | ORAL | Status: DC
  Administered 2020-11-21 – 2020-11-24 (×7): 1000 mg via ORAL
  Filled 2020-11-21 (×7): qty 2

## 2020-11-21 MED ORDER — ONDANSETRON HCL 4 MG/2ML IJ SOLN: 4.0000 mg | INTRAMUSCULAR | Status: DC | PRN

## 2020-11-21 MED ORDER — SUCCINYLCHOLINE CHLORIDE 200 MG/10ML IV SOSY
PREFILLED_SYRINGE | INTRAVENOUS | Status: AC
  Filled 2020-11-21: qty 10

## 2020-11-21 MED ORDER — CEFAZOLIN SODIUM-DEXTROSE 2-4 GM/100ML-% IV SOLN
2.0000 g | Freq: Three times a day (TID) | INTRAVENOUS | Status: AC
  Administered 2020-11-21 – 2020-11-22 (×2): 2 g via INTRAVENOUS
  Filled 2020-11-21 (×3): qty 100

## 2020-11-21 MED ORDER — AMISULPRIDE (ANTIEMETIC) 5 MG/2ML IV SOLN: 10.0000 mg | Freq: Once | INTRAVENOUS | Status: DC | PRN

## 2020-11-21 MED ORDER — PROMETHAZINE HCL 25 MG/ML IJ SOLN: 6.2500 mg | INTRAMUSCULAR | Status: DC | PRN

## 2020-11-21 MED ORDER — ACETAMINOPHEN 10 MG/ML IV SOLN: 1000.0000 mg | Freq: Once | INTRAVENOUS | Status: DC | PRN

## 2020-11-21 MED ORDER — ONDANSETRON HCL 4 MG PO TABS: 4.0000 mg | ORAL_TABLET | ORAL | Status: DC | PRN

## 2020-11-21 MED ORDER — THROMBIN 20000 UNITS EX SOLR
CUTANEOUS | Status: AC
  Filled 2020-11-21: qty 20000

## 2020-11-21 MED ORDER — LABETALOL HCL 5 MG/ML IV SOLN: 10.0000 mg | INTRAVENOUS | Status: DC | PRN

## 2020-11-21 MED ORDER — HYDROMORPHONE HCL 1 MG/ML IJ SOLN
0.5000 mg | INTRAMUSCULAR | Status: DC | PRN
  Administered 2020-11-22 (×2): 0.5 mg via INTRAVENOUS
  Filled 2020-11-21 (×2): qty 1

## 2020-11-21 MED ORDER — LIDOCAINE-EPINEPHRINE 1 %-1:100000 IJ SOLN
INTRAMUSCULAR | Status: DC | PRN
  Administered 2020-11-21: 10 mL

## 2020-11-21 MED ORDER — ACETAMINOPHEN 10 MG/ML IV SOLN
INTRAVENOUS | Status: AC
  Filled 2020-11-21: qty 100

## 2020-11-21 MED ORDER — CHLORHEXIDINE GLUCONATE CLOTH 2 % EX PADS
6.0000 | MEDICATED_PAD | Freq: Every day | CUTANEOUS | Status: DC
  Administered 2020-11-23 – 2020-11-24 (×2): 6 via TOPICAL

## 2020-11-21 MED ORDER — PROPOFOL 10 MG/ML IV BOLUS
INTRAVENOUS | Status: AC
  Filled 2020-11-21: qty 20

## 2020-11-21 MED ORDER — PROPOFOL 10 MG/ML IV BOLUS
INTRAVENOUS | Status: DC | PRN
  Administered 2020-11-21: 50 mg via INTRAVENOUS
  Administered 2020-11-21: 150 mg via INTRAVENOUS

## 2020-11-21 MED ORDER — SODIUM CHLORIDE 0.9 % IV SOLN: INTRAVENOUS | Status: DC

## 2020-11-21 SURGICAL SUPPLY — 93 items
APL SKNCLS STERI-STRIP NONHPOA (GAUZE/BANDAGES/DRESSINGS)
BAG COUNTER SPONGE SURGICOUNT (BAG) ×4 IMPLANT
BAG SPNG CNTER NS LX DISP (BAG) ×4
BAND INSRT 18 STRL LF DISP RB (MISCELLANEOUS) ×4
BAND RUBBER #18 3X1/16 STRL (MISCELLANEOUS) ×2 IMPLANT
BENZOIN TINCTURE PRP APPL 2/3 (GAUZE/BANDAGES/DRESSINGS) IMPLANT
BLADE CLIPPER SURG (BLADE) ×3 IMPLANT
BLADE SAW GIGLI 16 STRL (MISCELLANEOUS) IMPLANT
BLADE SURG 15 STRL LF DISP TIS (BLADE) IMPLANT
BLADE SURG 15 STRL SS (BLADE)
BNDG CMPR 75X41 PLY HI ABS (GAUZE/BANDAGES/DRESSINGS)
BNDG GAUZE ELAST 4 BULKY (GAUZE/BANDAGES/DRESSINGS) IMPLANT
BNDG STRETCH 4X75 STRL LF (GAUZE/BANDAGES/DRESSINGS) IMPLANT
BUR ACORN 9.0 PRECISION (BURR) ×3 IMPLANT
BUR ROUND FLUTED 4 SOFT TCH (BURR) IMPLANT
BUR SPIRAL ROUTER 2.3 (BUR) ×3 IMPLANT
CANISTER SUCT 3000ML PPV (MISCELLANEOUS) ×6 IMPLANT
CATH VENTRIC 35X38 W/TROCAR LG (CATHETERS) IMPLANT
CLIP VESOCCLUDE MED 6/CT (CLIP) IMPLANT
CNTNR URN SCR LID CUP LEK RST (MISCELLANEOUS) ×2 IMPLANT
CONT SPEC 4OZ STRL OR WHT (MISCELLANEOUS) ×3
COVER BURR HOLE UNIV 10 (Orthopedic Implant) ×1 IMPLANT
COVER MAYO STAND STRL (DRAPES) IMPLANT
DECANTER SPIKE VIAL GLASS SM (MISCELLANEOUS) ×3 IMPLANT
DRAIN SUBARACHNOID (WOUND CARE) IMPLANT
DRAPE HALF SHEET 40X57 (DRAPES) ×3 IMPLANT
DRAPE MICROSCOPE LEICA (MISCELLANEOUS) ×1 IMPLANT
DRAPE NEUROLOGICAL W/INCISE (DRAPES) ×3 IMPLANT
DRAPE STERI IOBAN 125X83 (DRAPES) IMPLANT
DRAPE SURG 17X23 STRL (DRAPES) IMPLANT
DRAPE WARM FLUID 44X44 (DRAPES) ×3 IMPLANT
DRSG ADAPTIC 3X8 NADH LF (GAUZE/BANDAGES/DRESSINGS) IMPLANT
DRSG TELFA 3X8 NADH (GAUZE/BANDAGES/DRESSINGS) IMPLANT
DURAPREP 6ML APPLICATOR 50/CS (WOUND CARE) ×3 IMPLANT
ELECT REM PT RETURN 9FT ADLT (ELECTROSURGICAL) ×3
ELECTRODE REM PT RTRN 9FT ADLT (ELECTROSURGICAL) ×2 IMPLANT
EVACUATOR 1/8 PVC DRAIN (DRAIN) IMPLANT
EVACUATOR SILICONE 100CC (DRAIN) IMPLANT
FORCEPS BIPOLAR SPETZLER 8 1.0 (NEUROSURGERY SUPPLIES) ×3 IMPLANT
GAUZE 4X4 16PLY ~~LOC~~+RFID DBL (SPONGE) ×2 IMPLANT
GAUZE SPONGE 4X4 12PLY STRL (GAUZE/BANDAGES/DRESSINGS) IMPLANT
GLOVE EXAM NITRILE LRG STRL (GLOVE) IMPLANT
GLOVE EXAM NITRILE XS STR PU (GLOVE) IMPLANT
GLOVE SURG ENC MOIS LTX SZ7 (GLOVE) ×2 IMPLANT
GLOVE SURG LTX SZ7.5 (GLOVE) ×3 IMPLANT
GLOVE SURG UNDER POLY LF SZ7 (GLOVE) ×3 IMPLANT
GLOVE SURG UNDER POLY LF SZ7.5 (GLOVE) ×5 IMPLANT
GOWN STRL REUS W/ TWL LRG LVL3 (GOWN DISPOSABLE) ×4 IMPLANT
GOWN STRL REUS W/ TWL XL LVL3 (GOWN DISPOSABLE) IMPLANT
GOWN STRL REUS W/TWL 2XL LVL3 (GOWN DISPOSABLE) IMPLANT
GOWN STRL REUS W/TWL LRG LVL3 (GOWN DISPOSABLE) ×6
GOWN STRL REUS W/TWL XL LVL3 (GOWN DISPOSABLE) ×6
HEMOSTAT POWDER KIT SURGIFOAM (HEMOSTASIS) ×3 IMPLANT
HEMOSTAT SURGICEL 2X14 (HEMOSTASIS) ×3 IMPLANT
IV NS 1000ML (IV SOLUTION)
IV NS 1000ML BAXH (IV SOLUTION) ×2 IMPLANT
KIT BASIN OR (CUSTOM PROCEDURE TRAY) ×3 IMPLANT
KIT DRAIN CSF ACCUDRAIN (MISCELLANEOUS) IMPLANT
KIT TURNOVER KIT B (KITS) ×3 IMPLANT
MARKER SPHERE PSV REFLC 13MM (MARKER) ×9 IMPLANT
NDL SPNL 18GX3.5 QUINCKE PK (NEEDLE) IMPLANT
NEEDLE HYPO 22GX1.5 SAFETY (NEEDLE) ×3 IMPLANT
NEEDLE SPNL 18GX3.5 QUINCKE PK (NEEDLE) IMPLANT
NS IRRIG 1000ML POUR BTL (IV SOLUTION) ×9 IMPLANT
PACK CRANIOTOMY CUSTOM (CUSTOM PROCEDURE TRAY) ×3 IMPLANT
PAD DRESSING TELFA 3X8 NADH (GAUZE/BANDAGES/DRESSINGS) IMPLANT
PATTIES SURGICAL .25X.25 (GAUZE/BANDAGES/DRESSINGS) IMPLANT
PATTIES SURGICAL .5 X.5 (GAUZE/BANDAGES/DRESSINGS) IMPLANT
PATTIES SURGICAL .5 X3 (DISPOSABLE) IMPLANT
PATTIES SURGICAL 1/4 X 3 (GAUZE/BANDAGES/DRESSINGS) IMPLANT
PATTIES SURGICAL 1X1 (DISPOSABLE) IMPLANT
PIN MAYFIELD SKULL DISP (PIN) ×3 IMPLANT
PLATE DOUBLE Y CMF 6H (Plate) ×2 IMPLANT
SCREW UNIII AXS SD 1.5X4 (Screw) ×14 IMPLANT
SPECIMEN JAR SMALL (MISCELLANEOUS) ×1 IMPLANT
SPONGE NEURO XRAY DETECT 1X3 (DISPOSABLE) IMPLANT
SPONGE SURGIFOAM ABS GEL 100 (HEMOSTASIS) ×3 IMPLANT
SPONGE T-LAP 4X18 ~~LOC~~+RFID (SPONGE) ×1 IMPLANT
STAPLER VISISTAT 35W (STAPLE) ×3 IMPLANT
SUT ETHILON 3 0 FSL (SUTURE) IMPLANT
SUT ETHILON 3 0 PS 1 (SUTURE) IMPLANT
SUT MNCRL AB 3-0 PS2 18 (SUTURE) IMPLANT
SUT MON AB 3-0 SH 27 (SUTURE)
SUT MON AB 3-0 SH27 (SUTURE) IMPLANT
SUT NURALON 4 0 TR CR/8 (SUTURE) ×7 IMPLANT
SUT SILK 0 TIES 10X30 (SUTURE) IMPLANT
SUT VIC AB 2-0 CP2 18 (SUTURE) ×5 IMPLANT
TOWEL GREEN STERILE (TOWEL DISPOSABLE) ×3 IMPLANT
TOWEL GREEN STERILE FF (TOWEL DISPOSABLE) ×3 IMPLANT
TRAY FOLEY MTR SLVR 16FR STAT (SET/KITS/TRAYS/PACK) ×3 IMPLANT
TUBE CONNECTING 12X1/4 (SUCTIONS) ×3 IMPLANT
UNDERPAD 30X36 HEAVY ABSORB (UNDERPADS AND DIAPERS) ×2 IMPLANT
WATER STERILE IRR 1000ML POUR (IV SOLUTION) ×3 IMPLANT

## 2020-11-21 NOTE — Anesthesia Procedure Notes (Addendum)
Arterial Line Insertion Start/End7/19/2022 7:05 AM, 11/21/2020 7:15 AM Performed by: Reece Agar, CRNA, CRNA  Patient location: Pre-op. Preanesthetic checklist: patient identified, IV checked, site marked, risks and benefits discussed, surgical consent, monitors and equipment checked, pre-op evaluation, timeout performed and anesthesia consent Lidocaine 1% used for infiltration Right, radial was placed Catheter size: 20 G Hand hygiene performed , maximum sterile barriers used  and Seldinger technique used  Attempts: 1 Procedure performed without using ultrasound guided technique. Following insertion, dressing applied and Biopatch. Post procedure assessment: normal and unchanged  Patient tolerated the procedure well with no immediate complications.

## 2020-11-21 NOTE — Op Note (Signed)
PATIENT: [name] OF SURGERY: 11/21/20   PRE-OPERATIVE DIAGNOSIS:  Left temporal brain tumor   POST-OPERATIVE DIAGNOSIS:  Same   PROCEDURE:  Left craniotomy for tumor resection   SURGEON:  Surgeon(s) and Role:    Judith Part, MD - Primary    Erline Levine, MD - Assisting   ANESTHESIA: ETGA   BRIEF HISTORY: This is a 55 year old woman who presented with new onset seizures. The patient was found to have a large enhancing left temporal mass. I therefore recommended resection. This was discussed with the patient as well as risks, benefits, and alternatives and wished to proceed with surgery.   OPERATIVE DETAIL: The patient was taken to the operating room and placed on the OR table in the supine position. A formal time out was performed with two patient identifiers and confirmed the operative site. Anesthesia was induced by the anesthesia team. The [place] head holder was applied to the head and a registration array was attached to the [employer]. This was co-registered with the patient's preoperative imaging, the fit appeared to be acceptable. Using frameless stereotaxy, the operative trajectory was planned and the incision was marked. Hair was clipped with surgical clippers over the incision and the area was then prepped and draped in a sterile fashion.  A curvilinear incision was placed on the left side anterior to the tragus. A craniotomy was performed to expose the left temporal lobe, landmarks were checked visually as well as using stereotaxy. A neocortical incision was made superiorly in the superior temporal sulcus and posteriorly at the posterior aspect of the tumor, 4cm posterior to the temporal tip. This was greater than 3.5cm but it was required to go further posterior given the pathology. A small piece of tumor was taken and sent for frozen, which was non-diagnostic but did have hypercellularity and necrosis.  Given the bulk of the tumor and mass effect on the  temporal horn, I could not access it early as I would have liked. Given the inability to access the ventricle early, I was concerned about preserving the anterior choroidal. I therefore dissected around the tumor posteriorly and superiorly to allow for some mobilization of the temporal lobe off the floor of the middle fossa. It was very adherent to the floor, especially anteriorly.   I was able to dissect the bulk of the tumor free with the remainder of the neocortex and removed them together with the exception of some medial tumor that was adherent to the mesial temporal structures. This tissue was sent to pathology for permanent.   With neocortex and the majority of the tumor removed, attention was turned to the residual mesial tumor and mesial temporal lobe structures. At this point I was able to access the ventricle to locate and protect the anterior choroidal as well as identify the inferior choroidal point to allow for landmarks in the ventral dissection. The inferior choroidal point was identified and medial dissection was taken to identify an M2 branch for landmarks. The plane between these two was then used as a medial border during the amygdalectomy, which was then completed. The tumor was very adherent to the Sylvian fissure anteriorly. I removed as much as I thought was safe in this region without violating the Sylvian fissure arachnoid.   The hippocampus was then removed in a subpial fashion. The pia appeared to be invaded in some places, but as I got more medial it returned to more normal appearing tissue and I was able to  dissect off the mesial structures well, identifying the PCA and oculomotor through the medial pia but without violating it. This was confirmed with stereotaxy. The margins were inspected visually and with stereotaxy with a good resection apparent from those findings.  Hemostasis was confirmed x2, the wound was copiously irrigated, the operative area was examined visually and  with stereotaxy to evaluate for any residual tumor without any clear residual, the dura was closed with suture, and the bone flap was replaced with titanium plates and screws.  All instrument and sponge counts were correct, the incision was then closed in layers. The patient was then returned to anesthesia for emergence. No apparent complications at the completion of the procedure.   EBL:  356mL   DRAINS: none   SPECIMENS: Left temporal brain tumor   Judith Part, MD 11/21/20 7:31 AM

## 2020-11-21 NOTE — Anesthesia Procedure Notes (Addendum)
Procedure Name: Intubation Date/Time: 11/21/2020 7:59 AM Performed by: Reece Agar, CRNA Pre-anesthesia Checklist: Patient identified, Emergency Drugs available, Suction available and Patient being monitored Patient Re-evaluated:Patient Re-evaluated prior to induction Oxygen Delivery Method: Circle System Utilized Preoxygenation: Pre-oxygenation with 100% oxygen Induction Type: Rapid sequence, IV induction and Cricoid Pressure applied Ventilation: Mask ventilation without difficulty Laryngoscope Size: Glidescope and 3 Grade View: Grade I Tube type: Oral Laser Tube: Cuffed inflated with minimal occlusive pressure - saline Tube size: 7.0 mm Number of attempts: 1 Airway Equipment and Method: Stylet Placement Confirmation: ETT inserted through vocal cords under direct vision, positive ETCO2 and breath sounds checked- equal and bilateral Secured at: 21 cm Tube secured with: Tape Dental Injury: Teeth and Oropharynx as per pre-operative assessment  Comments: Glide used due to patient being COVID positive

## 2020-11-21 NOTE — Progress Notes (Signed)
Dr. Zada Finders and Dr. Roanna Banning aware of patient's covid test result.  Per Dr. Zada Finders, we will proceed with the case today.

## 2020-11-21 NOTE — Transfer of Care (Signed)
Immediate Anesthesia Transfer of Care Note  Patient: [name]  Procedure(s) Performed: Left craniotomy for tumor resection (Left: Head) APPLICATION OF CRANIAL NAVIGATION (Head)  Patient Location: PACU  Anesthesia Type:General  Level of Consciousness: awake and alert   Airway & Oxygen Therapy: Patient Spontanous Breathing and Patient connected to face mask oxygen  Post-op Assessment: Report given to RN, Post -op Vital signs reviewed and stable and Patient moving all extremities X 4  Post vital signs: Reviewed and stable  Last Vitals:  Vitals Value Taken Time  BP 122/74 11/21/20 1245  Temp    Pulse 94 11/21/20 1245  Resp 20 11/21/20 1245  SpO2 96 % 11/21/20 1245  Vitals shown include unvalidated device data.  Last Pain:  Vitals:   11/21/20 0644  TempSrc:   PainSc: 0-No pain         Complications: No notable events documented.

## 2020-11-21 NOTE — H&P (Signed)
Surgical H&P Update  HPI: 55 y.o. woman that presented with a seizure, found to have a left temporal mass. She was discharged over the weekend, while home she denies any other seizures / auras, but is still having some right sided facial numbness still. No new symptoms.   PMHx:  Past Medical History:  Diagnosis Date   Bell's palsy    left side of face- notied in smile and eyes, if very tired   Complication of anesthesia    N/V   Essential hypertension    External hemorrhoids    History of blood transfusion    post op Hemorroids   History of pneumonia    Hyperlipidemia    Hyperthyroidism    hx 55 years old- oral meds   Iron deficiency anemia    Migraine headache    Pneumonia    x 2 - years ago   PONV (postoperative nausea and vomiting)    PPD positive    Pulmonary nodules    not seen on plain cxr, first detected 1/07 re ct 10/08 pos IPPD > 15 mm 12/09 FOB/lavage 04/20/08 neg afb smear   PVC's (premature ventricular contractions)    Seizure ([place]) 11/15/2020   Sleep apnea    refused CPAP- mouth piece recommended   Tuberculosis    in her late 53's   FamHx:  Family History  Problem Relation Age of Onset   Heart disease Maternal Grandmother        aunts, uncles, sister   Diabetes Mellitus II Maternal Grandmother    Kidney failure Sister    Diabetes Mellitus II Sister    Lung cancer Other        aunts and uncles   Colon cancer Maternal Aunt        aunts and uncles   Diabetes Mellitus II Maternal Aunt    Colon cancer Maternal Uncle    Diabetes Mellitus II Maternal Uncle    Diabetes type II Mother    SocHx:  reports that she has never smoked. She has never used smokeless tobacco. She reports that she does not drink alcohol and does not use drugs.  Physical Exam: AOx3, PERRL, L HB2 facial palsy, intact on R w/ dec'd sensation on R hemiface in [facility] pattern, TM  Strength 5/5 x4, SILTx4  Assesment/Plan: 55 y.o. woman with left temporal brain tumor, here for resection.  Risks, benefits, and alternatives discussed and the patient would like to continue with surgery.  -OR today -4N ICU post-op  Judith Part, MD 11/21/20 7:26 AM

## 2020-11-22 ENCOUNTER — Other Ambulatory Visit: Payer: Self-pay | Admitting: Radiation Therapy

## 2020-11-22 ENCOUNTER — Inpatient Hospital Stay (HOSPITAL_COMMUNITY): Payer: BC Managed Care – PPO

## 2020-11-22 ENCOUNTER — Encounter (HOSPITAL_COMMUNITY): Payer: Self-pay | Admitting: Neurological Surgery

## 2020-11-22 LAB — SURGICAL PATHOLOGY

## 2020-11-22 MED ORDER — GADOBUTROL 1 MMOL/ML IV SOLN
8.0000 mL | Freq: Once | INTRAVENOUS | Status: AC | PRN
  Administered 2020-11-22: 8 mL via INTRAVENOUS

## 2020-11-22 NOTE — Evaluation (Signed)
Occupational Therapy Evaluation Patient Details Name: [name] MRN: [id number] DOB: Sep 20, 1965 Today's Date: 11/22/2020    History of Present Illness 55 y/o female presented 7/19 for L crani for resection of L temporal brain tumor. Of note, pt presented to ED on 7/13 with complaints of episode of loss of consciousness with incontinence, suspecting seizures. 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 MRI abdomen show subtle lesion in inferior aspect of pancreatic head. PMH: Bell's palsy with chronic L facial droop, HTN, chronic hypokalemia, hypothyroidism, asthma.   Clinical Impression   Pt admitted with above. She demonstrates the below listed deficits and will benefit from continued OT to maximize safety and independence with BADLs.  Pt presents to OT with generalized weakness, mild balance deficits, and increased pain which limits her functional performance.  She currently requires min - mod A for ADLs.  She reports she lives with her daughter and was fully independent with ADLs PTA, including working full time.      Follow Up Recommendations  Home health OT;Supervision/Assistance - 24 hour (initially)    Equipment Recommendations  None recommended by OT    Recommendations for Other Services       Precautions / Restrictions Precautions Precautions: Fall Precaution Comments: COVID Restrictions Weight Bearing Restrictions: No      Mobility Bed Mobility Overal bed mobility: Modified Independent             General bed mobility comments: required HOB to be elevated due to increased pian    Transfers Overall transfer level: Needs assistance Equipment used: 1 person hand held assist;None Transfers: Sit to/from [facility] Sit to Stand: Min assist;Min guard Stand pivot transfers: Min assist       General transfer comment: pt initially required min A to steady upon standing, but then progressed  to min guard assist    Balance Overall balance assessment: Mild deficits observed, not formally tested                                         ADL either performed or assessed with clinical judgement   ADL Overall ADL's : Needs assistance/impaired Eating/Feeding: Independent   Grooming: Wash/dry hands;Min guard;Standing   Upper Body Bathing: Moderate assistance;Sitting   Lower Body Bathing: Sit to/from stand;Maximal assistance   Upper Body Dressing : Moderate assistance;Sitting   Lower Body Dressing: Maximal assistance;Sit to/from stand   Toilet Transfer: Minimal assistance;Ambulation;Comfort height toilet;Grab bars Toilet Transfer Details (indicate cue type and reason): assist to steady Toileting- Clothing Manipulation and Hygiene: Min guard;Sit to/from stand       Functional mobility during ADLs: Minimal assistance General ADL Comments: Pt moves slowly, and is guarded     Vision Baseline Vision/History:  (Pt reports she wears glasses when she needs them but unable to tell me when that is) Additional Comments: unable to accurately assess due to headache     Perception Perception Perception Tested?: Yes   Praxis Praxis Praxis tested?: Within functional limits    Pertinent Vitals/Pain Pain Assessment: 0-10 Pain Score: 7  Pain Location: headache Pain Descriptors / Indicators: Headache Pain Intervention(s): Limited activity within patient's tolerance;Monitored during session;Repositioned;Patient requesting pain meds-RN notified     Hand Dominance Right   Extremity/Trunk Assessment Upper Extremity Assessment Upper Extremity Assessment: Overall WFL for tasks assessed   Lower Extremity Assessment Lower Extremity Assessment: Overall WFL for  tasks assessed   Cervical / Trunk Assessment Cervical / Trunk Assessment: Normal   Communication Communication Communication: No difficulties   Cognition Arousal/Alertness: Awake/alert Behavior During  Therapy: Flat affect Overall Cognitive Status: Difficult to assess                                 General Comments: Pt is slow to initiate and respond.  Unable to accurately assess due to increased pain   General Comments  VSS    Exercises     Shoulder Instructions      Home Living Family/patient expects to be discharged to:: Private residence Living Arrangements: Children Available Help at Discharge: Family;Available 24 hours/day Type of Home: House Home Access: Stairs to enter [employer] of Steps: 2 Entrance Stairs-Rails: Can reach both Home Layout: One level     Bathroom Shower/Tub: Teacher, early years/pre: Standard     Home Equipment: None          Prior Functioning/Environment Level of Independence: Independent        Comments: 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        OT Problem List: Decreased activity tolerance;Impaired balance (sitting and/or standing);Impaired vision/perception;Decreased cognition;Decreased safety awareness;Pain      OT Treatment/Interventions: Self-care/ADL training;DME and/or AE instruction;Therapeutic activities;Cognitive remediation/compensation;Visual/perceptual remediation/compensation;Patient/family education;Balance training    OT Goals(Current goals can be found in the care plan section) Acute Rehab OT Goals Patient Stated Goal: to have less pain OT Goal Formulation: With patient Time For Goal Achievement: 12/06/20 Potential to Achieve Goals: Good ADL Goals Pt Will Perform Grooming: with modified independence;standing Pt Will Perform Upper Body Bathing: with modified independence;standing;sitting Pt Will Perform Lower Body Bathing: with modified independence;sit to/from stand Pt Will Perform Upper Body Dressing: with modified independence;sitting;standing Pt Will Perform Lower Body Dressing: with modified independence;sit to/from stand Pt Will  Transfer to Toilet: with modified independence;ambulating;regular height toilet;bedside commode;grab bars Additional ADL Goal #1: Pt will be able to perform moderately challenging path finding task mod I Additional ADL Goal #2: Pt will complete multi step commands independently  OT Frequency: Min 2X/week   Barriers to D/C:            Co-evaluation              AM-PAC OT "6 Clicks" Daily Activity     Outcome Measure Help from another person eating meals?: None Help from another person taking care of personal grooming?: A Little Help from another person toileting, which includes using toliet, bedpan, or urinal?: A Little Help from another person bathing (including washing, rinsing, drying)?: A Lot Help from another person to put on and taking off regular upper body clothing?: A Lot Help from another person to put on and taking off regular lower body clothing?: A Lot 6 Click Score: 16   End of Session Nurse Communication: Mobility status;Patient requests pain meds  Activity Tolerance: Patient limited by pain Patient left: in bed;with call bell/phone within reach;with bed alarm set  OT Visit Diagnosis: Unsteadiness on feet (R26.81);Pain Pain - part of body:  (head)                Time: 5093-2671 OT Time Calculation (min): 27 min Charges:  OT General Charges $OT Visit: 1 Visit OT Evaluation $OT Eval Moderate Complexity: 1 Mod OT Treatments $Self Care/Home Management : 8-22 mins  Saraih Lorton C., OTR/L Acute Rehabilitation Services Pager  [phone] Office [phone]   Lucille Passy M 11/22/2020, 3:38 PM

## 2020-11-22 NOTE — Progress Notes (Signed)
Neurosurgery Service Progress Note  Subjective: No acute events overnight. Mild to moderate headache, no N/V, no visual complaints or new neurologic complaints   Objective: Vitals:   11/22/20 0300 11/22/20 0400 11/22/20 0500 11/22/20 0600  BP: (!) 89/59 (!) 91/55 (!) 86/51 (!) 88/59  Pulse: (!) 45 (!) 46 (!) 53 (!) 49  Resp: 13 11 13 13   Temp:      TempSrc:      SpO2: 93% 96% 93% 95%  Weight:      Height:        Physical Exam: AOx3, PERRL, L HB2 facial palsy, intact on R w/ dec'd sensation on R hemiface in UMN pattern, TM Strength 5/5 x4, SILTx4 Incision c/d/i  Assessment & Plan: 55 y.o. woman s/p left craniotomy for resection of temporal lobe mass, recovering well. Post-op MRI with good resection, small residual, 97% resection by volume, no infarcts.  -transfer to floor -prelim path had necrosis and some hypercellularity, final path pending. Given the prelim, I think we can hold off on biopsy of the pancreatic mass until final path is returned.  -PT/OT -SCDs/TEDs, [facility] tomorrow  Joyice Faster Garry Bochicchio  11/22/20 9:02 AM

## 2020-11-22 NOTE — Evaluation (Signed)
Physical Therapy Evaluation Patient Details Name: [name] MRN: [id number] DOB: Apr 15, 1966 Today's Date: 11/22/2020   History of Present Illness  55 y/o female presented 7/19 for L crani for resection of L temporal brain tumor. Of note, pt presented to ED on 7/13 with complaints of episode of loss of consciousness with incontinence, suspecting seizures. 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 MRI abdomen show subtle lesion in inferior aspect of pancreatic head. PMH: Bell's palsy with chronic L facial droop, HTN, chronic hypokalemia, hypothyroidism, asthma.   Clinical Impression  Pt presents with condition above and deficits mentioned below, see PT Problem List. PTA, she was independent with all functional mobility. Pt's adult daughter lives with her in a 1-level house with 2 STE with bil handrails. 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 Currently, pt displays some mild deficits in lower extremity strength bil (primarily with hip flexion), balance, and activity tolerance. Pt ambulates at a very slow pace and hesitantly, initially desiring for at least 1 UE support for stability. However, she was able to progress away from using an UE for stability and was able to ambulate in the room without LOB or physical assistance. Pt limited to room mobility due to being on COVID precautions. Expect pt will progress quickly back to baseline, thus all PT needs can be addressed acutely.    Follow Up Recommendations No PT follow up    Equipment Recommendations  None recommended by PT    Recommendations for Other Services       Precautions / Restrictions Precautions Precautions: Fall Precaution Comments: COVID Restrictions Weight Bearing Restrictions: No      Mobility  Bed Mobility Overal bed mobility: Independent             General bed mobility comments: Pt able to transition supine > sit R EOB with  bed flat safely with extra time.    Transfers Overall transfer level: Needs assistance Equipment used: 1 person hand held assist;None Transfers: Sit to/from [facility] Sit to Stand: Min assist;Min guard Stand pivot transfers: Min assist       General transfer comment: Pt initially requesting for UE support minA to power up to stand and take side steps to R to recliner, but quickly progressed to only needing min guard assist for safety, no LOB.  Ambulation/Gait Ambulation/Gait assistance: Min assist;Min guard Gait Distance (Feet): 45 Feet Assistive device: 1 person hand held assist;None Gait Pattern/deviations: Step-through pattern;Decreased stride length;Narrow base of support;Trunk flexed Gait velocity: reduced Gait velocity interpretation: <1.31 ft/sec, indicative of household ambulator General Gait Details: Pt initially requesting UE support or reaching for objects for UE support but able to progress to no UE support to ambulate without LOB. Pt with slow, hesitant gait, min guard assist.  Stairs            Wheelchair Mobility    Modified Rankin (Stroke Patients Only) Modified Rankin (Stroke Patients Only) Pre-Morbid Rankin Score: No symptoms Modified Rankin: Moderately severe disability     Balance Overall balance assessment: Mild deficits observed, not formally tested                                           Pertinent Vitals/Pain Pain Assessment: 0-10 Pain Score: 5  Pain Location: headache Pain Descriptors / Indicators: Headache Pain Intervention(s): Limited activity within  patient's tolerance;Monitored during session;Repositioned    Home Living Family/patient expects to be discharged to:: Private residence Living Arrangements: Children Available Help at Discharge: Family;Available 24 hours/day Type of Home: House Home Access: Stairs to enter Entrance Stairs-Rails: Can reach both Entrance Stairs-Number of Steps: 2 Home  Layout: One level Home Equipment: None      Prior Function Level of Independence: Independent         Comments: 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     Hand Dominance   Dominant Hand: Right    Extremity/Trunk Assessment   Upper Extremity Assessment Upper Extremity Assessment: Defer to OT evaluation    Lower Extremity Assessment Lower Extremity Assessment: Overall WFL for tasks assessed (MMT scores of 4 to 5 grossly, no dysdiadochokinesia or dysmetria noted, denies numbness/tingling)    Cervical / Trunk Assessment Cervical / Trunk Assessment: Normal  Communication   Communication: No difficulties  Cognition Arousal/Alertness: Awake/alert Behavior During Therapy: Flat affect Overall Cognitive Status: Within Functional Limits for tasks assessed                                 General Comments: Pt awake, keeping eyes open throughout, but reportedly and apparently lethargic with flat affect and slow movement. A&Ox4, follows all commands appropriately.      General Comments General comments (skin integrity, edema, etc.): VSS on RA    Exercises     Assessment/Plan    PT Assessment Patient needs continued PT services  PT Problem List Decreased activity tolerance;Decreased balance;Decreased strength;Decreased mobility;Decreased safety awareness       PT Treatment Interventions DME instruction;Gait training;Stair training;Functional mobility training;Therapeutic activities;Therapeutic exercise;Balance training;Neuromuscular re-education;Patient/family education    PT Goals (Current goals can be found in the Care Plan section)  Acute Rehab PT Goals Patient Stated Goal: to get better and go home PT Goal Formulation: With patient Time For Goal Achievement: 12/06/20 Potential to Achieve Goals: Good    Frequency Min 3X/week   Barriers to discharge        Co-evaluation               AM-PAC PT "6 Clicks"  Mobility  Outcome Measure Help needed turning from your back to your side while in a flat bed without using bedrails?: None Help needed moving from lying on your back to sitting on the side of a flat bed without using bedrails?: None Help needed moving to and from a bed to a chair (including a wheelchair)?: A Little Help needed standing up from a chair using your arms (e.g., wheelchair or bedside chair)?: A Little Help needed to walk in hospital room?: A Little Help needed climbing 3-5 steps with a railing? : A Little 6 Click Score: 20    End of Session Equipment Utilized During Treatment: Gait belt Activity Tolerance: Patient tolerated treatment well Patient left: in chair;with call bell/phone within reach Nurse Communication: Mobility status;Other (comment) (pt verbalized understanding to call for nursing to get out of chair) PT Visit Diagnosis: Unsteadiness on feet (R26.81);Other abnormalities of gait and mobility (R26.89);Difficulty in walking, not elsewhere classified (R26.2)    Time: 1207-1228 PT Time Calculation (min) (ACUTE ONLY): 21 min   Charges:   PT Evaluation $PT Eval Low Complexity: 1 Low          Moishe Spice, PT, DPT Acute Rehabilitation Services  Pager: [phone] Office: [phone]   Orvan Falconer 11/22/2020, 2:40 PM

## 2020-11-22 NOTE — Anesthesia Postprocedure Evaluation (Signed)
Anesthesia Post Note  Patient: [name]  Procedure(s) Performed: Left craniotomy for tumor resection (Left: Head) APPLICATION OF CRANIAL NAVIGATION (Head)     Patient location during evaluation: PACU Anesthesia Type: General Level of consciousness: awake Pain management: pain level controlled Vital Signs Assessment: post-procedure vital signs reviewed and stable Respiratory status: spontaneous breathing, nonlabored ventilation, respiratory function stable and patient connected to nasal cannula oxygen Cardiovascular status: blood pressure returned to baseline and stable Postop Assessment: no apparent nausea or vomiting Anesthetic complications: no   No notable events documented.  Last Vitals:  Vitals:   11/22/20 0500 11/22/20 0600  BP: (!) 86/51 (!) 88/59  Pulse: (!) 53 (!) 49  Resp: 13 13  Temp:    SpO2: 93% 95%    Last Pain:  Vitals:   11/22/20 0600  TempSrc:   PainSc: 3                  [name] P [name]

## 2020-11-23 NOTE — Progress Notes (Signed)
Neurosurgery Service Progress Note  Subjective: No acute events overnight. Headaches improved but still present, still feels wiped out, L eye swelled shut yesterday, no N/V, no visual complaints or new neurologic complaints   Objective: Vitals:   11/22/20 2033 11/23/20 0006 11/23/20 0400 11/23/20 0829  BP: 121/62 110/62 116/64 111/71  Pulse: 65 72  (!) 58  Resp: 19  19 16   Temp: 99.4 F (37.4 C) 98.6 F (37 C) 99.1 F (37.3 C) 99.1 F (37.3 C)  TempSrc: Oral Oral Oral Oral  SpO2: 99% 95% 97% 93%  Weight:      Height:        Physical Exam: AOx3, PERRL, L HB2 facial palsy, intact on R w/ dec'd sensation on R hemiface in UMN pattern, +L periorbital edema, TM Strength 5/5 x4, SILTx4 Incision c/d/i  Assessment & Plan: 55 y.o. woman s/p left craniotomy for resection of temporal lobe mass, recovering well. Post-op MRI with good resection, small residual, 97% resection by volume, no infarcts.  -PT/OT -SCDs/TEDs, [facility] -likely discharge home tomorrow  Judith Part  11/23/20 9:10 AM

## 2020-11-23 NOTE — Plan of Care (Signed)

## 2020-11-23 NOTE — Progress Notes (Signed)
Physical Therapy Treatment Patient Details Name: [name] MRN: [id number] DOB: 1966-02-12 Today's Date: 11/23/2020    History of Present Illness 55 y/o female presented 7/19 for L crani for resection of L temporal brain tumor. Of note, pt presented to ED on 7/13 with complaints of episode of loss of consciousness with incontinence, suspecting seizures. 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 MRI abdomen show subtle lesion in inferior aspect of pancreatic head. PMH: Bell's palsy with chronic L facial droop, HTN, chronic hypokalemia, hypothyroidism, asthma.    PT Comments    Patient limited by fatigue this session with short mobility bouts. Patient with increased pain at incision site requiring frequent rest breaks between transitions. Patient ambulated 20' x 2 with HHAx1 and min guard. Patient demos cautious slow gait speed and unsteadiness. Continue to recommend no PT follow up at this time. Anticipate patient will progress quickly once pain is managed adequately.     Follow Up Recommendations  No PT follow up     Equipment Recommendations  None recommended by PT    Recommendations for Other Services       Precautions / Restrictions Precautions Precautions: Fall Precaution Comments: COVID Restrictions Weight Bearing Restrictions: No    Mobility  Bed Mobility Overal bed mobility: Modified Independent                  Transfers Overall transfer level: Needs assistance Equipment used: 1 person hand held assist Transfers: Sit to/from Stand Sit to Stand: Min assist         General transfer comment: minA to rise and steady with HHAx1 for comfort.  Ambulation/Gait Ambulation/Gait assistance: Min guard Gait Distance (Feet): 20 Feet (x20') Assistive device: 1 person hand held assist Gait Pattern/deviations: Step-through pattern;Decreased stride length;Narrow base of support;Trunk flexed Gait velocity:  decreased   General Gait Details: requesting HHA for support due to unsteadiness. Increased fatigue following 20' bout in room, required seated rest break. Able to ambulate additional 20' with HHAx1 and min guard but continues to demonstrate slow gait speed   Stairs             Wheelchair Mobility    Modified Rankin (Stroke Patients Only)       Balance Overall balance assessment: Mild deficits observed, not formally tested                                          Cognition Arousal/Alertness: Awake/alert Behavior During Therapy: Flat affect Overall Cognitive Status: Within Functional Limits for tasks assessed                                 General Comments: follows all commands appropriately. Lethargic with increased pain      Exercises      General Comments        Pertinent Vitals/Pain Pain Assessment: 0-10 Pain Score: 5  Pain Location: headache Pain Descriptors / Indicators: Headache Pain Intervention(s): Monitored during session;Repositioned;Limited activity within patient's tolerance    Home Living                      Prior Function            PT Goals (current goals can now be found in the care plan section) Acute Rehab PT  Goals Patient Stated Goal: to have less pain PT Goal Formulation: With patient Time For Goal Achievement: 12/06/20 Potential to Achieve Goals: Good Progress towards PT goals: Progressing toward goals    Frequency    Min 3X/week      PT Plan Current plan remains appropriate    Co-evaluation              AM-PAC PT "6 Clicks" Mobility   Outcome Measure  Help needed turning from your back to your side while in a flat bed without using bedrails?: None Help needed moving from lying on your back to sitting on the side of a flat bed without using bedrails?: None Help needed moving to and from a bed to a chair (including a wheelchair)?: A Little Help needed standing up from a  chair using your arms (e.g., wheelchair or bedside chair)?: A Little Help needed to walk in hospital room?: A Little Help needed climbing 3-5 steps with a railing? : A Little 6 Click Score: 20    End of Session Equipment Utilized During Treatment: Gait belt Activity Tolerance: Patient tolerated treatment well Patient left: in bed;with call bell/phone within reach Nurse Communication: Mobility status PT Visit Diagnosis: Unsteadiness on feet (R26.81);Other abnormalities of gait and mobility (R26.89);Difficulty in walking, not elsewhere classified (R26.2)     Time: 1624-4695 PT Time Calculation (min) (ACUTE ONLY): 21 min  Charges:  $Gait Training: 8-22 mins                     Kalik Hoare A. Gilford Rile PT, DPT Acute Rehabilitation Services Pager [phone] Office [phone]    Linna Hoff 11/23/2020, 5:13 PM

## 2020-11-24 MED ORDER — HYDROCODONE-ACETAMINOPHEN 5-325 MG PO TABS: 1.0000 | ORAL_TABLET | ORAL | 0 refills | Status: DC | PRN

## 2020-11-24 NOTE — TOC Transition Note (Signed)
Transition of Care [facility]) - CM/SW Discharge Note   Patient Details  Name: [name] MRN: [id number] Date of Birth: [birth year]  Transition of Care [facility]) CM/SW Contact:  Pollie Friar, RN Phone Number: 11/24/2020, 9:59 AM   Clinical Narrative:    Patient is discharging home with self care. No needs per TOC. Pt has transportation home.   Final next level of care: Home/Self Care Barriers to Discharge: No Barriers Identified   Patient Goals and CMS Choice        Discharge Placement                       Discharge Plan and Services                                     Social Determinants of Health (SDOH) Interventions     Readmission Risk Interventions No flowsheet data found.

## 2020-11-24 NOTE — Discharge Summary (Signed)
Discharge Summary  Date of Admission: 11/21/2020  Date of Discharge: 11/24/20  Attending Physician: Emelda Brothers, MD  Hospital Course: Patient was admitted following an uncomplicated left craniotomy for resection of a left temporal brain tumor. She was recovered in PACU and transferred to 4N. A post-op MRI showed good resection with small residual. She clinically did very well, her hospital course was uncomplicated and the patient was discharged home on 11/24/20. She will follow up in clinic with me in 2 weeks.  Neurologic exam at discharge:  AOx3, PERRL, EOMI, FS except baseline L HB2 LMN FP, TM Strength 5/5 x4, SILTx4  Discharge diagnosis: brain tumor  Judith Part, MD 11/24/20 8:05 AM

## 2020-11-24 NOTE — Progress Notes (Signed)
Neurosurgery Service Progress Note  Subjective: No acute events overnight, continued improvement, overall feeling better each day  Objective: Vitals:   11/23/20 1536 11/23/20 1939 11/24/20 0006 11/24/20 0402  BP: 94/60 113/76 (!) 100/59 104/62  Pulse: 66 82 70 64  Resp: '20 19 18 20  '$ Temp: 98.6 F (37 C) 99.6 F (37.6 C) 98.9 F (37.2 C) 99.2 F (37.3 C)  TempSrc: Oral Oral Oral Oral  SpO2: 97% 100% 97% 98%  Weight:      Height:        Physical Exam: AOx3, PERRL, L HB2 facial palsy, intact on R w/ dec'd sensation on R hemiface in [facility] pattern, +L periorbital edema, TM Strength 5/5 x4, SILTx4 Incision c/d/i  Assessment & Plan: 55 y.o. woman s/p left craniotomy for resection of temporal lobe mass, recovering well. Post-op MRI with good resection, small residual, 97% resection by volume, no infarcts.  -discharge home today  Judith Part  11/24/20 8:02 AM

## 2020-11-24 NOTE — Plan of Care (Signed)

## 2020-11-28 ENCOUNTER — Telehealth: Payer: Self-pay | Admitting: Internal Medicine

## 2020-11-28 NOTE — Telephone Encounter (Signed)
Received a new pt referral from Dr. Zada Finders for new dx of GBM. [name] has been cld and scheduled to see Dr. Mickeal Skinner on 7/28 at 10am. Pt aware to arrive 15 minutes early.

## 2020-11-30 ENCOUNTER — Inpatient Hospital Stay: Payer: BC Managed Care – PPO | Attending: Internal Medicine | Admitting: Internal Medicine

## 2020-11-30 VITALS — BP 140/76 | HR 87 | Temp 98.8°F | Resp 18 | Ht 68.0 in | Wt 174.0 lb

## 2020-11-30 DIAGNOSIS — I10 Essential (primary) hypertension: Secondary | ICD-10-CM | POA: Insufficient documentation

## 2020-11-30 DIAGNOSIS — Z8249 Family history of ischemic heart disease and other diseases of the circulatory system: Secondary | ICD-10-CM | POA: Diagnosis not present

## 2020-11-30 DIAGNOSIS — R252 Cramp and spasm: Secondary | ICD-10-CM | POA: Insufficient documentation

## 2020-11-30 DIAGNOSIS — Z800 Family history of malignant neoplasm of digestive organs: Secondary | ICD-10-CM | POA: Diagnosis not present

## 2020-11-30 DIAGNOSIS — Z833 Family history of diabetes mellitus: Secondary | ICD-10-CM | POA: Insufficient documentation

## 2020-11-30 DIAGNOSIS — Z79899 Other long term (current) drug therapy: Secondary | ICD-10-CM | POA: Insufficient documentation

## 2020-11-30 DIAGNOSIS — R569 Unspecified convulsions: Secondary | ICD-10-CM

## 2020-11-30 DIAGNOSIS — C719 Malignant neoplasm of brain, unspecified: Secondary | ICD-10-CM

## 2020-11-30 DIAGNOSIS — Z801 Family history of malignant neoplasm of trachea, bronchus and lung: Secondary | ICD-10-CM | POA: Insufficient documentation

## 2020-11-30 DIAGNOSIS — C712 Malignant neoplasm of temporal lobe: Secondary | ICD-10-CM | POA: Insufficient documentation

## 2020-11-30 MED ORDER — CYCLOBENZAPRINE HCL 5 MG PO TABS
5.0000 mg | ORAL_TABLET | Freq: Three times a day (TID) | ORAL | 1 refills | Status: DC | PRN
Start: 2020-11-30 — End: 2021-01-11

## 2020-11-30 NOTE — Progress Notes (Signed)
 [facility] [place] [address]  [place], [place] [address] [phone]   New Patient Evaluation  Date of Service: 11/30/20 Patient Name: [name] Patient MRN: [id number] Patient DOB: 03/08/1966 Provider: Zachary K Vaslow, MD  Identifying Statement:  [name] is a 55 y.o. female with left temporal glioblastoma who presents for initial consultation and evaluation.    Referring Provider: Nafziger, Cory, NP [address] [place],  [place] [address]  Oncologic History: Oncology History  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)  11/21/2020 Initial Diagnosis   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)    11/21/2020 Cancer Staging   Staging form: Brain and Spinal Cord, AJCC 8th Edition - Pathologic stage from 11/21/2020: WHO Grade IV - Signed by Vaslow, Zachary K, MD on 11/30/2020  Histopathologic type: Glioblastoma  Stage prefix: Initial diagnosis  Histologic grading system: 4 grade system  Extent of surgical resection: Subtotal resection  Karnofsky performance status: Score 90  Seizures at presentation: Present  Duration of symptoms before diagnosis: Short  IDH1 mutation: Negative      Biomarkers:  MGMT Unknown.  IDH 1/2 Unknown.  EGFR Unknown  TERT Unknown   History of Present Illness: The patient's records from the referring physician were obtained and reviewed and the patient interviewed to confirm this HPI.  [name] presented to medical attention this past month with first ever seizure.  She describes episode of sudden onset "disconnection" with tongue biting and urination, followed by period of confusion.  CNS imaging demonstrated enhancing mass within left anterior temporal lobe, which was mostly resected by Dr. Ostergard on 11/21/20.  Since surgery she had not had recurrence of seizures.  She complains of painful cramping of her right leg, which started ~1  week prior.  At this time the cramping is her main complaint.  Otherwise fully functional, independent, would like to return to work if possible.  Medications: Current Outpatient Medications on File Prior to Visit  Medication Sig Dispense Refill   albuterol (VENTOLIN HFA) 108 (90 Base) MCG/ACT inhaler Inhale 2 puffs into the lungs every 6 (six) hours as needed for wheezing or shortness of breath. 8 g 0   atorvastatin (LIPITOR) 20 MG tablet Take 1 tablet (20 mg total) by mouth daily. 90 tablet 1   Cholecalciferol (DIALYVITE VITAMIN D 5000) 125 MCG (5000 UT) capsule Take 1 capsule (5,000 Units total) by mouth daily. 90 capsule 3   hydrochlorothiazide (HYDRODIURIL) 25 MG tablet TAKE 1 TABLET (25 MG TOTAL) BY MOUTH DAILY. 90 tablet 0   HYDROcodone-acetaminophen (NORCO/VICODIN) 5-325 MG tablet Take 1 tablet by mouth every 4 (four) hours as needed for moderate pain. 30 tablet 0   levETIRAcetam (KEPPRA) 1000 MG tablet Take 1 tablet (1,000 mg total) by mouth 2 (two) times daily. 60 tablet 0   levothyroxine (SYNTHROID) 100 MCG tablet Take 1 tablet (100 mcg total) by mouth daily. 90 tablet 3   losartan (COZAAR) 100 MG tablet TAKE 1 TABLET BY MOUTH EVERY DAY 90 tablet 0   potassium chloride (KLOR-CON) 10 MEQ tablet Take 1 tablet (10 mEq total) by mouth daily. 90 tablet 3   SUMAtriptan (IMITREX) 50 MG tablet Take 50 mg by mouth every 2 (two) hours as needed for migraine. May repeat in 2 hours if headache persists or recurs.     No current facility-administered medications on file prior to visit.    Allergies: No Known Allergies Past Medical History:  Past Medical   History:  Diagnosis Date   Bell's palsy    left side of face- notied in smile and eyes, if very tired   Complication of anesthesia    N/V   Essential hypertension    External hemorrhoids    History of blood transfusion    post op Hemorroids   History of pneumonia    Hyperlipidemia    Hyperthyroidism    hx 55 years old- oral meds    Iron deficiency anemia    Migraine headache    Pneumonia    x 2 - years ago   PONV (postoperative nausea and vomiting)    PPD positive    Pulmonary nodules    not seen on plain cxr, first detected 1/07 re ct 10/08 pos IPPD > 15 mm 12/09 FOB/lavage 04/20/08 neg afb smear   PVC's (premature ventricular contractions)    Seizure ([name]) 11/15/2020   Sleep apnea    refused CPAP- mouth piece recommended   Tuberculosis    in her late 30's   Past Surgical History:  Past Surgical History:  Procedure Laterality Date   ABDOMINAL HYSTERECTOMY     APPLICATION OF CRANIAL NAVIGATION N/A 11/21/2020   Procedure: APPLICATION OF CRANIAL NAVIGATION;  Surgeon: Ostergard, Thomas A, MD;  Location: [facility] OR;  Service: Neurosurgery;  Laterality: N/A;   BREAST BIOPSY Right    CHOLECYSTECTOMY  2014   COLONOSCOPY  05/04/2009   prolapsing external hemorrhoids   CRANIOTOMY Left 11/21/2020   Procedure: Left craniotomy for tumor resection;  Surgeon: Ostergard, Thomas A, MD;  Location: [facility] OR;  Service: Neurosurgery;  Laterality: Left;   ENDOMETRIAL ABLATION     HEMORRHOID SURGERY     x 3   TUBAL LIGATION     Social History:  Social History   Socioeconomic History   Marital status: Divorced    Spouse name: Not on file   Number of children: 2   Years of education: Not on file   Highest education level: Not on file  Occupational History   Occupation: accounts billing    Employer: [employer]  Tobacco Use   Smoking status: Never   Smokeless tobacco: Never  Vaping Use   Vaping Use: Never used  Substance and Sexual Activity   Alcohol use: No    Alcohol/week: 0.0 standard drinks   Drug use: No   Sexual activity: Not on file  Other Topics Concern   Not on file  Social History Narrative   Divorced with children   She works in accounting    Possible TB exposure 2008 (after nodules found)   From [facility] [place] and [place]   Never owned birds      Social Determinants of Health   Financial Resource Strain: Not  on file  Food Insecurity: Not on file  Transportation Needs: Not on file  Physical Activity: Not on file  Stress: Not on file  Social Connections: Not on file  Intimate Partner Violence: Not on file   Family History:  Family History  Problem Relation Age of Onset   Heart disease Maternal Grandmother        aunts, uncles, sister   Diabetes Mellitus II Maternal Grandmother    Kidney failure Sister    Diabetes Mellitus II Sister    Lung cancer Other        aunts and uncles   Colon cancer Maternal Aunt        aunts and uncles   Diabetes Mellitus II Maternal Aunt      Colon cancer Maternal Uncle    Diabetes Mellitus II Maternal Uncle    Diabetes type II Mother     Review of Systems: Constitutional: Doesn't report fevers, chills or abnormal weight loss Eyes: Doesn't report blurriness of vision Ears, nose, mouth, throat, and face: Doesn't report sore throat Respiratory: Doesn't report cough, dyspnea or wheezes Cardiovascular: Doesn't report palpitation, chest discomfort  Gastrointestinal:  Doesn't report nausea, constipation, diarrhea GU: Doesn't report incontinence Skin: Doesn't report skin rashes Neurological: Per HPI Musculoskeletal: Doesn't report joint pain Behavioral/Psych: Doesn't report anxiety  Physical Exam: Vitals:   11/30/20 1032  BP: 140/76  Pulse: 87  Resp: 18  Temp: 98.8 F (37.1 C)  SpO2: 97%   KPS: 90. General: Alert, cooperative, pleasant, in no acute distress Head: Normal EENT: No conjunctival injection or scleral icterus.  Lungs: Resp effort normal Cardiac: Regular rate Abdomen: Non-distended abdomen Skin: No rashes cyanosis or petechiae. Extremities: No clubbing or edema  Neurologic Exam: Mental Status: Awake, alert, attentive to examiner. Oriented to self and environment. Language is fluent with intact comprehension.  Cranial Nerves: Visual acuity is grossly normal. Visual fields are full. Extra-ocular movements intact. No ptosis. Face is  symmetric Motor: Tone and bulk are normal. Power is full in both arms and legs. Reflexes are symmetric, no pathologic reflexes present.  Sensory: Intact to light touch Gait: Normal.   Labs: I have reviewed the data as listed    Component Value Date/Time   NA 139 11/18/2020 0325   K 4.2 11/18/2020 0325   CL 108 11/18/2020 0325   CO2 24 11/18/2020 0325   GLUCOSE 147 (H) 11/18/2020 0325   BUN 24 (H) 11/18/2020 0325   CREATININE 1.13 (H) 11/21/2020 1639   CREATININE 0.92 01/25/2020 1424   CALCIUM 9.3 11/18/2020 0325   PROT 6.3 (L) 11/18/2020 0325   ALBUMIN 3.4 (L) 11/18/2020 0325   AST 43 (H) 11/18/2020 0325   ALT 35 11/18/2020 0325   ALKPHOS 69 11/18/2020 0325   BILITOT 1.5 (H) 11/18/2020 0325   GFRNONAA 58 (L) 11/21/2020 1639   GFRNONAA 72 01/25/2020 1424   GFRAA 83 01/25/2020 1424   Lab Results  Component Value Date   WBC 14.5 (H) 11/21/2020   NEUTROABS 11.8 (H) 11/17/2020   HGB 10.5 (L) 11/21/2020   HCT 31.3 (L) 11/21/2020   MCV 89.7 11/21/2020   PLT 232 11/21/2020    Imaging:  DG Chest 2 View  Result Date: 11/09/2020 CLINICAL DATA:  Cough for 6 weeks. EXAM: CHEST - 2 VIEW COMPARISON:  08/10/2019 FINDINGS: The heart size and mediastinal contours are within normal limits. Mild scarring again seen in left lung base. No evidence of pulmonary infiltrate or edema. No evidence of pleural effusion. The visualized skeletal structures are unremarkable. IMPRESSION: Stable exam. No active cardiopulmonary disease. Electronically Signed   By: John A Stahl M.D.   On: 11/09/2020 08:23   CT Head Wo Contrast  Result Date: 11/15/2020 CLINICAL DATA:  Possible seizure or syncope EXAM: CT HEAD WITHOUT CONTRAST TECHNIQUE: Contiguous axial images were obtained from the base of the skull through the vertex without intravenous contrast. COMPARISON:  None. FINDINGS: Brain: There is no acute intracranial hemorrhage. Low attenuation is present in the left temporal lobe extending posterosuperiorly  into the deep white matter tracks there is mild regional mass effect. No substantial midline shift. No hydrocephalus. Gray-white differentiation is otherwise preserved. Vascular: No hyperdense vessel or unexpected calcification. Skull: Calvarium is unremarkable. Sinuses/Orbits: No acute finding. Other: None. IMPRESSION: Edema centered within   the left temporal lobe suspicious for underlying lesion. Contrast enhanced MRI recommended for further evaluation. Electronically Signed   By: Macy Mis M.D.   On: 11/15/2020 15:07   MR BRAIN W WO CONTRAST  Result Date: 11/22/2020 CLINICAL DATA:  Brain mass or lesion status post craniotomy for tumor resection. Left facial droop. EXAM: MRI HEAD WITHOUT AND WITH CONTRAST TECHNIQUE: Multiplanar, multiecho pulse sequences of the brain and surrounding structures were obtained without and with intravenous contrast. CONTRAST:  61m GADAVIST GADOBUTROL 1 MMOL/ML IV SOLN COMPARISON:  CT and MRI 11/15/2020 FINDINGS: Brain: Status post left pterional craniotomy for tumor resection. Subtotal resection of the necrotic mass at the left temporal tip. There is a 1 x 1.2 x 1.5 cm region of residual enhancing tissue along the superior margin of the resection. Second separate area of low level enhancement is seen just posterior to the deep insula. Regional vasogenic edema persists. Overall mass effect is diminished. Right-to-left midline shift 2 mm presently. No postoperative complications such as infarction or extra-axial collection. Vascular: Major vessels at the base of the brain show flow. Skull and upper cervical spine: Otherwise negative Sinuses/Orbits: Clear/normal Other: None IMPRESSION: Subtotal resection of the necrotic enhancing mass of the left temporal tip. 1 x 1.2 x 1.5 cm region of enhancing tumor remaining along the superior margin of the resection. Ill-defined enhancing component redemonstrated just posterior to the deep insula on the left. Electronically Signed   By: MNelson ChimesM.D.   On: 11/22/2020 12:57   MR BRAIN W WO CONTRAST  Result Date: 11/15/2020 CLINICAL DATA:  Abnormal CT EXAM: MRI HEAD WITHOUT AND WITH CONTRAST TECHNIQUE: Multiplanar, multiecho pulse sequences of the brain and surrounding structures were obtained without and with intravenous contrast. CONTRAST:  884mGADAVIST GADOBUTROL 1 MMOL/ML IV SOLN COMPARISON:  None. FINDINGS: Brain: There is a heterogeneously enhancing, likely centrally necrotic lesion of the anterior left temporal lobe measuring approximately 3.5 x 3.1 x 3.4 cm. Associated susceptibility likely reflects intralesional hemorrhage. There is surrounding T2 FLAIR hyperintensity extending posteriorly and superiorly about the insula and to the periatrial white matter. There is some mildly reduced diffusion associated with the superior and posterior portion of this T2 hyperintensity. Additional nonspecific patchy foci of T2 hyperintensity in the supratentorial white matter may reflect gliosis or demyelination. Mass effect related to above is mild. No evidence of acute infarction. No hydrocephalus or extra-axial collection. Vascular: Major vessel flow voids at the skull base are preserved. Skull and upper cervical spine: Normal marrow signal is preserved. Sinuses/Orbits: Minor mucosal thickening.  Orbits are unremarkable. Other: Sella is unremarkable.  Mastoid air cells are clear. IMPRESSION: Necrotic left temporal lobe mass with intralesional hemorrhage. Surrounding edema and/or infiltrating tumor. Mild regional mass effect. Differential considerations are metastatic lesion and high-grade glioma. Electronically Signed   By: PrMacy Mis.D.   On: 11/15/2020 17:11   MR ABDOMEN W WO CONTRAST  Result Date: 11/17/2020 CLINICAL DATA:  [age]ear old female with history of a pancreatic lesion noted on recent CT examination. Follow-up evaluation. EXAM: MRI ABDOMEN WITHOUT AND WITH CONTRAST TECHNIQUE: Multiplanar multisequence MR imaging of the abdomen  was performed both before and after the administration of intravenous contrast. CONTRAST:  7.24m99mADAVIST GADOBUTROL 1 MMOL/ML IV SOLN COMPARISON:  No prior abdominal MRI. CT the chest, abdomen and pelvis 11/15/2020. FINDINGS: Lower chest: Unremarkable. Hepatobiliary: Mild diffuse loss of signal intensity throughout the hepatic parenchyma on out of phase dual echo images, indicative of a background of hepatic steatosis. No suspicious  cystic or solid hepatic lesions. No intra or extrahepatic biliary ductal dilatation. Status post cholecystectomy. Pancreas: In the inferior aspect of the pancreatic head (axial image 84 of series 802 and coronal image 89 of series 9) there is a subtle lesion which is T1 isointense, T2 isointense and appears hypovascular on post gadolinium imaging measuring 1.2 x 1.0 x 1.3 cm. This lesion is immediately to the right of the superior mesenteric vein, with an intervening fat plane, but makes contact with the anterior superior pancreaticoduodenal artery. No other pancreatic mass is noted. No peripancreatic fluid collections or inflammatory changes. Spleen:  Unremarkable. Adrenals/Urinary Tract: Bilateral kidneys and adrenal glands are normal in appearance. No hydroureteronephrosis in the visualized portions of the abdomen. Stomach/Bowel: Visualized portions are unremarkable. Vascular/Lymphatic: Aortic atherosclerosis, without evidence of aneurysm in the abdominal vasculature. Vascular findings pertinent to potential pancreatic neoplasm, as detailed above. No lymphadenopathy noted in the abdomen. Other: No significant volume of ascites noted in the visualized portions of the peritoneal cavity. Musculoskeletal: No aggressive appearing osseous lesions are noted in the visualized portions of the skeleton. IMPRESSION: 1. Very subtle lesion in the inferior aspect of the pancreatic head which has unusual imaging characteristics, but concerning for potential small neoplasm such as a primary  pancreatic adenocarcinoma. Further evaluation with endoscopy and endoscopic ultrasound is strongly recommended in the near future to better characterize this finding. 2. No lymphadenopathy or definite signs of metastatic disease noted in the abdomen at this time. 3. Hepatic steatosis. 4. Aortic atherosclerosis. Electronically Signed   By: Daniel  Entrikin M.D.   On: 11/17/2020 08:13   CT CHEST ABDOMEN PELVIS W CONTRAST  Result Date: 11/15/2020 CLINICAL DATA:  Cancer of unknown primary. EXAM: CT CHEST, ABDOMEN, AND PELVIS WITH CONTRAST TECHNIQUE: Multidetector CT imaging of the chest, abdomen and pelvis was performed following the standard protocol during bolus administration of intravenous contrast. CONTRAST:  100mL OMNIPAQUE IOHEXOL 300 MG/ML  SOLN COMPARISON:  Chest CT November 15, 2020 CT abdomen June 22, 2012 FINDINGS: CT CHEST FINDINGS Cardiovascular: No thoracic aortic aneurysm. Normal caliber central pulmonary arteries. Normal size heart. No significant pericardial effusion/thickening. Mediastinum/Nodes: No pathologically enlarged supraclavicular lymph nodes. Multiple heterogeneous appearing thyroid nodules the largest of which is in the isthmus measuring 16 mm on image 5/3. No pathologically enlarged mediastinal, hilar or axillary lymph nodes. Lungs/Pleura: Stable small right upper lobe pulmonary nodules, measuring up to 4 mm on image 49/4. Stable 4 mm subpleural right lower lobe pulmonary nodule on image 76/4. Stable 4 mm right middle lobe pulmonary nodule image 102/4. No new suspicious pulmonary nodules or masses. Right lower lobe calcified granuloma image 83/4. No pleural effusion or pneumothorax. Mild paraseptal emphysematous change. Stable mild biapical pleuroparenchymal scarring. Bibasilar atelectasis Musculoskeletal: Lobular soft tissue nodularity in the right breast internal calcification measuring 14 mm on image 39/3. Biopsy clip in the left breast and a left axillary lymph node. No aggressive  lytic or blastic lesion of bone. CT ABDOMEN PELVIS FINDINGS Hepatobiliary: Hepatomegaly measuring 20.1 cm in maximum craniocaudal dimension. No suspicious hepatic lesion. Gallbladder surgically absent. No biliary ductal dilation. Pancreas: Hypodense 1.2 x 1.2 cm lesion in the pancreatic head/uncinate process on image 61/3. No pancreatic ductal dilation. Spleen: Within normal limits. Adrenals/Urinary Tract: Adrenal glands are unremarkable. Kidneys are normal, without renal calculi, focal lesion, or hydronephrosis. Bladder is unremarkable. Stomach/Bowel: Is grossly unremarkable for degree of distension. Radiopaque enteric contrast traverses the descending colon. No pathologic dilation of small bowel. Appendix and terminal ileum are grossly unremarkable. No suspicious   colonic wall thickening or mass like lesions visualized. Vascular/Lymphatic: Aortic atherosclerosis without aneurysmal dilation. No pathologically enlarged abdominal or pelvic lymph nodes. Reproductive: Status post hysterectomy. No adnexal masses. Other: No abdominal wall hernia or abnormality. No abdominopelvic ascites. Musculoskeletal: Transitional anatomy with sacralization of L5 on the left. No aggressive lytic or blastic lesion of bone. IMPRESSION: 1. Hypodense 1.2 x 1.2 cm lesion in the pancreatic head/uncinate process, which is nonspecific possibly representing a cystic pancreatic lesion, ceases primary pancreatic neoplasm. Further evaluation with MRI of the abdomen with and without contrast is recommended. 2. Lobular soft tissue nodularity in the right breast measuring 14 mm with an internal focus of calcification. Correlation with mammography is recommended. 3. Multiple heterogeneous appearing thyroid nodules the largest of which is in the isthmus measuring 16 mm. Recommend thyroid US (ref: J Am Coll Radiol. 2015 Feb;12(2): 143-50). 4. Hepatomegaly.  No focal hepatic lesion identified. 5. Small right-sided pulmonary nodules, measuring up to 4 mm,  stable since 2018 and considered benign. 6. Aortic Atherosclerosis (ICD10-I70.0) and Emphysema (ICD10-J43.9). Electronically Signed   By: Jeffrey  Waltz MD   On: 11/15/2020 23:41   DG Chest Portable 1 View  Result Date: 11/15/2020 CLINICAL DATA:  Syncope with incontinence. EXAM: PORTABLE CHEST 1 VIEW COMPARISON:  11/08/2020 FINDINGS: The heart size and mediastinal contours are within normal limits. Decreased lung volumes. No airspace opacities. The visualized skeletal structures are unremarkable. IMPRESSION: Low lung volumes.  No airspace opacities identified. Electronically Signed   By: Taylor  Stroud M.D.   On: 11/15/2020 13:41   EEG adult  Result Date: 11/16/2020 Yadav, Priyanka O, MD     11/16/2020  4:38 PM Patient Name: [name] MRN: [id number] Epilepsy Attending: Priyanka O Yadav Referring Physician/Provider: Dr Ping Zhang Date: 11/16/2020 Duration: 29.07 mins Patient history: 55yo F presented with seizure like activity. EEG to evaluate for seizure. Level of alertness: Awake AEDs during EEG study: LEV Technical aspects: This EEG study was done with scalp electrodes positioned according to the 10-20 International system of electrode placement. Electrical activity was acquired at a sampling rate of 500Hz and reviewed with a high frequency filter of 70Hz and a low frequency filter of 1Hz. EEG data were recorded continuously and digitally stored. Description: The posterior dominant rhythm consists of 9-10 Hz activity of moderate voltage (25-35 uV) seen predominantly in posterior head regions, symmetric and reactive to eye opening and eye closing. EEG showed continuous left temporal 2-3Hz delta slowing as well as intermittent generalized 2-3hz sharply contoured delta slowing. Hyperventilation and photic stimulation were not performed.   ABNORMALITY - Intermittent slow, generalized - Continuous slow, left temporal  region IMPRESSION: This study is suggestive of cortical dysfunction arising from left  temporal region,  likely secondary to underlying structural abnormality/mass, post-ictal state. Additionally there is mild diffuse encephalopathy, nonspecific etiology. No seizures or definite epileptiform discharges were seen throughout the recording. Priyanka O Yadav   US THYROID  Result Date: 11/17/2020 CLINICAL DATA:  Thyroid nodule EXAM: THYROID ULTRASOUND TECHNIQUE: Ultrasound examination of the thyroid gland and adjacent soft tissues was performed. COMPARISON:  None. FINDINGS: Parenchymal Echotexture: Mildly heterogeneous Isthmus: 0.7 cm Right lobe: 4.4 x 1.3 x 1.4 cm Left lobe: 4.4 x 2.0 x 1.2 cm _________________________________________________________ Estimated total number of nodules >/= 1 cm: 1 Number of spongiform nodules >/=  2 cm not described below (TR1): 0 Number of mixed cystic and solid nodules >/= 1.5 cm not described below (TR2): 0 _________________________________________________________ Nodule # 1: Location: Isthmus; superior Maximum size:   1.2 cm; Other 2 dimensions: 1.1 x 0.9 cm Composition: mixed cystic and solid (1) Echogenicity: isoechoic (1) Shape: not taller-than-wide (0) Margins: smooth (0) Echogenic foci: none (0) ACR TI-RADS total points: 2. ACR TI-RADS risk category: TR2 (2 points). ACR TI-RADS recommendations: This nodule does NOT meet TI-RADS criteria for biopsy or dedicated follow-up. _________________________________________________________ Remaining subcentimeter thyroid nodules do not meet criteria for FNA or surveillance. IMPRESSION: No suspicious thyroid nodules. The above is in keeping with the ACR TI-RADS recommendations - J Am Coll Radiol 2017;14:587-595. Electronically Signed   By: Farhaan  Mir M.D.   On: 11/17/2020 16:07   CT CHEST ABDOMEN PELVIS WO CONTRAST  Result Date: 11/15/2020 CLINICAL DATA:  Cancer of unknown primary. EXAM: CT CHEST, ABDOMEN AND PELVIS WITHOUT CONTRAST TECHNIQUE: Multidetector CT imaging of the chest, abdomen and pelvis was performed  following the standard protocol without IV contrast. COMPARISON:  November 23, 2019 and June 22, 2012 FINDINGS: CT CHEST FINDINGS Cardiovascular: No thoracic aortic aneurysm. Normal caliber central pulmonary arteries. Normal size heart. No significant pericardial effusion/thickening. Mediastinum/Nodes: No pathologically enlarged supraclavicular nodes. Apparent nodular enlargement the thyroid without discrete nodularity. No pathologically enlarged mediastinal, hilar or axillary lymph nodes, noting limited sensitivity for the detection of hilar adenopathy on this noncontrast study. The trachea esophagus are grossly unremarkable. Lungs/Pleura: Stable small right upper lobe pulmonary nodules, measuring up to 4 mm on image 48/4. Stable 4 mm subpleural right lower lobe pulmonary nodule on image 75/4. Stable 4 mm right middle lobe pulmonary nodule image 104/4. No new suspicious pulmonary nodules or masses. Right lower lobe calcified granuloma image 78/4. No pleural effusion or pneumothorax. Mild paraseptal emphysematous change. Stable mild biapical pleuroparenchymal scarring. Bibasilar atelectasis. Musculoskeletal: No chest wall mass or suspicious bone lesions identified. CT ABDOMEN PELVIS FINDINGS Hepatobiliary: Hepatomegaly measuring 20.2 cm in maximum craniocaudal dimension. No discrete hepatic lesion visualized. Gallbladder is surgically absent. No biliary ductal dilation. Pancreas: Hypodense 1.6 x 1.5 cm area in the pancreatic head/uncinate process on image 60/3. No pancreatic ductal dilation. Spleen: Within normal limits. Adrenals/Urinary Tract: Adrenal glands are unremarkable. Kidneys are normal, without renal calculi, contour deforming lesion, or hydronephrosis. Bladder is unremarkable. Stomach/Bowel: Radiopaque enteric contrast traverses the descending colon. Stomach is grossly unremarkable for degree of distension. Pathologic dilation of small. The appendix and terminal are grossly. Portions of the sigmoid colon  and rectum decompressed limiting evaluation. Vascular/Lymphatic: Aortic atherosclerosis without aneurysmal dilation. No pathologically enlarged abdominal or pelvic lymph nodes. Reproductive: Status post hysterectomy. No adnexal masses. Other: Abdominopelvic ascites. Musculoskeletal: Transitional anatomy with sacralization of the left L5 vertebral body. No aggressive lytic or blastic lesion of bone. IMPRESSION: 1. Hypodense 1.6 x 1.5 cm area in the pancreatic head/uncinate process, incompletely evaluated without intravenous contrast material and possibly represent fatty infiltration, cystic pancreatic lesion, pseudocyst or primary pancreatic neoplasm. No pancreatic ductal dilation. Further evaluation with MRI of the abdomen with and without contrast is suggested. 2. Apparent nodular enlargement of the thyroid, without discrete thyroid nodule visualized. Nonspecific and possibly within normal limits. However, this could be further evaluated dedicated thyroid ultrasound. 3. Hepatomegaly, of indeterminate clinical significance. No discrete focal lesion visualized on this noncontrast examination. 4. Small pulmonary nodules stable since 2018, favor benign. 5. Aortic Atherosclerosis (ICD10-I70.0) and Emphysema (ICD10-J43.9). Electronically Signed   By: Jeffrey  Waltz MD   On: 11/15/2020 23:19    Pathology: SURGICAL PATHOLOGY  CASE: [id number]  PATIENT: [name]  Surgical Pathology Report   Clinical History: brain tumor (cm)   FINAL MICROSCOPIC DIAGNOSIS:     A. BRAIN TUMOR, LEFT TEMPORAL, RESECTION:  - Glioblastoma, WHO grade IV.   B. BRAIN TUMOR, LEFT TEMPORAL, RESECTION:  - Glioblastoma, WHO grade IV.   COMMENT:   A. and B. Dr. Melina Copa has reviewed the case. Tissue will be sent for  IDH1/IDH2 and MGMT testing.    INTRAOPERATIVE DIAGNOSIS:   A.  Left temporal brain tumor: "Hypercellular brain tissue,  hemorrhage/necrosis"  Intraoperative diagnosis rendered by Dr. Melina Copa at 10:18 AM on   11/21/2020.   GROSS DESCRIPTION:   A: The specimen is received fresh for rapid intraoperative consultation,  and consists of a 1.5 x 1.2 x 0.2 cm aggregate of tan-pink softened  tissue.  The specimen is entirely submitted for frozen section analysis,  and the remnant is resubmitted for permanent in 1 cassette.   B: The specimen is received fresh and consists of a 2.1 x 1.7 x 0.8 cm  aggregate of tan-red softened tissue.  The specimen is entirely  submitted in 2 cassettes.  Craig Staggers 11/21/2020)   Assessment/Plan 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[place]) [C71.9]  We appreciate the opportunity to participate in the care of [name].  She presents today with clinical and radiographic syndrome consistent with left temporal glioblastoma.    We had an extensive conversation with her and her family regarding pathology, prognosis, and available treatment pathways.  We are encouraged by her good functional status and favorable tumor location, good quality resection despite mesial left temporal remnant.   We ultimately recommended proceeding with course of intensity modulated radiation therapy and concurrent daily Temozolomide.  Radiation will be administered Mon-Fri over 6 weeks, Temodar will be dosed at 27m/m2 to be given daily over 42 days.  We reviewed side effects of temodar, including fatigue, nausea/vomiting, constipation, and cytopenias.  Informed consent was verbally obtained at bedside to proceed with oral chemotherapy.  Chemotherapy should be held for the following:  ANC less than 1,000  Platelets less than 100,000  LFT or creatinine greater than 2x ULN  If clinical concerns/contraindications develop  Every 2 weeks during radiation, labs will be checked accompanied by a clinical evaluation in the brain tumor clinic.  For now, should continue Keppra 10040mBID for seizure prevention.  For leg cramps, will prescribe trial of cyclobenzaprine 90m36mID  PRN.  Screening for potential clinical trials was performed and discussed using eligibility criteria for active protocols [facility], as well as national database available on [email or web address]  The patient is a candidate and may be interested in pursuing a research protocol, Testing Ramipril to Prevent Memory Loss in People With Glioblastoma.  We will have one of the research coordinators reach to to assess candidacy.  We spent twenty additional minutes teaching regarding the natural history, biology, and historical experience in the treatment of brain tumors. We then discussed in detail the current recommendations for therapy focusing on the mode of administration, mechanism of action, anticipated toxicities, and quality of life issues associated with this plan. We also provided teaching sheets for the patient to take home as an additional resource.  All questions were answered. The patient knows to call the clinic with any problems, questions or concerns. No barriers to learning were detected.  The total time spent in the encounter was 60 minutes and more than 50% was on counseling and review of test results   ZacVentura SellersD Medical Director of Neuro-Oncology [facility] [place]/28/22 5:03 PM

## 2020-12-01 ENCOUNTER — Other Ambulatory Visit: Payer: Self-pay | Admitting: Radiation Therapy

## 2020-12-01 ENCOUNTER — Other Ambulatory Visit: Payer: Self-pay | Admitting: Adult Health

## 2020-12-01 DIAGNOSIS — C719 Malignant neoplasm of brain, unspecified: Secondary | ICD-10-CM

## 2020-12-04 ENCOUNTER — Encounter (HOSPITAL_COMMUNITY): Payer: Self-pay | Admitting: Neurological Surgery

## 2020-12-11 ENCOUNTER — Ambulatory Visit: Payer: BC Managed Care – PPO | Admitting: Nutrition

## 2020-12-11 ENCOUNTER — Encounter (HOSPITAL_COMMUNITY): Payer: Self-pay | Admitting: Neurological Surgery

## 2020-12-15 NOTE — Progress Notes (Signed)
Location/Histology of Brain Tumor:  Glioblastoma with isocitrate dehydrogenase gene wildtype   Patient presented with symptoms of:  this past month with first ever seizure.  She describes episode of sudden onset "disconnection" with tongue biting and urination, followed by period of confusion.  CNS imaging demonstrated enhancing mass within left anterior temporal lobe  MRI Brain w/ & w/o Contrast 11/22/2020 IMPRESSION: --Subtotal resection of the necrotic enhancing mass of the left temporal tip. 1 x 1.2 x 1.5 cm region of enhancing tumor remaining along the superior margin of the resection. --Ill-defined enhancing component redemonstrated just posterior to the deep insula on the left  MRI Brain w/ & w/o Contrast 11/15/2020 IMPRESSION: --Necrotic left temporal lobe mass with intralesional hemorrhage. --Surrounding edema and/or infiltrating tumor. Mild regional mass effect.  --Differential considerations are metastatic lesion and high-grade glioma.  Past or anticipated interventions, if any, per neurosurgery:  11/21/2020 Dr. Emelda Brothers --Left craniotomy for tumor resection  Past or anticipated interventions, if any, per medical oncology:  Under care of Dr. Cecil Cobbs 11/30/2020 --We ultimately recommended proceeding with course of intensity modulated radiation therapy and concurrent daily Temozolomide.   --Radiation will be administered Mon-Fri over 6 weeks, Temodar will be dosed at '75mg'$ /m2 to be given daily over 42 days.   --We reviewed side effects of temodar, including fatigue, nausea/vomiting, constipation, and cytopenias. --Every 2 weeks during radiation, labs will be checked accompanied by a clinical evaluation in the brain tumor clinic --For now, should continue Keppra '1000mg'$  BID for seizure prevention. --For leg cramps, will prescribe trial of cyclobenzaprine '5mg'$  TID PRN --The patient is a candidate and may be interested in pursuing a research protocol, Testing  Ramipril to Prevent Memory Loss in People With Glioblastoma.  We will have one of the research coordinators reach to to assess candidacy  Dose of Decadron, if applicable: Not prescribed currently   Recent neurologic symptoms, if any:  Seizures: No recurrences since surgery Headaches: Reports occasional headaches on the left side of her head (4 out of 10 pain). If they don't resolve on their own, she will utilize 1 hydrocodone tablet  Nausea: Patient denies Dizziness/ataxia: Patient denies Difficulty with hand coordination: Patient denies Focal numbness/weakness: Patient denies Visual deficits/changes: Patient denies Confusion/Memory deficits: Patient denies   SAFETY ISSUES: Prior radiation? No Pacemaker/ICD? No Possible current pregnancy? No--hysterectomy Is the patient on methotrexate? No  Additional Complaints / other details: Patient has received the first 2 Moderna vaccines

## 2020-12-17 NOTE — Progress Notes (Signed)
Radiation Oncology         [phone] ________________________________  Initial Outpatient Consultation  Name: [name] MRN: [id number]  Date: 12/18/2020  DOB: [birth year]  HG:DJMEQAST, [name], NP  Mickeal Skinner Acey Lav, MD   REFERRING PHYSICIAN: Ventura Sellers, MD  DIAGNOSIS: C71.2   ICD-10-CM   1. 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[place])  C71.9     2. Cancer of temporal lobe ([name])  C71.2       Grade IV (WHO) glioblastoma with isocitrate dehydrogenase gene wildtype  Cancer Staging Glioblastoma with isocitrate dehydrogenase gene wildtype [facility]) Staging form: Brain and Spinal Cord, AJCC 8th Edition - Pathologic stage from 11/21/2020: WHO Grade IV - Signed by Ventura Sellers, MD on 11/30/2020 Histopathologic type: Glioblastoma Stage prefix: Initial diagnosis Histologic grading system: 4 grade system Extent of surgical resection: Subtotal resection Karnofsky performance status: Score 90 Seizures at presentation: Present Duration of symptoms before diagnosis: Short IDH1 mutation: Negative   CHIEF COMPLAINT: Here to discuss management of brain cancer  HISTORY OF PRESENT ILLNESS::[name] is a 55 y.o. female who presented to the [facility] ED on 11/15/20 following seizure episode; preceded by a week long history of intermittent left sided headaches. Patient stated that the headaches would usually last for a few hours associated with nausea. On the morning of her ED encounter, the patient stated that she woke up with severe right-sided eye pain and again developed a left-sided throbbing-like headache along with nausea and vomiting. The patient then immediately called her daughter, who upon arrival, found the patient in bed, incontinent of urine, and confused. The patient reported that she may have passed out but can not clearly recall if she did. Neuro exam performed while in the ED noted to the patient to exhibit left facial droop. The patient was  accordingly admitted.  Imaging taken during hospital course is as follows:  --Head CT on 07/13 demonstrating edema centered within the left temporal lobe suspicious for underlying lesion.  --MRI of the brain on 07/13 further revealed intralesional hemorrhage of a necrotic left temporal mass. Surrounding edema and/or infiltrating tumor were additionally noted. Differential considerations were metastatic lesion, and high-grade glioma. --CT of the chest abdomen and pelvis taken on 11/15/20 demonstrating a hypodense 1.6 x 1.5 cm area in the pancreatic head/uncinate process, (incompletely evaluated); noted to possibly represent fatty infiltration, cystic pancreatic lesion, pseudocyst or primary pancreatic neoplasm. CT also demonstrated an apparent nodular enlargement of the thyroid; however, thyroid US performed on 07/14 demonstrated no suspicious thyroid nodules --MRI of the abdomen on 11/16/20 revealing a subtle lesion in the inferior aspect of the pancreatic head with unusual imaging characteristics; noted to be concerning for potential small neoplasm such as a primary pancreatic adenocarcinoma. Otherwise, no lymphadenopathy or definite signs of metastatic disease were noted in the abdomen.  The patient proceeded to undergo left craniotomy for left temporal tumor resectioning on 11/21/20 under the care of Dr. Zada Finders. Pathology from the procedure revealed glioblastoma; WHO grade IV., isocitrate dehydrogenase gene wildtype  Brain MRI on 11/22/20 post-procedure demonstrated subtotal resectioning of the necrotic enhancing mass of the left temporal tip; with a 1 x 1.2 x 1.5 cm region of enhancing tumor remaining along the superior margin of the resection. Findings additionally noted an ill-defined enhancing component re-demonstrated just prior to the left deep insula.  The patient met for consultation with Dr. Mickeal Skinner on 11/30/20.  The patient was eager to return to work as soon as possible. Dr. Mickeal Skinner  recommended proceeding with course of intensity modulated radiation therapy and concurrent daily Temozolomide. The patient was instructed to continue Keppra 1039m BID for seizure prevention   PREVIOUS RADIATION THERAPY: No  PAST MEDICAL HISTORY:  has a past medical history of Bell's palsy, Complication of anesthesia, Essential hypertension, External hemorrhoids, History of blood transfusion, History of pneumonia, Hyperlipidemia, Hyperthyroidism, Iron deficiency anemia, Migraine headache, Pneumonia, PONV (postoperative nausea and vomiting), PPD positive, Pulmonary nodules, PVC's (premature ventricular contractions), Seizure ([place] (11/15/[place]), Sleep apnea, and Tuberculosis.    PAST SURGICAL HISTORY: Past Surgical History:  Procedure Laterality Date   ABDOMINAL HYSTERECTOMY     APPLICATION OF CRANIAL NAVIGATION N/A 11/21/2020   Procedure: APPLICATION OF CRANIAL NAVIGATION;  Surgeon: OJudith Part MD;  Location: [facility]  Service: Neurosurgery;  Laterality: N/A;   BREAST BIOPSY Right    CHOLECYSTECTOMY  2014   COLONOSCOPY  05/04/2009   prolapsing external hemorrhoids   CRANIOTOMY Left 11/21/2020   Procedure: Left craniotomy for tumor resection;  Surgeon: OJudith Part MD;  [facility]  Service: Neurosurgery;  Laterality: Left;   ENDOMETRIAL ABLATION     HEMORRHOID SURGERY     x 3   TUBAL LIGATION      FAMILY HISTORY: family history includes Colon cancer in her maternal aunt and maternal uncle; Diabetes Mellitus II in her maternal aunt, maternal grandmother, maternal uncle, and sister; Diabetes type II in her mother; Heart disease in her maternal grandmother; Kidney failure in her sister; Lung cancer in an other family member.  SOCIAL HISTORY:  reports that she has never smoked. She has never used smokeless tobacco. She reports that she does not drink alcohol and does not use drugs.  ALLERGIES: Patient has no known allergies.  MEDICATIONS:  Current Outpatient Medications   Medication Sig Dispense Refill   albuterol (VENTOLIN HFA) 108 (90 Base) MCG/ACT inhaler TAKE 2 PUFFS BY MOUTH EVERY 6 HOURS AS NEEDED FOR WHEEZE OR SHORTNESS OF BREATH 8.5 each 0   atorvastatin (LIPITOR) 20 MG tablet Take 1 tablet (20 mg total) by mouth daily. 90 tablet 1   Cholecalciferol (DIALYVITE VITAMIN D 5000) 125 MCG (5000 UT) capsule Take 1 capsule (5,000 Units total) by mouth daily. 90 capsule 3   cyclobenzaprine (FLEXERIL) 5 MG tablet Take 1 tablet (5 mg total) by mouth 3 (three) times daily as needed for muscle spasms. 60 tablet 1   hydrochlorothiazide (HYDRODIURIL) 25 MG tablet TAKE 1 TABLET (25 MG TOTAL) BY MOUTH DAILY. 90 tablet 0   HYDROcodone-acetaminophen (NORCO/VICODIN) 5-325 MG tablet Take 1 tablet by mouth every 4 (four) hours as needed for moderate pain. 30 tablet 0   levETIRAcetam (KEPPRA) 1000 MG tablet Take 1 tablet (1,000 mg total) by mouth 2 (two) times daily. 60 tablet 0   levothyroxine (SYNTHROID) 100 MCG tablet Take 1 tablet (100 mcg total) by mouth daily. 90 tablet 3   losartan (COZAAR) 100 MG tablet TAKE 1 TABLET BY MOUTH EVERY DAY 90 tablet 0   ondansetron (ZOFRAN) 8 MG tablet Take 1 tablet by mouth 2 times daily as needed (nausea and vomiting). May take 30 - 60 minutes prior to Temodar administration if nausea/vomiting occurs. 30 tablet 1   potassium chloride (KLOR-CON) 10 MEQ tablet Take 1 tablet (10 mEq total) by mouth daily. 90 tablet 3   SUMAtriptan (IMITREX) 50 MG tablet Take 50 mg by mouth every 2 (two) hours as needed for migraine. May repeat in 2 hours if headache persists or recurs.  temozolomide (TEMODAR) 140 MG capsule Take 1 capsule (140 mg total) by mouth daily. May take on an empty stomach to decrease nausea & vomiting. 42 capsule 0   No current facility-administered medications for this encounter.    REVIEW OF SYSTEMS:  Notable for that above.   PHYSICAL EXAM:  weight is 180 lb 4 oz (81.8 kg). Her oral temperature is 98.4 F (36.9 C). Her  blood pressure is 125/87 and her pulse is 105 (abnormal). Her respiration is 19 and oxygen saturation is 94%.   General: Alert and oriented, in no acute distress  HEENT: Left Bell's palsy (patient states this is a chronic condition for years). Extraocular movements are intact. Oropharynx is clear without thrush.  Craniotomy scar healing well, left frontal scalp Heart: Regular in rate and rhythm with no murmurs, rubs, or gallops.  No tachycardia at time of auscultation Chest: Clear to auscultation bilaterally, with no rhonchi, wheezes, or rales. Abdomen: Soft, nontender, nondistended, with no rigidity or guarding. Extremities: No cyanosis or edema. Musculoskeletal: symmetric strength and muscle tone throughout. Neurologic: Left Bell's palsy. Speech is fluent. Coordination is intact. Psychiatric: Judgment and insight are intact. Affect is appropriate.  KPS = 90  100 - Normal; no complaints; no evidence of disease. 90   - Able to carry on normal activity; minor signs or symptoms of disease. 80   - Normal activity with effort; some signs or symptoms of disease. 5   - Cares for self; unable to carry on normal activity or to do active work. 60   - Requires occasional assistance, but is able to care for most of his personal needs. 50   - Requires considerable assistance and frequent medical care. 5   - Disabled; requires special care and assistance. 35   - Severely disabled; hospital admission is indicated although death not imminent. 31   - Very sick; hospital admission necessary; active supportive treatment necessary. 10   - Moribund; fatal processes progressing rapidly. 0     - Dead  Karnofsky DA, Abelmann [place], Craver LS and Burchenal JH [phone]) The use of the nitrogen mustards in the palliative treatment of carcinoma: with particular reference to bronchogenic carcinoma Cancer 1 634-56    LABORATORY DATA:  Lab Results  Component Value Date   WBC 14.5 (H) 11/21/2020   HGB 10.5 (L)  11/21/2020   HCT 31.3 (L) 11/21/2020   MCV 89.7 11/21/2020   PLT 232 11/21/2020   CMP     Component Value Date/Time   NA 139 11/18/2020 0325   K 4.2 11/18/2020 0325   CL 108 11/18/2020 0325   CO2 24 11/18/2020 0325   GLUCOSE 147 (H) 11/18/2020 0325   BUN 24 (H) 11/18/2020 0325   CREATININE 1.13 (H) 11/21/2020 1639   CREATININE 0.92 01/25/2020 1424   CALCIUM 9.3 11/18/2020 0325   PROT 6.3 (L) 11/18/2020 0325   ALBUMIN 3.4 (L) 11/18/2020 0325   AST 43 (H) 11/18/2020 0325   ALT 35 11/18/2020 0325   ALKPHOS 69 11/18/2020 0325   BILITOT 1.5 (H) 11/18/2020 0325   GFRNONAA 58 (L) 11/21/2020 1639   GFRNONAA 72 01/25/2020 1424   GFRAA 83 01/25/2020 1424         RADIOGRAPHY: MR BRAIN W WO CONTRAST  Result Date: 11/22/2020 CLINICAL DATA:  Brain mass or lesion status post craniotomy for tumor resection. Left facial droop. EXAM: MRI HEAD WITHOUT AND WITH CONTRAST TECHNIQUE: Multiplanar, multiecho pulse sequences of the brain and surrounding structures were obtained without and  with intravenous contrast. CONTRAST:  31m GADAVIST GADOBUTROL 1 MMOL/ML IV SOLN COMPARISON:  CT and MRI 11/15/2020 FINDINGS: Brain: Status post left pterional craniotomy for tumor resection. Subtotal resection of the necrotic mass at the left temporal tip. There is a 1 x 1.2 x 1.5 cm region of residual enhancing tissue along the superior margin of the resection. Second separate area of low level enhancement is seen just posterior to the deep insula. Regional vasogenic edema persists. Overall mass effect is diminished. Right-to-left midline shift 2 mm presently. No postoperative complications such as infarction or extra-axial collection. Vascular: Major vessels at the base of the brain show flow. Skull and upper cervical spine: Otherwise negative Sinuses/Orbits: Clear/normal Other: None IMPRESSION: Subtotal resection of the necrotic enhancing mass of the left temporal tip. 1 x 1.2 x 1.5 cm region of enhancing tumor remaining  along the superior margin of the resection. Ill-defined enhancing component redemonstrated just posterior to the deep insula on the left. Electronically Signed   By: MNelson ChimesM.D.   On: 11/22/2020 12:57      IMPRESSION/PLAN: Left temporal glioblastoma, subtotally resected, IDH wild-type  Today, I talked to the patient about the findings and work-up thus far.  We discussed the patient's diagnosis of glioblastoma and general treatment for this, highlighting the role of radiotherapy in the management.  We discussed the available radiation techniques, and focused on the details of logistics and delivery.  She understands that she has a serious disease with a guarded prognosis that often progresses despite aggressive treatment.  That said, our goal is to give her the best possible outcome, exceed median survival for patients with glioblastomas, and achieve durable disease control while maintaining her quality of life to the best of our ability.   We discussed the risks, benefits, and side effects of a 6-week course of radiotherapy. Side effects may include but not necessarily be limited to: Partial alopecia of the scalp, fatigue, skin irritation, headache, nausea, potential injury to the intracranial structures and tissues of the brain, cognitive/neurologic decline.  We discussed why the potential benefits of radiation outweigh the risks given the context of her disease. No guarantees of treatment were given. A consent form was signed and placed in the patient's medical record. The patient was encouraged to ask questions that I answered to the best of my ability.  She is enthusiastic to proceed.  We will schedule her for CT simulation in the next couple of days and plan to start treatment next week.  Anticipate concurrent chemotherapy with temozolomide.   On date of service, in total, I spent 60 minutes on this encounter. Patient was seen in person.   __________________________________________   SEppie Gibson MD  This document serves as a record of services personally performed by SEppie Gibson MD. It was created on her behalf by ERoney Mans a trained medical scribe. The creation of this record is based on the scribe's personal observations and the provider's statements to them. This document has been checked and approved by the attending provider.

## 2020-12-18 ENCOUNTER — Other Ambulatory Visit: Payer: Self-pay

## 2020-12-18 ENCOUNTER — Ambulatory Visit
Admission: RE | Admit: 2020-12-18 | Discharge: 2020-12-18 | Disposition: A | Discharge status: Home / Self Care | Payer: BC Managed Care – PPO | Source: Ambulatory Visit | Attending: Radiation Oncology | Admitting: Radiation Oncology

## 2020-12-18 ENCOUNTER — Encounter: Payer: Self-pay | Admitting: Radiation Oncology

## 2020-12-18 VITALS — BP 125/87 | HR 105 | Temp 98.4°F | Resp 19 | Wt 180.2 lb

## 2020-12-18 DIAGNOSIS — G4730 Sleep apnea, unspecified: Secondary | ICD-10-CM | POA: Insufficient documentation

## 2020-12-18 DIAGNOSIS — Z8611 Personal history of tuberculosis: Secondary | ICD-10-CM | POA: Diagnosis not present

## 2020-12-18 DIAGNOSIS — E785 Hyperlipidemia, unspecified: Secondary | ICD-10-CM | POA: Insufficient documentation

## 2020-12-18 DIAGNOSIS — G40909 Epilepsy, unspecified, not intractable, without status epilepticus: Secondary | ICD-10-CM | POA: Diagnosis not present

## 2020-12-18 DIAGNOSIS — R918 Other nonspecific abnormal finding of lung field: Secondary | ICD-10-CM | POA: Insufficient documentation

## 2020-12-18 DIAGNOSIS — E0590 Thyrotoxicosis, unspecified without thyrotoxic crisis or storm: Secondary | ICD-10-CM | POA: Diagnosis not present

## 2020-12-18 DIAGNOSIS — I10 Essential (primary) hypertension: Secondary | ICD-10-CM | POA: Insufficient documentation

## 2020-12-18 DIAGNOSIS — C712 Malignant neoplasm of temporal lobe: Secondary | ICD-10-CM | POA: Insufficient documentation

## 2020-12-18 DIAGNOSIS — Z79899 Other long term (current) drug therapy: Secondary | ICD-10-CM | POA: Insufficient documentation

## 2020-12-18 DIAGNOSIS — D509 Iron deficiency anemia, unspecified: Secondary | ICD-10-CM | POA: Insufficient documentation

## 2020-12-18 DIAGNOSIS — C719 Malignant neoplasm of brain, unspecified: Secondary | ICD-10-CM

## 2020-12-19 ENCOUNTER — Telehealth: Payer: Self-pay

## 2020-12-19 ENCOUNTER — Other Ambulatory Visit (HOSPITAL_COMMUNITY): Payer: Self-pay

## 2020-12-19 ENCOUNTER — Encounter: Payer: Self-pay | Admitting: Internal Medicine

## 2020-12-19 ENCOUNTER — Telehealth: Payer: Self-pay | Admitting: Pharmacist

## 2020-12-19 ENCOUNTER — Other Ambulatory Visit: Payer: Self-pay | Admitting: Internal Medicine

## 2020-12-19 DIAGNOSIS — C719 Malignant neoplasm of brain, unspecified: Secondary | ICD-10-CM

## 2020-12-19 MED ORDER — ONDANSETRON HCL 8 MG PO TABS
8.0000 mg | ORAL_TABLET | Freq: Two times a day (BID) | ORAL | 1 refills | Status: DC | PRN
  Filled 2020-12-19: qty 18, 21d supply, fill #0

## 2020-12-19 MED ORDER — TEMOZOLOMIDE 140 MG PO CAPS
140.0000 mg | ORAL_CAPSULE | Freq: Every day | ORAL | 0 refills | Status: DC
  Filled 2020-12-19: qty 45, 45d supply, fill #0

## 2020-12-19 NOTE — Telephone Encounter (Signed)
Oral Oncology Patient Advocate Encounter   Received notification from [place] that prior authorization for Temozolomide is required.   PA submitted on CoverMyMeds Key [id number] Status is pending   Oral Oncology Clinic will continue to follow.  [place] Patient [place] Phone [phone] Fax [phone] 12/19/2020 4:13 PM

## 2020-12-19 NOTE — Telephone Encounter (Addendum)
Oral Oncology Pharmacist Encounter  Received new prescription for Temodar (temozolomide) for the treatment of glioblastoma in conjunction with radiation, planned duration 42 days.  Prescription dose and frequency assessed for appropriateness. Appropriate for therapy initiation.   CMP from 11/18/20 and CBC from 11/21/20 assessed, no relevant lab abnormalities noted requiring dose adjustment.  Current medication list in Epic reviewed, no relevant/significant DDIs with Temodar identified.  Evaluated chart and no patient barriers to medication adherence noted.   Patient agreement for treatment documented in MD note on 11/30/20.  Patient's insurance requires that Temodar be filled through [facility]. Prescription redirected for dispensing.   Oral Oncology Clinic will continue to follow for insurance authorization, copayment issues, initial counseling and start date.  Leron Croak, PharmD, BCPS Hematology/Oncology Clinical Pharmacist [facility] [phone] 12/19/2020 4:00 PM

## 2020-12-19 NOTE — Progress Notes (Signed)
START ON PATHWAY REGIMEN - Neuro     One cycle, concurrent with RT:     Temozolomide   **Always confirm dose/schedule in your pharmacy ordering system**  Patient Characteristics: Glioblastoma (Grade 4 Glioma), Newly Diagnosed / Treatment Naive, Good Performance Status and/or Younger Patient, MGMT Promoter Unmethylated/Unknown Disease Classification: Glioma Disease Classification: Glioblastoma (Grade 4 Glioma) Disease Status: Newly Diagnosed / Treatment Naive Performance Status: Good Performance Status and/or Younger Patient MGMT Promoter Methylation Status: Awaiting Test Results Intent of Therapy: Non-Curative / Palliative Intent, Discussed with Patient

## 2020-12-20 ENCOUNTER — Ambulatory Visit
Admission: RE | Admit: 2020-12-20 | Discharge: 2020-12-20 | Disposition: A | Discharge status: Home / Self Care | Payer: BC Managed Care – PPO | Source: Ambulatory Visit | Attending: Radiation Oncology | Admitting: Radiation Oncology

## 2020-12-20 ENCOUNTER — Other Ambulatory Visit: Payer: Self-pay

## 2020-12-20 ENCOUNTER — Other Ambulatory Visit (HOSPITAL_COMMUNITY): Payer: Self-pay

## 2020-12-20 ENCOUNTER — Telehealth: Payer: Self-pay

## 2020-12-20 ENCOUNTER — Encounter: Payer: Self-pay | Admitting: Radiation Oncology

## 2020-12-20 DIAGNOSIS — Z510 Encounter for antineoplastic radiation therapy: Secondary | ICD-10-CM | POA: Diagnosis not present

## 2020-12-20 DIAGNOSIS — C712 Malignant neoplasm of temporal lobe: Secondary | ICD-10-CM | POA: Insufficient documentation

## 2020-12-20 MED ORDER — TEMOZOLOMIDE 140 MG PO CAPS: 140.0000 mg | ORAL_CAPSULE | Freq: Every day | ORAL | 0 refills | Status: DC

## 2020-12-20 NOTE — Telephone Encounter (Signed)
Returned call to pt per her request. Pt has questions regarding new oral chemo medication that she will be starting. Informed pt that Pharmacy would be calling her in the am to educate her on her new medication. Pt acknowledged and verified understanding.

## 2020-12-20 NOTE — Telephone Encounter (Signed)
Oral Oncology Patient Advocate Encounter  Prior Authorization for Temozolomide has been approved.    PA# [id number] Effective dates: 12/19/20 through 12/19/21  Patient must fill at [facility] will continue to follow.   [place] [facility] [place] Phone [phone] Fax [phone] 12/20/2020 8:28 AM

## 2020-12-21 NOTE — Telephone Encounter (Signed)
Oral Chemotherapy Pharmacist Encounter   Attempted to reach patient to provide update and offer for initial counseling on oral medication: Temodar (temozolomide).   No answer. Left voicemail for patient to call back to discuss details of medication acquisition and initial counseling session.  Confirmed with [facility] that temozolomide will be delivered to patient's home on 12/22/20.  Leron Croak, PharmD, BCPS Hematology/Oncology Clinical Pharmacist [facility] [phone] 12/21/2020 9:44 AM

## 2020-12-22 NOTE — Telephone Encounter (Signed)
Oral Chemotherapy Pharmacist Encounter  I spoke with patient for overview of: Temodar for the treatment of glioblastoma multiforme in conjunction with radiation, planned duration concomitant phase 42 days of therapy.  Patient will likely continue on Temodar for maintenance treatment for 6-12 cycles after completion of concomitant phase.  Counseled patient on administration, dosing, side effects, monitoring, drug-food interactions, safe handling, storage, and disposal.  Patient will take Temodar '140mg'$  capsules, 1 capsule (140 mg total daily dose), by mouth once daily, may take at bedtime and on an empty stomach to decrease nausea and vomiting.  Patient will take Temodar concurrent with radiation for 42 days straight.  Temodar start date: 12/26/20 PM Radiation start date: 12/27/20   Patient will take Zofran '8mg'$  tablet, 1 tablet by mouth 30-60 min prior to Temodar dose to help decrease N/V once starting adjuvant therapy. Prophylactic Zofran will not be used at initiation of concurrent phase, but will be initiated if nausea develops despite Temodar administration on an empty stomach and at bedtime.   Adverse effects include but are not limited to: nausea, vomiting, anorexia, GI upset, rash, drug fever, and fatigue. Rare but serious adverse effects of pneumocystis pneumonia and secondary malignancy also discussed.  PCP prophylaxis will not be initiated at this time, but may be added based on lymphocyte count in the future.  Reviewed with patient importance of keeping a medication schedule and plan for any missed doses. No barriers to medication adherence identified.  Medication reconciliation performed and medication/allergy list updated.  Insurance authorization for Temodar has been obtained. Medication is being filled through [facility]. Patient will pick up [facility] local [facility] on [facility].  All questions answered.  [name] voiced understanding and appreciation.    Medication education handout placed in mail for patient. Patient knows to call the office with questions or concerns. Oral Chemotherapy Clinic phone number provided to patient.   Leron Croak, PharmD, BCPS Hematology/Oncology Clinical Pharmacist [facility] [phone] 12/22/2020 12:40 PM

## 2020-12-24 ENCOUNTER — Encounter (HOSPITAL_COMMUNITY): Payer: Self-pay | Admitting: Emergency Medicine

## 2020-12-24 ENCOUNTER — Emergency Department (HOSPITAL_COMMUNITY): Payer: BC Managed Care – PPO

## 2020-12-24 ENCOUNTER — Emergency Department (HOSPITAL_COMMUNITY)
Admission: EM | Admit: 2020-12-24 | Discharge: 2020-12-24 | Disposition: A | Discharge status: Home / Self Care | Payer: BC Managed Care – PPO | Attending: Emergency Medicine | Admitting: Emergency Medicine

## 2020-12-24 DIAGNOSIS — Z79899 Other long term (current) drug therapy: Secondary | ICD-10-CM | POA: Insufficient documentation

## 2020-12-24 DIAGNOSIS — I10 Essential (primary) hypertension: Secondary | ICD-10-CM | POA: Diagnosis not present

## 2020-12-24 DIAGNOSIS — E039 Hypothyroidism, unspecified: Secondary | ICD-10-CM | POA: Diagnosis not present

## 2020-12-24 DIAGNOSIS — Z85841 Personal history of malignant neoplasm of brain: Secondary | ICD-10-CM | POA: Insufficient documentation

## 2020-12-24 DIAGNOSIS — R519 Headache, unspecified: Secondary | ICD-10-CM | POA: Diagnosis not present

## 2020-12-24 DIAGNOSIS — R0600 Dyspnea, unspecified: Secondary | ICD-10-CM | POA: Diagnosis not present

## 2020-12-24 DIAGNOSIS — R1110 Vomiting, unspecified: Secondary | ICD-10-CM | POA: Diagnosis not present

## 2020-12-24 DIAGNOSIS — E860 Dehydration: Secondary | ICD-10-CM | POA: Diagnosis not present

## 2020-12-24 DIAGNOSIS — J45901 Unspecified asthma with (acute) exacerbation: Secondary | ICD-10-CM | POA: Diagnosis not present

## 2020-12-24 DIAGNOSIS — R000 Tachycardia, unspecified: Secondary | ICD-10-CM | POA: Diagnosis not present

## 2020-12-24 DIAGNOSIS — R569 Unspecified convulsions: Secondary | ICD-10-CM | POA: Diagnosis not present

## 2020-12-24 DIAGNOSIS — E876 Hypokalemia: Secondary | ICD-10-CM | POA: Diagnosis not present

## 2020-12-24 HISTORY — DX: Malignant neoplasm of brain, unspecified: C71.9

## 2020-12-24 LAB — CBC
HCT: 38.7 % (ref 36.0–46.0)
Hemoglobin: 13 g/dL (ref 12.0–15.0)
MCH: 29.5 pg (ref 26.0–34.0)
MCHC: 33.6 g/dL (ref 30.0–36.0)
MCV: 88 fL (ref 80.0–100.0)
Platelets: 268 10*3/uL (ref 150–400)
RBC: 4.4 MIL/uL (ref 3.87–5.11)
RDW: 13.2 % (ref 11.5–15.5)
WBC: 13.5 10*3/uL — ABNORMAL HIGH (ref 4.0–10.5)
nRBC: 0 % (ref 0.0–0.2)

## 2020-12-24 LAB — URINALYSIS, ROUTINE W REFLEX MICROSCOPIC
Bacteria, UA: NONE SEEN
Bilirubin Urine: NEGATIVE
Glucose, UA: NEGATIVE mg/dL
Ketones, ur: NEGATIVE mg/dL
Leukocytes,Ua: NEGATIVE
Nitrite: NEGATIVE
Protein, ur: NEGATIVE mg/dL
Specific Gravity, Urine: 1.013 (ref 1.005–1.030)
pH: 5 (ref 5.0–8.0)

## 2020-12-24 LAB — DIFFERENTIAL
Abs Immature Granulocytes: 0.04 10*3/uL (ref 0.00–0.07)
Basophils Absolute: 0.1 10*3/uL (ref 0.0–0.1)
Basophils Relative: 0 %
Eosinophils Absolute: 0.3 10*3/uL (ref 0.0–0.5)
Eosinophils Relative: 2 %
Immature Granulocytes: 0 %
Lymphocytes Relative: 21 %
Lymphs Abs: 2.9 10*3/uL (ref 0.7–4.0)
Monocytes Absolute: 1.1 10*3/uL — ABNORMAL HIGH (ref 0.1–1.0)
Monocytes Relative: 8 %
Neutro Abs: 9.3 10*3/uL — ABNORMAL HIGH (ref 1.7–7.7)
Neutrophils Relative %: 69 %

## 2020-12-24 LAB — HEPATIC FUNCTION PANEL
ALT: 19 U/L (ref 0–44)
AST: 19 U/L (ref 15–41)
Albumin: 4 g/dL (ref 3.5–5.0)
Alkaline Phosphatase: 94 U/L (ref 38–126)
Bilirubin, Direct: 0.3 mg/dL — ABNORMAL HIGH (ref 0.0–0.2)
Indirect Bilirubin: 1.8 mg/dL — ABNORMAL HIGH (ref 0.3–0.9)
Total Bilirubin: 2.1 mg/dL — ABNORMAL HIGH (ref 0.3–1.2)
Total Protein: 7.3 g/dL (ref 6.5–8.1)

## 2020-12-24 LAB — BASIC METABOLIC PANEL
Anion gap: 12 (ref 5–15)
BUN: 23 mg/dL — ABNORMAL HIGH (ref 6–20)
CO2: 26 mmol/L (ref 22–32)
Calcium: 10 mg/dL (ref 8.9–10.3)
Chloride: 97 mmol/L — ABNORMAL LOW (ref 98–111)
Creatinine, Ser: 1.73 mg/dL — ABNORMAL HIGH (ref 0.44–1.00)
GFR, Estimated: 35 mL/min — ABNORMAL LOW (ref >60)
Glucose, Bld: 156 mg/dL — ABNORMAL HIGH (ref 70–99)
Potassium: 3 mmol/L — ABNORMAL LOW (ref 3.5–5.1)
Sodium: 135 mmol/L (ref 135–145)

## 2020-12-24 LAB — LACTIC ACID, PLASMA: Lactic Acid, Venous: <0.3 mmol/L — ABNORMAL LOW (ref 0.5–1.9)

## 2020-12-24 MED ORDER — ONDANSETRON HCL 4 MG/2ML IJ SOLN
4.0000 mg | Freq: Once | INTRAMUSCULAR | Status: AC
  Administered 2020-12-24: 4 mg via INTRAVENOUS
  Filled 2020-12-24: qty 2

## 2020-12-24 MED ORDER — MORPHINE SULFATE (PF) 4 MG/ML IV SOLN
4.0000 mg | Freq: Once | INTRAVENOUS | Status: AC
  Administered 2020-12-24: 4 mg via INTRAVENOUS
  Filled 2020-12-24: qty 1

## 2020-12-24 MED ORDER — POTASSIUM CHLORIDE CRYS ER 20 MEQ PO TBCR
40.0000 meq | EXTENDED_RELEASE_TABLET | Freq: Once | ORAL | Status: AC
  Administered 2020-12-24: 40 meq via ORAL
  Filled 2020-12-24: qty 2

## 2020-12-24 MED ORDER — SODIUM CHLORIDE 0.9 % IV BOLUS
1000.0000 mL | Freq: Once | INTRAVENOUS | Status: AC
Start: 2020-12-24 — End: 2020-12-24
  Administered 2020-12-24: 1000 mL via INTRAVENOUS

## 2020-12-24 NOTE — ED Provider Notes (Signed)
[facility] EMERGENCY DEPARTMENT Provider Note   CSN: [id number] Arrival date & time: 12/24/20  1709     History Chief Complaint  Patient presents with   Seizures    [name] is a 55 y.o. female.  55 year old female with history of glioblastoma on oral chemo presents today with not feeling well.  Patient states symptoms started on Friday (2 days ago), states that she has had a headache which has not improved with her Norco as well as feeling short of breath and dizzy.  Patient states she had a seizure today around 4:00, described as similar to prior events- lasts about 2 minutes, unable to move/talk.  States that she felt the episode come on and was assisted to the sofa to lay down.  Afterwards, was able to ask for a wastebasket and had an episode of emesis.  Patient has been in contact with her oncology nurse, her oral temp has not been higher than 100.1 however she has been taking Norco for her headaches, has not had any today, temperature is 98.9 currently.  Otherwise, denies changes in bowel or bladder habits, abdominal pain, cough or URI symptoms.  States that she just generally does not feel well.      Past Medical History:  Diagnosis Date   Bell's palsy    left side of face- notied in smile and eyes, if very tired   Brain cancer ([place])    Complication of anesthesia    N/V   Essential hypertension    External hemorrhoids    History of blood transfusion    post op Hemorroids   History of pneumonia    Hyperlipidemia    Hyperthyroidism    hx 55 years old- oral meds   Iron deficiency anemia    Migraine headache    Pneumonia    x 2 - years ago   PONV (postoperative nausea and vomiting)    PPD positive    Pulmonary nodules    not seen on plain cxr, first detected 1/07 re ct 10/08 pos IPPD > 15 mm 12/09 FOB/lavage 04/20/08 neg afb smear   PVC's (premature ventricular contractions)    Seizure ([place]) 11/15/2020   Sleep apnea    refused CPAP- mouth  piece recommended   Tuberculosis    in her late 30's    Patient Active Problem List   Diagnosis Date Noted   Cancer of temporal lobe ([place]) 12/20/2020   S/P craniotomy 11/21/2020   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[place]) 11/21/2020   Focal seizures ([place]) 11/15/2020   OSA (obstructive sleep apnea) 06/13/2017   Hypothyroid 06/26/2015   Brachial neuritis 03/29/2015   Rotator cuff tear 12/21/2014   Abdominal pain, right upper quadrant 03/14/2014   Leg cramps 07/15/2013   Acquired anal stenosis 06/18/2011   Chronic constipation 06/18/2011   PVC's (premature ventricular contractions) 05/23/2011   Headache, migraine, intractable 05/09/2011   Asthma exacerbation, allergic 07/31/2010   POSITIVE PPD 05/04/2008   Hyperlipidemia 02/05/2008   Essential hypertension 02/05/2008   Pulmonary nodules 02/05/2008   Chronic cough 02/05/2008    Past Surgical History:  Procedure Laterality Date   ABDOMINAL HYSTERECTOMY     APPLICATION OF CRANIAL NAVIGATION N/A 11/21/2020   Procedure: APPLICATION OF CRANIAL NAVIGATION;  Surgeon: Judith Part, MD;  Location: [facility];  Service: Neurosurgery;  Laterality: N/A;   BREAST BIOPSY Right    CHOLECYSTECTOMY  2014   COLONOSCOPY  05/04/2009   prolapsing external hemorrhoids   CRANIOTOMY  Left 11/21/2020   Procedure: Left craniotomy for tumor resection;  Surgeon: Judith Part, MD;  Location: [facility];  Service: Neurosurgery;  Laterality: Left;   ENDOMETRIAL ABLATION     HEMORRHOID SURGERY     x 3   TUBAL LIGATION       OB History     Gravida  3   Para  2   Term      Preterm      AB      Living  2      SAB      IAB      Ectopic      Multiple      Live Births              Family History  Problem Relation Age of Onset   Heart disease Maternal Grandmother        aunts, uncles, sister   Diabetes Mellitus II Maternal Grandmother    Kidney failure Sister    Diabetes Mellitus II Sister    Lung cancer  Other        aunts and uncles   Colon cancer Maternal Aunt        aunts and uncles   Diabetes Mellitus II Maternal Aunt    Colon cancer Maternal Uncle    Diabetes Mellitus II Maternal Uncle    Diabetes type II Mother     Social History   Tobacco Use   Smoking status: Never   Smokeless tobacco: Never  Vaping Use   Vaping Use: Never used  Substance Use Topics   Alcohol use: No    Alcohol/week: 0.0 standard drinks   Drug use: No    Home Medications Prior to Admission medications   Medication Sig Start Date End Date Taking? Authorizing Provider  albuterol (VENTOLIN HFA) 108 (90 Base) MCG/ACT inhaler TAKE 2 PUFFS BY MOUTH EVERY 6 HOURS AS NEEDED FOR WHEEZE OR SHORTNESS OF BREATH 12/01/20  Yes Nafziger, [name], NP  atorvastatin (LIPITOR) 20 MG tablet Take 1 tablet (20 mg total) by mouth daily. 11/08/20  Yes Nafziger, [name], NP  Cholecalciferol (DIALYVITE VITAMIN D 5000) 125 MCG (5000 UT) capsule Take 1 capsule (5,000 Units total) by mouth daily. 01/26/20  Yes Nafziger, [name], NP  cyclobenzaprine (FLEXERIL) 5 MG tablet Take 1 tablet (5 mg total) by mouth 3 (three) times daily as needed for muscle spasms. 11/30/20  Yes Vaslow, Acey Lav, MD  hydrochlorothiazide (HYDRODIURIL) 25 MG tablet TAKE 1 TABLET (25 MG TOTAL) BY MOUTH DAILY. 11/09/20  Yes Nafziger, [name], NP  HYDROcodone-acetaminophen (NORCO/VICODIN) 5-325 MG tablet Take 1 tablet by mouth every 4 (four) hours as needed for moderate pain. 11/24/20  Yes Judith Part, MD  levETIRAcetam (KEPPRA) 1000 MG tablet Take 1 tablet (1,000 mg total) by mouth 2 (two) times daily. 11/18/20 12/24/20 Yes Donne Hazel, MD  levothyroxine (SYNTHROID) 100 MCG tablet Take 1 tablet (100 mcg total) by mouth daily. 01/26/20  Yes Nafziger, [name], NP  losartan (COZAAR) 100 MG tablet TAKE 1 TABLET BY MOUTH EVERY DAY 11/09/20  Yes Nafziger, [name], NP  ondansetron (ZOFRAN) 8 MG tablet Take 1 tablet by mouth 2 times daily as needed (nausea and vomiting). May take 30 - 60  minutes prior to Temodar administration if nausea/vomiting occurs. 12/19/20  Yes Vaslow, Acey Lav, MD  potassium chloride (KLOR-CON) 10 MEQ tablet Take 1 tablet (10 mEq total) by mouth daily. 01/26/20  Yes Nafziger, [name], NP  temozolomide (TEMODAR) 140 MG capsule Take 1  capsule (140 mg total) by mouth daily. May take on an empty stomach to decrease nausea & vomiting. 12/20/20  Yes Vaslow, Acey Lav, MD  SUMAtriptan (IMITREX) 50 MG tablet Take 50 mg by mouth every 2 (two) hours as needed for migraine. May repeat in 2 hours if headache persists or recurs. Patient not taking: Reported on 12/24/2020    [provider]    Allergies    Patient has no known allergies.  Review of Systems   Review of Systems  Constitutional:  Negative for fever.  HENT:  Negative for congestion.   Eyes:  Negative for visual disturbance.  Respiratory:  Positive for shortness of breath. Negative for cough.   Cardiovascular:  Negative for chest pain.  Gastrointestinal:  Positive for nausea and vomiting. Negative for abdominal pain, constipation and diarrhea.  Genitourinary:  Negative for dysuria.  Musculoskeletal:  Negative for arthralgias and myalgias.  Skin:  Negative for rash and wound.  Allergic/Immunologic: Positive for immunocompromised state.  Neurological:  Positive for seizures, weakness and headaches. Negative for speech difficulty.  Psychiatric/Behavioral:  Negative for confusion.   All other systems reviewed and are negative.  Physical Exam Updated Vital Signs BP 116/77   Pulse (!) 106   Temp 98.2 F (36.8 C) (Oral)   Resp 11   SpO2 97%   Physical Exam Vitals and nursing note reviewed.  Constitutional:      General: She is not in acute distress.    Appearance: She is well-developed. She is not diaphoretic.  HENT:     Head: Normocephalic and atraumatic.     Mouth/Throat:     Mouth: Mucous membranes are moist.     Pharynx: No oropharyngeal exudate or posterior oropharyngeal erythema.   Eyes:     Extraocular Movements: Extraocular movements intact.     Conjunctiva/sclera: Conjunctivae normal.     Pupils: Pupils are equal, round, and reactive to light.  Cardiovascular:     Rate and Rhythm: Regular rhythm. Tachycardia present.     Heart sounds: Normal heart sounds.  Pulmonary:     Effort: Pulmonary effort is normal.     Breath sounds: Normal breath sounds.  Abdominal:     Palpations: Abdomen is soft.     Tenderness: There is no abdominal tenderness.  Musculoskeletal:     Cervical back: Neck supple.     Right lower leg: No edema.     Left lower leg: No edema.  Skin:    General: Skin is warm and dry.     Findings: No erythema, lesion or rash.  Neurological:     Mental Status: She is alert and oriented to person, place, and time.     Sensory: No sensory deficit.     Motor: No weakness.  Psychiatric:        Behavior: Behavior normal.    ED Results / Procedures / Treatments   Labs (all labs ordered are listed, but only abnormal results are displayed) Labs Reviewed  BASIC METABOLIC PANEL - Abnormal; Notable for the following components:      Result Value   Potassium 3.0 (*)    Chloride 97 (*)    Glucose, Bld 156 (*)    BUN 23 (*)    Creatinine, Ser 1.73 (*)    GFR, Estimated 35 (*)    All other components within normal limits  CBC - Abnormal; Notable for the following components:   WBC 13.5 (*)    All other components within normal limits  LACTIC ACID,  PLASMA - Abnormal; Notable for the following components:   Lactic Acid, Venous <0.3 (*)    All other components within normal limits  URINALYSIS, ROUTINE W REFLEX MICROSCOPIC - Abnormal; Notable for the following components:   APPearance HAZY (*)    Hgb urine dipstick SMALL (*)    All other components within normal limits  HEPATIC FUNCTION PANEL - Abnormal; Notable for the following components:   Total Bilirubin 2.1 (*)    Bilirubin, Direct 0.3 (*)    Indirect Bilirubin 1.8 (*)    All other components  within normal limits  DIFFERENTIAL - Abnormal; Notable for the following components:   Neutro Abs 9.3 (*)    Monocytes Absolute 1.1 (*)    All other components within normal limits  CULTURE, BLOOD (ROUTINE X 2)  CULTURE, BLOOD (ROUTINE X 2)    EKG None  Radiology DG Chest 2 View  Result Date: 12/24/2020 CLINICAL DATA:  Dyspnea EXAM: CHEST - 2 VIEW COMPARISON:  11/15/2020 FINDINGS: The heart size and mediastinal contours are within normal limits. Both lungs are clear. The visualized skeletal structures are unremarkable. IMPRESSION: No active cardiopulmonary disease. Electronically Signed   By: Fidela Salisbury M.D.   On: 12/24/2020 19:11   CT Head Wo Contrast  Result Date: 12/24/2020 CLINICAL DATA:  Brain mass or lesion.  Seizure.  Known brain tumor. EXAM: CT HEAD WITHOUT CONTRAST TECHNIQUE: Contiguous axial images were obtained from the base of the skull through the vertex without intravenous contrast. COMPARISON:  CT head 11/15/2020.  MRI brain 11/22/2020 FINDINGS: Brain: Interval resection of focal lesion in the left temporal lobe with extra-axial collection in the left middle cranial fossa, likely postoperative. Deformity and increased density along the postoperative bed is likely also due to postoperative change. No significant mass effect or midline shift. Gray-white matter junctions are distinct. Basal cisterns are not effaced. Ventricles are not dilated. Vascular: No hyperdense vessel or unexpected calcification. Skull: Postoperative changes with left frontotemporal craniotomy. Plate and screw fixation of the bone flaps. Sinuses/Orbits: Paranasal sinuses and mastoid air cells are clear. Other: None. IMPRESSION: Interval postoperative changes with left frontotemporal craniotomy and resection of a previous left temporal lobe mass. Residual collection and contour deformity in the postoperative region consistent with postoperative changes. Electronically Signed   By: Lucienne Capers M.D.   On:  12/24/2020 19:48    Procedures Procedures   Medications Ordered in ED Medications  sodium chloride 0.9 % bolus 1,000 mL (0 mLs Intravenous Stopped 12/24/20 2027)  morphine 4 MG/ML injection 4 mg (4 mg Intravenous Given 12/24/20 1834)  ondansetron (ZOFRAN) injection 4 mg (4 mg Intravenous Given 12/24/20 1834)  potassium chloride SA (KLOR-CON) CR tablet 40 mEq (40 mEq Oral Given 12/24/20 1950)    ED Course  I have reviewed the triage vital signs and the nursing notes.  Pertinent labs & imaging results that were available during my care of the patient were reviewed by me and considered in my medical decision making (see chart for details).  Clinical Course as of 12/24/20 2142  Nancy Fetter [birth year]  8164 55 year old female with complaint of feeling unwell today as above. On exam, appears to feel unwell although not toxic.  Patient is tachycardic and mildly tachypneic.  Recheck oral temp is normal, refuses rectal temp check. CBC with white blood cell count of 13.5, not significantly changed from prior.  Differential unchanged from prior as well.  BMP with mild hypokalemia with potassium of 3.0, replaced orally.  Creatinine  slightly increased today to 1.73, formally 1.13.  Patient was given IV fluids. Urinalysis is hazy with small hemoglobin, no evidence of UTI. Chest x-ray unremarkable. CT head without significant findings. [LM]  2141 Case discussed with Dr. Francia Greaves, ER attending. Patient is feeling better at this time and would prefer to be discharged home to follow-up with her care team. [LM]    Clinical Course User Index [LM] Roque Lias   MDM Rules/Calculators/A&P                           Final Clinical Impression(s) / ED Diagnoses Final diagnoses:  Dehydration  Hypokalemia    Rx / DC Orders ED Discharge Orders     None        Roque Lias 12/24/20 2142    Valarie Merino, MD 12/27/20 1453

## 2020-12-24 NOTE — ED Triage Notes (Signed)
Pt states she has stage 4 brain cancer and is scheduled to start chemo and radiation this week.  Pt believes she had a seizure today.  Family reported she was shaking, staring, and non-verbal for approx 2 min.  Reports previous seizure just prior to diagnosis of brain cancer.  States she has been having 10/10 headaches and dizziness all weekend.

## 2020-12-24 NOTE — Discharge Instructions (Addendum)
Home to rest and hydrate.  Recommend calling your primary care provider to discuss monitoring your potassium level and adjusting her medication if needed. Follow-up with your oncology team, call tomorrow to discuss today's events. Return to the emergency room for any concerning symptoms.

## 2020-12-24 NOTE — ED Notes (Signed)
I introduced myself to pt/niece. Pt AxO x4 with a GCS of 15. Pt tachypneic so I asked her if she was SOA. She said no, that she breathes like that when she's tired. Pt denies pain. Pt and daughter believe pt has a seizure about 1600 today that lasted about 2 minutes. Pt was "spacey" and not responding. Pt has a hx of seizures and takes keppra for it BID. Pt hasn't missed any doses and took both doses today. Pt had a mass removed from L side of brain 3 weeks ago. Last seizure was the week before that.

## 2020-12-24 NOTE — ED Notes (Signed)
Patient transported to CT 

## 2020-12-24 NOTE — ED Notes (Signed)
Patient transported to X-ray 

## 2020-12-24 NOTE — ED Notes (Signed)
Pt ambulatory at d/c. VSS. GCS 15. Feeling better. Steady on feet. Niece with pt.

## 2020-12-25 ENCOUNTER — Telehealth: Payer: Self-pay | Admitting: Internal Medicine

## 2020-12-25 NOTE — Telephone Encounter (Signed)
Scheduled appts per 8/22 sch msg. Pt aware.  

## 2020-12-26 ENCOUNTER — Telehealth: Payer: Self-pay

## 2020-12-26 DIAGNOSIS — Z510 Encounter for antineoplastic radiation therapy: Secondary | ICD-10-CM | POA: Diagnosis not present

## 2020-12-26 DIAGNOSIS — C712 Malignant neoplasm of temporal lobe: Secondary | ICD-10-CM | POA: Diagnosis not present

## 2020-12-26 NOTE — Telephone Encounter (Signed)
Pt advised that [name] will return call tomorrow after last pt. PT verbalized understanding.

## 2020-12-26 NOTE — Telephone Encounter (Signed)
Patient wants to discuss recently Dx stage 4 brain cancer.  Patient wants to schedule appt around her chemotherapy appts and none of the times I offer worked for her pt would like a call back.

## 2020-12-27 ENCOUNTER — Other Ambulatory Visit: Payer: Self-pay

## 2020-12-27 ENCOUNTER — Other Ambulatory Visit: Payer: Self-pay | Admitting: Adult Health

## 2020-12-27 ENCOUNTER — Ambulatory Visit
Admission: RE | Admit: 2020-12-27 | Discharge: 2020-12-27 | Disposition: A | Discharge status: Home / Self Care | Payer: BC Managed Care – PPO | Source: Ambulatory Visit | Attending: Radiation Oncology | Admitting: Radiation Oncology

## 2020-12-27 DIAGNOSIS — C712 Malignant neoplasm of temporal lobe: Secondary | ICD-10-CM | POA: Diagnosis not present

## 2020-12-27 DIAGNOSIS — Z510 Encounter for antineoplastic radiation therapy: Secondary | ICD-10-CM | POA: Diagnosis not present

## 2020-12-27 MED ORDER — POTASSIUM CHLORIDE ER 20 MEQ PO TBCR: 20.0000 meq | EXTENDED_RELEASE_TABLET | Freq: Every day | ORAL | 1 refills | Status: DC

## 2020-12-28 ENCOUNTER — Telehealth: Payer: Self-pay | Admitting: Adult Health

## 2020-12-28 ENCOUNTER — Ambulatory Visit
Admission: RE | Admit: 2020-12-28 | Discharge: 2020-12-28 | Disposition: A | Discharge status: Home / Self Care | Payer: BC Managed Care – PPO | Source: Ambulatory Visit | Attending: Radiation Oncology | Admitting: Radiation Oncology

## 2020-12-28 DIAGNOSIS — C712 Malignant neoplasm of temporal lobe: Secondary | ICD-10-CM | POA: Diagnosis not present

## 2020-12-28 DIAGNOSIS — Z510 Encounter for antineoplastic radiation therapy: Secondary | ICD-10-CM | POA: Diagnosis not present

## 2020-12-28 NOTE — Telephone Encounter (Signed)
Spoke to the patient who informed me that she is currently going through chemo and radiation for glioblastoma.  She is doing well with her treatments.  She has a positive attitude.  She has noted that her potassium has been low during blood draws, currently on K-Dur 10 mEq.  We will increase this to 20 mEq daily, she has labs pending in roughly 10 days.

## 2020-12-29 ENCOUNTER — Other Ambulatory Visit: Payer: Self-pay

## 2020-12-29 ENCOUNTER — Ambulatory Visit
Admission: RE | Admit: 2020-12-29 | Discharge: 2020-12-29 | Disposition: A | Discharge status: Home / Self Care | Payer: BC Managed Care – PPO | Source: Ambulatory Visit | Attending: Radiation Oncology | Admitting: Radiation Oncology

## 2020-12-29 DIAGNOSIS — C712 Malignant neoplasm of temporal lobe: Secondary | ICD-10-CM | POA: Diagnosis not present

## 2020-12-29 DIAGNOSIS — Z510 Encounter for antineoplastic radiation therapy: Secondary | ICD-10-CM | POA: Diagnosis not present

## 2020-12-29 LAB — CULTURE, BLOOD (ROUTINE X 2)
Culture: NO GROWTH
Culture: NO GROWTH
Special Requests: ADEQUATE
Special Requests: ADEQUATE

## 2021-01-01 ENCOUNTER — Other Ambulatory Visit: Payer: Self-pay

## 2021-01-01 ENCOUNTER — Ambulatory Visit
Admission: RE | Admit: 2021-01-01 | Discharge: 2021-01-01 | Disposition: A | Discharge status: Home / Self Care | Payer: BC Managed Care – PPO | Source: Ambulatory Visit | Attending: Radiation Oncology | Admitting: Radiation Oncology

## 2021-01-01 DIAGNOSIS — Z510 Encounter for antineoplastic radiation therapy: Secondary | ICD-10-CM | POA: Diagnosis not present

## 2021-01-01 DIAGNOSIS — C712 Malignant neoplasm of temporal lobe: Secondary | ICD-10-CM

## 2021-01-01 MED ORDER — SONAFINE EX EMUL
1.0000 "application " | Freq: Two times a day (BID) | CUTANEOUS | Status: DC
  Administered 2021-01-01: 1 via TOPICAL

## 2021-01-01 NOTE — Progress Notes (Signed)
Pt here for patient teaching.    Pt given Radiation and You booklet, skin care instructions, and Sonafine.    Reviewed areas of pertinence such as fatigue, hair loss, nausea and vomiting, skin changes, headache, and blurry vision .   Pt able to give teach back of to pat skin, use unscented/gentle soap, and drink plenty of water,apply Sonafine bid and avoid applying anything to skin within 4 hours of treatment.   Pt demonstrated understanding and verbalizes understanding of information given and will contact nursing with any questions or concerns.    [email or web address]

## 2021-01-02 ENCOUNTER — Ambulatory Visit
Admission: RE | Admit: 2021-01-02 | Discharge: 2021-01-02 | Disposition: A | Discharge status: Home / Self Care | Payer: BC Managed Care – PPO | Source: Ambulatory Visit | Attending: Radiation Oncology | Admitting: Radiation Oncology

## 2021-01-02 ENCOUNTER — Telehealth: Payer: BC Managed Care – PPO | Admitting: Adult Health

## 2021-01-02 DIAGNOSIS — Z510 Encounter for antineoplastic radiation therapy: Secondary | ICD-10-CM | POA: Diagnosis not present

## 2021-01-02 DIAGNOSIS — C712 Malignant neoplasm of temporal lobe: Secondary | ICD-10-CM | POA: Diagnosis not present

## 2021-01-03 ENCOUNTER — Ambulatory Visit
Admission: RE | Admit: 2021-01-03 | Discharge: 2021-01-03 | Disposition: A | Discharge status: Home / Self Care | Payer: BC Managed Care – PPO | Source: Ambulatory Visit | Attending: Radiation Oncology | Admitting: Radiation Oncology

## 2021-01-03 ENCOUNTER — Other Ambulatory Visit: Payer: Self-pay

## 2021-01-03 DIAGNOSIS — C712 Malignant neoplasm of temporal lobe: Secondary | ICD-10-CM | POA: Diagnosis not present

## 2021-01-03 DIAGNOSIS — Z510 Encounter for antineoplastic radiation therapy: Secondary | ICD-10-CM | POA: Diagnosis not present

## 2021-01-04 ENCOUNTER — Ambulatory Visit
Admission: RE | Admit: 2021-01-04 | Discharge: 2021-01-04 | Disposition: A | Discharge status: Home / Self Care | Payer: BC Managed Care – PPO | Source: Ambulatory Visit | Attending: Radiation Oncology | Admitting: Radiation Oncology

## 2021-01-04 DIAGNOSIS — C712 Malignant neoplasm of temporal lobe: Secondary | ICD-10-CM | POA: Insufficient documentation

## 2021-01-04 DIAGNOSIS — Z510 Encounter for antineoplastic radiation therapy: Secondary | ICD-10-CM | POA: Insufficient documentation

## 2021-01-05 ENCOUNTER — Other Ambulatory Visit: Payer: Self-pay

## 2021-01-05 ENCOUNTER — Ambulatory Visit
Admission: RE | Admit: 2021-01-05 | Discharge: 2021-01-05 | Disposition: A | Discharge status: Home / Self Care | Payer: BC Managed Care – PPO | Source: Ambulatory Visit | Attending: Radiation Oncology | Admitting: Radiation Oncology

## 2021-01-05 DIAGNOSIS — Z510 Encounter for antineoplastic radiation therapy: Secondary | ICD-10-CM | POA: Diagnosis not present

## 2021-01-05 DIAGNOSIS — C712 Malignant neoplasm of temporal lobe: Secondary | ICD-10-CM | POA: Diagnosis not present

## 2021-01-09 ENCOUNTER — Other Ambulatory Visit: Payer: Self-pay

## 2021-01-09 ENCOUNTER — Ambulatory Visit
Admission: RE | Admit: 2021-01-09 | Discharge: 2021-01-09 | Disposition: A | Discharge status: Home / Self Care | Payer: BC Managed Care – PPO | Source: Ambulatory Visit | Attending: Radiation Oncology | Admitting: Radiation Oncology

## 2021-01-09 DIAGNOSIS — Z510 Encounter for antineoplastic radiation therapy: Secondary | ICD-10-CM | POA: Diagnosis not present

## 2021-01-09 DIAGNOSIS — C712 Malignant neoplasm of temporal lobe: Secondary | ICD-10-CM | POA: Diagnosis not present

## 2021-01-10 ENCOUNTER — Ambulatory Visit
Admission: RE | Admit: 2021-01-10 | Discharge: 2021-01-10 | Disposition: A | Discharge status: Home / Self Care | Payer: BC Managed Care – PPO | Source: Ambulatory Visit | Attending: Radiation Oncology | Admitting: Radiation Oncology

## 2021-01-10 DIAGNOSIS — Z510 Encounter for antineoplastic radiation therapy: Secondary | ICD-10-CM | POA: Diagnosis not present

## 2021-01-10 DIAGNOSIS — C712 Malignant neoplasm of temporal lobe: Secondary | ICD-10-CM | POA: Diagnosis not present

## 2021-01-11 ENCOUNTER — Ambulatory Visit
Admission: RE | Admit: 2021-01-11 | Discharge: 2021-01-11 | Disposition: A | Discharge status: Home / Self Care | Payer: BC Managed Care – PPO | Source: Ambulatory Visit | Attending: Radiation Oncology | Admitting: Radiation Oncology

## 2021-01-11 ENCOUNTER — Inpatient Hospital Stay: Payer: BC Managed Care – PPO | Attending: Internal Medicine

## 2021-01-11 ENCOUNTER — Other Ambulatory Visit: Payer: Self-pay

## 2021-01-11 ENCOUNTER — Inpatient Hospital Stay (HOSPITAL_BASED_OUTPATIENT_CLINIC_OR_DEPARTMENT_OTHER): Payer: BC Managed Care – PPO | Admitting: Internal Medicine

## 2021-01-11 VITALS — BP 131/81 | HR 92 | Temp 98.3°F | Resp 18 | Wt 176.3 lb

## 2021-01-11 DIAGNOSIS — Z79899 Other long term (current) drug therapy: Secondary | ICD-10-CM | POA: Insufficient documentation

## 2021-01-11 DIAGNOSIS — Z923 Personal history of irradiation: Secondary | ICD-10-CM | POA: Insufficient documentation

## 2021-01-11 DIAGNOSIS — C712 Malignant neoplasm of temporal lobe: Secondary | ICD-10-CM | POA: Diagnosis not present

## 2021-01-11 DIAGNOSIS — C719 Malignant neoplasm of brain, unspecified: Secondary | ICD-10-CM

## 2021-01-11 DIAGNOSIS — Z510 Encounter for antineoplastic radiation therapy: Secondary | ICD-10-CM | POA: Diagnosis not present

## 2021-01-11 LAB — CBC WITH DIFFERENTIAL (CANCER CENTER ONLY)
Abs Immature Granulocytes: 0.01 10*3/uL (ref 0.00–0.07)
Basophils Absolute: 0 10*3/uL (ref 0.0–0.1)
Basophils Relative: 1 %
Eosinophils Absolute: 0.2 10*3/uL (ref 0.0–0.5)
Eosinophils Relative: 4 %
HCT: 38.1 % (ref 36.0–46.0)
Hemoglobin: 12.7 g/dL (ref 12.0–15.0)
Immature Granulocytes: 0 %
Lymphocytes Relative: 23 %
Lymphs Abs: 1.3 10*3/uL (ref 0.7–4.0)
MCH: 29.3 pg (ref 26.0–34.0)
MCHC: 33.3 g/dL (ref 30.0–36.0)
MCV: 87.8 fL (ref 80.0–100.0)
Monocytes Absolute: 0.4 10*3/uL (ref 0.1–1.0)
Monocytes Relative: 7 %
Neutro Abs: 3.6 10*3/uL (ref 1.7–7.7)
Neutrophils Relative %: 65 %
Platelet Count: 296 10*3/uL (ref 150–400)
RBC: 4.34 MIL/uL (ref 3.87–5.11)
RDW: 13.2 % (ref 11.5–15.5)
WBC Count: 5.5 10*3/uL (ref 4.0–10.5)
nRBC: 0 % (ref 0.0–0.2)

## 2021-01-11 LAB — CMP (CANCER CENTER ONLY)
ALT: 23 U/L (ref 0–44)
AST: 15 U/L (ref 15–41)
Albumin: 4.3 g/dL (ref 3.5–5.0)
Alkaline Phosphatase: 101 U/L (ref 38–126)
Anion gap: 13 (ref 5–15)
BUN: 12 mg/dL (ref 6–20)
CO2: 28 mmol/L (ref 22–32)
Calcium: 10.5 mg/dL — ABNORMAL HIGH (ref 8.9–10.3)
Chloride: 103 mmol/L (ref 98–111)
Creatinine: 1.11 mg/dL — ABNORMAL HIGH (ref 0.44–1.00)
GFR, Estimated: 59 mL/min — ABNORMAL LOW (ref >60)
Glucose, Bld: 100 mg/dL — ABNORMAL HIGH (ref 70–99)
Potassium: 3.3 mmol/L — ABNORMAL LOW (ref 3.5–5.1)
Sodium: 144 mmol/L (ref 135–145)
Total Bilirubin: 0.9 mg/dL (ref 0.3–1.2)
Total Protein: 7.6 g/dL (ref 6.5–8.1)

## 2021-01-11 NOTE — Progress Notes (Signed)
[facility] at [address] [place], [place] [address] [phone]   Interval Evaluation  Date of Service: 01/11/21 Patient Name: [name] Patient MRN: [id number] Patient DOB: December 08, 1965 Provider: Ventura Sellers, MD  Identifying Statement:  [name] is a 55 y.o. female with left temporal glioblastoma   Oncologic History: Oncology History  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[facility])  11/21/2020 Initial Diagnosis   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[facility])   11/21/2020 Cancer Staging   Staging form: Brain and Spinal Cord, AJCC 8th Edition - Pathologic stage from 11/21/2020: WHO Grade IV - Signed by Ventura Sellers, MD on 11/30/2020 Histopathologic type: Glioblastoma Stage prefix: Initial diagnosis Histologic grading system: 4 grade system Extent of surgical resection: Subtotal resection Karnofsky performance status: Score 90 Seizures at presentation: Present Duration of symptoms before diagnosis: Short IDH1 mutation: Negative   12/27/2020 -  Chemotherapy    Patient is on Treatment Plan: BRAIN GLIOBLASTOMA RADIATION THERAPY WITH CONCURRENT TEMOZOLOMIDE 75 MG/M2 DAILY FOLLOWED BY SEQUENTIAL MAINTENANCE TEMOZOLOMIDE X 6-12 CYCLES         Biomarkers:  MGMT Methylated.  IDH 1/2 Wild type.  EGFR Unknown  TERT Unknown   Interval History: [name] presents today for follow up, now having completed 2 weeks of IMRT and Temodar.  She is tolerating treatment well without ill effects, aside from fatigue.  She still works ~4 hours per day.  One episode of lightheaded feeling, thought it could have been a seizure.  Denies headaches.  H+P (11/25/20) Patient presented to medical attention this past month with first ever seizure.  She describes episode of sudden onset "disconnection" with tongue biting and urination, followed by period of confusion.  CNS imaging demonstrated enhancing  mass within left anterior temporal lobe, which was mostly resected by Dr. Zada Finders on 11/21/20.  Since surgery she had not had recurrence of seizures.  She complains of painful cramping of her right leg, which started ~1 week prior.  At this time the cramping is her main complaint.  Otherwise fully functional, independent, would like to return to work if possible.  Medications: Current Outpatient Medications on File Prior to Visit  Medication Sig Dispense Refill   albuterol (VENTOLIN HFA) 108 (90 Base) MCG/ACT inhaler TAKE 2 PUFFS BY MOUTH EVERY 6 HOURS AS NEEDED FOR WHEEZE OR SHORTNESS OF BREATH 8.5 each 0   atorvastatin (LIPITOR) 20 MG tablet Take 1 tablet (20 mg total) by mouth daily. 90 tablet 1   Cholecalciferol (DIALYVITE VITAMIN D 5000) 125 MCG (5000 UT) capsule Take 1 capsule (5,000 Units total) by mouth daily. 90 capsule 3   cyclobenzaprine (FLEXERIL) 5 MG tablet Take 1 tablet (5 mg total) by mouth 3 (three) times daily as needed for muscle spasms. 60 tablet 1   hydrochlorothiazide (HYDRODIURIL) 25 MG tablet TAKE 1 TABLET (25 MG TOTAL) BY MOUTH DAILY. 90 tablet 0   HYDROcodone-acetaminophen (NORCO/VICODIN) 5-325 MG tablet Take 1 tablet by mouth every 4 (four) hours as needed for moderate pain. 30 tablet 0   levETIRAcetam (KEPPRA) 1000 MG tablet Take 1 tablet (1,000 mg total) by mouth 2 (two) times daily. 60 tablet 0   levothyroxine (SYNTHROID) 100 MCG tablet Take 1 tablet (100 mcg total) by mouth daily. 90 tablet 3   losartan (COZAAR) 100 MG tablet TAKE 1 TABLET BY MOUTH EVERY DAY 90 tablet 0   ondansetron (ZOFRAN) 8 MG tablet Take 1 tablet by mouth 2 times daily  as needed (nausea and vomiting). May take 30 - 60 minutes prior to Temodar administration if nausea/vomiting occurs. 30 tablet 1   potassium chloride 20 MEQ TBCR Take 20 mEq by mouth daily. 90 tablet 1   SUMAtriptan (IMITREX) 50 MG tablet Take 50 mg by mouth every 2 (two) hours as needed for migraine. May repeat in 2 hours if  headache persists or recurs. (Patient not taking: Reported on 12/24/2020)     temozolomide (TEMODAR) 140 MG capsule Take 1 capsule (140 mg total) by mouth daily. May take on an empty stomach to decrease nausea & vomiting. 42 capsule 0   No current facility-administered medications on file prior to visit.    Allergies: No Known Allergies Past Medical History:  Past Medical History:  Diagnosis Date   Bell's palsy    left side of face- notied in smile and eyes, if very tired   Brain cancer ([facility])    Complication of anesthesia    N/V   Essential hypertension    External hemorrhoids    History of blood transfusion    post op Hemorroids   History of pneumonia    Hyperlipidemia    Hyperthyroidism    hx 55 years old- oral meds   Iron deficiency anemia    Migraine headache    Pneumonia    x 2 - years ago   PONV (postoperative nausea and vomiting)    PPD positive    Pulmonary nodules    not seen on plain cxr, first detected 1/07 re ct 10/08 pos IPPD > 15 mm 12/09 FOB/lavage 04/20/08 neg afb smear   PVC's (premature ventricular contractions)    Seizure ([facility]) 11/15/2020   Sleep apnea    refused CPAP- mouth piece recommended   Tuberculosis    in her late 30's   Past Surgical History:  Past Surgical History:  Procedure Laterality Date   ABDOMINAL HYSTERECTOMY     APPLICATION OF CRANIAL NAVIGATION N/A 11/21/2020   Procedure: APPLICATION OF CRANIAL NAVIGATION;  Surgeon: Judith Part, MD;  Location: [facility];  Service: Neurosurgery;  Laterality: N/A;   BREAST BIOPSY Right    CHOLECYSTECTOMY  2014   COLONOSCOPY  05/04/2009   prolapsing external hemorrhoids   CRANIOTOMY Left 11/21/2020   Procedure: Left craniotomy for tumor resection;  Surgeon: Judith Part, MD;  Location: [facility];  Service: Neurosurgery;  Laterality: Left;   ENDOMETRIAL ABLATION     HEMORRHOID SURGERY     x 3   TUBAL LIGATION     Social History:  Social History   Socioeconomic History   Marital status:  Divorced    Spouse name: Not on file   Number of children: 2   Years of education: Not on file   Highest education level: Not on file  Occupational History   Occupation: accounts Sport and exercise psychologist: [employer]  Tobacco Use   Smoking status: Never   Smokeless tobacco: Never  Vaping Use   Vaping Use: Never used  Substance and Sexual Activity   Alcohol use: No    Alcohol/week: 0.0 standard drinks   Drug use: No   Sexual activity: Not on file  Other Topics Concern   Not on file  Social History Narrative   Divorced with children   She works in Press photographer    Possible TB exposure 2008 (after nodules found)   From [place] lived there and [place]   Never owned birds      Social Determinants of Health  Financial Resource Strain: Not on file  Food Insecurity: Not on file  Transportation Needs: Not on file  Physical Activity: Not on file  Stress: Not on file  Social Connections: Not on file  Intimate Partner Violence: Not on file   Family History:  Family History  Problem Relation Age of Onset   Heart disease Maternal Grandmother        aunts, uncles, sister   Diabetes Mellitus II Maternal Grandmother    Kidney failure Sister    Diabetes Mellitus II Sister    Lung cancer Other        aunts and uncles   Colon cancer Maternal Aunt        aunts and uncles   Diabetes Mellitus II Maternal Aunt    Colon cancer Maternal Uncle    Diabetes Mellitus II Maternal Uncle    Diabetes type II Mother     Review of Systems: Constitutional: Doesn't report fevers, chills or abnormal weight loss Eyes: Doesn't report blurriness of vision Ears, nose, mouth, throat, and face: Doesn't report sore throat Respiratory: Doesn't report cough, dyspnea or wheezes Cardiovascular: Doesn't report palpitation, chest discomfort  Gastrointestinal:  Doesn't report nausea, constipation, diarrhea GU: Doesn't report incontinence Skin: Doesn't report skin rashes Neurological: Per HPI Musculoskeletal: Doesn't report  joint pain Behavioral/Psych: Doesn't report anxiety  Physical Exam: Vitals:   01/11/21 1007  BP: 131/81  Pulse: 92  Resp: 18  Temp: 98.3 F (36.8 C)  SpO2: 100%   KPS: 90. General: Alert, cooperative, pleasant, in no acute distress Head: Normal EENT: No conjunctival injection or scleral icterus.  Lungs: Resp effort normal Cardiac: Regular rate Abdomen: Non-distended abdomen Skin: No rashes cyanosis or petechiae. Extremities: No clubbing or edema  Neurologic Exam: Mental Status: Awake, alert, attentive to examiner. Oriented to self and environment. Language is fluent with intact comprehension.  Cranial Nerves: Visual acuity is grossly normal. Visual fields are full. Extra-ocular movements intact. No ptosis. Face L LMN paresis, chronic. Motor: Tone and bulk are normal. Power is full in both arms and legs. Reflexes are symmetric, no pathologic reflexes present.  Sensory: Intact to light touch Gait: Normal.   Labs: I have reviewed the data as listed    Component Value Date/Time   NA 135 12/24/2020 1728   K 3.0 (L) 12/24/2020 1728   CL 97 (L) 12/24/2020 1728   CO2 26 12/24/2020 1728   GLUCOSE 156 (H) 12/24/2020 1728   BUN 23 (H) 12/24/2020 1728   CREATININE 1.73 (H) 12/24/2020 1728   CREATININE 0.92 01/25/2020 1424   CALCIUM 10.0 12/24/2020 1728   PROT 7.3 12/24/2020 1853   ALBUMIN 4.0 12/24/2020 1853   AST 19 12/24/2020 1853   ALT 19 12/24/2020 1853   ALKPHOS 94 12/24/2020 1853   BILITOT 2.1 (H) 12/24/2020 1853   GFRNONAA 35 (L) 12/24/2020 1728   GFRNONAA 72 01/25/2020 1424   GFRAA 83 01/25/2020 1424   Lab Results  Component Value Date   WBC 5.5 01/11/2021   NEUTROABS 3.6 01/11/2021   HGB 12.7 01/11/2021   HCT 38.1 01/11/2021   MCV 87.8 01/11/2021   PLT 296 01/11/2021    Assessment/Plan 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[facility]) [C71.9]  Jailynn MILAYA HORA is clinically stable today, now having completed 2 weeks of IMRT and  Temozolomide.    We ultimately recommend continuing with course of intensity modulated radiation therapy and concurrent daily Temozolomide.  Radiation will be administered Mon-Fri over 6 weeks, Temodar will be dosed at 48m/m2  to be given daily over 42 days.  We reviewed side effects of temodar, including fatigue, nausea/vomiting, constipation, and cytopenias.  Informed consent was verbally obtained at bedside to proceed with oral chemotherapy.  Chemotherapy should be held for the following:  ANC less than 1,000  Platelets less than 100,000  LFT or creatinine greater than 2x ULN  If clinical concerns/contraindications develop  Every 2 weeks during radiation, labs will be checked accompanied by a clinical evaluation in the brain tumor clinic.  For now, should continue Keppra 1071m BID for seizure prevention.  All questions were answered. The patient knows to call the clinic with any problems, questions or concerns. No barriers to learning were detected.  The total time spent in the encounter was 30 minutes and more than 50% was on counseling and review of test results   ZVentura Sellers MD Medical Director of Neuro-Oncology [facility] [place]09/08/22 10:08 AM

## 2021-01-12 ENCOUNTER — Ambulatory Visit
Admission: RE | Admit: 2021-01-12 | Discharge: 2021-01-12 | Disposition: A | Discharge status: Home / Self Care | Payer: BC Managed Care – PPO | Source: Ambulatory Visit | Attending: Radiation Oncology | Admitting: Radiation Oncology

## 2021-01-12 DIAGNOSIS — C712 Malignant neoplasm of temporal lobe: Secondary | ICD-10-CM | POA: Diagnosis not present

## 2021-01-12 DIAGNOSIS — Z510 Encounter for antineoplastic radiation therapy: Secondary | ICD-10-CM | POA: Diagnosis not present

## 2021-01-15 ENCOUNTER — Other Ambulatory Visit: Payer: Self-pay

## 2021-01-15 ENCOUNTER — Ambulatory Visit
Admission: RE | Admit: 2021-01-15 | Discharge: 2021-01-15 | Disposition: A | Discharge status: Home / Self Care | Payer: BC Managed Care – PPO | Source: Ambulatory Visit | Attending: Radiation Oncology | Admitting: Radiation Oncology

## 2021-01-15 DIAGNOSIS — Z510 Encounter for antineoplastic radiation therapy: Secondary | ICD-10-CM | POA: Diagnosis not present

## 2021-01-15 DIAGNOSIS — C712 Malignant neoplasm of temporal lobe: Secondary | ICD-10-CM | POA: Diagnosis not present

## 2021-01-16 ENCOUNTER — Ambulatory Visit
Admission: RE | Admit: 2021-01-16 | Discharge: 2021-01-16 | Disposition: A | Discharge status: Home / Self Care | Payer: BC Managed Care – PPO | Source: Ambulatory Visit | Attending: Radiation Oncology | Admitting: Radiation Oncology

## 2021-01-16 DIAGNOSIS — C712 Malignant neoplasm of temporal lobe: Secondary | ICD-10-CM | POA: Diagnosis not present

## 2021-01-16 DIAGNOSIS — Z510 Encounter for antineoplastic radiation therapy: Secondary | ICD-10-CM | POA: Diagnosis not present

## 2021-01-17 ENCOUNTER — Other Ambulatory Visit: Payer: Self-pay

## 2021-01-17 ENCOUNTER — Ambulatory Visit
Admission: RE | Admit: 2021-01-17 | Discharge: 2021-01-17 | Disposition: A | Discharge status: Home / Self Care | Payer: BC Managed Care – PPO | Source: Ambulatory Visit | Attending: Radiation Oncology | Admitting: Radiation Oncology

## 2021-01-17 DIAGNOSIS — Z510 Encounter for antineoplastic radiation therapy: Secondary | ICD-10-CM | POA: Diagnosis not present

## 2021-01-17 DIAGNOSIS — C712 Malignant neoplasm of temporal lobe: Secondary | ICD-10-CM | POA: Diagnosis not present

## 2021-01-18 ENCOUNTER — Ambulatory Visit
Admission: RE | Admit: 2021-01-18 | Discharge: 2021-01-18 | Disposition: A | Discharge status: Home / Self Care | Payer: BC Managed Care – PPO | Source: Ambulatory Visit | Attending: Radiation Oncology | Admitting: Radiation Oncology

## 2021-01-18 ENCOUNTER — Other Ambulatory Visit: Payer: Self-pay | Admitting: Internal Medicine

## 2021-01-18 DIAGNOSIS — Z510 Encounter for antineoplastic radiation therapy: Secondary | ICD-10-CM | POA: Diagnosis not present

## 2021-01-18 DIAGNOSIS — C712 Malignant neoplasm of temporal lobe: Secondary | ICD-10-CM | POA: Diagnosis not present

## 2021-01-18 DIAGNOSIS — C719 Malignant neoplasm of brain, unspecified: Secondary | ICD-10-CM

## 2021-01-19 ENCOUNTER — Encounter: Payer: Self-pay | Admitting: Internal Medicine

## 2021-01-19 ENCOUNTER — Other Ambulatory Visit: Payer: Self-pay

## 2021-01-19 ENCOUNTER — Ambulatory Visit
Admission: RE | Admit: 2021-01-19 | Discharge: 2021-01-19 | Disposition: A | Discharge status: Home / Self Care | Payer: BC Managed Care – PPO | Source: Ambulatory Visit | Attending: Radiation Oncology | Admitting: Radiation Oncology

## 2021-01-19 DIAGNOSIS — Z510 Encounter for antineoplastic radiation therapy: Secondary | ICD-10-CM | POA: Diagnosis not present

## 2021-01-19 DIAGNOSIS — C712 Malignant neoplasm of temporal lobe: Secondary | ICD-10-CM | POA: Diagnosis not present

## 2021-01-22 ENCOUNTER — Other Ambulatory Visit: Payer: Self-pay

## 2021-01-22 ENCOUNTER — Ambulatory Visit
Admission: RE | Admit: 2021-01-22 | Discharge: 2021-01-22 | Disposition: A | Discharge status: Home / Self Care | Payer: BC Managed Care – PPO | Source: Ambulatory Visit | Attending: Radiation Oncology | Admitting: Radiation Oncology

## 2021-01-22 DIAGNOSIS — Z510 Encounter for antineoplastic radiation therapy: Secondary | ICD-10-CM | POA: Diagnosis not present

## 2021-01-22 DIAGNOSIS — C712 Malignant neoplasm of temporal lobe: Secondary | ICD-10-CM | POA: Diagnosis not present

## 2021-01-23 ENCOUNTER — Ambulatory Visit
Admission: RE | Admit: 2021-01-23 | Discharge: 2021-01-23 | Disposition: A | Discharge status: Home / Self Care | Payer: BC Managed Care – PPO | Source: Ambulatory Visit | Attending: Radiation Oncology | Admitting: Radiation Oncology

## 2021-01-23 DIAGNOSIS — Z510 Encounter for antineoplastic radiation therapy: Secondary | ICD-10-CM | POA: Diagnosis not present

## 2021-01-23 DIAGNOSIS — C712 Malignant neoplasm of temporal lobe: Secondary | ICD-10-CM | POA: Diagnosis not present

## 2021-01-24 ENCOUNTER — Other Ambulatory Visit: Payer: Self-pay

## 2021-01-24 ENCOUNTER — Ambulatory Visit
Admission: RE | Admit: 2021-01-24 | Discharge: 2021-01-24 | Disposition: A | Discharge status: Home / Self Care | Payer: BC Managed Care – PPO | Source: Ambulatory Visit | Attending: Radiation Oncology | Admitting: Radiation Oncology

## 2021-01-24 DIAGNOSIS — C712 Malignant neoplasm of temporal lobe: Secondary | ICD-10-CM | POA: Diagnosis not present

## 2021-01-24 DIAGNOSIS — Z510 Encounter for antineoplastic radiation therapy: Secondary | ICD-10-CM | POA: Diagnosis not present

## 2021-01-25 ENCOUNTER — Inpatient Hospital Stay (HOSPITAL_BASED_OUTPATIENT_CLINIC_OR_DEPARTMENT_OTHER): Payer: BC Managed Care – PPO | Admitting: Internal Medicine

## 2021-01-25 ENCOUNTER — Inpatient Hospital Stay: Payer: BC Managed Care – PPO

## 2021-01-25 ENCOUNTER — Ambulatory Visit
Admission: RE | Admit: 2021-01-25 | Discharge: 2021-01-25 | Disposition: A | Discharge status: Home / Self Care | Payer: BC Managed Care – PPO | Source: Ambulatory Visit | Attending: Radiation Oncology | Admitting: Radiation Oncology

## 2021-01-25 VITALS — BP 117/82 | HR 87 | Temp 98.6°F | Resp 18 | Ht 68.0 in | Wt 176.5 lb

## 2021-01-25 DIAGNOSIS — Z923 Personal history of irradiation: Secondary | ICD-10-CM | POA: Diagnosis not present

## 2021-01-25 DIAGNOSIS — Z79899 Other long term (current) drug therapy: Secondary | ICD-10-CM | POA: Diagnosis not present

## 2021-01-25 DIAGNOSIS — R569 Unspecified convulsions: Secondary | ICD-10-CM

## 2021-01-25 DIAGNOSIS — C719 Malignant neoplasm of brain, unspecified: Secondary | ICD-10-CM

## 2021-01-25 DIAGNOSIS — C712 Malignant neoplasm of temporal lobe: Secondary | ICD-10-CM | POA: Diagnosis not present

## 2021-01-25 DIAGNOSIS — Z510 Encounter for antineoplastic radiation therapy: Secondary | ICD-10-CM | POA: Diagnosis not present

## 2021-01-25 LAB — CMP (CANCER CENTER ONLY)
ALT: 23 U/L (ref 0–44)
AST: 16 U/L (ref 15–41)
Albumin: 4.2 g/dL (ref 3.5–5.0)
Alkaline Phosphatase: 99 U/L (ref 38–126)
Anion gap: 11 (ref 5–15)
BUN: 9 mg/dL (ref 6–20)
CO2: 29 mmol/L (ref 22–32)
Calcium: 10.5 mg/dL — ABNORMAL HIGH (ref 8.9–10.3)
Chloride: 102 mmol/L (ref 98–111)
Creatinine: 1.09 mg/dL — ABNORMAL HIGH (ref 0.44–1.00)
GFR, Estimated: >60 mL/min (ref >60)
Glucose, Bld: 115 mg/dL — ABNORMAL HIGH (ref 70–99)
Potassium: 3.4 mmol/L — ABNORMAL LOW (ref 3.5–5.1)
Sodium: 142 mmol/L (ref 135–145)
Total Bilirubin: 1.3 mg/dL — ABNORMAL HIGH (ref 0.3–1.2)
Total Protein: 7.3 g/dL (ref 6.5–8.1)

## 2021-01-25 LAB — CBC WITH DIFFERENTIAL (CANCER CENTER ONLY)
Abs Immature Granulocytes: 0.02 10*3/uL (ref 0.00–0.07)
Basophils Absolute: 0 10*3/uL (ref 0.0–0.1)
Basophils Relative: 1 %
Eosinophils Absolute: 0.2 10*3/uL (ref 0.0–0.5)
Eosinophils Relative: 5 %
HCT: 38.4 % (ref 36.0–46.0)
Hemoglobin: 12.8 g/dL (ref 12.0–15.0)
Immature Granulocytes: 0 %
Lymphocytes Relative: 18 %
Lymphs Abs: 0.9 10*3/uL (ref 0.7–4.0)
MCH: 29.3 pg (ref 26.0–34.0)
MCHC: 33.3 g/dL (ref 30.0–36.0)
MCV: 87.9 fL (ref 80.0–100.0)
Monocytes Absolute: 0.4 10*3/uL (ref 0.1–1.0)
Monocytes Relative: 8 %
Neutro Abs: 3.4 10*3/uL (ref 1.7–7.7)
Neutrophils Relative %: 68 %
Platelet Count: 264 10*3/uL (ref 150–400)
RBC: 4.37 MIL/uL (ref 3.87–5.11)
RDW: 13.2 % (ref 11.5–15.5)
WBC Count: 4.9 10*3/uL (ref 4.0–10.5)
nRBC: 0 % (ref 0.0–0.2)

## 2021-01-25 MED ORDER — LORAZEPAM 2 MG PO TABS: 2.0000 mg | ORAL_TABLET | Freq: Three times a day (TID) | ORAL | 0 refills | Status: DC | PRN

## 2021-01-25 MED ORDER — LEVETIRACETAM 750 MG PO TABS: 1500.0000 mg | ORAL_TABLET | Freq: Two times a day (BID) | ORAL | 3 refills | Status: DC

## 2021-01-25 NOTE — Progress Notes (Signed)
[facility] at [place] [place], [place] [address] [phone]   Interval Evaluation  Date of Service: 01/25/21 Patient Name: [name] Patient MRN: [id number] Patient DOB: [birth year] Provider: Ventura Sellers, MD  Identifying Statement:  [name] is a 55 y.o. female with left temporal glioblastoma   Oncologic History: Oncology History  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[facility])  11/21/2020 Initial Diagnosis   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[facility])   11/21/2020 Cancer Staging   Staging form: Brain and Spinal Cord, AJCC 8th Edition - Pathologic stage from 11/21/2020: WHO Grade IV - Signed by Ventura Sellers, MD on 11/30/2020 Histopathologic type: Glioblastoma Stage prefix: Initial diagnosis Histologic grading system: 4 grade system Extent of surgical resection: Subtotal resection Karnofsky performance status: Score 90 Seizures at presentation: Present Duration of symptoms before diagnosis: Short IDH1 mutation: Negative   12/27/2020 -  Chemotherapy    Patient is on Treatment Plan: BRAIN GLIOBLASTOMA RADIATION THERAPY WITH CONCURRENT TEMOZOLOMIDE 75 MG/M2 DAILY FOLLOWED BY SEQUENTIAL MAINTENANCE TEMOZOLOMIDE X 6-12 CYCLES         Biomarkers:  MGMT Methylated.  IDH 1/2 Wild type.  EGFR Unknown  TERT Unknown   Interval History: JANAN BOGIE presents today for follow up, now having completed 4 weeks of IMRT and Temodar.  She is tolerating treatment well without ill effects, aside from ongoing fatigue. She does report two seizures in past two weeks, both same semiology.  No clear provocation aside from some stress.  Denies headaches.  H+P (11/25/20) Patient presented to medical attention this past month with first ever seizure.  She describes episode of sudden onset "disconnection" with tongue biting and urination, followed by period of confusion.  CNS imaging  demonstrated enhancing mass within left anterior temporal lobe, which was mostly resected by Dr. Zada Finders on 11/21/20.  Since surgery she had not had recurrence of seizures.  She complains of painful cramping of her right leg, which started ~1 week prior.  At this time the cramping is her main complaint.  Otherwise fully functional, independent, would like to return to work if possible.  Medications: Current Outpatient Medications on File Prior to Visit  Medication Sig Dispense Refill   atorvastatin (LIPITOR) 20 MG tablet Take 1 tablet (20 mg total) by mouth daily. 90 tablet 1   Cholecalciferol (DIALYVITE VITAMIN D 5000) 125 MCG (5000 UT) capsule Take 1 capsule (5,000 Units total) by mouth daily. 90 capsule 3   hydrochlorothiazide (HYDRODIURIL) 25 MG tablet TAKE 1 TABLET (25 MG TOTAL) BY MOUTH DAILY. 90 tablet 0   levETIRAcetam (KEPPRA) 1000 MG tablet Take 1 tablet (1,000 mg total) by mouth 2 (two) times daily. 60 tablet 0   levothyroxine (SYNTHROID) 100 MCG tablet Take 1 tablet (100 mcg total) by mouth daily. 90 tablet 3   losartan (COZAAR) 100 MG tablet TAKE 1 TABLET BY MOUTH EVERY DAY 90 tablet 0   ondansetron (ZOFRAN) 8 MG tablet Take 1 tablet by mouth 2 times daily as needed (nausea and vomiting). May take 30 - 60 minutes prior to Temodar administration if nausea/vomiting occurs. 30 tablet 1   potassium chloride 20 MEQ TBCR Take 20 mEq by mouth daily. 90 tablet 1   temozolomide (TEMODAR) 140 MG capsule TAKE 1 CAPSULE (140 MG TOTAL) BY MOUTH DAILY. MAY TAKE ON AN EMPTY STOMACH TO DECREASE NAUSEA & VOMITING. 12 capsule 0   No current facility-administered medications on file prior to visit.  Allergies: No Known Allergies Past Medical History:  Past Medical History:  Diagnosis Date   Bell's palsy    left side of face- notied in smile and eyes, if very tired   Brain cancer ([facility])    Complication of anesthesia    N/V   Essential hypertension    External hemorrhoids    History of blood  transfusion    post op Hemorroids   History of pneumonia    Hyperlipidemia    Hyperthyroidism    hx 55 years old- oral meds   Iron deficiency anemia    Migraine headache    Pneumonia    x 2 - years ago   PONV (postoperative nausea and vomiting)    PPD positive    Pulmonary nodules    not seen on plain cxr, first detected 1/07 re ct 10/08 pos IPPD > 15 mm 12/09 FOB/lavage 04/20/08 neg afb smear   PVC's (premature ventricular contractions)    Seizure ([facility]) 11/15/2020   Sleep apnea    refused CPAP- mouth piece recommended   Tuberculosis    in her late 30's   Past Surgical History:  Past Surgical History:  Procedure Laterality Date   ABDOMINAL HYSTERECTOMY     APPLICATION OF CRANIAL NAVIGATION N/A 11/21/2020   Procedure: APPLICATION OF CRANIAL NAVIGATION;  Surgeon: Judith Part, MD;  Location: [facility];  Service: Neurosurgery;  Laterality: N/A;   BREAST BIOPSY Right    CHOLECYSTECTOMY  2014   COLONOSCOPY  05/04/2009   prolapsing external hemorrhoids   CRANIOTOMY Left 11/21/2020   Procedure: Left craniotomy for tumor resection;  Surgeon: Judith Part, MD;  Location: [facility];  Service: Neurosurgery;  Laterality: Left;   ENDOMETRIAL ABLATION     HEMORRHOID SURGERY     x 3   TUBAL LIGATION     Social History:  Social History   Socioeconomic History   Marital status: Divorced    Spouse name: Not on file   Number of children: 2   Years of education: Not on file   Highest education level: Not on file  Occupational History   Occupation: accounts Sport and exercise psychologist: [employer]  Tobacco Use   Smoking status: Never   Smokeless tobacco: Never  Vaping Use   Vaping Use: Never used  Substance and Sexual Activity   Alcohol use: No    Alcohol/week: 0.0 standard drinks   Drug use: No   Sexual activity: Not on file  Other Topics Concern   Not on file  Social History Narrative   Divorced with children   She works in Press photographer    Possible TB exposure 2008 (after nodules  found)   From [place] lived there and [place]   Never owned birds      Social Determinants of Radio broadcast assistant Strain: Not on file  Food Insecurity: Not on file  Transportation Needs: Not on file  Physical Activity: Not on file  Stress: Not on file  Social Connections: Not on file  Intimate Partner Violence: Not on file   Family History:  Family History  Problem Relation Age of Onset   Heart disease Maternal Grandmother        aunts, uncles, sister   Diabetes Mellitus II Maternal Grandmother    Kidney failure Sister    Diabetes Mellitus II Sister    Lung cancer Other        aunts and uncles   Colon cancer Maternal Aunt  aunts and uncles   Diabetes Mellitus II Maternal Aunt    Colon cancer Maternal Uncle    Diabetes Mellitus II Maternal Uncle    Diabetes type II Mother     Review of Systems: Constitutional: Doesn't report fevers, chills or abnormal weight loss Eyes: Doesn't report blurriness of vision Ears, nose, mouth, throat, and face: Doesn't report sore throat Respiratory: Doesn't report cough, dyspnea or wheezes Cardiovascular: Doesn't report palpitation, chest discomfort  Gastrointestinal:  Doesn't report nausea, constipation, diarrhea GU: Doesn't report incontinence Skin: Doesn't report skin rashes Neurological: Per HPI Musculoskeletal: Doesn't report joint pain Behavioral/Psych: Doesn't report anxiety  Physical Exam: Vitals:   01/25/21 1016  BP: 117/82  Pulse: 87  Resp: 18  Temp: 98.6 F (37 C)  SpO2: 100%    KPS: 90. General: Alert, cooperative, pleasant, in no acute distress Head: Normal EENT: No conjunctival injection or scleral icterus.  Lungs: Resp effort normal Cardiac: Regular rate Abdomen: Non-distended abdomen Skin: No rashes cyanosis or petechiae. Extremities: No clubbing or edema  Neurologic Exam: Mental Status: Awake, alert, attentive to examiner. Oriented to self and environment. Language is fluent with intact  comprehension.  Cranial Nerves: Visual acuity is grossly normal. Visual fields are full. Extra-ocular movements intact. No ptosis. Face L LMN paresis, chronic. Motor: Tone and bulk are normal. Power is full in both arms and legs. Reflexes are symmetric, no pathologic reflexes present.  Sensory: Intact to light touch Gait: Normal.   Labs: I have reviewed the data as listed    Component Value Date/Time   NA 144 01/11/2021 0933   K 3.3 (L) 01/11/2021 0933   CL 103 01/11/2021 0933   CO2 28 01/11/2021 0933   GLUCOSE 100 (H) 01/11/2021 0933   BUN 12 01/11/2021 0933   CREATININE 1.11 (H) 01/11/2021 0933   CREATININE 0.92 01/25/2020 1424   CALCIUM 10.5 (H) 01/11/2021 0933   PROT 7.6 01/11/2021 0933   ALBUMIN 4.3 01/11/2021 0933   AST 15 01/11/2021 0933   ALT 23 01/11/2021 0933   ALKPHOS 101 01/11/2021 0933   BILITOT 0.9 01/11/2021 0933   GFRNONAA 59 (L) 01/11/2021 0933   GFRNONAA 72 01/25/2020 1424   GFRAA 83 01/25/2020 1424   Lab Results  Component Value Date   WBC 5.5 01/11/2021   NEUTROABS 3.6 01/11/2021   HGB 12.7 01/11/2021   HCT 38.1 01/11/2021   MCV 87.8 01/11/2021   PLT 296 01/11/2021    Assessment/Plan 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[facility]) [C71.9]  Kristine REHEMA MUFFLEY is clinically stable today, now having completed 4 weeks of IMRT and Temozolomide.    We ultimately recommend continuing with course of intensity modulated radiation therapy and concurrent daily Temozolomide.  Radiation will be administered Mon-Fri over 6 weeks, Temodar will be dosed at 60m/m2 to be given daily over 42 days.  We reviewed side effects of temodar, including fatigue, nausea/vomiting, constipation, and cytopenias.  Chemotherapy should be held for the following:  ANC less than 1,000  Platelets less than 100,000  LFT or creatinine greater than 2x ULN  If clinical concerns/contraindications develop  Every 2 weeks during radiation, labs will be checked accompanied  by a clinical evaluation in the brain tumor clinic.  For breakthrough seizures, we will increase Keppra to [id number].  Will also provide rescue ativan, 45m51mO PRN seizure aura.  All questions were answered. The patient knows to call the clinic with any problems, questions or concerns. No barriers to learning were detected.  The total time  spent in the encounter was 30 minutes and more than 50% was on counseling and review of test results   Ventura Sellers, MD Medical Director of Neuro-Oncology [facility] at [facility] 01/25/21 10:08 AM

## 2021-01-26 ENCOUNTER — Ambulatory Visit
Admission: RE | Admit: 2021-01-26 | Discharge: 2021-01-26 | Disposition: A | Discharge status: Home / Self Care | Payer: BC Managed Care – PPO | Source: Ambulatory Visit | Attending: Radiation Oncology | Admitting: Radiation Oncology

## 2021-01-26 ENCOUNTER — Other Ambulatory Visit: Payer: Self-pay

## 2021-01-26 ENCOUNTER — Other Ambulatory Visit: Payer: Self-pay | Admitting: Radiation Oncology

## 2021-01-26 ENCOUNTER — Other Ambulatory Visit: Payer: Self-pay | Admitting: Adult Health

## 2021-01-26 DIAGNOSIS — C712 Malignant neoplasm of temporal lobe: Secondary | ICD-10-CM

## 2021-01-26 DIAGNOSIS — Z510 Encounter for antineoplastic radiation therapy: Secondary | ICD-10-CM | POA: Diagnosis not present

## 2021-01-26 MED ORDER — DEXAMETHASONE 4 MG PO TABS: ORAL_TABLET | ORAL | 0 refills | Status: DC

## 2021-01-29 ENCOUNTER — Ambulatory Visit
Admission: RE | Admit: 2021-01-29 | Discharge: 2021-01-29 | Disposition: A | Discharge status: Home / Self Care | Payer: BC Managed Care – PPO | Source: Ambulatory Visit | Attending: Radiation Oncology | Admitting: Radiation Oncology

## 2021-01-29 ENCOUNTER — Other Ambulatory Visit: Payer: Self-pay | Admitting: Radiation Oncology

## 2021-01-29 DIAGNOSIS — C712 Malignant neoplasm of temporal lobe: Secondary | ICD-10-CM

## 2021-01-29 DIAGNOSIS — Z510 Encounter for antineoplastic radiation therapy: Secondary | ICD-10-CM | POA: Diagnosis not present

## 2021-01-29 MED ORDER — OMEPRAZOLE 20 MG PO CPDR: 20.0000 mg | DELAYED_RELEASE_CAPSULE | Freq: Every day | ORAL | 1 refills | Status: DC

## 2021-01-30 ENCOUNTER — Other Ambulatory Visit: Payer: Self-pay

## 2021-01-30 ENCOUNTER — Ambulatory Visit
Admission: RE | Admit: 2021-01-30 | Discharge: 2021-01-30 | Disposition: A | Discharge status: Home / Self Care | Payer: BC Managed Care – PPO | Source: Ambulatory Visit | Attending: Radiation Oncology | Admitting: Radiation Oncology

## 2021-01-30 DIAGNOSIS — Z510 Encounter for antineoplastic radiation therapy: Secondary | ICD-10-CM | POA: Diagnosis not present

## 2021-01-30 DIAGNOSIS — C712 Malignant neoplasm of temporal lobe: Secondary | ICD-10-CM | POA: Diagnosis not present

## 2021-01-31 ENCOUNTER — Ambulatory Visit
Admission: RE | Admit: 2021-01-31 | Discharge: 2021-01-31 | Disposition: A | Discharge status: Home / Self Care | Payer: BC Managed Care – PPO | Source: Ambulatory Visit | Attending: Radiation Oncology | Admitting: Radiation Oncology

## 2021-01-31 DIAGNOSIS — C712 Malignant neoplasm of temporal lobe: Secondary | ICD-10-CM | POA: Diagnosis not present

## 2021-01-31 DIAGNOSIS — Z510 Encounter for antineoplastic radiation therapy: Secondary | ICD-10-CM | POA: Diagnosis not present

## 2021-02-01 ENCOUNTER — Ambulatory Visit
Admission: RE | Admit: 2021-02-01 | Discharge: 2021-02-01 | Disposition: A | Discharge status: Home / Self Care | Payer: BC Managed Care – PPO | Source: Ambulatory Visit | Attending: Radiation Oncology | Admitting: Radiation Oncology

## 2021-02-01 ENCOUNTER — Other Ambulatory Visit: Payer: Self-pay

## 2021-02-01 DIAGNOSIS — Z510 Encounter for antineoplastic radiation therapy: Secondary | ICD-10-CM | POA: Diagnosis not present

## 2021-02-01 DIAGNOSIS — C712 Malignant neoplasm of temporal lobe: Secondary | ICD-10-CM | POA: Diagnosis not present

## 2021-02-02 ENCOUNTER — Ambulatory Visit
Admission: RE | Admit: 2021-02-02 | Discharge: 2021-02-02 | Disposition: A | Discharge status: Home / Self Care | Payer: BC Managed Care – PPO | Source: Ambulatory Visit | Attending: Radiation Oncology | Admitting: Radiation Oncology

## 2021-02-02 DIAGNOSIS — C712 Malignant neoplasm of temporal lobe: Secondary | ICD-10-CM | POA: Diagnosis not present

## 2021-02-02 DIAGNOSIS — Z510 Encounter for antineoplastic radiation therapy: Secondary | ICD-10-CM | POA: Diagnosis not present

## 2021-02-02 NOTE — Progress Notes (Signed)
Notified by therapist on L1 that patient appeared to be having a seizure in the dressing room. Went over to [facility] area and observed patient seated and leaning on her friend. Her arms were pulled in tight to her chest, her eyes were closed, her head was resting on her friend's shoulder, and her legs appeared to be shaky (same presentation as when she asked to be brought around to see Dr. Isidore Moos last Friday 01/26/21) . Friend stated episode started about 1 minute from my arrival, and patient was able to talk as episode ended (Total time between 2-3 minutes). She stated she had not taken her lorazepam when she felt symptoms coming on. She asked for time to rest before her treatment, so lights were dimmed in room and assistance cord placed in patient's hand. Asked therapists to call me back to machine when they were ready to treat patient. Called over when patient was ready, and observed patient on camera during treatment. Patient able to complete full treatment without incident.   While assisting patient out to friends car via wheelchair, she complained of an intense left-sided pain to her head. Retrieved patient's lorazepam from her purse, and helped her place 1 tablet under her tongue. Patient denied any other symptoms other than extreme fatigue. After about 5-7 minutes, patient stated pain had resolved and she wanted to go home to rest. Assisted patient upstairs via wheelchair, and waited with her until her friend's car arrived from [place]. Helped patient into car without difficulty or incident. Encouraged patient (or her friend) to call me directly should they have any concerns before 5 pm today, and the on-call triage line after 5pm or over the weekend. They know to call EMS to take patient to ED should they not feel comfortable with patient's condition/status.  Both verbalized understanding and agreement. Will see patient again Monday 02/05/21 after her treatment

## 2021-02-03 ENCOUNTER — Other Ambulatory Visit: Payer: Self-pay | Admitting: Adult Health

## 2021-02-04 ENCOUNTER — Other Ambulatory Visit: Payer: Self-pay | Admitting: Adult Health

## 2021-02-04 DIAGNOSIS — I10 Essential (primary) hypertension: Secondary | ICD-10-CM

## 2021-02-05 ENCOUNTER — Inpatient Hospital Stay: Payer: BC Managed Care – PPO

## 2021-02-05 ENCOUNTER — Ambulatory Visit
Admission: RE | Admit: 2021-02-05 | Discharge: 2021-02-05 | Disposition: A | Discharge status: Home / Self Care | Payer: BC Managed Care – PPO | Source: Ambulatory Visit | Attending: Radiation Oncology | Admitting: Radiation Oncology

## 2021-02-05 ENCOUNTER — Inpatient Hospital Stay (HOSPITAL_BASED_OUTPATIENT_CLINIC_OR_DEPARTMENT_OTHER): Payer: BC Managed Care – PPO | Admitting: Internal Medicine

## 2021-02-05 ENCOUNTER — Other Ambulatory Visit: Payer: Self-pay

## 2021-02-05 DIAGNOSIS — C719 Malignant neoplasm of brain, unspecified: Secondary | ICD-10-CM | POA: Diagnosis not present

## 2021-02-05 DIAGNOSIS — Z808 Family history of malignant neoplasm of other organs or systems: Secondary | ICD-10-CM | POA: Insufficient documentation

## 2021-02-05 DIAGNOSIS — R569 Unspecified convulsions: Secondary | ICD-10-CM

## 2021-02-05 DIAGNOSIS — C712 Malignant neoplasm of temporal lobe: Secondary | ICD-10-CM | POA: Insufficient documentation

## 2021-02-05 DIAGNOSIS — I10 Essential (primary) hypertension: Secondary | ICD-10-CM | POA: Insufficient documentation

## 2021-02-05 DIAGNOSIS — Z510 Encounter for antineoplastic radiation therapy: Secondary | ICD-10-CM | POA: Diagnosis not present

## 2021-02-05 DIAGNOSIS — Z801 Family history of malignant neoplasm of trachea, bronchus and lung: Secondary | ICD-10-CM | POA: Insufficient documentation

## 2021-02-05 LAB — CBC WITH DIFFERENTIAL (CANCER CENTER ONLY)
Abs Immature Granulocytes: 0.06 10*3/uL (ref 0.00–0.07)
Basophils Absolute: 0 10*3/uL (ref 0.0–0.1)
Basophils Relative: 0 %
Eosinophils Absolute: 0.1 10*3/uL (ref 0.0–0.5)
Eosinophils Relative: 1 %
HCT: 36.9 % (ref 36.0–46.0)
Hemoglobin: 12.3 g/dL (ref 12.0–15.0)
Immature Granulocytes: 1 %
Lymphocytes Relative: 17 %
Lymphs Abs: 1.4 10*3/uL (ref 0.7–4.0)
MCH: 29.2 pg (ref 26.0–34.0)
MCHC: 33.3 g/dL (ref 30.0–36.0)
MCV: 87.6 fL (ref 80.0–100.0)
Monocytes Absolute: 0.6 10*3/uL (ref 0.1–1.0)
Monocytes Relative: 7 %
Neutro Abs: 6.3 10*3/uL (ref 1.7–7.7)
Neutrophils Relative %: 74 %
Platelet Count: 241 10*3/uL (ref 150–400)
RBC: 4.21 MIL/uL (ref 3.87–5.11)
RDW: 13.5 % (ref 11.5–15.5)
WBC Count: 8.5 10*3/uL (ref 4.0–10.5)
nRBC: 0 % (ref 0.0–0.2)

## 2021-02-05 LAB — CMP (CANCER CENTER ONLY)
ALT: 16 U/L (ref 0–44)
AST: 10 U/L — ABNORMAL LOW (ref 15–41)
Albumin: 3.8 g/dL (ref 3.5–5.0)
Alkaline Phosphatase: 96 U/L (ref 38–126)
Anion gap: 12 (ref 5–15)
BUN: 19 mg/dL (ref 6–20)
CO2: 26 mmol/L (ref 22–32)
Calcium: 9.4 mg/dL (ref 8.9–10.3)
Chloride: 103 mmol/L (ref 98–111)
Creatinine: 1.09 mg/dL — ABNORMAL HIGH (ref 0.44–1.00)
GFR, Estimated: >60 mL/min (ref >60)
Glucose, Bld: 136 mg/dL — ABNORMAL HIGH (ref 70–99)
Potassium: 3 mmol/L — ABNORMAL LOW (ref 3.5–5.1)
Sodium: 141 mmol/L (ref 135–145)
Total Bilirubin: 0.8 mg/dL (ref 0.3–1.2)
Total Protein: 6.4 g/dL — ABNORMAL LOW (ref 6.5–8.1)

## 2021-02-05 MED ORDER — LACOSAMIDE 50 MG PO TABS: 50.0000 mg | ORAL_TABLET | Freq: Two times a day (BID) | ORAL | 3 refills | Status: DC

## 2021-02-05 NOTE — Progress Notes (Signed)
[facility] at [facility] [place], [place] [address] [phone]   Interval Evaluation  Date of Service: 02/05/21 Patient Name: [name] Patient MRN: [id number] Patient DOB: 1965/07/08 Provider: Ventura Sellers, MD  Identifying Statement:  [name] is a 55 y.o. female with left temporal glioblastoma   Oncologic History: Oncology History  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[place])  11/21/2020 Initial Diagnosis   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[place])   11/21/2020 Cancer Staging   Staging form: Brain and Spinal Cord, AJCC 8th Edition - Pathologic stage from 11/21/2020: WHO Grade IV - Signed by Ventura Sellers, MD on 11/30/2020 Histopathologic type: Glioblastoma Stage prefix: Initial diagnosis Histologic grading system: 4 grade system Extent of surgical resection: Subtotal resection Karnofsky performance status: Score 90 Seizures at presentation: Present Duration of symptoms before diagnosis: Short IDH1 mutation: Negative   12/27/2020 -  Chemotherapy    Patient is on Treatment Plan: BRAIN GLIOBLASTOMA RADIATION THERAPY WITH CONCURRENT TEMOZOLOMIDE 75 MG/M2 DAILY FOLLOWED BY SEQUENTIAL MAINTENANCE TEMOZOLOMIDE X 6-12 CYCLES         Biomarkers:  MGMT Methylated.  IDH 1/2 Wild type.  EGFR Unknown  TERT Unknown   Interval History: [name] presents today for follow up, now in final week of IMRT and Temodar.  She has again experienced multiple seizures over the past week, including one event just prior to radiation last friday.  Most recent episode was this Saturday.  Semiology has been unchanged, with speech arrest.  She additionally feels very tired and dizzy at times.  Has been dosing decadron 61m daily.  Denies headaches.  H+P (11/25/20) Patient presented to medical attention this past month with first ever seizure.  She describes episode of sudden onset  "disconnection" with tongue biting and urination, followed by period of confusion.  CNS imaging demonstrated enhancing mass within left anterior temporal lobe, which was mostly resected by Dr. OZada Finderson 11/21/20.  Since surgery she had not had recurrence of seizures.  She complains of painful cramping of her right leg, which started ~1 week prior.  At this time the cramping is her main complaint.  Otherwise fully functional, independent, would like to return to work if possible.  Medications: Current Outpatient Medications on File Prior to Visit  Medication Sig Dispense Refill   atorvastatin (LIPITOR) 20 MG tablet Take 1 tablet (20 mg total) by mouth daily. 90 tablet 1   [employer] D3 125 MCG (5000 UT) capsule TAKE 1 CAPSULE (5,000 UNITS TOTAL) BY MOUTH DAILY. 90 capsule 3   dexamethasone (DECADRON) 4 MG tablet Take 1 tablet twice a day with food for 3 days, then 1 tablet daily with food until you see Dr. VMickeal Skinner 30 tablet 0   hydrochlorothiazide (HYDRODIURIL) 25 MG tablet TAKE 1 TABLET (25 MG TOTAL) BY MOUTH DAILY. 90 tablet 0   levETIRAcetam (KEPPRA) 750 MG tablet Take 2 tablets (1,500 mg total) by mouth 2 (two) times daily. 120 tablet 3   levothyroxine (SYNTHROID) 100 MCG tablet Take 1 tablet (100 mcg total) by mouth daily. 90 tablet 3   LORazepam (ATIVAN) 2 MG tablet Take 1 tablet (2 mg total) by mouth every 8 (eight) hours as needed for seizure. 12 tablet 0   losartan (COZAAR) 100 MG tablet TAKE 1 TABLET BY MOUTH EVERY DAY 90 tablet 0   omeprazole (PRILOSEC) 20 MG capsule Take 1 capsule (20 mg total) by mouth daily. Take while on Dexamethasone. 30 capsule  1   ondansetron (ZOFRAN) 8 MG tablet Take 1 tablet by mouth 2 times daily as needed (nausea and vomiting). May take 30 - 60 minutes prior to Temodar administration if nausea/vomiting occurs. 30 tablet 1   potassium chloride 20 MEQ TBCR Take 20 mEq by mouth daily. 90 tablet 1   temozolomide (TEMODAR) 140 MG capsule TAKE 1 CAPSULE (140 MG TOTAL) BY  MOUTH DAILY. MAY TAKE ON AN EMPTY STOMACH TO DECREASE NAUSEA & VOMITING. 12 capsule 0   No current facility-administered medications on file prior to visit.    Allergies: No Known Allergies Past Medical History:  Past Medical History:  Diagnosis Date   Bell's palsy    left side of face- notied in smile and eyes, if very tired   Brain cancer ([place])    Complication of anesthesia    N/V   Essential hypertension    External hemorrhoids    History of blood transfusion    post op Hemorroids   History of pneumonia    Hyperlipidemia    Hyperthyroidism    hx 55 years old- oral meds   Iron deficiency anemia    Migraine headache    Pneumonia    x 2 - years ago   PONV (postoperative nausea and vomiting)    PPD positive    Pulmonary nodules    not seen on plain cxr, first detected 1/07 re ct 10/08 pos IPPD > 15 mm 12/09 FOB/lavage 04/20/08 neg afb smear   PVC's (premature ventricular contractions)    Seizure ([place]) 11/15/2020   Sleep apnea    refused CPAP- mouth piece recommended   Tuberculosis    in her late 30's   Past Surgical History:  Past Surgical History:  Procedure Laterality Date   ABDOMINAL HYSTERECTOMY     APPLICATION OF CRANIAL NAVIGATION N/A 11/21/2020   Procedure: APPLICATION OF CRANIAL NAVIGATION;  Surgeon: Judith Part, MD;  Location: [facility];  Service: Neurosurgery;  Laterality: N/A;   BREAST BIOPSY Right    CHOLECYSTECTOMY  2014   COLONOSCOPY  05/04/2009   prolapsing external hemorrhoids   CRANIOTOMY Left 11/21/2020   Procedure: Left craniotomy for tumor resection;  Surgeon: Judith Part, MD;  Location: [facility];  Service: Neurosurgery;  Laterality: Left;   ENDOMETRIAL ABLATION     HEMORRHOID SURGERY     x 3   TUBAL LIGATION     Social History:  Social History   Socioeconomic History   Marital status: Divorced    Spouse name: Not on file   Number of children: 2   Years of education: Not on file   Highest education level: Not on file   Occupational History   Occupation: accounts Sport and exercise psychologist: [employer]  Tobacco Use   Smoking status: Never   Smokeless tobacco: Never  Vaping Use   Vaping Use: Never used  Substance and Sexual Activity   Alcohol use: No    Alcohol/week: 0.0 standard drinks   Drug use: No   Sexual activity: Not on file  Other Topics Concern   Not on file  Social History Narrative   Divorced with children   She works in Press photographer    Possible TB exposure 2008 (after nodules found)   From [place] lived there and [place]   Never owned birds      Social Determinants of Radio broadcast assistant Strain: Not on file  Food Insecurity: Not on file  Transportation Needs: Not on file  Physical  Activity: Not on file  Stress: Not on file  Social Connections: Not on file  Intimate Partner Violence: Not on file   Family History:  Family History  Problem Relation Age of Onset   Heart disease Maternal Grandmother        aunts, uncles, sister   Diabetes Mellitus II Maternal Grandmother    Kidney failure Sister    Diabetes Mellitus II Sister    Lung cancer Other        aunts and uncles   Colon cancer Maternal Aunt        aunts and uncles   Diabetes Mellitus II Maternal Aunt    Colon cancer Maternal Uncle    Diabetes Mellitus II Maternal Uncle    Diabetes type II Mother     Review of Systems: Constitutional: Doesn't report fevers, chills or abnormal weight loss Eyes: Doesn't report blurriness of vision Ears, nose, mouth, throat, and face: Doesn't report sore throat Respiratory: Doesn't report cough, dyspnea or wheezes Cardiovascular: Doesn't report palpitation, chest discomfort  Gastrointestinal:  Doesn't report nausea, constipation, diarrhea GU: Doesn't report incontinence Skin: Doesn't report skin rashes Neurological: Per HPI Musculoskeletal: Doesn't report joint pain Behavioral/Psych: Doesn't report anxiety  Physical Exam: Vitals with BMI 02/05/2021 01/25/2021 01/11/2021  Height - _0  -   Weight 178 lbs 8 oz 176 lbs 8 oz 176 lbs 5 oz  BMI 24.40 10.27 -  Systolic [phone]  Diastolic 74 82 81  Pulse 60 87 92    KPS: 90. General: Alert, cooperative, pleasant, in no acute distress Head: Normal EENT: No conjunctival injection or scleral icterus.  Lungs: Resp effort normal Cardiac: Regular rate Abdomen: Non-distended abdomen Skin: No rashes cyanosis or petechiae. Extremities: No clubbing or edema  Neurologic Exam: Mental Status: Awake, alert, attentive to examiner. Oriented to self and environment. Language is fluent with intact comprehension.  Cranial Nerves: Visual acuity is grossly normal. Visual fields are full. Extra-ocular movements intact. No ptosis. Face L LMN paresis, chronic. Motor: Tone and bulk are normal. Power is full in both arms and legs. Reflexes are symmetric, no pathologic reflexes present.  Sensory: Intact to light touch Gait: Normal.   Labs: I have reviewed the data as listed    Component Value Date/Time   NA 141 02/05/2021 0851   K 3.0 (L) 02/05/2021 0851   CL 103 02/05/2021 0851   CO2 26 02/05/2021 0851   GLUCOSE 136 (H) 02/05/2021 0851   BUN 19 02/05/2021 0851   CREATININE 1.09 (H) 02/05/2021 0851   CREATININE 0.92 01/25/2020 1424   CALCIUM 9.4 02/05/2021 0851   PROT 6.4 (L) 02/05/2021 0851   ALBUMIN 3.8 02/05/2021 0851   AST 10 (L) 02/05/2021 0851   ALT 16 02/05/2021 0851   ALKPHOS 96 02/05/2021 0851   BILITOT 0.8 02/05/2021 0851   GFRNONAA >60 02/05/2021 0851   GFRNONAA 72 01/25/2020 1424   GFRAA 83 01/25/2020 1424   Lab Results  Component Value Date   WBC 8.5 02/05/2021   NEUTROABS 6.3 02/05/2021   HGB 12.3 02/05/2021   HCT 36.9 02/05/2021   MCV 87.6 02/05/2021   PLT 241 02/05/2021    Assessment/Plan 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[place]) [C71.9]  [name] presents today with additional breakthrough seizures.  She is now in final week of IMRT and Temozolomide.   For seizures, we  recommended addition of Vimpat.  She will take loading dose of 177m BID x1 day, then take 511mBID starting with day  2.  Keppra will continue at 1518m BID for now.  Decadron will also remain at 468mdaily despite lack of efficacy, given likelihood of inflammation driving events.   Can also continue rescue ativan, 28m47mO PRN seizure aura  We ultimately recommend continuing with course of intensity modulated radiation therapy and concurrent daily Temozolomide.  Radiation will be administered Mon-Fri over 6 weeks, Temodar will be dosed at 78m29m to be given daily over 42 days.  We reviewed side effects of temodar, including fatigue, nausea/vomiting, constipation, and cytopenias.  Chemotherapy should be held for the following:  ANC less than 1,000  Platelets less than 100,000  LFT or creatinine greater than 2x ULN  If clinical concerns/contraindications develop  We will touch base with her in 1 week via phone to continue titration of AEDs, steroids.  All questions were answered. The patient knows to call the clinic with any problems, questions or concerns. No barriers to learning were detected.  The total time spent in the encounter was 30 minutes and more than 50% was on counseling and review of test results   ZachVentura Sellers Medical Director of Neuro-Oncology [facility] CenterWeslClear Lake03/22 10:20 AM

## 2021-02-06 ENCOUNTER — Ambulatory Visit
Admission: RE | Admit: 2021-02-06 | Discharge: 2021-02-06 | Disposition: A | Discharge status: Home / Self Care | Payer: BC Managed Care – PPO | Source: Ambulatory Visit | Attending: Radiation Oncology | Admitting: Radiation Oncology

## 2021-02-06 DIAGNOSIS — C712 Malignant neoplasm of temporal lobe: Secondary | ICD-10-CM | POA: Diagnosis not present

## 2021-02-06 DIAGNOSIS — Z510 Encounter for antineoplastic radiation therapy: Secondary | ICD-10-CM | POA: Diagnosis not present

## 2021-02-07 ENCOUNTER — Telehealth: Payer: Self-pay | Admitting: Internal Medicine

## 2021-02-07 ENCOUNTER — Other Ambulatory Visit: Payer: Self-pay

## 2021-02-07 ENCOUNTER — Ambulatory Visit
Admission: RE | Admit: 2021-02-07 | Discharge: 2021-02-07 | Disposition: A | Discharge status: Home / Self Care | Payer: BC Managed Care – PPO | Source: Ambulatory Visit | Attending: Radiation Oncology | Admitting: Radiation Oncology

## 2021-02-07 ENCOUNTER — Encounter: Payer: Self-pay | Admitting: Radiation Oncology

## 2021-02-07 DIAGNOSIS — Z510 Encounter for antineoplastic radiation therapy: Secondary | ICD-10-CM | POA: Diagnosis not present

## 2021-02-07 DIAGNOSIS — C712 Malignant neoplasm of temporal lobe: Secondary | ICD-10-CM | POA: Diagnosis not present

## 2021-02-07 NOTE — Telephone Encounter (Signed)
Scheduled appt per 10/3 los. Pt is aware of appts dates and times.

## 2021-02-08 ENCOUNTER — Encounter: Payer: Self-pay | Admitting: Internal Medicine

## 2021-02-08 ENCOUNTER — Inpatient Hospital Stay: Payer: BC Managed Care – PPO | Admitting: Internal Medicine

## 2021-02-08 ENCOUNTER — Inpatient Hospital Stay: Payer: BC Managed Care – PPO

## 2021-02-15 ENCOUNTER — Inpatient Hospital Stay (HOSPITAL_BASED_OUTPATIENT_CLINIC_OR_DEPARTMENT_OTHER): Payer: BC Managed Care – PPO | Admitting: Internal Medicine

## 2021-02-15 DIAGNOSIS — C719 Malignant neoplasm of brain, unspecified: Secondary | ICD-10-CM | POA: Diagnosis not present

## 2021-02-15 DIAGNOSIS — C712 Malignant neoplasm of temporal lobe: Secondary | ICD-10-CM

## 2021-02-15 DIAGNOSIS — R569 Unspecified convulsions: Secondary | ICD-10-CM

## 2021-02-15 MED ORDER — DEXAMETHASONE 2 MG PO TABS: ORAL_TABLET | ORAL | 1 refills | Status: DC

## 2021-02-15 NOTE — Progress Notes (Signed)
I connected with [name] on 02/15/21 at 10:30 AM EDT by telephone visit and verified that I am speaking with the correct person using two identifiers.   I discussed the limitations, risks, security and privacy concerns of performing an evaluation and management service by telemedicine and the availability of in-person appointments. I also discussed with the patient that there may be a patient responsible charge related to this service. The patient expressed understanding and agreed to proceed.   Other persons participating in the visit and their role in the encounter:  n/a  Patient's location:  Home  Provider's location:  Office  Chief Complaint:  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[name])  Cancer of temporal lobe ([name]) - Plan: dexamethasone (DECADRON) 2 MG tablet  Focal seizures ([name])  History of Present Ilness: [name] describes resolution of seizure episodes since starting the Vimpat.  No side effects from the medicine; she does note hot flashes and night sweats that had started before this recent seizure cluster.  Continues to experience anxiety, particularly around food/diet.  Otherwise no new or progressive neurologic deficits.  Observations: Language and cognition at baseline  Assessment and Plan: 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)  Cancer of temporal lobe ([name]) - Plan: dexamethasone (DECADRON) 2 MG tablet  Focal seizures ([name])  Recommend continuing Vimpat 50mg  BID in addition to the Keppra 1500mg  BID.  Decadron should be decreased to 2mg  daily if tolerated.    Introduced mindfulness as a Risk analyst of stress, anxiety, food cravings.  Has MRI due for 03/02/21  Follow Up Instructions: RTC after upcoming MRI later this month  I discussed the assessment and treatment plan with the patient.  The patient was provided an opportunity to ask questions and all were answered.  The patient agreed with the plan and  demonstrated understanding of the instructions.    The patient was advised to call back or seek an in-person evaluation if the symptoms worsen or if the condition fails to improve as anticipated.  I provided 5-10 minutes of non-face-to-face time during this enocunter.  Ventura Sellers, MD   I provided 22 minutes of non face-to-face telephone visit time during this encounter, and > 50% was spent counseling as documented under my assessment & plan.

## 2021-02-27 ENCOUNTER — Other Ambulatory Visit: Payer: Self-pay | Admitting: Radiation Therapy

## 2021-03-02 ENCOUNTER — Ambulatory Visit (HOSPITAL_COMMUNITY)
Admission: RE | Admit: 2021-03-02 | Discharge: 2021-03-02 | Disposition: A | Discharge status: Home / Self Care | Payer: BC Managed Care – PPO | Source: Ambulatory Visit | Attending: Internal Medicine | Admitting: Internal Medicine

## 2021-03-02 DIAGNOSIS — C719 Malignant neoplasm of brain, unspecified: Secondary | ICD-10-CM | POA: Diagnosis not present

## 2021-03-02 DIAGNOSIS — Z9889 Other specified postprocedural states: Secondary | ICD-10-CM | POA: Diagnosis not present

## 2021-03-02 MED ORDER — GADOBUTROL 1 MMOL/ML IV SOLN
8.0000 mL | Freq: Once | INTRAVENOUS | Status: AC | PRN
  Administered 2021-03-02: 8 mL via INTRAVENOUS

## 2021-03-05 ENCOUNTER — Inpatient Hospital Stay (HOSPITAL_BASED_OUTPATIENT_CLINIC_OR_DEPARTMENT_OTHER): Payer: BC Managed Care – PPO | Admitting: Internal Medicine

## 2021-03-05 ENCOUNTER — Telehealth: Payer: Self-pay | Admitting: Pharmacist

## 2021-03-05 ENCOUNTER — Other Ambulatory Visit (HOSPITAL_COMMUNITY): Payer: Self-pay

## 2021-03-05 ENCOUNTER — Inpatient Hospital Stay: Payer: BC Managed Care – PPO

## 2021-03-05 ENCOUNTER — Other Ambulatory Visit: Payer: Self-pay

## 2021-03-05 VITALS — BP 132/91 | HR 102 | Temp 98.3°F | Resp 17 | Wt 190.2 lb

## 2021-03-05 DIAGNOSIS — C719 Malignant neoplasm of brain, unspecified: Secondary | ICD-10-CM

## 2021-03-05 DIAGNOSIS — R569 Unspecified convulsions: Secondary | ICD-10-CM | POA: Diagnosis not present

## 2021-03-05 DIAGNOSIS — Z510 Encounter for antineoplastic radiation therapy: Secondary | ICD-10-CM | POA: Diagnosis not present

## 2021-03-05 DIAGNOSIS — C712 Malignant neoplasm of temporal lobe: Secondary | ICD-10-CM | POA: Diagnosis not present

## 2021-03-05 LAB — CBC WITH DIFFERENTIAL (CANCER CENTER ONLY)
Abs Immature Granulocytes: 0.35 10*3/uL — ABNORMAL HIGH (ref 0.00–0.07)
Basophils Absolute: 0.1 10*3/uL (ref 0.0–0.1)
Basophils Relative: 2 %
Eosinophils Absolute: 0.1 10*3/uL (ref 0.0–0.5)
Eosinophils Relative: 1 %
HCT: 38.8 % (ref 36.0–46.0)
Hemoglobin: 13.3 g/dL (ref 12.0–15.0)
Immature Granulocytes: 8 %
Lymphocytes Relative: 27 %
Lymphs Abs: 1.2 10*3/uL (ref 0.7–4.0)
MCH: 29.1 pg (ref 26.0–34.0)
MCHC: 34.3 g/dL (ref 30.0–36.0)
MCV: 84.9 fL (ref 80.0–100.0)
Monocytes Absolute: 0.5 10*3/uL (ref 0.1–1.0)
Monocytes Relative: 10 %
Neutro Abs: 2.3 10*3/uL (ref 1.7–7.7)
Neutrophils Relative %: 52 %
Platelet Count: 157 10*3/uL (ref 150–400)
RBC: 4.57 MIL/uL (ref 3.87–5.11)
RDW: 13.6 % (ref 11.5–15.5)
Smear Review: NORMAL
WBC Count: 4.4 10*3/uL (ref 4.0–10.5)
nRBC: 0 % (ref 0.0–0.2)

## 2021-03-05 LAB — CMP (CANCER CENTER ONLY)
ALT: 29 U/L (ref 0–44)
AST: 12 U/L — ABNORMAL LOW (ref 15–41)
Albumin: 3.9 g/dL (ref 3.5–5.0)
Alkaline Phosphatase: 126 U/L (ref 38–126)
Anion gap: 10 (ref 5–15)
BUN: 19 mg/dL (ref 6–20)
CO2: 28 mmol/L (ref 22–32)
Calcium: 9.9 mg/dL (ref 8.9–10.3)
Chloride: 102 mmol/L (ref 98–111)
Creatinine: 0.99 mg/dL (ref 0.44–1.00)
GFR, Estimated: >60 mL/min (ref >60)
Glucose, Bld: 161 mg/dL — ABNORMAL HIGH (ref 70–99)
Potassium: 3.4 mmol/L — ABNORMAL LOW (ref 3.5–5.1)
Sodium: 140 mmol/L (ref 135–145)
Total Bilirubin: 1 mg/dL (ref 0.3–1.2)
Total Protein: 7.4 g/dL (ref 6.5–8.1)

## 2021-03-05 MED ORDER — TEMOZOLOMIDE 100 MG PO CAPS
150.0000 mg/m2/d | ORAL_CAPSULE | Freq: Every day | ORAL | 0 refills | Status: DC
  Filled 2021-03-05: qty 15, 5d supply, fill #0

## 2021-03-05 MED ORDER — ONDANSETRON HCL 8 MG PO TABS
8.0000 mg | ORAL_TABLET | Freq: Two times a day (BID) | ORAL | 1 refills | Status: DC | PRN
  Filled 2021-03-05: qty 18, 21d supply, fill #0

## 2021-03-05 MED ORDER — TEMOZOLOMIDE 100 MG PO CAPS: 150.0000 mg/m2/d | ORAL_CAPSULE | Freq: Every day | ORAL | 0 refills | Status: DC

## 2021-03-05 NOTE — Progress Notes (Signed)
DISCONTINUE ON PATHWAY REGIMEN - Neuro     One cycle, concurrent with RT:     Temozolomide   **Always confirm dose/schedule in your pharmacy ordering system**  REASON: Continuation Of Treatment PRIOR TREATMENT: [id number]: Radiation Therapy with Concurrent Temozolomide 75 mg/m2 Daily x 6 Weeks, Followed by Adjuvant Temozolomide TREATMENT RESPONSE: Stable Disease ([facility])  START ON PATHWAY REGIMEN - Neuro     A cycle is every 28 days:     Temozolomide      Temozolomide   **Always confirm dose/schedule in your pharmacy ordering system**  Patient Characteristics: Glioblastoma (Grade 4 Glioma), Newly Diagnosed / Treatment Naive, Good Performance Status and/or Younger Patient, MGMT Promoter Unmethylated/Unknown Disease Classification: Glioma Disease Classification: Glioblastoma (Grade 4 Glioma) Disease Status: Newly Diagnosed / Treatment Naive Performance Status: Good Performance Status and/or Younger Patient MGMT Promoter Methylation Status: Awaiting Test Results Intent of Therapy: Non-Curative / Palliative Intent, Discussed with Patient

## 2021-03-05 NOTE — Progress Notes (Signed)
[facility] [place], [place] [address] [phone]   Interval Evaluation  Date of Service: 03/05/21 Patient Name: [name] Patient MRN: [id number] Patient DOB: 1966/02/04 Provider: Ventura Sellers, MD  Identifying Statement:  [name] is a 55 y.o. female with left temporal glioblastoma   Oncologic History: Oncology History  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[place])  11/21/2020 Initial Diagnosis   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[place])   11/21/2020 Cancer Staging   Staging form: Brain and Spinal Cord, AJCC 8th Edition - Pathologic stage from 11/21/2020: WHO Grade IV - Signed by Ventura Sellers, MD on 11/30/2020 Histopathologic type: Glioblastoma Stage prefix: Initial diagnosis Histologic grading system: 4 grade system Extent of surgical resection: Subtotal resection Karnofsky performance status: Score 90 Seizures at presentation: Present Duration of symptoms before diagnosis: Short IDH1 mutation: Negative    12/27/2020 -  Chemotherapy    Patient is on Treatment Plan: BRAIN GLIOBLASTOMA RADIATION THERAPY WITH CONCURRENT TEMOZOLOMIDE 75 MG/M2 DAILY FOLLOWED BY SEQUENTIAL MAINTENANCE TEMOZOLOMIDE X 6-12 CYCLES         Biomarkers:  MGMT Methylated.  IDH 1/2 Wild type.  EGFR Unknown  TERT Unknown   Interval History: [name] presents today for follow up, now having completed IMRT and Temodar.  Only one mild breakthrough seizure since our prior visit a month ago.  She does complain of swelling in her face, abdomen with the steroids.  Short term memory impairment and fatigue are more persistent and noticeable.  Decadron is at 55m daily.  Continues to work part time 20-30 hours per week.  H+P (11/25/20) Patient presented to medical attention this past month with first ever seizure.  She describes episode of sudden onset "disconnection" with tongue biting  and urination, followed by period of confusion.  CNS imaging demonstrated enhancing mass within left anterior temporal lobe, which was mostly resected by Dr. OZada Finderson 11/21/20.  Since surgery she had not had recurrence of seizures.  She complains of painful cramping of her right leg, which started ~1 week prior.  At this time the cramping is her main complaint.  Otherwise fully functional, independent, would like to return to work if possible.  Medications: Current Outpatient Medications on File Prior to Visit  Medication Sig Dispense Refill   atorvastatin (LIPITOR) 20 MG tablet Take 1 tablet (20 mg total) by mouth daily. 90 tablet 1   [facility] D3 125 MCG (5000 UT) capsule TAKE 1 CAPSULE (5,000 UNITS TOTAL) BY MOUTH DAILY. 90 capsule 3   dexamethasone (DECADRON) 2 MG tablet Take 1 tablet in the AM 60 tablet 1   hydrochlorothiazide (HYDRODIURIL) 25 MG tablet TAKE 1 TABLET (25 MG TOTAL) BY MOUTH DAILY. 90 tablet 0   lacosamide (VIMPAT) 50 MG TABS tablet Take 1 tablet (50 mg total) by mouth 2 (two) times daily. 60 tablet 3   levETIRAcetam (KEPPRA) 750 MG tablet Take 2 tablets (1,500 mg total) by mouth 2 (two) times daily. 120 tablet 3   levothyroxine (SYNTHROID) 100 MCG tablet TAKE 1 TABLET BY MOUTH EVERY DAY 90 tablet 3   LORazepam (ATIVAN) 2 MG tablet Take 1 tablet (2 mg total) by mouth every 8 (eight) hours as needed for seizure. 12 tablet 0   losartan (COZAAR) 100 MG tablet TAKE 1 TABLET BY MOUTH EVERY DAY 90 tablet 0   omeprazole (PRILOSEC) 20 MG capsule Take 1 capsule (20 mg total) by mouth daily. Take while on  Dexamethasone. 30 capsule 1   ondansetron (ZOFRAN) 8 MG tablet Take 1 tablet by mouth 2 times daily as needed (nausea and vomiting). May take 30 - 60 minutes prior to Temodar administration if nausea/vomiting occurs. 30 tablet 1   potassium chloride 20 MEQ TBCR Take 20 mEq by mouth daily. 90 tablet 1   temozolomide (TEMODAR) 140 MG capsule TAKE 1 CAPSULE (140 MG TOTAL) BY MOUTH DAILY. MAY  TAKE ON AN EMPTY STOMACH TO DECREASE NAUSEA & VOMITING. 12 capsule 0   No current facility-administered medications on file prior to visit.    Allergies: No Known Allergies Past Medical History:  Past Medical History:  Diagnosis Date   Bell's palsy    left side of face- notied in smile and eyes, if very tired   Brain cancer ([facility])    Complication of anesthesia    N/V   Essential hypertension    External hemorrhoids    History of blood transfusion    post op Hemorroids   History of pneumonia    Hyperlipidemia    Hyperthyroidism    hx 55 years old- oral meds   Iron deficiency anemia    Migraine headache    Pneumonia    x 2 - years ago   PONV (postoperative nausea and vomiting)    PPD positive    Pulmonary nodules    not seen on plain cxr, first detected 1/07 re ct 10/08 pos IPPD > 15 mm 12/09 FOB/lavage 04/20/08 neg afb smear   PVC's (premature ventricular contractions)    Seizure ([place]) 11/15/2020   Sleep apnea    refused CPAP- mouth piece recommended   Tuberculosis    in her late 30's   Past Surgical History:  Past Surgical History:  Procedure Laterality Date   ABDOMINAL HYSTERECTOMY     APPLICATION OF CRANIAL NAVIGATION N/A 11/21/2020   Procedure: APPLICATION OF CRANIAL NAVIGATION;  Surgeon: Judith Part, MD;  Location: [facility];  Service: Neurosurgery;  Laterality: N/A;   BREAST BIOPSY Right    CHOLECYSTECTOMY  2014   COLONOSCOPY  05/04/2009   prolapsing external hemorrhoids   CRANIOTOMY Left 11/21/2020   Procedure: Left craniotomy for tumor resection;  Surgeon: Judith Part, MD;  Location: [facility];  Service: Neurosurgery;  Laterality: Left;   ENDOMETRIAL ABLATION     HEMORRHOID SURGERY     x 3   TUBAL LIGATION     Social History:  Social History   Socioeconomic History   Marital status: Divorced    Spouse name: Not on file   Number of children: 2   Years of education: Not on file   Highest education level: Not on file  Occupational History    Occupation: accounts Sport and exercise psychologist: [employer]  Tobacco Use   Smoking status: Never   Smokeless tobacco: Never  Vaping Use   Vaping Use: Never used  Substance and Sexual Activity   Alcohol use: No    Alcohol/week: 0.0 standard drinks   Drug use: No   Sexual activity: Not on file  Other Topics Concern   Not on file  Social History Narrative   Divorced with children   She works in Press photographer    Possible TB exposure 2008 (after nodules found)   From [place] lived there and [place]   Never owned birds      Social Determinants of Radio broadcast assistant Strain: Not on file  Food Insecurity: Not on file  Transportation Needs: Not on  file  Physical Activity: Not on file  Stress: Not on file  Social Connections: Not on file  Intimate Partner Violence: Not on file   Family History:  Family History  Problem Relation Age of Onset   Heart disease Maternal Grandmother        aunts, uncles, sister   Diabetes Mellitus II Maternal Grandmother    Kidney failure Sister    Diabetes Mellitus II Sister    Lung cancer Other        aunts and uncles   Colon cancer Maternal Aunt        aunts and uncles   Diabetes Mellitus II Maternal Aunt    Colon cancer Maternal Uncle    Diabetes Mellitus II Maternal Uncle    Diabetes type II Mother     Review of Systems: Constitutional: Doesn't report fevers, chills or abnormal weight loss Eyes: Doesn't report blurriness of vision Ears, nose, mouth, throat, and face: Doesn't report sore throat Respiratory: Doesn't report cough, dyspnea or wheezes Cardiovascular: Doesn't report palpitation, chest discomfort  Gastrointestinal:  Doesn't report nausea, constipation, diarrhea GU: Doesn't report incontinence Skin: Doesn't report skin rashes Neurological: Per HPI Musculoskeletal: Doesn't report joint pain Behavioral/Psych: Doesn't report anxiety  Physical Exam: Vitals with BMI 03/05/2021 02/05/2021 01/25/2021  Height - - '5\' 8"'   Weight 190 lbs 4 oz 178  lbs 8 oz 176 lbs 8 oz  BMI - 42.87 68.11  Systolic 572 620 355  Diastolic 91 74 82  Pulse 974 60 87    KPS: 90. General: Alert, cooperative, pleasant, in no acute distress Head: Normal EENT: No conjunctival injection or scleral icterus.  Lungs: Resp effort normal Cardiac: Regular rate Abdomen: Non-distended abdomen Skin: No rashes cyanosis or petechiae. Extremities: No clubbing or edema  Neurologic Exam: Mental Status: Awake, alert, attentive to examiner. Oriented to self and environment. Language is fluent with intact comprehension.  Cranial Nerves: Visual acuity is grossly normal. Visual fields are full. Extra-ocular movements intact. No ptosis. Face L LMN paresis, chronic. Motor: Tone and bulk are normal. Power is full in both arms and legs. Reflexes are symmetric, no pathologic reflexes present.  Sensory: Intact to light touch Gait: Normal.   Labs: I have reviewed the data as listed    Component Value Date/Time   NA 141 02/05/2021 0851   K 3.0 (L) 02/05/2021 0851   CL 103 02/05/2021 0851   CO2 26 02/05/2021 0851   GLUCOSE 136 (H) 02/05/2021 0851   BUN 19 02/05/2021 0851   CREATININE 1.09 (H) 02/05/2021 0851   CREATININE 0.92 01/25/2020 1424   CALCIUM 9.4 02/05/2021 0851   PROT 6.4 (L) 02/05/2021 0851   ALBUMIN 3.8 02/05/2021 0851   AST 10 (L) 02/05/2021 0851   ALT 16 02/05/2021 0851   ALKPHOS 96 02/05/2021 0851   BILITOT 0.8 02/05/2021 0851   GFRNONAA >60 02/05/2021 0851   GFRNONAA 72 01/25/2020 1424   GFRAA 83 01/25/2020 1424   Lab Results  Component Value Date   WBC 8.5 02/05/2021   NEUTROABS 6.3 02/05/2021   HGB 12.3 02/05/2021   HCT 36.9 02/05/2021   MCV 87.6 02/05/2021   PLT 241 02/05/2021   Imaging:  [facility] Clinician Interpretation: I have personally reviewed the CNS images as listed.  My interpretation, in the context of the patient's clinical presentation, is treatment effect vs true progression  MR BRAIN W WO CONTRAST  Result Date:  03/02/2021 CLINICAL DATA:  Brain/CNS neoplasm, assess treatment response; glioblastoma follow-up EXAM: MRI HEAD WITHOUT  AND WITH CONTRAST TECHNIQUE: Multiplanar, multiecho pulse sequences of the brain and surrounding structures were obtained without and with intravenous contrast. CONTRAST:  101m GADAVIST GADOBUTROL 1 MMOL/ML IV SOLN COMPARISON:  11/22/2020 FINDINGS: Brain: Postoperative changes in the left temporal lobe have expectedly evolved. Thin underlying extra-axial collection remains. Area of enhancement at the superior margin of the cavity has resolved. There are now more well-defined foci of enhancement deep to the posterior insula (series 16, image 72). Together the cluster measures approximately 1.2 cm. T2 FLAIR hyperintensity is present in this region. However, overall T2 FLAIR hyperintensity has significantly decreased. There is no acute infarction or intracranial hemorrhage. No significant mass effect. No hydrocephalus. Vascular: Major vessel flow voids at the skull base are preserved. Skull and upper cervical spine: Normal marrow signal is preserved. Left pterional craniotomy. Sinuses/Orbits: Paranasal sinuses are aerated. Orbits are unremarkable. Other: Sella is unremarkable.  Mastoid air cells are clear. IMPRESSION: Resolution of area of enhancement superior to the resection site. Significant decrease in abnormal T2 FLAIR hyperintensity. More well-defined cluster of enhancement deep to the posterior left insula with persistent T2 FLAIR hyperintensity in this area. Electronically Signed   By: PMacy MisM.D.   On: 03/02/2021 10:44     Assessment/Plan 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[facility] [C71.9]  [name] MHIAWATHA DRESSELis clinically stable today, now having completed IMRT+TMZ, with findings consistent with general treatment related brain dysfunction (fatigue, memory) and iatrogenic effects from steroids.  MRI brain demonstrates overall improved picture, with clustered  localized enhancing foci and decreased T2 signal volume.  Enhancing disease may represent treatment related effects as well.  We recommended initiating treatment with cycle #1 Temozolomide 150 mg/m2, on for five days and off for twenty three days in twenty eight day cycles. The patient will have a complete blood count performed on days 21 and 28 of each cycle, and a comprehensive metabolic panel performed on day 28 of each cycle. Labs may need to be performed more often. Zofran will prescribed for home use for nausea/vomiting.   Informed consent was obtained verbally at bedside to proceed with oral chemotherapy.  Chemotherapy should be held for the following:  ANC less than 1,000  Platelets less than 100,000  LFT or creatinine greater than 2x ULN  If clinical concerns/contraindications develop  For seizures, will con't Vimpat 545mBID and Keppra 150066mID.  Can also continue rescue ativan, 2mg73m PRN seizure aura  Decadron will discontinue if tolerated, otherwise can resume at 1mg 17mly as discussed.  We ask that TijuaRAEDYN KLINCKrn to clinic in 1 months prior to cycle #2, or sooner as needed.  All questions were answered. The patient knows to call the clinic with any problems, questions or concerns. No barriers to learning were detected.  The total time spent in the encounter was 30 minutes and more than 50% was on counseling and review of test results   ZachaVentura SellersMedical Director of Neuro-Oncology [facility]le[facility]arpenter1/22 10:02 AM

## 2021-03-05 NOTE — Telephone Encounter (Signed)
Oral Oncology Pharmacist Encounter  Received new prescription for Temodar (temozolomide) for the maintenance treatment of glioblastoma, planned duration ~6-12 cycles.  CBC w/ Diff and CMP from 03/05/21 assessed, no relevant lab abnormalities noted. Prescription dose and frequency assessed for appropriateness.  Current medication list in Epic reviewed, no relevant/significant DDIs with Temodar identified.  Evaluated chart and no patient barriers to medication adherence noted.   Patient agreement for treatment documented in MD note on 03/05/21.  Patient's insurance requires Temodar to be filled through [facility]. Prescription redirected for dispensing. Prior authorization already on file for patient and good through 12/19/21.  Oral Oncology Clinic will continue to follow for copayment issues, initial counseling and start date.  Leron Croak, PharmD, BCPS Hematology/Oncology Clinical Pharmacist [name] [phone] 03/05/2021 4:01 PM

## 2021-03-06 ENCOUNTER — Other Ambulatory Visit (HOSPITAL_COMMUNITY): Payer: Self-pay

## 2021-03-07 ENCOUNTER — Other Ambulatory Visit: Payer: Self-pay | Admitting: Internal Medicine

## 2021-03-07 DIAGNOSIS — C719 Malignant neoplasm of brain, unspecified: Secondary | ICD-10-CM

## 2021-03-07 MED ORDER — ONDANSETRON HCL 8 MG PO TABS: 8.0000 mg | ORAL_TABLET | Freq: Two times a day (BID) | ORAL | 1 refills | Status: DC | PRN

## 2021-03-07 NOTE — Telephone Encounter (Signed)
Refilled today by Dr. Mickeal Skinner. Gardiner Rhyme, RN

## 2021-03-07 NOTE — Telephone Encounter (Signed)
Oral Chemotherapy Pharmacist Encounter  I spoke with patient for overview of: Temodar (temozolomide) for the maintenance treatment of glioblastoma multiforme, planned duration 6-12 months of treatment.  Counseled on administration, dosing, side effects, monitoring, drug-food interactions, safe handling, storage, and disposal.  Patient will take Temodar 100mg  capsules, 3 capsules (300mg  total daily dose), by mouth once daily, may take at bedtime and on an empty stomach to decrease nausea and vomiting.  If 1st cycle is well tolerated, patient informed that Temodar dose may be increased to 200 mg/m2 daily for 5 days on, 23 days off, repeated every 28 days for subsequent cycles   Patient will take Temodar daily for 5 days on, 23 days off, and repeated.  Temodar start date: 03/13/21   Patient will take Zofran 8mg  tablet, 1 tablet by mouth 30-60 min prior to Temodar dose to help decrease N/V. Patient request that the [facility]. Prescription redirected.   Adverse effects include but are not limited to: nausea, vomiting, anorexia, GI upset, rash, drug fever, and fatigue. Rare but serious adverse effects of pneumocystis pneumonia and secondary malignancy also discussed.  We discussed strategies to manage constipation if they occur secondary to ondansetron dosing.  PCP prophylaxis will not be initiated at this time, but may be added based on lymphocyte count in the future.  Reviewed importance of keeping a medication schedule and plan for any missed doses. No barriers to medication adherence identified.  Medication reconciliation performed and medication/allergy list updated.  Insurance authorization for Temodar has been obtained. Patient's insurance requires Temodar to be filled through [facility]. Patient has already set up shipment of Temodar and [facility]. She will pick it up on 03/13/21  and start her first cycle that evening.   All questions answered.  Ms. Bartram voiced understanding and appreciation.   Medication education handout placed in mail for patient. Patient knows to call the office with questions or concerns. Oral Chemotherapy Clinic phone number provided to patient.   Leron Croak, PharmD, BCPS Hematology/Oncology Clinical Pharmacist [facility] [phone] 03/07/2021 9:12 AM

## 2021-03-08 ENCOUNTER — Other Ambulatory Visit: Payer: Self-pay | Admitting: *Deleted

## 2021-03-08 ENCOUNTER — Telehealth: Payer: Self-pay | Admitting: *Deleted

## 2021-03-08 NOTE — Telephone Encounter (Signed)
Patient states she discontinued Decadron this past Monday.  She reports decreased energy, decreased appetite and new dizziness.  She states that it is tolerable.  I explained these are all expected side effects of coming off of the Decadron.  The fatigue could also be the delayed cumulative effect of radiation. She states she will continue to tolerate but is aware to let us know if it becomes worse.   Advised that Dr Mickeal Skinner was ok with her going back on Decadron 1 mg if it is intolerable.

## 2021-03-10 ENCOUNTER — Other Ambulatory Visit (HOSPITAL_COMMUNITY): Payer: Self-pay

## 2021-03-13 ENCOUNTER — Ambulatory Visit
Admission: RE | Admit: 2021-03-13 | Discharge: 2021-03-13 | Disposition: A | Discharge status: Home / Self Care | Payer: BC Managed Care – PPO | Source: Ambulatory Visit | Attending: Radiation Oncology | Admitting: Radiation Oncology

## 2021-03-13 ENCOUNTER — Other Ambulatory Visit: Payer: Self-pay

## 2021-03-13 ENCOUNTER — Encounter: Payer: Self-pay | Admitting: Internal Medicine

## 2021-03-13 VITALS — BP 102/73 | HR 118 | Temp 97.0°F | Resp 18 | Ht 68.0 in | Wt 187.2 lb

## 2021-03-13 DIAGNOSIS — R42 Dizziness and giddiness: Secondary | ICD-10-CM | POA: Diagnosis not present

## 2021-03-13 DIAGNOSIS — R000 Tachycardia, unspecified: Secondary | ICD-10-CM | POA: Insufficient documentation

## 2021-03-13 DIAGNOSIS — R059 Cough, unspecified: Secondary | ICD-10-CM | POA: Diagnosis not present

## 2021-03-13 DIAGNOSIS — Z79899 Other long term (current) drug therapy: Secondary | ICD-10-CM | POA: Diagnosis not present

## 2021-03-13 DIAGNOSIS — Z7952 Long term (current) use of systemic steroids: Secondary | ICD-10-CM | POA: Insufficient documentation

## 2021-03-13 DIAGNOSIS — C712 Malignant neoplasm of temporal lobe: Secondary | ICD-10-CM | POA: Diagnosis not present

## 2021-03-13 DIAGNOSIS — C719 Malignant neoplasm of brain, unspecified: Secondary | ICD-10-CM

## 2021-03-13 DIAGNOSIS — R5383 Other fatigue: Secondary | ICD-10-CM | POA: Insufficient documentation

## 2021-03-13 NOTE — Progress Notes (Signed)
                                                                                                                                                             Patient Name: [name] MRN: [id number] DOB: 11-04-1965 Referring Physician: Cecil Cobbs Date of Service: 02/07/2021 [facility], [place]                                                        End Of Treatment Note  Diagnoses: C71.2-Malignant neoplasm of temporal lobe  Glioblastoma  Intent: Palliative  Radiation Treatment Dates: 12/27/2020 through 02/07/2021 Site Technique Total Dose (Gy) Dose per Fx (Gy) Completed Fx Beam Energies  Temporal Lobe: Brain_L_Tempo IMRT 46/46 2 23/23 6X  Temporal Lobe: Brain_Bst_L_tempo IMRT 14/14 2 7/7 6X   Narrative: The patient tolerated radiation therapy relatively well.  Towards the end of her treatment course she started to experience aura-like episodes for which she saw Dr. Mickeal Skinner to manage medically.  Plan: The patient will follow-up with radiation oncology in 46mo . -----------------------------------  Eppie Gibson, MD

## 2021-03-13 NOTE — Progress Notes (Signed)
Radiation Oncology         [phone] ________________________________  Name: [name] MRN: [id number]  Date: 03/13/2021  DOB: Mar 23, 1966  Follow-Up Visit Note  Outpatient  CC: [name] Peng, NP  [name] Sellers, MD  Diagnosis and Prior Radiotherapy:    ICD-10-CM   1. Cancer of temporal lobe ([place])  C71.2     2. 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)  C71.9       CHIEF COMPLAINT: Here for follow-up and surveillance of glioblastoma  Narrative:  The patient returns today for routine follow-up.  [name] presents today for follow-up after completing radiation to her brain on 02/07/2021 MRI of brain on 03/02/2021 revealed resolution of enhancement superior to the resection site and significant decrease in the T2 flair hyperintensity in that area.  Deep to the posterior left insula there was more well-defined cluster of enhancement and persistent T2 flair hyperintensity.  This will serve as a baseline posttreatment.  Dose of Decadron, if applicable: Currently not taking any dexamethasone (may resume 1 mg daily if needed per Dr. Renda Rolls nurse)   Recent neurologic symptoms, if any:  Seizures: Reports feeling the early symptoms one time since completing radiation/oral chemo. States she took her sublingual Ativan, laid down, and symptoms resolved (noted in Dr. Renda Rolls last office note on 03/05/21). Denies any other occurrences. Headaches: Denies any new or worsening episodes. Continues to deal with intermitent sharp pains to her left anterior/left posterior areas of her head. States discomfort will resolve on its own without taking any OTC pain medication  Nausea: Denies any nausea, but does report appetite has waned again since stopping dexamethasone; admits she mainly eats around her medication times to prevent stomach upset Wt Readings from Last 3 Encounters:  03/13/21 187 lb 4 oz (84.9 kg)  03/05/21 190 lb 4 oz (86.3 kg)  02/05/21 178 lb 8 oz (81  kg)   Dizziness/ataxia: Reports occasional dizziness with extreme fatigue when she's up moving around (will immediately go lay down, and reports system resolve) Difficulty with hand coordination: Patient denies Focal numbness/weakness: Patient denies  Visual deficits/changes: Denies any new or worsening symptoms. Reports continues occasional blurry vision, but states it resolves when she wears her glasses Confusion/Memory deficits: Patient denies any new issues or concerns  Additional Complaints / other details: Reports she'll restart her Temodar prescription tonight at an increased dose (300 mg). Has noticed a cramping/tingling sensation to her hands and feet/ankles + left side of face. States it often resolves after she massages areas for some time. Reports a persistent dry cough that often occurs in the evenings and mornings.                                ALLERGIES:  has No Known Allergies.  Meds: Current Outpatient Medications  Medication Sig Dispense Refill   atorvastatin (LIPITOR) 20 MG tablet Take 1 tablet (20 mg total) by mouth daily. 90 tablet 1   [employer] D3 125 MCG (5000 UT) capsule TAKE 1 CAPSULE (5,000 UNITS TOTAL) BY MOUTH DAILY. 90 capsule 3   dexamethasone (DECADRON) 2 MG tablet Take 1 tablet in the AM (Patient not taking: Reported on 03/13/2021) 60 tablet 1   hydrochlorothiazide (HYDRODIURIL) 25 MG tablet TAKE 1 TABLET (25 MG TOTAL) BY MOUTH DAILY. 90 tablet 0   lacosamide (VIMPAT) 50 MG TABS tablet Take 1 tablet (50 mg total) by mouth 2 (two) times daily. 60 tablet  3   levETIRAcetam (KEPPRA) 750 MG tablet Take 2 tablets (1,500 mg total) by mouth 2 (two) times daily. 120 tablet 3   levothyroxine (SYNTHROID) 100 MCG tablet TAKE 1 TABLET BY MOUTH EVERY DAY 90 tablet 3   LORazepam (ATIVAN) 2 MG tablet Take 1 tablet (2 mg total) by mouth every 8 (eight) hours as needed for seizure. 12 tablet 0   losartan (COZAAR) 100 MG tablet TAKE 1 TABLET BY MOUTH EVERY DAY 90 tablet 0   omeprazole  (PRILOSEC) 20 MG capsule Take 1 capsule (20 mg total) by mouth daily. Take while on Dexamethasone. 30 capsule 1   ondansetron (ZOFRAN) 8 MG tablet Take 1 tablet by mouth twice daily as needed for nausea & vomiting. May take 30-60 minutes prior to Temodar administration if nausea/vomiting occurs. 30 tablet 1   potassium chloride 20 MEQ TBCR Take 20 mEq by mouth daily. 90 tablet 1   temozolomide (TEMODAR) 100 MG capsule Take 3 capsules (300 mg total) by mouth daily. Take for 5 days on, 23 days off. Repeat every 28 days. May take on an empty stomach to decrease nausea & vomiting. 15 capsule 0   No current facility-administered medications for this encounter.    Physical Findings: The patient is in no acute distress. Patient is alert and oriented.  height is 5\' 8"  (1.727 m) and weight is 187 lb 4 oz (84.9 kg). Her temporal temperature is 97 F (36.1 C) (abnormal). Her blood pressure is 102/73 and her pulse is 118 (abnormal). Her respiration is 18 and oxygen saturation is 100%. Marland Kitchen    HEENT: No oral thrush, stable left Bell's palsy Heart is regular in rhythm with tachycardia Chest clear to auscultation bilaterally Neuro: Alert and oriented and ambulatory; speech is fluent Psychiatric: Bright affect, judgment and insight intact  KPS = 70  100 - Normal; no complaints; no evidence of disease. 90   - Able to carry on normal activity; minor signs or symptoms of disease. 80   - Normal activity with effort; some signs or symptoms of disease. 49   - Cares for self; unable to carry on normal activity or to do active work. 60   - Requires occasional assistance, but is able to care for most of his personal needs. 50   - Requires considerable assistance and frequent medical care. 47   - Disabled; requires special care and assistance. 76   - Severely disabled; hospital admission is indicated although death not imminent. 7   - Very sick; hospital admission necessary; active supportive treatment  necessary. 10   - Moribund; fatal processes progressing rapidly. 0     - Dead  Karnofsky DA, Abelmann [place], Craver LS and Burchenal [facility] [phone]) The use of the nitrogen mustards in the palliative treatment of carcinoma: with particular reference to bronchogenic carcinoma Cancer 1 [phone]  Lab Findings: Lab Results  Component Value Date   WBC 4.4 03/05/2021   HGB 13.3 03/05/2021   HCT 38.8 03/05/2021   MCV 84.9 03/05/2021   PLT 157 03/05/2021    Radiographic Findings: MR BRAIN W WO CONTRAST  Result Date: 03/02/2021 CLINICAL DATA:  Brain/CNS neoplasm, assess treatment response; glioblastoma follow-up EXAM: MRI HEAD WITHOUT AND WITH CONTRAST TECHNIQUE: Multiplanar, multiecho pulse sequences of the brain and surrounding structures were obtained without and with intravenous contrast. CONTRAST:  6mL GADAVIST GADOBUTROL 1 MMOL/ML IV SOLN COMPARISON:  11/22/2020 FINDINGS: Brain: Postoperative changes in the left temporal lobe have expectedly evolved. Thin underlying extra-axial collection remains. Area  of enhancement at the superior margin of the cavity has resolved. There are now more well-defined foci of enhancement deep to the posterior insula (series 16, image 72). Together the cluster measures approximately 1.2 cm. T2 FLAIR hyperintensity is present in this region. However, overall T2 FLAIR hyperintensity has significantly decreased. There is no acute infarction or intracranial hemorrhage. No significant mass effect. No hydrocephalus. Vascular: Major vessel flow voids at the skull base are preserved. Skull and upper cervical spine: Normal marrow signal is preserved. Left pterional craniotomy. Sinuses/Orbits: Paranasal sinuses are aerated. Orbits are unremarkable. Other: Sella is unremarkable.  Mastoid air cells are clear. IMPRESSION: Resolution of area of enhancement superior to the resection site. Significant decrease in abnormal T2 FLAIR hyperintensity. More well-defined cluster of enhancement deep to  the posterior left insula with persistent T2 FLAIR hyperintensity in this area. Electronically Signed   By: Macy Mis M.D.   On: 03/02/2021 10:44    Impression/Plan: Today I had a good conversation with the patient as well as her cousin who was on speaker phone.  She is recovering reasonably well from radiation.  She has some confusion regarding her Temodar dosing and I wrote it down on a piece of paper based on Dr. Renda Rolls recent instructions.  She understands this is pulsatile.  She has some tachycardia which has been present off and on for months.  I recommended that she discuss this with her primary doctor or Dr. Mickeal Skinner if it is persistent.  It is not severe and she has a regular rhythm on exam today.  She will continue to take over the counter cough suppressants for her dry cough.  I recommended that she pursue vaccinations for COVID and the flu but she declines these.  I will see her back on an as-needed basis.  She is pleased with this plan and will follow closely with Dr. Mickeal Skinner.  On date of service, in total, I spent 25 minutes on this encounter. Patient was seen in person.  _____________________________________   Eppie Gibson, MD

## 2021-03-13 NOTE — Progress Notes (Signed)
[name] presents today for follow-up after completing radiation to her brain on 02/07/2021  Dose of Decadron, if applicable: Currently not taking any dexamethasone (may resume 1 mg daily if needed per Dr. Renda Rolls nurse)   Recent neurologic symptoms, if any:  Seizures: Reports feeling the early symptoms one time since completing radiation/oral chemo. States she took her sublingual Ativan, laid down, and symptoms resolved (noted in Dr. Renda Rolls last office note on 03/05/21). Denies any other occurrences. Headaches: Denies any new or worsening episodes. Continues to deal with intermitent sharp pains to her left anterior/left posterior areas of her head. States discomfort will resolve on its own without taking any OTC pain medication  Nausea: Denies any nausea, but does report appetite has waned again since stopping dexamethasone; admits she mainly eats around her medication times to prevent stomach upset Wt Readings from Last 3 Encounters:  03/13/21 187 lb 4 oz (84.9 kg)  03/05/21 190 lb 4 oz (86.3 kg)  02/05/21 178 lb 8 oz (81 kg)   Dizziness/ataxia: Reports occasional dizziness with extreme fatigue when she's up moving around (will immediately go lay down, and reports system resolve) Difficulty with hand coordination: Patient denies Focal numbness/weakness: Patient denies  Visual deficits/changes: Denies any new or worsening symptoms. Reports continues occasional blurry vision, but states it resolves when she wears her glasses Confusion/Memory deficits: Patient denies any new issues or concerns  Additional Complaints / other details: Reports she'll restart her Temodar prescription tonight at an increased dose (300 mg). Has noticed a cramping/tingling sensation to her hands and feet/ankles + left side of face. States it often resolves after she massages areas for some time. Reports a persistent dry cough that often occurs in the evenings and mornings.

## 2021-03-20 ENCOUNTER — Encounter: Payer: Self-pay | Admitting: *Deleted

## 2021-03-20 ENCOUNTER — Encounter: Payer: Self-pay | Admitting: Internal Medicine

## 2021-03-20 ENCOUNTER — Telehealth: Payer: Self-pay | Admitting: *Deleted

## 2021-03-20 ENCOUNTER — Other Ambulatory Visit: Payer: Self-pay | Admitting: *Deleted

## 2021-03-20 MED ORDER — DEXAMETHASONE 1 MG PO TABS: 1.0000 mg | ORAL_TABLET | Freq: Every day | ORAL | 1 refills | Status: DC

## 2021-03-20 NOTE — Telephone Encounter (Signed)
See my chart message

## 2021-03-20 NOTE — Progress Notes (Signed)
Patient was agreeable to taking Decadron 1 mg daily to see if that lessens her side effects.  She needed a refill.

## 2021-03-26 ENCOUNTER — Other Ambulatory Visit: Payer: Self-pay | Admitting: Internal Medicine

## 2021-03-26 DIAGNOSIS — C719 Malignant neoplasm of brain, unspecified: Secondary | ICD-10-CM

## 2021-03-26 NOTE — Telephone Encounter (Signed)
Patient is to be seen by provider and have labs prior to refilling of Temodar.

## 2021-04-10 ENCOUNTER — Inpatient Hospital Stay: Payer: BC Managed Care – PPO | Attending: Internal Medicine

## 2021-04-10 ENCOUNTER — Inpatient Hospital Stay: Payer: BC Managed Care – PPO

## 2021-04-10 ENCOUNTER — Inpatient Hospital Stay (HOSPITAL_BASED_OUTPATIENT_CLINIC_OR_DEPARTMENT_OTHER): Payer: BC Managed Care – PPO | Admitting: Internal Medicine

## 2021-04-10 ENCOUNTER — Other Ambulatory Visit: Payer: Self-pay | Admitting: *Deleted

## 2021-04-10 ENCOUNTER — Other Ambulatory Visit: Payer: Self-pay

## 2021-04-10 ENCOUNTER — Telehealth: Payer: Self-pay | Admitting: *Deleted

## 2021-04-10 VITALS — BP 115/77 | HR 93 | Temp 97.9°F | Resp 18 | Ht 68.0 in | Wt 182.3 lb

## 2021-04-10 VITALS — BP 105/71 | HR 76 | Resp 18

## 2021-04-10 DIAGNOSIS — E876 Hypokalemia: Secondary | ICD-10-CM | POA: Diagnosis not present

## 2021-04-10 DIAGNOSIS — I10 Essential (primary) hypertension: Secondary | ICD-10-CM | POA: Diagnosis not present

## 2021-04-10 DIAGNOSIS — Z801 Family history of malignant neoplasm of trachea, bronchus and lung: Secondary | ICD-10-CM | POA: Insufficient documentation

## 2021-04-10 DIAGNOSIS — Z800 Family history of malignant neoplasm of digestive organs: Secondary | ICD-10-CM | POA: Diagnosis not present

## 2021-04-10 DIAGNOSIS — R569 Unspecified convulsions: Secondary | ICD-10-CM | POA: Diagnosis not present

## 2021-04-10 DIAGNOSIS — C719 Malignant neoplasm of brain, unspecified: Secondary | ICD-10-CM

## 2021-04-10 DIAGNOSIS — Z90710 Acquired absence of both cervix and uterus: Secondary | ICD-10-CM | POA: Diagnosis not present

## 2021-04-10 LAB — CBC WITH DIFFERENTIAL (CANCER CENTER ONLY)
Abs Immature Granulocytes: 0.01 10*3/uL (ref 0.00–0.07)
Basophils Absolute: 0 10*3/uL (ref 0.0–0.1)
Basophils Relative: 0 %
Eosinophils Absolute: 0.1 10*3/uL (ref 0.0–0.5)
Eosinophils Relative: 3 %
HCT: 34.6 % — ABNORMAL LOW (ref 36.0–46.0)
Hemoglobin: 11.9 g/dL — ABNORMAL LOW (ref 12.0–15.0)
Immature Granulocytes: 0 %
Lymphocytes Relative: 22 %
Lymphs Abs: 1 10*3/uL (ref 0.7–4.0)
MCH: 29.9 pg (ref 26.0–34.0)
MCHC: 34.4 g/dL (ref 30.0–36.0)
MCV: 86.9 fL (ref 80.0–100.0)
Monocytes Absolute: 0.3 10*3/uL (ref 0.1–1.0)
Monocytes Relative: 8 %
Neutro Abs: 2.9 10*3/uL (ref 1.7–7.7)
Neutrophils Relative %: 67 %
Platelet Count: 206 10*3/uL (ref 150–400)
RBC: 3.98 MIL/uL (ref 3.87–5.11)
RDW: 14.6 % (ref 11.5–15.5)
WBC Count: 4.3 10*3/uL (ref 4.0–10.5)
nRBC: 0 % (ref 0.0–0.2)

## 2021-04-10 LAB — CMP (CANCER CENTER ONLY)
ALT: 15 U/L (ref 0–44)
AST: 13 U/L — ABNORMAL LOW (ref 15–41)
Albumin: 4.3 g/dL (ref 3.5–5.0)
Alkaline Phosphatase: 91 U/L (ref 38–126)
Anion gap: 13 (ref 5–15)
BUN: 13 mg/dL (ref 6–20)
CO2: 28 mmol/L (ref 22–32)
Calcium: 9.8 mg/dL (ref 8.9–10.3)
Chloride: 102 mmol/L (ref 98–111)
Creatinine: 1.33 mg/dL — ABNORMAL HIGH (ref 0.44–1.00)
GFR, Estimated: 48 mL/min — ABNORMAL LOW (ref >60)
Glucose, Bld: 133 mg/dL — ABNORMAL HIGH (ref 70–99)
Potassium: 2.5 mmol/L — CL (ref 3.5–5.1)
Sodium: 143 mmol/L (ref 135–145)
Total Bilirubin: 1.1 mg/dL (ref 0.3–1.2)
Total Protein: 7.3 g/dL (ref 6.5–8.1)

## 2021-04-10 LAB — SAMPLE TO BLOOD BANK

## 2021-04-10 MED ORDER — SODIUM CHLORIDE 0.9 % IV SOLN: Freq: Once | INTRAVENOUS | Status: AC

## 2021-04-10 MED ORDER — POTASSIUM CHLORIDE CRYS ER 20 MEQ PO TBCR
20.0000 meq | EXTENDED_RELEASE_TABLET | Freq: Once | ORAL | Status: AC
  Administered 2021-04-10: 20 meq via ORAL
  Filled 2021-04-10: qty 1

## 2021-04-10 MED ORDER — POTASSIUM CHLORIDE 10 MEQ/100ML IV SOLN
10.0000 meq | INTRAVENOUS | Status: AC
  Administered 2021-04-10 (×2): 10 meq via INTRAVENOUS
  Filled 2021-04-10: qty 100

## 2021-04-10 MED ORDER — AMLODIPINE BESYLATE 5 MG PO TABS: 5.0000 mg | ORAL_TABLET | Freq: Every day | ORAL | 3 refills | Status: DC

## 2021-04-10 MED ORDER — POTASSIUM CHLORIDE CRYS ER 20 MEQ PO TBCR: 20.0000 meq | EXTENDED_RELEASE_TABLET | Freq: Once | ORAL | Status: DC

## 2021-04-10 MED ORDER — POTASSIUM CHLORIDE 10 MEQ/100ML IV SOLN: 10.0000 meq | INTRAVENOUS | Status: DC

## 2021-04-10 NOTE — Progress Notes (Signed)
[facility] [place], [place] [address] [phone]   Interval Evaluation  Date of Service: 04/10/21 Patient Name: [name] Patient MRN: [id number] Patient DOB: 1965-08-24 Provider: Ventura Sellers, MD  Identifying Statement:  ARGUSTA [name] is a 55 y.o. female with left temporal glioblastoma   Oncologic History: Oncology History  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[name])  11/21/2020 Initial Diagnosis   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[name])   11/21/2020 Cancer Staging   Staging form: Brain and Spinal Cord, AJCC 8th Edition - Pathologic stage from 11/21/2020: WHO Grade IV - Signed by Ventura Sellers, MD on 11/30/2020 Histopathologic type: Glioblastoma Stage prefix: Initial diagnosis Histologic grading system: 4 grade system Extent of surgical resection: Subtotal resection Karnofsky performance status: Score 90 Seizures at presentation: Present Duration of symptoms before diagnosis: Short IDH1 mutation: Negative    12/27/2020 - 12/27/2020 Chemotherapy   Patient is on Treatment Plan : BRAIN GLIOBLASTOMA Radiation Therapy With Concurrent Temozolomide 75 mg/m2 Daily Followed By Sequential Maintenance Temozolomide x 6-12 cycles     03/12/2021 -  Chemotherapy   Patient is on Treatment Plan : BRAIN GLIOBLASTOMA Consolidation Temozolomide Days 1-5 q28 Days        Biomarkers:  MGMT Methylated.  IDH 1/2 Wild type.  EGFR Unknown  TERT Unknown   Interval History: [name] presents today for follow up, now having completed first cycle of 5-day Temodar.  She complains of significant fatigue in recent days, as well as "cramping all over".  She had nausea with one evening having not taken the zofran prior to Temodar dosing.  Short term memory impairment and fatigue are more persistent and noticeable.  No longer taking decadron.    H+P (11/25/20) Patient presented to  medical attention this past month with first ever seizure.  She describes episode of sudden onset "disconnection" with tongue biting and urination, followed by period of confusion.  CNS imaging demonstrated enhancing mass within left anterior temporal lobe, which was mostly resected by Dr. Zada Finders on 11/21/20.  Since surgery she had not had recurrence of seizures.  She complains of painful cramping of her right leg, which started ~1 week prior.  At this time the cramping is her main complaint.  Otherwise fully functional, independent, would like to return to work if possible.  Medications: Current Outpatient Medications on File Prior to Visit  Medication Sig Dispense Refill   atorvastatin (LIPITOR) 20 MG tablet Take 1 tablet (20 mg total) by mouth daily. 90 tablet 1   CVS D3 125 MCG (5000 UT) capsule TAKE 1 CAPSULE (5,000 UNITS TOTAL) BY MOUTH DAILY. 90 capsule 3   dexamethasone (DECADRON) 1 MG tablet Take 1 tablet (1 mg total) by mouth daily. 30 tablet 1   hydrochlorothiazide (HYDRODIURIL) 25 MG tablet TAKE 1 TABLET (25 MG TOTAL) BY MOUTH DAILY. 90 tablet 0   lacosamide (VIMPAT) 50 MG TABS tablet Take 1 tablet (50 mg total) by mouth 2 (two) times daily. 60 tablet 3   levETIRAcetam (KEPPRA) 750 MG tablet Take 2 tablets (1,500 mg total) by mouth 2 (two) times daily. 120 tablet 3   levothyroxine (SYNTHROID) 100 MCG tablet TAKE 1 TABLET BY MOUTH EVERY DAY 90 tablet 3   LORazepam (ATIVAN) 2 MG tablet Take 1 tablet (2 mg total) by mouth every 8 (eight) hours as needed for seizure. 12 tablet 0   losartan (COZAAR) 100 MG tablet TAKE 1 TABLET BY MOUTH  EVERY DAY 90 tablet 0   omeprazole (PRILOSEC) 20 MG capsule Take 1 capsule (20 mg total) by mouth daily. Take while on Dexamethasone. 30 capsule 1   ondansetron (ZOFRAN) 8 MG tablet Take 1 tablet by mouth twice daily as needed for nausea & vomiting. May take 30-60 minutes prior to Temodar administration if nausea/vomiting occurs. 30 tablet 1   potassium  chloride 20 MEQ TBCR Take 20 mEq by mouth daily. 90 tablet 1   temozolomide (TEMODAR) 100 MG capsule Take 3 capsules (300 mg total) by mouth daily. Take for 5 days on, 23 days off. Repeat every 28 days. May take on an empty stomach to decrease nausea & vomiting. 15 capsule 0   No current facility-administered medications on file prior to visit.    Allergies: No Known Allergies Past Medical History:  Past Medical History:  Diagnosis Date   Bell's palsy    left side of face- notied in smile and eyes, if very tired   Brain cancer ([place])    Complication of anesthesia    N/V   Essential hypertension    External hemorrhoids    History of blood transfusion    post op Hemorroids   History of pneumonia    Hyperlipidemia    Hyperthyroidism    hx 55 years old- oral meds   Iron deficiency anemia    Migraine headache    Pneumonia    x 2 - years ago   PONV (postoperative nausea and vomiting)    PPD positive    Pulmonary nodules    not seen on plain cxr, first detected 1/07 re ct 10/08 pos IPPD > 15 mm 12/09 FOB/lavage 04/20/08 neg afb smear   PVC's (premature ventricular contractions)    Seizure ([place]) 11/15/2020   Sleep apnea    refused CPAP- mouth piece recommended   Tuberculosis    in her late 30's   Past Surgical History:  Past Surgical History:  Procedure Laterality Date   ABDOMINAL HYSTERECTOMY     APPLICATION OF CRANIAL NAVIGATION N/A 11/21/2020   Procedure: APPLICATION OF CRANIAL NAVIGATION;  Surgeon: Judith Part, MD;  Location: [facility];  Service: Neurosurgery;  Laterality: N/A;   BREAST BIOPSY Right    CHOLECYSTECTOMY  2014   COLONOSCOPY  05/04/2009   prolapsing external hemorrhoids   CRANIOTOMY Left 11/21/2020   Procedure: Left craniotomy for tumor resection;  Surgeon: Judith Part, MD;  Location: [facility];  Service: Neurosurgery;  Laterality: Left;   ENDOMETRIAL ABLATION     HEMORRHOID SURGERY     x 3   TUBAL LIGATION     Social History:  Social History    Socioeconomic History   Marital status: Divorced    Spouse name: Not on file   Number of children: 2   Years of education: Not on file   Highest education level: Not on file  Occupational History   Occupation: accounts Sport and exercise psychologist: [employer]  Tobacco Use   Smoking status: Never   Smokeless tobacco: Never  Vaping Use   Vaping Use: Never used  Substance and Sexual Activity   Alcohol use: No    Alcohol/week: 0.0 standard drinks   Drug use: No   Sexual activity: Not on file  Other Topics Concern   Not on file  Social History Narrative   Divorced with children   She works in Press photographer    Possible TB exposure 2008 (after nodules found)   From [place] lived there and  [place]   Never owned birds      Social Determinants of Radio broadcast assistant Strain: Not on file  Food Insecurity: Not on file  Transportation Needs: Not on file  Physical Activity: Not on file  Stress: Not on file  Social Connections: Not on file  Intimate Partner Violence: Not on file   Family History:  Family History  Problem Relation Age of Onset   Heart disease Maternal Grandmother        aunts, uncles, sister   Diabetes Mellitus II Maternal Grandmother    Kidney failure Sister    Diabetes Mellitus II Sister    Lung cancer Other        aunts and uncles   Colon cancer Maternal Aunt        aunts and uncles   Diabetes Mellitus II Maternal Aunt    Colon cancer Maternal Uncle    Diabetes Mellitus II Maternal Uncle    Diabetes type II Mother     Review of Systems: Constitutional: Doesn't report fevers, chills or abnormal weight loss Eyes: Doesn't report blurriness of vision Ears, nose, mouth, throat, and face: Doesn't report sore throat Respiratory: Doesn't report cough, dyspnea or wheezes Cardiovascular: Doesn't report palpitation, chest discomfort  Gastrointestinal:  Doesn't report nausea, constipation, diarrhea GU: Doesn't report incontinence Skin: Doesn't report skin  rashes Neurological: Per HPI Musculoskeletal: Doesn't report joint pain Behavioral/Psych: Doesn't report anxiety  Physical Exam: Vitals with BMI 04/10/2021 03/13/2021 03/05/2021  Height _0  _1  -  Weight 182 lbs 5 oz 187 lbs 4 oz 190 lbs 4 oz  BMI 56.21 30.86 -  Systolic [phone]  Diastolic 77 73 91  Pulse 93 118 102    KPS: 90. General: Alert, cooperative, pleasant, in no acute distress Head: Normal EENT: No conjunctival injection or scleral icterus.  Lungs: Resp effort normal Cardiac: Regular rate Abdomen: Non-distended abdomen Skin: No rashes cyanosis or petechiae. Extremities: No clubbing or edema  Neurologic Exam: Mental Status: Awake, alert, attentive to examiner. Oriented to self and environment. Language is fluent with intact comprehension.  Cranial Nerves: Visual acuity is grossly normal. Visual fields are full. Extra-ocular movements intact. No ptosis. Face L LMN paresis, chronic. Motor: Tone and bulk are normal. Power is full in both arms and legs. Reflexes are symmetric, no pathologic reflexes present.  Sensory: Intact to light touch Gait: Normal.   Labs: I have reviewed the data as listed    Component Value Date/Time   NA 143 04/10/2021 1210   K 2.5 (LL) 04/10/2021 1210   CL 102 04/10/2021 1210   CO2 28 04/10/2021 1210   GLUCOSE 133 (H) 04/10/2021 1210   BUN 13 04/10/2021 1210   CREATININE 1.33 (H) 04/10/2021 1210   CREATININE 0.92 01/25/2020 1424   CALCIUM 9.8 04/10/2021 1210   PROT 7.3 04/10/2021 1210   ALBUMIN 4.3 04/10/2021 1210   AST 13 (L) 04/10/2021 1210   ALT 15 04/10/2021 1210   ALKPHOS 91 04/10/2021 1210   BILITOT 1.1 04/10/2021 1210   GFRNONAA 48 (L) 04/10/2021 1210   GFRNONAA 72 01/25/2020 1424   GFRAA 83 01/25/2020 1424   Lab Results  Component Value Date   WBC 4.3 04/10/2021   NEUTROABS 2.9 04/10/2021   HGB 11.9 (L) 04/10/2021   HCT 34.6 (L) 04/10/2021   MCV 86.9 04/10/2021   PLT 206 04/10/2021     Assessment/Plan 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[place]) [C71.9]  [place] presents today with clinically  symptomatic hypokalemia.  Etiology may be use of loop diuretic, in addition to poor PO intake with vomiting.    We will administer 22mq of potassium chloride IV today, as well as give 263m orally as now dose.  Will also recommend discontinuing HCTZ; for HTN will bridge with Norvasc 57m33maily until she is able to visit with her PCP.  Cycle #2 of Temodar will be withheld until hypokalemia is resolved.  We will bring her back in 7 days for re-evaluation.  In the meantime she will con't to dose 33m41maily Kcl at home.   For seizures, will con't Vimpat 50mg62m and Keppra 1500mg 72m  Can also continue rescue ativan, 2mg PO12mN seizure aura  Decadron may also resume at 1mg dai67mas discussed.  We ask that Alan JENESE MISCHKEto clinic in 1 week to recheck labs prior to cycle #2.  All questions were answered. The patient knows to call the clinic with any problems, questions or concerns. No barriers to learning were detected.  The total time spent in the encounter was 40 minutes and more than 50% was on counseling and review of test results   Lorana Maffeo Ventura Sellersical Director of Neuro-Oncology [facility] [email or web address] 1:02 PM

## 2021-04-10 NOTE — Patient Instructions (Signed)

## 2021-04-10 NOTE — Telephone Encounter (Signed)
Pam from lab called with critical potassium 2.5 for pt. Provider made aware. Pt will receive fluids and K+ IV along with oral K+ as well.

## 2021-04-11 ENCOUNTER — Telehealth: Payer: Self-pay | Admitting: Internal Medicine

## 2021-04-11 NOTE — Telephone Encounter (Signed)
Scheduled per 12/6 los, pt has been called and confirmed

## 2021-04-19 ENCOUNTER — Other Ambulatory Visit: Payer: Self-pay

## 2021-04-19 ENCOUNTER — Inpatient Hospital Stay (HOSPITAL_BASED_OUTPATIENT_CLINIC_OR_DEPARTMENT_OTHER): Payer: BC Managed Care – PPO | Admitting: Internal Medicine

## 2021-04-19 ENCOUNTER — Inpatient Hospital Stay (HOSPITAL_BASED_OUTPATIENT_CLINIC_OR_DEPARTMENT_OTHER): Payer: BC Managed Care – PPO

## 2021-04-19 VITALS — BP 118/85 | HR 81 | Temp 97.7°F | Resp 20 | Wt 184.9 lb

## 2021-04-19 DIAGNOSIS — E876 Hypokalemia: Secondary | ICD-10-CM | POA: Diagnosis not present

## 2021-04-19 DIAGNOSIS — Z801 Family history of malignant neoplasm of trachea, bronchus and lung: Secondary | ICD-10-CM | POA: Diagnosis not present

## 2021-04-19 DIAGNOSIS — R569 Unspecified convulsions: Secondary | ICD-10-CM

## 2021-04-19 DIAGNOSIS — I10 Essential (primary) hypertension: Secondary | ICD-10-CM | POA: Diagnosis not present

## 2021-04-19 DIAGNOSIS — Z90710 Acquired absence of both cervix and uterus: Secondary | ICD-10-CM | POA: Diagnosis not present

## 2021-04-19 DIAGNOSIS — Z800 Family history of malignant neoplasm of digestive organs: Secondary | ICD-10-CM | POA: Diagnosis not present

## 2021-04-19 DIAGNOSIS — C719 Malignant neoplasm of brain, unspecified: Secondary | ICD-10-CM | POA: Diagnosis not present

## 2021-04-19 LAB — CMP (CANCER CENTER ONLY)
ALT: 14 U/L (ref 0–44)
AST: 10 U/L — ABNORMAL LOW (ref 15–41)
Albumin: 3.9 g/dL (ref 3.5–5.0)
Alkaline Phosphatase: 98 U/L (ref 38–126)
Anion gap: 11 (ref 5–15)
BUN: 18 mg/dL (ref 6–20)
CO2: 31 mmol/L (ref 22–32)
Calcium: 9.6 mg/dL (ref 8.9–10.3)
Chloride: 100 mmol/L (ref 98–111)
Creatinine: 1.05 mg/dL — ABNORMAL HIGH (ref 0.44–1.00)
GFR, Estimated: >60 mL/min (ref >60)
Glucose, Bld: 131 mg/dL — ABNORMAL HIGH (ref 70–99)
Potassium: 2.8 mmol/L — ABNORMAL LOW (ref 3.5–5.1)
Sodium: 142 mmol/L (ref 135–145)
Total Bilirubin: 0.7 mg/dL (ref 0.3–1.2)
Total Protein: 6.8 g/dL (ref 6.5–8.1)

## 2021-04-19 LAB — CBC WITH DIFFERENTIAL (CANCER CENTER ONLY)
Abs Immature Granulocytes: 0.05 10*3/uL (ref 0.00–0.07)
Basophils Absolute: 0 10*3/uL (ref 0.0–0.1)
Basophils Relative: 1 %
Eosinophils Absolute: 0.1 10*3/uL (ref 0.0–0.5)
Eosinophils Relative: 2 %
HCT: 34 % — ABNORMAL LOW (ref 36.0–46.0)
Hemoglobin: 11.8 g/dL — ABNORMAL LOW (ref 12.0–15.0)
Immature Granulocytes: 1 %
Lymphocytes Relative: 34 %
Lymphs Abs: 1.9 10*3/uL (ref 0.7–4.0)
MCH: 29.8 pg (ref 26.0–34.0)
MCHC: 34.7 g/dL (ref 30.0–36.0)
MCV: 85.9 fL (ref 80.0–100.0)
Monocytes Absolute: 0.4 10*3/uL (ref 0.1–1.0)
Monocytes Relative: 7 %
Neutro Abs: 3.1 10*3/uL (ref 1.7–7.7)
Neutrophils Relative %: 55 %
Platelet Count: 224 10*3/uL (ref 150–400)
RBC: 3.96 MIL/uL (ref 3.87–5.11)
RDW: 14 % (ref 11.5–15.5)
WBC Count: 5.5 10*3/uL (ref 4.0–10.5)
nRBC: 0 % (ref 0.0–0.2)

## 2021-04-19 MED ORDER — ONDANSETRON HCL 8 MG PO TABS: 8.0000 mg | ORAL_TABLET | Freq: Three times a day (TID) | ORAL | 1 refills | Status: DC | PRN

## 2021-04-19 MED ORDER — TEMOZOLOMIDE 100 MG PO CAPS: 150.0000 mg/m2/d | ORAL_CAPSULE | Freq: Every day | ORAL | 0 refills | Status: DC

## 2021-04-19 NOTE — Progress Notes (Signed)
[facility] [place], [place] [address] [phone]   Interval Evaluation  Date of Service: 04/19/21 Patient Name: [name] Patient MRN: [id number] Patient DOB: 1965/12/24 Provider: Ventura Sellers, MD  Identifying Statement:  [name] is a 55 y.o. female with left temporal glioblastoma   Oncologic History: Oncology History  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[place])  11/21/2020 Initial Diagnosis   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[place])   11/21/2020 Cancer Staging   Staging form: Brain and Spinal Cord, AJCC 8th Edition - Pathologic stage from 11/21/2020: WHO Grade IV - Signed by Ventura Sellers, MD on 11/30/2020 Histopathologic type: Glioblastoma Stage prefix: Initial diagnosis Histologic grading system: 4 grade system Extent of surgical resection: Subtotal resection Karnofsky performance status: Score 90 Seizures at presentation: Present Duration of symptoms before diagnosis: Short IDH1 mutation: Negative    12/27/2020 - 12/27/2020 Chemotherapy   Patient is on Treatment Plan : BRAIN GLIOBLASTOMA Radiation Therapy With Concurrent Temozolomide 75 mg/m2 Daily Followed By Sequential Maintenance Temozolomide x 6-12 cycles     03/12/2021 -  Chemotherapy   Patient is on Treatment Plan : BRAIN GLIOBLASTOMA Consolidation Temozolomide Days 1-5 q28 Days        Biomarkers:  MGMT Methylated.  IDH 1/2 Wild type.  EGFR Unknown  TERT Unknown   Interval History: [name] presents today for follow up with labs for evaluation.  Fatigue and cramping are still present, but improved from prior.  Nausea has improved now off chemo past 2 weeks.  Short term memory impairment and fatigue are still persistent.  She did not stop her HCTZ but did start the norvasc.    H+P (11/25/20) Patient presented to medical attention this past month with first ever seizure.  She describes  episode of sudden onset "disconnection" with tongue biting and urination, followed by period of confusion.  CNS imaging demonstrated enhancing mass within left anterior temporal lobe, which was mostly resected by Dr. Zada Finders on 11/21/20.  Since surgery she had not had recurrence of seizures.  She complains of painful cramping of her right leg, which started ~1 week prior.  At this time the cramping is her main complaint.  Otherwise fully functional, independent, would like to return to work if possible.  Medications: Current Outpatient Medications on File Prior to Visit  Medication Sig Dispense Refill   amLODipine (NORVASC) 5 MG tablet Take 1 tablet (5 mg total) by mouth daily. 30 tablet 3   atorvastatin (LIPITOR) 20 MG tablet Take 1 tablet (20 mg total) by mouth daily. 90 tablet 1   [facility] D3 125 MCG (5000 UT) capsule TAKE 1 CAPSULE (5,000 UNITS TOTAL) BY MOUTH DAILY. 90 capsule 3   dexamethasone (DECADRON) 1 MG tablet Take 1 tablet (1 mg total) by mouth daily. 30 tablet 1   lacosamide (VIMPAT) 50 MG TABS tablet Take 1 tablet (50 mg total) by mouth 2 (two) times daily. 60 tablet 3   levETIRAcetam (KEPPRA) 750 MG tablet Take 2 tablets (1,500 mg total) by mouth 2 (two) times daily. 120 tablet 3   levothyroxine (SYNTHROID) 100 MCG tablet TAKE 1 TABLET BY MOUTH EVERY DAY 90 tablet 3   LORazepam (ATIVAN) 2 MG tablet Take 1 tablet (2 mg total) by mouth every 8 (eight) hours as needed for seizure. (Patient not taking: Reported on 04/19/2021) 12 tablet 0   losartan (COZAAR) 100 MG tablet TAKE 1 TABLET BY MOUTH EVERY DAY 90  tablet 0   omeprazole (PRILOSEC) 20 MG capsule Take 1 capsule (20 mg total) by mouth daily. Take while on Dexamethasone. 30 capsule 1   ondansetron (ZOFRAN) 8 MG tablet Take 1 tablet by mouth twice daily as needed for nausea & vomiting. May take 30-60 minutes prior to Temodar administration if nausea/vomiting occurs. 30 tablet 1   potassium chloride 20 MEQ TBCR Take 20 mEq by mouth daily.  90 tablet 1   temozolomide (TEMODAR) 100 MG capsule Take 3 capsules (300 mg total) by mouth daily. Take for 5 days on, 23 days off. Repeat every 28 days. May take on an empty stomach to decrease nausea & vomiting. 15 capsule 0   No current facility-administered medications on file prior to visit.    Allergies: No Known Allergies Past Medical History:  Past Medical History:  Diagnosis Date   Bell's palsy    left side of face- notied in smile and eyes, if very tired   Brain cancer ([place])    Complication of anesthesia    N/V   Essential hypertension    External hemorrhoids    History of blood transfusion    post op Hemorroids   History of pneumonia    Hyperlipidemia    Hyperthyroidism    hx 55 years old- oral meds   Iron deficiency anemia    Migraine headache    Pneumonia    x 2 - years ago   PONV (postoperative nausea and vomiting)    PPD positive    Pulmonary nodules    not seen on plain cxr, first detected 1/07 re ct 10/08 pos IPPD > 15 mm 12/09 FOB/lavage 04/20/08 neg afb smear   PVC's (premature ventricular contractions)    Seizure ([place]) 11/15/2020   Sleep apnea    refused CPAP- mouth piece recommended   Tuberculosis    in her late 30's   Past Surgical History:  Past Surgical History:  Procedure Laterality Date   ABDOMINAL HYSTERECTOMY     APPLICATION OF CRANIAL NAVIGATION N/A 11/21/2020   Procedure: APPLICATION OF CRANIAL NAVIGATION;  Surgeon: Judith Part, MD;  Location: [facility];  Service: Neurosurgery;  Laterality: N/A;   BREAST BIOPSY Right    CHOLECYSTECTOMY  2014   COLONOSCOPY  05/04/2009   prolapsing external hemorrhoids   CRANIOTOMY Left 11/21/2020   Procedure: Left craniotomy for tumor resection;  Surgeon: Judith Part, MD;  Location: [facility];  Service: Neurosurgery;  Laterality: Left;   ENDOMETRIAL ABLATION     HEMORRHOID SURGERY     x 3   TUBAL LIGATION     Social History:  Social History   Socioeconomic History   Marital status: Divorced     Spouse name: Not on file   Number of children: 2   Years of education: Not on file   Highest education level: Not on file  Occupational History   Occupation: accounts Sport and exercise psychologist: [employer]  Tobacco Use   Smoking status: Never   Smokeless tobacco: Never  Vaping Use   Vaping Use: Never used  Substance and Sexual Activity   Alcohol use: No    Alcohol/week: 0.0 standard drinks   Drug use: No   Sexual activity: Not on file  Other Topics Concern   Not on file  Social History Narrative   Divorced with children   She works in Press photographer    Possible TB exposure 2008 (after nodules found)   From [place] lived there and [place]  Never owned birds      Social Determinants of Radio broadcast assistant Strain: Not on file  Food Insecurity: Not on file  Transportation Needs: Not on file  Physical Activity: Not on file  Stress: Not on file  Social Connections: Not on file  Intimate Partner Violence: Not on file   Family History:  Family History  Problem Relation Age of Onset   Heart disease Maternal Grandmother        aunts, uncles, sister   Diabetes Mellitus II Maternal Grandmother    Kidney failure Sister    Diabetes Mellitus II Sister    Lung cancer Other        aunts and uncles   Colon cancer Maternal Aunt        aunts and uncles   Diabetes Mellitus II Maternal Aunt    Colon cancer Maternal Uncle    Diabetes Mellitus II Maternal Uncle    Diabetes type II Mother     Review of Systems: Constitutional: Doesn't report fevers, chills or abnormal weight loss Eyes: Doesn't report blurriness of vision Ears, nose, mouth, throat, and face: Doesn't report sore throat Respiratory: Doesn't report cough, dyspnea or wheezes Cardiovascular: Doesn't report palpitation, chest discomfort  Gastrointestinal:  Doesn't report nausea, constipation, diarrhea GU: Doesn't report incontinence Skin: Doesn't report skin rashes Neurological: Per HPI Musculoskeletal: Doesn't report joint  pain Behavioral/Psych: Doesn't report anxiety  Physical Exam: Vitals with BMI 04/19/2021 04/10/2021 04/10/2021  Height - - '5\' 8"'   Weight 184 lbs 14 oz - 182 lbs 5 oz  BMI 62.83 - 15.17  Systolic 616 073 710  Diastolic 85 71 77  Pulse 81 76 93    KPS: 90. General: Alert, cooperative, pleasant, in no acute distress Head: Normal EENT: No conjunctival injection or scleral icterus.  Lungs: Resp effort normal Cardiac: Regular rate Abdomen: Non-distended abdomen Skin: No rashes cyanosis or petechiae. Extremities: No clubbing or edema  Neurologic Exam: Mental Status: Awake, alert, attentive to examiner. Oriented to self and environment. Language is fluent with intact comprehension.  Cranial Nerves: Visual acuity is grossly normal. Visual fields are full. Extra-ocular movements intact. No ptosis. Face L LMN paresis, chronic. Motor: Tone and bulk are normal. Power is full in both arms and legs. Reflexes are symmetric, no pathologic reflexes present.  Sensory: Intact to light touch Gait: Normal.   Labs: I have reviewed the data as listed    Component Value Date/Time   NA 142 04/19/2021 1136   K 2.8 (L) 04/19/2021 1136   CL 100 04/19/2021 1136   CO2 31 04/19/2021 1136   GLUCOSE 131 (H) 04/19/2021 1136   BUN 18 04/19/2021 1136   CREATININE 1.05 (H) 04/19/2021 1136   CREATININE 0.92 01/25/2020 1424   CALCIUM 9.6 04/19/2021 1136   PROT 6.8 04/19/2021 1136   ALBUMIN 3.9 04/19/2021 1136   AST 10 (L) 04/19/2021 1136   ALT 14 04/19/2021 1136   ALKPHOS 98 04/19/2021 1136   BILITOT 0.7 04/19/2021 1136   GFRNONAA >60 04/19/2021 1136   GFRNONAA 72 01/25/2020 1424   GFRAA 83 01/25/2020 1424   Lab Results  Component Value Date   WBC 5.5 04/19/2021   NEUTROABS 3.1 04/19/2021   HGB 11.8 (L) 04/19/2021   HCT 34.0 (L) 04/19/2021   MCV 85.9 04/19/2021   PLT 224 04/19/2021    Assessment/Plan 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[place]) [C71.9]  [name]  presents today with clinically symptomatic hypokalemia, though improved from prior.  Etiology  is likely potassium wasting loop diuretic, which she did not stop as instructed.  We recommended immediate cessation of HCTZ, and continuation home KCl supplementation.  Cycle #2 of Temodar will again be withheld until potassium reaches at least 3.0.  We will repeat labs again next week.  For seizures, will con't Vimpat 67m BID and Keppra 15058mBID.  Can also continue rescue ativan, 33m24mO PRN seizure aura  Decadron may also con't 1mg79mily.  We ask that TijuDAELYN PETTAWAYurn to clinic in 1 week to recheck labs prior to cycle #2.  Next MRI brain scheduled for 1 month prior to cycle #3.  All questions were answered. The patient knows to call the clinic with any problems, questions or concerns. No barriers to learning were detected.  The total time spent in the encounter was 30 minutes and more than 50% was on counseling and review of test results   ZachVentura Sellers Medical Director of Neuro-Oncology [facility]15/22 12:35 PM

## 2021-04-20 ENCOUNTER — Telehealth: Payer: Self-pay | Admitting: Internal Medicine

## 2021-04-20 NOTE — Telephone Encounter (Signed)
Scheduled per 12/15 los, pt has been called and confirmed appt

## 2021-04-22 ENCOUNTER — Other Ambulatory Visit: Payer: Self-pay | Admitting: Internal Medicine

## 2021-04-23 ENCOUNTER — Ambulatory Visit (HOSPITAL_COMMUNITY): Payer: BC Managed Care – PPO

## 2021-04-23 ENCOUNTER — Encounter: Payer: Self-pay | Admitting: Internal Medicine

## 2021-04-24 ENCOUNTER — Ambulatory Visit (INDEPENDENT_AMBULATORY_CARE_PROVIDER_SITE_OTHER): Payer: BC Managed Care – PPO | Admitting: Adult Health

## 2021-04-24 ENCOUNTER — Encounter: Payer: Self-pay | Admitting: Adult Health

## 2021-04-24 VITALS — BP 128/80 | HR 74 | Temp 98.5°F | Wt 190.0 lb

## 2021-04-24 DIAGNOSIS — E876 Hypokalemia: Secondary | ICD-10-CM | POA: Diagnosis not present

## 2021-04-24 LAB — BASIC METABOLIC PANEL
BUN: 12 mg/dL (ref 6–23)
CO2: 30 mEq/L (ref 19–32)
Calcium: 9.5 mg/dL (ref 8.4–10.5)
Chloride: 103 mEq/L (ref 96–112)
Creatinine, Ser: 0.89 mg/dL (ref 0.40–1.20)
GFR: 73.97 mL/min (ref >60.00)
Glucose, Bld: 190 mg/dL — ABNORMAL HIGH (ref 70–99)
Potassium: 3.1 mEq/L — ABNORMAL LOW (ref 3.5–5.1)
Sodium: 140 mEq/L (ref 135–145)

## 2021-04-24 NOTE — Progress Notes (Signed)
Subjective:    Patient ID: [name], female    DOB: 1965-05-15, 55 y.o.   MRN: [id number]  HPI  55 year old female who  has a past medical history of Bell's palsy, Brain cancer ([place]), Complication of anesthesia, Essential hypertension, External hemorrhoids, History of blood transfusion, History of pneumonia, Hyperlipidemia, Hyperthyroidism, Iron deficiency anemia, Migraine headache, Pneumonia, PONV (postoperative nausea and vomiting), PPD positive, Pulmonary nodules, PVC's (premature ventricular contractions), Seizure ([place]) (11/15/2020), Sleep apnea, and Tuberculosis.  She presents to the office today for issues with hypokalemia.  She has been seeing oncology due to a left temporal glioblastoma.  During routine labs at the cancer center her potassium level has been low.  She is supplemented with 20 mill equivalents of K-Dur, was taken off HCTZ and started on Norvasc, but did not end up coming off the HCTZ as directed.  Today she reports that she has been off HCTZ for roughly 5 days.  Her potassium level 5 days ago was 2.8.  She has not been able to participate in chemotherapy over the last couple weeks due to hypokalemia.   Review of Systems See HPI   Past Medical History:  Diagnosis Date   Bell's palsy    left side of face- notied in smile and eyes, if very tired   Brain cancer ([place])    Complication of anesthesia    N/V   Essential hypertension    External hemorrhoids    History of blood transfusion    post op Hemorroids   History of pneumonia    Hyperlipidemia    Hyperthyroidism    hx 55 years old- oral meds   Iron deficiency anemia    Migraine headache    Pneumonia    x 2 - years ago   PONV (postoperative nausea and vomiting)    PPD positive    Pulmonary nodules    not seen on plain cxr, first detected 1/07 re ct 10/08 pos IPPD > 15 mm 12/09 FOB/lavage 04/20/08 neg afb smear   PVC's (premature ventricular contractions)    Seizure ([place]) 11/15/2020   Sleep apnea     refused CPAP- mouth piece recommended   Tuberculosis    in her late 26's    Social History   Socioeconomic History   Marital status: Divorced    Spouse name: Not on file   Number of children: 2   Years of education: Not on file   Highest education level: Associate degree: occupational, Hotel manager, or vocational program  Occupational History   Occupation: accounts Sport and exercise psychologist: [employer]  Tobacco Use   Smoking status: Never   Smokeless tobacco: Never  Vaping Use   Vaping Use: Never used  Substance and Sexual Activity   Alcohol use: No    Alcohol/week: 0.0 standard drinks   Drug use: No   Sexual activity: Not on file  Other Topics Concern   Not on file  Social History Narrative   Divorced with children   She works in Press photographer    Possible TB exposure 2008 (after nodules found)   From [place] lived there and [place]   Never owned birds      Social Determinants of Radio broadcast assistant Strain: Medium Risk   Difficulty of Paying Living Expenses: Somewhat hard  Food Insecurity: Landscape architect Present   Worried About Charity fundraiser in the Last Year: Sometimes true   Arboriculturist in the Last Year: Never true  Transportation Needs: No Data processing manager (Medical): No   Lack of Transportation (Non-Medical): No  Physical Activity: Unknown   Days of Exercise per Week: 0 days   Minutes of Exercise per Session: Not on file  Stress: No Stress Concern Present   Feeling of Stress : Only a little  Social Connections: Moderately Integrated   Frequency of Communication with Friends and Family: Twice a week   Frequency of Social Gatherings with Friends and Family: Twice a week   Attends Religious Services: More than 4 times per year   Active Member of Clubs or Organizations: Yes   Attends Music therapist: More than 4 times per year   Marital Status: Divorced  Human resources officer Violence: Not on file    Past Surgical History:   Procedure Laterality Date   ABDOMINAL HYSTERECTOMY     APPLICATION OF CRANIAL NAVIGATION N/A 11/21/2020   Procedure: [place];  Surgeon: Judith Part, MD;  Location: [facility];  Service: Neurosurgery;  Laterality: N/A;   BREAST BIOPSY Right    CHOLECYSTECTOMY  2014   COLONOSCOPY  05/04/2009   prolapsing external hemorrhoids   CRANIOTOMY Left 11/21/2020   Procedure: Left craniotomy for tumor resection;  Surgeon: Judith Part, MD;  Location: [facility];  Service: Neurosurgery;  Laterality: Left;   ENDOMETRIAL ABLATION     HEMORRHOID SURGERY     x 3   TUBAL LIGATION      Family History  Problem Relation Age of Onset   Heart disease Maternal Grandmother        aunts, uncles, sister   Diabetes Mellitus II Maternal Grandmother    Kidney failure Sister    Diabetes Mellitus II Sister    Lung cancer Other        aunts and uncles   Colon cancer Maternal Aunt        aunts and uncles   Diabetes Mellitus II Maternal Aunt    Colon cancer Maternal Uncle    Diabetes Mellitus II Maternal Uncle    Diabetes type II Mother     No Known Allergies  Current Outpatient Medications on File Prior to Visit  Medication Sig Dispense Refill   atorvastatin (LIPITOR) 20 MG tablet Take 1 tablet (20 mg total) by mouth daily. 90 tablet 1   CVS D3 125 MCG (5000 UT) capsule TAKE 1 CAPSULE (5,000 UNITS TOTAL) BY MOUTH DAILY. 90 capsule 3   dexamethasone (DECADRON) 1 MG tablet Take 1 tablet (1 mg total) by mouth daily. 30 tablet 1   lacosamide (VIMPAT) 50 MG TABS tablet Take 1 tablet (50 mg total) by mouth 2 (two) times daily. 60 tablet 3   levETIRAcetam (KEPPRA) 750 MG tablet TAKE 2 TABLETS (1,500 MG TOTAL) BY MOUTH 2 (TWO) TIMES DAILY. 360 tablet 1   levothyroxine (SYNTHROID) 100 MCG tablet TAKE 1 TABLET BY MOUTH EVERY DAY 90 tablet 3   ondansetron (ZOFRAN) 8 MG tablet Take 1 tablet (8 mg total) by mouth every 8 (eight) hours as needed (nausea and vomiting). 30 tablet 1    temozolomide (TEMODAR) 100 MG capsule Take 3 capsules (300 mg total) by mouth daily. Take for 5 days on, 23 days off. Repeat every 28 days. May take on an empty stomach to decrease nausea & vomiting. 15 capsule 0   temozolomide (TEMODAR) 100 MG capsule Take 3 capsules (300 mg total) by mouth daily. Take for 5 days on, 23 days off. Repeat every 28 days.  May take on an empty stomach to decrease nausea & vomiting. 15 capsule 0   amLODipine (NORVASC) 5 MG tablet Take 1 tablet (5 mg total) by mouth daily. (Patient not taking: Reported on 04/19/2021) 30 tablet 3   LORazepam (ATIVAN) 2 MG tablet Take 1 tablet (2 mg total) by mouth every 8 (eight) hours as needed for seizure. (Patient not taking: Reported on 04/19/2021) 12 tablet 0   losartan (COZAAR) 100 MG tablet TAKE 1 TABLET BY MOUTH EVERY DAY (Patient not taking: Reported on 04/19/2021) 90 tablet 0   omeprazole (PRILOSEC) 20 MG capsule Take 1 capsule (20 mg total) by mouth daily. Take while on Dexamethasone. (Patient not taking: Reported on 04/19/2021) 30 capsule 1   ondansetron (ZOFRAN) 8 MG tablet Take 1 tablet by mouth twice daily as needed for nausea & vomiting. May take 30-60 minutes prior to Temodar administration if nausea/vomiting occurs. (Patient not taking: Reported on 04/19/2021) 30 tablet 1   potassium chloride 20 MEQ TBCR Take 20 mEq by mouth daily. 90 tablet 1   No current facility-administered medications on file prior to visit.    BP 128/80 (BP Location: Right Arm, Patient Position: Sitting, Cuff Size: Large)    Pulse 74    Temp 98.5 F (36.9 C) (Oral)    Wt 190 lb (86.2 kg)    SpO2 97%    BMI 28.89 kg/m       Objective:   Physical Exam Vitals and nursing note reviewed.  Constitutional:      Appearance: Normal appearance.  Skin:    General: Skin is warm and dry.  Neurological:     General: No focal deficit present.     Mental Status: She is alert and oriented to person, place, and time.  Psychiatric:        Mood and Affect:  Mood normal.        Behavior: Behavior normal.        Thought Content: Thought content normal.        Judgment: Judgment normal.      Assessment & Plan:  1. Hypokalemia -Hopefully now that she is no longer taking HCTZ her potassium level will be closer to normal limits.  Can consider adding additional potassium if needed. - Basic Metabolic Panel; Future - Basic Metabolic Panel  [name], NP

## 2021-04-25 ENCOUNTER — Encounter: Payer: Self-pay | Admitting: Adult Health

## 2021-04-25 ENCOUNTER — Inpatient Hospital Stay: Payer: BC Managed Care – PPO

## 2021-04-25 ENCOUNTER — Other Ambulatory Visit: Payer: Self-pay

## 2021-04-25 DIAGNOSIS — Z800 Family history of malignant neoplasm of digestive organs: Secondary | ICD-10-CM | POA: Diagnosis not present

## 2021-04-25 DIAGNOSIS — I10 Essential (primary) hypertension: Secondary | ICD-10-CM | POA: Diagnosis not present

## 2021-04-25 DIAGNOSIS — C719 Malignant neoplasm of brain, unspecified: Secondary | ICD-10-CM

## 2021-04-25 DIAGNOSIS — Z90710 Acquired absence of both cervix and uterus: Secondary | ICD-10-CM | POA: Diagnosis not present

## 2021-04-25 DIAGNOSIS — E876 Hypokalemia: Secondary | ICD-10-CM | POA: Diagnosis not present

## 2021-04-25 DIAGNOSIS — Z801 Family history of malignant neoplasm of trachea, bronchus and lung: Secondary | ICD-10-CM | POA: Diagnosis not present

## 2021-04-25 LAB — CMP (CANCER CENTER ONLY)
ALT: 15 U/L (ref 0–44)
AST: 10 U/L — ABNORMAL LOW (ref 15–41)
Albumin: 3.9 g/dL (ref 3.5–5.0)
Alkaline Phosphatase: 88 U/L (ref 38–126)
Anion gap: 7 (ref 5–15)
BUN: 11 mg/dL (ref 6–20)
CO2: 29 mmol/L (ref 22–32)
Calcium: 9.3 mg/dL (ref 8.9–10.3)
Chloride: 105 mmol/L (ref 98–111)
Creatinine: 0.9 mg/dL (ref 0.44–1.00)
GFR, Estimated: >60 mL/min (ref >60)
Glucose, Bld: 184 mg/dL — ABNORMAL HIGH (ref 70–99)
Potassium: 3.4 mmol/L — ABNORMAL LOW (ref 3.5–5.1)
Sodium: 141 mmol/L (ref 135–145)
Total Bilirubin: 0.7 mg/dL (ref 0.3–1.2)
Total Protein: 6.2 g/dL — ABNORMAL LOW (ref 6.5–8.1)

## 2021-04-25 LAB — CBC WITH DIFFERENTIAL (CANCER CENTER ONLY)
Abs Immature Granulocytes: 0.02 10*3/uL (ref 0.00–0.07)
Basophils Absolute: 0 10*3/uL (ref 0.0–0.1)
Basophils Relative: 0 %
Eosinophils Absolute: 0.1 10*3/uL (ref 0.0–0.5)
Eosinophils Relative: 1 %
HCT: 32.2 % — ABNORMAL LOW (ref 36.0–46.0)
Hemoglobin: 11.2 g/dL — ABNORMAL LOW (ref 12.0–15.0)
Immature Granulocytes: 0 %
Lymphocytes Relative: 28 %
Lymphs Abs: 1.8 10*3/uL (ref 0.7–4.0)
MCH: 30.9 pg (ref 26.0–34.0)
MCHC: 34.8 g/dL (ref 30.0–36.0)
MCV: 88.7 fL (ref 80.0–100.0)
Monocytes Absolute: 0.4 10*3/uL (ref 0.1–1.0)
Monocytes Relative: 7 %
Neutro Abs: 4 10*3/uL (ref 1.7–7.7)
Neutrophils Relative %: 64 %
Platelet Count: 180 10*3/uL (ref 150–400)
RBC: 3.63 MIL/uL — ABNORMAL LOW (ref 3.87–5.11)
RDW: 15 % (ref 11.5–15.5)
WBC Count: 6.4 10*3/uL (ref 4.0–10.5)
nRBC: 0 % (ref 0.0–0.2)

## 2021-04-26 ENCOUNTER — Inpatient Hospital Stay (HOSPITAL_BASED_OUTPATIENT_CLINIC_OR_DEPARTMENT_OTHER): Payer: BC Managed Care – PPO | Admitting: Internal Medicine

## 2021-04-26 DIAGNOSIS — R569 Unspecified convulsions: Secondary | ICD-10-CM

## 2021-04-26 DIAGNOSIS — C719 Malignant neoplasm of brain, unspecified: Secondary | ICD-10-CM

## 2021-04-26 NOTE — Progress Notes (Signed)
I connected with [name] on 04/26/21 at  2:00 PM EST by telephone visit and verified that I am speaking with the correct person using two identifiers.  I discussed the limitations, risks, security and privacy concerns of performing an evaluation and management service by telemedicine and the availability of in-person appointments. I also discussed with the patient that there may be a patient responsible charge related to this service. The patient expressed understanding and agreed to proceed.  Other persons participating in the visit and their role in the encounter:  n/a  Patient's location:  Home  Provider's location:  Office  Chief Complaint:  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[name])  Focal seizures ([name])  History of Present Ilness: [name] describes no new or progressive changes.  Potassium level has improved per recent lab work.  She still needs to pick up her chemo cycle at the pharmacy. Observations: Language and cognition at baseline  CMP Latest Ref Rng & Units 04/25/2021 04/24/2021 04/19/2021  Glucose 70 - 99 mg/dL 184(H) 190(H) 131(H)  BUN 6 - 20 mg/dL 11 12 18   Creatinine 0.44 - 1.00 mg/dL 0.90 0.89 1.05(H)  Sodium 135 - 145 mmol/L 141 140 142  Potassium 3.5 - 5.1 mmol/L 3.4(L) 3.1(L) 2.8(L)  Chloride 98 - 111 mmol/L 105 103 100  CO2 22 - 32 mmol/L 29 30 31   Calcium 8.9 - 10.3 mg/dL 9.3 9.5 9.6  Total Protein 6.5 - 8.1 g/dL 6.2(L) - 6.8  Total Bilirubin 0.3 - 1.2 mg/dL 0.7 - 0.7  Alkaline Phos 38 - 126 U/L 88 - 98  AST 15 - 41 U/L 10(L) - 10(L)  ALT 0 - 44 U/L 15 - 14    Assessment and Plan: 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[name])  Focal seizures ([name])  Clinically stable.  Potassium level back to near normal with cessation of HCTZ.  She will dose cycle #2 starting 12/26, MRI ordered for 1/20  Follow Up Instructions: RTC in 1 month  I discussed the assessment and treatment plan with the patient.  The patient  was provided an opportunity to ask questions and all were answered.  The patient agreed with the plan and demonstrated understanding of the instructions.    The patient was advised to call back or seek an in-person evaluation if the symptoms worsen or if the condition fails to improve as anticipated.  I provided 5-10 minutes of non-face-to-face time during this enocunter.  [name] Sellers, MD   I provided 15 minutes of non face-to-face telephone visit time during this encounter, and > 50% was spent counseling as documented under my assessment & plan.

## 2021-04-27 ENCOUNTER — Encounter: Payer: Self-pay | Admitting: Internal Medicine

## 2021-05-03 ENCOUNTER — Other Ambulatory Visit: Payer: Self-pay | Admitting: Adult Health

## 2021-05-03 DIAGNOSIS — E039 Hypothyroidism, unspecified: Secondary | ICD-10-CM

## 2021-05-03 NOTE — Telephone Encounter (Signed)
Last lipid panel 01/25/20. Last OV 04/24/21. Verbal order rec'd from [facility] ok to refill

## 2021-05-08 ENCOUNTER — Encounter: Payer: Self-pay | Admitting: Internal Medicine

## 2021-05-11 ENCOUNTER — Other Ambulatory Visit: Payer: Self-pay | Admitting: Internal Medicine

## 2021-05-11 DIAGNOSIS — C719 Malignant neoplasm of brain, unspecified: Secondary | ICD-10-CM

## 2021-05-14 ENCOUNTER — Other Ambulatory Visit: Payer: Self-pay | Admitting: Adult Health

## 2021-05-14 DIAGNOSIS — I10 Essential (primary) hypertension: Secondary | ICD-10-CM

## 2021-05-15 ENCOUNTER — Telehealth: Payer: Self-pay | Admitting: Internal Medicine

## 2021-05-15 NOTE — Telephone Encounter (Signed)
Scheduled per 1/9 sch msg, pt has been called and confirmed appt

## 2021-05-17 ENCOUNTER — Other Ambulatory Visit: Payer: Self-pay | Admitting: Radiation Therapy

## 2021-05-24 ENCOUNTER — Other Ambulatory Visit: Payer: Self-pay | Admitting: Internal Medicine

## 2021-05-24 DIAGNOSIS — K8689 Other specified diseases of pancreas: Secondary | ICD-10-CM

## 2021-05-25 ENCOUNTER — Other Ambulatory Visit: Payer: Self-pay

## 2021-05-25 ENCOUNTER — Ambulatory Visit (HOSPITAL_COMMUNITY)
Admission: RE | Admit: 2021-05-25 | Discharge: 2021-05-25 | Disposition: A | Discharge status: Home / Self Care | Payer: BC Managed Care – PPO | Source: Ambulatory Visit | Attending: Internal Medicine | Admitting: Internal Medicine

## 2021-05-25 DIAGNOSIS — K8689 Other specified diseases of pancreas: Secondary | ICD-10-CM | POA: Diagnosis not present

## 2021-05-25 DIAGNOSIS — C719 Malignant neoplasm of brain, unspecified: Secondary | ICD-10-CM | POA: Diagnosis not present

## 2021-05-25 DIAGNOSIS — I700 Atherosclerosis of aorta: Secondary | ICD-10-CM | POA: Diagnosis not present

## 2021-05-25 DIAGNOSIS — G9389 Other specified disorders of brain: Secondary | ICD-10-CM | POA: Diagnosis not present

## 2021-05-25 MED ORDER — SODIUM CHLORIDE (PF) 0.9 % IJ SOLN
INTRAMUSCULAR | Status: AC
  Filled 2021-05-25: qty 50

## 2021-05-25 MED ORDER — IOHEXOL 300 MG/ML  SOLN
100.0000 mL | Freq: Once | INTRAMUSCULAR | Status: AC | PRN
  Administered 2021-05-25: 100 mL via INTRAVENOUS

## 2021-05-25 MED ORDER — GADOBUTROL 1 MMOL/ML IV SOLN
8.0000 mL | Freq: Once | INTRAVENOUS | Status: AC | PRN
  Administered 2021-05-25: 8 mL via INTRAVENOUS

## 2021-05-28 ENCOUNTER — Other Ambulatory Visit: Payer: Self-pay | Admitting: *Deleted

## 2021-05-28 ENCOUNTER — Other Ambulatory Visit: Payer: Self-pay

## 2021-05-28 ENCOUNTER — Inpatient Hospital Stay: Payer: BC Managed Care – PPO | Attending: Internal Medicine

## 2021-05-28 DIAGNOSIS — Z90710 Acquired absence of both cervix and uterus: Secondary | ICD-10-CM | POA: Insufficient documentation

## 2021-05-28 DIAGNOSIS — Z801 Family history of malignant neoplasm of trachea, bronchus and lung: Secondary | ICD-10-CM | POA: Insufficient documentation

## 2021-05-28 DIAGNOSIS — Z800 Family history of malignant neoplasm of digestive organs: Secondary | ICD-10-CM | POA: Insufficient documentation

## 2021-05-28 DIAGNOSIS — I10 Essential (primary) hypertension: Secondary | ICD-10-CM | POA: Insufficient documentation

## 2021-05-28 DIAGNOSIS — C712 Malignant neoplasm of temporal lobe: Secondary | ICD-10-CM | POA: Insufficient documentation

## 2021-05-28 DIAGNOSIS — K8689 Other specified diseases of pancreas: Secondary | ICD-10-CM

## 2021-05-29 ENCOUNTER — Inpatient Hospital Stay: Payer: BC Managed Care – PPO

## 2021-05-29 ENCOUNTER — Inpatient Hospital Stay (HOSPITAL_BASED_OUTPATIENT_CLINIC_OR_DEPARTMENT_OTHER): Payer: BC Managed Care – PPO | Admitting: Internal Medicine

## 2021-05-29 ENCOUNTER — Other Ambulatory Visit: Payer: Self-pay

## 2021-05-29 VITALS — BP 152/103 | HR 104 | Temp 97.9°F | Resp 18 | Wt 192.0 lb

## 2021-05-29 DIAGNOSIS — Z801 Family history of malignant neoplasm of trachea, bronchus and lung: Secondary | ICD-10-CM | POA: Diagnosis not present

## 2021-05-29 DIAGNOSIS — R569 Unspecified convulsions: Secondary | ICD-10-CM | POA: Diagnosis not present

## 2021-05-29 DIAGNOSIS — I10 Essential (primary) hypertension: Secondary | ICD-10-CM | POA: Diagnosis not present

## 2021-05-29 DIAGNOSIS — C719 Malignant neoplasm of brain, unspecified: Secondary | ICD-10-CM

## 2021-05-29 DIAGNOSIS — Z800 Family history of malignant neoplasm of digestive organs: Secondary | ICD-10-CM | POA: Diagnosis not present

## 2021-05-29 DIAGNOSIS — Z90710 Acquired absence of both cervix and uterus: Secondary | ICD-10-CM | POA: Diagnosis not present

## 2021-05-29 DIAGNOSIS — C712 Malignant neoplasm of temporal lobe: Secondary | ICD-10-CM | POA: Diagnosis not present

## 2021-05-29 LAB — CBC WITH DIFFERENTIAL (CANCER CENTER ONLY)
Abs Immature Granulocytes: 0.01 10*3/uL (ref 0.00–0.07)
Basophils Absolute: 0 10*3/uL (ref 0.0–0.1)
Basophils Relative: 1 %
Eosinophils Absolute: 0.1 10*3/uL (ref 0.0–0.5)
Eosinophils Relative: 1 %
HCT: 41.3 % (ref 36.0–46.0)
Hemoglobin: 14 g/dL (ref 12.0–15.0)
Immature Granulocytes: 0 %
Lymphocytes Relative: 21 %
Lymphs Abs: 1.1 10*3/uL (ref 0.7–4.0)
MCH: 31.6 pg (ref 26.0–34.0)
MCHC: 33.9 g/dL (ref 30.0–36.0)
MCV: 93.2 fL (ref 80.0–100.0)
Monocytes Absolute: 0.5 10*3/uL (ref 0.1–1.0)
Monocytes Relative: 11 %
Neutro Abs: 3.4 10*3/uL (ref 1.7–7.7)
Neutrophils Relative %: 66 %
Platelet Count: 174 10*3/uL (ref 150–400)
RBC: 4.43 MIL/uL (ref 3.87–5.11)
RDW: 13.4 % (ref 11.5–15.5)
WBC Count: 5.1 10*3/uL (ref 4.0–10.5)
nRBC: 0 % (ref 0.0–0.2)

## 2021-05-29 LAB — CMP (CANCER CENTER ONLY)
ALT: 28 U/L (ref 0–44)
AST: 17 U/L (ref 15–41)
Albumin: 4.4 g/dL (ref 3.5–5.0)
Alkaline Phosphatase: 104 U/L (ref 38–126)
Anion gap: 9 (ref 5–15)
BUN: 14 mg/dL (ref 6–20)
CO2: 28 mmol/L (ref 22–32)
Calcium: 10.5 mg/dL — ABNORMAL HIGH (ref 8.9–10.3)
Chloride: 103 mmol/L (ref 98–111)
Creatinine: 1.12 mg/dL — ABNORMAL HIGH (ref 0.44–1.00)
GFR, Estimated: 59 mL/min — ABNORMAL LOW (ref >60)
Glucose, Bld: 149 mg/dL — ABNORMAL HIGH (ref 70–99)
Potassium: 3.6 mmol/L (ref 3.5–5.1)
Sodium: 140 mmol/L (ref 135–145)
Total Bilirubin: 2.3 mg/dL — ABNORMAL HIGH (ref 0.3–1.2)
Total Protein: 7.4 g/dL (ref 6.5–8.1)

## 2021-05-29 MED ORDER — AMITRIPTYLINE HCL 50 MG PO TABS: 50.0000 mg | ORAL_TABLET | Freq: Every day | ORAL | 1 refills | Status: DC

## 2021-05-29 MED ORDER — TEMOZOLOMIDE 100 MG PO CAPS: 150.0000 mg/m2/d | ORAL_CAPSULE | Freq: Every day | ORAL | 0 refills | Status: DC

## 2021-05-29 MED ORDER — BACLOFEN 5 MG PO TABS: 5.0000 mg | ORAL_TABLET | Freq: Three times a day (TID) | ORAL | 1 refills | Status: DC | PRN

## 2021-05-29 NOTE — Progress Notes (Signed)
[facility] [place], [place] [address] [phone]   Interval Evaluation  Date of Service: 05/29/21 Patient Name: [name] Patient MRN: [id number] Patient DOB: 19-Aug-1965 Provider: Ventura Sellers, MD  Identifying Statement:  [name] is a 56 y.o. female with left temporal glioblastoma   Oncologic History: Oncology History  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[facility])  11/21/2020 Initial Diagnosis   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[facility])   11/21/2020 Cancer Staging   Staging form: Brain and Spinal Cord, AJCC 8th Edition - Pathologic stage from 11/21/2020: WHO Grade IV - Signed by Ventura Sellers, MD on 11/30/2020 Histopathologic type: Glioblastoma Stage prefix: Initial diagnosis Histologic grading system: 4 grade system Extent of surgical resection: Subtotal resection Karnofsky performance status: Score 90 Seizures at presentation: Present Duration of symptoms before diagnosis: Short IDH1 mutation: Negative    12/27/2020 - 02/07/2021 Radiation Therapy   IMRT and concurrent Temodar [name])   03/12/2021 -  Chemotherapy   Patient is on Treatment Plan : BRAIN GLIOBLASTOMA Consolidation Temozolomide Days 1-5 q28 Days        Biomarkers:  MGMT Methylated.  IDH 1/2 Wild type.  EGFR Unknown  TERT Unknown   Interval History: [name] presents today for follow up with MRI brain for evaluation following cycle #2 of Temodar.  Cramping has worsened from prior visit.  Nausea was tolerable during chemo this month.  Today she describes daily headaches and poor sleep.  Short term memory impairment and fatigue are still persistent.       H+P (11/25/20) Patient presented to medical attention this past month with first ever seizure.  She describes episode of sudden onset "disconnection" with tongue biting and urination, followed by period of confusion.  CNS imaging  demonstrated enhancing mass within left anterior temporal lobe, which was mostly resected by Dr. Zada Finders on 11/21/20.  Since surgery she had not had recurrence of seizures.  She complains of painful cramping of her right leg, which started ~1 week prior.  At this time the cramping is her main complaint.  Otherwise fully functional, independent, would like to return to work if possible.  Medications: Current Outpatient Medications on File Prior to Visit  Medication Sig Dispense Refill   amLODipine (NORVASC) 5 MG tablet Take 1 tablet (5 mg total) by mouth daily. (Patient not taking: Reported on 04/19/2021) 30 tablet 3   atorvastatin (LIPITOR) 20 MG tablet Take 1 tablet (20 mg total) by mouth daily. 90 tablet 1   [facility] D3 125 MCG (5000 UT) capsule TAKE 1 CAPSULE (5,000 UNITS TOTAL) BY MOUTH DAILY. 90 capsule 3   dexamethasone (DECADRON) 1 MG tablet Take 1 tablet (1 mg total) by mouth daily. 30 tablet 1   lacosamide (VIMPAT) 50 MG TABS tablet Take 1 tablet (50 mg total) by mouth 2 (two) times daily. 60 tablet 3   levETIRAcetam (KEPPRA) 750 MG tablet TAKE 2 TABLETS (1,500 MG TOTAL) BY MOUTH 2 (TWO) TIMES DAILY. 360 tablet 1   levothyroxine (SYNTHROID) 100 MCG tablet TAKE 1 TABLET BY MOUTH EVERY DAY 90 tablet 3   LORazepam (ATIVAN) 2 MG tablet Take 1 tablet (2 mg total) by mouth every 8 (eight) hours as needed for seizure. (Patient not taking: Reported on 04/19/2021) 12 tablet 0   omeprazole (PRILOSEC) 20 MG capsule Take 1 capsule (20 mg total) by mouth daily. Take while on Dexamethasone. (Patient not taking: Reported on 04/19/2021) 30 capsule  1   ondansetron (ZOFRAN) 8 MG tablet Take 1 tablet by mouth twice daily as needed for nausea & vomiting. May take 30-60 minutes prior to Temodar administration if nausea/vomiting occurs. (Patient not taking: Reported on 04/19/2021) 30 tablet 1   ondansetron (ZOFRAN) 8 MG tablet Take 1 tablet (8 mg total) by mouth every 8 (eight) hours as needed (nausea and vomiting).  30 tablet 1   potassium chloride 20 MEQ TBCR Take 20 mEq by mouth daily. 90 tablet 1   temozolomide (TEMODAR) 100 MG capsule Take 3 capsules (300 mg total) by mouth daily. Take for 5 days on, 23 days off. Repeat every 28 days. May take on an empty stomach to decrease nausea & vomiting. 15 capsule 0   temozolomide (TEMODAR) 100 MG capsule Take 3 capsules (300 mg total) by mouth daily. Take for 5 days on, 23 days off. Repeat every 28 days. May take on an empty stomach to decrease nausea & vomiting. 15 capsule 0   No current facility-administered medications on file prior to visit.    Allergies: No Known Allergies Past Medical History:  Past Medical History:  Diagnosis Date   Bell's palsy    left side of face- notied in smile and eyes, if very tired   Brain cancer ([facility])    Complication of anesthesia    N/V   Essential hypertension    External hemorrhoids    History of blood transfusion    post op Hemorroids   History of pneumonia    Hyperlipidemia    Hyperthyroidism    hx 56 years old- oral meds   Iron deficiency anemia    Migraine headache    Pneumonia    x 2 - years ago   PONV (postoperative nausea and vomiting)    PPD positive    Pulmonary nodules    not seen on plain cxr, first detected 1/07 re ct 10/08 pos IPPD > 15 mm 12/09 FOB/lavage 04/20/08 neg afb smear   PVC's (premature ventricular contractions)    Seizure ([facility]) 11/15/2020   Sleep apnea    refused CPAP- mouth piece recommended   Tuberculosis    in her late 30's   Past Surgical History:  Past Surgical History:  Procedure Laterality Date   ABDOMINAL HYSTERECTOMY     APPLICATION OF CRANIAL NAVIGATION N/A 11/21/2020   Procedure: APPLICATION OF CRANIAL NAVIGATION;  Surgeon: Judith Part, MD;  Location: [facility];  Service: Neurosurgery;  Laterality: N/A;   BREAST BIOPSY Right    CHOLECYSTECTOMY  2014   COLONOSCOPY  05/04/2009   prolapsing external hemorrhoids   CRANIOTOMY Left 11/21/2020   Procedure: Left  craniotomy for tumor resection;  Surgeon: Judith Part, MD;  Location: [facility];  Service: Neurosurgery;  Laterality: Left;   ENDOMETRIAL ABLATION     HEMORRHOID SURGERY     x 3   TUBAL LIGATION     Social History:  Social History   Socioeconomic History   Marital status: Divorced    Spouse name: Not on file   Number of children: 2   Years of education: Not on file   Highest education level: Associate degree: occupational, Hotel manager, or vocational program  Occupational History   Occupation: accounts Sport and exercise psychologist: [employer]  Tobacco Use   Smoking status: Never   Smokeless tobacco: Never  Vaping Use   Vaping Use: Never used  Substance and Sexual Activity   Alcohol use: No    Alcohol/week: 0.0 standard drinks  Drug use: No   Sexual activity: Not on file  Other Topics Concern   Not on file  Social History Narrative   Divorced with children   She works in Press photographer    Possible TB exposure 2008 (after nodules found)   From [place] lived there and [place]   Never owned birds      Social Determinants of Radio broadcast assistant Strain: Medium Risk   Difficulty of Paying Living Expenses: Somewhat hard  Food Insecurity: Landscape architect Present   Worried About Charity fundraiser in [place]: Sometimes true   Arboriculturist in the Last Year: Never true  Transportation Needs: No Transportation Needs   Lack of Transportation (Medical): No   Lack of Transportation (Non-Medical): No  Physical Activity: Unknown   Days of Exercise per Week: 0 days   Minutes of Exercise per Session: Not on file  Stress: No Stress Concern Present   Feeling of Stress : Only a little  Social Connections: Moderately Integrated   Frequency of Communication with Friends and Family: Twice a week   Frequency of Social Gatherings with Friends and Family: Twice a week   Attends Religious Services: More than 4 times per year   Active Member of [employer] or Organizations: Yes   Attends Programme researcher, broadcasting/film/video: More than 4 times per year   Marital Status: Divorced  Human resources officer Violence: Not on file   Family History:  Family History  Problem Relation Age of Onset   Heart disease Maternal Grandmother        aunts, uncles, sister   Diabetes Mellitus II Maternal Grandmother    Kidney failure Sister    Diabetes Mellitus II Sister    Lung cancer Other        aunts and uncles   Colon cancer Maternal Aunt        aunts and uncles   Diabetes Mellitus II Maternal Aunt    Colon cancer Maternal Uncle    Diabetes Mellitus II Maternal Uncle    Diabetes type II Mother     Review of Systems: Constitutional: Doesn't report fevers, chills or abnormal weight loss Eyes: Doesn't report blurriness of vision Ears, nose, mouth, throat, and face: Doesn't report sore throat Respiratory: Doesn't report cough, dyspnea or wheezes Cardiovascular: Doesn't report palpitation, chest discomfort  Gastrointestinal:  Doesn't report nausea, constipation, diarrhea GU: Doesn't report incontinence Skin: Doesn't report skin rashes Neurological: Per HPI Musculoskeletal: Doesn't report joint pain Behavioral/Psych: Doesn't report anxiety  Physical Exam: Vitals with BMI 04/24/2021 04/19/2021 04/10/2021  Height - - -  Weight 190 lbs 184 lbs 14 oz -  BMI 96.7 59.16 -  Systolic 384 665 993  Diastolic 80 85 71  Pulse 74 81 76    KPS: 90. General: Alert, cooperative, pleasant, in no acute distress Head: Normal EENT: No conjunctival injection or scleral icterus.  Lungs: Resp effort normal Cardiac: Regular rate Abdomen: Non-distended abdomen Skin: No rashes cyanosis or petechiae. Extremities: No clubbing or edema  Neurologic Exam: Mental Status: Awake, alert, attentive to examiner. Oriented to self and environment. Language is fluent with intact comprehension.  Cranial Nerves: Visual acuity is grossly normal. Visual fields are full. Extra-ocular movements intact. No ptosis. Face L LMN paresis,  chronic. Motor: Tone and bulk are normal. Power is full in both arms and legs. Reflexes are symmetric, no pathologic reflexes present.  Sensory: Intact to light touch Gait: Normal.   Labs: I have reviewed the data  as listed    Component Value Date/Time   NA 141 04/25/2021 1142   K 3.4 (L) 04/25/2021 1142   CL 105 04/25/2021 1142   CO2 29 04/25/2021 1142   GLUCOSE 184 (H) 04/25/2021 1142   BUN 11 04/25/2021 1142   CREATININE 0.90 04/25/2021 1142   CREATININE 0.92 01/25/2020 1424   CALCIUM 9.3 04/25/2021 1142   PROT 6.2 (L) 04/25/2021 1142   ALBUMIN 3.9 04/25/2021 1142   AST 10 (L) 04/25/2021 1142   ALT 15 04/25/2021 1142   ALKPHOS 88 04/25/2021 1142   BILITOT 0.7 04/25/2021 1142   GFRNONAA >60 04/25/2021 1142   GFRNONAA 72 01/25/2020 1424   GFRAA 83 01/25/2020 1424   Lab Results  Component Value Date   WBC 6.4 04/25/2021   NEUTROABS 4.0 04/25/2021   HGB 11.2 (L) 04/25/2021   HCT 32.2 (L) 04/25/2021   MCV 88.7 04/25/2021   PLT 180 04/25/2021   Imaging:  [facility] Clinician Interpretation: I have personally reviewed the CNS images as listed.  My interpretation, in the context of the patient's clinical presentation, is treatment effect vs true progression  MR BRAIN W WO CONTRAST  Result Date: 05/25/2021 CLINICAL DATA:  Brain/CNS neoplasm, assess treatment response EXAM: MRI HEAD WITHOUT AND WITH CONTRAST TECHNIQUE: Multiplanar, multiecho pulse sequences of the brain and surrounding structures were obtained without and with intravenous contrast. CONTRAST:  79m GADAVIST GADOBUTROL 1 MMOL/ML IV SOLN COMPARISON:  03/02/2021 FINDINGS: Brain: Stable appearance of postoperative changes in the left temporal lobe. There is no new enhancement about the resection cavity. Adjacent T2 FLAIR hyperintensity is stable. However, increase in size of irregular enhancement deep to the posterior insula and temporal operculum, which is now coalesced into a single lesion measuring 1.6 x 1.9 cm  (previously clustered enhancement measured up to 1.2 cm). Extent of T2 FLAIR hyperintensity in this region has increased with involvement of the white matter tracts about the lentiform nucleus as well as the periatrial white matter posteriorly. No acute infarction or hemorrhage. No significant mass effect. No hydrocephalus. Vascular: Major vessel flow voids at the skull base are preserved. Skull and upper cervical spine: Normal marrow signal is preserved. Left pterional craniotomy. Sinuses/Orbits: Paranasal sinuses are aerated. Orbits are unremarkable. Other: Sella is unremarkable.  Mastoid air cells are clear. IMPRESSION: Increased irregular enhancement deep to the posterior left insula and temporal operculum with increased surrounding T2 FLAIR hyperintensity. Stable appearance of resection site. Electronically Signed   By: PMacy MisM.D.   On: 05/25/2021 11:50   CT Abdomen Pelvis W Contrast  Result Date: 05/25/2021 CLINICAL DATA:  Pancreatic mass EXAM: CT ABDOMEN AND PELVIS WITH CONTRAST TECHNIQUE: Multidetector CT imaging of the abdomen and pelvis was performed using the standard protocol following bolus administration of intravenous contrast. RADIATION DOSE REDUCTION: This exam was performed according to the departmental dose-optimization program which includes automated exposure control, adjustment of the mA and/or kV according to patient size and/or use of iterative reconstruction technique. CONTRAST:  1051mOMNIPAQUE IOHEXOL 300 MG/ML  SOLN COMPARISON:  MRI abdomen dated November 16, 2020; CT chest abdomen and pelvis dated November 15, 2020 FINDINGS: Lower chest: Bibasilar atelectasis. Stable small solid right lobe pulmonary nodule measuring 4 mm on series 2, image 160. Hepatobiliary: No focal liver abnormality is seen. Status post cholecystectomy. No biliary dilatation. Pancreas: Lesion of the pancreatic head/uncinate process measuring 1.6 x 1.4 cm on series 4, image 60, previously measured 1.3 x 1.2 cm on  prior CT, and demonstrates relative hypoenhancement  compared with the pancreatic parenchyma. Less than 180 degree abutment of the SMV. No involvement the celiac or SMA. No pancreatic ductal dilation. Spleen: Normal in size without focal abnormality. Adrenals/Urinary Tract: Adrenal glands are unremarkable. Kidneys are normal, without renal calculi, focal lesion, or hydronephrosis. Bladder is unremarkable. Stomach/Bowel: Stomach is within normal limits. Appendix appears normal. No evidence of bowel wall thickening, distention, or inflammatory changes. Vascular/Lymphatic: Aortic atherosclerosis. No enlarged abdominal or pelvic lymph nodes. Reproductive: No adnexal masses. Other: No abdominal wall hernia or abnormality. No abdominopelvic ascites. Musculoskeletal: No acute or significant osseous findings. IMPRESSION: 1. Lesion of the pancreatic head/incident is increased in size when compared with prior CT, concerning for pancreatic adenocarcinoma. 2. No evidence of metastatic disease in the abdomen or pelvis. Electronically Signed   By: Yetta Glassman M.D.   On: 05/25/2021 15:58    Assessment/Plan Glioblastoma with isocitrate dehydrogenase gene wildtype [facility]) [C71.9]  [name] is clinically stable today, now having completed cycle #2 of 5-day Temodar.  Brain MRI demonstrates progression within left temporal lobe, posterior to original resection cavity but within high dose IMRT field.  Etiology is either organic progression of disease or pseudoprogression.  Case was discussed in brain/spine tumor board; recommended short term surveillance, 1 month brain MRI with perfusion protocol.  Today we also reviewed her follow up abdominal CT imaging.  The cyst or mass within panreatic head has grown.  Per discussion with radiology, etiology of this lesion is unclear, and more advanced imaging or tissue sampling may be warranted.  We will coordinate referral to GI malignancy specialist here in the cancer center for  further workup; she is agreeable with this.    We recommended continuing treatment with cycle #3 Temozolomide, still 150 mg/m2, on for five days and off for twenty three days in twenty eight day cycles. The patient will have a complete blood count performed on days 21 and 28 of each cycle, and a comprehensive metabolic panel performed on day 28 of each cycle. Labs may need to be performed more often. Zofran will prescribed for home use for nausea/vomiting.   Chemotherapy should be held for the following:  ANC less than 1,000  Platelets less than 100,000  LFT or creatinine greater than 2x ULN  If clinical concerns/contraindications develop  For seizures, will con't Vimpat 29m BID and Keppra 15070mBID.  Will initiate trial of Elavil 5070mS given headache issues, poor sleep.  Also ok with trial of baclofen 5mg55mD PRN for spasticity.  Can remain off dexamethasone due to insomnia, tolerability issues.  We also discussed and patient consented for additional tumor profiling and sequencing through [facility] tumor profiling could help identify actionable mutation for targeted therapy and lead to direct clinical benefit.     Given changes observed today, we ask that [name] CARNEROurn to clinic in 1 month with perfusion protocol brain MRI for review, prior to cycle #4.  All questions were answered. The patient knows to call the clinic with any problems, questions or concerns. No barriers to learning were detected.  The total time spent in the encounter was 40 minutes and more than 50% was on counseling and review of test results   ZachVentura Sellers Medical Director of Neuro-Oncology [facility] LLCWeslBurley24/23 9:24 AM

## 2021-05-30 NOTE — Progress Notes (Signed)
Faxed signed Tumor Profiling Requisition and accompanying chart information to [facility]. FAX confirmation received Job# [id number]

## 2021-05-31 ENCOUNTER — Encounter: Payer: Self-pay | Admitting: *Deleted

## 2021-06-01 ENCOUNTER — Encounter: Payer: Self-pay | Admitting: Internal Medicine

## 2021-06-01 ENCOUNTER — Telehealth: Payer: Self-pay | Admitting: Pharmacist

## 2021-06-01 ENCOUNTER — Other Ambulatory Visit (HOSPITAL_COMMUNITY): Payer: Self-pay

## 2021-06-01 ENCOUNTER — Other Ambulatory Visit: Payer: Self-pay | Admitting: Internal Medicine

## 2021-06-01 DIAGNOSIS — C719 Malignant neoplasm of brain, unspecified: Secondary | ICD-10-CM

## 2021-06-01 MED ORDER — TEMOZOLOMIDE 100 MG PO CAPS: 150.0000 mg/m2/d | ORAL_CAPSULE | Freq: Every day | ORAL | 0 refills | Status: DC

## 2021-06-01 NOTE — Telephone Encounter (Addendum)
Oral Chemotherapy Pharmacist Encounter   Spoke with patient today to follow up regarding patient's oral chemotherapy medication: Temodar (temozolomide)  Discussed with patient that the quoted cost [facility] ~$1400 is due to needing to meet an out of pocket deductible for her insurance.   This is unaffordable for patient and she would like to explore other options for obtaining medication. We discussed both options of utilizing [facility] or attempting to obtain medication through mail order [facility] which carries temozolomide 100 mg at a discounted price.  Patient would like to pursue trying to obtain medication through [facility]. Prescription has been redirected to [facility] for dispensing and phone number to pharmacy [phone]) provided to patient to follow up regarding setting up fill.   Addendum 06/01/2021 Followed up with patient and she said [facility] stated they can have the Temodar in Monday. Estimated quoted cost was $102.12. Patient OK with this cost at this time. No further questions at this time.   Leron Croak, PharmD, BCPS Hematology/Oncology Clinical Pharmacist [name] [phone] 06/01/2021 1:03 PM

## 2021-06-04 ENCOUNTER — Other Ambulatory Visit: Payer: Self-pay | Admitting: Radiation Therapy

## 2021-06-04 ENCOUNTER — Encounter: Payer: Self-pay | Admitting: Internal Medicine

## 2021-06-06 ENCOUNTER — Telehealth: Payer: Self-pay | Admitting: Nurse Practitioner

## 2021-06-06 NOTE — Telephone Encounter (Signed)
Scheduled appt per 1/23 referral. Spoke to pt who is aware of appt date and time.

## 2021-06-07 ENCOUNTER — Other Ambulatory Visit: Payer: Self-pay | Admitting: Internal Medicine

## 2021-06-09 ENCOUNTER — Other Ambulatory Visit: Payer: Self-pay | Admitting: Adult Health

## 2021-06-09 DIAGNOSIS — I10 Essential (primary) hypertension: Secondary | ICD-10-CM

## 2021-06-10 NOTE — Progress Notes (Addendum)
[facility]   [phone] Fax:([phone] [place] Note   Patient Care Team: [name], NP as PCP - General (Family Medicine) Jacki Cones, MD as Referring Physician (Gastroenterology) 06/11/2021  CHIEF COMPLAINTS/PURPOSE OF CONSULTATION:  Pancreatic mass, referred by Dr. Mickeal Skinner  SUMMARY OF ONCOLOGY HISTORY  Oncology History  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[place])  11/21/2020 Initial Diagnosis   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[place])   11/21/2020 Cancer Staging   Staging form: Brain and Spinal Cord, AJCC 8th Edition - Pathologic stage from 11/21/2020: WHO Grade IV - Signed by Ventura Sellers, MD on 11/30/2020 Histopathologic type: Glioblastoma Stage prefix: Initial diagnosis Histologic grading system: 4 grade system Extent of surgical resection: Subtotal resection Karnofsky performance status: Score 90 Seizures at presentation: Present Duration of symptoms before diagnosis: Short IDH1 mutation: Negative    12/27/2020 - 02/07/2021 Radiation Therapy   IMRT and concurrent Temodar Isidore Moos)   03/12/2021 -  Chemotherapy   Patient is on Treatment Plan : BRAIN GLIOBLASTOMA Consolidation Temozolomide Days 1-5 q28 Days         HISTORY OF PRESENTING ILLNESS:  [name] 56 y.o. female with PMH including HTN, OSA, HL, asthma, hypothyroid, migraines, and glioblastoma dx 11/2020 s/p subtotal resection, IMRT with concurrent Temodar, and currently on maintenance Temodar is here because of an enlarging pancreatic mass.  She presented to ED 11/15/2020 with first ever seizure, work-up showed left temporal necrotic lesion measuring 3.5 x 3.1 x 3.4 cm with intralesional hemorrhage and surrounding edema.  Noncontrast CT CAP revealed a small right upper lobe pulmonary nodule measuring 4 mm that was stable from a prior exam, hepatomegaly, and a hypodense area in the pancreatic head/uncinate process  measuring 1.6 x 1.5 cm.  Follow-up contrast study showed a hypodense lesion measuring 1.2 cm in the pancreatic head,  possibly representing a cyst, and multiple heterogeneous appearing thyroid nodules.  MR abdomen 11/16/2020 showed a very subtle lesion in the inferior aspect of the pancreatic head.  She underwent left craniotomy with near total resection by Dr. Emelda Brothers 11/21/2020, path revealed WHO grade IV glioblastoma, IDH 1/2 wild-type.  She was seen by GI Dr. Carol Ada during hospitalization who felt EUS and FNA of the pancreatic lesion could be done after she recovered from neurosurgery if still appropriate.  She went on to complete IMRT and concurrent Temodar under the care of Dr. Isidore Moos and Dr. Mickeal Skinner.  She is now currently on maintenance Temodar on days 1 through 5 q. 28 days.  Due to worsening abdominal cramping she underwent CTAP with contrast 05/25/2021 which showed interval enlargement of the pancreatic head mass now measuring 1.6 x 1.4 cm demonstrating relative hypoenhancement.  There is less than 180 degree abutment of the SMV, no involvement with the celiac or SMA, and no evidence of metastatic disease in the abdomen or pelvis. Brain MRI same day showed increasing size of irregular enhancement measuring 1.6 x 1.9 cm from 1.2 cm on 03/02/21. This is being followed closely to see if this is true progression vs pseudo progression.  She was referred to [place] for further work-up and recommendations regarding the pancreas mass.   Socially, she is divorced/single. Her daughter lives with her, she has 2 children.  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 She is independent with ADLs and drives.  Denies alcohol, tobacco, or drug use.  Family history is significant for a maternal aunt with pancreas  cancer, a maternal aunt with lung cancer, a maternal uncle with lung cancer, and colon cancer in an unknown family member.  Patient is overdue for mammogram and colonoscopy (last done  05/04/2009 at [facility]).  S/p hysterectomy.  Today she presents with her niece, [name], her brother is on the phone.  She feels tired and overwhelmed.  Her appetite has decreased over the last 1-2 weeks, with upper abdominal bloating.  No BM since CT contrast on 05/25/2021.  She is passing gas.  Denies nausea/vomiting.  Denies back pain, signs of jaundice, light stool.  Urine color goes from light to dark.  She has persistent daily headaches and fatigue but continues to work full-time and remain active at home.  She completed day 5 Temodar recently.     MEDICAL HISTORY:  Past Medical History:  Diagnosis Date   Bell's palsy    left side of face- notied in smile and eyes, if very tired   Brain cancer ([place])    Complication of anesthesia    N/V   Essential hypertension    External hemorrhoids    History of blood transfusion    post op Hemorroids   History of pneumonia    Hyperlipidemia    Hyperthyroidism    hx 56 years old- oral meds   Iron deficiency anemia    Migraine headache    Pneumonia    x 2 - years ago   PONV (postoperative nausea and vomiting)    PPD positive    Pulmonary nodules    not seen on plain cxr, first detected 1/07 re ct 10/08 pos IPPD > 15 mm 12/09 FOB/lavage 04/20/08 neg afb smear   PVC's (premature ventricular contractions)    Seizure ([place]) 11/15/2020   Sleep apnea    refused CPAP- mouth piece recommended   Tuberculosis    in her late 30's    SURGICAL HISTORY: Past Surgical History:  Procedure Laterality Date   ABDOMINAL HYSTERECTOMY     APPLICATION OF CRANIAL NAVIGATION N/A 11/21/2020   Procedure: APPLICATION OF CRANIAL NAVIGATION;  Surgeon: Judith Part, MD;  Location: [facility];  Service: Neurosurgery;  Laterality: N/A;   BREAST BIOPSY Right    CHOLECYSTECTOMY  2014   COLONOSCOPY  05/04/2009   prolapsing external hemorrhoids   CRANIOTOMY Left 11/21/2020   Procedure: Left craniotomy for tumor resection;  Surgeon: Judith Part, MD;  Location: [facility];  Service: Neurosurgery;  Laterality: Left;   ENDOMETRIAL ABLATION     HEMORRHOID SURGERY     x 3   TUBAL LIGATION      SOCIAL HISTORY: Social History   Socioeconomic History   Marital status: Divorced    Spouse name: Not on file   Number of children: 2   Years of education: Not on file   Highest education level: Associate degree: occupational, Hotel manager, or vocational program  Occupational History   Occupation: accounts Sport and exercise psychologist: [employer]  Tobacco Use   Smoking status: Never   Smokeless tobacco: Never  Vaping Use   Vaping Use: Never used  Substance and Sexual Activity   Alcohol use: No    Alcohol/week: 0.0 standard drinks   Drug use: No   Sexual activity: Not on file  Other Topics Concern   Not on file  Social History Narrative   Divorced with 2 children   She works as a Librarian, 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[place] TB exposure 2008 (after nodules found)   From [place]  lived there and [place]   Never owned birds      Social Determinants of Radio broadcast assistant Strain: Medium Risk   Difficulty of Paying Living Expenses: Somewhat hard  Food Insecurity: Landscape architect Present   Worried About Charity fundraiser in [place]: Sometimes true   Arboriculturist in the Last Year: Never true  Transportation Needs: No Transportation Needs   Lack of Transportation (Medical): No   Lack of Transportation (Non-Medical): No  Physical Activity: Unknown   Days of Exercise per Week: 0 days   Minutes of Exercise per Session: Not on file  Stress: No Stress Concern Present   Feeling of Stress : Only a little  Social Connections: Moderately Integrated   Frequency of Communication with Friends and Family: Twice a week   Frequency of Social Gatherings with Friends and Family: Twice a week   Attends Religious Services: More than 4 times per year   Active Member of [employer] or Organizations: Yes   Attends Music therapist: More than 4  times per year   Marital Status: Divorced  Human resources officer Violence: Not on file    FAMILY HISTORY: Family History  Problem Relation Age of Onset   Diabetes type II Mother    Kidney failure Sister    Diabetes Mellitus II Sister    Colon cancer Maternal Aunt        aunts and uncles   Diabetes Mellitus II Maternal Aunt    Cancer Maternal Aunt        pancreas   Colon cancer Maternal Uncle    Diabetes Mellitus II Maternal Uncle    Heart disease Maternal Grandmother        aunts, uncles, sister   Diabetes Mellitus II Maternal Grandmother    Lung cancer Other        aunts and uncles    ALLERGIES:  has No Known Allergies.  MEDICATIONS:  Current Outpatient Medications  Medication Sig Dispense Refill   amitriptyline (ELAVIL) 50 MG tablet Take 1 tablet (50 mg total) by mouth at bedtime. 60 tablet 1   amLODipine (NORVASC) 5 MG tablet Take 1 tablet (5 mg total) by mouth daily. 30 tablet 3   baclofen 5 MG TABS Take 5 mg by mouth 3 (three) times daily as needed for muscle spasms. 60 tablet 1   [employer] D3 125 MCG (5000 UT) capsule TAKE 1 CAPSULE (5,000 UNITS TOTAL) BY MOUTH DAILY. 90 capsule 3   lacosamide (VIMPAT) 50 MG TABS tablet TAKE 1 TABLET BY MOUTH TWICE A DAY 60 tablet 3   levETIRAcetam (KEPPRA) 750 MG tablet TAKE 2 TABLETS (1,500 MG TOTAL) BY MOUTH 2 (TWO) TIMES DAILY. 360 tablet 1   levothyroxine (SYNTHROID) 100 MCG tablet TAKE 1 TABLET BY MOUTH EVERY DAY 90 tablet 3   LORazepam (ATIVAN) 2 MG tablet Take 1 tablet (2 mg total) by mouth every 8 (eight) hours as needed for seizure. (Patient not taking: Reported on 04/19/2021) 12 tablet 0   ondansetron (ZOFRAN) 8 MG tablet Take 1 tablet by mouth twice daily as needed for nausea & vomiting. May take 30-60 minutes prior to Temodar administration if nausea/vomiting occurs. (Patient not taking: Reported on 04/19/2021) 30 tablet 1   potassium chloride 20 MEQ TBCR Take 20 mEq by mouth daily. 90 tablet 1   temozolomide (TEMODAR) 100 MG capsule  Take 3 capsules (300 mg total) by mouth daily. Take for 5 days on, 23 days off. Repeat every  28 days. May take on an empty stomach to decrease nausea & vomiting. 15 capsule 0   No current facility-administered medications for this visit.    REVIEW OF SYSTEMS:   Constitutional: Denies fevers, chills or abnormal night sweats (+) fatigue (+) weight fluctuates (+) decreased appetite Eyes: Denies blurriness of vision, double vision or watery eyes Ears, nose, mouth, throat, and face: Denies mucositis or sore throat Respiratory: Denies cough, dyspnea or wheezes Cardiovascular: Denies palpitation, chest discomfort or lower extremity swelling Gastrointestinal:  Denies nausea, vomiting, diarrhea, hematochezia, heartburn or change in bowel habits (+) abdominal bloating (+) constipation, no BM in 2 weeks Skin: Denies abnormal skin rashes Lymphatics: Denies new lymphadenopathy or easy bruising Neurological:Denies numbness, tingling or new weaknesses (+) headaches  Behavioral/Psych: Mood is stable, no new changes  All other systems were reviewed with the patient and are negative.  PHYSICAL EXAMINATION: ECOG PERFORMANCE STATUS: 1 - Symptomatic but completely ambulatory  Vitals:   06/11/21 1059  BP: (!) 149/104  Pulse: (!) 104  Resp: 17  Temp: 97.9 F (36.6 C)  SpO2: 99%   Filed Weights   06/11/21 1059  Weight: 189 lb 14.4 oz (86.1 kg)    GENERAL:alert, no distress and comfortable SKIN: no rash  EYES: sclera clear OROPHARYNX:no exudate, no erythema and lips, buccal mucosa, and tongue normal  LYMPH:  no palpable cervical or supraclavicular lymphadenopathy LUNGS: clear with normal breathing effort HEART: tachycardic, regular rhythm, no lower extremity edema ABDOMEN: abdomen soft, non-tender, absent bowel sounds  Musculoskeletal: no cyanosis of digits and no clubbing  PSYCH: alert & oriented x 3 with fluent speech NEURO: no focal motor/sensory deficits  LABORATORY DATA:  I have reviewed  the data as listed CBC Latest Ref Rng & Units 06/11/2021 05/29/2021 04/25/2021  WBC 4.0 - 10.5 K/uL 3.8(L) 5.1 6.4  Hemoglobin 12.0 - 15.0 g/dL 13.6 14.0 11.2(L)  Hematocrit 36.0 - 46.0 % 39.9 41.3 32.2(L)  Platelets 150 - 400 K/uL 212 174 180   CMP Latest Ref Rng & Units 06/11/2021 05/29/2021 04/25/2021  Glucose 70 - 99 mg/dL 100(H) 149(H) 184(H)  BUN 6 - 20 mg/dL _0 Creatinine 0.44 - 1.00 mg/dL 1.01(H) 1.12(H) 0.90  Sodium 135 - 145 mmol/L 142 140 141  Potassium 3.5 - 5.1 mmol/L 3.7 3.6 3.4(L)  Chloride 98 - 111 mmol/L 104 103 105  CO2 22 - 32 mmol/L _1 Calcium 8.9 - 10.3 mg/dL 10.4(H) 10.5(H) 9.3  Total Protein 6.5 - 8.1 g/dL 7.5 7.4 6.2(L)  Total Bilirubin 0.3 - 1.2 mg/dL 1.9(H) 2.3(H) 0.7  Alkaline Phos 38 - 126 U/L 101 104 88  AST 15 - 41 U/L 32 17 10(L)  ALT 0 - 44 U/L 53(H) 28 15     RADIOGRAPHIC STUDIES: I have personally reviewed the radiological images as listed and agreed with the findings in the report. MR BRAIN W WO CONTRAST  Result Date: 05/25/2021 CLINICAL DATA:  Brain/CNS neoplasm, assess treatment response EXAM: MRI HEAD WITHOUT AND WITH CONTRAST TECHNIQUE: Multiplanar, multiecho pulse sequences of the brain and surrounding structures were obtained without and with intravenous contrast. CONTRAST:  1m GADAVIST GADOBUTROL 1 MMOL/ML IV SOLN COMPARISON:  03/02/2021 FINDINGS: Brain: Stable appearance of postoperative changes in the left temporal lobe. There is no new enhancement about the resection cavity. Adjacent T2 FLAIR hyperintensity is stable. However, increase in size of irregular enhancement deep to the posterior insula and temporal operculum, which is now coalesced into a single lesion measuring 1.6 x  1.9 cm (previously clustered enhancement measured up to 1.2 cm). Extent of T2 FLAIR hyperintensity in this region has increased with involvement of the white matter tracts about the lentiform nucleus as well as the periatrial white matter posteriorly. No acute  infarction or hemorrhage. No significant mass effect. No hydrocephalus. Vascular: Major vessel flow voids at the skull base are preserved. Skull and upper cervical spine: Normal marrow signal is preserved. Left pterional craniotomy. Sinuses/Orbits: Paranasal sinuses are aerated. Orbits are unremarkable. Other: Sella is unremarkable.  Mastoid air cells are clear. IMPRESSION: Increased irregular enhancement deep to the posterior left insula and temporal operculum with increased surrounding T2 FLAIR hyperintensity. Stable appearance of resection site. Electronically Signed   By: Macy Mis M.D.   On: 05/25/2021 11:50   CT Abdomen Pelvis W Contrast  Result Date: 05/25/2021 CLINICAL DATA:  Pancreatic mass EXAM: CT ABDOMEN AND PELVIS WITH CONTRAST TECHNIQUE: Multidetector CT imaging of the abdomen and pelvis was performed using the standard protocol following bolus administration of intravenous contrast. RADIATION DOSE REDUCTION: This exam was performed according to the departmental dose-optimization program which includes automated exposure control, adjustment of the mA and/or kV according to patient size and/or use of iterative reconstruction technique. CONTRAST:  174m OMNIPAQUE IOHEXOL 300 MG/ML  SOLN COMPARISON:  MRI abdomen dated November 16, 2020; CT chest abdomen and pelvis dated November 15, 2020 FINDINGS: Lower chest: Bibasilar atelectasis. Stable small solid right lobe pulmonary nodule measuring 4 mm on series 2, image 160. Hepatobiliary: No focal liver abnormality is seen. Status post cholecystectomy. No biliary dilatation. Pancreas: Lesion of the pancreatic head/uncinate process measuring 1.6 x 1.4 cm on series 4, image 60, previously measured 1.3 x 1.2 cm on prior CT, and demonstrates relative hypoenhancement compared with the pancreatic parenchyma. Less than 180 degree abutment of the SMV. No involvement the celiac or SMA. No pancreatic ductal dilation. Spleen: Normal in size without focal abnormality.  Adrenals/Urinary Tract: Adrenal glands are unremarkable. Kidneys are normal, without renal calculi, focal lesion, or hydronephrosis. Bladder is unremarkable. Stomach/Bowel: Stomach is within normal limits. Appendix appears normal. No evidence of bowel wall thickening, distention, or inflammatory changes. Vascular/Lymphatic: Aortic atherosclerosis. No enlarged abdominal or pelvic lymph nodes. Reproductive: No adnexal masses. Other: No abdominal wall hernia or abnormality. No abdominopelvic ascites. Musculoskeletal: No acute or significant osseous findings. IMPRESSION: 1. Lesion of the pancreatic head/incident is increased in size when compared with prior CT, concerning for pancreatic adenocarcinoma. 2. No evidence of metastatic disease in the abdomen or pelvis. Electronically Signed   By: LYetta GlassmanM.D.   On: 05/25/2021 15:58    ASSESSMENT & PLAN: 56yo female   Mass in head of pancreas -We reviewed her medical record including lab, imaging, and surgical path in detail with the patient and her niece. -She presented to ED 11/15/2020 with new onset seizure, she was ultimately diagnosed with grade IV glioblastoma.  Work-up also showed 1.2 cm mass in the pancreatic head/uncinate process. She saw GI Dr. HBenson Norwaywhile admitted for brain tumor management and craniotomy, he offered EUS/FNA after she recovered from brain surgery. She subsequently had adjuvant chemoradiation and is currently on consolidation temodar. -Due to abdominal cramping and patient request, she underwent repeat CT CAP 05/25/2021 which showed mild enlargement of the lesion now measuring 1.6 x 1.4 cm, with less than 180 degree abutment of the SMV and no involvement of the celiac or SMA.  No evidence of metastatic disease in the abdomen or pelvis. -We discussed the differential which includes  benign etiology, vs indolent tumor such as neuroendocrine tumor or low-grade adenocarcinoma, versus adenocarcinoma.  -We are referring her for EUS with  FNA/biopsy by Dr. Benson Norway, patient agrees -She is agreeable to labs today including chromogranin and CA 19-9 tumor markers to help narrow the differential -Ms. Fusaro was seen with Dr. Burr Medico. We plan to see her back in 2-3 weeks to review final pathology and discuss treatment   Glioblastoma, WHO grade IV, IDH 1/2 wild-type -She presented to the ED 11/15/2020 with new onset seizures, work-up showed large enhancing left temporal mass.  Pancreatic lesion was found incidentally on ED work-up -S/p left craniotomy 11/21/2020 by Dr. Emelda Brothers who achieved near-total resection -S/p adjuvant IMRT (Dr. Isidore Moos) and concurrent Temodar 12/27/2020 - 02/07/2021 -Currently on consolidation Temodar days 1-5 q. 28 days, followed by Dr. Mickeal Skinner -Recent brain MRI 05/25/2021 showed enlarging area of enhancement concerning for progression versus pseudoprogression, close follow-up MRI scheduled 06/22/2021  Constipation, bloating, decreased appetite  -She had GI upset and BM after drinking CT contrast 05/25/2021, no BM since then -She started MiraLAX 06/10/2021 -I recommended magnesium citrate to have adequate BM, then okay to maintain on MiraLAX  Genetics  -She has a personal history of GBM and family history of pancreatic, lung, and colon cancer's, she would likely benefit from genetic testing especially if found to have second malignancy  Social  -Ms. Cousin is single, 2 children, works full-time. She has strong family support  -GI navigator has referred her to social work for Manufacturing systems engineer and nutrition  PLAN: -Medical record reviewed -Labs today for CBC, CMP, chromogranin A, and CA 19-9 -EUS and FNA for tissue diagnosis per Dr. Carol Ada -Follow-up in 2-3 weeks to review final path and discuss treatment    Orders Placed This Encounter  Procedures   CBC with Differential ([facility] Only)    Standing Status:   Standing    Number of Occurrences:   1    Standing Expiration Date:   06/11/2022   CMP  ([place] only)    Standing Status:   Standing    Number of Occurrences:   1    Standing Expiration Date:   06/11/2022   CA 19.9    Standing Status:   Standing    Number of Occurrences:   1    Standing Expiration Date:   06/11/2022   Chromogranin A    Standing Status:   Standing    Number of Occurrences:   1    Standing Expiration Date:   06/11/2022    All questions were answered. The patient knows to call the clinic with any problems, questions or concerns.     Alla Feeling, NP 06/11/2021    Addendum  I have seen the patient, examined her. I agree with the assessment and and plan and have edited the notes.   56 yo female who was incidentally found a 1.3 cm mass in the head of the pancreas on her staging scan when she was diagnosed with GBM.  The pancreatic mass now has slightly increased to 1.6 cm, concerning for malignancy.  Given the slow change over 6 months, I think adenocarcinoma is less likely, neuroendocrine tumor or benign lesion are possible.  Regardless, I would recommend US biopsy, will refer her back to GI Dr. Benson Norway, will check a tumor marker CA 19.9 and chromogranin A today.  She is currently on adjuvant Temodar for GBM, which may impact her treatment decision if her biopsy shows malignancy. I will  see her back after biopsy.  Questions were answered.  Truitt Merle  06/11/2021

## 2021-06-11 ENCOUNTER — Encounter: Payer: Self-pay | Admitting: Nurse Practitioner

## 2021-06-11 ENCOUNTER — Other Ambulatory Visit: Payer: Self-pay

## 2021-06-11 ENCOUNTER — Inpatient Hospital Stay: Payer: BC Managed Care – PPO

## 2021-06-11 ENCOUNTER — Inpatient Hospital Stay: Payer: BC Managed Care – PPO | Attending: Internal Medicine | Admitting: Nurse Practitioner

## 2021-06-11 VITALS — BP 149/104 | HR 104 | Temp 97.9°F | Resp 17 | Ht 68.0 in | Wt 189.9 lb

## 2021-06-11 DIAGNOSIS — I10 Essential (primary) hypertension: Secondary | ICD-10-CM | POA: Diagnosis not present

## 2021-06-11 DIAGNOSIS — Z801 Family history of malignant neoplasm of trachea, bronchus and lung: Secondary | ICD-10-CM | POA: Diagnosis not present

## 2021-06-11 DIAGNOSIS — K8689 Other specified diseases of pancreas: Secondary | ICD-10-CM

## 2021-06-11 DIAGNOSIS — Z90710 Acquired absence of both cervix and uterus: Secondary | ICD-10-CM | POA: Diagnosis not present

## 2021-06-11 DIAGNOSIS — R569 Unspecified convulsions: Secondary | ICD-10-CM | POA: Insufficient documentation

## 2021-06-11 DIAGNOSIS — R140 Abdominal distension (gaseous): Secondary | ICD-10-CM | POA: Insufficient documentation

## 2021-06-11 DIAGNOSIS — K5900 Constipation, unspecified: Secondary | ICD-10-CM | POA: Diagnosis not present

## 2021-06-11 DIAGNOSIS — C719 Malignant neoplasm of brain, unspecified: Secondary | ICD-10-CM | POA: Diagnosis not present

## 2021-06-11 DIAGNOSIS — Z800 Family history of malignant neoplasm of digestive organs: Secondary | ICD-10-CM | POA: Diagnosis not present

## 2021-06-11 DIAGNOSIS — C712 Malignant neoplasm of temporal lobe: Secondary | ICD-10-CM | POA: Insufficient documentation

## 2021-06-11 LAB — CBC WITH DIFFERENTIAL (CANCER CENTER ONLY)
Abs Immature Granulocytes: 0.01 10*3/uL (ref 0.00–0.07)
Basophils Absolute: 0 10*3/uL (ref 0.0–0.1)
Basophils Relative: 1 %
Eosinophils Absolute: 0.2 10*3/uL (ref 0.0–0.5)
Eosinophils Relative: 4 %
HCT: 39.9 % (ref 36.0–46.0)
Hemoglobin: 13.6 g/dL (ref 12.0–15.0)
Immature Granulocytes: 0 %
Lymphocytes Relative: 25 %
Lymphs Abs: 0.9 10*3/uL (ref 0.7–4.0)
MCH: 31.2 pg (ref 26.0–34.0)
MCHC: 34.1 g/dL (ref 30.0–36.0)
MCV: 91.5 fL (ref 80.0–100.0)
Monocytes Absolute: 0.4 10*3/uL (ref 0.1–1.0)
Monocytes Relative: 10 %
Neutro Abs: 2.3 10*3/uL (ref 1.7–7.7)
Neutrophils Relative %: 60 %
Platelet Count: 212 10*3/uL (ref 150–400)
RBC: 4.36 MIL/uL (ref 3.87–5.11)
RDW: 12.3 % (ref 11.5–15.5)
WBC Count: 3.8 10*3/uL — ABNORMAL LOW (ref 4.0–10.5)
nRBC: 0 % (ref 0.0–0.2)

## 2021-06-11 LAB — CMP (CANCER CENTER ONLY)
ALT: 53 U/L — ABNORMAL HIGH (ref 0–44)
AST: 32 U/L (ref 15–41)
Albumin: 4.7 g/dL (ref 3.5–5.0)
Alkaline Phosphatase: 101 U/L (ref 38–126)
Anion gap: 8 (ref 5–15)
BUN: 12 mg/dL (ref 6–20)
CO2: 30 mmol/L (ref 22–32)
Calcium: 10.4 mg/dL — ABNORMAL HIGH (ref 8.9–10.3)
Chloride: 104 mmol/L (ref 98–111)
Creatinine: 1.01 mg/dL — ABNORMAL HIGH (ref 0.44–1.00)
GFR, Estimated: >60 mL/min (ref >60)
Glucose, Bld: 100 mg/dL — ABNORMAL HIGH (ref 70–99)
Potassium: 3.7 mmol/L (ref 3.5–5.1)
Sodium: 142 mmol/L (ref 135–145)
Total Bilirubin: 1.9 mg/dL — ABNORMAL HIGH (ref 0.3–1.2)
Total Protein: 7.5 g/dL (ref 6.5–8.1)

## 2021-06-11 NOTE — Progress Notes (Signed)
I met with ms [name] and her niece, [name] after here consultation with Cira Rue, NP and Dr Burr Medico.  I explained my role as a nurse navigator and provided my contact information. I explained the services provided at [facility] and provided written information.  [name] verbalized difficulty in affording her medication.   I told them I would reach out to our financial advocates to see if she can apply for the [employer], as she is on oral chemotherapy prescribed by Dr Mickeal Skinner.  I will refer to social services as well for assistance.  All questions were answered.  They verbalized understanding.

## 2021-06-12 ENCOUNTER — Encounter: Payer: Self-pay | Admitting: Hematology

## 2021-06-12 ENCOUNTER — Telehealth: Payer: Self-pay | Admitting: Hematology

## 2021-06-12 DIAGNOSIS — C712 Malignant neoplasm of temporal lobe: Secondary | ICD-10-CM | POA: Diagnosis not present

## 2021-06-12 DIAGNOSIS — K8689 Other specified diseases of pancreas: Secondary | ICD-10-CM

## 2021-06-12 NOTE — Telephone Encounter (Signed)
Scheduled follow-up appointment per 2/6 los. Patient is aware. °

## 2021-06-12 NOTE — Progress Notes (Signed)
Referral faxed to Dr Ulyses Amor office.

## 2021-06-13 ENCOUNTER — Encounter: Payer: Self-pay | Admitting: *Deleted

## 2021-06-13 ENCOUNTER — Telehealth: Payer: Self-pay | Admitting: Hematology

## 2021-06-13 NOTE — Progress Notes (Signed)
[place] [facility]  Initial Assessment   [name] is a 56 y.o. year old female contacted by phone. Clinical Social Work was referred by medical oncology for assessment of psychosocial needs.   SDOH (Social Determinants of Health) assessments performed: Yes   Distress Screen completed: No ONCBCN DISTRESS SCREENING 12/18/2020  Screening Type Initial Screening  Distress experienced in past week (1-10) 0  Physician notified of physical symptoms Yes  Referral to clinical psychology No  Referral to clinical social work No  Referral to dietition No  Referral to financial advocate No  Referral to support programs Yes  Referral to palliative care No      Family/Social Information:  Housing Arrangement: patient lives alone Family members/support persons in your life? Family, Friends/Colleagues, and [employer] concerns: no  Employment: Working full time. Income source: Employment Financial concerns:  Yes, specifically high cost of medication Type of concern: Utilities, Medical bills, and cancer medications Food access concerns: no Religious or spiritual practice: yes Services Currently in place:  none  Coping/ Adjustment to diagnosis: Patient understands treatment plan and what happens next? yes Concerns about diagnosis and/or treatment: Overwhelmed by information and How I will pay for the services I need Patient reported stressors: Higher education careers adviser Current coping skills/ strengths: Ability for insight , Capable of independent living , Armed forces logistics/support/administrative officer , Motivation for treatment/growth , Religious Affiliation , Supportive family/friends , and Work skills     SUMMARY: Current SDOH Barriers:  Medication procurement  Clinical Social Work Clinical Goal(s):  explore community resource options for unmet needs related to:  Financial Strain   Interventions: Discussed common feeling and emotions when being diagnosed with cancer, and the importance of  support during treatment Informed patient of the support team roles and support services at [facility] Provided [place] contact information and encouraged patient to call with any questions or concerns Provided patient with information about [facility]   Follow Up Plan: CSW will follow-up with patient by phone  Patient verbalizes understanding of plan: Yes    Kennith [place] , LCSW

## 2021-06-13 NOTE — Telephone Encounter (Signed)
Sch per 2/6 inbasket, pt aware of appt.

## 2021-06-14 LAB — CANCER ANTIGEN 19-9: CA 19-9: 8 U/mL (ref 0–35)

## 2021-06-14 LAB — CHROMOGRANIN A: Chromogranin A (ng/mL): 88.1 ng/mL (ref 0.0–101.8)

## 2021-06-20 ENCOUNTER — Other Ambulatory Visit: Payer: Self-pay

## 2021-06-20 ENCOUNTER — Other Ambulatory Visit: Payer: Self-pay | Admitting: Gastroenterology

## 2021-06-20 ENCOUNTER — Other Ambulatory Visit: Payer: Self-pay | Admitting: Internal Medicine

## 2021-06-20 DIAGNOSIS — R933 Abnormal findings on diagnostic imaging of other parts of digestive tract: Secondary | ICD-10-CM | POA: Diagnosis not present

## 2021-06-20 DIAGNOSIS — C719 Malignant neoplasm of brain, unspecified: Secondary | ICD-10-CM | POA: Diagnosis not present

## 2021-06-20 DIAGNOSIS — C250 Malignant neoplasm of head of pancreas: Secondary | ICD-10-CM | POA: Diagnosis not present

## 2021-06-20 DIAGNOSIS — K5900 Constipation, unspecified: Secondary | ICD-10-CM | POA: Diagnosis not present

## 2021-06-20 NOTE — Progress Notes (Signed)
The proposed treatment discussed in conference is for discussion purpose only and is not a binding recommendation.  The patients have not been physically examined, or presented with their treatment options.  Therefore, final treatment plans cannot be decided.  

## 2021-06-21 ENCOUNTER — Encounter: Payer: Self-pay | Admitting: Licensed Clinical Social Worker

## 2021-06-21 ENCOUNTER — Encounter: Payer: Self-pay | Admitting: Adult Health

## 2021-06-21 ENCOUNTER — Encounter: Payer: Self-pay | Admitting: Internal Medicine

## 2021-06-21 NOTE — Progress Notes (Signed)
Writer spoke with patient over the phone to inquire if the [employer] could apply on her behalf for a grant from [employer] to assist with the cost of medication.  Pt verbalized agreement w/ applying for assistance and will be given a gift card to [facility] once approved.  SW to continue to follow to secure funds.

## 2021-06-21 NOTE — Telephone Encounter (Signed)
Pt has been scheduled with Dr. Volanda Napoleon. Pt advised that the main focus rn is her bp. Pt advised to keep the appt. With Dr.Banks to try and get her Bp down. Pt advised that message would be routed to [facility] as [place]. Pt verbalized understanding.

## 2021-06-22 ENCOUNTER — Ambulatory Visit (INDEPENDENT_AMBULATORY_CARE_PROVIDER_SITE_OTHER): Payer: BC Managed Care – PPO | Admitting: Family Medicine

## 2021-06-22 ENCOUNTER — Other Ambulatory Visit: Payer: Self-pay

## 2021-06-22 ENCOUNTER — Encounter: Payer: Self-pay | Admitting: Family Medicine

## 2021-06-22 ENCOUNTER — Ambulatory Visit
Admission: RE | Admit: 2021-06-22 | Discharge: 2021-06-22 | Disposition: A | Discharge status: Home / Self Care | Payer: BC Managed Care – PPO | Source: Ambulatory Visit | Attending: Internal Medicine | Admitting: Internal Medicine

## 2021-06-22 VITALS — BP 144/96 | HR 100 | Temp 98.4°F | Ht 68.0 in | Wt 189.5 lb

## 2021-06-22 DIAGNOSIS — I10 Essential (primary) hypertension: Secondary | ICD-10-CM

## 2021-06-22 DIAGNOSIS — C719 Malignant neoplasm of brain, unspecified: Secondary | ICD-10-CM

## 2021-06-22 DIAGNOSIS — C729 Malignant neoplasm of central nervous system, unspecified: Secondary | ICD-10-CM | POA: Diagnosis not present

## 2021-06-22 MED ORDER — GADOBENATE DIMEGLUMINE 529 MG/ML IV SOLN
15.0000 mL | Freq: Once | INTRAVENOUS | Status: AC | PRN
  Administered 2021-06-22: 15 mL via INTRAVENOUS

## 2021-06-22 MED ORDER — AMLODIPINE BESYLATE 10 MG PO TABS: 10.0000 mg | ORAL_TABLET | Freq: Every day | ORAL | 2 refills | Status: DC

## 2021-06-22 NOTE — Progress Notes (Signed)
Subjective:    Patient ID: [name], female    DOB: [birth year], 56 y.o.   MRN: [id number]  Chief Complaint  Patient presents with   Hypertension    Possible rx change causing elevated B/p readings    HPI Patient is a 56 yo female with pmh sig for HTN, h/o migraines, PVCs, asthma, OSA, acquired anal stenosis, hyperthyroidism, Bell's palsy, h/o Glioblastoma s/p craniotomy  with subtotal resection, h/o seizure, positive PPD, h/o TB, mass in pancreas who was seen today for ongoing concern.  Patient endorses elevated blood pressure readings x1 month.  Currently taking Norvasc 5 mg daily which previously controlled BP.  Denies changes in diet.  Patient undergoing chemo with Temozolomide.  BP 149/104 at recent Oncology visit.  Past Medical History:  Diagnosis Date   Bell's palsy    left side of face- notied in smile and eyes, if very tired   [place])    Complication of anesthesia    N/V   Essential hypertension    External hemorrhoids    History of blood transfusion    post op Hemorroids   History of pneumonia    Hyperlipidemia    Hyperthyroidism    hx 56 years old- oral meds   Iron deficiency anemia    Migraine headache    Pneumonia    x 2 - years ago   PONV (postoperative nausea and vomiting)    PPD positive    Pulmonary nodules    not seen on plain cxr, first detected 1/07 re ct 10/08 pos IPPD > 15 mm 12/09 FOB/lavage 04/20/08 neg afb smear   PVC's (premature ventricular contractions)    Seizure ([place]) 11/15/2020   Sleep apnea    refused CPAP- mouth piece recommended   Tuberculosis    in her late 30's    No Known Allergies  ROS General: Denies fever, chills, night sweats, changes in weight, changes in appetite HEENT: Denies headaches, ear pain, changes in vision, rhinorrhea, sore throat CV: Denies CP, palpitations, SOB, orthopnea Pulm: Denies SOB, cough, wheezing GI: Denies abdominal pain, nausea, vomiting, diarrhea, constipation GU: Denies dysuria,  hematuria, frequency, vaginal discharge Msk: Denies muscle cramps, joint pains Neuro: Denies weakness, numbness, tingling Skin: Denies rashes, bruising Psych: Denies depression, anxiety, hallucinations    Objective:    Blood pressure (!) 144/96, pulse 100, temperature 98.4 F (36.9 C), temperature source Oral, height 5\' 8"  (1.727 m), weight 189 lb 8 oz (86 kg), SpO2 99 %.  Gen. Pleasant, well-nourished, in no distress, normal affect   HEENT: /AT, face symmetric, conjunctiva clear, no scleral icterus, PERRLA, EOMI, nares patent without drainage Lungs: no accessory muscle use, CTAB, no wheezes or rales Cardiovascular: RRR, no m/r/g, no peripheral edema Musculoskeletal: No deformities, no cyanosis or clubbing, normal tone Neuro:  A&Ox3, CN II-XII intact, normal gait Skin:  Warm, no lesions/ rash   Wt Readings from Last 3 Encounters:  06/22/21 189 lb 8 oz (86 kg)  06/11/21 189 lb 14.4 oz (86.1 kg)  05/29/21 192 lb (87.1 kg)    Lab Results  Component Value Date   WBC 3.8 (L) 06/11/2021   HGB 13.6 06/11/2021   HCT 39.9 06/11/2021   PLT 212 06/11/2021   GLUCOSE 100 (H) 06/11/2021   CHOL 169 01/25/2020   TRIG 102 01/25/2020   HDL 30 (L) 01/25/2020   LDLDIRECT 180.0 04/08/2018   LDLCALC 119 (H) 01/25/2020   ALT 53 (H) 06/11/2021   AST 32 06/11/2021   NA 142  06/11/2021   K 3.7 06/11/2021   CL 104 06/11/2021   CREATININE 1.01 (H) 06/11/2021   BUN 12 06/11/2021   CO2 30 06/11/2021   TSH 1.78 11/08/2020   INR 1.06 06/17/2009   HGBA1C 5.7 (H) 01/25/2020    Assessment/Plan:  Essential hypertension  -uncontrolled over the last month. -labs from 06/11/21 reviewed. -will d/c Norvasc 5 mg. -Will increase Norvasc to 10 mg daily.  Discussed r/b/a.  Pt to monitor for LE edema.  -Advised may need additional medication to help control bp if remains elevated despite Norvasc increase. -will have pt check bp at home and keep a log of readings. - Plan: amLODipine (NORVASC) 10 MG  tablet  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[place]) -dx'd 11/15/20.  S/p L craniotomy with near total resection 11/21/20 -s/p aduvant IMRT and concurrent Temodar  -continue consolidation Temodar days 1-5 q 28 days -continue f/u with Oncology  F/u in 2-4 wks, sooner if needed for HTN  Grier Mitts, MD

## 2021-06-22 NOTE — Patient Instructions (Signed)
A prescription for Amlodipine 10 mg  was sent to your pharmacy.  This is the medication you were already taking, but at the increased dose.  Check you blood pressure at home and keep a log of the readings to bring to your follow up appt in a few wks.  If your bp is consistently greater than 140/90 (either number) notify clinic.  Monitor for any lower extremity swelling.

## 2021-06-25 ENCOUNTER — Inpatient Hospital Stay: Payer: BC Managed Care – PPO

## 2021-06-25 NOTE — Progress Notes (Signed)
I spoke with Ms [name] and reviewed radiologist assessment of her pancreatic lesion at GI Conference on 2.15.2023.  He feels that the lesion in her pancreas is not adenocarcinoma.  She is scheduled for an EUS with Dr Benson Norway to evaluate this lesion in March 2023.  Dr Burr Medico requested her f/u appt be rescheduled to coincide with this.  I have reschedule this appt.  Ms [name] will call me if she is able to get the EUS completed sooner.  All questions were answered.  she verbalized understanding.

## 2021-06-26 ENCOUNTER — Inpatient Hospital Stay: Payer: BC Managed Care – PPO | Admitting: Nurse Practitioner

## 2021-06-26 ENCOUNTER — Other Ambulatory Visit: Payer: Self-pay

## 2021-06-26 ENCOUNTER — Telehealth: Payer: Self-pay | Admitting: Hematology

## 2021-06-26 ENCOUNTER — Telehealth: Payer: Self-pay | Admitting: *Deleted

## 2021-06-26 ENCOUNTER — Inpatient Hospital Stay: Payer: BC Managed Care – PPO

## 2021-06-26 ENCOUNTER — Inpatient Hospital Stay (HOSPITAL_BASED_OUTPATIENT_CLINIC_OR_DEPARTMENT_OTHER): Payer: BC Managed Care – PPO | Admitting: Internal Medicine

## 2021-06-26 VITALS — BP 156/96 | HR 92 | Temp 97.7°F | Resp 18 | Ht 68.0 in | Wt 187.0 lb

## 2021-06-26 DIAGNOSIS — C719 Malignant neoplasm of brain, unspecified: Secondary | ICD-10-CM | POA: Diagnosis not present

## 2021-06-26 DIAGNOSIS — R569 Unspecified convulsions: Secondary | ICD-10-CM | POA: Diagnosis not present

## 2021-06-26 DIAGNOSIS — Z90710 Acquired absence of both cervix and uterus: Secondary | ICD-10-CM | POA: Diagnosis not present

## 2021-06-26 DIAGNOSIS — I10 Essential (primary) hypertension: Secondary | ICD-10-CM | POA: Diagnosis not present

## 2021-06-26 DIAGNOSIS — C712 Malignant neoplasm of temporal lobe: Secondary | ICD-10-CM | POA: Diagnosis not present

## 2021-06-26 DIAGNOSIS — R140 Abdominal distension (gaseous): Secondary | ICD-10-CM | POA: Diagnosis not present

## 2021-06-26 DIAGNOSIS — K5900 Constipation, unspecified: Secondary | ICD-10-CM | POA: Diagnosis not present

## 2021-06-26 DIAGNOSIS — Z800 Family history of malignant neoplasm of digestive organs: Secondary | ICD-10-CM | POA: Diagnosis not present

## 2021-06-26 DIAGNOSIS — Z801 Family history of malignant neoplasm of trachea, bronchus and lung: Secondary | ICD-10-CM | POA: Diagnosis not present

## 2021-06-26 LAB — CMP (CANCER CENTER ONLY)
ALT: 28 U/L (ref 0–44)
AST: 22 U/L (ref 15–41)
Albumin: 4.6 g/dL (ref 3.5–5.0)
Alkaline Phosphatase: 114 U/L (ref 38–126)
Anion gap: 6 (ref 5–15)
BUN: 9 mg/dL (ref 6–20)
CO2: 31 mmol/L (ref 22–32)
Calcium: 10.1 mg/dL (ref 8.9–10.3)
Chloride: 106 mmol/L (ref 98–111)
Creatinine: 0.97 mg/dL (ref 0.44–1.00)
GFR, Estimated: >60 mL/min (ref >60)
Glucose, Bld: 120 mg/dL — ABNORMAL HIGH (ref 70–99)
Potassium: 3.5 mmol/L (ref 3.5–5.1)
Sodium: 143 mmol/L (ref 135–145)
Total Bilirubin: 1.2 mg/dL (ref 0.3–1.2)
Total Protein: 7.2 g/dL (ref 6.5–8.1)

## 2021-06-26 LAB — CBC WITH DIFFERENTIAL (CANCER CENTER ONLY)
Abs Immature Granulocytes: 0.01 10*3/uL (ref 0.00–0.07)
Basophils Absolute: 0 10*3/uL (ref 0.0–0.1)
Basophils Relative: 1 %
Eosinophils Absolute: 0.3 10*3/uL (ref 0.0–0.5)
Eosinophils Relative: 9 %
HCT: 39.2 % (ref 36.0–46.0)
Hemoglobin: 13.4 g/dL (ref 12.0–15.0)
Immature Granulocytes: 0 %
Lymphocytes Relative: 26 %
Lymphs Abs: 0.9 10*3/uL (ref 0.7–4.0)
MCH: 31.2 pg (ref 26.0–34.0)
MCHC: 34.2 g/dL (ref 30.0–36.0)
MCV: 91.2 fL (ref 80.0–100.0)
Monocytes Absolute: 0.4 10*3/uL (ref 0.1–1.0)
Monocytes Relative: 11 %
Neutro Abs: 1.9 10*3/uL (ref 1.7–7.7)
Neutrophils Relative %: 53 %
Platelet Count: 212 10*3/uL (ref 150–400)
RBC: 4.3 MIL/uL (ref 3.87–5.11)
RDW: 12.1 % (ref 11.5–15.5)
WBC Count: 3.6 10*3/uL — ABNORMAL LOW (ref 4.0–10.5)
nRBC: 0 % (ref 0.0–0.2)

## 2021-06-26 MED ORDER — TEMOZOLOMIDE 100 MG PO CAPS: 150.0000 mg/m2/d | ORAL_CAPSULE | Freq: Every day | ORAL | 0 refills | Status: DC

## 2021-06-26 MED ORDER — DEXAMETHASONE 4 MG PO TABS
4.0000 mg | ORAL_TABLET | Freq: Every day | ORAL | 1 refills | Status: DC
Start: 2021-06-26 — End: 2021-07-26

## 2021-06-26 NOTE — Telephone Encounter (Signed)
Ms. Kibble case was presented in our GI conference last week, given the appearance and overall stability in the past one year, we feel this in unlikely pancreatic adenocarcinoma, more likely benign or slow growing tumor such as NET. Biopsy is optional at this point. Due to her recent change on brain MRI, Dr. Mickeal Skinner plan to change her treatment and repeat brain MRI in near future. I have discussed with Drs Mickeal Skinner and Isidore Moos today, all of [place] feel her GBM treatment is more urgent, and we can hold on her pancreatic biopsy for now. I informed Dr. Benson Norway and he agrees also.   I called pt and discussed the above with her, she called her pastor who joined the conversation also. Tj and her pastor wants to make sure her pancreatic issue is not going to cause serous health issue in near future which I reassured her, and I will reevaluate her situation in 3 months. If her next few brain MRI showed improvement, or if she finish her GBM treatment down the road, then I will probably repeat her pancreatic CT and decide if we still biopsy the pancreatic mass. All their questions were answered.  Truitt Merle  06/26/2021

## 2021-06-26 NOTE — Progress Notes (Signed)
[facility] [place], [place] [address] [phone]   Interval Evaluation  Date of Service: 06/26/21 Patient Name: [name] Patient MRN: [id number] Patient DOB: Jun 27, 1965 Provider: Ventura Sellers, MD  Identifying Statement:  [name] is a 56 y.o. female with left temporal glioblastoma   Oncologic History: Oncology History  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[place])  11/21/2020 Initial Diagnosis   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[place])   11/21/2020 Cancer Staging   Staging form: Brain and Spinal Cord, AJCC 8th Edition - Pathologic stage from 11/21/2020: WHO Grade IV - Signed by Ventura Sellers, MD on 11/30/2020 Histopathologic type: Glioblastoma Stage prefix: Initial diagnosis Histologic grading system: 4 grade system Extent of surgical resection: Subtotal resection Karnofsky performance status: Score 90 Seizures at presentation: Present Duration of symptoms before diagnosis: Short IDH1 mutation: Negative    12/27/2020 - 02/07/2021 Radiation Therapy   IMRT and concurrent Temodar [name])   03/12/2021 -  Chemotherapy   Patient is on Treatment Plan : BRAIN GLIOBLASTOMA Consolidation Temozolomide Days 1-5 q28 Days        Biomarkers:  MGMT Methylated.  IDH 1/2 Wild type.  EGFR Unknown  TERT Unknown   Interval History: [name] presents today for follow up with MRI brain for evaluation following cycle #3 of Temodar.  No significant change from prior visit.  Nausea was tolerable during chemo this month.  Did have some difficulty with constipation, dosed Linzess.  Continues to describe moderate headaches several times per week.  Short term memory impairment and fatigue are still persistent.       H+P (11/25/20) Patient presented to medical attention this past month with first ever seizure.  She describes episode of sudden onset "disconnection" with tongue  biting and urination, followed by period of confusion.  CNS imaging demonstrated enhancing mass within left anterior temporal lobe, which was mostly resected by Dr. Zada Finders on 11/21/20.  Since surgery she had not had recurrence of seizures.  She complains of painful cramping of her right leg, which started ~1 week prior.  At this time the cramping is her main complaint.  Otherwise fully functional, independent, would like to return to work if possible.  Medications: Current Outpatient Medications on File Prior to Visit  Medication Sig Dispense Refill   amitriptyline (ELAVIL) 50 MG tablet TAKE 1 TABLET BY MOUTH EVERYDAY AT BEDTIME 90 tablet 2   amLODipine (NORVASC) 10 MG tablet Take 1 tablet (10 mg total) by mouth daily. 30 tablet 2   baclofen 5 MG TABS Take 5 mg by mouth 3 (three) times daily as needed for muscle spasms. 60 tablet 1   [employer] D3 125 MCG (5000 UT) capsule TAKE 1 CAPSULE (5,000 UNITS TOTAL) BY MOUTH DAILY. 90 capsule 3   lacosamide (VIMPAT) 50 MG TABS tablet TAKE 1 TABLET BY MOUTH TWICE A DAY 60 tablet 3   levETIRAcetam (KEPPRA) 750 MG tablet TAKE 2 TABLETS (1,500 MG TOTAL) BY MOUTH 2 (TWO) TIMES DAILY. 360 tablet 1   levothyroxine (SYNTHROID) 100 MCG tablet TAKE 1 TABLET BY MOUTH EVERY DAY 90 tablet 3   LORazepam (ATIVAN) 2 MG tablet Take 1 tablet (2 mg total) by mouth every 8 (eight) hours as needed for seizure. (Patient not taking: Reported on 04/19/2021) 12 tablet 0   ondansetron (ZOFRAN) 8 MG tablet Take 1 tablet by mouth twice daily as needed for nausea & vomiting. May take 30-60 minutes prior to  Temodar administration if nausea/vomiting occurs. (Patient not taking: Reported on 04/19/2021) 30 tablet 1   potassium chloride 20 MEQ TBCR Take 20 mEq by mouth daily. 90 tablet 1   temozolomide (TEMODAR) 100 MG capsule Take 3 capsules (300 mg total) by mouth daily. Take for 5 days on, 23 days off. Repeat every 28 days. May take on an empty stomach to decrease nausea & vomiting. 15 capsule 0    No current facility-administered medications on file prior to visit.    Allergies: No Known Allergies Past Medical History:  Past Medical History:  Diagnosis Date   Bell's palsy    left side of face- notied in smile and eyes, if very tired   Brain cancer ([place])    Complication of anesthesia    N/V   Essential hypertension    External hemorrhoids    History of blood transfusion    post op Hemorroids   History of pneumonia    Hyperlipidemia    Hyperthyroidism    hx 56 years old- oral meds   Iron deficiency anemia    Migraine headache    Pneumonia    x 2 - years ago   PONV (postoperative nausea and vomiting)    PPD positive    Pulmonary nodules    not seen on plain cxr, first detected 1/07 re ct 10/08 pos IPPD > 15 mm 12/09 FOB/lavage 04/20/08 neg afb smear   PVC's (premature ventricular contractions)    Seizure ([place]) 11/15/2020   Sleep apnea    refused CPAP- mouth piece recommended   Tuberculosis    in her late 30's   Past Surgical History:  Past Surgical History:  Procedure Laterality Date   ABDOMINAL HYSTERECTOMY     APPLICATION OF CRANIAL NAVIGATION N/A 11/21/2020   Procedure: APPLICATION OF CRANIAL NAVIGATION;  Surgeon: Judith Part, MD;  Location: [facility];  Service: Neurosurgery;  Laterality: N/A;   BREAST BIOPSY Right    CHOLECYSTECTOMY  2014   COLONOSCOPY  05/04/2009   prolapsing external hemorrhoids   CRANIOTOMY Left 11/21/2020   Procedure: Left craniotomy for tumor resection;  Surgeon: Judith Part, MD;  Location: [facility];  Service: Neurosurgery;  Laterality: Left;   ENDOMETRIAL ABLATION     HEMORRHOID SURGERY     x 3   TUBAL LIGATION     Social History:  Social History   Socioeconomic History   Marital status: Divorced    Spouse name: Not on file   Number of children: 2   Years of education: Not on file   Highest education level: Associate degree: occupational, Hotel manager, or vocational program  Occupational History   Occupation:  accounts Sport and exercise psychologist: [employer]  Tobacco Use   Smoking status: Never   Smokeless tobacco: Never  Vaping Use   Vaping Use: Never used  Substance and Sexual Activity   Alcohol use: No    Alcohol/week: 0.0 standard drinks   Drug use: No   Sexual activity: Not on file  Other Topics Concern   Not on file  Social History Narrative   Divorced with 2 children   She works as a Librarian, 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Press photographer    Possible TB exposure 2008 (after nodules found)   From [place] lived there and [place]   Never owned birds      Social Determinants of Radio broadcast assistant Strain: Medium Risk   Difficulty of Paying Living Expenses: Somewhat hard  Food Insecurity: Food Insecurity Present   Worried  About Running Out of Food in the Last Year: Sometimes true   Ran Out of Food in the Last Year: Never true  Transportation Needs: No Transportation Needs   Lack of Transportation (Medical): No   Lack of Transportation (Non-Medical): No  Physical Activity: Unknown   Days of Exercise per Week: 0 days   Minutes of Exercise per Session: Not on file  Stress: No Stress Concern Present   Feeling of Stress : Only a little  Social Connections: Moderately Integrated   Frequency of Communication with Friends and Family: Twice a week   Frequency of Social Gatherings with Friends and Family: Twice a week   Attends Religious Services: More than 4 times per year   Active Member of [employer] or Organizations: Yes   Attends Music therapist: More than 4 times per year   Marital Status: Divorced  Human resources officer Violence: Not on file   Family History:  Family History  Problem Relation Age of Onset   Diabetes type II Mother    Kidney failure Sister    Diabetes Mellitus II Sister    Colon cancer Maternal Aunt        aunts and uncles   Diabetes Mellitus II Maternal Aunt    Cancer Maternal Aunt        pancreas   Colon cancer Maternal Uncle    Diabetes Mellitus II Maternal Uncle    Heart disease  Maternal Grandmother        aunts, uncles, sister   Diabetes Mellitus II Maternal Grandmother    Lung cancer Other        aunts and uncles    Review of Systems: Constitutional: Doesn't report fevers, chills or abnormal weight loss Eyes: Doesn't report blurriness of vision Ears, nose, mouth, throat, and face: Doesn't report sore throat Respiratory: Doesn't report cough, dyspnea or wheezes Cardiovascular: Doesn't report palpitation, chest discomfort  Gastrointestinal:  Doesn't report nausea, constipation, diarrhea GU: Doesn't report incontinence Skin: Doesn't report skin rashes Neurological: Per HPI Musculoskeletal: Doesn't report joint pain Behavioral/Psych: +anxiety  Physical Exam: Vitals with BMI 06/26/2021 06/22/2021 06/11/2021  Height '5\' 8"'  '5\' 8"'  '5\' 8"'   Weight 187 lbs 189 lbs 8 oz 189 lbs 14 oz  BMI 28.44 19.50 93.26  Systolic 712 458 099  Diastolic 96 96 833  Pulse 92 100 104    KPS: 90. General: Alert, cooperative, pleasant, in no acute distress Head: Normal EENT: No conjunctival injection or scleral icterus.  Lungs: Resp effort normal Cardiac: Regular rate Abdomen: Non-distended abdomen Skin: No rashes cyanosis or petechiae. Extremities: No clubbing or edema  Neurologic Exam: Mental Status: Awake, alert, attentive to examiner. Oriented to self and environment. Language is fluent with intact comprehension.  Cranial Nerves: Visual acuity is grossly normal. Visual fields are full. Extra-ocular movements intact. No ptosis. Face L LMN paresis, chronic. Motor: Tone and bulk are normal. Power is full in both arms and legs. Reflexes are symmetric, no pathologic reflexes present.  Sensory: Intact to light touch Gait: Normal.   Labs: I have reviewed the data as listed    Component Value Date/Time   NA 143 06/26/2021 1016   K 3.5 06/26/2021 1016   CL 106 06/26/2021 1016   CO2 31 06/26/2021 1016   GLUCOSE 120 (H) 06/26/2021 1016   BUN 9 06/26/2021 1016   CREATININE  0.97 06/26/2021 1016   CREATININE 0.92 01/25/2020 1424   CALCIUM 10.1 06/26/2021 1016   PROT 7.2 06/26/2021 1016   ALBUMIN 4.6 06/26/2021  1016   AST 22 06/26/2021 1016   ALT 28 06/26/2021 1016   ALKPHOS 114 06/26/2021 1016   BILITOT 1.2 06/26/2021 1016   GFRNONAA >60 06/26/2021 1016   GFRNONAA 72 01/25/2020 1424   GFRAA 83 01/25/2020 1424   Lab Results  Component Value Date   WBC 3.6 (L) 06/26/2021   NEUTROABS 1.9 06/26/2021   HGB 13.4 06/26/2021   HCT 39.2 06/26/2021   MCV 91.2 06/26/2021   PLT 212 06/26/2021   Imaging:  [facility] Clinician Interpretation: I have personally reviewed the CNS images as listed.  My interpretation, in the context of the patient's clinical presentation, is treatment effect vs true progression  MR BRAIN W WO CONTRAST  Result Date: 06/22/2021 CLINICAL DATA:  CNS neoplasm, assess treatment response. History of glioblastoma. EXAM: MRI HEAD WITHOUT AND WITH CONTRAST TECHNIQUE: Multiplanar, multiecho pulse sequences of the brain and surrounding structures were obtained without and with intravenous contrast. CONTRAST:  11m MULTIHANCE GADOBENATE DIMEGLUMINE 529 MG/ML IV SOLN COMPARISON:  05/25/2021 FINDINGS: Brain: Progressive size of the irregularly enhancing mass posterior to the left insula which measures up to 2.3 cm, previously up to 1.6 cm. Progressive confluent adjacent T2 signal and swelling. No new areas of enhancement. No recurrence of enhancement at the margins of the left anterior temporal resection site. No incidental infarct, hemorrhage, hydrocephalus, or collection. Cerebral perfusion was requested and DJaconaperfusion was performed but full color maps are not available for interpretation. These have been requested with addendum created when available. Vascular: Normal flow voids. Skull and upper cervical spine: Unremarkable left craniotomy site. Sinuses/Orbits: Negative. IMPRESSION: 1. Progressive enhancing mass with T2 hyperintensity swelling at the lesion  posterior to the left insula. 2. No recurrent enhancement or swelling at the resection site. 3. Cerebral perfusion was performed but maps are not yet available for interpretation and addendum will created when available. Electronically Signed   By: JJorje GuildM.D.   On: 06/22/2021 11:30    Assessment/Plan 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[facility] [C71.9]  [name] MLUCRESIA SIMICis clinically stable today, now having completed cycle #3 of 5-day Temodar.  Brain MRI demonstrates further progression within left temporal lobe, posterior to original resection cavity but within high dose IMRT field.  Still, etiology is either organic progression of disease or pseudoprogression.    We reviewed treatment plan options, including an additional month of Temodar with dexamethasone, or transition to second line therapy (CCNU).  Would not use avastin at this time because of relative lack of symptoms.    She would prefer dosing an additional month of Temzolomide.  We recommended adding 451mdaily dexamethasone, and repeating MRI in 1 month with perfusion protocol.  In 1 month, CARIS profile will be complete as well.  We recommended continuing treatment with cycle #4 Temozolomide, still 150 mg/m2, on for five days and off for twenty three days in twenty eight day cycles. The patient will have a complete blood count performed on days 21 and 28 of each cycle, and a comprehensive metabolic panel performed on day 28 of each cycle. Labs may need to be performed more often. Zofran will prescribed for home use for nausea/vomiting.   Chemotherapy should be held for the following:  ANC less than 1,000  Platelets less than 100,000  LFT or creatinine greater than 2x ULN  If clinical concerns/contraindications develop  For seizures, will con't Vimpat 5046mID and Keppra 1500m44mD.  May con't Elavil 50mg23m baclofen 5mg T57mPRN.  Given changes  observed today, we ask that [name] return to  clinic in 1 month with perfusion protocol brain MRI for review, prior to cycle #5.  All questions were answered. The patient knows to call the clinic with any problems, questions or concerns. No barriers to learning were detected.  The total time spent in the encounter was 40 minutes and more than 50% was on counseling and review of test results   Ventura Sellers, MD Medical Director of Neuro-Oncology [facility] [place] 06/26/21 10:40 AM

## 2021-06-26 NOTE — Telephone Encounter (Signed)
-----   Message from Truitt Merle, MD sent at 06/26/2021 12:26 PM EST ----- Jenniffer Vessels,  Please call her late today, I just called her but no answer and could not get hold of her or leave her a VM  Please let her know that after discussion in tumor conference, we feel her pancreatic mass is unlikely going to cause significantly healthy issues, but her GBM needs additional treatment, so we decided to hold on her EUS biopsy of pancreatic mass (Dr. Benson Norway will cancel her scheduled procure). Please cancel her appointment with me in March, and will see her as needed in future.   If you are not able to get hold of her, please pass this message to Dr. Renda Rolls nurse and let her inform her since they are seeing her frequently in office.  Thanks  Krista Blue  ----- Message ----- From: Carol Ada, MD Sent: 06/25/2021  11:23 AM EST To: Eppie Gibson, MD, #  Thanks everybody for the input.  I will cancel her EUS with FNA.  Saralyn Pilar ----- Message ----- From: Ventura Sellers, MD Sent: 06/25/2021   9:02 AM EST To: Carol Ada, MD, Eppie Gibson, MD, #  Krista Blue,  This is certainly helpful input.  We will treat progressive brain tumor maximally, which may involve avastin.    I would leave it to you to select timing and modality of next set of abdominal imaging, in that case.  Thanks Thedore Mins ----- Message ----- From: Truitt Merle, MD Sent: 06/25/2021   8:58 AM EST To: Carol Ada, MD, Eppie Gibson, MD, #  We reviewed her case in GI conference last week, we feel her pancreatic mass is probably not adenocarcinoma, likely benign or neuroendocrine tumor.  If her GBM needs more treatment, that's crertainly more important. If her prognosis is guarded, then we probably can cancel her EUS and pancreatic biopsy.  I include Dr. Benson Norway here.   If you all agree, I will call her and discuss with her.   Thanks   Krista Blue  ----- Message ----- From: Eppie Gibson, MD Sent: 06/25/2021   7:40 AM EST To: Jimmy Footman, NP,  [facility], #  Dr. Burr Medico, I wanted to let you know that pt was just reviewed in CNS tumor board. It looks like her GBM may be progressing but we cannot entirely rule out pseudo progression. Thedore Mins will consider Avastin. Her prognosis is guarded but we agree to bx of the pancreas if pt really wants to know if she has another malignancy.  Nikki of palliative is also going to try to connect w/ pt for additional support. Marisue Brooklyn

## 2021-06-26 NOTE — Telephone Encounter (Signed)
Per Dr.Feng, called pt with message below. Pt called in her pastor and they are requesting a call back due to having questions that she needed more answers to. Advised pt Dr.Feng is out of office but will call her. Pt verbalized understanding

## 2021-06-27 ENCOUNTER — Encounter: Payer: Self-pay | Admitting: *Deleted

## 2021-06-27 ENCOUNTER — Telehealth: Payer: Self-pay

## 2021-06-27 NOTE — Progress Notes (Signed)
[name]  Clinical Social [name] contacted patient by phone. Informed patient received grant from [employer] organization for $500 to pay for Temodar medication.  [name] expressed appreciation and designated daughter, "[name]", to pick up $500 [employer] gift card from [employer].  CSW encouraged patient to follow up as needed with CSW team.   Gwinda [place], [place]

## 2021-06-27 NOTE — Telephone Encounter (Signed)
Attempted to call patient regarding secure chat message from Dr. Burr Medico about the conversation during her tumor board. Patient did not answer, and I was unable to leave a vm. Will try to contact patient again this evening.

## 2021-06-27 NOTE — Telephone Encounter (Signed)
Patient called back, and I was able to relay the following message to her from Dr. Burr Medico:   "Please let her know that after discussion in tumor conference, we feel her pancreatic mass is unlikely going to cause significantly healthy issues, but her GBM needs additional treatment, so we decided to hold on her EUS biopsy of pancreatic mass (Dr. Benson Norway will cancel her scheduled procure). Please cancel her appointment with me in March, and will see her as needed in future."  The patient had a lot of questions about why they wanted to put off doing a biopsy of the pancreatic mass, and if her MRI could be scheduled sooner than March 17th. I answered the questions to the best of my ability. Dr. Mickeal Skinner and Shelle Iron will be notified of this. I went ahead and sent a scheduling message to get the patient scheduled for lab appt and visit with Dr. Mickeal Skinner. The patient verbalized understanding and thanks.

## 2021-06-28 ENCOUNTER — Encounter: Payer: Self-pay | Admitting: Internal Medicine

## 2021-06-29 ENCOUNTER — Other Ambulatory Visit: Payer: Self-pay | Admitting: Radiation Therapy

## 2021-06-29 ENCOUNTER — Encounter: Payer: BC Managed Care – PPO | Admitting: Dietician

## 2021-06-29 ENCOUNTER — Ambulatory Visit: Payer: BC Managed Care – PPO | Admitting: Hematology

## 2021-07-02 ENCOUNTER — Other Ambulatory Visit: Payer: Self-pay | Admitting: Internal Medicine

## 2021-07-02 ENCOUNTER — Other Ambulatory Visit: Payer: Self-pay | Admitting: *Deleted

## 2021-07-02 DIAGNOSIS — C719 Malignant neoplasm of brain, unspecified: Secondary | ICD-10-CM

## 2021-07-02 MED ORDER — ONDANSETRON HCL 8 MG PO TABS: 8.0000 mg | ORAL_TABLET | Freq: Two times a day (BID) | ORAL | 1 refills | Status: DC | PRN

## 2021-07-04 ENCOUNTER — Other Ambulatory Visit (HOSPITAL_COMMUNITY): Payer: Self-pay

## 2021-07-06 ENCOUNTER — Telehealth: Payer: Self-pay | Admitting: Internal Medicine

## 2021-07-06 ENCOUNTER — Encounter (HOSPITAL_COMMUNITY): Payer: Self-pay

## 2021-07-06 NOTE — Telephone Encounter (Signed)
.  Called patient to schedule appointment per 3/2 inbasket, patient is aware of date and time.   ?

## 2021-07-08 ENCOUNTER — Other Ambulatory Visit: Payer: BC Managed Care – PPO

## 2021-07-13 ENCOUNTER — Other Ambulatory Visit: Payer: Self-pay | Admitting: Adult Health

## 2021-07-13 NOTE — Telephone Encounter (Signed)
I have tried to do a prior authorization for this but do not have her correct Pharmacy card information. I need the Pharmacy plan coverage information

## 2021-07-17 ENCOUNTER — Telehealth: Payer: Self-pay

## 2021-07-17 ENCOUNTER — Encounter: Payer: Self-pay | Admitting: Internal Medicine

## 2021-07-17 NOTE — Telephone Encounter (Signed)
Per Starla Link and conversation she had with patient, patient is using copay card and paying out of pocket without proceeding with PA. ?

## 2021-07-17 NOTE — Telephone Encounter (Signed)
Spoke with Patient regarding Pharmacy's request for prior authorization for Ondansetron. Patient stated that she has a co-pay assistance card that she uses for prescriptions and that she does not have prescription coverage that requires a prior authorization. Patient states that she paid twenty or thirty dollars for the prescription and that she is fine with paying that amount. No other needs or concerns verbalized at this time. ?

## 2021-07-20 ENCOUNTER — Encounter (HOSPITAL_COMMUNITY): Payer: Self-pay

## 2021-07-20 ENCOUNTER — Ambulatory Visit (HOSPITAL_COMMUNITY): Admit: 2021-07-20 | Payer: BC Managed Care – PPO | Admitting: Gastroenterology

## 2021-07-20 SURGERY — UPPER ESOPHAGEAL ENDOSCOPIC ULTRASOUND (EUS)
Anesthesia: Monitor Anesthesia Care

## 2021-07-25 ENCOUNTER — Telehealth: Payer: Self-pay

## 2021-07-25 NOTE — Telephone Encounter (Signed)
This nurse reached out to patient per provider and requested if office visit can be moved from Thursday 3/23 to Thursday 3/30.  Patient is in agreement with moving appointment, this nurse also reminded patient of appointment with Palliative NP on 3/23 and moved lab appointment for patients convenience.  No further questions or concerns at this time.   ?

## 2021-07-26 ENCOUNTER — Inpatient Hospital Stay: Payer: BC Managed Care – PPO | Attending: Internal Medicine | Admitting: Nurse Practitioner

## 2021-07-26 ENCOUNTER — Other Ambulatory Visit: Payer: Self-pay

## 2021-07-26 ENCOUNTER — Other Ambulatory Visit: Payer: Self-pay | Admitting: Internal Medicine

## 2021-07-26 ENCOUNTER — Ambulatory Visit: Payer: BC Managed Care – PPO | Admitting: Internal Medicine

## 2021-07-26 ENCOUNTER — Ambulatory Visit: Payer: BC Managed Care – PPO | Admitting: Hematology

## 2021-07-26 ENCOUNTER — Other Ambulatory Visit: Payer: BC Managed Care – PPO

## 2021-07-26 ENCOUNTER — Inpatient Hospital Stay: Payer: BC Managed Care – PPO

## 2021-07-26 VITALS — BP 140/86 | HR 122 | Temp 98.3°F | Resp 17 | Wt 188.0 lb

## 2021-07-26 DIAGNOSIS — Z90710 Acquired absence of both cervix and uterus: Secondary | ICD-10-CM

## 2021-07-26 DIAGNOSIS — I10 Essential (primary) hypertension: Secondary | ICD-10-CM

## 2021-07-26 DIAGNOSIS — D378 Neoplasm of uncertain behavior of other specified digestive organs: Secondary | ICD-10-CM | POA: Diagnosis not present

## 2021-07-26 DIAGNOSIS — Z800 Family history of malignant neoplasm of digestive organs: Secondary | ICD-10-CM | POA: Diagnosis not present

## 2021-07-26 DIAGNOSIS — Z515 Encounter for palliative care: Secondary | ICD-10-CM

## 2021-07-26 DIAGNOSIS — Z7189 Other specified counseling: Secondary | ICD-10-CM

## 2021-07-26 DIAGNOSIS — C719 Malignant neoplasm of brain, unspecified: Secondary | ICD-10-CM

## 2021-07-26 DIAGNOSIS — Z801 Family history of malignant neoplasm of trachea, bronchus and lung: Secondary | ICD-10-CM | POA: Diagnosis not present

## 2021-07-26 DIAGNOSIS — K869 Disease of pancreas, unspecified: Secondary | ICD-10-CM | POA: Diagnosis not present

## 2021-07-26 DIAGNOSIS — C712 Malignant neoplasm of temporal lobe: Secondary | ICD-10-CM | POA: Insufficient documentation

## 2021-07-26 DIAGNOSIS — R530 Neoplastic (malignant) related fatigue: Secondary | ICD-10-CM

## 2021-07-26 DIAGNOSIS — K8689 Other specified diseases of pancreas: Secondary | ICD-10-CM

## 2021-07-26 LAB — CMP (CANCER CENTER ONLY)
ALT: 31 U/L (ref 0–44)
AST: 14 U/L — ABNORMAL LOW (ref 15–41)
Albumin: 3.9 g/dL (ref 3.5–5.0)
Alkaline Phosphatase: 155 U/L — ABNORMAL HIGH (ref 38–126)
Anion gap: 10 (ref 5–15)
BUN: 21 mg/dL — ABNORMAL HIGH (ref 6–20)
CO2: 25 mmol/L (ref 22–32)
Calcium: 9.4 mg/dL (ref 8.9–10.3)
Chloride: 101 mmol/L (ref 98–111)
Creatinine: 1.08 mg/dL — ABNORMAL HIGH (ref 0.44–1.00)
GFR, Estimated: >60 mL/min (ref >60)
Glucose, Bld: 312 mg/dL — ABNORMAL HIGH (ref 70–99)
Potassium: 3.3 mmol/L — ABNORMAL LOW (ref 3.5–5.1)
Sodium: 136 mmol/L (ref 135–145)
Total Bilirubin: 1.5 mg/dL — ABNORMAL HIGH (ref 0.3–1.2)
Total Protein: 6.5 g/dL (ref 6.5–8.1)

## 2021-07-26 LAB — CBC WITH DIFFERENTIAL (CANCER CENTER ONLY)
Abs Immature Granulocytes: 0.05 10*3/uL (ref 0.00–0.07)
Basophils Absolute: 0 10*3/uL (ref 0.0–0.1)
Basophils Relative: 0 %
Eosinophils Absolute: 0.1 10*3/uL (ref 0.0–0.5)
Eosinophils Relative: 1 %
HCT: 42.3 % (ref 36.0–46.0)
Hemoglobin: 14.4 g/dL (ref 12.0–15.0)
Immature Granulocytes: 1 %
Lymphocytes Relative: 25 %
Lymphs Abs: 1.1 10*3/uL (ref 0.7–4.0)
MCH: 30.8 pg (ref 26.0–34.0)
MCHC: 34 g/dL (ref 30.0–36.0)
MCV: 90.4 fL (ref 80.0–100.0)
Monocytes Absolute: 0.3 10*3/uL (ref 0.1–1.0)
Monocytes Relative: 8 %
Neutro Abs: 2.7 10*3/uL (ref 1.7–7.7)
Neutrophils Relative %: 65 %
Platelet Count: 152 10*3/uL (ref 150–400)
RBC: 4.68 MIL/uL (ref 3.87–5.11)
RDW: 12.4 % (ref 11.5–15.5)
WBC Count: 4.2 10*3/uL (ref 4.0–10.5)
nRBC: 0 % (ref 0.0–0.2)

## 2021-07-26 MED ORDER — DEXAMETHASONE 4 MG PO TABS: 2.0000 mg | ORAL_TABLET | Freq: Every day | ORAL | 1 refills | Status: DC

## 2021-07-27 ENCOUNTER — Encounter: Payer: Self-pay | Admitting: Nurse Practitioner

## 2021-07-27 ENCOUNTER — Ambulatory Visit
Admission: RE | Admit: 2021-07-27 | Discharge: 2021-07-27 | Disposition: A | Discharge status: Home / Self Care | Payer: BC Managed Care – PPO | Source: Ambulatory Visit | Attending: Internal Medicine | Admitting: Internal Medicine

## 2021-07-27 ENCOUNTER — Encounter: Payer: Self-pay | Admitting: Internal Medicine

## 2021-07-27 DIAGNOSIS — R220 Localized swelling, mass and lump, head: Secondary | ICD-10-CM | POA: Diagnosis not present

## 2021-07-27 DIAGNOSIS — C719 Malignant neoplasm of brain, unspecified: Secondary | ICD-10-CM | POA: Diagnosis not present

## 2021-07-27 MED ORDER — GADOBENATE DIMEGLUMINE 529 MG/ML IV SOLN
17.0000 mL | Freq: Once | INTRAVENOUS | Status: AC | PRN
  Administered 2021-07-27: 17 mL via INTRAVENOUS

## 2021-07-27 NOTE — Progress Notes (Signed)
? ?  ?Palliative Medicine ?[place]  ?[phone] [phone] ? ? ?Name: [name] ?Date: 07/27/2021 ?MRN: [id number]  ?DOB: 11-Jun-1965 ? ?Patient Care Team: ?[name], NP as PCP - General (Family Medicine) ?Jacki Cones, MD as Referring Physician (Gastroenterology) ?Truitt Merle, MD as Consulting Physician (Oncology) ?Ventura Sellers, MD as Consulting Physician (Oncology)  ? ? ?REASON FOR CONSULTATION: ?[name] is a 56 y.o. female with medical history of glioblastoma s/p resection (11/2020) IMRT, currently on dexamethasone and Temodar, pancreatic lesion, hypertension, and hyperlipidemia  .  Palliative ask to see for symptom management and goals of care.  ? ? ?SOCIAL HISTORY:    ? reports that she has never smoked. She has never used smokeless tobacco. She reports that she does not drink alcohol and does not use drugs. ? ?ADVANCE DIRECTIVES:  ?None on file  ? ?CODE STATUS:  ? ?PAST MEDICAL HISTORY: ?Past Medical History:  ?Diagnosis Date  ? Bell's palsy   ? left side of face- notied in smile and eyes, if very tired  ? Brain cancer [facility])   ? Complication of anesthesia   ? N/V  ? Essential hypertension   ? External hemorrhoids   ? History of blood transfusion   ? post op Hemorroids  ? History of pneumonia   ? Hyperlipidemia   ? Hyperthyroidism   ? hx 56 years old- oral meds  ? Iron deficiency anemia   ? Migraine headache   ? Pneumonia   ? x 2 - years ago  ? PONV (postoperative nausea and vomiting)   ? PPD positive   ? Pulmonary nodules   ? not seen on plain cxr, first detected 1/07 re ct 10/08 pos IPPD > 15 mm 12/09 FOB/lavage 04/20/08 neg afb smear  ? PVC's (premature ventricular contractions)   ? Seizure ([place]) 11/15/2020  ? Sleep apnea   ? refused CPAP- mouth piece recommended  ? Tuberculosis   ? in her late 21's  ? ? ?PAST SURGICAL HISTORY:  ?Past Surgical History:  ?Procedure Laterality Date  ? ABDOMINAL HYSTERECTOMY    ? APPLICATION OF CRANIAL NAVIGATION  N/A 11/21/2020  ? Procedure: APPLICATION OF CRANIAL NAVIGATION;  Surgeon: Judith Part, MD;  Location: [place];  Service: Neurosurgery;  Laterality: N/A;  ? BREAST BIOPSY Right   ? CHOLECYSTECTOMY  2014  ? COLONOSCOPY  05/04/2009  ? prolapsing external hemorrhoids  ? CRANIOTOMY Left 11/21/2020  ? Procedure: Left craniotomy for tumor resection;  Surgeon: Judith Part, MD;  Location: [place];  Service: Neurosurgery;  Laterality: Left;  ? ENDOMETRIAL ABLATION    ? HEMORRHOID SURGERY    ? x 3  ? TUBAL LIGATION    ? ? ?HEMATOLOGY/ONCOLOGY HISTORY:  ?Oncology History  ?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)  ?11/21/2020 Initial Diagnosis  ? 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) ?  ?11/21/2020 Cancer Staging  ? Staging form: Brain and Spinal Cord, AJCC 8th Edition ?- Pathologic stage from 11/21/2020: WHO Grade IV - Signed by Ventura Sellers, MD on 11/30/2020 ?Histopathologic type: Glioblastoma ?Stage prefix: Initial diagnosis ?Histologic grading system: 4 grade system ?Extent of surgical resection: Subtotal resection ?Karnofsky performance status: Score 90 ?Seizures at presentation: Present ?Duration of symptoms before diagnosis: Short ?IDH1 mutation: Negative ? ?  ?12/27/2020 - 02/07/2021 Radiation Therapy  ? IMRT and concurrent Temodar [name]) ?  ?03/12/2021 -  Chemotherapy  ? Patient is on Treatment Plan : BRAIN GLIOBLASTOMA Consolidation Temozolomide Days 1-5  q28 Days   ?   ? ? ?ALLERGIES:  has No Known Allergies. ? ?MEDICATIONS:  ?Current Outpatient Medications  ?Medication Sig Dispense Refill  ? amitriptyline (ELAVIL) 50 MG tablet TAKE 1 TABLET BY MOUTH EVERYDAY AT BEDTIME 90 tablet 2  ? amLODipine (NORVASC) 10 MG tablet Take 1 tablet (10 mg total) by mouth daily. 30 tablet 2  ? baclofen 5 MG TABS Take 5 mg by mouth 3 (three) times daily as needed for muscle spasms. 60 tablet 1  ? CVS D3 125 MCG (5000 UT) capsule TAKE 1 CAPSULE (5,000 UNITS TOTAL) BY MOUTH DAILY.  90 capsule 3  ? dexamethasone (DECADRON) 4 MG tablet Take 0.5 tablets (2 mg total) by mouth daily. 60 tablet 1  ? lacosamide (VIMPAT) 50 MG TABS tablet TAKE 1 TABLET BY MOUTH TWICE A DAY 60 tablet 3  ? levETIRAcetam (KEPPRA) 750 MG tablet TAKE 2 TABLETS (1,500 MG TOTAL) BY MOUTH 2 (TWO) TIMES DAILY. 360 tablet 1  ? levothyroxine (SYNTHROID) 100 MCG tablet TAKE 1 TABLET BY MOUTH EVERY DAY 90 tablet 3  ? LORazepam (ATIVAN) 2 MG tablet Take 1 tablet (2 mg total) by mouth every 8 (eight) hours as needed for seizure. 12 tablet 0  ? ondansetron (ZOFRAN) 8 MG tablet Take 1 tablet by mouth twice daily as needed for nausea & vomiting. May take 30-60 minutes prior to Temodar administration if nausea/vomiting occurs. 30 tablet 1  ? Potassium Chloride ER 20 MEQ TBCR TAKE 1 TABLET BY MOUTH EVERY DAY 90 tablet 3  ? temozolomide (TEMODAR) 100 MG capsule Take 3 capsules (300 mg total) by mouth daily. Take for 5 days on, 23 days off. Repeat every 28 days. May take on an empty stomach to decrease nausea & vomiting. (Patient not taking: Reported on 06/26/2021) 15 capsule 0  ? temozolomide (TEMODAR) 100 MG capsule Take 3 capsules (300 mg total) by mouth daily. Take for 5 days on, 23 days off. Repeat every 28 days. May take on an empty stomach to decrease nausea & vomiting. 15 capsule 0  ? ?No current facility-administered medications for this visit.  ? ? ?VITAL SIGNS: ?BP 140/86 (BP Location: Left Arm, Patient Position: Sitting)   Pulse (!) 122   Temp 98.3 ?F (36.8 ?C) (Temporal)   Resp 17   Wt 188 lb (85.3 kg)   SpO2 94%   BMI 28.59 kg/m?  ?Filed Weights  ? 07/26/21 1436  ?Weight: 188 lb (85.3 kg)  ?  ?Estimated body mass index is 28.59 kg/m? as calculated from the following: ?  Height as of 06/26/21: 5' 8" (1.727 m). ?  Weight as of this encounter: 188 lb (85.3 kg). ? ?LABS: ?CBC: ?   ?Component Value Date/Time  ? WBC 4.2 07/26/2021 1412  ? WBC 13.5 (H) 12/24/2020 1728  ? HGB 14.4 07/26/2021 1412  ? HCT 42.3 07/26/2021 1412  ?  PLT 152 07/26/2021 1412  ? MCV 90.4 07/26/2021 1412  ? NEUTROABS 2.7 07/26/2021 1412  ? LYMPHSABS 1.1 07/26/2021 1412  ? MONOABS 0.3 07/26/2021 1412  ? EOSABS 0.1 07/26/2021 1412  ? BASOSABS 0.0 07/26/2021 1412  ? ?Comprehensive Metabolic Panel: ?   ?Component Value Date/Time  ? NA 136 07/26/2021 1412  ? K 3.3 (L) 07/26/2021 1412  ? CL 101 07/26/2021 1412  ? CO2 25 07/26/2021 1412  ? BUN 21 (H) 07/26/2021 1412  ? CREATININE 1.08 (H) 07/26/2021 1412  ? CREATININE 0.92 01/25/2020 1424  ? GLUCOSE 312 (H) 07/26/2021 1412  ?   CALCIUM 9.4 07/26/2021 1412  ? AST 14 (L) 07/26/2021 1412  ? ALT 31 07/26/2021 1412  ? ALKPHOS 155 (H) 07/26/2021 1412  ? BILITOT 1.5 (H) 07/26/2021 1412  ? PROT 6.5 07/26/2021 1412  ? ALBUMIN 3.9 07/26/2021 1412  ? ? ?RADIOGRAPHIC STUDIES: ?No results found. ? ?PERFORMANCE STATUS (ECOG) : 1 - Symptomatic but completely ambulatory ? ?Review of Systems  ?Constitutional:  Positive for fatigue.  ?     Bilateral hand swelling   ?Skin:   ?     Darkened area on back  ?Unless otherwise noted, a complete review of systems is negative. ? ?Physical Exam ?General: NAD ?Cardiovascular: Tachycardic ?Pulmonary: clear ant fields ?Abdomen: soft, nontender, + bowel sounds ?Extremities: bilateral hand swelling ?Skin: upper back skin dryness and darkened area  ?Neurological: Weakness but otherwise nonfocal ? ?IMPRESSION: ?This is my initial visit with [name]. She also called her Bishop to be involved in discussions. She shares her preference is to have support at all times in case she doesn't think of something to say or miss any information. Acknowledged request. No acute distress noted. Ambulatory with no assistive devices.  ? ?I introduced myself, Hannah RN, and Palliative's role in collaboration with the oncology team. Concept of Palliative Care was introduced as specialized medical care for people and their families living with serious illness.  It focuses on providing relief from the symptoms and stress of a  serious illness.  The goal is to improve quality of life for both the patient and the family. Values and goals of care important to patient and family were attempted to be elicited.  ? ?[name] lives in the

## 2021-07-30 ENCOUNTER — Inpatient Hospital Stay: Payer: BC Managed Care – PPO

## 2021-07-30 ENCOUNTER — Other Ambulatory Visit: Payer: Self-pay | Admitting: Genetic Counselor

## 2021-07-30 ENCOUNTER — Inpatient Hospital Stay (HOSPITAL_BASED_OUTPATIENT_CLINIC_OR_DEPARTMENT_OTHER): Payer: BC Managed Care – PPO | Admitting: Internal Medicine

## 2021-07-30 ENCOUNTER — Inpatient Hospital Stay: Payer: BC Managed Care – PPO | Admitting: Nurse Practitioner

## 2021-07-30 ENCOUNTER — Other Ambulatory Visit: Payer: Self-pay

## 2021-07-30 VITALS — BP 145/98 | HR 96 | Temp 98.3°F | Resp 17 | Wt 183.8 lb

## 2021-07-30 DIAGNOSIS — C259 Malignant neoplasm of pancreas, unspecified: Secondary | ICD-10-CM | POA: Diagnosis not present

## 2021-07-30 DIAGNOSIS — Z800 Family history of malignant neoplasm of digestive organs: Secondary | ICD-10-CM | POA: Diagnosis not present

## 2021-07-30 DIAGNOSIS — K8689 Other specified diseases of pancreas: Secondary | ICD-10-CM

## 2021-07-30 DIAGNOSIS — C712 Malignant neoplasm of temporal lobe: Secondary | ICD-10-CM | POA: Diagnosis not present

## 2021-07-30 DIAGNOSIS — C719 Malignant neoplasm of brain, unspecified: Secondary | ICD-10-CM

## 2021-07-30 DIAGNOSIS — R569 Unspecified convulsions: Secondary | ICD-10-CM

## 2021-07-30 DIAGNOSIS — Z801 Family history of malignant neoplasm of trachea, bronchus and lung: Secondary | ICD-10-CM | POA: Diagnosis not present

## 2021-07-30 DIAGNOSIS — A800 Acute paralytic poliomyelitis, vaccine-associated: Secondary | ICD-10-CM | POA: Diagnosis not present

## 2021-07-30 DIAGNOSIS — D378 Neoplasm of uncertain behavior of other specified digestive organs: Secondary | ICD-10-CM | POA: Diagnosis not present

## 2021-07-30 DIAGNOSIS — I10 Essential (primary) hypertension: Secondary | ICD-10-CM | POA: Diagnosis not present

## 2021-07-30 DIAGNOSIS — K869 Disease of pancreas, unspecified: Secondary | ICD-10-CM | POA: Diagnosis not present

## 2021-07-30 DIAGNOSIS — Z90710 Acquired absence of both cervix and uterus: Secondary | ICD-10-CM | POA: Diagnosis not present

## 2021-07-30 LAB — CBC WITH DIFFERENTIAL (CANCER CENTER ONLY)
Abs Immature Granulocytes: 0.12 10*3/uL — ABNORMAL HIGH (ref 0.00–0.07)
Basophils Absolute: 0 10*3/uL (ref 0.0–0.1)
Basophils Relative: 0 %
Eosinophils Absolute: 0 10*3/uL (ref 0.0–0.5)
Eosinophils Relative: 0 %
HCT: 42.9 % (ref 36.0–46.0)
Hemoglobin: 14.5 g/dL (ref 12.0–15.0)
Immature Granulocytes: 2 %
Lymphocytes Relative: 20 %
Lymphs Abs: 1 10*3/uL (ref 0.7–4.0)
MCH: 30.9 pg (ref 26.0–34.0)
MCHC: 33.8 g/dL (ref 30.0–36.0)
MCV: 91.3 fL (ref 80.0–100.0)
Monocytes Absolute: 0.4 10*3/uL (ref 0.1–1.0)
Monocytes Relative: 8 %
Neutro Abs: 3.5 10*3/uL (ref 1.7–7.7)
Neutrophils Relative %: 70 %
Platelet Count: 166 10*3/uL (ref 150–400)
RBC: 4.7 MIL/uL (ref 3.87–5.11)
RDW: 12.2 % (ref 11.5–15.5)
WBC Count: 5 10*3/uL (ref 4.0–10.5)
nRBC: 0 % (ref 0.0–0.2)

## 2021-07-30 LAB — CMP (CANCER CENTER ONLY)
ALT: 35 U/L (ref 0–44)
AST: 10 U/L — ABNORMAL LOW (ref 15–41)
Albumin: 4.3 g/dL (ref 3.5–5.0)
Alkaline Phosphatase: 240 U/L — ABNORMAL HIGH (ref 38–126)
Anion gap: 9 (ref 5–15)
BUN: 16 mg/dL (ref 6–20)
CO2: 30 mmol/L (ref 22–32)
Calcium: 9.7 mg/dL (ref 8.9–10.3)
Chloride: 98 mmol/L (ref 98–111)
Creatinine: 0.95 mg/dL (ref 0.44–1.00)
GFR, Estimated: >60 mL/min (ref >60)
Glucose, Bld: 450 mg/dL — ABNORMAL HIGH (ref 70–99)
Potassium: 3.8 mmol/L (ref 3.5–5.1)
Sodium: 137 mmol/L (ref 135–145)
Total Bilirubin: 0.9 mg/dL (ref 0.3–1.2)
Total Protein: 7 g/dL (ref 6.5–8.1)

## 2021-07-30 LAB — GENETIC SCREENING ORDER

## 2021-07-30 MED ORDER — TEMOZOLOMIDE 100 MG PO CAPS: 150.0000 mg/m2/d | ORAL_CAPSULE | Freq: Every day | ORAL | 0 refills | Status: DC

## 2021-07-30 NOTE — Progress Notes (Signed)
? ?[facility] [address]  ?[place], [place] [address] ?[phone] ? ? ?Interval Evaluation ? ?Date of Service: 07/30/21 ?Patient Name: [name] ?Patient MRN: [id number] ?Patient DOB: September 01, 1965 ?Provider: Ventura Sellers, MD ? ?Identifying Statement:  ?[name] is a 56 y.o. female with left temporal glioblastoma  ? ?Oncologic History: ?Oncology History  ?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)  ?11/21/2020 Initial Diagnosis  ? 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) ?  ?11/21/2020 Cancer Staging  ? Staging form: Brain and Spinal Cord, AJCC 8th Edition ?- Pathologic stage from 11/21/2020: WHO Grade IV - Signed by Ventura Sellers, MD on 11/30/2020 ?Histopathologic type: Glioblastoma ?Stage prefix: Initial diagnosis ?Histologic grading system: 4 grade system ?Extent of surgical resection: Subtotal resection ?Karnofsky performance status: Score 90 ?Seizures at presentation: Present ?Duration of symptoms before diagnosis: Short ?IDH1 mutation: Negative ? ?  ?12/27/2020 - 02/07/2021 Radiation Therapy  ? IMRT and concurrent Temodar [facility]) ?  ?03/12/2021 -  Chemotherapy  ? Patient is on Treatment Plan : BRAIN GLIOBLASTOMA Consolidation Temozolomide Days 1-5 q28 Days   ?   ? ? ?Biomarkers: ? ?MGMT Methylated.  ?IDH 1/2 Wild type.  ?EGFR Unknown  ?TERT Unknown  ? ?Interval History: ?[name] presents today for follow up with MRI brain for evaluation following cycle #4 of Temodar.  No new or progressive deficits today.  She did have an episode of right hand numbness, lasting for several hours.  She does describe dry mouth, new this month.  Headaches are stable.  Short term memory impairment and fatigue are still persistent.   ?   ? ?H+P (11/25/20) Patient presented to medical attention this past month with first ever seizure.  She describes episode of sudden onset "disconnection" with tongue biting and urination,  followed by period of confusion.  CNS imaging demonstrated enhancing mass within left anterior temporal lobe, which was mostly resected by Dr. Zada Finders on 11/21/20.  Since surgery she had not had recurrence of seizures.  She complains of painful cramping of her right leg, which started ~1 week prior.  At this time the cramping is her main complaint.  Otherwise fully functional, independent, would like to return to work if possible. ? ?Medications: ?Current Outpatient Medications on File Prior to Visit  ?Medication Sig Dispense Refill  ? amitriptyline (ELAVIL) 50 MG tablet TAKE 1 TABLET BY MOUTH EVERYDAY AT BEDTIME 90 tablet 2  ? amLODipine (NORVASC) 10 MG tablet Take 1 tablet (10 mg total) by mouth daily. 30 tablet 2  ? baclofen 5 MG TABS Take 5 mg by mouth 3 (three) times daily as needed for muscle spasms. 60 tablet 1  ? CVS D3 125 MCG (5000 UT) capsule TAKE 1 CAPSULE (5,000 UNITS TOTAL) BY MOUTH DAILY. 90 capsule 3  ? dexamethasone (DECADRON) 4 MG tablet Take 0.5 tablets (2 mg total) by mouth daily. 60 tablet 1  ? lacosamide (VIMPAT) 50 MG TABS tablet TAKE 1 TABLET BY MOUTH TWICE A DAY 60 tablet 3  ? levETIRAcetam (KEPPRA) 750 MG tablet TAKE 2 TABLETS (1,500 MG TOTAL) BY MOUTH 2 (TWO) TIMES DAILY. 360 tablet 1  ? levothyroxine (SYNTHROID) 100 MCG tablet TAKE 1 TABLET BY MOUTH EVERY DAY 90 tablet 3  ? LORazepam (ATIVAN) 2 MG tablet Take 1 tablet (2 mg total) by mouth every 8 (eight) hours as needed for seizure. 12 tablet 0  ? ondansetron (ZOFRAN) 8 MG tablet Take 1 tablet by mouth twice  daily as needed for nausea & vomiting. May take 30-60 minutes prior to Temodar administration if nausea/vomiting occurs. 30 tablet 1  ? Potassium Chloride ER 20 MEQ TBCR TAKE 1 TABLET BY MOUTH EVERY DAY 90 tablet 3  ? temozolomide (TEMODAR) 100 MG capsule Take 3 capsules (300 mg total) by mouth daily. Take for 5 days on, 23 days off. Repeat every 28 days. May take on an empty stomach to decrease nausea & vomiting. (Patient not  taking: Reported on 06/26/2021) 15 capsule 0  ? temozolomide (TEMODAR) 100 MG capsule Take 3 capsules (300 mg total) by mouth daily. Take for 5 days on, 23 days off. Repeat every 28 days. May take on an empty stomach to decrease nausea & vomiting. 15 capsule 0  ? ?No current facility-administered medications on file prior to visit.  ? ? ?Allergies: No Known Allergies ?Past Medical History:  ?Past Medical History:  ?Diagnosis Date  ? Bell's palsy   ? left side of face- notied in smile and eyes, if very tired  ? Brain cancer [facility])   ? Complication of anesthesia   ? N/V  ? Essential hypertension   ? External hemorrhoids   ? History of blood transfusion   ? post op Hemorroids  ? History of pneumonia   ? Hyperlipidemia   ? Hyperthyroidism   ? hx 56 years old- oral meds  ? Iron deficiency anemia   ? Migraine headache   ? Pneumonia   ? x 2 - years ago  ? PONV (postoperative nausea and vomiting)   ? PPD positive   ? Pulmonary nodules   ? not seen on plain cxr, first detected 1/07 re ct 10/08 pos IPPD > 15 mm 12/09 FOB/lavage 04/20/08 neg afb smear  ? PVC's (premature ventricular contractions)   ? Seizure ([place]) 11/15/2020  ? Sleep apnea   ? refused CPAP- mouth piece recommended  ? Tuberculosis   ? in her late 64's  ? ?Past Surgical History:  ?Past Surgical History:  ?Procedure Laterality Date  ? ABDOMINAL HYSTERECTOMY    ? APPLICATION OF CRANIAL NAVIGATION N/A 11/21/2020  ? Procedure: APPLICATION OF CRANIAL NAVIGATION;  Surgeon: Judith Part, MD;  Location: [place];  Service: Neurosurgery;  Laterality: N/A;  ? BREAST BIOPSY Right   ? CHOLECYSTECTOMY  2014  ? COLONOSCOPY  05/04/2009  ? prolapsing external hemorrhoids  ? CRANIOTOMY Left 11/21/2020  ? Procedure: Left craniotomy for tumor resection;  Surgeon: Judith Part, MD;  Location: [place];  Service: Neurosurgery;  Laterality: Left;  ? ENDOMETRIAL ABLATION    ? HEMORRHOID SURGERY    ? x 3  ? TUBAL LIGATION    ? ?Social History:  ?Social History  ? ?Socioeconomic  History  ? Marital status: Divorced  ?  Spouse name: Not on file  ? Number of children: 2  ? Years of education: Not on file  ? Highest education level: Associate degree: occupational, Hotel manager, or vocational program  ?Occupational History  ? Occupation: accounts billing  ?  Employer: [employer]  ?Tobacco Use  ? Smoking status: Never  ? Smokeless tobacco: Never  ?Vaping Use  ? Vaping Use: Never used  ?Substance and Sexual Activity  ? Alcohol use: No  ?  Alcohol/week: 0.0 standard drinks  ? Drug use: No  ? Sexual activity: Not on file  ?Other Topics Concern  ? Not on file  ?Social History Narrative  ? Divorced with 2 children  ? She works as a Librarian, 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[employer]   ?  Possible TB exposure 2008 (after nodules found)  ? From [place] lived there and [place]  ? Never owned birds  ?   ? ?Social Determinants of Health  ? ?Financial Resource Strain: Medium Risk  ? Difficulty of Paying Living Expenses: Somewhat hard  ?Food Insecurity: Food Insecurity Present  ? Worried About Charity fundraiser in the Last Year: Sometimes true  ? Ran Out of Food in the Last Year: Never true  ?Transportation Needs: No Transportation Needs  ? Lack of Transportation (Medical): No  ? Lack of Transportation (Non-Medical): No  ?Physical Activity: Unknown  ? Days of Exercise per Week: 0 days  ? Minutes of Exercise per Session: Not on file  ?Stress: No Stress Concern Present  ? Feeling of Stress : Only a little  ?Social Connections: Moderately Integrated  ? Frequency of Communication with Friends and Family: Twice a week  ? Frequency of Social Gatherings with Friends and Family: Twice a week  ? Attends Religious Services: More than 4 times per year  ? Active Member of Clubs or Organizations: Yes  ? Attends Archivist Meetings: More than 4 times per year  ? Marital Status: Divorced  ?Intimate Partner Violence: Not on file  ? ?Family History:  ?Family History  ?Problem Relation Age of Onset  ? Diabetes type II Mother   ? Kidney failure Sister   ?  Diabetes Mellitus II Sister   ? Colon cancer Maternal Aunt   ?     aunts and uncles  ? Diabetes Mellitus II Maternal Aunt   ? Cancer Maternal Aunt   ?     pancreas  ? Colon cancer Maternal Uncle   ? Diabetes Mellitus

## 2021-07-31 DIAGNOSIS — K8689 Other specified diseases of pancreas: Secondary | ICD-10-CM | POA: Insufficient documentation

## 2021-07-31 LAB — CANCER ANTIGEN 19-9: CA 19-9: 17 U/mL (ref 0–35)

## 2021-08-01 ENCOUNTER — Other Ambulatory Visit: Payer: Self-pay

## 2021-08-01 ENCOUNTER — Inpatient Hospital Stay (HOSPITAL_BASED_OUTPATIENT_CLINIC_OR_DEPARTMENT_OTHER): Payer: BC Managed Care – PPO | Admitting: Hematology

## 2021-08-01 DIAGNOSIS — I10 Essential (primary) hypertension: Secondary | ICD-10-CM | POA: Diagnosis not present

## 2021-08-01 DIAGNOSIS — D378 Neoplasm of uncertain behavior of other specified digestive organs: Secondary | ICD-10-CM | POA: Diagnosis not present

## 2021-08-01 DIAGNOSIS — K8689 Other specified diseases of pancreas: Secondary | ICD-10-CM

## 2021-08-01 DIAGNOSIS — C712 Malignant neoplasm of temporal lobe: Secondary | ICD-10-CM | POA: Diagnosis not present

## 2021-08-01 DIAGNOSIS — Z801 Family history of malignant neoplasm of trachea, bronchus and lung: Secondary | ICD-10-CM | POA: Diagnosis not present

## 2021-08-01 DIAGNOSIS — Z90710 Acquired absence of both cervix and uterus: Secondary | ICD-10-CM | POA: Diagnosis not present

## 2021-08-01 DIAGNOSIS — K869 Disease of pancreas, unspecified: Secondary | ICD-10-CM | POA: Diagnosis not present

## 2021-08-01 DIAGNOSIS — Z800 Family history of malignant neoplasm of digestive organs: Secondary | ICD-10-CM | POA: Diagnosis not present

## 2021-08-01 NOTE — Addendum Note (Signed)
Addended by: Truitt Merle on: 08/01/2021 03:47 PM ? ? Modules accepted: Orders ? ?

## 2021-08-01 NOTE — Progress Notes (Signed)
?[facility]   ?[phone] [phone]   ?Clinic Follow up Note  ? ?Patient Care Team: ?[name], NP as PCP - General (Family Medicine) ?Jacki Cones, MD as Referring Physician (Gastroenterology) ?Truitt Merle, MD as Consulting Physician (Oncology) ?Ventura Sellers, MD as Consulting Physician (Oncology) ? ?Date of Service:  08/01/2021 ? ?CHIEF COMPLAINT: f/u of pancreatic mass ? ?CURRENT THERAPY:  ?Surveillance ? ?ASSESSMENT & PLAN:  ?[name] is a 56 y.o. female with  ? ?1. Mass in head of pancreas ?-incidental finding during work up for new onset seizure. CT AP on 11/15/20 showed a 1.2 cm mass in the pancreatic head/uncinate process. Further work up was help due to during treatment of her glioblastoma. ?-Due to abdominal cramping and patient request, she underwent repeat CT CAP 05/25/21 which showed mild enlargement of the lesion now measuring 1.6  cm, with less than 180 degree abutment of the SMV and no involvement of the celiac or SMA.  No evidence of metastatic disease in the abdomen or pelvis. ?-baseline CA 19.9 and chromogranin A on 06/11/21 were WNL, repeated CA19.9 this week was also normal  ?-We discussed her case in our GI conference, we feel her pancreatic mass is probably not adenocarcinoma, likely benign or neuroendocrine tumor. Biopsy was held due to her GBM treatment  ?-her BG has increase significantly lately, but she is clinically stable without new abdominal symptoms  ?-will repeat CT AP scan in next 2-3 weeks and decide if we proceed biopsy or not  ?  ?2. Glioblastoma, WHO grade IV, IDH 1/2 wild-type ?-She presented to the ED 11/15/20 with new onset seizures, work-up showed large enhancing left temporal mass. ?-S/p left craniotomy 11/21/20 by Dr. Emelda Brothers who achieved near-total resection ?-S/p adjuvant IMRT (Dr. Isidore Moos) and concurrent Temodar 12/27/20 - 02/07/21 ?-Currently on consolidation Temodar days 1-5 q. 28 days, followed by Dr.  Mickeal Skinner ?-most recent brain MRI on 07/27/21 showed: mild decrease in size of left insula mass; new wispy and nodular areas of restricted diffusion and enhancement in left frontal lobe. ?-she continues Temodar under Dr. Renda Rolls care  ?  ?3. Genetics  ?-She has a personal history of GBM and family history of pancreatic, lung, and colon cancer's, she would likely benefit from genetic testing especially if found to have second malignancy ?  ?4. Social  ?-Ms. Sol is single, 2 children, works full-time. She has strong family support  ?-GI navigator has referred her to social work for Manufacturing systems engineer and nutrition ?  ? ?PLAN: ?-continue f/u and treatment per Dr. Mickeal Skinner ?-CT AP w wo contrast pancreatic protocol in 2-3 weeks, will call her with results  ? ? ?No problem-specific Assessment & Plan notes found for this encounter. ? ? ?SUMMARY OF ONCOLOGIC HISTORY: ?Oncology History  ?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)  ?11/21/2020 Initial Diagnosis  ? 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) ?  ?11/21/2020 Cancer Staging  ? Staging form: Brain and Spinal Cord, AJCC 8th Edition ?- Pathologic stage from 11/21/2020: WHO Grade IV - Signed by Ventura Sellers, MD on 11/30/2020 ?Histopathologic type: Glioblastoma ?Stage prefix: Initial diagnosis ?Histologic grading system: 4 grade system ?Extent of surgical resection: Subtotal resection ?Karnofsky performance status: Score 90 ?Seizures at presentation: Present ?Duration of symptoms before diagnosis: Short ?IDH1 mutation: Negative ? ?  ?12/27/2020 - 02/07/2021 Radiation Therapy  ? IMRT and concurrent Temodar Isidore Moos) ?  ?03/12/2021 -  Chemotherapy  ? Patient is on Treatment Plan : BRAIN  GLIOBLASTOMA Consolidation Temozolomide Days 1-5 q28 Days   ?   ? ? ? ?INTERVAL HISTORY:  ?[name] is here for a follow up of pancreas mass. She was last seen by me on 06/11/21 in consultation with NP Lacie. She presents to the clinic alone. ?She  reports she is doing well overall. ?  ?All other systems were reviewed with the patient and are negative. ? ?MEDICAL HISTORY:  ?Past Medical History:  ?Diagnosis Date  ? Bell's palsy   ? left side of face- notied in smile and eyes, if very tired  ? Brain cancer [facility])   ? Complication of anesthesia   ? N/V  ? Essential hypertension   ? External hemorrhoids   ? History of blood transfusion   ? post op Hemorroids  ? History of pneumonia   ? Hyperlipidemia   ? Hyperthyroidism   ? hx 56 years old- oral meds  ? Iron deficiency anemia   ? Migraine headache   ? Pneumonia   ? x 2 - years ago  ? PONV (postoperative nausea and vomiting)   ? PPD positive   ? Pulmonary nodules   ? not seen on plain cxr, first detected 1/07 re ct 10/08 pos IPPD > 15 mm 12/09 FOB/lavage 04/20/08 neg afb smear  ? PVC's (premature ventricular contractions)   ? Seizure ([place]) 11/15/2020  ? Sleep apnea   ? refused CPAP- mouth piece recommended  ? Tuberculosis   ? in her late 31's  ? ? ?SURGICAL HISTORY: ?Past Surgical History:  ?Procedure Laterality Date  ? ABDOMINAL HYSTERECTOMY    ? APPLICATION OF CRANIAL NAVIGATION N/A 11/21/2020  ? Procedure: APPLICATION OF CRANIAL NAVIGATION;  Surgeon: Judith Part, MD;  Location: [place];  Service: Neurosurgery;  Laterality: N/A;  ? BREAST BIOPSY Right   ? CHOLECYSTECTOMY  2014  ? COLONOSCOPY  05/04/2009  ? prolapsing external hemorrhoids  ? CRANIOTOMY Left 11/21/2020  ? Procedure: Left craniotomy for tumor resection;  Surgeon: Judith Part, MD;  Location: [place];  Service: Neurosurgery;  Laterality: Left;  ? ENDOMETRIAL ABLATION    ? HEMORRHOID SURGERY    ? x 3  ? TUBAL LIGATION    ? ? ?I have reviewed the social history and family history with the patient and they are unchanged from previous note. ? ?ALLERGIES:  has No Known Allergies. ? ?MEDICATIONS:  ?Current Outpatient Medications  ?Medication Sig Dispense Refill  ? amitriptyline (ELAVIL) 50 MG tablet TAKE 1 TABLET BY MOUTH EVERYDAY AT BEDTIME 90  tablet 2  ? amLODipine (NORVASC) 10 MG tablet Take 1 tablet (10 mg total) by mouth daily. 30 tablet 2  ? baclofen 5 MG TABS Take 5 mg by mouth 3 (three) times daily as needed for muscle spasms. 60 tablet 1  ? [employer] D3 125 MCG (5000 UT) capsule TAKE 1 CAPSULE (5,000 UNITS TOTAL) BY MOUTH DAILY. 90 capsule 3  ? dexamethasone (DECADRON) 4 MG tablet Take 0.5 tablets (2 mg total) by mouth daily. 60 tablet 1  ? lacosamide (VIMPAT) 50 MG TABS tablet TAKE 1 TABLET BY MOUTH TWICE A DAY 60 tablet 3  ? levETIRAcetam (KEPPRA) 750 MG tablet TAKE 2 TABLETS (1,500 MG TOTAL) BY MOUTH 2 (TWO) TIMES DAILY. 360 tablet 1  ? levothyroxine (SYNTHROID) 100 MCG tablet TAKE 1 TABLET BY MOUTH EVERY DAY 90 tablet 3  ? LORazepam (ATIVAN) 2 MG tablet Take 1 tablet (2 mg total) by mouth every 8 (eight) hours as needed for seizure. (Patient  not taking: Reported on 07/30/2021) 12 tablet 0  ? ondansetron (ZOFRAN) 8 MG tablet Take 1 tablet by mouth twice daily as needed for nausea & vomiting. May take 30-60 minutes prior to Temodar administration if nausea/vomiting occurs. 30 tablet 1  ? Potassium Chloride ER 20 MEQ TBCR TAKE 1 TABLET BY MOUTH EVERY DAY 90 tablet 3  ? temozolomide (TEMODAR) 100 MG capsule Take 3 capsules (300 mg total) by mouth daily. Take for 5 days on, 23 days off. Repeat every 28 days. May take on an empty stomach to decrease nausea & vomiting. (Patient not taking: Reported on 07/30/2021) 15 capsule 0  ? temozolomide (TEMODAR) 100 MG capsule Take 3 capsules (300 mg total) by mouth daily. Take for 5 days on, 23 days off. Repeat every 28 days. May take on an empty stomach to decrease nausea & vomiting. 15 capsule 0  ? ?No current facility-administered medications for this visit.  ? ? ?PHYSICAL EXAMINATION: ?ECOG PERFORMANCE STATUS: 1 - Symptomatic but completely ambulatory ? ?Vitals:  ? 08/01/21 1135  ?BP: (!) 139/93  ?Pulse: 98  ?Resp: 18  ?Temp: 98.2 ?F (36.8 ?C)  ?SpO2: 99%  ? ?Wt Readings from Last 3 Encounters:  ?08/01/21 182 lb  12.8 oz (82.9 kg)  ?07/30/21 183 lb 12.8 oz (83.4 kg)  ?07/26/21 188 lb (85.3 kg)  ?  ? ?GENERAL:alert, no distress and comfortable ?SKIN: skin color, texture, turgor are normal, no rashes or significant lesions ?

## 2021-08-02 ENCOUNTER — Telehealth: Payer: Self-pay | Admitting: Internal Medicine

## 2021-08-02 ENCOUNTER — Other Ambulatory Visit: Payer: BC Managed Care – PPO

## 2021-08-02 ENCOUNTER — Ambulatory Visit: Payer: BC Managed Care – PPO | Admitting: Hematology

## 2021-08-02 ENCOUNTER — Inpatient Hospital Stay (HOSPITAL_BASED_OUTPATIENT_CLINIC_OR_DEPARTMENT_OTHER): Payer: BC Managed Care – PPO | Admitting: Genetic Counselor

## 2021-08-02 DIAGNOSIS — C719 Malignant neoplasm of brain, unspecified: Secondary | ICD-10-CM

## 2021-08-02 DIAGNOSIS — Z801 Family history of malignant neoplasm of trachea, bronchus and lung: Secondary | ICD-10-CM | POA: Diagnosis not present

## 2021-08-02 DIAGNOSIS — Z800 Family history of malignant neoplasm of digestive organs: Secondary | ICD-10-CM

## 2021-08-02 NOTE — Telephone Encounter (Signed)
.  Called patient to schedule appointment per 3/30 inbasket, patient is aware of date and time.   ?

## 2021-08-03 ENCOUNTER — Encounter: Payer: Self-pay | Admitting: Genetic Counselor

## 2021-08-03 ENCOUNTER — Encounter: Payer: Self-pay | Admitting: Internal Medicine

## 2021-08-03 DIAGNOSIS — Z800 Family history of malignant neoplasm of digestive organs: Secondary | ICD-10-CM | POA: Insufficient documentation

## 2021-08-03 NOTE — Progress Notes (Signed)
REFERRING PROVIDER: ?Truitt Merle, MD ?[place] ?[place], [place] [address] ? ?PRIMARY PROVIDER:  ?[name], NP ? ?PRIMARY REASON FOR VISIT:  ?1. 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)   ?2. Family history of colon cancer   ? ? ?HISTORY OF PRESENT ILLNESS:   ?[name], a 56 y.o. female, was seen for a [place] cancer genetics consultation at the request of Dr. Burr Medico due to a personal and family history of cancer.  [name] presents to clinic today to discuss the possibility of a hereditary predisposition to cancer, to discuss genetic testing, and to further clarify her future cancer risks, as well as potential cancer risks for family members.  ? ?[name] was diagnosed with a glioblastoma at age 64.  ? ?CANCER HISTORY:  ?Oncology History  ?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)  ?11/21/2020 Initial Diagnosis  ? 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) ?  ?11/21/2020 Cancer Staging  ? Staging form: Brain and Spinal Cord, AJCC 8th Edition ?- Pathologic stage from 11/21/2020: WHO Grade IV - Signed by Ventura Sellers, MD on 11/30/2020 ?Histopathologic type: Glioblastoma ?Stage prefix: Initial diagnosis ?Histologic grading system: 4 grade system ?Extent of surgical resection: Subtotal resection ?Karnofsky performance status: Score 90 ?Seizures at presentation: Present ?Duration of symptoms before diagnosis: Short ?IDH1 mutation: Negative ? ?  ?12/27/2020 - 02/07/2021 Radiation Therapy  ? IMRT and concurrent Temodar Isidore Moos) ?  ?03/12/2021 -  Chemotherapy  ? Patient is on Treatment Plan : BRAIN GLIOBLASTOMA Consolidation Temozolomide Days 1-5 q28 Days   ?   ? ? ? ?RISK FACTORS:  ?First live birth at age 38.  ?Ovaries intact: yes.  ?Uterus intact: no.  ?HRT use: 0 years. ?Colonoscopy: yes, reports 1 benign polyp ? ?Past Medical History:  ?Diagnosis Date  ? Bell's palsy   ? left side of face- notied in smile and eyes, if very tired  ?  Brain cancer [facility])   ? Complication of anesthesia   ? N/V  ? Essential hypertension   ? External hemorrhoids   ? History of blood transfusion   ? post op Hemorroids  ? History of pneumonia   ? Hyperlipidemia   ? Hyperthyroidism   ? hx 56 years old- oral meds  ? Iron deficiency anemia   ? Migraine headache   ? Pneumonia   ? x 2 - years ago  ? PONV (postoperative nausea and vomiting)   ? PPD positive   ? Pulmonary nodules   ? not seen on plain cxr, first detected 1/07 re ct 10/08 pos IPPD > 15 mm 12/09 FOB/lavage 04/20/08 neg afb smear  ? PVC's (premature ventricular contractions)   ? Seizure ([place]) 11/15/2020  ? Sleep apnea   ? refused CPAP- mouth piece recommended  ? Tuberculosis   ? in her late 68's  ? ? ?Past Surgical History:  ?Procedure Laterality Date  ? ABDOMINAL HYSTERECTOMY    ? APPLICATION OF CRANIAL NAVIGATION N/A 11/21/2020  ? Procedure: APPLICATION OF CRANIAL NAVIGATION;  Surgeon: Judith Part, MD;  Location: [place];  Service: Neurosurgery;  Laterality: N/A;  ? BREAST BIOPSY Right   ? CHOLECYSTECTOMY  2014  ? COLONOSCOPY  05/04/2009  ? prolapsing external hemorrhoids  ? CRANIOTOMY Left 11/21/2020  ? Procedure: Left craniotomy for tumor resection;  Surgeon: Judith Part, MD;  Location: [place];  Service: Neurosurgery;  Laterality: Left;  ? ENDOMETRIAL ABLATION    ? HEMORRHOID SURGERY    ? x 3  ?  TUBAL LIGATION    ? ? ?Social History  ? ?Socioeconomic History  ? Marital status: Divorced  ?  Spouse name: Not on file  ? Number of children: 2  ? Years of education: Not on file  ? Highest education level: Associate degree: occupational, Hotel manager, or vocational program  ?Occupational History  ? Occupation: accounts billing  ?  Employer: [employer]  ?Tobacco Use  ? Smoking status: Never  ? Smokeless tobacco: Never  ?Vaping Use  ? Vaping Use: Never used  ?Substance and Sexual Activity  ? Alcohol use: No  ?  Alcohol/week: 0.0 standard drinks  ? Drug use: No  ? Sexual activity: Not on file  ?Other Topics  Concern  ? Not on file  ?Social History Narrative  ? Divorced with 2 children  ? She works as a Librarian, 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Press photographer   ? Possible TB exposure 2008 (after nodules found)  ? From [place] lived there and [place]  ? Never owned birds  ?   ? ?Social Determinants of Health  ? ?Financial Resource Strain: Medium Risk  ? Difficulty of Paying Living Expenses: Somewhat hard  ?Food Insecurity: Food Insecurity Present  ? Worried About Charity fundraiser in the Last Year: Sometimes true  ? Ran Out of Food in the Last Year: Never true  ?Transportation Needs: No Transportation Needs  ? Lack of Transportation (Medical): No  ? Lack of Transportation (Non-Medical): No  ?Physical Activity: Unknown  ? Days of Exercise per Week: 0 days  ? Minutes of Exercise per Session: Not on file  ?Stress: No Stress Concern Present  ? Feeling of Stress : Only a little  ?Social Connections: Moderately Integrated  ? Frequency of Communication with Friends and Family: Twice a week  ? Frequency of Social Gatherings with Friends and Family: Twice a week  ? Attends Religious Services: More than 4 times per year  ? Active Member of Clubs or Organizations: Yes  ? Attends Archivist Meetings: More than 4 times per year  ? Marital Status: Divorced  ?  ? ?FAMILY HISTORY:  ?We obtained a detailed, 4-generation family history.  Significant diagnoses are listed below: ?Family History  ?Problem Relation Age of Onset  ? Colon cancer Maternal Aunt   ?     aunts and uncles  ? Pancreatic cancer Maternal Aunt   ? Colon cancer Maternal Uncle   ? Lung cancer Other   ?     aunts and uncles  ? ? ? ?[name]'s maternal aunt was diagnosed with colon cancer, she is deceased. She has another maternal aunt diagnosed with pancreatic cancer, she is deceased. Her maternal uncle was diagnosed with colon cancer, he is deceased.  ? ?[name] is unaware of previous family history of genetic testing for hereditary cancer risks. There is no reported Ashkenazi Jewish  ancestry. ? ?GENETIC COUNSELING ASSESSMENT: [name] is a 56 y.o. female with a personal and family history of cancer which is somewhat suggestive of a hereditary predisposition to cancer. We, therefore, discussed and recommended the following at today's visit.  ? ?DISCUSSION: We discussed that 5 - 10% of cancer is hereditary, with some cases of glioblastoma and colon cancer associated with Lynch Syndrome.  There are other genes that can be associated with hereditary glioblastoma and colon cancer syndromes.  We discussed that testing is beneficial for several reasons including knowing how to follow individuals after completing their treatment and understanding if other family members could be at risk for  cancer and allowing them to undergo genetic testing.  ? ?We reviewed the characteristics, features and inheritance patterns of hereditary cancer syndromes. We also discussed genetic testing, including the appropriate family members to test, the process of testing, insurance coverage and turn-around-time for results. We discussed the implications of a negative, positive, carrier and/or variant of uncertain significant result.  ? ?[name] elected to have [facility] CancerNext-Expanded Panel. The CancerNext-Expanded gene panel offered by [facility] and includes sequencing, rearrangement, and RNA analysis for the following 77 genes: AIP, ALK, APC, ATM, AXIN2, BAP1, BARD1, BLM, BMPR1A, BRCA1, BRCA2, BRIP1, CDC73, CDH1, CDK4, CDKN1B, CDKN2A, CHEK2, CTNNA1, DICER1, FANCC, FH, FLCN, GALNT12, KIF1B, LZTR1, MAX, MEN1, MET, MLH1, MSH2, MSH3, MSH6, MUTYH, NBN, NF1, NF2, NTHL1, PALB2, PHOX2B, PMS2, POT1, PRKAR1A, PTCH1, PTEN, RAD51C, RAD51D, RB1, RECQL, RET, SDHA, SDHAF2, SDHB, SDHC, SDHD, SMAD4, SMARCA4, SMARCB1, SMARCE1, STK11, SUFU, TMEM127, TP53, TSC1, TSC2, VHL and XRCC2 (sequencing and deletion/duplication); EGFR, EGLN1, HOXB13, KIT, MITF, PDGFRA, POLD1, and POLE (sequencing only); EPCAM and GREM1 (deletion/duplication  only).  ? ?Based on [name]'s personal and family history of cancer, she meets medical criteria for genetic testing. Despite that she meets criteria, she may still have an out of pocket cost. We discussed that if he

## 2021-08-09 ENCOUNTER — Encounter: Payer: Self-pay | Admitting: Hematology

## 2021-08-13 ENCOUNTER — Other Ambulatory Visit: Payer: Self-pay | Admitting: Radiation Therapy

## 2021-08-19 ENCOUNTER — Other Ambulatory Visit: Payer: Self-pay | Admitting: Internal Medicine

## 2021-08-20 ENCOUNTER — Encounter: Payer: Self-pay | Admitting: Internal Medicine

## 2021-08-24 ENCOUNTER — Encounter: Payer: Self-pay | Admitting: Genetic Counselor

## 2021-08-24 ENCOUNTER — Ambulatory Visit (HOSPITAL_COMMUNITY)
Admission: RE | Admit: 2021-08-24 | Discharge: 2021-08-24 | Disposition: A | Discharge status: Home / Self Care | Payer: BC Managed Care – PPO | Source: Ambulatory Visit | Attending: Internal Medicine | Admitting: Internal Medicine

## 2021-08-24 ENCOUNTER — Ambulatory Visit (HOSPITAL_COMMUNITY)
Admission: RE | Admit: 2021-08-24 | Discharge: 2021-08-24 | Disposition: A | Discharge status: Home / Self Care | Payer: BC Managed Care – PPO | Source: Ambulatory Visit | Attending: Hematology | Admitting: Hematology

## 2021-08-24 ENCOUNTER — Telehealth: Payer: Self-pay | Admitting: Genetic Counselor

## 2021-08-24 DIAGNOSIS — C719 Malignant neoplasm of brain, unspecified: Secondary | ICD-10-CM

## 2021-08-24 DIAGNOSIS — I700 Atherosclerosis of aorta: Secondary | ICD-10-CM | POA: Diagnosis not present

## 2021-08-24 DIAGNOSIS — K8689 Other specified diseases of pancreas: Secondary | ICD-10-CM | POA: Diagnosis not present

## 2021-08-24 DIAGNOSIS — G9389 Other specified disorders of brain: Secondary | ICD-10-CM | POA: Diagnosis not present

## 2021-08-24 DIAGNOSIS — Z1379 Encounter for other screening for genetic and chromosomal anomalies: Secondary | ICD-10-CM | POA: Insufficient documentation

## 2021-08-24 MED ORDER — GADOBUTROL 1 MMOL/ML IV SOLN
7.5000 mL | Freq: Once | INTRAVENOUS | Status: AC | PRN
  Administered 2021-08-24: 7.5 mL via INTRAVENOUS

## 2021-08-24 MED ORDER — IOHEXOL 300 MG/ML  SOLN
100.0000 mL | Freq: Once | INTRAMUSCULAR | Status: AC | PRN
  Administered 2021-08-24: 100 mL via INTRAVENOUS

## 2021-08-24 NOTE — Telephone Encounter (Signed)
I contacted Ms. Scahill to discuss her genetic testing results. No pathogenic variants were identified in the 77 genes analyzed. Of note, a variant of uncertain significance was identified in the MUTYH gene. Detailed clinic note to follow. ? ?The test report has been scanned into EPIC and is located under the Molecular Pathology section of the Results Review tab.  A portion of the result report is included below for reference.  ? ?Lucille Passy, MS, [place] ?Genetic Counselor ?[email or web address] ?(P) [phone] ? ? ?

## 2021-08-27 ENCOUNTER — Inpatient Hospital Stay: Payer: BC Managed Care – PPO

## 2021-08-27 ENCOUNTER — Inpatient Hospital Stay (HOSPITAL_BASED_OUTPATIENT_CLINIC_OR_DEPARTMENT_OTHER): Payer: BC Managed Care – PPO | Admitting: Internal Medicine

## 2021-08-27 ENCOUNTER — Telehealth: Payer: Self-pay | Admitting: Pharmacist

## 2021-08-27 ENCOUNTER — Inpatient Hospital Stay: Payer: BC Managed Care – PPO | Attending: Internal Medicine

## 2021-08-27 ENCOUNTER — Other Ambulatory Visit (HOSPITAL_COMMUNITY): Payer: Self-pay

## 2021-08-27 ENCOUNTER — Other Ambulatory Visit: Payer: Self-pay

## 2021-08-27 VITALS — BP 145/89 | HR 92 | Temp 97.7°F | Resp 18 | Wt 180.8 lb

## 2021-08-27 DIAGNOSIS — I10 Essential (primary) hypertension: Secondary | ICD-10-CM | POA: Insufficient documentation

## 2021-08-27 DIAGNOSIS — Z801 Family history of malignant neoplasm of trachea, bronchus and lung: Secondary | ICD-10-CM | POA: Insufficient documentation

## 2021-08-27 DIAGNOSIS — R569 Unspecified convulsions: Secondary | ICD-10-CM | POA: Diagnosis not present

## 2021-08-27 DIAGNOSIS — C719 Malignant neoplasm of brain, unspecified: Secondary | ICD-10-CM

## 2021-08-27 DIAGNOSIS — Z800 Family history of malignant neoplasm of digestive organs: Secondary | ICD-10-CM | POA: Insufficient documentation

## 2021-08-27 DIAGNOSIS — Z90710 Acquired absence of both cervix and uterus: Secondary | ICD-10-CM | POA: Diagnosis not present

## 2021-08-27 DIAGNOSIS — C712 Malignant neoplasm of temporal lobe: Secondary | ICD-10-CM | POA: Diagnosis not present

## 2021-08-27 LAB — CBC WITH DIFFERENTIAL (CANCER CENTER ONLY)
Abs Immature Granulocytes: 0.01 10*3/uL (ref 0.00–0.07)
Basophils Absolute: 0 10*3/uL (ref 0.0–0.1)
Basophils Relative: 1 %
Eosinophils Absolute: 0.1 10*3/uL (ref 0.0–0.5)
Eosinophils Relative: 1 %
HCT: 37.4 % (ref 36.0–46.0)
Hemoglobin: 12.9 g/dL (ref 12.0–15.0)
Immature Granulocytes: 0 %
Lymphocytes Relative: 33 %
Lymphs Abs: 1.3 10*3/uL (ref 0.7–4.0)
MCH: 30.3 pg (ref 26.0–34.0)
MCHC: 34.5 g/dL (ref 30.0–36.0)
MCV: 87.8 fL (ref 80.0–100.0)
Monocytes Absolute: 0.4 10*3/uL (ref 0.1–1.0)
Monocytes Relative: 12 %
Neutro Abs: 2 10*3/uL (ref 1.7–7.7)
Neutrophils Relative %: 53 %
Platelet Count: 221 10*3/uL (ref 150–400)
RBC: 4.26 MIL/uL (ref 3.87–5.11)
RDW: 12.8 % (ref 11.5–15.5)
Smear Review: NORMAL
WBC Count: 3.8 10*3/uL — ABNORMAL LOW (ref 4.0–10.5)
nRBC: 0 % (ref 0.0–0.2)

## 2021-08-27 LAB — CMP (CANCER CENTER ONLY)
ALT: 55 U/L — ABNORMAL HIGH (ref 0–44)
AST: 26 U/L (ref 15–41)
Albumin: 4.5 g/dL (ref 3.5–5.0)
Alkaline Phosphatase: 128 U/L — ABNORMAL HIGH (ref 38–126)
Anion gap: 7 (ref 5–15)
BUN: 12 mg/dL (ref 6–20)
CO2: 30 mmol/L (ref 22–32)
Calcium: 10 mg/dL (ref 8.9–10.3)
Chloride: 105 mmol/L (ref 98–111)
Creatinine: 0.85 mg/dL (ref 0.44–1.00)
GFR, Estimated: >60 mL/min (ref >60)
Glucose, Bld: 169 mg/dL — ABNORMAL HIGH (ref 70–99)
Potassium: 3.4 mmol/L — ABNORMAL LOW (ref 3.5–5.1)
Sodium: 142 mmol/L (ref 135–145)
Total Bilirubin: 1.9 mg/dL — ABNORMAL HIGH (ref 0.3–1.2)
Total Protein: 7.2 g/dL (ref 6.5–8.1)

## 2021-08-27 MED ORDER — LOMUSTINE 100 MG PO CAPS
100.0000 mg | ORAL_CAPSULE | Freq: Once | ORAL | 0 refills | Status: DC
  Filled 2021-08-27: qty 1, 1d supply, fill #0

## 2021-08-27 MED ORDER — LOMUSTINE 40 MG PO CAPS: 80.0000 mg | ORAL_CAPSULE | Freq: Once | ORAL | 0 refills | Status: AC

## 2021-08-27 MED ORDER — LOMUSTINE 40 MG PO CAPS
80.0000 mg | ORAL_CAPSULE | Freq: Once | ORAL | 0 refills | Status: DC
  Filled 2021-08-27: qty 2, 1d supply, fill #0

## 2021-08-27 MED ORDER — LOMUSTINE 100 MG PO CAPS: 100.0000 mg | ORAL_CAPSULE | Freq: Once | ORAL | 0 refills | Status: AC

## 2021-08-27 MED ORDER — ONDANSETRON HCL 8 MG PO TABS
8.0000 mg | ORAL_TABLET | Freq: Two times a day (BID) | ORAL | 1 refills | Status: DC | PRN
  Filled 2021-08-27: qty 30, 15d supply, fill #0

## 2021-08-27 NOTE — Progress Notes (Signed)
DISCONTINUE ON PATHWAY REGIMEN - Neuro     A cycle is every 28 days:     Temozolomide      Temozolomide   **Always confirm dose/schedule in your pharmacy ordering system**  REASON: Disease Progression PRIOR TREATMENT: [id number]: Temozolomide 150/200 mg/m2 PO D1-5 q28 Days x up to 12 Cycles TREATMENT RESPONSE: Progressive Disease (PD)  START ON PATHWAY REGIMEN - Neuro     A cycle is every 42 days:     Lomustine   **Always confirm dose/schedule in your pharmacy ordering system**  Patient Characteristics: Glioma, Glioblastoma, IDH-wildtype, Recurrent or Progressive, Nonsurgical Candidate, Systemic Therapy Candidate, BRAF V600E Mutation Negative/Unknown and NTRK Fusion Negative/Unknown Disease Classification: Glioma Disease Classification: Glioblastoma, IDH-wildtype Disease Status: Recurrent or Progressive Treatment Classification: Nonsurgical Candidate Treatment (Nonsurgical/Adjuvant): Systemic Therapy Candidate NTRK Gene Fusion Status: Negative BRAF V600E Mutation Status: Negative Intent of Therapy: Non-Curative / Palliative Intent, Discussed with Patient 

## 2021-08-27 NOTE — Telephone Encounter (Addendum)
Oral Oncology Pharmacist Encounter ? ?Received new prescription for Gleostine (lomustine) for the treatment of glioblastoma in conjunction with bevacizumab, planned duration until disease progression or unacceptable drug toxicity. ? ?CBC w/ Diff and CMP from 08/27/21 assessed, noted T. Bili 1.9 mg/dL, AST WNL, ALT slightly above ULN. No baseline hepatic dose adjustments required. Prescription dose and frequency assessed for appropriateness.  ? ?Current medication list in Epic reviewed, no relevant/significant DDIs with Gleostine identified. ? ?Evaluated chart and no patient barriers to medication adherence noted.  ? ?Patient agreement for treatment documented in MD note on 08/27/21. ? ?Per patient's insurance plan, prior authorization not required at this time for [facility]. Patient's insurance requires Gleostine to be filled through Therapist, nutritional. Prescriptions redirected for dispensing.  ? ?Oral Oncology Clinic will continue to follow for copayment issues, initial counseling and start date. ? ?[name], PharmD, BCPS ?Hematology/Oncology Clinical Pharmacist ?[name] and [facility] Oral Chemotherapy Navigation Clinics ?[phone] ?08/27/2021 10:14 AM ? ?

## 2021-08-27 NOTE — Progress Notes (Signed)
? ?[facility] [address]  ?[place], [place] [address] ?[phone] ? ? ?Interval Evaluation ? ?Date of Service: 08/27/21 ?Patient Name: [name] ?Patient MRN: [id number] ?Patient DOB: [birth year] ?Provider: Ventura Sellers, MD ? ?Identifying Statement:  ?[name] is a 56 y.o. female with left temporal glioblastoma  ? ?Oncologic History: ?Oncology History  ?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)  ?11/21/2020 Initial Diagnosis  ? 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) ? ?  ?11/21/2020 Cancer Staging  ? Staging form: Brain and Spinal Cord, AJCC 8th Edition ?- Pathologic stage from 11/21/2020: WHO Grade IV - Signed by Ventura Sellers, MD on 11/30/2020 ?Histopathologic type: Glioblastoma ?Stage prefix: Initial diagnosis ?Histologic grading system: 4 grade system ?Extent of surgical resection: Subtotal resection ?Karnofsky performance status: Score 90 ?Seizures at presentation: Present ?Duration of symptoms before diagnosis: Short ?IDH1 mutation: Negative ? ?  ?12/27/2020 - 02/07/2021 Radiation Therapy  ? IMRT and concurrent Temodar [facility]) ?  ?03/12/2021 - 08/26/2021 Chemotherapy  ? Completes 5 cycles of adjuvant 5-day Temodar ?  ?08/27/2021 Progression  ? POD #1 ?  ? ? ?Biomarkers: ? ?MGMT Methylated.  ?IDH 1/2 Wild type.  ?EGFR Unknown  ?TERT Unknown  ? ?Interval History: ?[name] presents today for follow up with MRI brain for evaluation following cycle #5 of Temodar.  Denies new or progressive changes, though vision has been "a little blurry" in the left eye.  This improves with use of glasses.  No further seizures.  Headaches are stable.  Short term memory impairment and fatigue are still persistent.   Decadron currently dosed at 28m daily. ?   ? ?H+P (11/25/20) Patient presented to medical attention this past month with first ever seizure.  She describes episode of sudden onset "disconnection" with tongue  biting and urination, followed by period of confusion.  CNS imaging demonstrated enhancing mass within left anterior temporal lobe, which was mostly resected by Dr. OZada Finderson 11/21/20.  Since surgery she had not had recurrence of seizures.  She complains of painful cramping of her right leg, which started ~1 week prior.  At this time the cramping is her main complaint.  Otherwise fully functional, independent, would like to return to work if possible. ? ?Medications: ?Current Outpatient Medications on File Prior to Visit  ?Medication Sig Dispense Refill  ? amitriptyline (ELAVIL) 50 MG tablet TAKE 1 TABLET BY MOUTH EVERYDAY AT BEDTIME 90 tablet 2  ? amLODipine (NORVASC) 10 MG tablet Take 1 tablet (10 mg total) by mouth daily. 30 tablet 2  ? baclofen 5 MG TABS Take 5 mg by mouth 3 (three) times daily as needed for muscle spasms. 60 tablet 1  ? [facility] D3 125 MCG (5000 UT) capsule TAKE 1 CAPSULE (5,000 UNITS TOTAL) BY MOUTH DAILY. 90 capsule 3  ? dexamethasone (DECADRON) 1 MG tablet TAKE 1 TABLET BY MOUTH EVERY DAY 30 tablet 1  ? dexamethasone (DECADRON) 4 MG tablet Take 0.5 tablets (2 mg total) by mouth daily. 60 tablet 1  ? lacosamide (VIMPAT) 50 MG TABS tablet TAKE 1 TABLET BY MOUTH TWICE A DAY 60 tablet 3  ? levETIRAcetam (KEPPRA) 750 MG tablet TAKE 2 TABLETS (1,500 MG TOTAL) BY MOUTH 2 (TWO) TIMES DAILY. 360 tablet 1  ? levothyroxine (SYNTHROID) 100 MCG tablet TAKE 1 TABLET BY MOUTH EVERY DAY 90 tablet 3  ? LORazepam (ATIVAN) 2 MG tablet Take 1 tablet (2 mg total) by mouth every 8 (eight) hours  as needed for seizure. (Patient not taking: Reported on 07/30/2021) 12 tablet 0  ? ondansetron (ZOFRAN) 8 MG tablet Take 1 tablet by mouth twice daily as needed for nausea & vomiting. May take 30-60 minutes prior to Temodar administration if nausea/vomiting occurs. 30 tablet 1  ? Potassium Chloride ER 20 MEQ TBCR TAKE 1 TABLET BY MOUTH EVERY DAY 90 tablet 3  ? temozolomide (TEMODAR) 100 MG capsule Take 3 capsules (300 mg  total) by mouth daily. Take for 5 days on, 23 days off. Repeat every 28 days. May take on an empty stomach to decrease nausea & vomiting. (Patient not taking: Reported on 07/30/2021) 15 capsule 0  ? temozolomide (TEMODAR) 100 MG capsule Take 3 capsules (300 mg total) by mouth daily. Take for 5 days on, 23 days off. Repeat every 28 days. May take on an empty stomach to decrease nausea & vomiting. 15 capsule 0  ? ?No current facility-administered medications on file prior to visit.  ? ? ?Allergies: No Known Allergies ?Past Medical History:  ?Past Medical History:  ?Diagnosis Date  ? Bell's palsy   ? left side of face- notied in smile and eyes, if very tired  ? Brain cancer [facility])   ? Complication of anesthesia   ? N/V  ? Essential hypertension   ? External hemorrhoids   ? History of blood transfusion   ? post op Hemorroids  ? History of pneumonia   ? Hyperlipidemia   ? Hyperthyroidism   ? hx 56 years old- oral meds  ? Iron deficiency anemia   ? Migraine headache   ? Pneumonia   ? x 2 - years ago  ? PONV (postoperative nausea and vomiting)   ? PPD positive   ? Pulmonary nodules   ? not seen on plain cxr, first detected 1/07 re ct 10/08 pos IPPD > 15 mm 12/09 FOB/lavage 04/20/08 neg afb smear  ? PVC's (premature ventricular contractions)   ? Seizure ([facility]) 11/15/2020  ? Sleep apnea   ? refused CPAP- mouth piece recommended  ? Tuberculosis   ? in her late 76's  ? ?Past Surgical History:  ?Past Surgical History:  ?Procedure Laterality Date  ? ABDOMINAL HYSTERECTOMY    ? APPLICATION OF CRANIAL NAVIGATION N/A 11/21/2020  ? Procedure: APPLICATION OF CRANIAL NAVIGATION;  Surgeon: Judith Part, MD;  Location: [facility];  Service: Neurosurgery;  Laterality: N/A;  ? BREAST BIOPSY Right   ? CHOLECYSTECTOMY  2014  ? COLONOSCOPY  05/04/2009  ? prolapsing external hemorrhoids  ? CRANIOTOMY Left 11/21/2020  ? Procedure: Left craniotomy for tumor resection;  Surgeon: Judith Part, MD;  Location: [facility];  Service: Neurosurgery;   Laterality: Left;  ? ENDOMETRIAL ABLATION    ? HEMORRHOID SURGERY    ? x 3  ? TUBAL LIGATION    ? ?Social History:  ?Social History  ? ?Socioeconomic History  ? Marital status: Divorced  ?  Spouse name: Not on file  ? Number of children: 2  ? Years of education: Not on file  ? Highest education level: Associate degree: occupational, Hotel manager, or vocational program  ?Occupational History  ? Occupation: accounts billing  ?  Employer: [employer]  ?Tobacco Use  ? Smoking status: Never  ? Smokeless tobacco: Never  ?Vaping Use  ? Vaping Use: Never used  ?Substance and Sexual Activity  ? Alcohol use: No  ?  Alcohol/week: 0.0 standard drinks  ? Drug use: No  ? Sexual activity: Not on file  ?Other  Topics Concern  ? Not on file  ?Social History Narrative  ? Divorced with 2 children  ? She works as a Librarian, 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[employer]   ? Possible TB exposure 2008 (after nodules found)  ? From [place] lived there and [place]  ? Never owned birds  ?   ? ?Social Determinants of Health  ? ?Financial Resource Strain: Medium Risk  ? Difficulty of Paying Living Expenses: Somewhat hard  ?Food Insecurity: Food Insecurity Present  ? Worried About Charity fundraiser in the Last Year: Sometimes true  ? Ran Out of Food in the Last Year: Never true  ?Transportation Needs: No Transportation Needs  ? Lack of Transportation (Medical): No  ? Lack of Transportation (Non-Medical): No  ?Physical Activity: Unknown  ? Days of Exercise per Week: 0 days  ? Minutes of Exercise per Session: Not on file  ?Stress: No Stress Concern Present  ? Feeling of Stress : Only a little  ?Social Connections: Moderately Integrated  ? Frequency of Communication with Friends and Family: Twice a week  ? Frequency of Social Gatherings with Friends and Family: Twice a week  ? Attends Religious Services: More than 4 times per year  ? Active Member of Clubs or Organizations: Yes  ? Attends Archivist Meetings: More than 4 times per year  ? Marital Status: Divorced  ?Intimate Partner  Violence: Not on file  ? ?Family History:  ?Family History  ?Problem Relation Age of Onset  ? Diabetes type II Mother   ? Kidney failure Sister   ? Diabetes Mellitus II Sister   ? Colon cancer Maternal Aunt   ?

## 2021-08-28 ENCOUNTER — Telehealth: Payer: Self-pay | Admitting: Internal Medicine

## 2021-08-28 DIAGNOSIS — H524 Presbyopia: Secondary | ICD-10-CM | POA: Diagnosis not present

## 2021-08-28 NOTE — Telephone Encounter (Signed)
.  Called patient to schedule appointment per 4/24 inbasket, patient is aware of date and time.   ?

## 2021-08-28 NOTE — Telephone Encounter (Signed)
Oral Chemotherapy Pharmacist Encounter  ? ?Followed up with [facility] regarding prescription for Gleostine (lomustine).  ? ?Confirmed with pharmacist that prescription will be administered once every 6 weeks. Patient has $0 copay for the 40 mg capsules and $88 copay for the 100 mg capsule.  ? ?[facility] to [facility] to set up medication shipment.  ? ?Oral Oncology Clinic will continue to follow  ? ?Leron Croak, PharmD, BCPS ?Hematology/Oncology Clinical Pharmacist ?[name] Oral Chemotherapy Navigation Clinics ?[phone] ?08/28/2021 10:02 AM ? ?

## 2021-08-30 ENCOUNTER — Other Ambulatory Visit: Payer: BC Managed Care – PPO

## 2021-08-30 ENCOUNTER — Ambulatory Visit: Payer: BC Managed Care – PPO | Admitting: Internal Medicine

## 2021-08-30 ENCOUNTER — Other Ambulatory Visit (HOSPITAL_COMMUNITY): Payer: Self-pay

## 2021-08-30 NOTE — Progress Notes (Signed)
Pharmacist Chemotherapy Monitoring - Initial Assessment   ? ?Anticipated start date: 09/06/21  ? ?The following has been reviewed per standard work regarding the patient's treatment regimen: ?The patient's diagnosis, treatment plan and drug doses, and organ/hematologic function ?Lab orders and baseline tests specific to treatment regimen  ?The treatment plan start date, drug sequencing, and pre-medications ?Prior authorization status  ?Patient's documented medication list, including drug-drug interaction screen and prescriptions for anti-emetics and supportive care specific to the treatment regimen ?The drug concentrations, fluid compatibility, administration routes, and timing of the medications to be used ?The patient's access for treatment and lifetime cumulative dose history, if applicable  ?The patient's medication allergies and previous infusion related reactions, if applicable  ? ?Changes made to treatment plan:  ?N/A ? ?Follow up needed:  ?Pending authorization for treatment  ? ?Kennith Center, Pharm.D., CPP ?08/30/2021'@2'$ :19 PM ? ? ? ?

## 2021-08-31 ENCOUNTER — Encounter: Payer: Self-pay | Admitting: Genetic Counselor

## 2021-09-04 ENCOUNTER — Telehealth: Payer: Self-pay | Admitting: Hematology

## 2021-09-04 ENCOUNTER — Other Ambulatory Visit: Payer: Self-pay | Admitting: Hematology

## 2021-09-04 DIAGNOSIS — K8689 Other specified diseases of pancreas: Secondary | ICD-10-CM

## 2021-09-04 NOTE — Telephone Encounter (Signed)
I called pt and reviewed her recent CT abdomen scan which showed stable pancreatic mass, no signs of progression or metastasis. She denies any abdominal symptoms. I recommend continue monitoring, no urgent need for biopsy at this point. She agrees with the plan. I will see her back in 6 month with a repeated CT AP with pancreatic protocol.  ? ?[name]  ?09/04/2021  ?

## 2021-09-05 ENCOUNTER — Encounter: Payer: Self-pay | Admitting: Internal Medicine

## 2021-09-06 ENCOUNTER — Inpatient Hospital Stay: Payer: BC Managed Care – PPO

## 2021-09-06 ENCOUNTER — Inpatient Hospital Stay: Payer: BC Managed Care – PPO | Attending: Internal Medicine | Admitting: Internal Medicine

## 2021-09-06 ENCOUNTER — Other Ambulatory Visit: Payer: Self-pay

## 2021-09-06 VITALS — BP 135/95 | HR 85 | Temp 97.6°F | Resp 20 | Wt 181.6 lb

## 2021-09-06 DIAGNOSIS — C712 Malignant neoplasm of temporal lobe: Secondary | ICD-10-CM | POA: Diagnosis not present

## 2021-09-06 DIAGNOSIS — R569 Unspecified convulsions: Secondary | ICD-10-CM

## 2021-09-06 DIAGNOSIS — C719 Malignant neoplasm of brain, unspecified: Secondary | ICD-10-CM

## 2021-09-06 DIAGNOSIS — Z5111 Encounter for antineoplastic chemotherapy: Secondary | ICD-10-CM | POA: Diagnosis not present

## 2021-09-06 LAB — CMP (CANCER CENTER ONLY)
ALT: 26 U/L (ref 0–44)
AST: 16 U/L (ref 15–41)
Albumin: 4.4 g/dL (ref 3.5–5.0)
Alkaline Phosphatase: 135 U/L — ABNORMAL HIGH (ref 38–126)
Anion gap: 10 (ref 5–15)
BUN: 12 mg/dL (ref 6–20)
CO2: 26 mmol/L (ref 22–32)
Calcium: 9.7 mg/dL (ref 8.9–10.3)
Chloride: 106 mmol/L (ref 98–111)
Creatinine: 0.9 mg/dL (ref 0.44–1.00)
GFR, Estimated: >60 mL/min (ref >60)
Glucose, Bld: 167 mg/dL — ABNORMAL HIGH (ref 70–99)
Potassium: 3.4 mmol/L — ABNORMAL LOW (ref 3.5–5.1)
Sodium: 142 mmol/L (ref 135–145)
Total Bilirubin: 1.4 mg/dL — ABNORMAL HIGH (ref 0.3–1.2)
Total Protein: 7 g/dL (ref 6.5–8.1)

## 2021-09-06 LAB — CBC WITH DIFFERENTIAL (CANCER CENTER ONLY)
Abs Immature Granulocytes: 0.01 10*3/uL (ref 0.00–0.07)
Basophils Absolute: 0 10*3/uL (ref 0.0–0.1)
Basophils Relative: 1 %
Eosinophils Absolute: 0.1 10*3/uL (ref 0.0–0.5)
Eosinophils Relative: 3 %
HCT: 38.8 % (ref 36.0–46.0)
Hemoglobin: 13.5 g/dL (ref 12.0–15.0)
Immature Granulocytes: 0 %
Lymphocytes Relative: 26 %
Lymphs Abs: 1.2 10*3/uL (ref 0.7–4.0)
MCH: 30.8 pg (ref 26.0–34.0)
MCHC: 34.8 g/dL (ref 30.0–36.0)
MCV: 88.6 fL (ref 80.0–100.0)
Monocytes Absolute: 0.4 10*3/uL (ref 0.1–1.0)
Monocytes Relative: 8 %
Neutro Abs: 2.8 10*3/uL (ref 1.7–7.7)
Neutrophils Relative %: 62 %
Platelet Count: 202 10*3/uL (ref 150–400)
RBC: 4.38 MIL/uL (ref 3.87–5.11)
RDW: 13.5 % (ref 11.5–15.5)
WBC Count: 4.4 10*3/uL (ref 4.0–10.5)
nRBC: 0 % (ref 0.0–0.2)

## 2021-09-06 LAB — TOTAL PROTEIN, URINE DIPSTICK: Protein, ur: 30 mg/dL — AB

## 2021-09-06 MED ORDER — SODIUM CHLORIDE 0.9 % IV SOLN
10.0000 mg/kg | Freq: Once | INTRAVENOUS | Status: AC
  Administered 2021-09-06: 800 mg via INTRAVENOUS
  Filled 2021-09-06: qty 32

## 2021-09-06 MED ORDER — ONDANSETRON HCL 8 MG PO TABS: 8.0000 mg | ORAL_TABLET | Freq: Two times a day (BID) | ORAL | 1 refills | Status: DC | PRN

## 2021-09-06 MED ORDER — SODIUM CHLORIDE 0.9 % IV SOLN: Freq: Once | INTRAVENOUS | Status: AC

## 2021-09-06 NOTE — Patient Instructions (Signed)
[facility]  Discharge Instructions: ?Thank you for choosing [facility] to provide your oncology and hematology care.  ? ?If you have a lab appointment with the [facility], please go directly to the [facility] and check in at the registration area. ?  ?Wear comfortable clothing and clothing appropriate for easy access to any Portacath or PICC line.  ? ?We strive to give you quality time with your provider. You may need to reschedule your appointment if you arrive late (15 or more minutes).  Arriving late affects you and other patients whose appointments are after yours.  Also, if you miss three or more appointments without notifying the office, you may be dismissed from the clinic at the provider?s discretion.    ?  ?For prescription refill requests, have your pharmacy contact our office and allow 72 hours for refills to be completed.   ? ?Today you received the following chemotherapy and/or immunotherapy agents: Bevacizumab.     ?  ?To help prevent nausea and vomiting after your treatment, we encourage you to take your nausea medication as directed. ? ?BELOW ARE SYMPTOMS THAT SHOULD BE REPORTED IMMEDIATELY: ?*FEVER GREATER THAN 100.4 F (38 ?C) OR HIGHER ?*CHILLS OR SWEATING ?*NAUSEA AND VOMITING THAT IS NOT CONTROLLED WITH YOUR NAUSEA MEDICATION ?*UNUSUAL SHORTNESS OF BREATH ?*UNUSUAL BRUISING OR BLEEDING ?*URINARY PROBLEMS (pain or burning when urinating, or frequent urination) ?*BOWEL PROBLEMS (unusual diarrhea, constipation, pain near the anus) ?TENDERNESS IN MOUTH AND THROAT WITH OR WITHOUT PRESENCE OF ULCERS (sore throat, sores in mouth, or a toothache) ?UNUSUAL RASH, SWELLING OR PAIN  ?UNUSUAL VAGINAL DISCHARGE OR ITCHING  ? ?Items with * indicate a potential emergency and should be followed up as soon as possible or go to the Emergency Department if any problems should occur. ? ?Please show the CHEMOTHERAPY ALERT CARD or IMMUNOTHERAPY ALERT CARD at check-in  to the Emergency Department and triage nurse. ? ?Should you have questions after your visit or need to cancel or reschedule your appointment, please contact [facility]  Dept: [phone]  and follow the prompts.  Office hours are 8:00 a.m. to 4:30 p.m. Monday - Friday. Please note that voicemails left after 4:00 p.m. may not be returned until the following business day.  We are closed weekends and major holidays. You have access to a nurse at all times for urgent questions. Please call the main number to the clinic Dept: [phone] and follow the prompts. ? ? ?For any non-urgent questions, you may also contact your provider using MyChart. We now offer e-Visits for anyone 16 and older to request care online for non-urgent symptoms. For details visit [email or web address]. ?  ?Also download the MyChart app! Go to the app store, search "MyChart", open the app, select [facility], and log in with your MyChart username and password. ? ?Due to Covid, a mask is required upon entering the hospital/clinic. If you do not have a mask, one will be given to you upon arrival. For doctor visits, patients may have 1 support person aged 24 or older with them. For treatment visits, patients cannot have anyone with them due to current Covid guidelines and our immunocompromised population.  ? ?Bevacizumab injection ?What is this medication? ?BEVACIZUMAB (be va SIZ yoo mab) is a monoclonal antibody. It is used to treat many types of cancer. ?This medicine may be used for other purposes; ask your health care provider or pharmacist if you have questions. ?COMMON BRAND NAME(S): Alymsys,  Avastin, MVASI, Zirabev ?What should I tell my care team before I take this medication? ?They need to know if you have any of these conditions: ?diabetes ?heart disease ?high blood pressure ?history of coughing up blood ?prior anthracycline chemotherapy (e.g., doxorubicin, daunorubicin, epirubicin) ?recent or ongoing  radiation therapy ?recent or planning to have surgery ?stroke ?an unusual or allergic reaction to bevacizumab, hamster proteins, mouse proteins, other medicines, foods, dyes, or preservatives ?pregnant or trying to get pregnant ?breast-feeding ?How should I use this medication? ?This medicine is for infusion into a vein. It is given by a health care professional in a hospital or clinic setting. ?Talk to your pediatrician regarding the use of this medicine in children. Special care may be needed. ?Overdosage: If you think you have taken too much of this medicine contact a poison control center or emergency room at once. ?NOTE: This medicine is only for you. Do not share this medicine with others. ?What if I miss a dose? ?It is important not to miss your dose. Call your doctor or health care professional if you are unable to keep an appointment. ?What may interact with this medication? ?Interactions are not expected. ?This list may not describe all possible interactions. Give your health care provider a list of all the medicines, herbs, non-prescription drugs, or dietary supplements you use. Also tell them if you smoke, drink alcohol, or use illegal drugs. Some items may interact with your medicine. ?What should I watch for while using this medication? ?Your condition will be monitored carefully while you are receiving this medicine. You will need important blood work and urine testing done while you are taking this medicine. ?This medicine may increase your risk to bruise or bleed. Call your doctor or health care professional if you notice any unusual bleeding. ?Before having surgery, talk to your health care provider to make sure it is ok. This drug can increase the risk of poor healing of your surgical site or wound. You will need to stop this drug for 28 days before surgery. After surgery, wait at least 28 days before restarting this drug. Make sure the surgical site or wound is healed enough before restarting  this drug. Talk to your health care provider if questions. ?Do not become pregnant while taking this medicine or for 6 months after stopping it. Women should inform their doctor if they wish to become pregnant or think they might be pregnant. There is a potential for serious side effects to an unborn child. Talk to your health care professional or pharmacist for more information. Do not breast-feed an infant while taking this medicine and for 6 months after the last dose. ?This medicine has caused ovarian failure in some women. This medicine may interfere with the ability to have a child. You should talk to your doctor or health care professional if you are concerned about your fertility. ?What side effects may I notice from receiving this medication? ?Side effects that you should report to your doctor or health care professional as soon as possible: ?allergic reactions like skin rash, itching or hives, swelling of the face, lips, or tongue ?chest pain or chest tightness ?chills ?coughing up blood ?high fever ?seizures ?severe constipation ?signs and symptoms of bleeding such as bloody or black, tarry stools; red or dark-brown urine; spitting up blood or brown material that looks like coffee grounds; red spots on the skin; unusual bruising or bleeding from the eye, gums, or nose ?signs and symptoms of a blood clot such as breathing  problems; chest pain; severe, sudden headache; pain, swelling, warmth in the leg ?signs and symptoms of a stroke like changes in vision; confusion; trouble speaking or understanding; severe headaches; sudden numbness or weakness of the face, arm or leg; trouble walking; dizziness; loss of balance or coordination ?stomach pain ?sweating ?swelling of legs or ankles ?vomiting ?weight gain ?Side effects that usually do not require medical attention (report to your doctor or health care professional if they continue or are bothersome): ?back pain ?changes in taste ?decreased appetite ?dry  skin ?nausea ?tiredness ?This list may not describe all possible side effects. Call your doctor for medical advice about side effects. You may report side effects to FDA at 1-800-FDA-1088. ?Where should I keep

## 2021-09-06 NOTE — Progress Notes (Signed)
Per Dr Mickeal Skinner patient can received first dose of Avastin 09/06/2021.  All labs and vitals have been reviewed and approved. ?

## 2021-09-06 NOTE — Progress Notes (Signed)
? ?[facility] [address]  ?[place], [place] [address] ?[phone] ? ? ?Interval Evaluation ? ?Date of Service: 09/06/21 ?Patient Name: [name] ?Patient MRN: [id number] ?Patient DOB: 1966/01/07 ?Provider: Ventura Sellers, MD ? ?Identifying Statement:  ?[name] is a 56 y.o. female with left temporal glioblastoma  ? ?Oncologic History: ?Oncology History  ?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)  ?11/21/2020 Initial Diagnosis  ? 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) ? ?  ?11/21/2020 Cancer Staging  ? Staging form: Brain and Spinal Cord, AJCC 8th Edition ?- Pathologic stage from 11/21/2020: WHO Grade IV - Signed by Ventura Sellers, MD on 11/30/2020 ?Histopathologic type: Glioblastoma ?Stage prefix: Initial diagnosis ?Histologic grading system: 4 grade system ?Extent of surgical resection: Subtotal resection ?Karnofsky performance status: Score 90 ?Seizures at presentation: Present ?Duration of symptoms before diagnosis: Short ?IDH1 mutation: Negative ? ?  ?12/27/2020 - 02/07/2021 Radiation Therapy  ? IMRT and concurrent Temodar Is[facility]os) ?  ?03/12/2021 - 08/26/2021 Chemotherapy  ? Completes 5 cycles of adjuvant 5-day Temodar ?  ?08/27/2021 Progression  ? POD #1 ?  ?08/27/2021 -  Chemotherapy  ? Patient is on Treatment Plan : BRAIN Lomustine q42d  ? ?  ?  ?09/06/2021 -  Chemotherapy  ? Patient is on Treatment Plan : BRAIN GBM Bevacizumab 14d x 6 cycles  ? ?  ?  ? ? ?Biomarkers: ? ?MGMT Methylated.  ?IDH 1/2 Wild type.  ?EGFR Unknown  ?TERT Unknown  ? ?Interval History: ?[name] presents today for initial avastin infusion.  No new or progressive deficits noted today.  Plans to dose CCNU tonight.  No further seizures.  Headaches are stable.  Short term memory impairment and fatigue are still persistent.   Decadron has been discontinued. ?   ? ?H+P (11/25/20) Patient presented to medical attention this past month with  first ever seizure.  She describes episode of sudden onset "disconnection" with tongue biting and urination, followed by period of confusion.  CNS imaging demonstrated enhancing mass within left anterior temporal lobe, which was mostly resected by Dr. Zada Finders on 11/21/20.  Since surgery she had not had recurrence of seizures.  She complains of painful cramping of her right leg, which started ~1 week prior.  At this time the cramping is her main complaint.  Otherwise fully functional, independent, would like to return to work if possible. ? ?Medications: ?Current Outpatient Medications on File Prior to Visit  ?Medication Sig Dispense Refill  ? amitriptyline (ELAVIL) 50 MG tablet TAKE 1 TABLET BY MOUTH EVERYDAY AT BEDTIME 90 tablet 2  ? amLODipine (NORVASC) 10 MG tablet Take 1 tablet (10 mg total) by mouth daily. 30 tablet 2  ? baclofen 5 MG TABS Take 5 mg by mouth 3 (three) times daily as needed for muscle spasms. 60 tablet 1  ? [facility] D3 125 MCG (5000 UT) capsule TAKE 1 CAPSULE (5,000 UNITS TOTAL) BY MOUTH DAILY. 90 capsule 3  ? lacosamide (VIMPAT) 50 MG TABS tablet TAKE 1 TABLET BY MOUTH TWICE A DAY 60 tablet 3  ? levETIRAcetam (KEPPRA) 750 MG tablet TAKE 2 TABLETS (1,500 MG TOTAL) BY MOUTH 2 (TWO) TIMES DAILY. 360 tablet 1  ? levothyroxine (SYNTHROID) 100 MCG tablet TAKE 1 TABLET BY MOUTH EVERY DAY 90 tablet 3  ? LORazepam (ATIVAN) 2 MG tablet Take 1 tablet (2 mg total) by mouth every 8 (eight) hours as needed for seizure. 12 tablet 0  ? ondansetron (ZOFRAN)  8 MG tablet Take 1 tablet (8 mg total) by mouth 2 (two) times daily as needed. Start on the third day after chemotherapy. 30 tablet 1  ? Potassium Chloride ER 20 MEQ TBCR TAKE 1 TABLET BY MOUTH EVERY DAY 90 tablet 3  ? ?No current facility-administered medications on file prior to visit.  ? ? ?Allergies: No Known Allergies ?Past Medical History:  ?Past Medical History:  ?Diagnosis Date  ? Bell's palsy   ? left side of face- notied in smile and eyes, if very  tired  ? Brain cancer [facility])   ? Complication of anesthesia   ? N/V  ? Essential hypertension   ? External hemorrhoids   ? History of blood transfusion   ? post op Hemorroids  ? History of pneumonia   ? Hyperlipidemia   ? Hyperthyroidism   ? hx 56 years old- oral meds  ? Iron deficiency anemia   ? Migraine headache   ? Pneumonia   ? x 2 - years ago  ? PONV (postoperative nausea and vomiting)   ? PPD positive   ? Pulmonary nodules   ? not seen on plain cxr, first detected 1/07 re ct 10/08 pos IPPD > 15 mm 12/09 FOB/lavage 04/20/08 neg afb smear  ? PVC's (premature ventricular contractions)   ? Seizure ([facility]) 11/15/2020  ? Sleep apnea   ? refused CPAP- mouth piece recommended  ? Tuberculosis   ? in her late 75's  ? ?Past Surgical History:  ?Past Surgical History:  ?Procedure Laterality Date  ? ABDOMINAL HYSTERECTOMY    ? APPLICATION OF CRANIAL NAVIGATION N/A 11/21/2020  ? Procedure: APPLICATION OF CRANIAL NAVIGATION;  Surgeon: Judith Part, MD;  Location: [facility];  Service: Neurosurgery;  Laterality: N/A;  ? BREAST BIOPSY Right   ? CHOLECYSTECTOMY  2014  ? COLONOSCOPY  05/04/2009  ? prolapsing external hemorrhoids  ? CRANIOTOMY Left 11/21/2020  ? Procedure: Left craniotomy for tumor resection;  Surgeon: Judith Part, MD;  Location: [facility];  Service: Neurosurgery;  Laterality: Left;  ? ENDOMETRIAL ABLATION    ? HEMORRHOID SURGERY    ? x 3  ? TUBAL LIGATION    ? ?Social History:  ?Social History  ? ?Socioeconomic History  ? Marital status: Divorced  ?  Spouse name: Not on file  ? Number of children: 2  ? Years of education: Not on file  ? Highest education level: Associate degree: occupational, Hotel manager, or vocational program  ?Occupational History  ? Occupation: accounts billing  ?  Employer: [employer]  ?Tobacco Use  ? Smoking status: Never  ? Smokeless tobacco: Never  ?Vaping Use  ? Vaping Use: Never used  ?Substance and Sexual Activity  ? Alcohol use: No  ?  Alcohol/week: 0.0 standard drinks  ? Drug use: No  ?  Sexual activity: Not on file  ?Other Topics Concern  ? Not on file  ?Social History Narrative  ? Divorced with 2 children  ? She works as a Librarian, 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[employer]   ? Possible TB exposure 2008 (after nodules found)  ? From [place] lived there and [place]  ? Never owned birds  ?   ? ?Social Determinants of Health  ? ?Financial Resource Strain: Medium Risk  ? Difficulty of Paying Living Expenses: Somewhat hard  ?Food Insecurity: Food Insecurity Present  ? Worried About Charity fundraiser in the Last Year: Sometimes true  ? Ran Out of Food in the Last Year: Never true  ?Transportation Needs: No  Transportation Needs  ? Lack of Transportation (Medical): No  ? Lack of Transportation (Non-Medical): No  ?Physical Activity: Unknown  ? Days of Exercise per Week: 0 days  ? Minutes of Exercise per Session: Not on file  ?Stress: No Stress Concern Present  ? Feeling of Stress : Only a little  ?Social Connections: Moderately Integrated  ? Frequency of Communication with Friends and Family: Twice a week  ? Frequency of Social Gatherings with Friends and Family: Twice a week  ? Attends Religious Services: More than 4 times per year  ? Active Member of Clubs or Organizations: Yes  ? Attends Archivist Meetings: More than 4 times per year  ? Marital Status: Divorced  ?Intimate Partner Violence: Not on file  ? ?Family History:  ?Family History  ?Problem Relation Age of Onset  ? Diabetes type II Mother   ? Kidney failure Sister   ? Diabetes Mellitus II Sister   ? Colon cancer Maternal Aunt   ?     aunts and uncles  ? Diabetes Mellitus II Maternal Aunt   ? Pancreatic cancer Maternal Aunt   ? Colon cancer Maternal Uncle   ? Diabetes Mellitus II Maternal Uncle   ? Heart disease Maternal Grandmother   ?     aunts, uncles, sister  ? Diabetes Mellitus II Maternal Grandmother   ? Lung cancer Other   ?     aunts and uncles  ? ? ?Review of Systems: ?Constitutional: Doesn't report fevers, chills or abnormal weight loss ?Eyes: Doesn't report  blurriness of vision ?Ears, nose, mouth, throat, and face: Doesn't report sore throat ?Respiratory: Doesn't report cough, dyspnea or wheezes ?Cardiovascular: Doesn't report palpitation, chest discomfort  ?Gas

## 2021-09-06 NOTE — Telephone Encounter (Signed)
Oral Chemotherapy Pharmacist Encounter ? ?I met with patient in infusion for overview of: lomustine for the treatment of glioblastoma in conjunction with bevacizumab, planned duration until disease progression or unacceptable drug toxicity. ? ?Counseled patient on administration, dosing, side effects, monitoring, drug-food interactions, safe handling, storage, and disposal. ? ?Patient will take lomustine 2 (two) 27m capsules and 1 (one) 100 capsule (180 mg total dose) by mouth once daily, may take at bedtime and on an empty stomach to decrease nausea and vomiting. ? ?Lomustine will be administered once every 6 weeks. ? ?Lomustine start date: 09/06/21 PM ?  ?Instructed patient to take Zofran 844mtablet, 1 tablet by mouth 30-60 min prior to lomustine dose to help decrease N/V. [facility]  ?  ?Adverse effects include but are not limited to: nausea, vomiting, stomatitis, alopecia, decreased blood counts, alopecia, hepatotoxicity, and pulmonary toxicity. ? ?Reviewed with patient importance of keeping a medication schedule and plan for any missed doses. No barriers to medication adherence identified. ? ?Medication reconciliation performed and medication/allergy list updated. ? ?Insurance authorization for lomustine has been obtained. Medication [facility].  ? ?All questions answered. ? ?Ms. MaThibeauxoiced understanding and appreciation.  ? ?Medication education handout given to patient. Patient knows to call the office with questions or concerns. Oral Chemotherapy Clinic phone number provided to patient.  ? ?ReLeron CroakPharmD, BCPS ?Hematology/Oncology Clinical Pharmacist ?[facility] ?[id number]/08/2021 11:23 AM ? ?

## 2021-09-07 ENCOUNTER — Telehealth: Payer: Self-pay

## 2021-09-07 ENCOUNTER — Telehealth: Payer: Self-pay | Admitting: *Deleted

## 2021-09-07 ENCOUNTER — Telehealth: Payer: Self-pay | Admitting: Internal Medicine

## 2021-09-07 ENCOUNTER — Ambulatory Visit: Payer: Self-pay | Admitting: Genetic Counselor

## 2021-09-07 DIAGNOSIS — Z1379 Encounter for other screening for genetic and chromosomal anomalies: Secondary | ICD-10-CM

## 2021-09-07 NOTE — Progress Notes (Signed)
HPI:   ?[name] was previously seen in the [facility] clinic due to a personal and family history of cancer and concerns regarding a hereditary predisposition to cancer. Please refer to our prior cancer genetics clinic note for more information regarding our discussion, assessment and recommendations, at the time. [name] recent genetic test results were disclosed to [name], as were recommendations warranted by these results. These results and recommendations are discussed in more detail below. ? ?CANCER HISTORY:  ?Oncology History  ?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)  ?11/21/2020 Initial Diagnosis  ? 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) ? ?  ?11/21/2020 Cancer Staging  ? Staging form: Brain and Spinal Cord, AJCC 8th Edition ?- Pathologic stage from 11/21/2020: WHO Grade IV - Signed by Ventura Sellers, MD on 11/30/2020 ?Histopathologic type: Glioblastoma ?Stage prefix: Initial diagnosis ?Histologic grading system: 4 grade system ?Extent of surgical resection: Subtotal resection ?Karnofsky performance status: Score 90 ?Seizures at presentation: Present ?Duration of symptoms before diagnosis: Short ?IDH1 mutation: Negative ? ?  ?12/27/2020 - 02/07/2021 Radiation Therapy  ? IMRT and concurrent Temodar Isidore Moos) ?  ?03/12/2021 - 08/26/2021 Chemotherapy  ? Completes 5 cycles of adjuvant 5-day Temodar ?  ?08/27/2021 Progression  ? POD #1 ?  ?08/27/2021 -  Chemotherapy  ? Patient is on Treatment Plan : BRAIN Lomustine q42d  ? ?  ?  ?09/06/2021 -  Chemotherapy  ? Patient is on Treatment Plan : BRAIN GBM Bevacizumab 14d x 6 cycles  ? ?  ?  ? ? ?FAMILY HISTORY:  ?We obtained a detailed, 4-generation family history.  Significant diagnoses are listed below: ?     ?Family History  ?Problem Relation Age of Onset  ? Colon cancer Maternal Aunt    ?      aunts and uncles  ? Pancreatic cancer Maternal Aunt    ? Colon cancer Maternal Uncle    ? Lung cancer Other    ?       aunts and uncles  ?  ?  ?[name]'s maternal aunt was diagnosed with colon cancer, she is deceased. She has another maternal aunt diagnosed with pancreatic cancer, she is deceased. [name] maternal uncle was diagnosed with colon cancer, he is deceased.  ?  ?[name] is unaware of previous family history of genetic testing for hereditary cancer risks. There is no reported Ashkenazi Jewish ancestry. ? ?GENETIC TEST RESULTS:  ?The [facility] CancerNext-Expanded Panel found no pathogenic mutations.  ? ?The CancerNext-Expanded gene panel offered by [facility] and includes sequencing, rearrangement, and RNA analysis for the following 77 genes: AIP, ALK, APC, ATM, AXIN2, BAP1, BARD1, BLM, BMPR1A, BRCA1, BRCA2, BRIP1, CDC73, CDH1, CDK4, CDKN1B, CDKN2A, CHEK2, CTNNA1, DICER1, FANCC, FH, FLCN, GALNT12, KIF1B, LZTR1, MAX, MEN1, MET, MLH1, MSH2, MSH3, MSH6, MUTYH, NBN, NF1, NF2, NTHL1, PALB2, PHOX2B, PMS2, POT1, PRKAR1A, PTCH1, PTEN, RAD51C, RAD51D, RB1, RECQL, RET, SDHA, SDHAF2, SDHB, SDHC, SDHD, SMAD4, SMARCA4, SMARCB1, SMARCE1, STK11, SUFU, TMEM127, TP53, TSC1, TSC2, VHL and XRCC2 (sequencing and deletion/duplication); EGFR, EGLN1, HOXB13, KIT, MITF, PDGFRA, POLD1, and POLE (sequencing only); EPCAM and GREM1 (deletion/duplication only).   ? ?The test report has been scanned into EPIC and is located under the Molecular Pathology section of the Results Review tab.  A portion of the result report is included below for reference. Genetic testing reported out on 08/21/2021. ?  ? ? ? ? 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  At this  time, it is unknown if this variant is associated with an increased risk for cancer or if it is benign, but most uncertain variants are reclassified to benign. It should not be used to make medical management decisions. With time, we suspect the laboratory will determine the significance of this variant, if any. If the laboratory  reclassifies this variant, we will attempt to contact [name] to discuss it further.  ? ?Even though a pathogenic variant was not identified, possible explanations for the cancer in the family may include: ?There may be no hereditary risk for cancer in the family. The cancers in [name] and/or [name] family may be due to other genetic or environmental factors. ?There may be a gene mutation in one of these genes that current testing methods cannot detect, but that chance is small. ?There could be another gene that has not yet been discovered, or that we have not yet tested, that is responsible for the cancer diagnoses in the family.  ?It is also possible there is a hereditary cause for the cancer in the family that [name] did not inherit. ? ?Therefore, it is important to remain in touch with cancer genetics in the future so that we can continue to offer [name] the most up to date genetic testing.  ? ?ADDITIONAL GENETIC TESTING:  ?We discussed with [name] that [name] genetic testing was fairly extensive.  If there are genes identified to increase cancer risk that can be analyzed in the future, we would be happy to discuss and coordinate this testing at that time.   ? ?CANCER SCREENING RECOMMENDATIONS:  ?[name]'s test result is considered negative (normal).  This means that we have not identified a hereditary cause for [name] personal and family history of cancer at this time. Most cancers happen by chance and this negative test suggests that [name] cancer may fall into this category.   ? ?An individual's cancer risk and medical management are not determined by genetic test results alone. Overall cancer risk assessment incorporates additional factors, including personal medical history, family history, and any available genetic information that may result in a personalized plan for cancer prevention and surveillance. Therefore, it is recommended she continue to follow the cancer management and screening  guidelines provided by [name] oncology and primary healthcare provider. ? ?RECOMMENDATIONS FOR FAMILY MEMBERS:   ?Since she did not inherit a mutation in a cancer predisposition gene included on this panel, [name] could not have inherited a mutation from [name] in one of these genes. ?Other members of the family may still carry a pathogenic variant in one of these genes that [name] did not inherit. Based on the family history, we recommend [name] mother have genetic counseling and testing.  ?We do not recommend familial testing for the MUTYH variant of uncertain significance (VUS). ? ?FOLLOW-UP:  ?Cancer genetics is a rapidly advancing field and it is possible that new genetic tests will be appropriate for [name] and/or [name] family members in the future. We encouraged [name] to remain in contact with cancer genetics on an annual basis so we can update [name] personal and family histories and let [name] know of advances in cancer genetics that may benefit this family.  ? ?Our contact number was provided. [name] questions were answered to [name] satisfaction, and she knows she is welcome to call us at anytime with additional questions or concerns.  ? ?Lucille Passy, MS, [place] ?Genetic Counselor ?[email or web address] ?[phone] ? ? ?

## 2021-09-07 NOTE — Telephone Encounter (Signed)
Per  5/4 los called and spoke to pt about upcoming appointment  pt confirmed appointment  ?

## 2021-09-07 NOTE — Telephone Encounter (Signed)
-----   Message from Rafael Bihari, RN sent at 09/06/2021  2:01 PM EDT ----- ?Regarding: Dr Mickeal Skinner pt, first time Avastin ?Dr Mickeal Skinner pt, came in 09/06/21 for first time Avastin. Tolerated infusion well. Needs call back.  ? ?

## 2021-09-07 NOTE — Telephone Encounter (Signed)
Called pt to see how she did with her new treatment.  She reports doing well & glad she hasn't had any nausea.  She reports hitting her head in [place] [employer] restroom on something on the wall when she raised her head up & head is sore.  She reports no bleeding or bruising noticed but head hurts where she hit it.  She denies any symptoms such as h/a, dizziness, visual disturbances, seizures, etc.  Informed that this will be reported to MD but if she has any changes to call us.  She expressed understanding & appreciation for call.  ?

## 2021-09-07 NOTE — Telephone Encounter (Signed)
Notified Patient of completion of FMLA paperwork. Fax transmission confirmation received. Copy placed for pick-up as requested by Patient. No other needs or concerns voiced at this time. ?

## 2021-09-20 ENCOUNTER — Inpatient Hospital Stay: Payer: BC Managed Care – PPO

## 2021-09-20 ENCOUNTER — Inpatient Hospital Stay (HOSPITAL_BASED_OUTPATIENT_CLINIC_OR_DEPARTMENT_OTHER): Payer: BC Managed Care – PPO | Admitting: Internal Medicine

## 2021-09-20 ENCOUNTER — Other Ambulatory Visit: Payer: Self-pay

## 2021-09-20 VITALS — BP 138/96 | HR 86

## 2021-09-20 VITALS — BP 137/93 | HR 92 | Temp 97.5°F | Resp 20 | Wt 180.9 lb

## 2021-09-20 DIAGNOSIS — C719 Malignant neoplasm of brain, unspecified: Secondary | ICD-10-CM

## 2021-09-20 DIAGNOSIS — Z5111 Encounter for antineoplastic chemotherapy: Secondary | ICD-10-CM | POA: Diagnosis not present

## 2021-09-20 DIAGNOSIS — C712 Malignant neoplasm of temporal lobe: Secondary | ICD-10-CM | POA: Diagnosis not present

## 2021-09-20 LAB — CBC WITH DIFFERENTIAL (CANCER CENTER ONLY)
Abs Immature Granulocytes: 0.01 10*3/uL (ref 0.00–0.07)
Basophils Absolute: 0 10*3/uL (ref 0.0–0.1)
Basophils Relative: 0 %
Eosinophils Absolute: 0 10*3/uL (ref 0.0–0.5)
Eosinophils Relative: 1 %
HCT: 36.6 % (ref 36.0–46.0)
Hemoglobin: 12.9 g/dL (ref 12.0–15.0)
Immature Granulocytes: 0 %
Lymphocytes Relative: 28 %
Lymphs Abs: 0.8 10*3/uL (ref 0.7–4.0)
MCH: 31 pg (ref 26.0–34.0)
MCHC: 35.2 g/dL (ref 30.0–36.0)
MCV: 88 fL (ref 80.0–100.0)
Monocytes Absolute: 0.4 10*3/uL (ref 0.1–1.0)
Monocytes Relative: 13 %
Neutro Abs: 1.6 10*3/uL — ABNORMAL LOW (ref 1.7–7.7)
Neutrophils Relative %: 58 %
Platelet Count: 177 10*3/uL (ref 150–400)
RBC: 4.16 MIL/uL (ref 3.87–5.11)
RDW: 14.1 % (ref 11.5–15.5)
Smear Review: NORMAL
WBC Count: 2.8 10*3/uL — ABNORMAL LOW (ref 4.0–10.5)
nRBC: 0 % (ref 0.0–0.2)

## 2021-09-20 LAB — CMP (CANCER CENTER ONLY)
ALT: 32 U/L (ref 0–44)
AST: 20 U/L (ref 15–41)
Albumin: 4.3 g/dL (ref 3.5–5.0)
Alkaline Phosphatase: 123 U/L (ref 38–126)
Anion gap: 10 (ref 5–15)
BUN: 8 mg/dL (ref 6–20)
CO2: 24 mmol/L (ref 22–32)
Calcium: 9.5 mg/dL (ref 8.9–10.3)
Chloride: 106 mmol/L (ref 98–111)
Creatinine: 0.84 mg/dL (ref 0.44–1.00)
GFR, Estimated: >60 mL/min (ref >60)
Glucose, Bld: 153 mg/dL — ABNORMAL HIGH (ref 70–99)
Potassium: 3.3 mmol/L — ABNORMAL LOW (ref 3.5–5.1)
Sodium: 140 mmol/L (ref 135–145)
Total Bilirubin: 1.5 mg/dL — ABNORMAL HIGH (ref 0.3–1.2)
Total Protein: 7 g/dL (ref 6.5–8.1)

## 2021-09-20 LAB — TOTAL PROTEIN, URINE DIPSTICK: Protein, ur: 30 mg/dL — AB

## 2021-09-20 MED ORDER — SODIUM CHLORIDE 0.9 % IV SOLN
10.0000 mg/kg | Freq: Once | INTRAVENOUS | Status: AC
  Administered 2021-09-20: 800 mg via INTRAVENOUS
  Filled 2021-09-20: qty 32

## 2021-09-20 MED ORDER — SODIUM CHLORIDE 0.9 % IV SOLN: Freq: Once | INTRAVENOUS | Status: AC

## 2021-09-20 NOTE — Progress Notes (Signed)
Per Dr. Mickeal Skinner- okay to proceed with elevated BP today.

## 2021-09-20 NOTE — Progress Notes (Signed)
[facility] at [place], [place] [address] [phone]   Interval Evaluation  Date of Service: 09/20/21 Patient Name: [name] Patient MRN: [id number] Patient DOB: 06/07/1965 Provider: Ventura Sellers, MD  Identifying Statement:  [name] is a 56 y.o. female with left temporal glioblastoma   Oncologic History: Oncology History  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[place])  11/21/2020 Initial Diagnosis   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[facility])    11/21/2020 Cancer Staging   Staging form: Brain and Spinal Cord, AJCC 8th Edition - Pathologic stage from 11/21/2020: WHO Grade IV - Signed by Ventura Sellers, MD on 11/30/2020 Histopathologic type: Glioblastoma Stage prefix: Initial diagnosis Histologic grading system: 4 grade system Extent of surgical resection: Subtotal resection Karnofsky performance status: Score 90 Seizures at presentation: Present Duration of symptoms before diagnosis: Short IDH1 mutation: Negative    12/27/2020 - 02/07/2021 Radiation Therapy   IMRT and concurrent Temodar Isidore Moos)   03/12/2021 - 08/26/2021 Chemotherapy   Completes 5 cycles of adjuvant 5-day Temodar   08/27/2021 Progression   POD #1   09/06/2021 -  Chemotherapy   Initiates second line therapy; CCNU 47m/m2 PO q6 weeks and avastin 141mkg IV q2 weeks     Biomarkers:  MGMT Methylated.  IDH 1/2 Wild type.  EGFR Unknown  TERT Unknown   Interval History: [name] SANDEFURresents today for avastin infusion.  No clinical changes today.  She dosed first cycle of CCNU on 5/4, no issues with the drug.  No further seizures.  Headaches are sporadic, limited to 15 minutes or less.  Short term memory impairment and fatigue are still persistent.       H+P (11/25/20) Patient presented to medical attention this past month with first ever seizure.  She describes episode of sudden onset  "disconnection" with tongue biting and urination, followed by period of confusion.  CNS imaging demonstrated enhancing mass within left anterior temporal lobe, which was mostly resected by Dr. [name] 11/21/20.  Since surgery she had not had recurrence of seizures.  She complains of painful cramping of her right leg, which started ~1 week prior.  At this time the cramping is her main complaint.  Otherwise fully functional, independent, would like to return to work if possible.  Medications: Current Outpatient Medications on File Prior to Visit  Medication Sig Dispense Refill   amitriptyline (ELAVIL) 50 MG tablet TAKE 1 TABLET BY MOUTH EVERYDAY AT BEDTIME 90 tablet 2   amLODipine (NORVASC) 10 MG tablet Take 1 tablet (10 mg total) by mouth daily. 30 tablet 2   baclofen 5 MG TABS Take 5 mg by mouth 3 (three) times daily as needed for muscle spasms. 60 tablet 1   [employer] D3 125 MCG (5000 UT) capsule TAKE 1 CAPSULE (5,000 UNITS TOTAL) BY MOUTH DAILY. 90 capsule 3   lacosamide (VIMPAT) 50 MG TABS tablet TAKE 1 TABLET BY MOUTH TWICE A DAY 60 tablet 3   levETIRAcetam (KEPPRA) 750 MG tablet TAKE 2 TABLETS (1,500 MG TOTAL) BY MOUTH 2 (TWO) TIMES DAILY. 360 tablet 1   levothyroxine (SYNTHROID) 100 MCG tablet TAKE 1 TABLET BY MOUTH EVERY DAY 90 tablet 3   LORazepam (ATIVAN) 2 MG tablet Take 1 tablet (2 mg total) by mouth every 8 (eight) hours as needed for seizure. 12 tablet 0   ondansetron (ZOFRAN) 8 MG tablet Take 1 tablet (8 mg total) by mouth 2 (two) times daily as needed.  Start on the third day after chemotherapy. 30 tablet 1   Potassium Chloride ER 20 MEQ TBCR TAKE 1 TABLET BY MOUTH EVERY DAY 90 tablet 3   No current facility-administered medications on file prior to visit.    Allergies: No Known Allergies Past Medical History:  Past Medical History:  Diagnosis Date   Bell's palsy    left side of face- notied in smile and eyes, if very tired   Brain cancer ([place])    Complication of anesthesia     N/V   Essential hypertension    External hemorrhoids    History of blood transfusion    post op Hemorroids   History of pneumonia    Hyperlipidemia    Hyperthyroidism    hx 56 years old- oral meds   Iron deficiency anemia    Migraine headache    Pneumonia    x 2 - years ago   PONV (postoperative nausea and vomiting)    PPD positive    Pulmonary nodules    not seen on plain cxr, first detected 1/07 re ct 10/08 pos IPPD > 15 mm 12/09 FOB/lavage 04/20/08 neg afb smear   PVC's (premature ventricular contractions)    Seizure ([place]) 11/15/2020   Sleep apnea    refused CPAP- mouth piece recommended   Tuberculosis    in her late 30's   Past Surgical History:  Past Surgical History:  Procedure Laterality Date   ABDOMINAL HYSTERECTOMY     APPLICATION OF CRANIAL NAVIGATION N/A 11/21/2020   Procedure: APPLICATION OF CRANIAL NAVIGATION;  Surgeon: Judith Part, MD;  Location: [facility];  Service: Neurosurgery;  Laterality: N/A;   BREAST BIOPSY Right    CHOLECYSTECTOMY  2014   COLONOSCOPY  05/04/2009   prolapsing external hemorrhoids   CRANIOTOMY Left 11/21/2020   Procedure: Left craniotomy for tumor resection;  Surgeon: Judith Part, MD;  Location: [facility];  Service: Neurosurgery;  Laterality: Left;   ENDOMETRIAL ABLATION     HEMORRHOID SURGERY     x 3   TUBAL LIGATION     Social History:  Social History   Socioeconomic History   Marital status: Divorced    Spouse name: Not on file   Number of children: 2   Years of education: Not on file   Highest education level: Associate degree: occupational, Hotel manager, or vocational program  Occupational History   Occupation: accounts Sport and exercise psychologist: [employer]  Tobacco Use   Smoking status: Never   Smokeless tobacco: Never  Vaping Use   Vaping Use: Never used  Substance and Sexual Activity   Alcohol use: No    Alcohol/week: 0.0 standard drinks   Drug use: No   Sexual activity: Not on file  Other Topics Concern   Not on  file  Social History Narrative   Divorced with 2 children   She works as a Librarian, 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[employer]    Possible TB exposure 2008 (after nodules found)   From [place] lived there and [place]   Never owned birds      Social Determinants of Radio broadcast assistant Strain: Medium Risk   Difficulty of Paying Living Expenses: Somewhat hard  Food Insecurity: Landscape architect Present   Worried About Charity fundraiser in [place]: Sometimes true   Arboriculturist in the Last Year: Never true  Transportation Needs: No Transportation Needs   Lack of Transportation (Medical): No   Lack of Transportation (Non-Medical): No  Physical Activity: Unknown   Days of Exercise per Week: 0 days   Minutes of Exercise per Session: Not on file  Stress: No Stress Concern Present   Feeling of Stress : Only a little  Social Connections: Moderately Integrated   Frequency of Communication with Friends and Family: Twice a week   Frequency of Social Gatherings with Friends and Family: Twice a week   Attends Religious Services: More than 4 times per year   Active Member of [employer] or Organizations: Yes   Attends Music therapist: More than 4 times per year   Marital Status: Divorced  Human resources officer Violence: Not on file   Family History:  Family History  Problem Relation Age of Onset   Diabetes type II Mother    Kidney failure Sister    Diabetes Mellitus II Sister    Colon cancer Maternal Aunt        aunts and uncles   Diabetes Mellitus II Maternal Aunt    Pancreatic cancer Maternal Aunt    Colon cancer Maternal Uncle    Diabetes Mellitus II Maternal Uncle    Heart disease Maternal Grandmother        aunts, uncles, sister   Diabetes Mellitus II Maternal Grandmother    Lung cancer Other        aunts and uncles    Review of Systems: Constitutional: Doesn't report fevers, chills or abnormal weight loss Eyes: Doesn't report blurriness of vision Ears, nose, mouth, throat, and face:  Doesn't report sore throat Respiratory: Doesn't report cough, dyspnea or wheezes Cardiovascular: Doesn't report palpitation, chest discomfort  Gastrointestinal:  Doesn't report nausea, constipation, diarrhea GU: Doesn't report incontinence Skin: Doesn't report skin rashes Neurological: Per HPI Musculoskeletal: Doesn't report joint pain Behavioral/Psych: +anxiety  Physical Exam:    09/06/2021    9:37 AM 08/27/2021    9:17 AM 08/01/2021   11:35 AM  Vitals with BMI  Height   _0   Weight 181 lbs 10 oz 180 lbs 13 oz 182 lbs 13 oz  BMI  16.5 53.7  Systolic 482 707 867  Diastolic 95 89 93  Pulse 85 92 98    KPS: 90. General: Alert, cooperative, pleasant, in no acute distress Head: Normal EENT: No conjunctival injection or scleral icterus.  Lungs: Resp effort normal Cardiac: Regular rate Abdomen: Non-distended abdomen Skin: No rashes cyanosis or petechiae. Extremities: No clubbing or edema  Neurologic Exam: Mental Status: Awake, alert, attentive to examiner. Oriented to self and environment. Language is fluent with intact comprehension.  Cranial Nerves: Visual acuity is grossly normal. Visual fields are full. Extra-ocular movements intact. No ptosis. Face L LMN paresis, chronic. Motor: Tone and bulk are normal. Power is full in both arms and legs. Reflexes are symmetric, no pathologic reflexes present.  Sensory: Intact to light touch Gait: Normal.   Labs: I have reviewed the data as listed    Component Value Date/Time   NA 142 09/06/2021 0909   K 3.4 (L) 09/06/2021 0909   CL 106 09/06/2021 0909   CO2 26 09/06/2021 0909   GLUCOSE 167 (H) 09/06/2021 0909   BUN 12 09/06/2021 0909   CREATININE 0.90 09/06/2021 0909   CREATININE 0.92 01/25/2020 1424   CALCIUM 9.7 09/06/2021 0909   PROT 7.0 09/06/2021 0909   ALBUMIN 4.4 09/06/2021 0909   AST 16 09/06/2021 0909   ALT 26 09/06/2021 0909   ALKPHOS 135 (H) 09/06/2021 0909   BILITOT 1.4 (H) 09/06/2021 0909   GFRNONAA >60  09/06/2021 0909   GFRNONAA 72 01/25/2020 1424   GFRAA 83 01/25/2020 1424   Lab Results  Component Value Date   WBC 2.8 (L) 09/20/2021   NEUTROABS PENDING 09/20/2021   HGB 12.9 09/20/2021   HCT 36.6 09/20/2021   MCV 88.0 09/20/2021   PLT 177 09/20/2021    Assessment/Plan 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[place]) [C71.9]  [name] is clinically stable today, here for second infusion of Avastin 22m/kg IV.  CCNU cycle #1 is day 15/42.  Patient will continue treatment with oral CCNU 964mm2 q6 weeks and avastin 1068mg IV 2q weeks.  Avastin will be helpful as concurrent therapy given burden of enhancing tumor and steroid requirement.  We reviewed side effects of CCNU, including fatigue, nausea vomiting, cytopenias, ILD.  We reviewed side effects of avastin, including hypertension, bleeding/clotting events, wound healing impairment.  The patient will have a complete blood count, a comprehensive metabolic panel, and urine protein performed prior to each avastin infusion. Labs may need to be performed more often. Zofran will prescribed for home use for nausea/vomiting.   Chemotherapy should be held for the following:  ANC less than 1,000  Platelets less than 100,000  LFT or creatinine greater than 2x ULN  If clinical concerns/contraindications develop  Avastin should be held for the following:  ANC less than 500  Platelets less than 50,000  LFT or creatinine greater than 2x ULN  If clinical concerns/contraindications develop  For seizures, will con't Vimpat 68m43mD and Keppra 1500mg90m.  Will con't baclofen 5mg T74mPRN.  She may dose Ibuprofen 800mg P73mor headaches.  We ask that [name] to clinic in 2 weeks for next avastin treatment, or sooner as needed.  MRI will be scheduled for 10/12/21 prior to cycle #2.  All questions were answered. The patient knows to call the clinic with any problems, questions or concerns. No barriers  to learning were detected.  The total time spent in the encounter was 30 minutes and more than 50% was on counseling and review of test results   ZacharyVentura Sellersdical Director of Neuro-Oncology [facility] [place] [place] 9:31 AM

## 2021-09-21 ENCOUNTER — Telehealth: Payer: Self-pay | Admitting: Internal Medicine

## 2021-09-21 NOTE — Telephone Encounter (Signed)
Per 5/18 los called and spoke to pt about appointments.  Pt confirmed opponents

## 2021-09-24 ENCOUNTER — Other Ambulatory Visit: Payer: Self-pay | Admitting: Internal Medicine

## 2021-09-24 DIAGNOSIS — C719 Malignant neoplasm of brain, unspecified: Secondary | ICD-10-CM

## 2021-09-24 NOTE — Telephone Encounter (Signed)
Patient will have repeat labs prior to redosing of oral chemotherapy.

## 2021-09-25 ENCOUNTER — Other Ambulatory Visit: Payer: Self-pay | Admitting: Adult Health

## 2021-09-25 DIAGNOSIS — Z1231 Encounter for screening mammogram for malignant neoplasm of breast: Secondary | ICD-10-CM

## 2021-09-26 ENCOUNTER — Ambulatory Visit
Admission: RE | Admit: 2021-09-26 | Discharge: 2021-09-26 | Disposition: A | Discharge status: Home / Self Care | Payer: BC Managed Care – PPO | Source: Ambulatory Visit | Attending: Adult Health | Admitting: Adult Health

## 2021-09-26 DIAGNOSIS — Z1231 Encounter for screening mammogram for malignant neoplasm of breast: Secondary | ICD-10-CM

## 2021-10-04 ENCOUNTER — Inpatient Hospital Stay: Payer: BC Managed Care – PPO

## 2021-10-04 ENCOUNTER — Telehealth: Payer: Self-pay | Admitting: *Deleted

## 2021-10-04 ENCOUNTER — Ambulatory Visit: Payer: BC Managed Care – PPO | Admitting: Hematology and Oncology

## 2021-10-04 ENCOUNTER — Ambulatory Visit: Payer: BC Managed Care – PPO

## 2021-10-04 ENCOUNTER — Other Ambulatory Visit: Payer: BC Managed Care – PPO

## 2021-10-04 ENCOUNTER — Inpatient Hospital Stay (HOSPITAL_BASED_OUTPATIENT_CLINIC_OR_DEPARTMENT_OTHER): Payer: BC Managed Care – PPO | Admitting: Internal Medicine

## 2021-10-04 ENCOUNTER — Inpatient Hospital Stay: Payer: BC Managed Care – PPO | Attending: Internal Medicine

## 2021-10-04 ENCOUNTER — Other Ambulatory Visit: Payer: Self-pay

## 2021-10-04 VITALS — BP 144/97 | HR 93 | Temp 97.9°F | Resp 20 | Wt 177.9 lb

## 2021-10-04 DIAGNOSIS — Z5189 Encounter for other specified aftercare: Secondary | ICD-10-CM | POA: Diagnosis not present

## 2021-10-04 DIAGNOSIS — R569 Unspecified convulsions: Secondary | ICD-10-CM | POA: Diagnosis not present

## 2021-10-04 DIAGNOSIS — Z801 Family history of malignant neoplasm of trachea, bronchus and lung: Secondary | ICD-10-CM | POA: Insufficient documentation

## 2021-10-04 DIAGNOSIS — C712 Malignant neoplasm of temporal lobe: Secondary | ICD-10-CM | POA: Insufficient documentation

## 2021-10-04 DIAGNOSIS — C719 Malignant neoplasm of brain, unspecified: Secondary | ICD-10-CM

## 2021-10-04 DIAGNOSIS — Z800 Family history of malignant neoplasm of digestive organs: Secondary | ICD-10-CM | POA: Diagnosis not present

## 2021-10-04 DIAGNOSIS — Z79899 Other long term (current) drug therapy: Secondary | ICD-10-CM | POA: Insufficient documentation

## 2021-10-04 DIAGNOSIS — I10 Essential (primary) hypertension: Secondary | ICD-10-CM

## 2021-10-04 DIAGNOSIS — D696 Thrombocytopenia, unspecified: Secondary | ICD-10-CM | POA: Insufficient documentation

## 2021-10-04 DIAGNOSIS — Z90710 Acquired absence of both cervix and uterus: Secondary | ICD-10-CM | POA: Diagnosis not present

## 2021-10-04 DIAGNOSIS — Z5111 Encounter for antineoplastic chemotherapy: Secondary | ICD-10-CM | POA: Insufficient documentation

## 2021-10-04 LAB — CBC WITH DIFFERENTIAL (CANCER CENTER ONLY)
Abs Immature Granulocytes: 0.01 10*3/uL (ref 0.00–0.07)
Basophils Absolute: 0 10*3/uL (ref 0.0–0.1)
Basophils Relative: 0 %
Eosinophils Absolute: 0.1 10*3/uL (ref 0.0–0.5)
Eosinophils Relative: 3 %
HCT: 35.3 % — ABNORMAL LOW (ref 36.0–46.0)
Hemoglobin: 12.6 g/dL (ref 12.0–15.0)
Immature Granulocytes: 0 %
Lymphocytes Relative: 21 %
Lymphs Abs: 0.6 10*3/uL — ABNORMAL LOW (ref 0.7–4.0)
MCH: 31.4 pg (ref 26.0–34.0)
MCHC: 35.7 g/dL (ref 30.0–36.0)
MCV: 88 fL (ref 80.0–100.0)
Monocytes Absolute: 0.3 10*3/uL (ref 0.1–1.0)
Monocytes Relative: 9 %
Neutro Abs: 2.1 10*3/uL (ref 1.7–7.7)
Neutrophils Relative %: 67 %
Platelet Count: 29 10*3/uL — ABNORMAL LOW (ref 150–400)
RBC: 4.01 MIL/uL (ref 3.87–5.11)
RDW: 14 % (ref 11.5–15.5)
Smear Review: NORMAL
WBC Count: 3 10*3/uL — ABNORMAL LOW (ref 4.0–10.5)
nRBC: 0 % (ref 0.0–0.2)

## 2021-10-04 LAB — TOTAL PROTEIN, URINE DIPSTICK: Protein, ur: 30 mg/dL — AB

## 2021-10-04 LAB — CMP (CANCER CENTER ONLY)
ALT: 20 U/L (ref 0–44)
AST: 14 U/L — ABNORMAL LOW (ref 15–41)
Albumin: 4.3 g/dL (ref 3.5–5.0)
Alkaline Phosphatase: 115 U/L (ref 38–126)
Anion gap: 8 (ref 5–15)
BUN: 12 mg/dL (ref 6–20)
CO2: 26 mmol/L (ref 22–32)
Calcium: 9.9 mg/dL (ref 8.9–10.3)
Chloride: 106 mmol/L (ref 98–111)
Creatinine: 0.82 mg/dL (ref 0.44–1.00)
GFR, Estimated: >60 mL/min (ref >60)
Glucose, Bld: 108 mg/dL — ABNORMAL HIGH (ref 70–99)
Potassium: 3.3 mmol/L — ABNORMAL LOW (ref 3.5–5.1)
Sodium: 140 mmol/L (ref 135–145)
Total Bilirubin: 2.6 mg/dL — ABNORMAL HIGH (ref 0.3–1.2)
Total Protein: 6.8 g/dL (ref 6.5–8.1)

## 2021-10-04 MED ORDER — AMLODIPINE BESYLATE 10 MG PO TABS: 10.0000 mg | ORAL_TABLET | Freq: Every day | ORAL | 2 refills | Status: DC

## 2021-10-04 NOTE — Progress Notes (Signed)
[facility] at [place], [place] [address] [phone]   Interval Evaluation  Date of Service: 10/04/21 Patient Name: [name] Patient MRN: [id number] Patient DOB: 1965/10/06 Provider: Ventura Sellers, MD  Identifying Statement:  [name] is a 56 y.o. female with left temporal glioblastoma   Oncologic History: Oncology History  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[facility])  11/21/2020 Initial Diagnosis   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[facility])    11/21/2020 Cancer Staging   Staging form: Brain and Spinal Cord, AJCC 8th Edition - Pathologic stage from 11/21/2020: WHO Grade IV - Signed by Ventura Sellers, MD on 11/30/2020 Histopathologic type: Glioblastoma Stage prefix: Initial diagnosis Histologic grading system: 4 grade system Extent of surgical resection: Subtotal resection Karnofsky performance status: Score 90 Seizures at presentation: Present Duration of symptoms before diagnosis: Short IDH1 mutation: Negative    12/27/2020 - 02/07/2021 Radiation Therapy   IMRT and concurrent Temodar Isidore Moos)   03/12/2021 - 08/26/2021 Chemotherapy   Completes 5 cycles of adjuvant 5-day Temodar   08/27/2021 Progression   POD #1   09/06/2021 -  Chemotherapy   Initiates second line therapy; CCNU 52m/m2 PO q6 weeks and avastin 159mkg IV q2 weeks     Biomarkers:  MGMT Methylated.  IDH 1/2 Wild type.  EGFR Unknown  TERT Unknown   Interval History: [name] ROCHELresents today for avastin infusion. She describes one week history of cough/cold symptoms.  No fever that she has noticed.  Did have one breakthrough seizure this week during period of illness.  Headaches are sporadic, limited to 15 minutes or less.  Short term memory impairment and fatigue are still persistent.       H+P (11/25/20) Patient presented to medical attention this past month with first ever seizure.  She  describes episode of sudden onset "disconnection" with tongue biting and urination, followed by period of confusion.  CNS imaging demonstrated enhancing mass within left anterior temporal lobe, which was mostly resected by Dr. [name] 11/21/20.  Since surgery she had not had recurrence of seizures.  She complains of painful cramping of her right leg, which started ~1 week prior.  At this time the cramping is her main complaint.  Otherwise fully functional, independent, would like to return to work if possible.  Medications: Current Outpatient Medications on File Prior to Visit  Medication Sig Dispense Refill   amitriptyline (ELAVIL) 50 MG tablet TAKE 1 TABLET BY MOUTH EVERYDAY AT BEDTIME 90 tablet 2   amLODipine (NORVASC) 10 MG tablet Take 1 tablet (10 mg total) by mouth daily. 30 tablet 2   baclofen 5 MG TABS Take 5 mg by mouth 3 (three) times daily as needed for muscle spasms. 60 tablet 1   CVS D3 125 MCG (5000 UT) capsule TAKE 1 CAPSULE (5,000 UNITS TOTAL) BY MOUTH DAILY. 90 capsule 3   lacosamide (VIMPAT) 50 MG TABS tablet TAKE 1 TABLET BY MOUTH TWICE A DAY 60 tablet 3   levETIRAcetam (KEPPRA) 750 MG tablet TAKE 2 TABLETS (1,500 MG TOTAL) BY MOUTH 2 (TWO) TIMES DAILY. 360 tablet 1   levothyroxine (SYNTHROID) 100 MCG tablet TAKE 1 TABLET BY MOUTH EVERY DAY 90 tablet 3   LORazepam (ATIVAN) 2 MG tablet Take 1 tablet (2 mg total) by mouth every 8 (eight) hours as needed for seizure. 12 tablet 0   ondansetron (ZOFRAN) 8 MG tablet Take 1 tablet (8 mg total) by mouth 2 (two)  times daily as needed. Start on the third day after chemotherapy. 30 tablet 1   Potassium Chloride ER 20 MEQ TBCR TAKE 1 TABLET BY MOUTH EVERY DAY 90 tablet 3   No current facility-administered medications on file prior to visit.    Allergies: No Known Allergies Past Medical History:  Past Medical History:  Diagnosis Date   Bell's palsy    left side of face- notied in smile and eyes, if very tired   Brain cancer ([facility])     Complication of anesthesia    N/V   Essential hypertension    External hemorrhoids    History of blood transfusion    post op Hemorroids   History of pneumonia    Hyperlipidemia    Hyperthyroidism    hx 56 years old- oral meds   Iron deficiency anemia    Migraine headache    Pneumonia    x 2 - years ago   PONV (postoperative nausea and vomiting)    PPD positive    Pulmonary nodules    not seen on plain cxr, first detected 1/07 re ct 10/08 pos IPPD > 15 mm 12/09 FOB/lavage 04/20/08 neg afb smear   PVC's (premature ventricular contractions)    Seizure ([facility]) 11/15/2020   Sleep apnea    refused CPAP- mouth piece recommended   Tuberculosis    in her late 30's   Past Surgical History:  Past Surgical History:  Procedure Laterality Date   ABDOMINAL HYSTERECTOMY     APPLICATION OF CRANIAL NAVIGATION N/A 11/21/2020   Procedure: APPLICATION OF CRANIAL NAVIGATION;  Surgeon: Judith Part, MD;  Location: [facility];  Service: Neurosurgery;  Laterality: N/A;   BREAST BIOPSY Right 2014   BREAST BIOPSY Left 10/29/2019   CHOLECYSTECTOMY  2014   COLONOSCOPY  05/04/2009   prolapsing external hemorrhoids   CRANIOTOMY Left 11/21/2020   Procedure: Left craniotomy for tumor resection;  Surgeon: Judith Part, MD;  Location: [facility];  Service: Neurosurgery;  Laterality: Left;   ENDOMETRIAL ABLATION     HEMORRHOID SURGERY     x 3   TUBAL LIGATION     Social History:  Social History   Socioeconomic History   Marital status: Divorced    Spouse name: Not on file   Number of children: 2   Years of education: Not on file   Highest education level: Associate degree: occupational, Hotel manager, or vocational program  Occupational History   Occupation: accounts Sport and exercise psychologist: [employer]  Tobacco Use   Smoking status: Never   Smokeless tobacco: Never  Vaping Use   Vaping Use: Never used  Substance and Sexual Activity   Alcohol use: No    Alcohol/week: 0.0 standard drinks   Drug  use: No   Sexual activity: Not on file  Other Topics Concern   Not on file  Social History Narrative   Divorced with 2 children   She works as a Librarian, 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Press [employer]    Possible TB exposure 2008 (after nodules found)   From [place] lived there and [place]   Never owned birds      Social Determinants of Radio broadcast assistant Strain: Medium Risk   Difficulty of Paying Living Expenses: Somewhat hard  Food Insecurity: Landscape architect Present   Worried About Charity fundraiser in [place]: Sometimes true   Arboriculturist in the Last Year: Never true  Transportation Needs: No Transportation Needs   Lack of Transportation (  Medical): No   Lack of Transportation (Non-Medical): No  Physical Activity: Unknown   Days of Exercise per Week: 0 days   Minutes of Exercise per Session: Not on file  Stress: No Stress Concern Present   Feeling of Stress : Only a little  Social Connections: Moderately Integrated   Frequency of Communication with Friends and Family: Twice a week   Frequency of Social Gatherings with Friends and Family: Twice a week   Attends Religious Services: More than 4 times per year   Active Member of [employer] or Organizations: Yes   Attends Music therapist: More than 4 times per year   Marital Status: Divorced  Human resources officer Violence: Not on file   Family History:  Family History  Problem Relation Age of Onset   Diabetes type II Mother    Kidney failure Sister    Diabetes Mellitus II Sister    Colon cancer Maternal Aunt        aunts and uncles   Diabetes Mellitus II Maternal Aunt    Pancreatic cancer Maternal Aunt    Colon cancer Maternal Uncle    Diabetes Mellitus II Maternal Uncle    Heart disease Maternal Grandmother        aunts, uncles, sister   Diabetes Mellitus II Maternal Grandmother    Lung cancer Other        aunts and uncles    Review of Systems: Constitutional: Doesn't report fevers, chills or abnormal weight loss Eyes:  Doesn't report blurriness of vision Ears, nose, mouth, throat, and face: Doesn't report sore throat Respiratory: Doesn't report cough, dyspnea or wheezes Cardiovascular: Doesn't report palpitation, chest discomfort  Gastrointestinal:  Doesn't report nausea, constipation, diarrhea GU: Doesn't report incontinence Skin: Doesn't report skin rashes Neurological: Per HPI Musculoskeletal: bilateral shoulder pain Behavioral/Psych: +anxiety  Physical Exam:    10/04/2021    9:01 AM 09/20/2021   10:00 AM 09/20/2021    9:28 AM  Vitals with BMI  Weight 177 lbs 14 oz  180 lbs 14 oz  Systolic 122 482 500  Diastolic 97 96 93  Pulse 93 86 92    KPS: 90. General: Alert, cooperative, pleasant, in no acute distress Head: Normal EENT: No conjunctival injection or scleral icterus.  Lungs: Resp effort normal Cardiac: Regular rate Abdomen: Non-distended abdomen Skin: No rashes cyanosis or petechiae. Extremities: No clubbing or edema  Neurologic Exam: Mental Status: Awake, alert, attentive to examiner. Oriented to self and environment. Language is fluent with intact comprehension.  Cranial Nerves: Visual acuity is grossly normal. Visual fields are full. Extra-ocular movements intact. No ptosis. Face L LMN paresis, chronic. Motor: Tone and bulk are normal. Power is full in both arms and legs. Reflexes are symmetric, no pathologic reflexes present.  Sensory: Intact to light touch Gait: Normal.   Labs: I have reviewed the data as listed    Component Value Date/Time   NA 140 09/20/2021 0922   K 3.3 (L) 09/20/2021 0922   CL 106 09/20/2021 0922   CO2 24 09/20/2021 0922   GLUCOSE 153 (H) 09/20/2021 0922   BUN 8 09/20/2021 0922   CREATININE 0.84 09/20/2021 0922   CREATININE 0.92 01/25/2020 1424   CALCIUM 9.5 09/20/2021 0922   PROT 7.0 09/20/2021 0922   ALBUMIN 4.3 09/20/2021 0922   AST 20 09/20/2021 0922   ALT 32 09/20/2021 0922   ALKPHOS 123 09/20/2021 0922   BILITOT 1.5 (H) 09/20/2021 0922    GFRNONAA >60 09/20/2021 0922   GFRNONAA 72  01/25/2020 1424   GFRAA 83 01/25/2020 1424   Lab Results  Component Value Date   WBC 2.8 (L) 09/20/2021   NEUTROABS 1.6 (L) 09/20/2021   HGB 12.9 09/20/2021   HCT 36.6 09/20/2021   MCV 88.0 09/20/2021   PLT 177 09/20/2021    Assessment/Plan 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) [C71.9]  [name] is clinically stable today, for planned third infusion of Avastin 5m/kg IV.  CCNU cycle #1 is now day 29/42.  Avastin treatment will be held today due to thrombocytopenia, likely 2/2 CCNU cycle 1 nadir.  We will bring her back in 1 week for follow up labs.  Patient will continue treatment with oral CCNU 942mm2 q6 weeks and avastin 1042mg IV 2q weeks.  Avastin will be helpful as concurrent therapy given burden of enhancing tumor and steroid requirement.  We reviewed side effects of CCNU, including fatigue, nausea vomiting, cytopenias, ILD.  We reviewed side effects of avastin, including hypertension, bleeding/clotting events, wound healing impairment.  The patient will have a complete blood count, a comprehensive metabolic panel, and urine protein performed prior to each avastin infusion. Labs may need to be performed more often. Zofran will prescribed for home use for nausea/vomiting.   Chemotherapy should be held for the following:  ANC less than 1,000  Platelets less than 100,000  LFT or creatinine greater than 2x ULN  If clinical concerns/contraindications develop  Avastin should be held for the following:  ANC less than 500  Platelets less than 50,000  LFT or creatinine greater than 2x ULN  If clinical concerns/contraindications develop  For seizures, will con't Vimpat 1m63mD and Keppra 1500mg73m.  Will con't baclofen 5mg T45mPRN.  She may dose Tylenol PRN for headaches and joint pain.  We ask that TijuanEMMERSON SHUFFIELDn to clinic in 2 weeks following MRI brain prior to cycle #2, or  sooner as needed.    All questions were answered. The patient knows to call the clinic with any problems, questions or concerns. No barriers to learning were detected.  The total time spent in the encounter was 30 minutes and more than 50% was on counseling and review of test results   ZacharVentura Sellersedical Director of Neuro-Oncology [facility] 9:17 AM

## 2021-10-04 NOTE — Telephone Encounter (Signed)
Scheduler attempted to schedule lab in one week and then lab/md/avastin infusion one more week out on 10/18/2021.  Patient agreed to only scheduling the one week lab.  Was confused about staying on Avastin.  Will follow back up with her next week when she feels better to explain that she will continue Avastin but will no longer stay on the CCNU because of the decreased platelet counts she experienced with that medication.

## 2021-10-11 ENCOUNTER — Other Ambulatory Visit: Payer: Self-pay

## 2021-10-11 ENCOUNTER — Encounter: Payer: Self-pay | Admitting: Hematology

## 2021-10-11 ENCOUNTER — Other Ambulatory Visit: Payer: Self-pay | Admitting: Lab

## 2021-10-11 ENCOUNTER — Telehealth: Payer: Self-pay | Admitting: *Deleted

## 2021-10-11 ENCOUNTER — Inpatient Hospital Stay: Payer: BC Managed Care – PPO

## 2021-10-11 DIAGNOSIS — Z800 Family history of malignant neoplasm of digestive organs: Secondary | ICD-10-CM | POA: Diagnosis not present

## 2021-10-11 DIAGNOSIS — C712 Malignant neoplasm of temporal lobe: Secondary | ICD-10-CM | POA: Diagnosis not present

## 2021-10-11 DIAGNOSIS — Z801 Family history of malignant neoplasm of trachea, bronchus and lung: Secondary | ICD-10-CM | POA: Diagnosis not present

## 2021-10-11 DIAGNOSIS — Z79899 Other long term (current) drug therapy: Secondary | ICD-10-CM | POA: Diagnosis not present

## 2021-10-11 DIAGNOSIS — I10 Essential (primary) hypertension: Secondary | ICD-10-CM | POA: Diagnosis not present

## 2021-10-11 DIAGNOSIS — Z90710 Acquired absence of both cervix and uterus: Secondary | ICD-10-CM | POA: Diagnosis not present

## 2021-10-11 DIAGNOSIS — Z5189 Encounter for other specified aftercare: Secondary | ICD-10-CM | POA: Diagnosis not present

## 2021-10-11 DIAGNOSIS — Z5111 Encounter for antineoplastic chemotherapy: Secondary | ICD-10-CM | POA: Diagnosis not present

## 2021-10-11 DIAGNOSIS — D696 Thrombocytopenia, unspecified: Secondary | ICD-10-CM | POA: Diagnosis not present

## 2021-10-11 DIAGNOSIS — C719 Malignant neoplasm of brain, unspecified: Secondary | ICD-10-CM

## 2021-10-11 LAB — CMP (CANCER CENTER ONLY)
ALT: 15 U/L (ref 0–44)
AST: 15 U/L (ref 15–41)
Albumin: 4.3 g/dL (ref 3.5–5.0)
Alkaline Phosphatase: 123 U/L (ref 38–126)
Anion gap: 7 (ref 5–15)
BUN: 13 mg/dL (ref 6–20)
CO2: 27 mmol/L (ref 22–32)
Calcium: 9.9 mg/dL (ref 8.9–10.3)
Chloride: 105 mmol/L (ref 98–111)
Creatinine: 0.82 mg/dL (ref 0.44–1.00)
GFR, Estimated: >60 mL/min (ref >60)
Glucose, Bld: 109 mg/dL — ABNORMAL HIGH (ref 70–99)
Potassium: 3.5 mmol/L (ref 3.5–5.1)
Sodium: 139 mmol/L (ref 135–145)
Total Bilirubin: 1.4 mg/dL — ABNORMAL HIGH (ref 0.3–1.2)
Total Protein: 6.9 g/dL (ref 6.5–8.1)

## 2021-10-11 LAB — CBC WITH DIFFERENTIAL (CANCER CENTER ONLY)
Abs Immature Granulocytes: 0 10*3/uL (ref 0.00–0.07)
Basophils Absolute: 0 10*3/uL (ref 0.0–0.1)
Basophils Relative: 1 %
Eosinophils Absolute: 0.1 10*3/uL (ref 0.0–0.5)
Eosinophils Relative: 6 %
HCT: 31.1 % — ABNORMAL LOW (ref 36.0–46.0)
Hemoglobin: 11.2 g/dL — ABNORMAL LOW (ref 12.0–15.0)
Immature Granulocytes: 0 %
Lymphocytes Relative: 37 %
Lymphs Abs: 0.7 10*3/uL (ref 0.7–4.0)
MCH: 32.2 pg (ref 26.0–34.0)
MCHC: 36 g/dL (ref 30.0–36.0)
MCV: 89.4 fL (ref 80.0–100.0)
Monocytes Absolute: 0.1 10*3/uL (ref 0.1–1.0)
Monocytes Relative: 5 %
Neutro Abs: 1 10*3/uL — ABNORMAL LOW (ref 1.7–7.7)
Neutrophils Relative %: 51 %
Platelet Count: 35 10*3/uL — ABNORMAL LOW (ref 150–400)
RBC: 3.48 MIL/uL — ABNORMAL LOW (ref 3.87–5.11)
RDW: 13.7 % (ref 11.5–15.5)
WBC Count: 2 10*3/uL — ABNORMAL LOW (ref 4.0–10.5)
nRBC: 0 % (ref 0.0–0.2)

## 2021-10-11 LAB — TOTAL PROTEIN, URINE DIPSTICK: Protein, ur: NEGATIVE mg/dL

## 2021-10-11 NOTE — Progress Notes (Signed)
Patient called regarding assistance with a utility bill.  Patient not currently in active treatment due to test needed to be done before continuing. Also asked patient about gross income and she exceeds the guidelines for household size.   Asked patient if she had tried to reach out to any agencies and she states she has but has not heard anything or over the income limits.  Asked patient if she had tried to make arrangements directly through the company and she states she has not but would try.  She has my name and number for any additional financial questions or concerns.

## 2021-10-11 NOTE — Progress Notes (Signed)
Called patient back to provide additional resource of [employer] whom assists patients whom live in [place].  She states she has completed their application and has received assistance from them but not for utility bill and was provided a reason why. She has their contact information as well.  She will call utility company later once she is feeling a little better to discuss arrangements. Encouraged her to do so.  She has my contact information.

## 2021-10-11 NOTE — Telephone Encounter (Signed)
Attempted to reach patient to communicate her lab results from 10/11/2021 and report to her that Dr Mickeal Skinner would like to keep her appointment Lab/MD/Avastin infusion for 10/18/2021 which will be after her next MRI appointment.    He will not be continuing the CCNU at this but does want to continue Avastin treatment.    Left message for patient pending call back.

## 2021-10-12 ENCOUNTER — Other Ambulatory Visit: Payer: Self-pay | Admitting: Internal Medicine

## 2021-10-12 ENCOUNTER — Other Ambulatory Visit: Payer: Self-pay | Admitting: Radiation Therapy

## 2021-10-15 ENCOUNTER — Ambulatory Visit (HOSPITAL_COMMUNITY)
Admission: RE | Admit: 2021-10-15 | Discharge: 2021-10-15 | Disposition: A | Discharge status: Home / Self Care | Payer: BC Managed Care – PPO | Source: Ambulatory Visit | Attending: Internal Medicine | Admitting: Internal Medicine

## 2021-10-15 DIAGNOSIS — G936 Cerebral edema: Secondary | ICD-10-CM | POA: Diagnosis not present

## 2021-10-15 DIAGNOSIS — C719 Malignant neoplasm of brain, unspecified: Secondary | ICD-10-CM | POA: Diagnosis not present

## 2021-10-15 MED ORDER — GADOBUTROL 1 MMOL/ML IV SOLN
8.0000 mL | Freq: Once | INTRAVENOUS | Status: AC | PRN
  Administered 2021-10-15: 8 mL via INTRAVENOUS

## 2021-10-16 ENCOUNTER — Other Ambulatory Visit: Payer: Self-pay | Admitting: Internal Medicine

## 2021-10-16 MED ORDER — LACOSAMIDE 50 MG PO TABS: 50.0000 mg | ORAL_TABLET | Freq: Two times a day (BID) | ORAL | 3 refills | Status: DC

## 2021-10-16 MED ORDER — LEVETIRACETAM 750 MG PO TABS: 1500.0000 mg | ORAL_TABLET | Freq: Two times a day (BID) | ORAL | 1 refills | Status: DC

## 2021-10-18 ENCOUNTER — Other Ambulatory Visit: Payer: Self-pay

## 2021-10-18 ENCOUNTER — Inpatient Hospital Stay: Payer: BC Managed Care – PPO

## 2021-10-18 ENCOUNTER — Inpatient Hospital Stay (HOSPITAL_BASED_OUTPATIENT_CLINIC_OR_DEPARTMENT_OTHER): Payer: BC Managed Care – PPO | Admitting: Internal Medicine

## 2021-10-18 VITALS — BP 146/94 | HR 85 | Temp 97.6°F | Resp 17 | Ht 68.0 in | Wt 175.0 lb

## 2021-10-18 DIAGNOSIS — D709 Neutropenia, unspecified: Secondary | ICD-10-CM | POA: Insufficient documentation

## 2021-10-18 DIAGNOSIS — T451X5A Adverse effect of antineoplastic and immunosuppressive drugs, initial encounter: Secondary | ICD-10-CM | POA: Diagnosis not present

## 2021-10-18 DIAGNOSIS — Z801 Family history of malignant neoplasm of trachea, bronchus and lung: Secondary | ICD-10-CM | POA: Diagnosis not present

## 2021-10-18 DIAGNOSIS — I10 Essential (primary) hypertension: Secondary | ICD-10-CM

## 2021-10-18 DIAGNOSIS — C712 Malignant neoplasm of temporal lobe: Secondary | ICD-10-CM

## 2021-10-18 DIAGNOSIS — Z800 Family history of malignant neoplasm of digestive organs: Secondary | ICD-10-CM | POA: Diagnosis not present

## 2021-10-18 DIAGNOSIS — Z5189 Encounter for other specified aftercare: Secondary | ICD-10-CM | POA: Diagnosis not present

## 2021-10-18 DIAGNOSIS — D696 Thrombocytopenia, unspecified: Secondary | ICD-10-CM | POA: Diagnosis not present

## 2021-10-18 DIAGNOSIS — D701 Agranulocytosis secondary to cancer chemotherapy: Secondary | ICD-10-CM | POA: Diagnosis not present

## 2021-10-18 DIAGNOSIS — Z5111 Encounter for antineoplastic chemotherapy: Secondary | ICD-10-CM | POA: Diagnosis not present

## 2021-10-18 DIAGNOSIS — Z90710 Acquired absence of both cervix and uterus: Secondary | ICD-10-CM | POA: Diagnosis not present

## 2021-10-18 DIAGNOSIS — C719 Malignant neoplasm of brain, unspecified: Secondary | ICD-10-CM

## 2021-10-18 DIAGNOSIS — Z79899 Other long term (current) drug therapy: Secondary | ICD-10-CM | POA: Diagnosis not present

## 2021-10-18 LAB — CBC WITH DIFFERENTIAL (CANCER CENTER ONLY)
Abs Immature Granulocytes: 0 10*3/uL (ref 0.00–0.07)
Basophils Absolute: 0 10*3/uL (ref 0.0–0.1)
Basophils Relative: 1 %
Eosinophils Absolute: 0.1 10*3/uL (ref 0.0–0.5)
Eosinophils Relative: 4 %
HCT: 31.6 % — ABNORMAL LOW (ref 36.0–46.0)
Hemoglobin: 11.2 g/dL — ABNORMAL LOW (ref 12.0–15.0)
Immature Granulocytes: 0 %
Lymphocytes Relative: 54 %
Lymphs Abs: 0.8 10*3/uL (ref 0.7–4.0)
MCH: 32.3 pg (ref 26.0–34.0)
MCHC: 35.4 g/dL (ref 30.0–36.0)
MCV: 91.1 fL (ref 80.0–100.0)
Monocytes Absolute: 0.2 10*3/uL (ref 0.1–1.0)
Monocytes Relative: 11 %
Neutro Abs: 0.4 10*3/uL — CL (ref 1.7–7.7)
Neutrophils Relative %: 30 %
Platelet Count: 71 10*3/uL — ABNORMAL LOW (ref 150–400)
RBC: 3.47 MIL/uL — ABNORMAL LOW (ref 3.87–5.11)
RDW: 14.3 % (ref 11.5–15.5)
Smear Review: NORMAL
WBC Count: 1.4 10*3/uL — ABNORMAL LOW (ref 4.0–10.5)
nRBC: 0 % (ref 0.0–0.2)

## 2021-10-18 LAB — CMP (CANCER CENTER ONLY)
ALT: 22 U/L (ref 0–44)
AST: 17 U/L (ref 15–41)
Albumin: 4.4 g/dL (ref 3.5–5.0)
Alkaline Phosphatase: 123 U/L (ref 38–126)
Anion gap: 8 (ref 5–15)
BUN: 10 mg/dL (ref 6–20)
CO2: 26 mmol/L (ref 22–32)
Calcium: 10 mg/dL (ref 8.9–10.3)
Chloride: 107 mmol/L (ref 98–111)
Creatinine: 0.88 mg/dL (ref 0.44–1.00)
GFR, Estimated: >60 mL/min (ref >60)
Glucose, Bld: 97 mg/dL (ref 70–99)
Potassium: 3.7 mmol/L (ref 3.5–5.1)
Sodium: 141 mmol/L (ref 135–145)
Total Bilirubin: 1.3 mg/dL — ABNORMAL HIGH (ref 0.3–1.2)
Total Protein: 7 g/dL (ref 6.5–8.1)

## 2021-10-18 LAB — TOTAL PROTEIN, URINE DIPSTICK: Protein, ur: 30 mg/dL — AB

## 2021-10-18 MED ORDER — FILGRASTIM-SNDZ 300 MCG/0.5ML IJ SOSY
300.0000 ug | PREFILLED_SYRINGE | Freq: Once | INTRAMUSCULAR | Status: AC
  Administered 2021-10-18: 300 ug via SUBCUTANEOUS
  Filled 2021-10-18: qty 0.5

## 2021-10-18 NOTE — Progress Notes (Signed)
[name], [place] [address] [phone]   Interval Evaluation  Date of Service: 10/18/21 Patient Name: [name] Patient MRN: [id number] Patient DOB: [birth year] Provider: Ventura Sellers, MD  Identifying Statement:  [name] is a 56 y.o. female with left temporal glioblastoma   Oncologic History: Oncology History  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[place])  11/21/2020 Initial Diagnosis   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[place])   11/21/2020 Cancer Staging   Staging form: Brain and Spinal Cord, AJCC 8th Edition - Pathologic stage from 11/21/2020: WHO Grade IV - Signed by [name] Sellers, MD on 11/30/2020 Histopathologic type: Glioblastoma Stage prefix: Initial diagnosis Histologic grading system: 4 grade system Extent of surgical resection: Subtotal resection Karnofsky performance status: Score 90 Seizures at presentation: Present Duration of symptoms before diagnosis: Short IDH1 mutation: Negative   12/27/2020 - 02/07/2021 Radiation Therapy   IMRT and concurrent Temodar [name])   03/12/2021 - 08/26/2021 Chemotherapy   Completes 5 cycles of adjuvant 5-day Temodar   08/27/2021 Progression   POD #1   09/06/2021 -  Chemotherapy   Initiates second line therapy; CCNU 42m/m2 PO q6 weeks and avastin 118mkg IV q2 weeks     Biomarkers:  MGMT Methylated.  IDH 1/2 Wild type.  EGFR Unknown  TERT Unknown   Interval History: [name] SCHMELINGresents today for avastin infusion. No new or progressive changes.  No further breakthrough seizures since prior visit.  Headaches are sporadic, limited to 15 minutes or less.  Short term memory impairment and fatigue are still persistent.       H+P (11/25/20) Patient presented to medical attention this past month with first ever seizure.  She describes episode of sudden onset "disconnection" with tongue biting and  urination, followed by period of confusion.  CNS imaging demonstrated enhancing mass within left anterior temporal lobe, which was mostly resected by Dr. [name] 11/21/20.  Since surgery she had not had recurrence of seizures.  She complains of painful cramping of her right leg, which started ~1 week prior.  At this time the cramping is her main complaint.  Otherwise fully functional, independent, would like to return to work if possible.  Medications: Current Outpatient Medications on File Prior to Visit  Medication Sig Dispense Refill   amitriptyline (ELAVIL) 50 MG tablet TAKE 1 TABLET BY MOUTH EVERYDAY AT BEDTIME 90 tablet 2   amLODipine (NORVASC) 10 MG tablet Take 1 tablet (10 mg total) by mouth daily. 30 tablet 2   baclofen 5 MG TABS Take 5 mg by mouth 3 (three) times daily as needed for muscle spasms. 60 tablet 1   [employer] D3 125 MCG (5000 UT) capsule TAKE 1 CAPSULE (5,000 UNITS TOTAL) BY MOUTH DAILY. 90 capsule 3   lacosamide (VIMPAT) 50 MG TABS tablet Take 1 tablet (50 mg total) by mouth 2 (two) times daily. 60 tablet 3   levETIRAcetam (KEPPRA) 750 MG tablet Take 2 tablets (1,500 mg total) by mouth 2 (two) times daily. 360 tablet 1   levothyroxine (SYNTHROID) 100 MCG tablet TAKE 1 TABLET BY MOUTH EVERY DAY 90 tablet 3   LORazepam (ATIVAN) 2 MG tablet Take 1 tablet (2 mg total) by mouth every 8 (eight) hours as needed for seizure. 12 tablet 0   ondansetron (ZOFRAN) 8 MG tablet Take 1 tablet (8 mg total) by mouth 2 (two) times daily as needed. Start on the third day after chemotherapy. [place]  tablet 1   Potassium Chloride ER 20 MEQ TBCR TAKE 1 TABLET BY MOUTH EVERY DAY 90 tablet 3   No current facility-administered medications on file prior to visit.    Allergies: No Known Allergies Past Medical History:  Past Medical History:  Diagnosis Date   Bell's palsy    left side of face- notied in smile and eyes, if very tired   Brain cancer ([place])    Complication of anesthesia    N/V   Essential  hypertension    External hemorrhoids    History of blood transfusion    post op Hemorroids   History of pneumonia    Hyperlipidemia    Hyperthyroidism    hx 56 years old- oral meds   Iron deficiency anemia    Migraine headache    Pneumonia    x 2 - years ago   PONV (postoperative nausea and vomiting)    PPD positive    Pulmonary nodules    not seen on plain cxr, first detected 1/07 re ct 10/08 pos IPPD > 15 mm 12/09 FOB/lavage 04/20/08 neg afb smear   PVC's (premature ventricular contractions)    Seizure ([place]) 11/15/2020   Sleep apnea    refused CPAP- mouth piece recommended   Tuberculosis    in her late 30's   Past Surgical History:  Past Surgical History:  Procedure Laterality Date   ABDOMINAL HYSTERECTOMY     APPLICATION OF CRANIAL NAVIGATION N/A 11/21/2020   Procedure: APPLICATION OF CRANIAL NAVIGATION;  Surgeon: Judith Part, MD;  Location: [facility];  Service: Neurosurgery;  Laterality: N/A;   BREAST BIOPSY Right 2014   BREAST BIOPSY Left 10/29/2019   CHOLECYSTECTOMY  2014   COLONOSCOPY  05/04/2009   prolapsing external hemorrhoids   CRANIOTOMY Left 11/21/2020   Procedure: Left craniotomy for tumor resection;  Surgeon: Judith Part, MD;  Location: [facility];  Service: Neurosurgery;  Laterality: Left;   ENDOMETRIAL ABLATION     HEMORRHOID SURGERY     x 3   TUBAL LIGATION     Social History:  Social History   Socioeconomic History   Marital status: Divorced    Spouse name: Not on file   Number of children: 2   Years of education: Not on file   Highest education level: Associate degree: occupational, Hotel manager, or vocational program  Occupational History   Occupation: accounts Sport and exercise psychologist: [employer]  Tobacco Use   Smoking status: Never   Smokeless tobacco: Never  Vaping Use   Vaping Use: Never used  Substance and Sexual Activity   Alcohol use: No    Alcohol/week: 0.0 standard drinks of alcohol   Drug use: No   Sexual activity: Not on file   Other Topics Concern   Not on file  Social History Narrative   Divorced with 2 children   She works as a Librarian, 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[employer]    Possible TB exposure 2008 (after nodules found)   From [place] lived there and [place]   Never owned birds      Social Determinants of [place] Strain: [place] (06/13/2021)   Overall Financial Resource Strain (CARDIA)    Difficulty of Paying Living Expenses: Somewhat hard  Food Insecurity: Food Insecurity Present (04/23/2021)   Hunger Vital Sign    Worried About Running Out of Food in the Last Year: Sometimes true    Ran Out of Food in the Last Year: Never true  Transportation Needs: No  Transportation Needs (06/13/2021)   PRAPARE - Hydrologist (Medical): No    Lack of Transportation (Non-Medical): No  Physical Activity: Unknown (04/23/2021)   Exercise Vital Sign    Days of Exercise per Week: 0 days    Minutes of Exercise per Session: Not on file  Stress: No Stress Concern Present (04/23/2021)   [place]    Feeling of Stress : Only a little  Social Connections: Moderately Integrated (04/23/2021)   Social Connection and Isolation Panel [NHANES]    Frequency of Communication with Friends and Family: Twice a week    Frequency of Social Gatherings with Friends and Family: Twice a week    Attends Religious Services: More than 4 times per year    Active Member of [employer] or Organizations: Yes    Attends Music therapist: More than 4 times per year    Marital Status: Divorced  Human resources officer Violence: Not on file   Family History:  Family History  Problem Relation Age of Onset   Diabetes type II Mother    Kidney failure Sister    Diabetes Mellitus II Sister    Colon cancer Maternal Aunt        aunts and uncles   Diabetes Mellitus II Maternal Aunt    Pancreatic cancer Maternal Aunt    Colon cancer Maternal Uncle    Diabetes  Mellitus II Maternal Uncle    Heart disease Maternal Grandmother        aunts, uncles, sister   Diabetes Mellitus II Maternal Grandmother    Lung cancer Other        aunts and uncles    Review of Systems: Constitutional: Doesn't report fevers, chills or abnormal weight loss Eyes: Doesn't report blurriness of vision Ears, nose, mouth, throat, and face: Doesn't report sore throat Respiratory: Doesn't report cough, dyspnea or wheezes Cardiovascular: Doesn't report palpitation, chest discomfort  Gastrointestinal:  Doesn't report nausea, constipation, diarrhea GU: Doesn't report incontinence Skin: Doesn't report skin rashes Neurological: Per HPI Musculoskeletal: bilateral shoulder pain Behavioral/Psych: +anxiety  Physical Exam:    10/18/2021    9:31 AM 10/04/2021    9:01 AM 09/20/2021   10:00 AM  Vitals with BMI  Height _0     Weight 175 lbs 177 lbs 14 oz   BMI 96.22    Systolic 297 989 211  Diastolic 94 97 96  Pulse 85 93 86    KPS: 90. General: Alert, cooperative, pleasant, in no acute distress Head: Normal EENT: No conjunctival injection or scleral icterus.  Lungs: Resp effort normal Cardiac: Regular rate Abdomen: Non-distended abdomen Skin: No rashes cyanosis or petechiae. Extremities: No clubbing or edema  Neurologic Exam: Mental Status: Awake, alert, attentive to examiner. Oriented to self and environment. Language is fluent with intact comprehension.  Cranial Nerves: Visual acuity is grossly normal. Visual fields are full. Extra-ocular movements intact. No ptosis. Face L LMN paresis, chronic. Motor: Tone and bulk are normal. Power is full in both arms and legs. Reflexes are symmetric, no pathologic reflexes present.  Sensory: Intact to light touch Gait: Normal.   Labs: I have reviewed the data as listed    Component Value Date/Time   NA 139 10/11/2021 0927   K 3.5 10/11/2021 0927   CL 105 10/11/2021 0927   CO2 27 10/11/2021 0927   GLUCOSE 109 (H)  10/11/2021 0927   BUN 13 10/11/2021 0927   CREATININE 0.82 10/11/2021 0927  CREATININE 0.92 01/25/2020 1424   CALCIUM 9.9 10/11/2021 0927   PROT 6.9 10/11/2021 0927   ALBUMIN 4.3 10/11/2021 0927   AST 15 10/11/2021 0927   ALT 15 10/11/2021 0927   ALKPHOS 123 10/11/2021 0927   BILITOT 1.4 (H) 10/11/2021 0927   GFRNONAA >60 10/11/2021 0927   GFRNONAA 72 01/25/2020 1424   GFRAA 83 01/25/2020 1424   Lab Results  Component Value Date   WBC 1.4 (L) 10/18/2021   NEUTROABS PENDING 10/18/2021   HGB 11.2 (L) 10/18/2021   HCT 31.6 (L) 10/18/2021   MCV 91.1 10/18/2021   PLT 71 (L) 10/18/2021   Imaging:  [place] Clinician Interpretation: I have personally reviewed the CNS images as listed.  My interpretation, in the context of the patient's clinical presentation, is stable disease  MR BRAIN W WO CONTRAST  Result Date: 10/16/2021 CLINICAL DATA:  Brain/CNS neoplasm. Assess treatment response. Glioblastoma. Glioblastoma with Isocitrate dehydrogenase gene wild-type. EXAM: MRI HEAD WITHOUT AND WITH CONTRAST TECHNIQUE: Multiplanar, multiecho pulse sequences of the brain and surrounding structures were obtained without and with intravenous contrast. CONTRAST:  65m GADAVIST GADOBUTROL 1 MMOL/ML IV SOLN COMPARISON:  MRI 08/24/2021, 07/27/2021, 06/22/2021. FINDINGS: Brain: Marked improvement since the previous examination. Brainstem and cerebellum remain normal. Right cerebral hemisphere Mains normal. On the left, there is been marked reduction of enhancement both at the main site of disease in the periatrial deep brain on the left and at the site of secondary disease enhancement at the anterior inferior left frontal lobe. There is less vasogenic edema and enhancement within the left periatrial region. Post resective changes of the left temporal tip are stable. There is no evidence of worsening disease. No increased enhancement, edema or mass effect. No hydrocephalus. No extra-axial collection. Vascular: Major  vessels at the base of the brain show flow. Skull and upper cervical spine: Craniotomy changes on the left. No new finding. Sinuses/Orbits: Clear except for mild mucosal thickening of the maxillary sinus floors. Orbits negative. Other: None IMPRESSION: Pronounced favorable changes. Marked reduction of abnormal enhancement at the anterior inferior left frontal lobe and in the left periatrial deep brain. Reduction in the amount of vasogenic edema in the left periatrial region. No worsening or new finding. Electronically Signed   By: MNelson ChimesM.D.   On: 10/16/2021 12:53   MM 3D SCREEN BREAST BILATERAL  Result Date: 09/27/2021 CLINICAL DATA:  Screening. EXAM: DIGITAL SCREENING BILATERAL MAMMOGRAM WITH TOMOSYNTHESIS AND CAD TECHNIQUE: Bilateral screening digital craniocaudal and mediolateral oblique mammograms were obtained. Bilateral screening digital breast tomosynthesis was performed. The images were evaluated with computer-aided detection. COMPARISON:  Previous exam(s). ACR Breast Density Category c: The breast tissue is heterogeneously dense, which may obscure small masses. FINDINGS: There are no findings suspicious for malignancy. IMPRESSION: No mammographic evidence of malignancy. A result letter of this screening mammogram will be mailed directly to the patient. RECOMMENDATION: Screening mammogram in one year. (Code:SM-B-01Y) BI-RADS CATEGORY  1: Negative. Electronically Signed   By: SFranki CabotM.D.   On: 09/27/2021 13:23    Assessment/Plan 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[facility] [C71.9]  Marybel MRAETTA AGOSTINELLIis clinically stable today, now having completed cycle #1 CCNU/avastin.  MRI brain demonstrates stable findings with avastin treatment effect accounting for some of diminished burden of enhancement.  Avastin treatment and further CCNU will be held today due to neutropenia, likely 2/2 CCNU cycle 1 nadir.  She will proceed instead today with Filgrastim injection.   Neutropenic precautions were reviewed.  Patient will continue treatment with oral CCNU 79m/m2 q6 weeks and avastin 134mkg IV 2q weeks.  Avastin will be helpful as concurrent therapy given burden of enhancing tumor and steroid requirement.  We reviewed side effects of CCNU, including fatigue, nausea vomiting, cytopenias, ILD.  We reviewed side effects of avastin, including hypertension, bleeding/clotting events, wound healing impairment.  The patient will have a complete blood count, a comprehensive metabolic panel, and urine protein performed prior to each avastin infusion. Labs may need to be performed more often. Zofran will prescribed for home use for nausea/vomiting.   Chemotherapy should be held for the following:  ANC less than 1,000  Platelets less than 100,000  LFT or creatinine greater than 2x ULN  If clinical concerns/contraindications develop  Avastin should be held for the following:  ANC less than 500  Platelets less than 50,000  LFT or creatinine greater than 2x ULN  If clinical concerns/contraindications develop  For seizures, will con't Vimpat 5049mID and Keppra 1500m26mD.  Will con't baclofen 5mg 71m PRN.  She may dose Tylenol PRN for headaches and joint pain.  We ask that TijuaTAKARI DUNCOMBErn to clinic in 2 weeks with labs for evaluation, and hopefully to resume systemic therapy.  All questions were answered. The patient knows to call the clinic with any problems, questions or concerns. No barriers to learning were detected.  The total time spent in the encounter was 40 minutes and more than 50% was on counseling and review of test results   ZachaVentura SellersMedical Director of Neuro-Oncology [facility] HospitalesleHobe Sound5/23 9:35 AM

## 2021-10-18 NOTE — Patient Instructions (Signed)
Filgrastim, G-CSF injection ?What is this medication? ?FILGRASTIM, G-CSF (fil GRA stim) is a granulocyte colony-stimulating factor that stimulates the growth of neutrophils, a type of white blood cell (WBC) important in the body's fight against infection. It is used to reduce the incidence of fever and infection in patients with certain types of cancer who are receiving chemotherapy that affects the bone marrow, to stimulate blood cell production for removal of WBCs from the body prior to a bone marrow transplantation, to reduce the incidence of fever and infection in patients who have severe chronic neutropenia, and to improve survival outcomes following high-dose radiation exposure that is toxic to the bone marrow. ?This medicine may be used for other purposes; ask your health care provider or pharmacist if you have questions. ?COMMON BRAND NAME(S): Neupogen, Nivestym, Releuko, Zarxio ?What should I tell my care team before I take this medication? ?They need to know if you have any of these conditions: ?kidney disease ?latex allergy ?ongoing radiation therapy ?sickle cell disease ?an unusual or allergic reaction to filgrastim, pegfilgrastim, other medicines, foods, dyes, or preservatives ?pregnant or trying to get pregnant ?breast-feeding ?How should I use this medication? ?This medicine is for injection under the skin or infusion into a vein. As an infusion into a vein, it is usually given by a health care professional in a hospital or clinic setting. If you get this medicine at home, you will be taught how to prepare and give this medicine. Refer to the Instructions for Use that come with your medication packaging. Use exactly as directed. Take your medicine at regular intervals. Do not take your medicine more often than directed. ?It is important that you put your used needles and syringes in a special sharps container. Do not put them in a trash can. If you do not have a sharps container, call your pharmacist  or healthcare provider to get one. ?Talk to your pediatrician regarding the use of this medicine in children. While this drug may be prescribed for children as young as 7 months for selected conditions, precautions do apply. ?Overdosage: If you think you have taken too much of this medicine contact a poison control center or emergency room at once. ?NOTE: This medicine is only for you. Do not share this medicine with others. ?What if I miss a dose? ?It is important not to miss your dose. Call your doctor or health care professional if you miss a dose. ?What may interact with this medication? ?This medicine may interact with the following medications: ?medicines that may cause a release of neutrophils, such as lithium ?This list may not describe all possible interactions. Give your health care provider a list of all the medicines, herbs, non-prescription drugs, or dietary supplements you use. Also tell them if you smoke, drink alcohol, or use illegal drugs. Some items may interact with your medicine. ?What should I watch for while using this medication? ?Your condition will be monitored carefully while you are receiving this medicine. ?You may need blood work done while you are taking this medicine. ?Talk to your health care provider about your risk of cancer. You may be more at risk for certain types of cancer if you take this medicine. ?What side effects may I notice from receiving this medication? ?Side effects that you should report to your doctor or health care professional as soon as possible: ?allergic reactions like skin rash, itching or hives, swelling of the face, lips, or tongue ?back pain ?dizziness or feeling faint ?fever ?pain, redness, or   irritation at site where injected ?pinpoint red spots on the skin ?shortness of breath or breathing problems ?signs and symptoms of kidney injury like trouble passing urine, change in the amount of urine, or red or dark-brown urine ?stomach or side pain, or pain at  the shoulder ?swelling ?tiredness ?unusual bleeding or bruising ?Side effects that usually do not require medical attention (report to your doctor or health care professional if they continue or are bothersome): ?bone pain ?cough ?diarrhea ?hair loss ?headache ?muscle pain ?This list may not describe all possible side effects. Call your doctor for medical advice about side effects. You may report side effects to FDA at 1-800-FDA-1088. ?Where should I keep my medication? ?Keep out of the reach of children. ?Store in a refrigerator between 2 and 8 degrees C (36 and 46 degrees F). Do not freeze. Keep in carton to protect from light. Throw away this medicine if vials or syringes are left out of the refrigerator for more than 24 hours. Throw away any unused medicine after the expiration date. ?NOTE: This sheet is a summary. It may not cover all possible information. If you have questions about this medicine, talk to your doctor, pharmacist, or health care provider. ?? 2023 Elsevier/Gold Standard (2021-03-23 00:00:00) ? ?

## 2021-10-18 NOTE — Progress Notes (Signed)
CRITICAL VALUE STICKER  CRITICAL VALUE: ANC 0.4  RECEIVER (on-site recipient of call): Morey Hummingbird, [place] NOTIFIED: 10/18/21 09:58 AM  MESSENGER (representative from lab): Billee Cashing  MD NOTIFIED: Dr. Mickeal Skinner and Julianne Rice, RN  TIME OF NOTIFICATION: 09:59 AM

## 2021-10-19 ENCOUNTER — Telehealth: Payer: Self-pay | Admitting: Internal Medicine

## 2021-10-19 ENCOUNTER — Inpatient Hospital Stay: Payer: BC Managed Care – PPO | Admitting: Dietician

## 2021-10-19 NOTE — Telephone Encounter (Signed)
Per 6/15 los called and spoke to pt about appointment.  Pt confirmed appointment

## 2021-10-22 ENCOUNTER — Inpatient Hospital Stay: Payer: BC Managed Care – PPO

## 2021-10-25 ENCOUNTER — Ambulatory Visit (INDEPENDENT_AMBULATORY_CARE_PROVIDER_SITE_OTHER): Payer: BC Managed Care – PPO | Admitting: Adult Health

## 2021-10-25 ENCOUNTER — Encounter: Payer: Self-pay | Admitting: Adult Health

## 2021-10-25 ENCOUNTER — Other Ambulatory Visit: Payer: BC Managed Care – PPO

## 2021-10-25 ENCOUNTER — Ambulatory Visit (INDEPENDENT_AMBULATORY_CARE_PROVIDER_SITE_OTHER): Payer: BC Managed Care – PPO

## 2021-10-25 VITALS — BP 122/82 | HR 100 | Temp 98.9°F | Ht 68.0 in | Wt 171.0 lb

## 2021-10-25 DIAGNOSIS — M25512 Pain in left shoulder: Secondary | ICD-10-CM

## 2021-10-25 MED ORDER — TRAMADOL HCL 50 MG PO TABS: 50.0000 mg | ORAL_TABLET | Freq: Three times a day (TID) | ORAL | 0 refills | Status: AC | PRN

## 2021-10-25 NOTE — Progress Notes (Signed)
Subjective:    Patient ID: [name], female    DOB: [birth year], 56 y.o.   MRN: [id number]  HPI 56 year old female who  has a past medical history of Bell's palsy, Brain cancer ([place]), Complication of anesthesia, Essential hypertension, External hemorrhoids, History of blood transfusion, History of pneumonia, Hyperlipidemia, Hyperthyroidism, Iron deficiency anemia, Migraine headache, Pneumonia, PONV (postoperative nausea and vomiting), PPD positive, Pulmonary nodules, PVC's (premature ventricular contractions), Seizure ([place]) (11/15/2020), Sleep apnea, and Tuberculosis.  She presents to the office today for an acute issue of left shoulder pain that has been present for roughly 1 month.  She reports that symptoms appeared suddenly and denies trauma or aggravating injury.  She has limited range of motion and decreased grip strength in her left hand.  She has not been using anything over-the-counter to help with the discomfort.   Review of Systems See HPI   Past Medical History:  Diagnosis Date   Bell's palsy    left side of face- notied in smile and eyes, if very tired   Brain cancer ([place])    Complication of anesthesia    N/V   Essential hypertension    External hemorrhoids    History of blood transfusion    post op Hemorroids   History of pneumonia    Hyperlipidemia    Hyperthyroidism    hx 56 years old- oral meds   Iron deficiency anemia    Migraine headache    Pneumonia    x 2 - years ago   PONV (postoperative nausea and vomiting)    PPD positive    Pulmonary nodules    not seen on plain cxr, first detected 1/07 re ct 10/08 pos IPPD > 15 mm 12/09 FOB/lavage 04/20/08 neg afb smear   PVC's (premature ventricular contractions)    Seizure ([place]) 11/15/2020   Sleep apnea    refused CPAP- mouth piece recommended   Tuberculosis    in her late 16's    Social History   Socioeconomic History   Marital status: Divorced    Spouse name: Not on file   Number of children: 2    Years of education: Not on file   Highest education level: Associate degree: occupational, Hotel manager, or vocational program  Occupational History   Occupation: accounts Sport and exercise psychologist: [employer]  Tobacco Use   Smoking status: Never   Smokeless tobacco: Never  Vaping Use   Vaping Use: Never used  Substance and Sexual Activity   Alcohol use: No    Alcohol/week: 0.0 standard drinks of alcohol   Drug use: No   Sexual activity: Not on file  Other Topics Concern   Not on file  Social History Narrative   Divorced with 2 children   She works as a Librarian, 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Press photographer    Possible TB exposure 2008 (after nodules found)   From [place] lived there and [place]   Never owned birds      Social Determinants of Health   Financial Resource Strain: Medium Risk (06/13/2021)   Overall Financial Resource Strain (CARDIA)    Difficulty of Paying Living Expenses: Somewhat hard  Food Insecurity: Food Insecurity Present (04/23/2021)   Hunger Vital Sign    Worried About Running Out of Food in the Last Year: Sometimes true    Ran Out of Food in the Last Year: Never true  Transportation Needs: No Transportation Needs (06/13/2021)   PRAPARE - Hydrologist (Medical): No  Lack of Transportation (Non-Medical): No  Physical Activity: Unknown (04/23/2021)   Exercise Vital Sign    Days of Exercise per Week: 0 days    Minutes of Exercise per Session: Not on file  Stress: No Stress Concern Present (04/23/2021)   [place]    Feeling of Stress : Only a little  Social Connections: Moderately Integrated (04/23/2021)   Social Connection and Isolation Panel [NHANES]    Frequency of Communication with Friends and Family: Twice a week    Frequency of Social Gatherings with Friends and Family: Twice a week    Attends Religious Services: More than 4 times per year    Active Member of [employer] or Organizations: Yes     Attends Music therapist: More than 4 times per year    Marital Status: Divorced  Human resources officer Violence: Not on file    Past Surgical History:  Procedure Laterality Date   ABDOMINAL HYSTERECTOMY     APPLICATION OF CRANIAL NAVIGATION N/A 11/21/2020   Procedure: [place];  Surgeon: Judith Part, MD;  Location: [facility];  Service: Neurosurgery;  Laterality: N/A;   BREAST BIOPSY Right 2014   BREAST BIOPSY Left 10/29/2019   CHOLECYSTECTOMY  2014   COLONOSCOPY  05/04/2009   prolapsing external hemorrhoids   CRANIOTOMY Left 11/21/2020   Procedure: Left craniotomy for tumor resection;  Surgeon: Judith Part, MD;  Location: [facility];  Service: Neurosurgery;  Laterality: Left;   ENDOMETRIAL ABLATION     HEMORRHOID SURGERY     x 3   TUBAL LIGATION      Family History  Problem Relation Age of Onset   Diabetes type II Mother    Kidney failure Sister    Diabetes Mellitus II Sister    Colon cancer Maternal Aunt        aunts and uncles   Diabetes Mellitus II Maternal Aunt    Pancreatic cancer Maternal Aunt    Colon cancer Maternal Uncle    Diabetes Mellitus II Maternal Uncle    Heart disease Maternal Grandmother        aunts, uncles, sister   Diabetes Mellitus II Maternal Grandmother    Lung cancer Other        aunts and uncles    No Known Allergies  Current Outpatient Medications on File Prior to Visit  Medication Sig Dispense Refill   amitriptyline (ELAVIL) 50 MG tablet TAKE 1 TABLET BY MOUTH EVERYDAY AT BEDTIME 90 tablet 2   amLODipine (NORVASC) 10 MG tablet Take 1 tablet (10 mg total) by mouth daily. 30 tablet 2   baclofen 5 MG TABS Take 5 mg by mouth 3 (three) times daily as needed for muscle spasms. 60 tablet 1   [employer] D3 125 MCG (5000 UT) capsule TAKE 1 CAPSULE (5,000 UNITS TOTAL) BY MOUTH DAILY. 90 capsule 3   lacosamide (VIMPAT) 50 MG TABS tablet Take 1 tablet (50 mg total) by mouth 2 (two) times daily. 60 tablet 3    levETIRAcetam (KEPPRA) 750 MG tablet Take 2 tablets (1,500 mg total) by mouth 2 (two) times daily. 360 tablet 1   levothyroxine (SYNTHROID) 100 MCG tablet TAKE 1 TABLET BY MOUTH EVERY DAY 90 tablet 3   LORazepam (ATIVAN) 2 MG tablet Take 1 tablet (2 mg total) by mouth every 8 (eight) hours as needed for seizure. 12 tablet 0   ondansetron (ZOFRAN) 8 MG tablet Take 1 tablet (8 mg total) by  mouth 2 (two) times daily as needed. Start on the third day after chemotherapy. 30 tablet 1   Potassium Chloride ER 20 MEQ TBCR TAKE 1 TABLET BY MOUTH EVERY DAY 90 tablet 3   No current facility-administered medications on file prior to visit.    BP 122/82   Pulse 100   Temp 98.9 F (37.2 C) (Oral)   Ht '5\' 8"'$  (1.727 m)   Wt 171 lb (77.6 kg)   SpO2 98%   BMI 26.00 kg/m       Objective:   Physical Exam Vitals and nursing note reviewed.  Constitutional:      Appearance: Normal appearance.  Musculoskeletal:        General: Tenderness present.     Left shoulder: Tenderness present. No swelling or deformity. Decreased range of motion. Decreased strength. Normal pulse.  Skin:    Capillary Refill: Capillary refill takes less than 2 seconds.  Neurological:     General: No focal deficit present.     Mental Status: She is alert and oriented to person, place, and time.  Psychiatric:        Mood and Affect: Mood normal.        Behavior: Behavior normal.        Thought Content: Thought content normal.        Judgment: Judgment normal.       Assessment & Plan:  1. Acute pain of left shoulder -Frozen shoulder versus rotator cuff injury. -We will get x-ray of left shoulder today and prescribe short course of tramadol for pain relief.  X-ray of shoulder will likely result tomorrow or over the weekend.  Since I am out the office next week will refer over to sports medicine for further evaluation. - Placed in shoulder immobilizer which relieved some discomfort  - DG Shoulder Left; Future - Ambulatory  referral to Sports Medicine - traMADol (ULTRAM) 50 MG tablet; Take 1 tablet (50 mg total) by mouth every 8 (eight) hours as needed for up to 5 days.  Dispense: 15 tablet; Refill: 0  [name], NP

## 2021-10-26 ENCOUNTER — Other Ambulatory Visit: Payer: Self-pay | Admitting: Adult Health

## 2021-10-26 DIAGNOSIS — E039 Hypothyroidism, unspecified: Secondary | ICD-10-CM

## 2021-11-01 ENCOUNTER — Inpatient Hospital Stay: Payer: BC Managed Care – PPO | Admitting: Nutrition

## 2021-11-01 ENCOUNTER — Inpatient Hospital Stay (HOSPITAL_BASED_OUTPATIENT_CLINIC_OR_DEPARTMENT_OTHER): Payer: BC Managed Care – PPO | Admitting: Internal Medicine

## 2021-11-01 ENCOUNTER — Inpatient Hospital Stay: Payer: BC Managed Care – PPO

## 2021-11-01 ENCOUNTER — Encounter: Payer: Self-pay | Admitting: Internal Medicine

## 2021-11-01 VITALS — BP 141/94 | HR 82 | Temp 97.9°F | Resp 18 | Ht 68.0 in | Wt 173.9 lb

## 2021-11-01 DIAGNOSIS — C719 Malignant neoplasm of brain, unspecified: Secondary | ICD-10-CM | POA: Diagnosis not present

## 2021-11-01 DIAGNOSIS — Z5189 Encounter for other specified aftercare: Secondary | ICD-10-CM | POA: Diagnosis not present

## 2021-11-01 DIAGNOSIS — R569 Unspecified convulsions: Secondary | ICD-10-CM | POA: Diagnosis not present

## 2021-11-01 DIAGNOSIS — Z79899 Other long term (current) drug therapy: Secondary | ICD-10-CM | POA: Diagnosis not present

## 2021-11-01 DIAGNOSIS — I10 Essential (primary) hypertension: Secondary | ICD-10-CM | POA: Diagnosis not present

## 2021-11-01 DIAGNOSIS — Z5111 Encounter for antineoplastic chemotherapy: Secondary | ICD-10-CM | POA: Diagnosis not present

## 2021-11-01 DIAGNOSIS — Z800 Family history of malignant neoplasm of digestive organs: Secondary | ICD-10-CM | POA: Diagnosis not present

## 2021-11-01 DIAGNOSIS — D696 Thrombocytopenia, unspecified: Secondary | ICD-10-CM | POA: Diagnosis not present

## 2021-11-01 DIAGNOSIS — Z90710 Acquired absence of both cervix and uterus: Secondary | ICD-10-CM | POA: Diagnosis not present

## 2021-11-01 DIAGNOSIS — C712 Malignant neoplasm of temporal lobe: Secondary | ICD-10-CM | POA: Diagnosis not present

## 2021-11-01 DIAGNOSIS — Z801 Family history of malignant neoplasm of trachea, bronchus and lung: Secondary | ICD-10-CM | POA: Diagnosis not present

## 2021-11-01 LAB — CBC WITH DIFFERENTIAL (CANCER CENTER ONLY)
Abs Immature Granulocytes: 0.01 10*3/uL (ref 0.00–0.07)
Basophils Absolute: 0 10*3/uL (ref 0.0–0.1)
Basophils Relative: 0 %
Eosinophils Absolute: 0 10*3/uL (ref 0.0–0.5)
Eosinophils Relative: 1 %
HCT: 34.1 % — ABNORMAL LOW (ref 36.0–46.0)
Hemoglobin: 11.8 g/dL — ABNORMAL LOW (ref 12.0–15.0)
Immature Granulocytes: 0 %
Lymphocytes Relative: 38 %
Lymphs Abs: 1.1 10*3/uL (ref 0.7–4.0)
MCH: 32.1 pg (ref 26.0–34.0)
MCHC: 34.6 g/dL (ref 30.0–36.0)
MCV: 92.7 fL (ref 80.0–100.0)
Monocytes Absolute: 0.3 10*3/uL (ref 0.1–1.0)
Monocytes Relative: 10 %
Neutro Abs: 1.5 10*3/uL — ABNORMAL LOW (ref 1.7–7.7)
Neutrophils Relative %: 51 %
Platelet Count: 267 10*3/uL (ref 150–400)
RBC: 3.68 MIL/uL — ABNORMAL LOW (ref 3.87–5.11)
RDW: 14.7 % (ref 11.5–15.5)
WBC Count: 2.9 10*3/uL — ABNORMAL LOW (ref 4.0–10.5)
nRBC: 0 % (ref 0.0–0.2)

## 2021-11-01 LAB — CMP (CANCER CENTER ONLY)
ALT: 16 U/L (ref 0–44)
AST: 15 U/L (ref 15–41)
Albumin: 4.6 g/dL (ref 3.5–5.0)
Alkaline Phosphatase: 131 U/L — ABNORMAL HIGH (ref 38–126)
Anion gap: 8 (ref 5–15)
BUN: 10 mg/dL (ref 6–20)
CO2: 26 mmol/L (ref 22–32)
Calcium: 10.3 mg/dL (ref 8.9–10.3)
Chloride: 105 mmol/L (ref 98–111)
Creatinine: 0.94 mg/dL (ref 0.44–1.00)
GFR, Estimated: >60 mL/min (ref >60)
Glucose, Bld: 103 mg/dL — ABNORMAL HIGH (ref 70–99)
Potassium: 3.9 mmol/L (ref 3.5–5.1)
Sodium: 139 mmol/L (ref 135–145)
Total Bilirubin: 0.6 mg/dL (ref 0.3–1.2)
Total Protein: 7.6 g/dL (ref 6.5–8.1)

## 2021-11-01 LAB — TOTAL PROTEIN, URINE DIPSTICK: Protein, ur: 30 mg/dL — AB

## 2021-11-01 MED ORDER — SODIUM CHLORIDE 0.9 % IV SOLN: Freq: Once | INTRAVENOUS | Status: AC

## 2021-11-01 MED ORDER — SODIUM CHLORIDE 0.9 % IV SOLN
10.0000 mg/kg | Freq: Once | INTRAVENOUS | Status: AC
  Administered 2021-11-01: 800 mg via INTRAVENOUS
  Filled 2021-11-01: qty 32

## 2021-11-01 MED ORDER — LOMUSTINE 100 MG PO CAPS: 100.0000 mg | ORAL_CAPSULE | Freq: Once | ORAL | 0 refills | Status: AC

## 2021-11-01 MED ORDER — LOMUSTINE 40 MG PO CAPS: 80.0000 mg | ORAL_CAPSULE | Freq: Once | ORAL | 0 refills | Status: AC

## 2021-11-01 NOTE — Progress Notes (Signed)
[facility] [place], [place] [address] [phone]   Interval Evaluation  Date of Service: 11/01/21 Patient Name: [name] Patient MRN: [id number] Patient DOB: [birth year] Provider: Ventura Sellers, MD  Identifying Statement:  [name] is a 56 y.o. female with left temporal glioblastoma   Oncologic History: Oncology History  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[place])  11/21/2020 Initial Diagnosis   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[place])   11/21/2020 Cancer Staging   Staging form: Brain and Spinal Cord, AJCC 8th Edition - Pathologic stage from 11/21/2020: WHO Grade IV - Signed by [name] Sellers, MD on 11/30/2020 Histopathologic type: Glioblastoma Stage prefix: Initial diagnosis Histologic grading system: 4 grade system Extent of surgical resection: Subtotal resection Karnofsky performance status: Score 90 Seizures at presentation: Present Duration of symptoms before diagnosis: Short IDH1 mutation: Negative   12/27/2020 - 02/07/2021 Radiation Therapy   IMRT and concurrent Temodar Isidore Moos)   03/12/2021 - 08/26/2021 Chemotherapy   Completes 5 cycles of adjuvant 5-day Temodar   08/27/2021 Progression   POD #1   09/06/2021 -  Chemotherapy   Initiates second line therapy; CCNU 26m/m2 PO q6 weeks and avastin 129mkg IV q2 weeks     Biomarkers:  MGMT Methylated.  IDH 1/2 Wild type.  EGFR Unknown  TERT Unknown   Interval History: [name] STELZERresents today for avastin infusion. Today she describes worsening bilateral shoulder pain, although this symptom predates her cancer diagnosis.  She also complains of increased blurry vision in both eyes, roughly equal.  Obtained new prescription but this has only helped modestly.  Denies seizures or headaches.  Short term memory impairment and fatigue are still persistent.       H+P (11/25/20) Patient presented to  medical attention this past month with first ever seizure.  She describes episode of sudden onset "disconnection" with tongue biting and urination, followed by period of confusion.  CNS imaging demonstrated enhancing mass within left anterior temporal lobe, which was mostly resected by Dr. [name] 11/21/20.  Since surgery she had not had recurrence of seizures.  She complains of painful cramping of her right leg, which started ~1 week prior.  At this time the cramping is her main complaint.  Otherwise fully functional, independent, would like to return to work if possible.  Medications: Current Outpatient Medications on File Prior to Visit  Medication Sig Dispense Refill   amitriptyline ([name]) 50 MG tablet TAKE 1 TABLET BY MOUTH EVERYDAY AT BEDTIME 90 tablet 2   amLODipine (NORVASC) 10 MG tablet Take 1 tablet (10 mg total) by mouth daily. 30 tablet 2   baclofen 5 MG TABS Take 5 mg by mouth 3 (three) times daily as needed for muscle spasms. 60 tablet 1   CVS D3 125 MCG (5000 UT) capsule TAKE 1 CAPSULE (5,000 UNITS TOTAL) BY MOUTH DAILY. 90 capsule 3   lacosamide (VIMPAT) 50 MG TABS tablet Take 1 tablet (50 mg total) by mouth 2 (two) times daily. 60 tablet 3   levETIRAcetam (KEPPRA) 750 MG tablet Take 2 tablets (1,500 mg total) by mouth 2 (two) times daily. 360 tablet 1   levothyroxine (SYNTHROID) 100 MCG tablet TAKE 1 TABLET BY MOUTH EVERY DAY 90 tablet 3   LORazepam (ATIVAN) 2 MG tablet Take 1 tablet (2 mg total) by mouth every 8 (eight) hours as needed for seizure. 12 tablet 0   ondansetron (ZOFRAN) 8 MG tablet Take 1 tablet (  8 mg total) by mouth 2 (two) times daily as needed. Start on the third day after chemotherapy. 30 tablet 1   Potassium Chloride ER 20 MEQ TBCR TAKE 1 TABLET BY MOUTH EVERY DAY 90 tablet 3   No current facility-administered medications on file prior to visit.    Allergies: No Known Allergies Past Medical History:  Past Medical History:  Diagnosis Date   Bell's palsy     left side of face- notied in smile and eyes, if very tired   Brain cancer ([name])    Complication of anesthesia    N/V   Essential hypertension    External hemorrhoids    History of blood transfusion    post op Hemorroids   History of pneumonia    Hyperlipidemia    Hyperthyroidism    hx 56 years old- oral meds   Iron deficiency anemia    Migraine headache    Pneumonia    x 2 - years ago   PONV (postoperative nausea and vomiting)    PPD positive    Pulmonary nodules    not seen on plain cxr, first detected 1/07 re ct 10/08 pos IPPD > 15 mm 12/09 FOB/lavage 04/20/08 neg afb smear   PVC's (premature ventricular contractions)    Seizure ([name]) 11/15/2020   Sleep apnea    refused CPAP- mouth piece recommended   Tuberculosis    in her late 30's   Past Surgical History:  Past Surgical History:  Procedure Laterality Date   ABDOMINAL HYSTERECTOMY     APPLICATION OF CRANIAL NAVIGATION N/A 11/21/2020   Procedure: APPLICATION OF CRANIAL NAVIGATION;  Surgeon: Ostergard, Thomas A, MD;  Location[facility];  Service: Neurosurgery;  Laterality: N/A;   BREAST BIOPSY Right 2014   BREAST BIOPSY Left 10/29/2019   CHOLECYSTECTOMY  2014   COLONOSCOPY  05/04/2009   prolapsing external hemorrhoids   CRANIOTOMY Left 11/21/2020   Procedure: Left craniotomy for tumor resection;  Surgeon: Ostergard, Thomas A, MD;  Location: [facility];  Service: Neurosurgery;  Laterality: Left;   ENDOMETRIAL ABLATION     HEMORRHOID SURGERY     x 3   TUBAL LIGATION     Social History:  Social History   Socioeconomic History   Marital status: Divorced    Spouse name: Not on file   Number of children: 2   Years of education: Not on file   Highest education level: Associate degree: occupational, technical, or vocational program  Occupational History   Occupation: accounts billing    Employer: [employer]  Tobacco Use   Smoking status: Never   Smokeless tobacco: Never  Vaping Use   Vaping Use: Never used  Substance  and Sexual Activity   Alcohol use: No    Alcohol/week: 0.0 standard drinks of alcohol   Drug use: No   Sexual activity: Not on file  Other Topics Concern   Not on file  Social History Narrative   Divorced with 2 children   She works as a supervisor in accounting    Possible TB exposure 2008 (after nodules found)   From [place] lived there and [place]   Never owned birds      Social Determinants of Health   Financial Resource Strain: Medium Risk (06/13/2021)   Overall Financial Resource Strain (CARDIA)    Difficulty of Paying Living Expenses: Somewhat hard  Food Insecurity: Food Insecurity Present (04/23/2021)   Hunger Vital Sign    Worried About Running Out of Food in the Last Year:   Sometimes true    Ran Out of Food in the Last Year: Never true  Transportation Needs: No Transportation Needs (06/13/2021)   PRAPARE - Transportation    Lack of Transportation (Medical): No    Lack of Transportation (Non-Medical): No  Physical Activity: Unknown (04/23/2021)   Exercise Vital Sign    Days of Exercise per Week: 0 days    Minutes of Exercise per Session: Not on file  Stress: No Stress Concern Present (04/23/2021)   [employer] - Occupational Stress Questionnaire    Feeling of Stress : Only a little  Social Connections: Moderately Integrated (04/23/2021)   Social Connection and Isolation Panel [NHANES]    Frequency of Communication with Friends and Family: Twice a week    Frequency of Social Gatherings with Friends and Family: Twice a week    Attends Religious Services: More than 4 times per year    Active Member of Clubs or Organizations: Yes    Attends Club or Organization Meetings: More than 4 times per year    Marital Status: Divorced  Intimate Partner Violence: Not on file   Family History:  Family History  Problem Relation Age of Onset   Diabetes type II Mother    Kidney failure Sister    Diabetes Mellitus II Sister    Colon cancer Maternal Aunt         aunts and uncles   Diabetes Mellitus II Maternal Aunt    Pancreatic cancer Maternal Aunt    Colon cancer Maternal Uncle    Diabetes Mellitus II Maternal Uncle    Heart disease Maternal Grandmother        aunts, uncles, sister   Diabetes Mellitus II Maternal Grandmother    Lung cancer Other        aunts and uncles    Review of Systems: Constitutional: poor appetite Eyes: blurriness of vision Ears, nose, mouth, throat, and face: Doesn't report sore throat Respiratory: Doesn't report cough, dyspnea or wheezes Cardiovascular: Doesn't report palpitation, chest discomfort  Gastrointestinal:  Doesn't report nausea, constipation, diarrhea GU: Doesn't report incontinence Skin: Doesn't report skin rashes Neurological: Per HPI Musculoskeletal: bilateral shoulder pain Behavioral/Psych: +anxiety  Physical Exam:    11/01/2021    9:35 AM 10/25/2021    9:09 AM 10/18/2021    9:31 AM  Vitals with BMI  Height 5' 8" 5' 8" 5' 8"  Weight 173 lbs 14 oz 171 lbs 175 lbs  BMI 26.45 26.01 26.61  Systolic 141 122 146  Diastolic 94 82 94  Pulse 82 100 85    KPS: 90. General: Alert, cooperative, pleasant, in no acute distress Head: Normal EENT: No conjunctival injection or scleral icterus.  Lungs: Resp effort normal Cardiac: Regular rate Abdomen: Non-distended abdomen Skin: No rashes cyanosis or petechiae. Extremities: No clubbing or edema  Neurologic Exam: Mental Status: Awake, alert, attentive to examiner. Oriented to self and environment. Language is fluent with intact comprehension.  Cranial Nerves: Visual acuity is grossly normal. Visual fields are full. Extra-ocular movements intact. No ptosis. Face L LMN paresis, chronic. Motor: Tone and bulk are normal. Power is full in both arms and legs. Reflexes are symmetric, no pathologic reflexes present.  Sensory: Intact to light touch Gait: Normal.   Labs: I have reviewed the data as listed    Component Value Date/Time   NA 141 10/18/2021  0905   K 3.7 10/18/2021 0905   CL 107 10/18/2021 0905   CO2 26 10/18/2021 0905   GLUCOSE 97   10/18/2021 0905   BUN 10 10/18/2021 0905   CREATININE 0.88 10/18/2021 0905   CREATININE 0.92 01/25/2020 1424   CALCIUM 10.0 10/18/2021 0905   PROT 7.0 10/18/2021 0905   ALBUMIN 4.4 10/18/2021 0905   AST 17 10/18/2021 0905   ALT 22 10/18/2021 0905   ALKPHOS 123 10/18/2021 0905   BILITOT 1.3 (H) 10/18/2021 0905   GFRNONAA >60 10/18/2021 0905   GFRNONAA 72 01/25/2020 1424   GFRAA 83 01/25/2020 1424   Lab Results  Component Value Date   WBC 2.9 (L) 11/01/2021   NEUTROABS PENDING 11/01/2021   HGB 11.8 (L) 11/01/2021   HCT 34.1 (L) 11/01/2021   MCV 92.7 11/01/2021   PLT 267 11/01/2021     Assessment/Plan 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[place]) [C71.9]  Seleta TOSHIYE KEVER is clinically stable today, now having completed cycle #1 CCNU/avastin.  Last visit labs demonstrated thrombocytopenia, neutropenia, xarxio was administered instead of chemo/avastin.  Counts are improved today but still low (ANC).  Patient will continue treatment with cycle #2 oral CCNU 67m/m2 q6 weeks and avastin 149mkg IV 2q weeks.  Avastin will be helpful as concurrent therapy given burden of enhancing tumor and steroid requirement.  We reviewed side effects of CCNU, including fatigue, nausea vomiting, cytopenias, ILD.  We reviewed side effects of avastin, including hypertension, bleeding/clotting events, wound healing impairment.  The patient will have a complete blood count, a comprehensive metabolic panel, and urine protein performed prior to each avastin infusion. Labs may need to be performed more often. Zofran will prescribed for home use for nausea/vomiting.   Chemotherapy should be held for the following:  ANC less than 1,000  Platelets less than 100,000  LFT or creatinine greater than 2x ULN  If clinical concerns/contraindications develop  Avastin should be held for the following:   ANC less than 500  Platelets less than 50,000  LFT or creatinine greater than 2x ULN  If clinical concerns/contraindications develop  Next cycle of CCNU should be administered on 11/13/21.  We will follow up with xarxio injection and next avastin on 7/13.  For seizures, will con't Vimpat 5067mID and Keppra 1500m39mD.  Will con't baclofen 5mg 52m PRN.  She may dose Tylenol or Tramadol PRN for joint pain.  We ask that TijuaMAEVIS MUMBYrn to clinic in 2 weeks for avastin, xarxio.    All questions were answered. The patient knows to call the clinic with any problems, questions or concerns. No barriers to learning were detected.  The total time spent in the encounter was 30 minutes and more than 50% was on counseling and review of test results   ZachaVentura SellersMedical Director of Neuro-Oncology [facility] CenteresleCuldesac9/23 9:54 AM

## 2021-11-01 NOTE — Progress Notes (Signed)
Patient was identified to be at risk for malnutrition secondary to wt loss and poor appetite as noted on MST.  56 year old female diagnosed with Glioblastoma and followed by Dr. Mickeal Skinner.  PMH includes HLD, Hyperthyroidism, IDA, Migraines, Seizures, TB  Medications include Synthroid, Ativan and Zofran.  Labs include Glucose of 103 on June 29.  Height: 68 inches Weight: 173 # 14.4 oz today increased from 171 pounds June 22. Patient weighed 192 pounds January 2023. BMI: 26.44  Patient reports she forces herself to eat and that is why she gained a few pounds. She declines oral nutrition supplements. She denies nutrition impact symptoms.  Nutrition Diagnosis: Unintended wt loss related to cancer and associated treatments as evidenced by 10% wt loss over 6 months which is significant  Intervention: Educated on importance of small, frequent meals and snacks. Reviewed ways to add calories and protein. Encouraged wt maintenance. Provided nutrition fact sheet.  Monitoring, Evaluation, Goals: Increase calories and protein to minimize weight loss  No follow up scheduled. Please refer back to nutrition as needed.

## 2021-11-01 NOTE — Progress Notes (Signed)
[name], am serving as a Education administrator for Dr. Lynne Leader. Subjective:    CC: L shoulder pain  HPI: Pt is a 56 y/o female presenting w/ c/o L shoulder pain x approximately one month w/ no known MOI.  She locates her pain to Lateral left shoulder. Patient has bilateral shoulder pain but is worse in the left. Left shoulder has limited ROM and cannot sleep on that side. Patient declines any neck pain   Radiating pain: sometimes to the top of the shoulder  L shoulder mechanical symptoms: no Aggravating factors: Lifting or twisting arm certain ways  Treatments tried: Tramadol; sling;   Diagnostic testing: L shoulder XR- 10/25/21  Pertinent review of Systems: No fevers or chills  Relevant historical information: Positive for glioblastoma diagnosed about a year ago.  She has received radiation treatment now currently receiving chemotherapy with Lomustine   Objective:    Vitals:   11/02/21 0937  BP: 130/86  Pulse: 66  SpO2: 99%   General: Well Developed, well nourished, and in no acute distress.   MSK: Left shoulder: Normal appearing Significantly decreased range of motion.  Abduction 90 degrees external rotation 20 degrees via neutral position internal rotation posterior iliac crest. Strength intact within limits of range of motion. Inadequate positioning for Hawkins or Neer's test. Pulses capillary fill and sensation are intact distally.  Lab and Radiology Results  Procedure: Real-time Ultrasound Guided Injection of left shoulder glenohumeral joint posterior approach Device: Philips Affiniti 50G Images permanently stored and available for review in PACS Ultrasound evaluation prior to injection reveals intact rotator cuff tendons with minimal subacromial bursitis. Verbal informed consent obtained.  Discussed risks and benefits of procedure. Warned about infection, bleeding, hyperglycemia damage to structures among others. Patient expresses understanding and  agreement Time-out conducted.   Noted no overlying erythema, induration, or other signs of local infection.   Skin prepped in a sterile fashion.   Local anesthesia: Topical Ethyl chloride.   With sterile technique and under real time ultrasound guidance: 40 mg of Kenalog and 2 mL of Marcaine injected into subacromial bursa. Fluid seen entering the bursa.   Completed without difficulty   Pain immediately resolved suggesting accurate placement of the medication.   Advised to call if fevers/chills, erythema, induration, drainage, or persistent bleeding.   Images permanently stored and available for review in the ultrasound unit.  Impression: Technically successful ultrasound guided injection.   EXAM: LEFT SHOULDER - 2+ VIEW   COMPARISON:  None Available.   FINDINGS: There is no evidence of fracture or dislocation. There is no evidence of arthropathy or other focal bone abnormality. Soft tissues are unremarkable.   IMPRESSION: Negative.     Electronically Signed   By: [name] M.D.   On: 10/25/2021 20:17  I, Lynne Leader, personally (independently) visualized and performed the interpretation of the images attached in this note.     Impression and Recommendations:    Assessment and Plan: 56 y.o. female with left shoulder pain.  Pain predominantly thought to be due to adhesive capsulitis.  She could have some rotator cuff tendinitis as a component of her pain but adhesive capsulitis is very likely the main source.  She had a pretty good response to pain immediately following injection which indicates this is the primary pain generator.  Ideally I would like to refer her to physical therapy but she is so busy with her cancer treatment that I am worried about adding extra healthcare burden.  Plan for a little  bit of range of motion and steroid injection and checking back as needed.  I will make sure to inform her treating oncologist Dr. Mickeal Skinner that I performed a steroid injection  today so that he is aware in case her labs are abnormal in the near future.  We spent some time talking about the expected side effects of steroid injection today.  I think it will be okay with her current regimen but there is some mild risk today with an injection.  I think overall the benefit of the injection well with the risk.  PDMP not reviewed this encounter. Orders Placed This Encounter  Procedures   [place] LIMITED JOINT SPACE STRUCTURES UP LEFT(NO LINKED CHARGES)    Order Specific Question:   Reason for Exam (SYMPTOM  OR DIAGNOSIS REQUIRED)    Answer:   L shoulder pain    Order Specific Question:   Preferred imaging location?    Answer:   [facility]   No orders of the defined types were placed in this encounter.   Discussed warning signs or symptoms. Please see discharge instructions. Patient expresses understanding.   The above documentation has been reviewed and is accurate and complete Lynne Leader, M.D.

## 2021-11-01 NOTE — Patient Instructions (Signed)
[facility]COL[facility]  Discharge Instructions: Thank you for choosing [facility] to provide your oncology and hematology care.   If you have a lab appointment with the [facility], please go directly to the [facility] and check in at the registration area.   Wear comfortable clothing and clothing appropriate for easy access to any Portacath or PICC line.   We strive to give you quality time with your provider. You may need to reschedule your appointment if you arrive late (15 or more minutes).  Arriving late affects you and other patients whose appointments are after yours.  Also, if you miss three or more appointments without notifying the office, you may be dismissed from the clinic at the provider's discretion.      For prescription refill requests, have your pharmacy contact our office and allow 72 hours for refills to be completed.    Today you received the following chemotherapy and/or immunotherapy agents: [name]     To help prevent nausea and vomiting after your treatment, we encourage you to take your nausea medication as directed.  BELOW ARE SYMPTOMS THAT SHOULD BE REPORTED IMMEDIATELY: *FEVER GREATER THAN 100.4 F (38 C) OR HIGHER *CHILLS OR SWEATING *NAUSEA AND VOMITING THAT IS NOT CONTROLLED WITH YOUR NAUSEA MEDICATION *UNUSUAL SHORTNESS OF BREATH *UNUSUAL BRUISING OR BLEEDING *URINARY PROBLEMS (pain or burning when urinating, or frequent urination) *BOWEL PROBLEMS (unusual diarrhea, constipation, pain near the anus) TENDERNESS IN MOUTH AND THROAT WITH OR WITHOUT PRESENCE OF ULCERS (sore throat, sores in mouth, or a toothache) UNUSUAL RASH, SWELLING OR PAIN  UNUSUAL VAGINAL DISCHARGE OR ITCHING   Items with * indicate a potential emergency and should be followed up as soon as possible or go to the Emergency Department if any problems should occur.  Please show the CHEMOTHERAPY ALERT CARD or IMMUNOTHERAPY ALERT CARD at check-in to the  Emergency Department and triage nurse.  Should you have questions after your visit or need to cancel or reschedule your appointment, please contact [facility]  Dept: [phone]  and follow the prompts.  Office hours are 8:00 a.m. to 4:30 p.m. Monday - Friday. Please note that voicemails left after 4:00 p.m. may not be returned until the following business day.  We are closed weekends and major holidays. You have access to a nurse at all times for urgent questions. Please call the main number to the clinic Dept: [phone] and follow the prompts.   For any non-urgent questions, you may also contact your provider using MyChart. We now offer e-Visits for anyone 81 and older to request care online for non-urgent symptoms. For details visit [email or web address].   Also download the MyChart app! Go to the app store, search "MyChart", open the app, select [facility], and log in with your MyChart username and password.  Masks are optional in the cancer centers. If you would like for your care team to wear a mask while they are taking care of you, please let them know. For doctor visits, patients may have with them one support person who is at least 54 years old. At this time, visitors are not allowed in the infusion area.

## 2021-11-02 ENCOUNTER — Ambulatory Visit: Payer: Self-pay

## 2021-11-02 ENCOUNTER — Ambulatory Visit (INDEPENDENT_AMBULATORY_CARE_PROVIDER_SITE_OTHER): Payer: BC Managed Care – PPO | Admitting: Family Medicine

## 2021-11-02 ENCOUNTER — Telehealth: Payer: Self-pay | Admitting: Internal Medicine

## 2021-11-02 VITALS — BP 130/86 | HR 66 | Ht 68.0 in | Wt 172.0 lb

## 2021-11-02 DIAGNOSIS — M25512 Pain in left shoulder: Secondary | ICD-10-CM

## 2021-11-02 NOTE — Patient Instructions (Addendum)
Good to see you Injection in left shoulder today  Call or go to the ER if you develop a large red swollen joint with extreme pain or oozing puss.   Recheck with me as needed.

## 2021-11-02 NOTE — Telephone Encounter (Signed)
Per 6/29 los called and left message for pt about appointment.Marland Kitchen  details and call back number left

## 2021-11-13 ENCOUNTER — Telehealth: Payer: Self-pay | Admitting: *Deleted

## 2021-11-13 ENCOUNTER — Telehealth: Payer: Self-pay | Admitting: Internal Medicine

## 2021-11-13 NOTE — Telephone Encounter (Signed)
Received call from [facility] to advise that she has access to 24 hour nurse line and additional resources.  She can call them to speak with her Case Manager with them.    [phone] ext [phone] Albina Billet (Case Manager).

## 2021-11-13 NOTE — Telephone Encounter (Signed)
Per 7/11 phone line pt called to reschedule.  Appointment time picked by pt

## 2021-11-15 ENCOUNTER — Inpatient Hospital Stay: Payer: BC Managed Care – PPO | Attending: Internal Medicine

## 2021-11-15 ENCOUNTER — Other Ambulatory Visit: Payer: BC Managed Care – PPO

## 2021-11-15 ENCOUNTER — Other Ambulatory Visit: Payer: Self-pay

## 2021-11-15 ENCOUNTER — Inpatient Hospital Stay: Payer: BC Managed Care – PPO

## 2021-11-15 ENCOUNTER — Inpatient Hospital Stay (HOSPITAL_BASED_OUTPATIENT_CLINIC_OR_DEPARTMENT_OTHER): Payer: BC Managed Care – PPO | Admitting: Internal Medicine

## 2021-11-15 ENCOUNTER — Ambulatory Visit: Payer: BC Managed Care – PPO

## 2021-11-15 ENCOUNTER — Ambulatory Visit: Payer: BC Managed Care – PPO | Admitting: Internal Medicine

## 2021-11-15 VITALS — BP 140/97 | HR 70 | Resp 18

## 2021-11-15 VITALS — BP 138/92 | HR 79 | Temp 98.1°F | Resp 17 | Ht 68.0 in | Wt 171.7 lb

## 2021-11-15 DIAGNOSIS — C719 Malignant neoplasm of brain, unspecified: Secondary | ICD-10-CM | POA: Diagnosis not present

## 2021-11-15 DIAGNOSIS — R569 Unspecified convulsions: Secondary | ICD-10-CM | POA: Diagnosis not present

## 2021-11-15 DIAGNOSIS — Z5111 Encounter for antineoplastic chemotherapy: Secondary | ICD-10-CM | POA: Diagnosis not present

## 2021-11-15 DIAGNOSIS — C712 Malignant neoplasm of temporal lobe: Secondary | ICD-10-CM | POA: Diagnosis not present

## 2021-11-15 DIAGNOSIS — Z5189 Encounter for other specified aftercare: Secondary | ICD-10-CM | POA: Insufficient documentation

## 2021-11-15 DIAGNOSIS — T451X5A Adverse effect of antineoplastic and immunosuppressive drugs, initial encounter: Secondary | ICD-10-CM

## 2021-11-15 LAB — CMP (CANCER CENTER ONLY)
ALT: 15 U/L (ref 0–44)
AST: 15 U/L (ref 15–41)
Albumin: 4.6 g/dL (ref 3.5–5.0)
Alkaline Phosphatase: 136 U/L — ABNORMAL HIGH (ref 38–126)
Anion gap: 7 (ref 5–15)
BUN: 11 mg/dL (ref 6–20)
CO2: 27 mmol/L (ref 22–32)
Calcium: 9.9 mg/dL (ref 8.9–10.3)
Chloride: 106 mmol/L (ref 98–111)
Creatinine: 0.94 mg/dL (ref 0.44–1.00)
GFR, Estimated: >60 mL/min (ref >60)
Glucose, Bld: 97 mg/dL (ref 70–99)
Potassium: 3.6 mmol/L (ref 3.5–5.1)
Sodium: 140 mmol/L (ref 135–145)
Total Bilirubin: 1 mg/dL (ref 0.3–1.2)
Total Protein: 7.5 g/dL (ref 6.5–8.1)

## 2021-11-15 LAB — CBC WITH DIFFERENTIAL (CANCER CENTER ONLY)
Abs Immature Granulocytes: 0 10*3/uL (ref 0.00–0.07)
Basophils Absolute: 0 10*3/uL (ref 0.0–0.1)
Basophils Relative: 1 %
Eosinophils Absolute: 0.1 10*3/uL (ref 0.0–0.5)
Eosinophils Relative: 2 %
HCT: 39.1 % (ref 36.0–46.0)
Hemoglobin: 13.4 g/dL (ref 12.0–15.0)
Immature Granulocytes: 0 %
Lymphocytes Relative: 23 %
Lymphs Abs: 0.9 10*3/uL (ref 0.7–4.0)
MCH: 32.2 pg (ref 26.0–34.0)
MCHC: 34.3 g/dL (ref 30.0–36.0)
MCV: 94 fL (ref 80.0–100.0)
Monocytes Absolute: 0.4 10*3/uL (ref 0.1–1.0)
Monocytes Relative: 9 %
Neutro Abs: 2.6 10*3/uL (ref 1.7–7.7)
Neutrophils Relative %: 65 %
Platelet Count: 211 10*3/uL (ref 150–400)
RBC: 4.16 MIL/uL (ref 3.87–5.11)
RDW: 15.4 % (ref 11.5–15.5)
WBC Count: 4.1 10*3/uL (ref 4.0–10.5)
nRBC: 0 % (ref 0.0–0.2)

## 2021-11-15 LAB — TOTAL PROTEIN, URINE DIPSTICK: Protein, ur: <30 mg/dL — AB

## 2021-11-15 MED ORDER — FILGRASTIM-SNDZ 300 MCG/0.5ML IJ SOSY
300.0000 ug | PREFILLED_SYRINGE | Freq: Once | INTRAMUSCULAR | Status: AC
  Administered 2021-11-15: 300 ug via SUBCUTANEOUS
  Filled 2021-11-15: qty 0.5

## 2021-11-15 MED ORDER — SODIUM CHLORIDE 0.9 % IV SOLN: Freq: Once | INTRAVENOUS | Status: AC

## 2021-11-15 MED ORDER — SODIUM CHLORIDE 0.9 % IV SOLN
10.0000 mg/kg | Freq: Once | INTRAVENOUS | Status: AC
  Administered 2021-11-15: 800 mg via INTRAVENOUS
  Filled 2021-11-15: qty 32

## 2021-11-15 NOTE — Patient Instructions (Signed)
[facility] ONCOLOGY   Discharge Instructions: Thank you for choosing [facility] to provide your oncology and hematology care.   If you have a lab appointment with the [facility], please go directly to the [facility] and check in at the registration area.   Wear comfortable clothing and clothing appropriate for easy access to any Portacath or PICC line.   We strive to give you quality time with your provider. You may need to reschedule your appointment if you arrive late (15 or more minutes).  Arriving late affects you and other patients whose appointments are after yours.  Also, if you miss three or more appointments without notifying the office, you may be dismissed from the clinic at the provider's discretion.      For prescription refill requests, have your pharmacy contact our office and allow 72 hours for refills to be completed.    Today you received the following chemotherapy and/or immunotherapy agents: Bevacizumab (Avastin)       To help prevent nausea and vomiting after your treatment, we encourage you to take your nausea medication as directed.  BELOW ARE SYMPTOMS THAT SHOULD BE REPORTED IMMEDIATELY: *FEVER GREATER THAN 100.4 F (38 C) OR HIGHER *CHILLS OR SWEATING *NAUSEA AND VOMITING THAT IS NOT CONTROLLED WITH YOUR NAUSEA MEDICATION *UNUSUAL SHORTNESS OF BREATH *UNUSUAL BRUISING OR BLEEDING *URINARY PROBLEMS (pain or burning when urinating, or frequent urination) *BOWEL PROBLEMS (unusual diarrhea, constipation, pain near the anus) TENDERNESS IN MOUTH AND THROAT WITH OR WITHOUT PRESENCE OF ULCERS (sore throat, sores in mouth, or a toothache) UNUSUAL RASH, SWELLING OR PAIN  UNUSUAL VAGINAL DISCHARGE OR ITCHING   Items with * indicate a potential emergency and should be followed up as soon as possible or go to the Emergency Department if any problems should occur.  Please show the CHEMOTHERAPY ALERT CARD or IMMUNOTHERAPY ALERT CARD  at check-in to the Emergency Department and triage nurse.  Should you have questions after your visit or need to cancel or reschedule your appointment, please contact [facility]  Dept: [phone]  and follow the prompts.  Office hours are 8:00 a.m. to 4:30 p.m. Monday - Friday. Please note that voicemails left after 4:00 p.m. may not be returned until the following business day.  We are closed weekends and major holidays. You have access to a nurse at all times for urgent questions. Please call the main number to the clinic Dept: [phone] and follow the prompts.   For any non-urgent questions, you may also contact your provider using MyChart. We now offer e-Visits for anyone 45 and older to request care online for non-urgent symptoms. For details visit [email or web address].   Also download the MyChart app! Go to the app store, search "MyChart", open the app, select [facility], and log in with your MyChart username and password.  Masks are optional in the cancer centers. If you would like for your care team to wear a mask while they are taking care of you, please let them know. For doctor visits, patients may have with them one support person who is at least 54 years old. At this time, visitors are not allowed in the infusion area.

## 2021-11-15 NOTE — Progress Notes (Signed)
Patient's blood pressure remained elevated upon d/c. Patient denies any chest pain, headache, shortness of breath, or blurred vision at this time. Patient in no acute distress at discharge. Patient will continue to assess BP at home and take BP medication as prescribed.

## 2021-11-15 NOTE — Progress Notes (Signed)
[facility] [place], [place] [address] [phone]   Interval Evaluation  Date of Service: 11/15/21 Patient Name: [name] Patient MRN: [id number] Patient DOB: 1966-05-04 Provider: Ventura Sellers, MD  Identifying Statement:  [name] is a 56 y.o. female with left temporal glioblastoma   Oncologic History: Oncology History  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[facility])  11/21/2020 Initial Diagnosis   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[facility])   11/21/2020 Cancer Staging   Staging form: Brain and Spinal Cord, AJCC 8th Edition - Pathologic stage from 11/21/2020: WHO Grade IV - Signed by Ventura Sellers, MD on 11/30/2020 Histopathologic type: Glioblastoma Stage prefix: Initial diagnosis Histologic grading system: 4 grade system Extent of surgical resection: Subtotal resection Karnofsky performance status: Score 90 Seizures at presentation: Present Duration of symptoms before diagnosis: Short IDH1 mutation: Negative   12/27/2020 - 02/07/2021 Radiation Therapy   IMRT and concurrent Temodar Isidore Moos)   03/12/2021 - 08/26/2021 Chemotherapy   Completes 5 cycles of adjuvant 5-day Temodar   08/27/2021 Progression   POD #1   09/06/2021 -  Chemotherapy   Initiates second line therapy; CCNU 85m/m2 PO q6 weeks and avastin 156mkg IV q2 weeks     Biomarkers:  MGMT Methylated.  IDH 1/2 Wild type.  EGFR Unknown  TERT Unknown   Interval History: TiBEYA TIPPSresents today for avastin infusion. She dosed cycle #2 of CCNU on 11/10/10/45ithout complication.  Left shoulder steroid injection has helped her pain considerably.  Denies seizures or headaches.  Short term memory impairment and fatigue are still persistent.       H+P (11/25/20) Patient presented to medical attention this past month with first ever seizure.  She describes episode of sudden onset "disconnection" with tongue  biting and urination, followed by period of confusion.  CNS imaging demonstrated enhancing mass within left anterior temporal lobe, which was mostly resected by Dr. [name] 11/21/20.  Since surgery she had not had recurrence of seizures.  She complains of painful cramping of her right leg, which started ~1 week prior.  At this time the cramping is her main complaint.  Otherwise fully functional, independent, would like to return to work if possible.  Medications: Current Outpatient Medications on File Prior to Visit  Medication Sig Dispense Refill   amitriptyline (ELAVIL) 50 MG tablet TAKE 1 TABLET BY MOUTH EVERYDAY AT BEDTIME 90 tablet 2   amLODipine (NORVASC) 10 MG tablet Take 1 tablet (10 mg total) by mouth daily. 30 tablet 2   baclofen 5 MG TABS Take 5 mg by mouth 3 (three) times daily as needed for muscle spasms. 60 tablet 1   [employer] D3 125 MCG (5000 UT) capsule TAKE 1 CAPSULE (5,000 UNITS TOTAL) BY MOUTH DAILY. 90 capsule 3   lacosamide (VIMPAT) 50 MG TABS tablet Take 1 tablet (50 mg total) by mouth 2 (two) times daily. 60 tablet 3   levETIRAcetam (KEPPRA) 750 MG tablet Take 2 tablets (1,500 mg total) by mouth 2 (two) times daily. 360 tablet 1   levothyroxine (SYNTHROID) 100 MCG tablet TAKE 1 TABLET BY MOUTH EVERY DAY 90 tablet 3   LORazepam (ATIVAN) 2 MG tablet Take 1 tablet (2 mg total) by mouth every 8 (eight) hours as needed for seizure. 12 tablet 0   ondansetron (ZOFRAN) 8 MG tablet Take 1 tablet (8 mg total) by mouth 2 (two) times daily as needed. Start on the third day after  chemotherapy. 30 tablet 1   Potassium Chloride ER 20 MEQ TBCR TAKE 1 TABLET BY MOUTH EVERY DAY 90 tablet 3   No current facility-administered medications on file prior to visit.    Allergies: No Known Allergies Past Medical History:  Past Medical History:  Diagnosis Date   Bell's palsy    left side of face- notied in smile and eyes, if very tired   Brain cancer ([facility])    Complication of anesthesia    N/V    Essential hypertension    External hemorrhoids    History of blood transfusion    post op Hemorroids   History of pneumonia    Hyperlipidemia    Hyperthyroidism    hx 56 years old- oral meds   Iron deficiency anemia    Migraine headache    Pneumonia    x 2 - years ago   PONV (postoperative nausea and vomiting)    PPD positive    Pulmonary nodules    not seen on plain cxr, first detected 1/07 re ct 10/08 pos IPPD > 15 mm 12/09 FOB/lavage 04/20/08 neg afb smear   PVC's (premature ventricular contractions)    Seizure ([facility]) 11/15/2020   Sleep apnea    refused CPAP- mouth piece recommended   Tuberculosis    in her late 30's   Past Surgical History:  Past Surgical History:  Procedure Laterality Date   ABDOMINAL HYSTERECTOMY     APPLICATION OF CRANIAL NAVIGATION N/A 11/21/2020   Procedure: APPLICATION OF CRANIAL NAVIGATION;  Surgeon: Judith Part, MD;  Location: [facility];  Service: Neurosurgery;  Laterality: N/A;   BREAST BIOPSY Right 2014   BREAST BIOPSY Left 10/29/2019   CHOLECYSTECTOMY  2014   COLONOSCOPY  05/04/2009   prolapsing external hemorrhoids   CRANIOTOMY Left 11/21/2020   Procedure: Left craniotomy for tumor resection;  Surgeon: Judith Part, MD;  Location: [facility];  Service: Neurosurgery;  Laterality: Left;   ENDOMETRIAL ABLATION     HEMORRHOID SURGERY     x 3   TUBAL LIGATION     Social History:  Social History   Socioeconomic History   Marital status: Divorced    Spouse name: Not on file   Number of children: 2   Years of education: Not on file   Highest education level: Associate degree: occupational, Hotel manager, or vocational program  Occupational History   Occupation: accounts Sport and exercise psychologist: [employer]  Tobacco Use   Smoking status: Never   Smokeless tobacco: Never  Vaping Use   Vaping Use: Never used  Substance and Sexual Activity   Alcohol use: No    Alcohol/week: 0.0 standard drinks of alcohol   Drug use: No   Sexual activity:  Not on file  Other Topics Concern   Not on file  Social History Narrative   Divorced with 2 children   She works as a Librarian, 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Press photographer    Possible TB exposure 2008 (after nodules found)   From [place] lived there and [place]   Never owned birds      Social Determinants of Health   Financial Resource Strain: [place] (06/13/2021)   Overall Financial Resource Strain (CARDIA)    Difficulty of Paying Living Expenses: Somewhat hard  Food Insecurity: Food Insecurity Present (04/23/2021)   Hunger Vital Sign    Worried About Running Out of Food in the Last Year: Sometimes true    Ran Out of Food in the Last Year: Never true  Transportation  Needs: No Transportation Needs (06/13/2021)   PRAPARE - Hydrologist (Medical): No    Lack of Transportation (Non-Medical): No  Physical Activity: Unknown (04/23/2021)   Exercise Vital Sign    Days of Exercise per Week: 0 days    Minutes of Exercise per Session: Not on file  Stress: No Stress Concern Present (04/23/2021)   [place]    Feeling of Stress : Only a little  Social Connections: Moderately Integrated (04/23/2021)   Social Connection and Isolation Panel [NHANES]    Frequency of Communication with Friends and Family: Twice a week    Frequency of Social Gatherings with Friends and Family: Twice a week    Attends Religious Services: More than 4 times per year    Active Member of [employer] or Organizations: Yes    Attends Music therapist: More than 4 times per year    Marital Status: Divorced  Human resources officer Violence: Not on file   Family History:  Family History  Problem Relation Age of Onset   Diabetes type II Mother    Kidney failure Sister    Diabetes Mellitus II Sister    Colon cancer Maternal Aunt        aunts and uncles   Diabetes Mellitus II Maternal Aunt    Pancreatic cancer Maternal Aunt    Colon cancer Maternal Uncle     Diabetes Mellitus II Maternal Uncle    Heart disease Maternal Grandmother        aunts, uncles, sister   Diabetes Mellitus II Maternal Grandmother    Lung cancer Other        aunts and uncles    Review of Systems: Constitutional: poor appetite Eyes: blurriness of vision Ears, nose, mouth, throat, and face: Doesn't report sore throat Respiratory: Doesn't report cough, dyspnea or wheezes Cardiovascular: Doesn't report palpitation, chest discomfort  Gastrointestinal:  Doesn't report nausea, constipation, diarrhea GU: Doesn't report incontinence Skin: Doesn't report skin rashes Neurological: Per HPI Musculoskeletal: bilateral shoulder pain Behavioral/Psych: +anxiety  Physical Exam:    11/02/2021    9:37 AM 11/01/2021    9:35 AM 10/25/2021    9:09 AM  Vitals with BMI  Height '5\' 8"'  '5\' 8"'  '5\' 8"'   Weight 172 lbs 173 lbs 14 oz 171 lbs  BMI 26.16 69.48 54.62  Systolic 703 500 938  Diastolic 86 94 82  Pulse 66 82 100    KPS: 90. General: Alert, cooperative, pleasant, in no acute distress Head: Normal EENT: No conjunctival injection or scleral icterus.  Lungs: Resp effort normal Cardiac: Regular rate Abdomen: Non-distended abdomen Skin: No rashes cyanosis or petechiae. Extremities: No clubbing or edema  Neurologic Exam: Mental Status: Awake, alert, attentive to examiner. Oriented to self and environment. Language is fluent with intact comprehension.  Cranial Nerves: Visual acuity is grossly normal. Visual fields are full. Extra-ocular movements intact. No ptosis. Face L LMN paresis, chronic. Motor: Tone and bulk are normal. Power is full in both arms and legs. Reflexes are symmetric, no pathologic reflexes present.  Sensory: Intact to light touch Gait: Normal.   Labs: I have reviewed the data as listed    Component Value Date/Time   NA 139 11/01/2021 0918   K 3.9 11/01/2021 0918   CL 105 11/01/2021 0918   CO2 26 11/01/2021 0918   GLUCOSE 103 (H) 11/01/2021 0918   BUN  10 11/01/2021 0918   CREATININE 0.94 11/01/2021 1829  CREATININE 0.92 01/25/2020 1424   CALCIUM 10.3 11/01/2021 0918   PROT 7.6 11/01/2021 0918   ALBUMIN 4.6 11/01/2021 0918   AST 15 11/01/2021 0918   ALT 16 11/01/2021 0918   ALKPHOS 131 (H) 11/01/2021 0918   BILITOT 0.6 11/01/2021 0918   GFRNONAA >60 11/01/2021 0918   GFRNONAA 72 01/25/2020 1424   GFRAA 83 01/25/2020 1424   Lab Results  Component Value Date   WBC 4.1 11/15/2021   NEUTROABS 2.6 11/15/2021   HGB 13.4 11/15/2021   HCT 39.1 11/15/2021   MCV 94.0 11/15/2021   PLT 211 11/15/2021     Assessment/Plan 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) [C71.9]  Jaselyn JAYLIANNA TATLOCK is clinically stable today, now having dose cycle #2 CCNU/avastin.  Labs are within normal limits today.  Patient will continue treatment with cycle #2 oral CCNU 23m/m2 q6 weeks and avastin 114mkg IV 2q weeks.  Avastin will be helpful as concurrent therapy given burden of enhancing tumor and steroid requirement.  We reviewed side effects of CCNU, including fatigue, nausea vomiting, cytopenias, ILD.  We reviewed side effects of avastin, including hypertension, bleeding/clotting events, wound healing impairment.  The patient will have a complete blood count, a comprehensive metabolic panel, and urine protein performed prior to each avastin infusion. Labs may need to be performed more often. Zofran will prescribed for home use for nausea/vomiting.   Chemotherapy should be held for the following:  ANC less than 1,000  Platelets less than 100,000  LFT or creatinine greater than 2x ULN  If clinical concerns/contraindications develop  Avastin should be held for the following:  ANC less than 500  Platelets less than 50,000  LFT or creatinine greater than 2x ULN  If clinical concerns/contraindications develop  XaCharna Busmanill be administered today for bone marrow support.  For seizures, will con't Vimpat 5032mID and Keppra  1500m25mD.  Will con't baclofen 5mg 7m PRN.  She may dose Tylenol or Tramadol PRN for joint pain.  We ask that TijuaKAELIN HOLFORDrn to clinic in 2 weeks for avastin.  Next MRI scan will be scheduled for 12/21/21.  All questions were answered. The patient knows to call the clinic with any problems, questions or concerns. No barriers to learning were detected.  The total time spent in the encounter was 30 minutes and more than 50% was on counseling and review of test results   ZachaVentura SellersMedical Director of Neuro-Oncology [facility]le[facility] Beach3/23 8:46 AM

## 2021-11-16 ENCOUNTER — Telehealth: Payer: Self-pay | Admitting: Internal Medicine

## 2021-11-16 NOTE — Telephone Encounter (Signed)
Per 7/13 los called and spoke to pt about appointment.  Pt confirmed appointment

## 2021-11-28 NOTE — Progress Notes (Signed)
The following biosimilar Mvasi (bevacizumab-awwb) has been selected for use in this patient.  Kennith Center, Pharm.D., CPP 11/28/2021'@12'$ :46 PM

## 2021-11-29 ENCOUNTER — Inpatient Hospital Stay (HOSPITAL_BASED_OUTPATIENT_CLINIC_OR_DEPARTMENT_OTHER): Payer: BC Managed Care – PPO | Admitting: Internal Medicine

## 2021-11-29 ENCOUNTER — Inpatient Hospital Stay: Payer: BC Managed Care – PPO

## 2021-11-29 VITALS — BP 137/97 | HR 74 | Temp 97.7°F | Resp 20 | Wt 169.8 lb

## 2021-11-29 VITALS — BP 139/79 | HR 59 | Temp 97.7°F | Resp 18

## 2021-11-29 DIAGNOSIS — C719 Malignant neoplasm of brain, unspecified: Secondary | ICD-10-CM

## 2021-11-29 DIAGNOSIS — C712 Malignant neoplasm of temporal lobe: Secondary | ICD-10-CM | POA: Diagnosis not present

## 2021-11-29 DIAGNOSIS — Z5111 Encounter for antineoplastic chemotherapy: Secondary | ICD-10-CM | POA: Diagnosis not present

## 2021-11-29 DIAGNOSIS — R569 Unspecified convulsions: Secondary | ICD-10-CM

## 2021-11-29 DIAGNOSIS — Z5189 Encounter for other specified aftercare: Secondary | ICD-10-CM | POA: Diagnosis not present

## 2021-11-29 LAB — CBC WITH DIFFERENTIAL (CANCER CENTER ONLY)
Abs Immature Granulocytes: 0.01 10*3/uL (ref 0.00–0.07)
Basophils Absolute: 0 10*3/uL (ref 0.0–0.1)
Basophils Relative: 0 %
Eosinophils Absolute: 0.1 10*3/uL (ref 0.0–0.5)
Eosinophils Relative: 2 %
HCT: 38.5 % (ref 36.0–46.0)
Hemoglobin: 13.5 g/dL (ref 12.0–15.0)
Immature Granulocytes: 0 %
Lymphocytes Relative: 27 %
Lymphs Abs: 0.7 10*3/uL (ref 0.7–4.0)
MCH: 32.4 pg (ref 26.0–34.0)
MCHC: 35.1 g/dL (ref 30.0–36.0)
MCV: 92.3 fL (ref 80.0–100.0)
Monocytes Absolute: 0.3 10*3/uL (ref 0.1–1.0)
Monocytes Relative: 13 %
Neutro Abs: 1.5 10*3/uL — ABNORMAL LOW (ref 1.7–7.7)
Neutrophils Relative %: 58 %
Platelet Count: 137 10*3/uL — ABNORMAL LOW (ref 150–400)
RBC: 4.17 MIL/uL (ref 3.87–5.11)
RDW: 14.9 % (ref 11.5–15.5)
WBC Count: 2.6 10*3/uL — ABNORMAL LOW (ref 4.0–10.5)
nRBC: 0 % (ref 0.0–0.2)

## 2021-11-29 LAB — CMP (CANCER CENTER ONLY)
ALT: 15 U/L (ref 0–44)
AST: 14 U/L — ABNORMAL LOW (ref 15–41)
Albumin: 4.6 g/dL (ref 3.5–5.0)
Alkaline Phosphatase: 125 U/L (ref 38–126)
Anion gap: 8 (ref 5–15)
BUN: 14 mg/dL (ref 6–20)
CO2: 25 mmol/L (ref 22–32)
Calcium: 10 mg/dL (ref 8.9–10.3)
Chloride: 106 mmol/L (ref 98–111)
Creatinine: 0.92 mg/dL (ref 0.44–1.00)
GFR, Estimated: >60 mL/min (ref >60)
Glucose, Bld: 92 mg/dL (ref 70–99)
Potassium: 3.8 mmol/L (ref 3.5–5.1)
Sodium: 139 mmol/L (ref 135–145)
Total Bilirubin: 1.6 mg/dL — ABNORMAL HIGH (ref 0.3–1.2)
Total Protein: 7.5 g/dL (ref 6.5–8.1)

## 2021-11-29 LAB — TOTAL PROTEIN, URINE DIPSTICK: Protein, ur: <30 mg/dL — AB

## 2021-11-29 MED ORDER — SODIUM CHLORIDE 0.9 % IV SOLN: Freq: Once | INTRAVENOUS | Status: AC

## 2021-11-29 MED ORDER — SODIUM CHLORIDE 0.9 % IV SOLN
10.0000 mg/kg | Freq: Once | INTRAVENOUS | Status: AC
  Administered 2021-11-29: 800 mg via INTRAVENOUS
  Filled 2021-11-29: qty 32

## 2021-11-29 NOTE — Patient Instructions (Signed)
[facility] MEDICAL [facility]COL[facility]  Discharge Instructions: Thank you for choosing [facility] to provide your oncology and hematology care.   If you have a lab appointment with the Cancer Center, please go directly to the Cancer Center and check in at the registration area.   Wear comfortable clothing and clothing appropriate for easy access to any Portacath or PICC line.   We strive to give you quality time with your provider. You may need to reschedule your appointment if you arrive late (15 or more minutes).  Arriving late affects you and other patients whose appointments are after yours.  Also, if you miss three or more appointments without notifying the office, you may be dismissed from the clinic at the provider's discretion.      For prescription refill requests, have your pharmacy contact our office and allow 72 hours for refills to be completed.    Today you received the following chemotherapy and/or immunotherapy agents: Bevacizumab      To help prevent nausea and vomiting after your treatment, we encourage you to take your nausea medication as directed.  BELOW ARE SYMPTOMS THAT SHOULD BE REPORTED IMMEDIATELY: *FEVER GREATER THAN 100.4 F (38 C) OR HIGHER *CHILLS OR SWEATING *NAUSEA AND VOMITING THAT IS NOT CONTROLLED WITH YOUR NAUSEA MEDICATION *UNUSUAL SHORTNESS OF BREATH *UNUSUAL BRUISING OR BLEEDING *URINARY PROBLEMS (pain or burning when urinating, or frequent urination) *BOWEL PROBLEMS (unusual diarrhea, constipation, pain near the anus) TENDERNESS IN MOUTH AND THROAT WITH OR WITHOUT PRESENCE OF ULCERS (sore throat, sores in mouth, or a toothache) UNUSUAL RASH, SWELLING OR PAIN  UNUSUAL VAGINAL DISCHARGE OR ITCHING   Items with * indicate a potential emergency and should be followed up as soon as possible or go to the Emergency Department if any problems should occur.  Please show the CHEMOTHERAPY ALERT CARD or IMMUNOTHERAPY ALERT CARD at check-in  to the Emergency Department and triage nurse.  Should you have questions after your visit or need to cancel or reschedule your appointment, please contact [facility]  Dept: [phone]  and follow the prompts.  Office hours are 8:00 a.m. to 4:30 p.m. Monday - Friday. Please note that voicemails left after 4:00 p.m. may not be returned until the following business day.  We are closed weekends and major holidays. You have access to a nurse at all times for urgent questions. Please call the main number to the clinic Dept: [phone] and follow the prompts.   For any non-urgent questions, you may also contact your provider using MyChart. We now offer e-Visits for anyone 18 and older to request care online for non-urgent symptoms. For details visit [email or web address].   Also download the MyChart app! Go to the app store, search "MyChart", open the app, select [facility], and log in with your MyChart username and password.  Masks are optional in the cancer centers. If you would like for your care team to wear a mask while they are taking care of you, please let them know. For doctor visits, patients may have with them one support person who is at least 54 years old. At this time, visitors are not allowed in the infusion area. 

## 2021-11-29 NOTE — Progress Notes (Signed)
 [facility] [employer] [address]  [place], [place] [address] [phone]   Interval Evaluation  Date of Service: 11/29/21 Patient Name: [name] Patient MRN: [id number] Patient DOB: 06/13/1965 Provider: Zachary K Vaslow, MD  Identifying Statement:  [name] is a 56 y.o. 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)   11/21/2020 Cancer Staging   Staging form: Brain and Spinal Cord, AJCC 8th Edition - Pathologic stage from 11/21/2020: WHO Grade IV - Signed by Vaslow, Zachary K, MD on 11/30/2020 Histopathologic type: Glioblastoma Stage prefix: Initial diagnosis Histologic grading system: 4 grade system Extent of surgical resection: Subtotal resection Karnofsky performance status: Score 90 Seizures at presentation: Present Duration of symptoms before diagnosis: Short IDH1 mutation: Negative   12/27/2020 - 02/07/2021 Radiation Therapy   IMRT and concurrent Temodar ([place])   03/12/2021 - 08/26/2021 Chemotherapy   Completes 5 cycles of adjuvant 5-day Temodar   08/27/2021 Progression   POD #1   09/06/2021 -  Chemotherapy   Initiates second line therapy; CCNU 90mg/m2 PO q6 weeks and avastin 10mg/kg IV q2 weeks     Biomarkers:  MGMT Methylated.  IDH 1/2 Wild type.  EGFR Unknown  TERT Unknown   Interval History: [name] presents today for avastin infusion, having dosed cycle #2 of CCNU on 11/13/21.  Today she describes occasional headaches.  Sleep has been worse compared to prior.  Denies seizures.  Short term memory impairment and fatigue are still persistent.       H+P (11/25/20) Patient presented to medical attention this past month with first ever seizure.  She describes episode of sudden onset "disconnection" with tongue biting and  urination, followed by period of confusion.  CNS imaging demonstrated enhancing mass within left anterior temporal lobe, which was mostly resected by Dr. Ostergard on 11/21/20.  Since surgery she had not had recurrence of seizures.  She complains of painful cramping of her right leg, which started ~1 week prior.  At this time the cramping is her main complaint.  Otherwise fully functional, independent, would like to return to work if possible.  Medications: Current Outpatient Medications on File Prior to Visit  Medication Sig Dispense Refill   amitriptyline (ELAVIL) 50 MG tablet TAKE 1 TABLET BY MOUTH EVERYDAY AT BEDTIME 90 tablet 2   amLODipine (NORVASC) 10 MG tablet Take 1 tablet (10 mg total) by mouth daily. 30 tablet 2   baclofen 5 MG TABS Take 5 mg by mouth 3 (three) times daily as needed for muscle spasms. 60 tablet 1   [employer] D3 125 MCG (5000 UT) capsule TAKE 1 CAPSULE (5,000 UNITS TOTAL) BY MOUTH DAILY. 90 capsule 3   lacosamide (VIMPAT) 50 MG TABS tablet Take 1 tablet (50 mg total) by mouth 2 (two) times daily. 60 tablet 3   levETIRAcetam (KEPPRA) 750 MG tablet Take 2 tablets (1,500 mg total) by mouth 2 (two) times daily. 360 tablet 1   levothyroxine (SYNTHROID) 100 MCG tablet TAKE 1 TABLET BY MOUTH EVERY DAY 90 tablet 3   LORazepam (ATIVAN) 2 MG tablet Take 1 tablet (2 mg total) by mouth every 8 (eight) hours as needed for seizure. 12 tablet 0   ondansetron (ZOFRAN) 8 MG tablet Take 1 tablet (8 mg total) by mouth 2 (two) times daily as needed. Start on the third day after   chemotherapy. 30 tablet 1   Potassium Chloride ER 20 MEQ TBCR TAKE 1 TABLET BY MOUTH EVERY DAY 90 tablet 3   No current facility-administered medications on file prior to visit.    Allergies: No Known Allergies Past Medical History:  Past Medical History:  Diagnosis Date   Bell's palsy    left side of face- notied in smile and eyes, if very tired   Brain cancer ([name])    Complication of anesthesia    N/V   Essential  hypertension    External hemorrhoids    History of blood transfusion    post op Hemorroids   History of pneumonia    Hyperlipidemia    Hyperthyroidism    hx 56 years old- oral meds   Iron deficiency anemia    Migraine headache    Pneumonia    x 2 - years ago   PONV (postoperative nausea and vomiting)    PPD positive    Pulmonary nodules    not seen on plain cxr, first detected 1/07 re ct 10/08 pos IPPD > 15 mm 12/09 FOB/lavage 04/20/08 neg afb smear   PVC's (premature ventricular contractions)    Seizure ([name]) 11/15/2020   Sleep apnea    refused CPAP- mouth piece recommended   Tuberculosis    in her late 30's   Past Surgical History:  Past Surgical History:  Procedure Laterality Date   ABDOMINAL HYSTERECTOMY     APPLICATION OF CRANIAL NAVIGATION N/A 11/21/2020   Procedure: APPLICATION OF CRANIAL NAVIGATION;  Surgeon: Ostergard, Thomas A, MD;  Location: [facility] OR;  Service: Neurosurgery;  Laterality: N/A;   BREAST BIOPSY Right 2014   BREAST BIOPSY Left 10/29/2019   CHOLECYSTECTOMY  2014   COLONOSCOPY  05/04/2009   prolapsing external hemorrhoids   CRANIOTOMY Left 11/21/2020   Procedure: Left craniotomy for tumor resection;  Surgeon: Ostergard, Thomas A, MD;  Location: [facility] OR;  Service: Neurosurgery;  Laterality: Left;   ENDOMETRIAL ABLATION     HEMORRHOID SURGERY     x 3   TUBAL LIGATION     Social History:  Social History   Socioeconomic History   Marital status: Divorced    Spouse name: Not on file   Number of children: 2   Years of education: Not on file   Highest education level: Associate degree: occupational, technical, or vocational program  Occupational History   Occupation: accounts billing    Employer: [employer]  Tobacco Use   Smoking status: Never   Smokeless tobacco: Never  Vaping Use   Vaping Use: Never used  Substance and Sexual Activity   Alcohol use: No    Alcohol/week: 0.0 standard drinks of alcohol   Drug use: No   Sexual activity: Not on file   Other Topics Concern   Not on file  Social History Narrative   Divorced with 2 children   She works as a supervisor in accounting    Possible TB exposure 2008 (after nodules found)   [place]   Never owned birds      Social Determinants of Health   Financial Resource Strain: Medium Risk (06/13/2021)   Overall Financial Resource Strain (CARDIA)    Difficulty of Paying Living Expenses: Somewhat hard  Food Insecurity: Food Insecurity Present (04/23/2021)   Hunger Vital Sign    Worried About Running Out of Food in the Last Year: Sometimes true    Ran Out of Food in the Last Year: Never true  Transportation   Needs: No Transportation Needs (06/13/2021)   PRAPARE - Hydrologist (Medical): No    Lack of Transportation (Non-Medical): No  Physical Activity: Unknown (04/23/2021)   Exercise Vital Sign    Days of Exercise per Week: 0 days    Minutes of Exercise per Session: Not on file  Stress: No Stress Concern Present (04/23/2021)   [place]    Feeling of Stress : Only a little  Social Connections: Moderately Integrated (04/23/2021)   Social Connection and Isolation Panel [NHANES]    Frequency of Communication with Friends and Family: Twice a week    Frequency of Social Gatherings with Friends and Family: Twice a week    Attends Religious Services: More than 4 times per year    Active Member of [employer] or Organizations: Yes    Attends Music therapist: More than 4 times per year    Marital Status: Divorced  Human resources officer Violence: Not on file   Family History:  Family History  Problem Relation Age of Onset   Diabetes type II Mother    Kidney failure Sister    Diabetes Mellitus II Sister    Colon cancer Maternal Aunt        aunts and uncles   Diabetes Mellitus II Maternal Aunt    Pancreatic cancer Maternal Aunt    Colon cancer Maternal Uncle    Diabetes  Mellitus II Maternal Uncle    Heart disease Maternal Grandmother        aunts, uncles, sister   Diabetes Mellitus II Maternal Grandmother    Lung cancer Other        aunts and uncles    Review of Systems: Constitutional: poor appetite Eyes: blurriness of vision Ears, nose, mouth, throat, and face: Doesn't report sore throat Respiratory: Doesn't report cough, dyspnea or wheezes Cardiovascular: Doesn't report palpitation, chest discomfort  Gastrointestinal:  Doesn't report nausea, constipation, diarrhea GU: Doesn't report incontinence Skin: Doesn't report skin rashes Neurological: Per HPI Musculoskeletal: bilateral shoulder pain Behavioral/Psych: +anxiety  Physical Exam:    11/15/2021   10:56 AM 11/15/2021    8:42 AM 11/02/2021    9:37 AM  Vitals with BMI  Height  5' 8" 5' 8"  Weight  171 lbs 11 oz 172 lbs  BMI  41.74 08.14  Systolic 481 856 314  Diastolic 97 92 86  Pulse 70 79 66    KPS: 70. General: Alert, cooperative, pleasant, in no acute distress Head: Normal EENT: No conjunctival injection or scleral icterus.  Lungs: Resp effort normal Cardiac: Regular rate Abdomen: Non-distended abdomen Skin: No rashes cyanosis or petechiae. Extremities: No clubbing or edema  Neurologic Exam: Mental Status: Awake, alert, attentive to examiner. Oriented to self and environment. Language is notable for modest impairment in fluency.  Impaired short term recall. Cranial Nerves: Visual acuity is grossly normal. Visual fields are full. Extra-ocular movements intact. No ptosis. Face L LMN paresis, chronic. Motor: Tone and bulk are normal. Power is full in both arms and legs. Reflexes are symmetric, no pathologic reflexes present.  Sensory: Intact to light touch Gait: Normal.   Labs: I have reviewed the data as listed    Component Value Date/Time   NA 140 11/15/2021 0838   K 3.6 11/15/2021 0838   CL 106 11/15/2021 0838   CO2 27 11/15/2021 0838   GLUCOSE 97 11/15/2021 0838    BUN 11 11/15/2021 0838   CREATININE 0.94 11/15/2021 9702  CREATININE 0.92 01/25/2020 1424   CALCIUM 9.9 11/15/2021 0838   PROT 7.5 11/15/2021 0838   ALBUMIN 4.6 11/15/2021 0838   AST 15 11/15/2021 0838   ALT 15 11/15/2021 0838   ALKPHOS 136 (H) 11/15/2021 0838   BILITOT 1.0 11/15/2021 0838   GFRNONAA >60 11/15/2021 0838   GFRNONAA 72 01/25/2020 1424   GFRAA 83 01/25/2020 1424   Lab Results  Component Value Date   WBC 2.6 (L) 11/29/2021   NEUTROABS 1.5 (L) 11/29/2021   HGB 13.5 11/29/2021   HCT 38.5 11/29/2021   MCV 92.3 11/29/2021   PLT 137 (L) 11/29/2021     Assessment/Plan 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[place]) [C71.9]  [name] is clinically stable today, now having dose cycle #2 CCNU/avastin.  We appreciated modest decline in expressive language on exam.  Labs are otherwise within normal limits today.  Patient will continue treatment with cycle #2 oral CCNU 67m/m2 q6 weeks and avastin 180mkg IV 2q weeks.  Avastin will be helpful as concurrent therapy given burden of enhancing tumor and steroid requirement.  We reviewed side effects of CCNU, including fatigue, nausea vomiting, cytopenias, ILD.  We reviewed side effects of avastin, including hypertension, bleeding/clotting events, wound healing impairment.  The patient will have a complete blood count, a comprehensive metabolic panel, and urine protein performed prior to each avastin infusion. Labs may need to be performed more often. Zofran will prescribed for home use for nausea/vomiting.   Chemotherapy should be held for the following:  ANC less than 1,000  Platelets less than 100,000  LFT or creatinine greater than 2x ULN  If clinical concerns/contraindications develop  Avastin should be held for the following:  ANC less than 500  Platelets less than 50,000  LFT or creatinine greater than 2x ULN  If clinical concerns/contraindications develop  For seizures, will con't Vimpat  5052mID and Keppra 1500m47mD.  Will con't baclofen 5mg 2m PRN.  She may dose Tylenol or Tramadol PRN for joint pain or severe headaches.  We ask that [name] LEINBACHrn to clinic in 2 weeks for avastin.  Next MRI scan will be scheduled for 12/28/21.  All questions were answered. The patient knows to call the clinic with any problems, questions or concerns. No barriers to learning were detected.  The total time spent in the encounter was 30 minutes and more than 50% was on counseling and review of test results   ZachaVentura SellersMedical Director of Neuro-Oncology [facility]le[facility]aks7/23 12:02 PM

## 2021-11-30 ENCOUNTER — Telehealth: Payer: Self-pay | Admitting: Internal Medicine

## 2021-11-30 ENCOUNTER — Other Ambulatory Visit: Payer: Self-pay | Admitting: Internal Medicine

## 2021-11-30 DIAGNOSIS — C719 Malignant neoplasm of brain, unspecified: Secondary | ICD-10-CM

## 2021-11-30 NOTE — Telephone Encounter (Signed)
Per  7/27 los called and spoke to pt about appointment.  Pt confirmed appointment

## 2021-12-14 ENCOUNTER — Telehealth: Payer: Self-pay

## 2021-12-14 NOTE — Telephone Encounter (Signed)
Patient called and stated that she is on vacation and she has nausea and diarrhea. She stated that she feels like she has food poisoning and the party she is with has the same symptoms.. She stated that she has been drinking plenty of water and the zofran has been effective. She wanted to know what she can take for the counter. Advised her to take OTC Imodium, follow the directions on the pack, and to pick up some Gatorade, or a like brand, as well. She stated that she has started feeling weak and advised that if she does not improve with the imodium then to stop at an urgent care or ED because she is at risk for dehydration. She verbalized understanding.

## 2021-12-17 ENCOUNTER — Inpatient Hospital Stay (HOSPITAL_BASED_OUTPATIENT_CLINIC_OR_DEPARTMENT_OTHER): Payer: BC Managed Care – PPO | Admitting: Internal Medicine

## 2021-12-17 ENCOUNTER — Inpatient Hospital Stay (HOSPITAL_BASED_OUTPATIENT_CLINIC_OR_DEPARTMENT_OTHER): Payer: BC Managed Care – PPO | Admitting: Physician Assistant

## 2021-12-17 ENCOUNTER — Inpatient Hospital Stay: Payer: BC Managed Care – PPO | Attending: Internal Medicine

## 2021-12-17 ENCOUNTER — Other Ambulatory Visit: Payer: Self-pay

## 2021-12-17 ENCOUNTER — Inpatient Hospital Stay: Payer: BC Managed Care – PPO

## 2021-12-17 VITALS — BP 120/93 | HR 84 | Temp 97.7°F | Resp 20 | Wt 161.8 lb

## 2021-12-17 VITALS — BP 116/83 | HR 64 | Temp 97.9°F | Resp 18

## 2021-12-17 DIAGNOSIS — C719 Malignant neoplasm of brain, unspecified: Secondary | ICD-10-CM | POA: Diagnosis not present

## 2021-12-17 DIAGNOSIS — Z801 Family history of malignant neoplasm of trachea, bronchus and lung: Secondary | ICD-10-CM | POA: Insufficient documentation

## 2021-12-17 DIAGNOSIS — Z5111 Encounter for antineoplastic chemotherapy: Secondary | ICD-10-CM | POA: Diagnosis not present

## 2021-12-17 DIAGNOSIS — Z5189 Encounter for other specified aftercare: Secondary | ICD-10-CM | POA: Diagnosis not present

## 2021-12-17 DIAGNOSIS — D701 Agranulocytosis secondary to cancer chemotherapy: Secondary | ICD-10-CM | POA: Insufficient documentation

## 2021-12-17 DIAGNOSIS — Z800 Family history of malignant neoplasm of digestive organs: Secondary | ICD-10-CM | POA: Diagnosis not present

## 2021-12-17 DIAGNOSIS — D6959 Other secondary thrombocytopenia: Secondary | ICD-10-CM | POA: Diagnosis not present

## 2021-12-17 DIAGNOSIS — I10 Essential (primary) hypertension: Secondary | ICD-10-CM | POA: Diagnosis not present

## 2021-12-17 DIAGNOSIS — R569 Unspecified convulsions: Secondary | ICD-10-CM | POA: Diagnosis not present

## 2021-12-17 DIAGNOSIS — Z79899 Other long term (current) drug therapy: Secondary | ICD-10-CM | POA: Diagnosis not present

## 2021-12-17 DIAGNOSIS — R252 Cramp and spasm: Secondary | ICD-10-CM | POA: Insufficient documentation

## 2021-12-17 DIAGNOSIS — Z90710 Acquired absence of both cervix and uterus: Secondary | ICD-10-CM | POA: Insufficient documentation

## 2021-12-17 DIAGNOSIS — C712 Malignant neoplasm of temporal lobe: Secondary | ICD-10-CM | POA: Diagnosis not present

## 2021-12-17 DIAGNOSIS — D696 Thrombocytopenia, unspecified: Secondary | ICD-10-CM | POA: Insufficient documentation

## 2021-12-17 DIAGNOSIS — T451X5A Adverse effect of antineoplastic and immunosuppressive drugs, initial encounter: Secondary | ICD-10-CM | POA: Diagnosis not present

## 2021-12-17 DIAGNOSIS — D709 Neutropenia, unspecified: Secondary | ICD-10-CM | POA: Insufficient documentation

## 2021-12-17 LAB — CBC WITH DIFFERENTIAL (CANCER CENTER ONLY)
Abs Immature Granulocytes: 0.01 10*3/uL (ref 0.00–0.07)
Basophils Absolute: 0 10*3/uL (ref 0.0–0.1)
Basophils Relative: 0 %
Eosinophils Absolute: 0.1 10*3/uL (ref 0.0–0.5)
Eosinophils Relative: 4 %
HCT: 35.9 % — ABNORMAL LOW (ref 36.0–46.0)
Hemoglobin: 12.8 g/dL (ref 12.0–15.0)
Immature Granulocytes: 0 %
Lymphocytes Relative: 31 %
Lymphs Abs: 1 10*3/uL (ref 0.7–4.0)
MCH: 32.5 pg (ref 26.0–34.0)
MCHC: 35.7 g/dL (ref 30.0–36.0)
MCV: 91.1 fL (ref 80.0–100.0)
Monocytes Absolute: 0.2 10*3/uL (ref 0.1–1.0)
Monocytes Relative: 7 %
Neutro Abs: 1.8 10*3/uL (ref 1.7–7.7)
Neutrophils Relative %: 58 %
Platelet Count: 15 10*3/uL — ABNORMAL LOW (ref 150–400)
RBC: 3.94 MIL/uL (ref 3.87–5.11)
RDW: 13.7 % (ref 11.5–15.5)
Smear Review: NORMAL
WBC Count: 3.1 10*3/uL — ABNORMAL LOW (ref 4.0–10.5)
nRBC: 0 % (ref 0.0–0.2)

## 2021-12-17 LAB — CMP (CANCER CENTER ONLY)
ALT: 14 U/L (ref 0–44)
AST: 15 U/L (ref 15–41)
Albumin: 4.7 g/dL (ref 3.5–5.0)
Alkaline Phosphatase: 122 U/L (ref 38–126)
Anion gap: 7 (ref 5–15)
BUN: 16 mg/dL (ref 6–20)
CO2: 26 mmol/L (ref 22–32)
Calcium: 9.7 mg/dL (ref 8.9–10.3)
Chloride: 106 mmol/L (ref 98–111)
Creatinine: 1 mg/dL (ref 0.44–1.00)
GFR, Estimated: >60 mL/min (ref >60)
Glucose, Bld: 90 mg/dL (ref 70–99)
Potassium: 3.4 mmol/L — ABNORMAL LOW (ref 3.5–5.1)
Sodium: 139 mmol/L (ref 135–145)
Total Bilirubin: 3.6 mg/dL (ref 0.3–1.2)
Total Protein: 7.3 g/dL (ref 6.5–8.1)

## 2021-12-17 LAB — TYPE AND SCREEN
ABO/RH(D): O POS
Antibody Screen: NEGATIVE

## 2021-12-17 LAB — TOTAL PROTEIN, URINE DIPSTICK: Protein, ur: <30 mg/dL — AB

## 2021-12-17 MED ORDER — DIPHENHYDRAMINE HCL 50 MG/ML IJ SOLN
50.0000 mg | Freq: Once | INTRAMUSCULAR | Status: AC
  Administered 2021-12-17: 50 mg via INTRAVENOUS

## 2021-12-17 MED ORDER — SODIUM CHLORIDE 0.9% IV SOLUTION: 250.0000 mL | Freq: Once | INTRAVENOUS | Status: DC

## 2021-12-17 MED ORDER — ONDANSETRON HCL 4 MG/2ML IJ SOLN
4.0000 mg | Freq: Once | INTRAMUSCULAR | Status: AC
  Administered 2021-12-17: 4 mg via INTRAVENOUS

## 2021-12-17 MED ORDER — FAMOTIDINE IN NACL 20-0.9 MG/50ML-% IV SOLN
20.0000 mg | Freq: Once | INTRAVENOUS | Status: AC
  Administered 2021-12-17: 20 mg via INTRAVENOUS

## 2021-12-17 MED ORDER — ONDANSETRON 4 MG PO TBDP: 4.0000 mg | ORAL_TABLET | Freq: Three times a day (TID) | ORAL | 0 refills | Status: DC | PRN

## 2021-12-17 NOTE — Addendum Note (Signed)
Addended by: Sherol Dade E on: 12/17/2021 04:56 PM   Modules accepted: Orders

## 2021-12-17 NOTE — Progress Notes (Signed)
Patient receiving platelet infusion noted increasing nausea and chills upon final titration. RN stopped infusion and vitals obtained. Saline bag hung to gravity. Milinda Cave, PA-C notified and assessed at bedside. Received verbal orders for benadryl 50 mg IV, pepcid 20 mg IV, and zofran 4 mg IV.  Patient noting improvement of symptoms following administration of medications.  Please see MAR and flowsheets for medication and vital sign information.

## 2021-12-17 NOTE — Progress Notes (Signed)
[facility] [place], [place] [address] [phone]   Interval Evaluation  Date of Service: 12/17/21 Patient Name: [name] Patient MRN: [id number] Patient DOB: 1965/09/12 Provider: Ventura Sellers, MD  Identifying Statement:  [name] is a 56 y.o. female with left temporal glioblastoma   Oncologic History: Oncology History  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[place])  11/21/2020 Initial Diagnosis   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[place])   11/21/2020 Cancer Staging   Staging form: Brain and Spinal Cord, AJCC 8th Edition - Pathologic stage from 11/21/2020: WHO Grade IV - Signed by Ventura Sellers, MD on 11/30/2020 Histopathologic type: Glioblastoma Stage prefix: Initial diagnosis Histologic grading system: 4 grade system Extent of surgical resection: Subtotal resection Karnofsky performance status: Score 90 Seizures at presentation: Present Duration of symptoms before diagnosis: Short IDH1 mutation: Negative   12/27/2020 - 02/07/2021 Radiation Therapy   IMRT and concurrent Temodar [name])   03/12/2021 - 08/26/2021 Chemotherapy   Completes 5 cycles of adjuvant 5-day Temodar   08/27/2021 Progression   POD #1   09/06/2021 -  Chemotherapy   Initiates second line therapy; CCNU 34m/m2 PO q6 weeks and avastin 127mkg IV q2 weeks     Biomarkers:  MGMT Methylated.  IDH 1/2 Wild type.  EGFR Unknown  TERT Unknown   Interval History: [name] FRUCHTERresents today for avastin infusion, having dosed cycle #2 of CCNU on 11/13/21.  Today she describes some bruising on her arms, back and pelvis.  Last week had 3 days of GI symptoms, vomiting and diarrhea.  She did experience one seizure during her bout of illness, which is now improved.  Short term memory impairment and fatigue are still persistent.       H+P (11/25/20) Patient presented to medical attention this past  month with first ever seizure.  She describes episode of sudden onset "disconnection" with tongue biting and urination, followed by period of confusion.  CNS imaging demonstrated enhancing mass within left anterior temporal lobe, which was mostly resected by Dr. [name] 11/21/20.  Since surgery she had not had recurrence of seizures.  She complains of painful cramping of her right leg, which started ~1 week prior.  At this time the cramping is her main complaint.  Otherwise fully functional, independent, would like to return to work if possible.  Medications: Current Outpatient Medications on File Prior to Visit  Medication Sig Dispense Refill   amitriptyline (ELAVIL) 50 MG tablet TAKE 1 TABLET BY MOUTH EVERYDAY AT BEDTIME 90 tablet 2   amLODipine (NORVASC) 10 MG tablet Take 1 tablet (10 mg total) by mouth daily. 30 tablet 2   baclofen 5 MG TABS Take 5 mg by mouth 3 (three) times daily as needed for muscle spasms. 60 tablet 1   CVS D3 125 MCG (5000 UT) capsule TAKE 1 CAPSULE (5,000 UNITS TOTAL) BY MOUTH DAILY. 90 capsule 3   lacosamide (VIMPAT) 50 MG TABS tablet Take 1 tablet (50 mg total) by mouth 2 (two) times daily. 60 tablet 3   levETIRAcetam (KEPPRA) 750 MG tablet Take 2 tablets (1,500 mg total) by mouth 2 (two) times daily. 360 tablet 1   levothyroxine (SYNTHROID) 100 MCG tablet TAKE 1 TABLET BY MOUTH EVERY DAY 90 tablet 3   LORazepam (ATIVAN) 2 MG tablet Take 1 tablet (2 mg total) by mouth every 8 (eight) hours as needed for seizure. 12 tablet 0   ondansetron (ZOFRAN) 8  MG tablet Take 1 tablet (8 mg total) by mouth 2 (two) times daily as needed. Start on the third day after chemotherapy. 30 tablet 1   Potassium Chloride ER 20 MEQ TBCR TAKE 1 TABLET BY MOUTH EVERY DAY 90 tablet 3   No current facility-administered medications on file prior to visit.    Allergies: No Known Allergies Past Medical History:  Past Medical History:  Diagnosis Date   Bell's palsy    left side of face-  notied in smile and eyes, if very tired   Brain cancer ([place])    Complication of anesthesia    N/V   Essential hypertension    External hemorrhoids    History of blood transfusion    post op Hemorroids   History of pneumonia    Hyperlipidemia    Hyperthyroidism    hx 56 years old- oral meds   Iron deficiency anemia    Migraine headache    Pneumonia    x 2 - years ago   PONV (postoperative nausea and vomiting)    PPD positive    Pulmonary nodules    not seen on plain cxr, first detected 1/07 re ct 10/08 pos IPPD > 15 mm 12/09 FOB/lavage 04/20/08 neg afb smear   PVC's (premature ventricular contractions)    Seizure ([place]) 11/15/2020   Sleep apnea    refused CPAP- mouth piece recommended   Tuberculosis    in her late 30's   Past Surgical History:  Past Surgical History:  Procedure Laterality Date   ABDOMINAL HYSTERECTOMY     APPLICATION OF CRANIAL NAVIGATION N/A 11/21/2020   Procedure: APPLICATION OF CRANIAL NAVIGATION;  Surgeon: Judith Part, MD;  Location: [facility];  Service: Neurosurgery;  Laterality: N/A;   BREAST BIOPSY Right 2014   BREAST BIOPSY Left 10/29/2019   CHOLECYSTECTOMY  2014   COLONOSCOPY  05/04/2009   prolapsing external hemorrhoids   CRANIOTOMY Left 11/21/2020   Procedure: Left craniotomy for tumor resection;  Surgeon: Judith Part, MD;  Location: [facility];  Service: Neurosurgery;  Laterality: Left;   ENDOMETRIAL ABLATION     HEMORRHOID SURGERY     x 3   TUBAL LIGATION     Social History:  Social History   Socioeconomic History   Marital status: Divorced    Spouse name: Not on file   Number of children: 2   Years of education: Not on file   Highest education level: Associate degree: occupational, Hotel manager, or vocational program  Occupational History   Occupation: accounts Sport and exercise psychologist: [employer]  Tobacco Use   Smoking status: Never   Smokeless tobacco: Never  Vaping Use   Vaping Use: Never used  Substance and Sexual Activity    Alcohol use: No    Alcohol/week: 0.0 standard drinks of alcohol   Drug use: No   Sexual activity: Not on file  Other Topics Concern   Not on file  Social History Narrative   Divorced with 2 children   She works as a Librarian, 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[employer]    Possible TB exposure 2008 (after nodules found)   From [place] lived there and [place]   Never owned birds      Social Determinants of Startup Strain: [place] (06/13/2021)   Overall Financial Resource Strain (CARDIA)    Difficulty of Paying Living Expenses: Somewhat hard  Food Insecurity: Food Insecurity Present (04/23/2021)   Hunger Vital Sign    Worried About Running Out of  Food in the Last Year: Sometimes true    Ran Out of Food in the Last Year: Never true  Transportation Needs: No Transportation Needs (06/13/2021)   PRAPARE - Hydrologist (Medical): No    Lack of Transportation (Non-Medical): No  Physical Activity: Unknown (04/23/2021)   Exercise Vital Sign    Days of Exercise per Week: 0 days    Minutes of Exercise per Session: Not on file  Stress: No Stress Concern Present (04/23/2021)   [place]    Feeling of Stress : Only a little  Social Connections: Moderately Integrated (04/23/2021)   Social Connection and Isolation Panel [NHANES]    Frequency of Communication with Friends and Family: Twice a week    Frequency of Social Gatherings with Friends and Family: Twice a week    Attends Religious Services: More than 4 times per year    Active Member of [employer] or Organizations: Yes    Attends Music therapist: More than 4 times per year    Marital Status: Divorced  Human resources officer Violence: Not on file   Family History:  Family History  Problem Relation Age of Onset   Diabetes type II Mother    Kidney failure Sister    Diabetes Mellitus II Sister    Colon cancer Maternal Aunt        aunts and uncles    Diabetes Mellitus II Maternal Aunt    Pancreatic cancer Maternal Aunt    Colon cancer Maternal Uncle    Diabetes Mellitus II Maternal Uncle    Heart disease Maternal Grandmother        aunts, uncles, sister   Diabetes Mellitus II Maternal Grandmother    Lung cancer Other        aunts and uncles    Review of Systems: Constitutional: poor appetite Eyes: blurriness of vision Ears, nose, mouth, throat, and face: Doesn't report sore throat Respiratory: Doesn't report cough, dyspnea or wheezes Cardiovascular: Doesn't report palpitation, chest discomfort  Gastrointestinal:  Doesn't report nausea, constipation, diarrhea GU: Doesn't report incontinence Skin: Doesn't report skin rashes Neurological: Per HPI Musculoskeletal: bilateral shoulder pain Behavioral/Psych: +anxiety  Physical Exam: Vitals:   12/17/21 0910  BP: (!) 120/93  Pulse: 84  Resp: 20  Temp: 97.7 F (36.5 C)  SpO2: 99%   KPS: 70. General: Alert, cooperative, pleasant, in no acute distress Head: Normal EENT: No conjunctival injection or scleral icterus.  Lungs: Resp effort normal Cardiac: Regular rate Abdomen: Non-distended abdomen Skin: No rashes cyanosis or petechiae. Extremities: No clubbing or edema  Neurologic Exam: Mental Status: Awake, alert, attentive to examiner. Oriented to self and environment. Language is notable for modest impairment in fluency.  Impaired short term recall. Cranial Nerves: Visual acuity is grossly normal. Visual fields are full. Extra-ocular movements intact. No ptosis. Face L LMN paresis, chronic. Motor: Tone and bulk are normal. Power is full in both arms and legs. Reflexes are symmetric, no pathologic reflexes present.  Sensory: Intact to light touch Gait: Normal.   Labs: I have reviewed the data as listed    Component Value Date/Time   NA 139 12/17/2021 0842   K 3.4 (L) 12/17/2021 0842   CL 106 12/17/2021 0842   CO2 26 12/17/2021 0842   GLUCOSE 90 12/17/2021 0842   BUN  16 12/17/2021 0842   CREATININE 1.00 12/17/2021 0842   CREATININE 0.92 01/25/2020 1424   CALCIUM 9.7 12/17/2021 0842  PROT 7.3 12/17/2021 0842   ALBUMIN 4.7 12/17/2021 0842   AST 15 12/17/2021 0842   ALT 14 12/17/2021 0842   ALKPHOS 122 12/17/2021 0842   BILITOT 3.6 (HH) 12/17/2021 0842   GFRNONAA >60 12/17/2021 0842   GFRNONAA 72 01/25/2020 1424   GFRAA 83 01/25/2020 1424   Lab Results  Component Value Date   WBC 3.1 (L) 12/17/2021   NEUTROABS 1.8 12/17/2021   HGB 12.8 12/17/2021   HCT 35.9 (L) 12/17/2021   MCV 91.1 12/17/2021   PLT 15 (L) 12/17/2021     Assessment/Plan 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[place]) [C71.9]  [name] is clinically stable today, now day 35/42 of cycle #2 CCNU/avastin.  Labs are notably today for significant thrombocytopenia (15), with noted purupa on extremities.  We will hold off on avastin today and proceed instead with transfusion of 1 unit of platelets.  Patient will continue treatment with cycle #2 oral CCNU 74m/m2 q6 weeks and avastin 171mkg IV 2q weeks.  Avastin will be helpful as concurrent therapy given burden of enhancing tumor and steroid requirement.  We reviewed side effects of CCNU, including fatigue, nausea vomiting, cytopenias, ILD.  We reviewed side effects of avastin, including hypertension, bleeding/clotting events, wound healing impairment.  The patient will have a complete blood count, a comprehensive metabolic panel, and urine protein performed prior to each avastin infusion. Labs may need to be performed more often. Zofran will prescribed for home use for nausea/vomiting.   Chemotherapy should be held for the following:  ANC less than 1,000  Platelets less than 100,000  LFT or creatinine greater than 2x ULN  If clinical concerns/contraindications develop  Avastin should be held for the following:  ANC less than 500  Platelets less than 50,000  LFT or creatinine greater than 2x ULN  If  clinical concerns/contraindications develop  Total bilirubin is also elevated today.  This may be attributed to chemotherapy + recent GI viral illness.  Will continue to monitor with repeat labs next week.  She should return in 1 week for CBC, CMP and planned avastin, if able.  MRI scheduled for next Friday.  For seizures, will con't Vimpat 5041mID and Keppra 1500m22mD.  Seizure this past week would be considered provoked in nature given concurrent viral illness.  Will con't baclofen 5mg 18m PRN.  She may dose Tylenol or Tramadol PRN for joint pain or severe headaches.  We ask that [name] to clinic in 1 weeks for labs and potential avastin, pending repeat cbc.  MRI scan scheduled for 12/28/21.  All questions were answered. The patient knows to call the clinic with any problems, questions or concerns. No barriers to learning were detected.  The total time spent in the encounter was 40 minutes and more than 50% was on counseling and review of test results   ZachaVentura SellersMedical Director of Neuro-Oncology [facility]leBig Wells4/23 9:02 AM

## 2021-12-17 NOTE — Progress Notes (Addendum)
Briefly this is a 56 year old female with history of left temporal glioblastoma.  She was seen earlier today by neuro oncologist Dr. Mickeal Skinner.  Please see his note for full history with assessment and plan.  I was called to infusion center for patient having chills with nausea and vomiting shortly after her platelet infusion had started.  RN thought there might be a platelet reaction so Benadryl and Pepcid were given.  Upon further discussion with patient the symptoms have been ongoing for several days. Patient has a non tender abdomen. Patient given Zofran and IV fluids and symptoms resolved.  Patient tells me she does not have any antiemetics at home besides her Ativan for seizures for I sent a prescription to her pharmacy for PRN Zofran ODT. She was able to tolerate PO intake after IVF medications and discharged from clinic in stable condition.

## 2021-12-18 ENCOUNTER — Ambulatory Visit (INDEPENDENT_AMBULATORY_CARE_PROVIDER_SITE_OTHER): Payer: BC Managed Care – PPO | Admitting: Adult Health

## 2021-12-18 ENCOUNTER — Encounter: Payer: Self-pay | Admitting: Adult Health

## 2021-12-18 VITALS — BP 120/80 | HR 70 | Temp 98.5°F | Ht 68.0 in | Wt 163.0 lb

## 2021-12-18 DIAGNOSIS — R682 Dry mouth, unspecified: Secondary | ICD-10-CM | POA: Diagnosis not present

## 2021-12-18 DIAGNOSIS — J029 Acute pharyngitis, unspecified: Secondary | ICD-10-CM | POA: Diagnosis not present

## 2021-12-18 LAB — BPAM PLATELET PHERESIS
Blood Product Expiration Date: 202308152359
Blood Product Expiration Date: 202308172359
ISSUE DATE / TIME: 202308141231
Unit Type and Rh: 5100
Unit Type and Rh: 7300

## 2021-12-18 LAB — PREPARE PLATELET PHERESIS
Unit division: 0
Unit division: 0

## 2021-12-18 NOTE — Patient Instructions (Signed)
Your throat looks great.   I would try Biotene mouth wash and lozenges

## 2021-12-18 NOTE — Progress Notes (Signed)
Subjective:    Patient ID: [name], female    DOB: [birth year], 56 y.o.   MRN: [id number]  HPI 56 year old female who  has a past medical history of Bell's palsy, Brain cancer ([place]), Complication of anesthesia, Essential hypertension, External hemorrhoids, History of blood transfusion, History of pneumonia, Hyperlipidemia, Hyperthyroidism, Iron deficiency anemia, Migraine headache, Pneumonia, PONV (postoperative nausea and vomiting), PPD positive, Pulmonary nodules, PVC's (premature ventricular contractions), Seizure ([place]) (11/15/2020), Sleep apnea, and Tuberculosis.  She presents to the office today for an acute issue of sore throat x 1 week. She is going through chemo for glioblastoma and reports that the chemo makes her mouth dry and her throat sore. She has not had any fevers but last week she did develop vomiting and diarrhea - she also reports other people she was with developed the GI issues but no sore throat.  He does report that it is painful when she swallows.  She wants to make sure that she does not have strep throat  Review of Systems See HPI   Past Medical History:  Diagnosis Date   Bell's palsy    left side of face- notied in smile and eyes, if very tired   Brain cancer ([place])    Complication of anesthesia    N/V   Essential hypertension    External hemorrhoids    History of blood transfusion    post op Hemorroids   History of pneumonia    Hyperlipidemia    Hyperthyroidism    hx 56 years old- oral meds   Iron deficiency anemia    Migraine headache    Pneumonia    x 2 - years ago   PONV (postoperative nausea and vomiting)    PPD positive    Pulmonary nodules    not seen on plain cxr, first detected 1/07 re ct 10/08 pos IPPD > 15 mm 12/09 FOB/lavage 04/20/08 neg afb smear   PVC's (premature ventricular contractions)    Seizure ([place]) 11/15/2020   Sleep apnea    refused CPAP- mouth piece recommended   Tuberculosis    in her late 5's    Social History    Socioeconomic History   Marital status: Divorced    Spouse name: Not on file   Number of children: 2   Years of education: Not on file   Highest education level: Associate degree: occupational, Hotel manager, or vocational program  Occupational History   Occupation: accounts Sport and exercise psychologist: [employer]  Tobacco Use   Smoking status: Never   Smokeless tobacco: Never  Vaping Use   Vaping Use: Never used  Substance and Sexual Activity   Alcohol use: No    Alcohol/week: 0.0 standard drinks of alcohol   Drug use: No   Sexual activity: Not on file  Other Topics Concern   Not on file  Social History Narrative   Divorced with 2 children   She works as a Librarian, 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 (after nodules found)   From [place] lived [place] and [place]   Never owned birds      Social Determinants of Health   Financial Resource Strain: Medium Risk (06/13/2021)   Overall Financial Resource Strain (CARDIA)    Difficulty of Paying Living Expenses: Somewhat hard  Food Insecurity: Food Insecurity Present (04/23/2021)   Hunger Vital Sign    Worried About Running Out of Food in the Last Year: Sometimes true    Ran Out of  Food in the Last Year: Never true  Transportation Needs: No Transportation Needs (06/13/2021)   PRAPARE - Hydrologist (Medical): No    Lack of Transportation (Non-Medical): No  Physical Activity: Unknown (04/23/2021)   Exercise Vital Sign    Days of Exercise per Week: 0 days    Minutes of Exercise per Session: Not on file  Stress: No Stress Concern Present (04/23/2021)   [place]    Feeling of Stress : Only a little  Social Connections: Moderately Integrated (04/23/2021)   Social Connection and Isolation Panel [NHANES]    Frequency of Communication with Friends and Family: Twice a week    Frequency of Social Gatherings with Friends and Family: Twice a week     Attends Religious Services: More than 4 times per year    Active Member of [employer] or Organizations: Yes    Attends Music therapist: More than 4 times per year    Marital Status: Divorced  Human resources officer Violence: Not on file    Past Surgical History:  Procedure Laterality Date   ABDOMINAL HYSTERECTOMY     APPLICATION OF CRANIAL NAVIGATION N/A 11/21/2020   Procedure: [place];  Surgeon: Judith Part, MD;  Location: [facility];  Service: Neurosurgery;  Laterality: N/A;   BREAST BIOPSY Right 2014   BREAST BIOPSY Left 10/29/2019   CHOLECYSTECTOMY  2014   COLONOSCOPY  05/04/2009   prolapsing external hemorrhoids   CRANIOTOMY Left 11/21/2020   Procedure: Left craniotomy for tumor resection;  Surgeon: Judith Part, MD;  Location: [facility];  Service: Neurosurgery;  Laterality: Left;   ENDOMETRIAL ABLATION     HEMORRHOID SURGERY     x 3   TUBAL LIGATION      Family History  Problem Relation Age of Onset   Diabetes type II Mother    Kidney failure Sister    Diabetes Mellitus II Sister    Colon cancer Maternal Aunt        aunts and uncles   Diabetes Mellitus II Maternal Aunt    Pancreatic cancer Maternal Aunt    Colon cancer Maternal Uncle    Diabetes Mellitus II Maternal Uncle    Heart disease Maternal Grandmother        aunts, uncles, sister   Diabetes Mellitus II Maternal Grandmother    Lung cancer Other        aunts and uncles    Not on File  Current Outpatient Medications on File Prior to Visit  Medication Sig Dispense Refill   amitriptyline (ELAVIL) 50 MG tablet TAKE 1 TABLET BY MOUTH EVERYDAY AT BEDTIME 90 tablet 2   amLODipine (NORVASC) 10 MG tablet Take 1 tablet (10 mg total) by mouth daily. 30 tablet 2   baclofen 5 MG TABS Take 5 mg by mouth 3 (three) times daily as needed for muscle spasms. 60 tablet 1   CVS D3 125 MCG (5000 UT) capsule TAKE 1 CAPSULE (5,000 UNITS TOTAL) BY MOUTH DAILY. 90 capsule 3   GLEOSTINE 100 MG  capsule Take by mouth.     GLEOSTINE 40 MG capsule Take by mouth.     lacosamide (VIMPAT) 50 MG TABS tablet Take 1 tablet (50 mg total) by mouth 2 (two) times daily. 60 tablet 3   levETIRAcetam (KEPPRA) 750 MG tablet Take 2 tablets (1,500 mg total) by mouth 2 (two) times daily. 360 tablet 1   levothyroxine (SYNTHROID) 100  MCG tablet TAKE 1 TABLET BY MOUTH EVERY DAY 90 tablet 3   LORazepam (ATIVAN) 2 MG tablet Take 1 tablet (2 mg total) by mouth every 8 (eight) hours as needed for seizure. 12 tablet 0   ondansetron (ZOFRAN-ODT) 4 MG disintegrating tablet Take 1 tablet (4 mg total) by mouth every 8 (eight) hours as needed for nausea or vomiting. 10 tablet 0   Potassium Chloride ER 20 MEQ TBCR TAKE 1 TABLET BY MOUTH EVERY DAY 90 tablet 3   No current facility-administered medications on file prior to visit.    BP 120/80   Pulse 70   Temp 98.5 F (36.9 C) (Oral)   Ht '5\' 8"'$  (1.727 m)   Wt 163 lb (73.9 kg)   SpO2 99%   BMI 24.78 kg/m       Objective:   Physical Exam Vitals and nursing note reviewed.  Constitutional:      Appearance: She is well-developed.  HENT:     Mouth/Throat:     Mouth: Mucous membranes are moist.     Pharynx: Oropharynx is clear. No pharyngeal swelling or posterior oropharyngeal erythema.     Tonsils: No tonsillar exudate or tonsillar abscesses. 0 on the right.  Skin:    General: Skin is warm and dry.  Neurological:     General: No focal deficit present.     Mental Status: She is alert and oriented to person, place, and time.       Assessment & Plan:   No signs of strep throat or infection of any kind.  Encouraged to try using Biotene mouthwash and throat lozenges to see if this helps. - Follow up as needed  [name], NP

## 2021-12-25 ENCOUNTER — Inpatient Hospital Stay (HOSPITAL_BASED_OUTPATIENT_CLINIC_OR_DEPARTMENT_OTHER): Payer: BC Managed Care – PPO | Admitting: Internal Medicine

## 2021-12-25 ENCOUNTER — Other Ambulatory Visit: Payer: Self-pay

## 2021-12-25 ENCOUNTER — Inpatient Hospital Stay: Payer: BC Managed Care – PPO

## 2021-12-25 ENCOUNTER — Other Ambulatory Visit: Payer: Self-pay | Admitting: Radiation Therapy

## 2021-12-25 VITALS — BP 140/90 | HR 78 | Temp 97.5°F | Resp 14 | Wt 164.4 lb

## 2021-12-25 DIAGNOSIS — D701 Agranulocytosis secondary to cancer chemotherapy: Secondary | ICD-10-CM | POA: Diagnosis not present

## 2021-12-25 DIAGNOSIS — C719 Malignant neoplasm of brain, unspecified: Secondary | ICD-10-CM | POA: Diagnosis not present

## 2021-12-25 DIAGNOSIS — Z79899 Other long term (current) drug therapy: Secondary | ICD-10-CM | POA: Diagnosis not present

## 2021-12-25 DIAGNOSIS — Z801 Family history of malignant neoplasm of trachea, bronchus and lung: Secondary | ICD-10-CM | POA: Diagnosis not present

## 2021-12-25 DIAGNOSIS — Z90710 Acquired absence of both cervix and uterus: Secondary | ICD-10-CM | POA: Diagnosis not present

## 2021-12-25 DIAGNOSIS — Z5189 Encounter for other specified aftercare: Secondary | ICD-10-CM | POA: Diagnosis not present

## 2021-12-25 DIAGNOSIS — C712 Malignant neoplasm of temporal lobe: Secondary | ICD-10-CM | POA: Diagnosis not present

## 2021-12-25 DIAGNOSIS — D6959 Other secondary thrombocytopenia: Secondary | ICD-10-CM | POA: Diagnosis not present

## 2021-12-25 DIAGNOSIS — T451X5A Adverse effect of antineoplastic and immunosuppressive drugs, initial encounter: Secondary | ICD-10-CM

## 2021-12-25 DIAGNOSIS — I10 Essential (primary) hypertension: Secondary | ICD-10-CM | POA: Diagnosis not present

## 2021-12-25 DIAGNOSIS — Z5111 Encounter for antineoplastic chemotherapy: Secondary | ICD-10-CM | POA: Diagnosis not present

## 2021-12-25 DIAGNOSIS — R569 Unspecified convulsions: Secondary | ICD-10-CM

## 2021-12-25 DIAGNOSIS — Z800 Family history of malignant neoplasm of digestive organs: Secondary | ICD-10-CM | POA: Diagnosis not present

## 2021-12-25 DIAGNOSIS — R252 Cramp and spasm: Secondary | ICD-10-CM | POA: Diagnosis not present

## 2021-12-25 LAB — CBC WITH DIFFERENTIAL (CANCER CENTER ONLY)
Abs Immature Granulocytes: 0 10*3/uL (ref 0.00–0.07)
Basophils Absolute: 0 10*3/uL (ref 0.0–0.1)
Basophils Relative: 0 %
Eosinophils Absolute: 0.1 10*3/uL (ref 0.0–0.5)
Eosinophils Relative: 3 %
HCT: 29.3 % — ABNORMAL LOW (ref 36.0–46.0)
Hemoglobin: 10.6 g/dL — ABNORMAL LOW (ref 12.0–15.0)
Immature Granulocytes: 0 %
Lymphocytes Relative: 58 %
Lymphs Abs: 0.9 10*3/uL (ref 0.7–4.0)
MCH: 33 pg (ref 26.0–34.0)
MCHC: 36.2 g/dL — ABNORMAL HIGH (ref 30.0–36.0)
MCV: 91.3 fL (ref 80.0–100.0)
Monocytes Absolute: 0.1 10*3/uL (ref 0.1–1.0)
Monocytes Relative: 7 %
Neutro Abs: 0.5 10*3/uL — ABNORMAL LOW (ref 1.7–7.7)
Neutrophils Relative %: 32 %
Platelet Count: 23 10*3/uL — ABNORMAL LOW (ref 150–400)
RBC: 3.21 MIL/uL — ABNORMAL LOW (ref 3.87–5.11)
RDW: 13.1 % (ref 11.5–15.5)
Smear Review: NORMAL
WBC Count: 1.5 10*3/uL — ABNORMAL LOW (ref 4.0–10.5)
nRBC: 0 % (ref 0.0–0.2)

## 2021-12-25 LAB — CMP (CANCER CENTER ONLY)
ALT: 23 U/L (ref 0–44)
AST: 19 U/L (ref 15–41)
Albumin: 4.5 g/dL (ref 3.5–5.0)
Alkaline Phosphatase: 106 U/L (ref 38–126)
Anion gap: 5 (ref 5–15)
BUN: 14 mg/dL (ref 6–20)
CO2: 29 mmol/L (ref 22–32)
Calcium: 10.1 mg/dL (ref 8.9–10.3)
Chloride: 107 mmol/L (ref 98–111)
Creatinine: 0.89 mg/dL (ref 0.44–1.00)
GFR, Estimated: >60 mL/min (ref >60)
Glucose, Bld: 95 mg/dL (ref 70–99)
Potassium: 3.3 mmol/L — ABNORMAL LOW (ref 3.5–5.1)
Sodium: 141 mmol/L (ref 135–145)
Total Bilirubin: 2.6 mg/dL — ABNORMAL HIGH (ref 0.3–1.2)
Total Protein: 6.8 g/dL (ref 6.5–8.1)

## 2021-12-25 LAB — TOTAL PROTEIN, URINE DIPSTICK: Protein, ur: <30 mg/dL — AB

## 2021-12-25 MED ORDER — FILGRASTIM-SNDZ 300 MCG/0.5ML IJ SOSY
300.0000 ug | PREFILLED_SYRINGE | Freq: Once | INTRAMUSCULAR | Status: AC
  Administered 2021-12-25: 300 ug via SUBCUTANEOUS
  Filled 2021-12-25: qty 0.5

## 2021-12-25 MED ORDER — LORAZEPAM 2 MG PO TABS: 2.0000 mg | ORAL_TABLET | Freq: Three times a day (TID) | ORAL | 0 refills | Status: DC | PRN

## 2021-12-25 MED ORDER — LACOSAMIDE 50 MG PO TABS: ORAL_TABLET | ORAL | 3 refills | Status: DC

## 2021-12-25 NOTE — Patient Instructions (Signed)
Filgrastim Injection What is this medication? FILGRASTIM (fil GRA stim) lowers the risk of infection in people who are receiving chemotherapy. It works by helping your body make more white blood cells, which protects your body from infection. It may also be used to help people who have been exposed to high doses of radiation. It can be used to help prepare your body before a stem cell transplant. It works by helping your bone marrow make and release stem cells into the blood. This medicine may be used for other purposes; ask your health care provider or pharmacist if you have questions. COMMON BRAND NAME(S): Neupogen, Nivestym, Releuko, Zarxio What should I tell my care team before I take this medication? They need to know if you have any of these conditions: History of blood diseases, such as sickle cell anemia Kidney disease Recent or ongoing radiation An unusual or allergic reaction to filgrastim, pegfilgrastim, latex, rubber, other medications, foods, dyes, or preservatives Pregnant or trying to get pregnant Breast-feeding How should I use this medication? This medication is injected under the skin or into a vein. It is usually given by your care team in a hospital or clinic setting. It may be given at home. If you get this medication at home, you will be taught how to prepare and give it. Use exactly as directed. Take it as directed on the prescription label at the same time every day. Keep taking it unless your care team tells you to stop. It is important that you put your used needles and syringes in a special sharps container. Do not put them in a trash can. If you do not have a sharps container, call your pharmacist or care team to get one. This medication comes with INSTRUCTIONS FOR USE. Ask your pharmacist for directions on how to use this medication. Read the information carefully. Talk to your pharmacist or care team if you have questions. Talk to your care team about the use of this  medication in children. While it may be prescribed for children for selected conditions, precautions do apply. Overdosage: If you think you have taken too much of this medicine contact a poison control center or emergency room at once. NOTE: This medicine is only for you. Do not share this medicine with others. What if I miss a dose? It is important not to miss any doses. Talk to your care team about what to do if you miss a dose. What may interact with this medication? Medications that may cause a release of neutrophils, such as lithium This list may not describe all possible interactions. Give your health care provider a list of all the medicines, herbs, non-prescription drugs, or dietary supplements you use. Also tell them if you smoke, drink alcohol, or use illegal drugs. Some items may interact with your medicine. What should I watch for while using this medication? Your condition will be monitored carefully while you are receiving this medication. You may need bloodwork while taking this medication. Talk to your care team about your risk of cancer. You may be more at risk for certain types of cancer if you take this medication. What side effects may I notice from receiving this medication? Side effects that you should report to your care team as soon as possible: Allergic reactions--skin rash, itching, hives, swelling of the face, lips, tongue, or throat Capillary leak syndrome--stomach or muscle pain, unusual weakness or fatigue, feeling faint or lightheaded, decrease in the amount of urine, swelling of the ankles, hands, or   feet, trouble breathing High white blood cell level--fever, fatigue, trouble breathing, night sweats, change in vision, weight loss Inflammation of the aorta--fever, fatigue, back, chest, or stomach pain, severe headache Kidney injury (glomerulonephritis)--decrease in the amount of urine, red or dark brown urine, foamy or bubbly urine, swelling of the ankles, hands, or  feet Shortness of breath or trouble breathing Spleen injury--pain in upper left stomach or shoulder Unusual bruising or bleeding Side effects that usually do not require medical attention (report to your care team if they continue or are bothersome): Back pain Bone pain Fatigue Fever Headache Nausea This list may not describe all possible side effects. Call your doctor for medical advice about side effects. You may report side effects to FDA at 1-800-FDA-1088. Where should I keep my medication? Keep out of the reach of children and pets. Keep this medication in the original packaging until you are ready to take it. Protect from light. See product for storage information. Each product may have different instructions. Get rid of any unused medication after the expiration date. To get rid of medications that are no longer needed or have expired: Take the medication to a medications take-back program. Check with your pharmacy or law enforcement to find a location. If you cannot return the medication, ask your pharmacist or care team how to get rid of this medication safely. NOTE: This sheet is a summary. It may not cover all possible information. If you have questions about this medicine, talk to your doctor, pharmacist, or health care provider.  2023 Elsevier/Gold Standard (2021-07-31 00:00:00)  

## 2021-12-25 NOTE — Progress Notes (Signed)
[facility] [place], [place] [address] [phone]   Interval Evaluation  Date of Service: 12/25/21 Patient Name: [name] Patient MRN: [id number] Patient DOB: Jul 19, 1965 Provider: Ventura Sellers, MD  Identifying Statement:  [name] is a 56 y.o. female with left temporal glioblastoma   Oncologic History: Oncology History  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[place])  11/21/2020 Initial Diagnosis   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[place])   11/21/2020 Cancer Staging   Staging form: Brain and Spinal Cord, AJCC 8th Edition - Pathologic stage from 11/21/2020: WHO Grade IV - Signed by Ventura Sellers, MD on 11/30/2020 Histopathologic type: Glioblastoma Stage prefix: Initial diagnosis Histologic grading system: 4 grade system Extent of surgical resection: Subtotal resection Karnofsky performance status: Score 90 Seizures at presentation: Present Duration of symptoms before diagnosis: Short IDH1 mutation: Negative   12/27/2020 - 02/07/2021 Radiation Therapy   IMRT and concurrent Temodar [name])   03/12/2021 - 08/26/2021 Chemotherapy   Completes 5 cycles of adjuvant 5-day Temodar   08/27/2021 Progression   POD #1   09/06/2021 -  Chemotherapy   Initiates second line therapy; CCNU 36m/m2 PO q6 weeks and avastin 171mkg IV q2 weeks     Biomarkers:  MGMT Methylated.  IDH 1/2 Wild type.  EGFR Unknown  TERT Unknown   Interval History: [name] for avastin infusion, now having completed cycle #2 of CCNU.  No further bruising in arms or legs.  She did experience a seizure in church on Sunday, described as "shaking of my right arm and leg for several minutes".  No issues with the platelet transfusion.  Short term memory impairment and fatigue are still persistent.       H+P (11/25/20) Patient presented to medical attention this past month with first  ever seizure.  She describes episode of sudden onset "disconnection" with tongue biting and urination, followed by period of confusion.  CNS imaging demonstrated enhancing mass within left anterior temporal lobe, which was mostly resected by Dr. [name] 11/21/20.  Since surgery she had not had recurrence of seizures.  She complains of painful cramping of her right leg, which started ~1 week prior.  At this time the cramping is her main complaint.  Otherwise fully functional, independent, would like to return to work if possible.  Medications: Current Outpatient Medications on File Prior to Visit  Medication Sig Dispense Refill   amitriptyline (ELAVIL) 50 MG tablet TAKE 1 TABLET BY MOUTH EVERYDAY AT BEDTIME 90 tablet 2   amLODipine (NORVASC) 10 MG tablet Take 1 tablet (10 mg total) by mouth daily. 30 tablet 2   baclofen 5 MG TABS Take 5 mg by mouth 3 (three) times daily as needed for muscle spasms. 60 tablet 1   [employer] D3 125 MCG (5000 UT) capsule TAKE 1 CAPSULE (5,000 UNITS TOTAL) BY MOUTH DAILY. 90 capsule 3   GLEOSTINE 100 MG capsule Take by mouth.     GLEOSTINE 40 MG capsule Take by mouth.     lacosamide (VIMPAT) 50 MG TABS tablet Take 1 tablet (50 mg total) by mouth 2 (two) times daily. 60 tablet 3   levETIRAcetam (KEPPRA) 750 MG tablet Take 2 tablets (1,500 mg total) by mouth 2 (two) times daily. 360 tablet 1   levothyroxine (SYNTHROID) 100 MCG tablet TAKE 1 TABLET BY MOUTH EVERY DAY 90 tablet 3   LORazepam (ATIVAN) 2 MG tablet Take 1 tablet (2 mg  total) by mouth every 8 (eight) hours as needed for seizure. 12 tablet 0   ondansetron (ZOFRAN-ODT) 4 MG disintegrating tablet Take 1 tablet (4 mg total) by mouth every 8 (eight) hours as needed for nausea or vomiting. 10 tablet 0   Potassium Chloride ER 20 MEQ TBCR TAKE 1 TABLET BY MOUTH EVERY DAY 90 tablet 3   No current facility-administered medications on file prior to visit.    Allergies: Not on File Past Medical History:  Past Medical  History:  Diagnosis Date   Bell's palsy    left side of face- notied in smile and eyes, if very tired   Brain cancer ([place])    Complication of anesthesia    N/V   Essential hypertension    External hemorrhoids    History of blood transfusion    post op Hemorroids   History of pneumonia    Hyperlipidemia    Hyperthyroidism    hx 56 years old- oral meds   Iron deficiency anemia    Migraine headache    Pneumonia    x 2 - years ago   PONV (postoperative nausea and vomiting)    PPD positive    Pulmonary nodules    not seen on plain cxr, first detected 1/07 re ct 10/08 pos IPPD > 15 mm 12/09 FOB/lavage 04/20/08 neg afb smear   PVC's (premature ventricular contractions)    Seizure ([place]) 11/15/2020   Sleep apnea    refused CPAP- mouth piece recommended   Tuberculosis    in her late 30's   Past Surgical History:  Past Surgical History:  Procedure Laterality Date   ABDOMINAL HYSTERECTOMY     APPLICATION OF CRANIAL NAVIGATION N/A 11/21/2020   Procedure: APPLICATION OF CRANIAL NAVIGATION;  Surgeon: Judith Part, MD;  Location: [facility];  Service: Neurosurgery;  Laterality: N/A;   BREAST BIOPSY Right 2014   BREAST BIOPSY Left 10/29/2019   CHOLECYSTECTOMY  2014   COLONOSCOPY  05/04/2009   prolapsing external hemorrhoids   CRANIOTOMY Left 11/21/2020   Procedure: Left craniotomy for tumor resection;  Surgeon: Judith Part, MD;  Location: [facility];  Service: Neurosurgery;  Laterality: Left;   ENDOMETRIAL ABLATION     HEMORRHOID SURGERY     x 3   TUBAL LIGATION     Social History:  Social History   Socioeconomic History   Marital status: Divorced    Spouse name: Not on file   Number of children: 2   Years of education: Not on file   Highest education level: Associate degree: occupational, Hotel manager, or vocational program  Occupational History   Occupation: accounts Sport and exercise psychologist: [employer]  Tobacco Use   Smoking status: Never   Smokeless tobacco: Never  Vaping  Use   Vaping Use: Never used  Substance and Sexual Activity   Alcohol use: No    Alcohol/week: 0.0 standard drinks of alcohol   Drug use: No   Sexual activity: Not on file  Other Topics Concern   Not on file  Social History Narrative   Divorced with 2 children   She works as a Librarian, 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Press photographer    Possible TB exposure 2008 (after nodules found)   From [place] lived there and [place]   Never owned birds      Social Determinants of [place] Strain: Medium Risk (06/13/2021)   Overall Financial Resource Strain (CARDIA)    Difficulty of Paying Living Expenses: Somewhat hard  Food Insecurity:  Food Insecurity Present (04/23/2021)   Hunger Vital Sign    Worried About Running Out of Food in the Last Year: Sometimes true    Ran Out of Food in the Last Year: Never true  Transportation Needs: No Transportation Needs (06/13/2021)   PRAPARE - Hydrologist (Medical): No    Lack of Transportation (Non-Medical): No  Physical Activity: Unknown (04/23/2021)   Exercise Vital Sign    Days of Exercise per Week: 0 days    Minutes of Exercise per Session: Not on file  Stress: No Stress Concern Present (04/23/2021)   [place]    Feeling of Stress : Only a little  Social Connections: Moderately Integrated (04/23/2021)   Social Connection and Isolation Panel [NHANES]    Frequency of Communication with Friends and Family: Twice a week    Frequency of Social Gatherings with Friends and Family: Twice a week    Attends Religious Services: More than 4 times per year    Active Member of [employer] or Organizations: Yes    Attends Music therapist: More than 4 times per year    Marital Status: Divorced  Human resources officer Violence: Not on file   Family History:  Family History  Problem Relation Age of Onset   Diabetes type II Mother    Kidney failure Sister    Diabetes Mellitus II  Sister    Colon cancer Maternal Aunt        aunts and uncles   Diabetes Mellitus II Maternal Aunt    Pancreatic cancer Maternal Aunt    Colon cancer Maternal Uncle    Diabetes Mellitus II Maternal Uncle    Heart disease Maternal Grandmother        aunts, uncles, sister   Diabetes Mellitus II Maternal Grandmother    Lung cancer Other        aunts and uncles    Review of Systems: Constitutional: poor appetite Eyes: blurriness of vision Ears, nose, mouth, throat, and face: Doesn't report sore throat Respiratory: Doesn't report cough, dyspnea or wheezes Cardiovascular: Doesn't report palpitation, chest discomfort  Gastrointestinal:  Doesn't report nausea, constipation, diarrhea GU: Doesn't report incontinence Skin: Doesn't report skin rashes Neurological: Per HPI Musculoskeletal: bilateral shoulder pain Behavioral/Psych: +anxiety  Physical Exam: Vitals:   12/25/21 1440  BP: (!) 140/90  Pulse: 78  Resp: 14  Temp: (!) 97.5 F (36.4 C)  SpO2: 100%   KPS: 70. General: Alert, cooperative, pleasant, in no acute distress Head: Normal EENT: No conjunctival injection or scleral icterus.  Lungs: Resp effort normal Cardiac: Regular rate Abdomen: Non-distended abdomen Skin: No rashes cyanosis or petechiae. Extremities: No clubbing or edema  Neurologic Exam: Mental Status: Awake, alert, attentive to examiner. Oriented to self and environment. Language is notable for modest impairment in fluency.  Impaired short term recall. Cranial Nerves: Visual acuity is grossly normal. Visual fields are full. Extra-ocular movements intact. No ptosis. Face L LMN paresis, chronic. Motor: Tone and bulk are normal. Power is full in both arms and legs. Reflexes are symmetric, no pathologic reflexes present.  Sensory: Intact to light touch Gait: Normal.   Labs: I have reviewed the data as listed    Component Value Date/Time   NA 141 12/25/2021 1421   K 3.3 (L) 12/25/2021 1421   CL 107  12/25/2021 1421   CO2 29 12/25/2021 1421   GLUCOSE 95 12/25/2021 1421   BUN 14 12/25/2021 1421  CREATININE 0.89 12/25/2021 1421   CREATININE 0.92 01/25/2020 1424   CALCIUM 10.1 12/25/2021 1421   PROT 6.8 12/25/2021 1421   ALBUMIN 4.5 12/25/2021 1421   AST 19 12/25/2021 1421   ALT 23 12/25/2021 1421   ALKPHOS 106 12/25/2021 1421   BILITOT 2.6 (H) 12/25/2021 1421   GFRNONAA >60 12/25/2021 1421   GFRNONAA 72 01/25/2020 1424   GFRAA 83 01/25/2020 1424   Lab Results  Component Value Date   WBC 1.5 (L) 12/25/2021   NEUTROABS 0.5 (L) 12/25/2021   HGB 10.6 (L) 12/25/2021   HCT 29.3 (L) 12/25/2021   MCV 91.3 12/25/2021   PLT 23 (L) 12/25/2021     Assessment/Plan 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[place]) [C71.9]  [name] is clinically stable [name], now having completed of cycle #2 CCNU/avastin.  Labs [name] unfortunately demonstrate further thrombocytopenia, neutropenia.  We will defer CCNU and avastin [name] given ctyopenias.  Because platelets >20 without symptomatic bleeding, will also defer transfusion. Xarxio injection will be administered [name] instead.  Chemotherapy should be held for the following:  ANC less than 1,000  Platelets less than 100,000  LFT or creatinine greater than 2x ULN  If clinical concerns/contraindications develop  Avastin should be held for the following:  ANC less than 500  Platelets less than 50,000  LFT or creatinine greater than 2x ULN  If clinical concerns/contraindications develop  Total bilirubin is elevated but improved from last week.  This may be attributed to chemotherapy + recent GI viral illness.    MRI brain is scheduled for this Friday.  She should return again in 1 week for CBC, CMP, MRI review, and planned avastin.  For seizures, will increase Vimpat to 100/50 and con't Keppra 1544m BID.    Will con't baclofen 567mTID PRN.  She may dose Tylenol or Tramadol PRN for joint pain or severe  headaches.  We ask that TiROSIELEE CORPORANeturn to clinic in 1 weeks for labs and potential avastin, pending repeat cbc.  MRI scan scheduled for 12/28/21.  All questions were answered. The patient knows to call the clinic with any problems, questions or concerns. No barriers to learning were detected.  The total time spent in the encounter was 40 minutes and more than 50% was on counseling and review of test results   ZaVentura SellersMD Medical Director of Neuro-Oncology [facility]8/22/23 2:44 PM

## 2021-12-28 ENCOUNTER — Ambulatory Visit (HOSPITAL_COMMUNITY)
Admission: RE | Admit: 2021-12-28 | Discharge: 2021-12-28 | Disposition: A | Discharge status: Home / Self Care | Payer: BC Managed Care – PPO | Source: Ambulatory Visit | Attending: Internal Medicine | Admitting: Internal Medicine

## 2021-12-28 DIAGNOSIS — C719 Malignant neoplasm of brain, unspecified: Secondary | ICD-10-CM | POA: Diagnosis not present

## 2021-12-28 MED ORDER — GADOBUTROL 1 MMOL/ML IV SOLN
7.0000 mL | Freq: Once | INTRAVENOUS | Status: AC | PRN
  Administered 2021-12-28: 7 mL via INTRAVENOUS

## 2021-12-29 ENCOUNTER — Other Ambulatory Visit: Payer: Self-pay | Admitting: Adult Health

## 2021-12-29 DIAGNOSIS — E039 Hypothyroidism, unspecified: Secondary | ICD-10-CM

## 2021-12-30 ENCOUNTER — Other Ambulatory Visit: Payer: Self-pay | Admitting: Internal Medicine

## 2021-12-30 DIAGNOSIS — I10 Essential (primary) hypertension: Secondary | ICD-10-CM

## 2021-12-31 ENCOUNTER — Inpatient Hospital Stay: Payer: BC Managed Care – PPO

## 2022-01-01 ENCOUNTER — Other Ambulatory Visit: Payer: Self-pay

## 2022-01-01 ENCOUNTER — Inpatient Hospital Stay: Payer: BC Managed Care – PPO

## 2022-01-01 ENCOUNTER — Inpatient Hospital Stay (HOSPITAL_BASED_OUTPATIENT_CLINIC_OR_DEPARTMENT_OTHER): Payer: BC Managed Care – PPO | Admitting: Internal Medicine

## 2022-01-01 VITALS — BP 136/87 | HR 69

## 2022-01-01 VITALS — BP 138/90 | HR 89 | Temp 97.5°F | Resp 15 | Wt 161.5 lb

## 2022-01-01 DIAGNOSIS — D701 Agranulocytosis secondary to cancer chemotherapy: Secondary | ICD-10-CM

## 2022-01-01 DIAGNOSIS — R569 Unspecified convulsions: Secondary | ICD-10-CM

## 2022-01-01 DIAGNOSIS — Z801 Family history of malignant neoplasm of trachea, bronchus and lung: Secondary | ICD-10-CM | POA: Diagnosis not present

## 2022-01-01 DIAGNOSIS — C719 Malignant neoplasm of brain, unspecified: Secondary | ICD-10-CM | POA: Diagnosis not present

## 2022-01-01 DIAGNOSIS — Z90710 Acquired absence of both cervix and uterus: Secondary | ICD-10-CM | POA: Diagnosis not present

## 2022-01-01 DIAGNOSIS — I10 Essential (primary) hypertension: Secondary | ICD-10-CM | POA: Diagnosis not present

## 2022-01-01 DIAGNOSIS — D6959 Other secondary thrombocytopenia: Secondary | ICD-10-CM | POA: Diagnosis not present

## 2022-01-01 DIAGNOSIS — Z5189 Encounter for other specified aftercare: Secondary | ICD-10-CM | POA: Diagnosis not present

## 2022-01-01 DIAGNOSIS — R252 Cramp and spasm: Secondary | ICD-10-CM | POA: Diagnosis not present

## 2022-01-01 DIAGNOSIS — Z800 Family history of malignant neoplasm of digestive organs: Secondary | ICD-10-CM | POA: Diagnosis not present

## 2022-01-01 DIAGNOSIS — Z5111 Encounter for antineoplastic chemotherapy: Secondary | ICD-10-CM | POA: Diagnosis not present

## 2022-01-01 DIAGNOSIS — C712 Malignant neoplasm of temporal lobe: Secondary | ICD-10-CM | POA: Diagnosis not present

## 2022-01-01 DIAGNOSIS — Z79899 Other long term (current) drug therapy: Secondary | ICD-10-CM | POA: Diagnosis not present

## 2022-01-01 DIAGNOSIS — T451X5A Adverse effect of antineoplastic and immunosuppressive drugs, initial encounter: Secondary | ICD-10-CM | POA: Diagnosis not present

## 2022-01-01 LAB — CBC WITH DIFFERENTIAL (CANCER CENTER ONLY)
Abs Immature Granulocytes: 0 10*3/uL (ref 0.00–0.07)
Basophils Absolute: 0 10*3/uL (ref 0.0–0.1)
Basophils Relative: 0 %
Eosinophils Absolute: 0 10*3/uL (ref 0.0–0.5)
Eosinophils Relative: 2 %
HCT: 28.9 % — ABNORMAL LOW (ref 36.0–46.0)
Hemoglobin: 10.3 g/dL — ABNORMAL LOW (ref 12.0–15.0)
Immature Granulocytes: 0 %
Lymphocytes Relative: 60 %
Lymphs Abs: 0.8 10*3/uL (ref 0.7–4.0)
MCH: 32.9 pg (ref 26.0–34.0)
MCHC: 35.6 g/dL (ref 30.0–36.0)
MCV: 92.3 fL (ref 80.0–100.0)
Monocytes Absolute: 0.2 10*3/uL (ref 0.1–1.0)
Monocytes Relative: 12 %
Neutro Abs: 0.4 10*3/uL — CL (ref 1.7–7.7)
Neutrophils Relative %: 26 %
Platelet Count: 61 10*3/uL — ABNORMAL LOW (ref 150–400)
RBC: 3.13 MIL/uL — ABNORMAL LOW (ref 3.87–5.11)
RDW: 13.2 % (ref 11.5–15.5)
Smear Review: NORMAL
WBC Count: 1.3 10*3/uL — ABNORMAL LOW (ref 4.0–10.5)
nRBC: 0 % (ref 0.0–0.2)

## 2022-01-01 LAB — CMP (CANCER CENTER ONLY)
ALT: 12 U/L (ref 0–44)
AST: 12 U/L — ABNORMAL LOW (ref 15–41)
Albumin: 4.5 g/dL (ref 3.5–5.0)
Alkaline Phosphatase: 155 U/L — ABNORMAL HIGH (ref 38–126)
Anion gap: 7 (ref 5–15)
BUN: 9 mg/dL (ref 6–20)
CO2: 27 mmol/L (ref 22–32)
Calcium: 10.2 mg/dL (ref 8.9–10.3)
Chloride: 106 mmol/L (ref 98–111)
Creatinine: 0.78 mg/dL (ref 0.44–1.00)
GFR, Estimated: >60 mL/min (ref >60)
Glucose, Bld: 94 mg/dL (ref 70–99)
Potassium: 3.3 mmol/L — ABNORMAL LOW (ref 3.5–5.1)
Sodium: 140 mmol/L (ref 135–145)
Total Bilirubin: 1.7 mg/dL — ABNORMAL HIGH (ref 0.3–1.2)
Total Protein: 7.1 g/dL (ref 6.5–8.1)

## 2022-01-01 MED ORDER — FILGRASTIM-SNDZ 300 MCG/0.5ML IJ SOSY
300.0000 ug | PREFILLED_SYRINGE | Freq: Once | INTRAMUSCULAR | Status: AC
  Administered 2022-01-01: 300 ug via SUBCUTANEOUS
  Filled 2022-01-01: qty 0.5

## 2022-01-01 MED ORDER — SODIUM CHLORIDE 0.9 % IV SOLN
10.0000 mg/kg | Freq: Once | INTRAVENOUS | Status: AC
  Administered 2022-01-01: 800 mg via INTRAVENOUS
  Filled 2022-01-01: qty 32

## 2022-01-01 MED ORDER — SODIUM CHLORIDE 0.9 % IV SOLN: Freq: Once | INTRAVENOUS | Status: AC

## 2022-01-01 NOTE — Progress Notes (Signed)
[facility] [place], [place] [address] [phone]   Interval Evaluation  Date of Service: 01/01/22 Patient Name: [name] Patient MRN: [id number] Patient DOB: [birth year] Provider: Ventura Sellers, MD  Identifying Statement:  [name] is a 56 y.o. female with left temporal glioblastoma   Oncologic History: Oncology History  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[facility])  11/21/2020 Initial Diagnosis   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[facility])   11/21/2020 Cancer Staging   Staging form: Brain and Spinal Cord, AJCC 8th Edition - Pathologic stage from 11/21/2020: WHO Grade IV - Signed by Ventura Sellers, MD on 11/30/2020 Histopathologic type: Glioblastoma Stage prefix: Initial diagnosis Histologic grading system: 4 grade system Extent of surgical resection: Subtotal resection Karnofsky performance status: Score 90 Seizures at presentation: Present Duration of symptoms before diagnosis: Short IDH1 mutation: Negative   12/27/2020 - 02/07/2021 Radiation Therapy   IMRT and concurrent Temodar Isidore Moos)   03/12/2021 - 08/26/2021 Chemotherapy   Completes 5 cycles of adjuvant 5-day Temodar   08/27/2021 Progression   POD #1   09/06/2021 -  Chemotherapy   Initiates second line therapy; CCNU 13m/m2 PO q6 weeks and avastin 185mkg IV q2 weeks     Biomarkers:  MGMT Methylated.  IDH 1/2 Wild type.  EGFR Unknown  TERT Unknown   Interval History: TiBAYA LENTZresents today following MRI brain.  No new or progressive complaints, no further seizures since increasing the vimpat.  No further bruising in arms or legs.  Short term memory impairment and fatigue are still persistent.       H+P (11/25/20) Patient presented to medical attention this past month with first ever seizure.  She describes episode of sudden onset "disconnection" with tongue biting and urination, followed by  period of confusion.  CNS imaging demonstrated enhancing mass within left anterior temporal lobe, which was mostly resected by Dr. [name] 11/21/20.  Since surgery she had not had recurrence of seizures.  She complains of painful cramping of her right leg, which started ~1 week prior.  At this time the cramping is her main complaint.  Otherwise fully functional, independent, would like to return to work if possible.  Medications: Current Outpatient Medications on File Prior to Visit  Medication Sig Dispense Refill   amitriptyline (ELAVIL) 50 MG tablet TAKE 1 TABLET BY MOUTH EVERYDAY AT BEDTIME 90 tablet 2   amLODipine (NORVASC) 10 MG tablet TAKE 1 TABLET BY MOUTH EVERY DAY 90 tablet 1   baclofen 5 MG TABS Take 5 mg by mouth 3 (three) times daily as needed for muscle spasms. 60 tablet 1   CVS D3 125 MCG (5000 UT) capsule TAKE 1 CAPSULE (5,000 UNITS TOTAL) BY MOUTH DAILY. 90 capsule 3   GLEOSTINE 100 MG capsule Take by mouth. (Patient not taking: Reported on 12/25/2021)     GLEOSTINE 40 MG capsule Take by mouth. (Patient not taking: Reported on 12/25/2021)     lacosamide (VIMPAT) 50 MG TABS tablet Take 2 tablets (100 mg total) by mouth every morning AND 1 tablet (50 mg total) at bedtime. 90 tablet 3   levETIRAcetam (KEPPRA) 750 MG tablet Take 2 tablets (1,500 mg total) by mouth 2 (two) times daily. 360 tablet 1   levothyroxine (SYNTHROID) 100 MCG tablet TAKE 1 TABLET BY MOUTH EVERY DAY 90 tablet 3   LORazepam (ATIVAN) 2 MG tablet Take 1 tablet (2 mg total) by mouth every 8 (eight) hours  as needed for seizure. 12 tablet 0   ondansetron (ZOFRAN-ODT) 4 MG disintegrating tablet Take 1 tablet (4 mg total) by mouth every 8 (eight) hours as needed for nausea or vomiting. (Patient not taking: Reported on 12/25/2021) 10 tablet 0   Potassium Chloride ER 20 MEQ TBCR TAKE 1 TABLET BY MOUTH EVERY DAY 90 tablet 3   No current facility-administered medications on file prior to visit.    Allergies: Not on  File Past Medical History:  Past Medical History:  Diagnosis Date   Bell's palsy    left side of face- notied in smile and eyes, if very tired   Brain cancer ([facility])    Complication of anesthesia    N/V   Essential hypertension    External hemorrhoids    History of blood transfusion    post op Hemorroids   History of pneumonia    Hyperlipidemia    Hyperthyroidism    hx 56 years old- oral meds   Iron deficiency anemia    Migraine headache    Pneumonia    x 2 - years ago   PONV (postoperative nausea and vomiting)    PPD positive    Pulmonary nodules    not seen on plain cxr, first detected 1/07 re ct 10/08 pos IPPD > 15 mm 12/09 FOB/lavage 04/20/08 neg afb smear   PVC's (premature ventricular contractions)    Seizure ([facility]) 11/15/2020   Sleep apnea    refused CPAP- mouth piece recommended   Tuberculosis    in her late 30's   Past Surgical History:  Past Surgical History:  Procedure Laterality Date   ABDOMINAL HYSTERECTOMY     APPLICATION OF CRANIAL NAVIGATION N/A 11/21/2020   Procedure: APPLICATION OF CRANIAL NAVIGATION;  Surgeon: Judith Part, MD;  Location: [facility];  Service: Neurosurgery;  Laterality: N/A;   BREAST BIOPSY Right 2014   BREAST BIOPSY Left 10/29/2019   CHOLECYSTECTOMY  2014   COLONOSCOPY  05/04/2009   prolapsing external hemorrhoids   CRANIOTOMY Left 11/21/2020   Procedure: Left craniotomy for tumor resection;  Surgeon: Judith Part, MD;  Location: [facility];  Service: Neurosurgery;  Laterality: Left;   ENDOMETRIAL ABLATION     HEMORRHOID SURGERY     x 3   TUBAL LIGATION     Social History:  Social History   Socioeconomic History   Marital status: Divorced    Spouse name: Not on file   Number of children: 2   Years of education: Not on file   Highest education level: Associate degree: occupational, Hotel manager, or vocational program  Occupational History   Occupation: accounts Sport and exercise psychologist: [employer]  Tobacco Use   Smoking status:  Never   Smokeless tobacco: Never  Vaping Use   Vaping Use: Never used  Substance and Sexual Activity   Alcohol use: No    Alcohol/week: 0.0 standard drinks of alcohol   Drug use: No   Sexual activity: Not on file  Other Topics Concern   Not on file  Social History Narrative   Divorced with 2 children   She works as a Librarian, 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Press photographer    Possible TB exposure 2008 (after nodules found)   From [place] lived there and [place]   Never owned birds      Social Determinants of Dean Strain: Medium Risk (06/13/2021)   Overall Financial Resource Strain (CARDIA)    Difficulty of Paying Living Expenses: Somewhat hard  Food Insecurity: Food  Insecurity Present (04/23/2021)   Hunger Vital Sign    Worried About Running Out of Food in the Last Year: Sometimes true    Ran Out of Food in the Last Year: Never true  Transportation Needs: No Transportation Needs (06/13/2021)   PRAPARE - Hydrologist (Medical): No    Lack of Transportation (Non-Medical): No  Physical Activity: Unknown (04/23/2021)   Exercise Vital Sign    Days of Exercise per Week: 0 days    Minutes of Exercise per Session: Not on file  Stress: No Stress Concern Present (04/23/2021)   [place]    Feeling of Stress : Only a little  Social Connections: Moderately Integrated (04/23/2021)   Social Connection and Isolation Panel [NHANES]    Frequency of Communication with Friends and Family: Twice a week    Frequency of Social Gatherings with Friends and Family: Twice a week    Attends Religious Services: More than 4 times per year    Active Member of [employer] or Organizations: Yes    Attends Music therapist: More than 4 times per year    Marital Status: Divorced  Human resources officer Violence: Not on file   Family History:  Family History  Problem Relation Age of Onset   Diabetes type II Mother    Kidney  failure Sister    Diabetes Mellitus II Sister    Colon cancer Maternal Aunt        aunts and uncles   Diabetes Mellitus II Maternal Aunt    Pancreatic cancer Maternal Aunt    Colon cancer Maternal Uncle    Diabetes Mellitus II Maternal Uncle    Heart disease Maternal Grandmother        aunts, uncles, sister   Diabetes Mellitus II Maternal Grandmother    Lung cancer Other        aunts and uncles    Review of Systems: Constitutional: poor appetite Eyes: blurriness of vision Ears, nose, mouth, throat, and face: Doesn't report sore throat Respiratory: Doesn't report cough, dyspnea or wheezes Cardiovascular: Doesn't report palpitation, chest discomfort  Gastrointestinal:  Doesn't report nausea, constipation, diarrhea GU: Doesn't report incontinence Skin: Doesn't report skin rashes Neurological: Per HPI Musculoskeletal: bilateral shoulder pain Behavioral/Psych: +anxiety  Physical Exam: Vitals:   01/01/22 1437  BP: (!) 138/90  Pulse: 89  Resp: 15  Temp: (!) 97.5 F (36.4 C)  SpO2: 99%   KPS: 70. General: Alert, cooperative, pleasant, in no acute distress Head: Normal EENT: No conjunctival injection or scleral icterus.  Lungs: Resp effort normal Cardiac: Regular rate Abdomen: Non-distended abdomen Skin: No rashes cyanosis or petechiae. Extremities: No clubbing or edema  Neurologic Exam: Mental Status: Awake, alert, attentive to examiner. Oriented to self and environment. Language is notable for modest impairment in fluency.  Impaired short term recall. Cranial Nerves: Visual acuity is grossly normal. Visual fields are full. Extra-ocular movements intact. No ptosis. Face L LMN paresis, chronic. Motor: Tone and bulk are normal. Power is full in both arms and legs. Reflexes are symmetric, no pathologic reflexes present.  Sensory: Intact to light touch Gait: Normal.   Labs: I have reviewed the data as listed    Component Value Date/Time   NA 141 12/25/2021 1421   K  3.3 (L) 12/25/2021 1421   CL 107 12/25/2021 1421   CO2 29 12/25/2021 1421   GLUCOSE 95 12/25/2021 1421   BUN 14 12/25/2021 1421   CREATININE  0.89 12/25/2021 1421   CREATININE 0.92 01/25/2020 1424   CALCIUM 10.1 12/25/2021 1421   PROT 6.8 12/25/2021 1421   ALBUMIN 4.5 12/25/2021 1421   AST 19 12/25/2021 1421   ALT 23 12/25/2021 1421   ALKPHOS 106 12/25/2021 1421   BILITOT 2.6 (H) 12/25/2021 1421   GFRNONAA >60 12/25/2021 1421   GFRNONAA 72 01/25/2020 1424   GFRAA 83 01/25/2020 1424   Lab Results  Component Value Date   WBC 1.3 (L) 01/01/2022   NEUTROABS PENDING 01/01/2022   HGB 10.3 (L) 01/01/2022   HCT 28.9 (L) 01/01/2022   MCV 92.3 01/01/2022   PLT 61 (L) 01/01/2022    Imaging:  [facility] Clinician Interpretation: I have personally reviewed the CNS images as listed.  My interpretation, in the context of the patient's clinical presentation, is progressive disease  MR BRAIN W WO CONTRAST  Result Date: 12/28/2021 CLINICAL DATA:  Glioblastoma follow-up EXAM: MRI HEAD WITHOUT AND WITH CONTRAST TECHNIQUE: Multiplanar, multiecho pulse sequences of the brain and surrounding structures were obtained without and with intravenous contrast. CONTRAST:  4m GADAVIST GADOBUTROL 1 MMOL/ML IV SOLN COMPARISON:  10/15/2021 FINDINGS: Brain: A new small area of restricted diffusion, T2 hypointensity, and faint enhancement in the left periatrial and parietal white matter. The largest discrete area is less than 1 cm in size in the parietal white matter. The relatively faint enhancement is likely a function of Avastin use. The enhancement is similar to areas of restricted diffusion and patchy enhancement below the left frontal horn of the lateral ventricle in the deep left temporal lobe, which are stable. No complicating infarct. No increase in T2 signal or swelling. No shift, hydrocephalus, or collection. Vascular: Major flow voids and vascular enhancements are preserved Skull and upper cervical spine: Normal  marrow signal Sinuses/Orbits: Negative IMPRESSION: Small area of progression in the left periatrial and parietal white matter. Electronically Signed   By: JJorje GuildM.D.   On: 12/28/2021 17:20     Assessment/Plan 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[facility] [C71.9]  Haelee MZALAYAH PIZZUTOis clinically stable today.  MRI brain demonstrates mixed response, with improvement in burden of enhancement in insular and frontal lesions.  There is a novel focus of enhancement and DWI signal abnormality within left parietal lobe, also with T2 correlate.  This is suspicious for progressive disease.  Labs today unfortunately demonstrate further neutropenia, platelets somewhat improved.    For progressive glioblastoma, we will discontinue CCNU and transition to CPT-11 1265mm2 IV q2 weeks.  We discussed side effects including acute and delayed diarrhea.  Avastin will con't as prior at 1064mg IV q2 weeks.  She is agreeable with this transition, and understands that CPT-11 will be held until cytopenias improve.  Avastin can be given today despite low ANC.  Zarxio will be given today as well for bone marrow support.    Chemotherapy should be held for the following:  ANC less than 1,000  Platelets less than 100,000  LFT or creatinine greater than 2x ULN  If clinical concerns/contraindications develop  Avastin should be held for the following:  ANC less than 500  Platelets less than 50,000  LFT or creatinine greater than 2x ULN  If clinical concerns/contraindications develop  For seizures, will con't Vimpat 100/50 and con't Keppra 1500m64mD.    Will con't baclofen 5mg 3m PRN.  She may dose Tylenol or Tramadol PRN for joint pain or severe headaches.  We ask that TijuaMAKI HEGErn to clinic in 2 weeks  for CPT-11, Avastin and labs.  All questions were answered. The patient knows to call the clinic with any problems, questions or concerns. No barriers to learning were  detected.  The total time spent in the encounter was 40 minutes and more than 50% was on counseling and review of test results   Ventura Sellers, MD Medical Director of Neuro-Oncology [facility] 01/01/22 2:51 PM

## 2022-01-01 NOTE — Patient Instructions (Signed)
[facility]COL[facility]  Discharge Instructions: Thank you for choosing [facility] to provide your oncology and hematology care.   If you have a lab appointment with the [facility], please go directly to the [facility] and check in at the registration area.   Wear comfortable clothing and clothing appropriate for easy access to any Portacath or PICC line.   We strive to give you quality time with your provider. You may need to reschedule your appointment if you arrive late (15 or more minutes).  Arriving late affects you and other patients whose appointments are after yours.  Also, if you miss three or more appointments without notifying the office, you may be dismissed from the clinic at the provider's discretion.      For prescription refill requests, have your pharmacy contact our office and allow 72 hours for refills to be completed.    Today you received the following chemotherapy and/or immunotherapy agents: Avastin      To help prevent nausea and vomiting after your treatment, we encourage you to take your nausea medication as directed.  BELOW ARE SYMPTOMS THAT SHOULD BE REPORTED IMMEDIATELY: *FEVER GREATER THAN 100.4 F (38 C) OR HIGHER *CHILLS OR SWEATING *NAUSEA AND VOMITING THAT IS NOT CONTROLLED WITH YOUR NAUSEA MEDICATION *UNUSUAL SHORTNESS OF BREATH *UNUSUAL BRUISING OR BLEEDING *URINARY PROBLEMS (pain or burning when urinating, or frequent urination) *BOWEL PROBLEMS (unusual diarrhea, constipation, pain near the anus) TENDERNESS IN MOUTH AND THROAT WITH OR WITHOUT PRESENCE OF ULCERS (sore throat, sores in mouth, or a toothache) UNUSUAL RASH, SWELLING OR PAIN  UNUSUAL VAGINAL DISCHARGE OR ITCHING   Items with * indicate a potential emergency and should be followed up as soon as possible or go to the Emergency Department if any problems should occur.  Please show the CHEMOTHERAPY ALERT CARD or IMMUNOTHERAPY ALERT CARD at check-in to  the Emergency Department and triage nurse.  Should you have questions after your visit or need to cancel or reschedule your appointment, please contact [facility]  Dept: [phone]  and follow the prompts.  Office hours are 8:00 a.m. to 4:30 p.m. Monday - Friday. Please note that voicemails left after 4:00 p.m. may not be returned until the following business day.  We are closed weekends and major holidays. You have access to a nurse at all times for urgent questions. Please call the main number to the clinic Dept: [phone] and follow the prompts.   For any non-urgent questions, you may also contact your provider using MyChart. We now offer e-Visits for anyone 9 and older to request care online for non-urgent symptoms. For details visit [email or web address].   Also download the MyChart app! Go to the app store, search "MyChart", open the app, select [facility], and log in with your MyChart username and password.  Masks are optional in the cancer centers. If you would like for your care team to wear a mask while they are taking care of you, please let them know. You may have one support person who is at least 54 years old accompany you for your appointments.

## 2022-01-01 NOTE — Progress Notes (Signed)
Per Dr Mickeal Skinner approved Avastin treatment today 01/01/2022 with lab values and vital signs.  Will give Zarxio in addition to Avastin.

## 2022-01-02 ENCOUNTER — Telehealth: Payer: Self-pay | Admitting: Internal Medicine

## 2022-01-02 NOTE — Telephone Encounter (Signed)
Per 8/29 los called and spoke to pt about appointment

## 2022-01-03 ENCOUNTER — Inpatient Hospital Stay: Payer: BC Managed Care – PPO | Admitting: Licensed Clinical Social Worker

## 2022-01-03 DIAGNOSIS — C719 Malignant neoplasm of brain, unspecified: Secondary | ICD-10-CM

## 2022-01-03 NOTE — Progress Notes (Signed)
[facility] CSW Progress Note  Clinical Education officer, museum contacted patient by phone to discuss concerns regarding insurance and returning to work.  CSW inquired about pt's expectations for duration of treatment and conversations w/ the oncologist regarding possible return to work.  Pt states she is expecting to return to the cancer center on 9/12 and will be speaking w/ Dr. Mickeal Skinner regarding the above.  Pt verbalized concerns regarding insurance as well as how to supplement her income as her short term disability will run out in approximately 1 month.  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  As a result of her tenure she has been receiving 100% of her salary on short term disability and her insurance premiums have continued to be covered; however, if she is required to transition to long term disability she will receive only 60% of her salary and will need to pay a percentage of her insurance premiums.  CSW discussed long term planning in the event she is not able to return to work.  Pt may need to apply for SSD and utilize COBRA until Medicare is available to pt.  Pt verbalized concerns about not being able to work until retirement as she has life Engineer, structural through work for which her children are her beneficiaries and it is important to her to be able to leave this benefit to them.  CSW encouraged pt to discuss prognosis and the likelihood of returning to work with her oncologist to better prepare herself for a conversation with her employer.  CSW to follow up w/ pt in 2 weeks following her appointment w/ her oncologist.      Henriette Combs, LCSW

## 2022-01-06 ENCOUNTER — Emergency Department (HOSPITAL_COMMUNITY)
Admission: EM | Admit: 2022-01-06 | Discharge: 2022-01-06 | Disposition: A | Discharge status: Home / Self Care | Payer: BC Managed Care – PPO | Attending: Emergency Medicine | Admitting: Emergency Medicine

## 2022-01-06 ENCOUNTER — Encounter (HOSPITAL_COMMUNITY): Payer: Self-pay | Admitting: Emergency Medicine

## 2022-01-06 ENCOUNTER — Other Ambulatory Visit: Payer: Self-pay

## 2022-01-06 DIAGNOSIS — L0390 Cellulitis, unspecified: Secondary | ICD-10-CM

## 2022-01-06 DIAGNOSIS — Z79899 Other long term (current) drug therapy: Secondary | ICD-10-CM | POA: Insufficient documentation

## 2022-01-06 DIAGNOSIS — M79662 Pain in left lower leg: Secondary | ICD-10-CM | POA: Diagnosis not present

## 2022-01-06 DIAGNOSIS — L03116 Cellulitis of left lower limb: Secondary | ICD-10-CM | POA: Diagnosis not present

## 2022-01-06 MED ORDER — CEPHALEXIN 500 MG PO CAPS: 1000.0000 mg | ORAL_CAPSULE | Freq: Two times a day (BID) | ORAL | 0 refills | Status: AC

## 2022-01-06 MED ORDER — DOXYCYCLINE HYCLATE 100 MG PO CAPS: 100.0000 mg | ORAL_CAPSULE | Freq: Two times a day (BID) | ORAL | 0 refills | Status: DC

## 2022-01-06 NOTE — ED Provider Notes (Signed)
[facility] DEPT Provider Note   CSN: [id number] Arrival date & time: 01/06/22  1439     History  Chief Complaint  Patient presents with   Insect Bite    Angelene MOZELL HABER is a 56 y.o. female.  56 year old female presents with tender red area to her left lower extremity times over a week.  Thinks that she may have been bit by an insect.  Denies any fever or chills.  No erythema going up her leg.  Notes some slight pruritus.  No other lesions on her body.  No treatment use prior to arrival       Home Medications Prior to Admission medications   Medication Sig Start Date End Date Taking? Authorizing Provider  amitriptyline (ELAVIL) 50 MG tablet TAKE 1 TABLET BY MOUTH EVERYDAY AT BEDTIME 06/21/21   Vaslow, Acey Lav, MD  amLODipine (NORVASC) 10 MG tablet TAKE 1 TABLET BY MOUTH EVERY DAY 12/30/21   Vaslow, Acey Lav, MD  baclofen 5 MG TABS Take 5 mg by mouth 3 (three) times daily as needed for muscle spasms. 05/29/21   Vaslow, Acey Lav, MD  CVS D3 125 MCG (5000 UT) capsule TAKE 1 CAPSULE (5,000 UNITS TOTAL) BY MOUTH DAILY. 01/26/21   Nafziger, [name], NP  GLEOSTINE 100 MG capsule Take by mouth. Patient not taking: Reported on 12/25/2021 11/09/21   [provider]  GLEOSTINE 40 MG capsule Take by mouth. Patient not taking: Reported on 12/25/2021 11/09/21   [provider]  lacosamide (VIMPAT) 50 MG TABS tablet Take 2 tablets (100 mg total) by mouth every morning AND 1 tablet (50 mg total) at bedtime. 12/25/21   Ventura Sellers, MD  levETIRAcetam (KEPPRA) 750 MG tablet Take 2 tablets (1,500 mg total) by mouth 2 (two) times daily. 10/16/21   Vaslow, Acey Lav, MD  levothyroxine (SYNTHROID) 100 MCG tablet TAKE 1 TABLET BY MOUTH EVERY DAY 02/06/21   Nafziger, [name], NP  LORazepam (ATIVAN) 2 MG tablet Take 1 tablet (2 mg total) by mouth every 8 (eight) hours as needed for seizure. 12/25/21   Vaslow, Acey Lav, MD  ondansetron (ZOFRAN-ODT) 4 MG  disintegrating tablet Take 1 tablet (4 mg total) by mouth every 8 (eight) hours as needed for nausea or vomiting. Patient not taking: Reported on 12/25/2021 12/17/21   Barrie Folk, PA-C  Potassium Chloride ER 20 MEQ TBCR TAKE 1 TABLET BY MOUTH EVERY DAY 07/13/21   [name], NP      Allergies    Patient has no known allergies.    Review of Systems   Review of Systems  All other systems reviewed and are negative.   Physical Exam Updated Vital Signs BP (!) 138/94 (BP Location: Left Arm)   Pulse 96   Temp 98 F (36.7 C) (Oral)   Resp 16   SpO2 100%  Physical Exam Vitals and nursing note reviewed.  Constitutional:      General: She is not in acute distress.    Appearance: Normal appearance. She is well-developed. She is not toxic-appearing.  HENT:     Head: Normocephalic and atraumatic.  Eyes:     General: Lids are normal.     Conjunctiva/sclera: Conjunctivae normal.     Pupils: Pupils are equal, round, and reactive to light.  Neck:     Thyroid: No thyroid mass.     Trachea: No tracheal deviation.  Cardiovascular:     Rate and Rhythm: Normal rate and regular rhythm.  Heart sounds: Normal heart sounds. No murmur heard.    No gallop.  Pulmonary:     Effort: Pulmonary effort is normal. No respiratory distress.     Breath sounds: Normal breath sounds. No stridor. No decreased breath sounds, wheezing, rhonchi or rales.  Abdominal:     General: There is no distension.     Palpations: Abdomen is soft.     Tenderness: There is no abdominal tenderness. There is no rebound.  Musculoskeletal:        General: No tenderness. Normal range of motion.     Cervical back: Normal range of motion and neck supple.       Legs:  Skin:    General: Skin is warm and dry.     Findings: No abrasion or rash.  Neurological:     Mental Status: She is alert and oriented to person, place, and time. Mental status is at baseline.     GCS: GCS eye subscore is 4. GCS verbal subscore is  5. GCS motor subscore is 6.     Cranial Nerves: No cranial nerve deficit.     Sensory: No sensory deficit.     Motor: Motor function is intact.  Psychiatric:        Attention and Perception: Attention normal.        Speech: Speech normal.        Behavior: Behavior normal.     ED Results / Procedures / Treatments   Labs (all labs ordered are listed, but only abnormal results are displayed) Labs Reviewed - No data to display  EKG None  Radiology No results found.  Procedures Procedures    Medications Ordered in ED Medications - No data to display  ED Course/ Medical Decision Making/ A&P                           Medical Decision Making  Patient with likely cellulitis of left lower extremity.  No indication for admission at this time.  Will treat with oral antibiotics.  No concern for sepsis.        Final Clinical Impression(s) / ED Diagnoses Final diagnoses:  None    Rx / DC Orders ED Discharge Orders     None         [place]ac[place]ia Leigh, MD 01/06/22 1528

## 2022-01-06 NOTE — ED Triage Notes (Signed)
Pt reports bite on leg that is painful and swelling. Pt reports she doesn't know what bit her. ,

## 2022-01-11 ENCOUNTER — Ambulatory Visit (INDEPENDENT_AMBULATORY_CARE_PROVIDER_SITE_OTHER): Payer: BC Managed Care – PPO | Admitting: Adult Health

## 2022-01-11 ENCOUNTER — Encounter: Payer: Self-pay | Admitting: Adult Health

## 2022-01-11 VITALS — BP 100/70 | HR 89 | Temp 98.2°F | Ht 68.0 in | Wt 159.0 lb

## 2022-01-11 DIAGNOSIS — L03116 Cellulitis of left lower limb: Secondary | ICD-10-CM | POA: Diagnosis not present

## 2022-01-11 MED ORDER — DOXYCYCLINE HYCLATE 100 MG PO CAPS: 100.0000 mg | ORAL_CAPSULE | Freq: Two times a day (BID) | ORAL | 0 refills | Status: DC

## 2022-01-11 NOTE — Progress Notes (Signed)
Subjective:    Patient ID: [name], female    DOB: March 29, 1966, 56 y.o.   MRN: [id number]  HPI 56 year old female who  has a past medical history of Bell's palsy, Brain cancer ([place]), Complication of anesthesia, Essential hypertension, External hemorrhoids, History of blood transfusion, History of pneumonia, Hyperlipidemia, Hyperthyroidism, Iron deficiency anemia, Migraine headache, Pneumonia, PONV (postoperative nausea and vomiting), PPD positive, Pulmonary nodules, PVC's (premature ventricular contractions), Seizure ([place]) (11/15/2020), Sleep apnea, and Tuberculosis.  She presents to the office today for follow-up after being seen in the emergency room 5 days ago.  The left lower extremity for 1 week.  She thought that she may have been bitten by an insect.  When she was seen in the emergency room she denied fevers or chills.  There was no streaking noted.  Her exam showed an approximately 2.5 cm circular round erythematous area.  There was no active drainage or fluctuance noted.  She was treated for cellulitis with Keflex 1000 mg twice daily x5 days and Doxycycline 100 mg BID x 5 days.   Today she reports that she has one more dose of antibiotics left. She has been vomiting after taking her antibiotics and believes the Keflex is causing the vomiting. She has noticed improvement in the cellulitis but continues to have redness, warmth, and a " hard area"   Review of Systems See HPI   Past Medical History:  Diagnosis Date   Bell's palsy    left side of face- notied in smile and eyes, if very tired   Brain cancer ([place])    Complication of anesthesia    N/V   Essential hypertension    External hemorrhoids    History of blood transfusion    post op Hemorroids   History of pneumonia    Hyperlipidemia    Hyperthyroidism    hx 56 years old- oral meds   Iron deficiency anemia    Migraine headache    Pneumonia    x 2 - years ago   PONV (postoperative nausea and vomiting)    PPD  positive    Pulmonary nodules    not seen on plain cxr, first detected 1/07 re ct 10/08 pos IPPD > 15 mm 12/09 FOB/lavage 04/20/08 neg afb smear   PVC's (premature ventricular contractions)    Seizure ([name]) 11/15/2020   Sleep apnea    refused CPAP- mouth piece recommended   Tuberculosis    in her late 76's    Social History   Socioeconomic History   Marital status: Divorced    Spouse name: Not on file   Number of children: 2   Years of education: Not on file   Highest education level: Associate degree: occupational, Hotel manager, or vocational program  Occupational History   Occupation: accounts Sport and exercise psychologist: [employer]  Tobacco Use   Smoking status: Never   Smokeless tobacco: Never  Vaping Use   Vaping Use: Never used  Substance and Sexual Activity   Alcohol use: No    Alcohol/week: 0.0 standard drinks of alcohol   Drug use: No   Sexual activity: Not on file  Other Topics Concern   Not on file  Social History Narrative   Divorced with 2 children   She works as a Librarian, 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[employer]    Possible TB exposure 2008 (after nodules found)   From [place] lived there and [place]   Never owned birds      Social Determinants of Health  Financial Resource Strain: Medium Risk (06/13/2021)   Overall Financial Resource Strain (CARDIA)    Difficulty of Paying Living Expenses: Somewhat hard  Food Insecurity: Food Insecurity Present (04/23/2021)   Hunger Vital Sign    Worried About Running Out of Food in the Last Year: Sometimes true    Ran Out of Food in the Last Year: Never true  Transportation Needs: No Transportation Needs (06/13/2021)   PRAPARE - Hydrologist (Medical): No    Lack of Transportation (Non-Medical): No  Physical Activity: Unknown (04/23/2021)   Exercise Vital Sign    Days of Exercise per Week: 0 days    Minutes of Exercise per Session: Not on file  Stress: No Stress Concern Present (04/23/2021)   [place]    Feeling of Stress : Only a little  Social Connections: Moderately Integrated (04/23/2021)   Social Connection and Isolation Panel [NHANES]    Frequency of Communication with Friends and Family: Twice a week    Frequency of Social Gatherings with Friends and Family: Twice a week    Attends Religious Services: More than 4 times per year    Active Member of [employer] or Organizations: Yes    Attends Music therapist: More than 4 times per year    Marital Status: Divorced  Human resources officer Violence: Not on file    Past Surgical History:  Procedure Laterality Date   ABDOMINAL HYSTERECTOMY     APPLICATION OF CRANIAL NAVIGATION N/A 11/21/2020   Procedure: [place];  Surgeon: Judith Part, MD;  Location: [facility];  Service: Neurosurgery;  Laterality: N/A;   BREAST BIOPSY Right 2014   BREAST BIOPSY Left 10/29/2019   CHOLECYSTECTOMY  2014   COLONOSCOPY  05/04/2009   prolapsing external hemorrhoids   CRANIOTOMY Left 11/21/2020   Procedure: Left craniotomy for tumor resection;  Surgeon: Judith Part, MD;  Location: [facility];  Service: Neurosurgery;  Laterality: Left;   ENDOMETRIAL ABLATION     HEMORRHOID SURGERY     x 3   TUBAL LIGATION      Family History  Problem Relation Age of Onset   Diabetes type II Mother    Kidney failure Sister    Diabetes Mellitus II Sister    Colon cancer Maternal Aunt        aunts and uncles   Diabetes Mellitus II Maternal Aunt    Pancreatic cancer Maternal Aunt    Colon cancer Maternal Uncle    Diabetes Mellitus II Maternal Uncle    Heart disease Maternal Grandmother        aunts, uncles, sister   Diabetes Mellitus II Maternal Grandmother    Lung cancer Other        aunts and uncles    No Known Allergies  Current Outpatient Medications on File Prior to Visit  Medication Sig Dispense Refill   amitriptyline (ELAVIL) 50 MG tablet TAKE 1 TABLET BY MOUTH EVERYDAY  AT BEDTIME 90 tablet 2   amLODipine (NORVASC) 10 MG tablet TAKE 1 TABLET BY MOUTH EVERY DAY 90 tablet 1   baclofen 5 MG TABS Take 5 mg by mouth 3 (three) times daily as needed for muscle spasms. 60 tablet 1   cephALEXin (KEFLEX) 500 MG capsule Take 2 capsules (1,000 mg total) by mouth 2 (two) times daily for 5 days. 20 capsule 0   [employer]S D3 125 MCG (5000 UT) capsule TAKE 1 CAPSULE (5,000 UNITS  TOTAL) BY MOUTH DAILY. 90 capsule 3   GLEOSTINE 100 MG capsule Take by mouth.     GLEOSTINE 40 MG capsule Take by mouth.     lacosamide (VIMPAT) 50 MG TABS tablet Take 2 tablets (100 mg total) by mouth every morning AND 1 tablet (50 mg total) at bedtime. 90 tablet 3   levETIRAcetam (KEPPRA) 750 MG tablet Take 2 tablets (1,500 mg total) by mouth 2 (two) times daily. 360 tablet 1   levothyroxine (SYNTHROID) 100 MCG tablet TAKE 1 TABLET BY MOUTH EVERY DAY 90 tablet 3   LORazepam (ATIVAN) 2 MG tablet Take 1 tablet (2 mg total) by mouth every 8 (eight) hours as needed for seizure. 12 tablet 0   ondansetron (ZOFRAN-ODT) 4 MG disintegrating tablet Take 1 tablet (4 mg total) by mouth every 8 (eight) hours as needed for nausea or vomiting. 10 tablet 0   Potassium Chloride ER 20 MEQ TBCR TAKE 1 TABLET BY MOUTH EVERY DAY 90 tablet 3   No current facility-administered medications on file prior to visit.    BP 100/70   Pulse 89   Temp 98.2 F (36.8 C) (Oral)   Ht '5\' 8"'$  (1.727 m)   Wt 159 lb (72.1 kg)   SpO2 98%   BMI 24.18 kg/m       Objective:   Physical Exam Vitals and nursing note reviewed.  Constitutional:      Appearance: Normal appearance.  Skin:    General: Skin is warm and dry.     Findings: Erythema present.     Comments: Order sized nonfluctuant area on left lower medial leg.  Localized erythema noted.  Neurological:     General: No focal deficit present.     Mental Status: She is alert and oriented to person, place, and time.  Psychiatric:        Mood and Affect: Mood normal.         Behavior: Behavior normal.        Thought Content: Thought content normal.        Judgment: Judgment normal.       Assessment & Plan:  1. Cellulitis of left lower extremity - Will keep on Doxycycline for an additional 7 days. Advised to take with food. Follow up if not completely resolved by the completion of antibiotics  - doxycycline (VIBRAMYCIN) 100 MG capsule; Take 1 capsule (100 mg total) by mouth 2 (two) times daily.  Dispense: 14 capsule; Refill: 0  [name], NP

## 2022-01-15 ENCOUNTER — Other Ambulatory Visit: Payer: BC Managed Care – PPO

## 2022-01-15 ENCOUNTER — Ambulatory Visit: Payer: BC Managed Care – PPO

## 2022-01-15 ENCOUNTER — Ambulatory Visit: Payer: BC Managed Care – PPO | Admitting: Internal Medicine

## 2022-01-15 NOTE — Progress Notes (Signed)
The following biosimilar Mvasi (bevacizumab-awwb) has been selected for use in this patient.  Kennith Center, Pharm.D., CPP 01/15/2022'@11'$ :08 AM

## 2022-01-16 MED FILL — Dexamethasone Sodium Phosphate Inj 100 MG/10ML: INTRAMUSCULAR | Qty: 1 | Status: AC

## 2022-01-17 ENCOUNTER — Inpatient Hospital Stay: Payer: BC Managed Care – PPO

## 2022-01-17 ENCOUNTER — Telehealth: Payer: Self-pay | Admitting: *Deleted

## 2022-01-17 ENCOUNTER — Inpatient Hospital Stay (HOSPITAL_BASED_OUTPATIENT_CLINIC_OR_DEPARTMENT_OTHER): Payer: BC Managed Care – PPO | Admitting: Internal Medicine

## 2022-01-17 ENCOUNTER — Other Ambulatory Visit: Payer: Self-pay

## 2022-01-17 ENCOUNTER — Inpatient Hospital Stay: Payer: BC Managed Care – PPO | Attending: Internal Medicine

## 2022-01-17 VITALS — BP 133/83 | HR 62 | Resp 18

## 2022-01-17 VITALS — BP 125/88 | HR 78 | Temp 98.0°F | Resp 17 | Ht 68.0 in | Wt 158.3 lb

## 2022-01-17 DIAGNOSIS — R569 Unspecified convulsions: Secondary | ICD-10-CM

## 2022-01-17 DIAGNOSIS — D709 Neutropenia, unspecified: Secondary | ICD-10-CM | POA: Diagnosis not present

## 2022-01-17 DIAGNOSIS — C719 Malignant neoplasm of brain, unspecified: Secondary | ICD-10-CM

## 2022-01-17 DIAGNOSIS — Z5111 Encounter for antineoplastic chemotherapy: Secondary | ICD-10-CM | POA: Insufficient documentation

## 2022-01-17 DIAGNOSIS — Z5189 Encounter for other specified aftercare: Secondary | ICD-10-CM | POA: Insufficient documentation

## 2022-01-17 DIAGNOSIS — C712 Malignant neoplasm of temporal lobe: Secondary | ICD-10-CM | POA: Diagnosis not present

## 2022-01-17 LAB — CBC WITH DIFFERENTIAL (CANCER CENTER ONLY)
Abs Immature Granulocytes: 0.01 10*3/uL (ref 0.00–0.07)
Basophils Absolute: 0 10*3/uL (ref 0.0–0.1)
Basophils Relative: 1 %
Eosinophils Absolute: 0.1 10*3/uL (ref 0.0–0.5)
Eosinophils Relative: 6 %
HCT: 33.5 % — ABNORMAL LOW (ref 36.0–46.0)
Hemoglobin: 11 g/dL — ABNORMAL LOW (ref 12.0–15.0)
Immature Granulocytes: 1 %
Lymphocytes Relative: 37 %
Lymphs Abs: 0.8 10*3/uL (ref 0.7–4.0)
MCH: 32.7 pg (ref 26.0–34.0)
MCHC: 32.8 g/dL (ref 30.0–36.0)
MCV: 99.7 fL (ref 80.0–100.0)
Monocytes Absolute: 0.2 10*3/uL (ref 0.1–1.0)
Monocytes Relative: 10 %
Neutro Abs: 1 10*3/uL — ABNORMAL LOW (ref 1.7–7.7)
Neutrophils Relative %: 45 %
Platelet Count: 220 10*3/uL (ref 150–400)
RBC: 3.36 MIL/uL — ABNORMAL LOW (ref 3.87–5.11)
RDW: 16.4 % — ABNORMAL HIGH (ref 11.5–15.5)
Smear Review: NORMAL
WBC Count: 2.2 10*3/uL — ABNORMAL LOW (ref 4.0–10.5)
nRBC: 0 % (ref 0.0–0.2)

## 2022-01-17 LAB — CMP (CANCER CENTER ONLY)
ALT: 9 U/L (ref 0–44)
AST: 13 U/L — ABNORMAL LOW (ref 15–41)
Albumin: 4.5 g/dL (ref 3.5–5.0)
Alkaline Phosphatase: 117 U/L (ref 38–126)
Anion gap: 8 (ref 5–15)
BUN: 14 mg/dL (ref 6–20)
CO2: 26 mmol/L (ref 22–32)
Calcium: 10.5 mg/dL — ABNORMAL HIGH (ref 8.9–10.3)
Chloride: 106 mmol/L (ref 98–111)
Creatinine: 1.07 mg/dL — ABNORMAL HIGH (ref 0.44–1.00)
GFR, Estimated: >60 mL/min (ref >60)
Glucose, Bld: 93 mg/dL (ref 70–99)
Potassium: 3.9 mmol/L (ref 3.5–5.1)
Sodium: 140 mmol/L (ref 135–145)
Total Bilirubin: 1.1 mg/dL (ref 0.3–1.2)
Total Protein: 7.1 g/dL (ref 6.5–8.1)

## 2022-01-17 LAB — TOTAL PROTEIN, URINE DIPSTICK: Protein, ur: <30 mg/dL — AB

## 2022-01-17 MED ORDER — SODIUM CHLORIDE 0.9 % IV SOLN
10.0000 mg | Freq: Once | INTRAVENOUS | Status: AC
  Administered 2022-01-17: 10 mg via INTRAVENOUS
  Filled 2022-01-17: qty 10

## 2022-01-17 MED ORDER — PROCHLORPERAZINE MALEATE 10 MG PO TABS: 10.0000 mg | ORAL_TABLET | Freq: Four times a day (QID) | ORAL | 0 refills | Status: DC | PRN

## 2022-01-17 MED ORDER — ATROPINE SULFATE 1 MG/ML IV SOLN
0.5000 mg | Freq: Once | INTRAVENOUS | Status: DC | PRN
  Filled 2022-01-17: qty 1

## 2022-01-17 MED ORDER — SODIUM CHLORIDE 0.9 % IV SOLN
125.0000 mg/m2 | Freq: Once | INTRAVENOUS | Status: AC
  Administered 2022-01-17: 240 mg via INTRAVENOUS
  Filled 2022-01-17: qty 12

## 2022-01-17 MED ORDER — SODIUM CHLORIDE 0.9 % IV SOLN
10.0000 mg/kg | Freq: Once | INTRAVENOUS | Status: AC
  Administered 2022-01-17: 700 mg via INTRAVENOUS
  Filled 2022-01-17: qty 16

## 2022-01-17 MED ORDER — PALONOSETRON HCL INJECTION 0.25 MG/5ML
0.2500 mg | Freq: Once | INTRAVENOUS | Status: AC
  Administered 2022-01-17: 0.25 mg via INTRAVENOUS
  Filled 2022-01-17: qty 5

## 2022-01-17 MED ORDER — SODIUM CHLORIDE 0.9 % IV SOLN: Freq: Once | INTRAVENOUS | Status: AC

## 2022-01-17 NOTE — Progress Notes (Signed)
[facility] [place], [place] [address] [phone]   Interval Evaluation  Date of Service: 01/17/22 Patient Name: [name] Patient MRN: [id number] Patient DOB: 1965/12/15 Provider: Ventura Sellers, MD  Identifying Statement:  [name] is a 56 y.o. female with left temporal glioblastoma   Oncologic History: Oncology History  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[place])  11/21/2020 Initial Diagnosis   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[place])   11/21/2020 Cancer Staging   Staging form: Brain and Spinal Cord, AJCC 8th Edition - Pathologic stage from 11/21/2020: WHO Grade IV - Signed by Ventura Sellers, MD on 11/30/2020 Histopathologic type: Glioblastoma Stage prefix: Initial diagnosis Histologic grading system: 4 grade system Extent of surgical resection: Subtotal resection Karnofsky performance status: Score 90 Seizures at presentation: Present Duration of symptoms before diagnosis: Short IDH1 mutation: Negative   12/27/2020 - 02/07/2021 Radiation Therapy   IMRT and concurrent Temodar [name])   03/12/2021 - 08/26/2021 Chemotherapy   Completes 5 cycles of adjuvant 5-day Temodar   08/27/2021 Progression   POD #1   09/06/2021 -  Chemotherapy   Initiates second line therapy; CCNU 1m/m2 PO q6 weeks and avastin 178mkg IV q2 weeks   12/28/2021 Progression   POD #2   01/17/2022 -  Chemotherapy   Third line therapy with Irinotecan and Avastin q2 weeks     Biomarkers:  MGMT Methylated.  IDH 1/2 Wild type.  EGFR Unknown  TERT Unknown   Interval History: [name] FIGEROAresents today for planned initiation of CPT-11 and avastin.  No new or progressive complaints, no further seizures.  Her leg lesion is significantly improved with the doxycycline, she has 2 days of treatment remaining.  Short term memory impairment and fatigue are still persistent.       H+P  (11/25/20) Patient presented to medical attention this past month with first ever seizure.  She describes episode of sudden onset "disconnection" with tongue biting and urination, followed by period of confusion.  CNS imaging demonstrated enhancing mass within left anterior temporal lobe, which was mostly resected by [name] 11/21/20.  Since surgery she had not had recurrence of seizures.  She complains of painful cramping of her right leg, which started ~1 week prior.  At this time the cramping is her main complaint.  Otherwise fully functional, independent, would like to return to work if possible.  Medications: Current Outpatient Medications on File Prior to Visit  Medication Sig Dispense Refill   amitriptyline (ELAVIL) 50 MG tablet TAKE 1 TABLET BY MOUTH EVERYDAY AT BEDTIME 90 tablet 2   amLODipine (NORVASC) 10 MG tablet TAKE 1 TABLET BY MOUTH EVERY DAY 90 tablet 1   baclofen 5 MG TABS Take 5 mg by mouth 3 (three) times daily as needed for muscle spasms. 60 tablet 1   CVS D3 125 MCG (5000 UT) capsule TAKE 1 CAPSULE (5,000 UNITS TOTAL) BY MOUTH DAILY. 90 capsule 3   doxycycline (VIBRAMYCIN) 100 MG capsule Take 1 capsule (100 mg total) by mouth 2 (two) times daily. 14 capsule 0   GLEOSTINE 100 MG capsule Take by mouth.     GLEOSTINE 40 MG capsule Take by mouth.     lacosamide (VIMPAT) 50 MG TABS tablet Take 2 tablets (100 mg total) by mouth every morning AND 1 tablet (50 mg total) at bedtime. 90 tablet 3   levETIRAcetam (KEPPRA) 750 MG tablet Take 2 tablets (1,500 mg total) by  mouth 2 (two) times daily. 360 tablet 1   levothyroxine (SYNTHROID) 100 MCG tablet TAKE 1 TABLET BY MOUTH EVERY DAY 90 tablet 3   LORazepam (ATIVAN) 2 MG tablet Take 1 tablet (2 mg total) by mouth every 8 (eight) hours as needed for seizure. 12 tablet 0   ondansetron (ZOFRAN-ODT) 4 MG disintegrating tablet Take 1 tablet (4 mg total) by mouth every 8 (eight) hours as needed for nausea or vomiting. 10 tablet 0    Potassium Chloride ER 20 MEQ TBCR TAKE 1 TABLET BY MOUTH EVERY DAY 90 tablet 3   No current facility-administered medications on file prior to visit.    Allergies: No Known Allergies Past Medical History:  Past Medical History:  Diagnosis Date   Bell's palsy    left side of face- notied in smile and eyes, if very tired   Brain cancer ([place])    Complication of anesthesia    N/V   Essential hypertension    External hemorrhoids    History of blood transfusion    post op Hemorroids   History of pneumonia    Hyperlipidemia    Hyperthyroidism    hx 56 years old- oral meds   Iron deficiency anemia    Migraine headache    Pneumonia    x 2 - years ago   PONV (postoperative nausea and vomiting)    PPD positive    Pulmonary nodules    not seen on plain cxr, first detected 1/07 re ct 10/08 pos IPPD > 15 mm 12/09 FOB/lavage 04/20/08 neg afb smear   PVC's (premature ventricular contractions)    Seizure ([place]) 11/15/2020   Sleep apnea    refused CPAP- mouth piece recommended   Tuberculosis    in her late 30's   Past Surgical History:  Past Surgical History:  Procedure Laterality Date   ABDOMINAL HYSTERECTOMY     APPLICATION OF CRANIAL NAVIGATION N/A 11/21/2020   Procedure: APPLICATION OF CRANIAL NAVIGATION;  Surgeon: Judith Part, MD;  Location: [facility];  Service: Neurosurgery;  Laterality: N/A;   BREAST BIOPSY Right 2014   BREAST BIOPSY Left 10/29/2019   CHOLECYSTECTOMY  2014   COLONOSCOPY  05/04/2009   prolapsing external hemorrhoids   CRANIOTOMY Left 11/21/2020   Procedure: Left craniotomy for tumor resection;  Surgeon: Judith Part, MD;  Location: [facility];  Service: Neurosurgery;  Laterality: Left;   ENDOMETRIAL ABLATION     HEMORRHOID SURGERY     x 3   TUBAL LIGATION     Social History:  Social History   Socioeconomic History   Marital status: Divorced    Spouse name: Not on file   Number of children: 2   Years of education: Not on file   Highest education  level: Associate degree: occupational, Hotel manager, or vocational program  Occupational History   Occupation: accounts Sport and exercise psychologist: [employer]  Tobacco Use   Smoking status: Never   Smokeless tobacco: Never  Vaping Use   Vaping Use: Never used  Substance and Sexual Activity   Alcohol use: No    Alcohol/week: 0.0 standard drinks of alcohol   Drug use: No   Sexual activity: Not on file  Other Topics Concern   Not on file  Social History Narrative   Divorced with 2 children   She works as a Librarian, 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[place] TB exposure 2008 (after nodules found)   From [place] lived there and [place]   Never owned birds  Social Determinants of Health   Financial Resource Strain: Medium Risk (06/13/2021)   Overall Financial Resource Strain (CARDIA)    Difficulty of Paying Living Expenses: Somewhat hard  Food Insecurity: Food Insecurity Present (04/23/2021)   Hunger Vital Sign    Worried About Running Out of Food in the Last Year: Sometimes true    Ran Out of Food in the Last Year: Never true  Transportation Needs: No Transportation Needs (06/13/2021)   PRAPARE - Hydrologist (Medical): No    Lack of Transportation (Non-Medical): No  Physical Activity: Unknown (04/23/2021)   Exercise Vital Sign    Days of Exercise per Week: 0 days    Minutes of Exercise per Session: Not on file  Stress: No Stress Concern Present (04/23/2021)   [place]    Feeling of Stress : Only a little  Social Connections: Moderately Integrated (04/23/2021)   Social Connection and Isolation Panel [NHANES]    Frequency of Communication with Friends and Family: Twice a week    Frequency of Social Gatherings with Friends and Family: Twice a week    Attends Religious Services: More than 4 times per year    Active Member of [employer] or Organizations: Yes    Attends Music therapist: More than 4 times per  year    Marital Status: Divorced  Human resources officer Violence: Not on file   Family History:  Family History  Problem Relation Age of Onset   Diabetes type II Mother    Kidney failure Sister    Diabetes Mellitus II Sister    Colon cancer Maternal Aunt        aunts and uncles   Diabetes Mellitus II Maternal Aunt    Pancreatic cancer Maternal Aunt    Colon cancer Maternal Uncle    Diabetes Mellitus II Maternal Uncle    Heart disease Maternal Grandmother        aunts, uncles, sister   Diabetes Mellitus II Maternal Grandmother    Lung cancer Other        aunts and uncles    Review of Systems: Constitutional: poor appetite Eyes: blurriness of vision Ears, nose, mouth, throat, and face: Doesn't report sore throat Respiratory: Doesn't report cough, dyspnea or wheezes Cardiovascular: Doesn't report palpitation, chest discomfort  Gastrointestinal:  Doesn't report nausea, constipation, diarrhea GU: Doesn't report incontinence Skin: Doesn't report skin rashes Neurological: Per HPI Musculoskeletal: bilateral shoulder pain Behavioral/Psych: +anxiety  Physical Exam: Vitals:   01/17/22 1007  BP: 125/88  Pulse: 78  Resp: 17  Temp: 98 F (36.7 C)  SpO2: 100%   KPS: 70. General: Alert, cooperative, pleasant, in no acute distress Head: Normal EENT: No conjunctival injection or scleral icterus.  Lungs: Resp effort normal Cardiac: Regular rate Abdomen: Non-distended abdomen Skin: No rashes cyanosis or petechiae. Extremities: No clubbing or edema  Neurologic Exam: Mental Status: Awake, alert, attentive to examiner. Oriented to self and environment. Language is notable for modest impairment in fluency.  Impaired short term recall. Cranial Nerves: Visual acuity is grossly normal. Visual fields are full. Extra-ocular movements intact. No ptosis. Face L LMN paresis, chronic. Motor: Tone and bulk are normal. Power is full in both arms and legs. Reflexes are symmetric, no pathologic  reflexes present.  Sensory: Intact to light touch Gait: Normal.   Labs: I have reviewed the data as listed    Component Value Date/Time   NA 140 01/01/2022 1420   K  3.3 (L) 01/01/2022 1420   CL 106 01/01/2022 1420   CO2 27 01/01/2022 1420   GLUCOSE 94 01/01/2022 1420   BUN 9 01/01/2022 1420   CREATININE 0.78 01/01/2022 1420   CREATININE 0.92 01/25/2020 1424   CALCIUM 10.2 01/01/2022 1420   PROT 7.1 01/01/2022 1420   ALBUMIN 4.5 01/01/2022 1420   AST 12 (L) 01/01/2022 1420   ALT 12 01/01/2022 1420   ALKPHOS 155 (H) 01/01/2022 1420   BILITOT 1.7 (H) 01/01/2022 1420   GFRNONAA >60 01/01/2022 1420   GFRNONAA 72 01/25/2020 1424   GFRAA 83 01/25/2020 1424   Lab Results  Component Value Date   WBC 2.2 (L) 01/17/2022   NEUTROABS 1.0 (L) 01/17/2022   HGB 11.0 (L) 01/17/2022   HCT 33.5 (L) 01/17/2022   MCV 99.7 01/17/2022   PLT 220 01/17/2022    Assessment/Plan Focal seizures ([place]) [R56.9]  [name] is clinically stable today.  Labs demonstrate improvement in cytopenias.  Neutropenia remains, today ANC 1.0.  For progressive glioblastoma, we will discontinue CCNU and transition to CPT-11 131m/m2 IV q2 weeks.  We discussed side effects including acute and delayed diarrhea.  Avastin will con't as prior at 168mkg IV q2 weeks.  She is agreeable with this plan.   Zarxio will be given tomorrow as well for bone marrow support, once 24 hours s/p chemotherapy.    Chemotherapy should be held for the following:  ANC less than 1,000  Platelets less than 100,000  LFT or creatinine greater than 2x ULN  If clinical concerns/contraindications develop  Avastin should be held for the following:  ANC less than 500  Platelets less than 50,000  LFT or creatinine greater than 2x ULN  If clinical concerns/contraindications develop  She will finish her antibiotics for leg cellulitis, doxycycline 10043mID.  For seizures, will con't Vimpat 100/50 and con't Keppra 1500m73mID.    Will con't baclofen 5mg 75m PRN.  She may dose Tylenol or Tramadol PRN for joint pain or severe headaches.  We ask that [name] SPINNEYrn to clinic in 2 weeks for next infusion of CPT-11, Avastin and labs.   All questions were answered. The patient knows to call the clinic with any problems, questions or concerns. No barriers to learning were detected.  The total time spent in the encounter was 30 minutes and more than 50% was on counseling and review of test results   ZachaVentura SellersMedical Director of Neuro-Oncology [facility] HospitalesleWest St. Paul4/23 10:10 AM

## 2022-01-17 NOTE — Addendum Note (Signed)
Addended by: Ventura Sellers on: 01/17/2022 01:13 PM   Modules accepted: Orders

## 2022-01-17 NOTE — Progress Notes (Signed)
Ok to treatment Avastin and Irinotecan on 01/17/2022 with current lab values and vitals per Dr Mickeal Skinner.

## 2022-01-17 NOTE — Patient Instructions (Signed)
[name]  Discharge Instructions: Thank you for choosing [name] to provide your oncology and hematology care.   If you have a lab appointment with the [name], please go directly to the [name] and check in at the registration area.   Wear comfortable clothing and clothing appropriate for easy access to any Portacath or PICC line.   We strive to give you quality time with your provider. You may need to reschedule your appointment if you arrive late (15 or more minutes).  Arriving late affects you and other patients whose appointments are after yours.  Also, if you miss three or more appointments without notifying the office, you may be dismissed from the clinic at the provider's discretion.      For prescription refill requests, have your pharmacy contact our office and allow 72 hours for refills to be completed.    Today you received the following chemotherapy and/or immunotherapy agents: Bevacizumab, Irinotecan.       To help prevent nausea and vomiting after your treatment, we encourage you to take your nausea medication as directed.  BELOW ARE SYMPTOMS THAT SHOULD BE REPORTED IMMEDIATELY: *FEVER GREATER THAN 100.4 F (38 C) OR HIGHER *CHILLS OR SWEATING *NAUSEA AND VOMITING THAT IS NOT CONTROLLED WITH YOUR NAUSEA MEDICATION *UNUSUAL SHORTNESS OF BREATH *UNUSUAL BRUISING OR BLEEDING *URINARY PROBLEMS (pain or burning when urinating, or frequent urination) *BOWEL PROBLEMS (unusual diarrhea, constipation, pain near the anus) TENDERNESS IN MOUTH AND THROAT WITH OR WITHOUT PRESENCE OF ULCERS (sore throat, sores in mouth, or a toothache) UNUSUAL RASH, SWELLING OR PAIN  UNUSUAL VAGINAL DISCHARGE OR ITCHING   Items with * indicate a potential emergency and should be followed up as soon as possible or go to the Emergency Department if any problems should occur.  Please show the CHEMOTHERAPY ALERT CARD or IMMUNOTHERAPY ALERT CARD  at check-in to the Emergency Department and triage nurse.  Should you have questions after your visit or need to cancel or reschedule your appointment, please contact [name]  Dept: [phone]  and follow the prompts.  Office hours are 8:00 a.m. to 4:30 p.m. Monday - Friday. Please note that voicemails left after 4:00 p.m. may not be returned until the following business day.  We are closed weekends and major holidays. You have access to a nurse at all times for urgent questions. Please call the main number to the clinic Dept: [phone] and follow the prompts.   For any non-urgent questions, you may also contact your provider using MyChart. We now offer e-Visits for anyone 18 and older to request care online for non-urgent symptoms. For details visit [email or web address].   Also download the MyChart app! Go to the app store, search "MyChart", open the app, select , and log in with your MyChart username and password.  Masks are optional in the cancer centers. If you would like for your care team to wear a mask while they are taking care of you, please let them know. You may have one support person who is at least 54 years old accompany you for your appointments. Irinotecan Injection What is this medication? IRINOTECAN (ir in oh TEE kan) treats some types of cancer. It works by slowing down the growth of cancer cells. This medicine may be used for other purposes; ask your health care provider or pharmacist if you have questions. COMMON BRAND NAME(S): Camptosar What should I tell my care team before I take   this medication? They need to know if you have any of these conditions: Dehydration Diarrhea Infection, especially a viral infection, such as chickenpox, cold sores, herpes Liver disease Low blood cell levels (white cells, red cells, and platelets) Low levels of electrolytes, such as calcium, magnesium, or potassium in your blood Recent or  ongoing radiation An unusual or allergic reaction to irinotecan, other medications, foods, dyes, or preservatives If you or your partner are pregnant or trying to get pregnant Breast-feeding How should I use this medication? This medication is injected into a vein. It is given by your care team in a hospital or clinic setting. Talk to your care team about the use of this medication in children. Special care may be needed. Overdosage: If you think you have taken too much of this medicine contact a poison [name] or emergency room at once. NOTE: This medicine is only for you. Do not share this medicine with others. What if I miss a dose? Keep appointments for follow-up doses. It is important not to miss your dose. Call your care team if you are unable to keep an appointment. What may interact with this medication? Do not take this medication with any of the following: Cobicistat Itraconazole This medication may also interact with the following: Certain antibiotics, such as clarithromycin, rifampin, rifabutin Certain antivirals for HIV or AIDS Certain medications for fungal infections, such as ketoconazole, posaconazole, voriconazole Certain medications for seizures, such as carbamazepine, phenobarbital, phenytoin Gemfibrozil Nefazodone St. John's wort This list may not describe all possible interactions. Give your health care provider a list of all the medicines, herbs, non-prescription drugs, or dietary supplements you use. Also tell them if you smoke, drink alcohol, or use illegal drugs. Some items may interact with your medicine. What should I watch for while using this medication? Your condition will be monitored carefully while you are receiving this medication. You may need blood work while taking this medication. This medication may make you feel generally unwell. This is not uncommon as chemotherapy can affect healthy cells as well as cancer cells. Report any side effects.  Continue your course of treatment even though you feel ill unless your care team tells you to stop. This medication can cause serious side effects. To reduce the risk, your care team may give you other medications to take before receiving this one. Be sure to follow the directions from your care team. This medication may affect your coordination, reaction time, or judgement. Do not drive or operate machinery until you know how this medication affects you. Sit up or stand slowly to reduce the risk of dizzy or fainting spells. Drinking alcohol with this medication can increase the risk of these side effects. This medication may increase your risk of getting an infection. Call your care team for advice if you get a fever, chills, sore throat, or other symptoms of a cold or flu. Do not treat yourself. Try to avoid being around people who are sick. Avoid taking medications that contain aspirin, acetaminophen, ibuprofen, naproxen, or ketoprofen unless instructed by your care team. These medications may hide a fever. This medication may increase your risk to bruise or bleed. Call your care team if you notice any unusual bleeding. Be careful brushing or flossing your teeth or using a toothpick because you may get an infection or bleed more easily. If you have any dental work done, tell your dentist you are receiving this medication. Talk to your care team if you or your partner are pregnant   or think either of you might be pregnant. This medication can cause serious birth defects if taken during pregnancy and for 6 months after the last dose. You will need a negative pregnancy test before starting this medication. Contraception is recommended while taking this medication and for 6 months after the last dose. Your care team can help you find the option that works for you. Do not father a child while taking this medication and for 3 months after the last dose. Use a condom for contraception during this time period. Do  not breastfeed while taking this medication and for 7 days after the last dose. This medication may cause infertility. Talk to your care team if you are concerned about your fertility. What side effects may I notice from receiving this medication? Side effects that you should report to your care team as soon as possible: Allergic reactions--skin rash, itching, hives, swelling of the face, lips, tongue, or throat Dry cough, shortness of breath or trouble breathing Increased saliva or tears, increased sweating, stomach cramping, diarrhea, small pupils, unusual weakness or fatigue, slow heartbeat Infection--fever, chills, cough, sore throat, wounds that don't heal, pain or trouble when passing urine, general feeling of discomfort or being unwell Kidney injury--decrease in the amount of urine, swelling of the ankles, hands, or feet Low red blood cell level--unusual weakness or fatigue, dizziness, headache, trouble breathing Severe or prolonged diarrhea Unusual bruising or bleeding Side effects that usually do not require medical attention (report to your care team if they continue or are bothersome): Constipation Diarrhea Hair loss Loss of appetite Nausea Stomach pain This list may not describe all possible side effects. Call your doctor for medical advice about side effects. You may report side effects to FDA at 1-800-FDA-1088. Where should I keep my medication? This medication is given in a hospital or clinic. It will not be stored at home. NOTE: This sheet is a summary. It may not cover all possible information. If you have questions about this medicine, talk to your doctor, pharmacist, or health care provider.  2023 [employer] Standard (2021-08-30 00:00:00) 

## 2022-01-17 NOTE — Telephone Encounter (Signed)
Received request for medical records and estimated return to work due date.  Completed form and faxed completed documents to [facility] [phone].    Sent to be scanned to Media tab.

## 2022-01-18 ENCOUNTER — Telehealth: Payer: Self-pay | Admitting: *Deleted

## 2022-01-18 ENCOUNTER — Inpatient Hospital Stay: Payer: BC Managed Care – PPO

## 2022-01-18 VITALS — BP 116/82 | HR 77 | Temp 98.2°F | Resp 16

## 2022-01-18 DIAGNOSIS — D709 Neutropenia, unspecified: Secondary | ICD-10-CM | POA: Diagnosis not present

## 2022-01-18 DIAGNOSIS — Z5189 Encounter for other specified aftercare: Secondary | ICD-10-CM | POA: Diagnosis not present

## 2022-01-18 DIAGNOSIS — C712 Malignant neoplasm of temporal lobe: Secondary | ICD-10-CM | POA: Diagnosis not present

## 2022-01-18 DIAGNOSIS — Z5111 Encounter for antineoplastic chemotherapy: Secondary | ICD-10-CM | POA: Diagnosis not present

## 2022-01-18 DIAGNOSIS — T451X5A Adverse effect of antineoplastic and immunosuppressive drugs, initial encounter: Secondary | ICD-10-CM

## 2022-01-18 MED ORDER — FILGRASTIM-SNDZ 300 MCG/0.5ML IJ SOSY
300.0000 ug | PREFILLED_SYRINGE | Freq: Once | INTRAMUSCULAR | Status: AC
  Administered 2022-01-18: 300 ug via SUBCUTANEOUS
  Filled 2022-01-18: qty 0.5

## 2022-01-18 NOTE — Progress Notes (Signed)
Pt here for Filgrastim injection post chemo  9/14.   Denied nausea/vomiting,  denied diarrhea - stated has constipation and started taking Linzess yesterday.  No problems with voiding.  Eating some.  Reinforced need to increase po fluids as much as tolerated.    Pt took own Claritin med while waiting for injection.  Tolerated Filgrastim injection without difficulty.  Pt was stable at discharge via self ambulation with steady gait.

## 2022-01-18 NOTE — Telephone Encounter (Signed)
-----   Message from Rafael Bihari, RN sent at 01/17/2022  2:31 PM EDT ----- Regarding: Dr Mickeal Skinner pt, first time Irinotecan. Dr Mickeal Skinner pt came in 9/14 for first time Irinotecan. Tolerated infusion well. Needs call back.

## 2022-01-18 NOTE — Telephone Encounter (Signed)
Called pt to see how she did with her recent treatment.  She reports that she felt a little nauseated & hadn't eaten but took her nausea meds & that helped.  She states her daughter was going to get her some fries.  Informed to try something a little lighter like saltines, dry toast, jello, etc first to see if tolerated.  Discussed fluids & protein drinks.  She is coming in today for injection.  She reports knowing how to reach [place] & knows her appts.  Will have [facility] nurse check to make sure she knows what to do if she has diarrhea & has written instructions for imodium.

## 2022-01-19 ENCOUNTER — Encounter (HOSPITAL_BASED_OUTPATIENT_CLINIC_OR_DEPARTMENT_OTHER): Payer: Self-pay | Admitting: Emergency Medicine

## 2022-01-19 DIAGNOSIS — K6289 Other specified diseases of anus and rectum: Secondary | ICD-10-CM | POA: Insufficient documentation

## 2022-01-19 DIAGNOSIS — I10 Essential (primary) hypertension: Secondary | ICD-10-CM | POA: Insufficient documentation

## 2022-01-19 DIAGNOSIS — E039 Hypothyroidism, unspecified: Secondary | ICD-10-CM | POA: Diagnosis not present

## 2022-01-19 DIAGNOSIS — J45909 Unspecified asthma, uncomplicated: Secondary | ICD-10-CM | POA: Diagnosis not present

## 2022-01-19 DIAGNOSIS — Z79899 Other long term (current) drug therapy: Secondary | ICD-10-CM | POA: Diagnosis not present

## 2022-01-19 DIAGNOSIS — R110 Nausea: Secondary | ICD-10-CM | POA: Diagnosis not present

## 2022-01-19 DIAGNOSIS — R1030 Lower abdominal pain, unspecified: Secondary | ICD-10-CM | POA: Diagnosis not present

## 2022-01-19 MED ORDER — ONDANSETRON 4 MG PO TBDP
ORAL_TABLET | ORAL | Status: AC
  Administered 2022-01-19: 4 mg via ORAL
  Filled 2022-01-19: qty 1

## 2022-01-19 MED ORDER — ONDANSETRON 4 MG PO TBDP: 4.0000 mg | ORAL_TABLET | Freq: Once | ORAL | Status: AC

## 2022-01-19 NOTE — ED Triage Notes (Signed)
Pt presents to ED POV. Pt c/o constipation x1w. Pt reports that she has tried suppository at home w/o relief. Pt reports lower abd pain and bloating.

## 2022-01-20 ENCOUNTER — Emergency Department (HOSPITAL_BASED_OUTPATIENT_CLINIC_OR_DEPARTMENT_OTHER)
Admission: EM | Admit: 2022-01-20 | Discharge: 2022-01-20 | Disposition: A | Discharge status: Home / Self Care | Payer: BC Managed Care – PPO | Attending: Emergency Medicine | Admitting: Emergency Medicine

## 2022-01-20 ENCOUNTER — Emergency Department (HOSPITAL_BASED_OUTPATIENT_CLINIC_OR_DEPARTMENT_OTHER): Payer: BC Managed Care – PPO

## 2022-01-20 DIAGNOSIS — I700 Atherosclerosis of aorta: Secondary | ICD-10-CM | POA: Diagnosis not present

## 2022-01-20 DIAGNOSIS — R109 Unspecified abdominal pain: Secondary | ICD-10-CM | POA: Diagnosis not present

## 2022-01-20 DIAGNOSIS — R198 Other specified symptoms and signs involving the digestive system and abdomen: Secondary | ICD-10-CM

## 2022-01-20 DIAGNOSIS — K6289 Other specified diseases of anus and rectum: Secondary | ICD-10-CM

## 2022-01-20 LAB — COMPREHENSIVE METABOLIC PANEL
ALT: 12 U/L (ref 0–44)
AST: 13 U/L — ABNORMAL LOW (ref 15–41)
Albumin: 4.7 g/dL (ref 3.5–5.0)
Alkaline Phosphatase: 105 U/L (ref 38–126)
Anion gap: 11 (ref 5–15)
BUN: 21 mg/dL — ABNORMAL HIGH (ref 6–20)
CO2: 24 mmol/L (ref 22–32)
Calcium: 10.5 mg/dL — ABNORMAL HIGH (ref 8.9–10.3)
Chloride: 105 mmol/L (ref 98–111)
Creatinine, Ser: 1.03 mg/dL — ABNORMAL HIGH (ref 0.44–1.00)
GFR, Estimated: >60 mL/min (ref >60)
Glucose, Bld: 114 mg/dL — ABNORMAL HIGH (ref 70–99)
Potassium: 4 mmol/L (ref 3.5–5.1)
Sodium: 140 mmol/L (ref 135–145)
Total Bilirubin: 2.3 mg/dL — ABNORMAL HIGH (ref 0.3–1.2)
Total Protein: 7.2 g/dL (ref 6.5–8.1)

## 2022-01-20 LAB — CBC WITH DIFFERENTIAL/PLATELET
Abs Immature Granulocytes: 1 10*3/uL — ABNORMAL HIGH (ref 0.00–0.07)
Basophils Absolute: 0 10*3/uL (ref 0.0–0.1)
Basophils Relative: 0 %
Eosinophils Absolute: 0 10*3/uL (ref 0.0–0.5)
Eosinophils Relative: 0 %
HCT: 31.6 % — ABNORMAL LOW (ref 36.0–46.0)
Hemoglobin: 10.6 g/dL — ABNORMAL LOW (ref 12.0–15.0)
Immature Granulocytes: 10 %
Lymphocytes Relative: 4 %
Lymphs Abs: 0.4 10*3/uL — ABNORMAL LOW (ref 0.7–4.0)
MCH: 32.8 pg (ref 26.0–34.0)
MCHC: 33.5 g/dL (ref 30.0–36.0)
MCV: 97.8 fL (ref 80.0–100.0)
Monocytes Absolute: 0.1 10*3/uL (ref 0.1–1.0)
Monocytes Relative: 1 %
Neutro Abs: 8.6 10*3/uL — ABNORMAL HIGH (ref 1.7–7.7)
Neutrophils Relative %: 85 %
Platelets: 180 10*3/uL (ref 150–400)
RBC: 3.23 MIL/uL — ABNORMAL LOW (ref 3.87–5.11)
RDW: 15.5 % (ref 11.5–15.5)
WBC: 10.1 10*3/uL (ref 4.0–10.5)
nRBC: 0 % (ref 0.0–0.2)

## 2022-01-20 LAB — URINALYSIS, ROUTINE W REFLEX MICROSCOPIC
Bilirubin Urine: NEGATIVE
Glucose, UA: NEGATIVE mg/dL
Hgb urine dipstick: NEGATIVE
Ketones, ur: 15 mg/dL — AB
Leukocytes,Ua: NEGATIVE
Nitrite: NEGATIVE
Specific Gravity, Urine: >1.046 — ABNORMAL HIGH (ref 1.005–1.030)
pH: 5.5 (ref 5.0–8.0)

## 2022-01-20 LAB — LIPASE, BLOOD: Lipase: 17 U/L (ref 11–51)

## 2022-01-20 MED ORDER — AMOXICILLIN-POT CLAVULANATE 875-125 MG PO TABS
1.0000 | ORAL_TABLET | Freq: Once | ORAL | Status: AC
  Administered 2022-01-20: 1 via ORAL
  Filled 2022-01-20: qty 1

## 2022-01-20 MED ORDER — AMOXICILLIN-POT CLAVULANATE 875-125 MG PO TABS
1.0000 | ORAL_TABLET | Freq: Two times a day (BID) | ORAL | 0 refills | Status: DC
Start: 2022-01-20 — End: 2022-01-31

## 2022-01-20 MED ORDER — IOHEXOL 300 MG/ML  SOLN
100.0000 mL | Freq: Once | INTRAMUSCULAR | Status: AC | PRN
  Administered 2022-01-20: 100 mL via INTRAVENOUS

## 2022-01-20 NOTE — ED Provider Notes (Signed)
[facility] EMERGENCY DEPT Provider Note  CSN: [id number] Arrival date & time: 01/19/22 2226  Chief Complaint(s) Constipation  HPI [name] is a 56 y.o. female with a past medical history listed below including glioblastoma status post resection currently undergoing chemotherapy here for 1 week of lower abdominal discomfort and constipation.  Patient was seen couple weeks ago and treated for 7 diabetes with Keflex and doxycycline.  She believes that her symptoms are related to that.  She endorses nausea without emesis.  Attempted to use suppositories with minimal relief.  States that she is having small pellets.  No bleeding.  No fevers or chills.  No other physical complaints.   Constipation   Past Medical History Past Medical History:  Diagnosis Date   Bell's palsy    left side of face- notied in smile and eyes, if very tired   Brain cancer ([place])    Complication of anesthesia    N/V   Essential hypertension    External hemorrhoids    History of blood transfusion    post op Hemorroids   History of pneumonia    Hyperlipidemia    Hyperthyroidism    hx 56 years old- oral meds   Iron deficiency anemia    Migraine headache    Pneumonia    x 2 - years ago   PONV (postoperative nausea and vomiting)    PPD positive    Pulmonary nodules    not seen on plain cxr, first detected 1/07 re ct 10/08 pos IPPD > 15 mm 12/09 FOB/lavage 04/20/08 neg afb smear   PVC's (premature ventricular contractions)    Seizure ([place]) 11/15/2020   Sleep apnea    refused CPAP- mouth piece recommended   Tuberculosis    in her late 30's   Patient Active Problem List   Diagnosis Date Noted   Neutropenia ([place]) 10/18/2021   Genetic testing 08/24/2021   Family history of colon cancer 08/03/2021   Pancreatic mass 07/31/2021   S/P craniotomy 11/21/2020   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[place]) 11/21/2020   Focal seizures ([place]) 11/15/2020   OSA (obstructive  sleep apnea) 06/13/2017   Hypothyroid 06/26/2015   Brachial neuritis 03/29/2015   Rotator cuff tear 12/21/2014   Abdominal pain, right upper quadrant 03/14/2014   Leg cramps 07/15/2013   Acquired anal stenosis 06/18/2011   Chronic constipation 06/18/2011   PVC's (premature ventricular contractions) 05/23/2011   Headache, migraine, intractable 05/09/2011   Asthma exacerbation, allergic 07/31/2010   POSITIVE PPD 05/04/2008   Hyperlipidemia 02/05/2008   Essential hypertension 02/05/2008   Pulmonary nodules 02/05/2008   Chronic cough 02/05/2008   Home Medication(s) Prior to Admission medications   Medication Sig Start Date End Date Taking? Authorizing Provider  amoxicillin-clavulanate (AUGMENTIN) 875-125 MG tablet Take 1 tablet by mouth every 12 (twelve) hours. 01/20/22  Yes Karmina Zufall, Grayce Sessions, MD  amitriptyline (ELAVIL) 50 MG tablet TAKE 1 TABLET BY MOUTH EVERYDAY AT BEDTIME 06/21/21   Vaslow, Acey Lav, MD  amLODipine (NORVASC) 10 MG tablet TAKE 1 TABLET BY MOUTH EVERY DAY 12/30/21   Vaslow, Acey Lav, MD  baclofen 5 MG TABS Take 5 mg by mouth 3 (three) times daily as needed for muscle spasms. 05/29/21   Vaslow, Acey Lav, MD  [facility] D3 125 MCG (5000 UT) capsule TAKE 1 CAPSULE (5,000 UNITS TOTAL) BY MOUTH DAILY. 01/26/21   Nafziger, [name], NP  GLEOSTINE 100 MG capsule Take by mouth. 11/09/21   [provider]  GLEOSTINE 40 MG capsule  Take by mouth. 11/09/21   [provider]  lacosamide (VIMPAT) 50 MG TABS tablet Take 2 tablets (100 mg total) by mouth every morning AND 1 tablet (50 mg total) at bedtime. 12/25/21   Ventura Sellers, MD  levETIRAcetam (KEPPRA) 750 MG tablet Take 2 tablets (1,500 mg total) by mouth 2 (two) times daily. 10/16/21   Vaslow, Acey Lav, MD  levothyroxine (SYNTHROID) 100 MCG tablet TAKE 1 TABLET BY MOUTH EVERY DAY 02/06/21   Nafziger, [name], NP  LORazepam (ATIVAN) 2 MG tablet Take 1 tablet (2 mg total) by mouth every 8 (eight) hours as needed for seizure.  12/25/21   Ventura Sellers, MD  ondansetron (ZOFRAN-ODT) 4 MG disintegrating tablet Take 1 tablet (4 mg total) by mouth every 8 (eight) hours as needed for nausea or vomiting. 12/17/21   Barrie Folk, PA-C  Potassium Chloride ER 20 MEQ TBCR TAKE 1 TABLET BY MOUTH EVERY DAY 07/13/21   Nafziger, [name], NP  prochlorperazine (COMPAZINE) 10 MG tablet Take 1 tablet (10 mg total) by mouth every 6 (six) hours as needed for nausea or vomiting. 01/17/22   Ventura Sellers, MD                                                                                                                                    Allergies Patient has no known allergies.  Review of Systems Review of Systems  Gastrointestinal:  Positive for constipation.   As noted in HPI  Physical Exam Vital Signs  I have reviewed the triage vital signs BP 121/86   Pulse 62   Temp 98.1 F (36.7 C)   Resp 18   SpO2 99%   Physical Exam Vitals reviewed.  Constitutional:      General: She is not in acute distress.    Appearance: She is well-developed. She is not diaphoretic.  HENT:     Head: Normocephalic and atraumatic.     Right Ear: External ear normal.     Left Ear: External ear normal.     Nose: Nose normal.  Eyes:     General: No scleral icterus.    Conjunctiva/sclera: Conjunctivae normal.  Neck:     Trachea: Phonation normal.  Cardiovascular:     Rate and Rhythm: Normal rate and regular rhythm.  Pulmonary:     Effort: Pulmonary effort is normal. No respiratory distress.     Breath sounds: No stridor.  Abdominal:     General: There is no distension.     Tenderness: There is abdominal tenderness in the suprapubic area and left lower quadrant. There is no guarding or rebound.  Musculoskeletal:        General: Normal range of motion.     Cervical back: Normal range of motion.  Neurological:     Mental Status: She is alert and oriented to person, place, and time.     Cranial Nerves: Facial asymmetry present.  Psychiatric:        Behavior: Behavior normal.     ED Results and Treatments Labs (all labs ordered are listed, but only abnormal results are displayed) Labs Reviewed  COMPREHENSIVE METABOLIC PANEL - Abnormal; Notable for the following components:      Result Value   Glucose, Bld 114 (*)    BUN 21 (*)    Creatinine, Ser 1.03 (*)    Calcium 10.5 (*)    AST 13 (*)    Total Bilirubin 2.3 (*)    All other components within normal limits  CBC WITH DIFFERENTIAL/PLATELET - Abnormal; Notable for the following components:   RBC 3.23 (*)    Hemoglobin 10.6 (*)    HCT 31.6 (*)    Neutro Abs 8.6 (*)    Lymphs Abs 0.4 (*)    Abs Immature Granulocytes 1.00 (*)    All other components within normal limits  URINALYSIS, ROUTINE W REFLEX MICROSCOPIC - Abnormal; Notable for the following components:   Specific Gravity, Urine >1.046 (*)    Ketones, ur 15 (*)    Protein, ur TRACE (*)    All other components within normal limits  LIPASE, BLOOD                                                                                                                         EKG  EKG Interpretation  Date/Time:    Ventricular Rate:    PR Interval:    QRS Duration:   QT Interval:    QTC Calculation:   R Axis:     Text Interpretation:         Radiology CT ABDOMEN PELVIS W CONTRAST  Result Date: 01/20/2022 CLINICAL DATA:  56 year old female with left lower quadrant pain. Constipation for 1 week. Pancreatic head mass. EXAM: CT ABDOMEN AND PELVIS WITH CONTRAST TECHNIQUE: Multidetector CT imaging of the abdomen and pelvis was performed using the standard protocol following bolus administration of intravenous contrast. RADIATION DOSE REDUCTION: This exam was performed according to the departmental dose-optimization program which includes automated exposure control, adjustment of the mA and/or kV according to patient size and/or use of iterative reconstruction technique. CONTRAST:  163m OMNIPAQUE IOHEXOL  300 MG/ML  SOLN COMPARISON:  CT Abdomen and Pelvis 08/24/2021 and earlier. FINDINGS: Lower chest: Partially visible chronic right lower lobe calcified granuloma on series 4, image 1 (stable compared to chest CT 11/15/2020, benign). No acute lung base finding. No pericardial or pleural effusion. Hepatobiliary: Chronically absent gallbladder. Liver appears stable and negative except for possible steatosis. Pancreas: No pancreatic duct dilatation. Slightly exophytic and hypodense area of the pancreatic head on series 2, image 32 does not appear significantly changed since January. No regional inflammation. No pancreatic tail atrophy. Spleen: Negative. Adrenals/Urinary Tract: Normal adrenal glands. Nonobstructed kidneys. Symmetric renal enhancement and contrast excretion. No nephrolithiasis. No hydroureter. Diminutive bladder. Chronic pelvic phleboliths. Stomach/Bowel: Fluid in the rectum along with evidence of mucosal thickening and/or hyperenhancement (series 2 images 71 and  80). Fluid in the distal sigmoid colon. No definite perirectal inflammation. No sigmoid wall thickening. Decompressed descending colon and proximal sigmoid. Decompressed splenic flexure with occasional diverticula. Retained stool in the transverse and right colon. Elongated appendix remains within normal limits on coronal image 53. No other large bowel inflammation. Decompressed terminal ileum. No dilated small bowel. Decompressed stomach and duodenum. No free air or free fluid. Vascular/Lymphatic: Aortoiliac calcified atherosclerosis. Normal caliber abdominal aorta. Major arterial structures are patent. Portal venous system is patent. No lymphadenopathy identified. Reproductive: Surgically absent uterus. Diminutive or absent ovaries. Other: No pelvic free fluid. Musculoskeletal: No acute or suspicious osseous lesion identified. Trace ventral left abdominal wall gas in the subcutaneous fat on series 2, image 62 such as from recent injection.  IMPRESSION: 1. Difficult to exclude acute proctitis, with suggestion of mucosal hyperenhancement and some fluid in the rectum. But no evidence of bowel obstruction. And no other acute or inflammatory process identified in the abdomen or pelvis. 2. Chronic pancreatic head mass does not appear significantly changed since January. No regional inflammation or lymphadenopathy. See follow-up which was recommended on Abdomen MRI 11/16/2020. 3. Aortic Atherosclerosis (ICD10-I70.0). Electronically Signed   By: Genevie Ann M.D.   On: 01/20/2022 05:53    Medications Ordered in ED Medications  ondansetron (ZOFRAN-ODT) disintegrating tablet 4 mg (4 mg Oral Given 01/19/22 2245)  iohexol (OMNIPAQUE) 300 MG/ML solution 100 mL (100 mLs Intravenous Contrast Given 01/20/22 0448)  amoxicillin-clavulanate (AUGMENTIN) 875-125 MG per tablet 1 tablet (1 tablet Oral Given 01/20/22 0701)                                                                                                                                     Procedures Procedures  (including critical care time)  Medical Decision Making / ED Course   Medical Decision Making Amount and/or Complexity of Data Reviewed External Data Reviewed: radiology.    Details: Colonoscopy from 2010 without evidence of diverticulosis. Labs: ordered. Decision-making details documented in ED Course. Radiology: ordered and independent interpretation performed. Decision-making details documented in ED Course.  Risk Prescription drug management. Parenteral controlled substances.   Lower abdominal pain.  New constipation. Will assess for evidence of intra-abdominal inflammatory/infectious process such as diverticulitis. We will also assess for urinary tract infection.  CBC without leukocytosis but white count is up from her baseline.  Possibly related to filgrastim.  Metabolic panel without significant electrolyte derangements or renal insufficiency. UA without evidence of  infection  CT scan without evidence of diverticulitis.  It is possible evidence of proctitis.  No evidence of constipation.  Patient is not septic and tolerating p.o.  She was able to have a bowel movement emergency department.  We will treat with outpatient antibiotics.  Close PCP follow-up recommended.       Final Clinical Impression(s) / ED Diagnoses Final diagnoses:  Rectal tenesmus  Proctitis   The patient appears reasonably screened and/or stabilized  for discharge and I doubt any other medical condition or other [facility] requiring further screening, evaluation, or treatment in the ED at this time. I have discussed the findings, Dx and Tx plan with the patient/family who expressed understanding and agree(s) with the plan. Discharge instructions discussed at length. The patient/family was given strict return precautions who verbalized understanding of the instructions. No further questions at time of discharge.  Disposition: Discharge  Condition: Good  ED Discharge Orders          Ordered    amoxicillin-clavulanate (AUGMENTIN) 875-125 MG tablet  Every 12 hours        01/20/22 0656             Follow Up: [name], NP [address] [place] [address] [phone]  Call  to schedule an appointment for close follow up           This chart was dictated using voice recognition software.  Despite best efforts to proofread,  errors can occur which can change the documentation meaning.    Fatima Blank, MD 01/20/22 [phone]

## 2022-01-20 NOTE — ED Notes (Signed)
Pt said that she just went to the bathroom and had a large bowel movement.

## 2022-01-22 ENCOUNTER — Inpatient Hospital Stay: Payer: BC Managed Care – PPO | Admitting: Licensed Clinical Social Worker

## 2022-01-22 NOTE — Progress Notes (Signed)
[facility] CSW Progress Note  Clinical Education officer, museum contacted patient by phone to assess psychosocial needs.  Patient sounded drowsy on the phone.  She reports recently being in the hospital due to abdominal pain.  She stated she is not eating as much as she normally does and was open to a dietician referral.  CSW to make referral to Dory Peru of the [facility] explored patient's understanding of her illness.  She acknowledged that she is working with her MD to treat her cancer.  She remains concerned about her pancreas. She is in communication with her employer regarding her disability and said she is waiting to meet with her MD.  Patient said she has a good support system.   Rodman Pickle Deral Schellenberg, LCSW

## 2022-01-24 ENCOUNTER — Other Ambulatory Visit: Payer: Self-pay

## 2022-01-24 ENCOUNTER — Telehealth: Payer: Self-pay | Admitting: *Deleted

## 2022-01-24 ENCOUNTER — Other Ambulatory Visit: Payer: Self-pay | Admitting: Internal Medicine

## 2022-01-24 DIAGNOSIS — C719 Malignant neoplasm of brain, unspecified: Secondary | ICD-10-CM

## 2022-01-24 MED ORDER — DEXAMETHASONE 2 MG PO TABS: 2.0000 mg | ORAL_TABLET | Freq: Every day | ORAL | 1 refills | Status: DC

## 2022-01-24 MED ORDER — SODIUM CHLORIDE 0.9 % IV SOLN: Freq: Once | INTRAVENOUS | Status: DC

## 2022-01-24 NOTE — Progress Notes (Signed)
Fluid ordered for [facility] visit. Per Lisabeth Devoid., PA, 1 liter over 1 hour.

## 2022-01-24 NOTE — Telephone Encounter (Signed)
Patient called to report that she is extremely fatigued, has no desire to eat and is forcing food down.  She has been getting limited hydration.  States that what she does eat is passed via diarrhea almost after every meal.   She received her first dose of Irinotecan this past week and did not receive atropine.  She states that her bowel movements are approximately 3 in a day.  Routing to provider to see if he feels that the patient would benefit from IV fluids and if he has any further suggestions with navigating the fatigue and food aversion.   Routed to Dr Mickeal Skinner

## 2022-01-24 NOTE — Telephone Encounter (Signed)
Per Dr Mickeal Skinner:  Is the dosing imodium appropriately (every 2 hours)? Can we get her in for some fluids tomorrow?   I will also start her on low dose of decadron to help with nausea and appetite, '2mg'$  daily  that can be started today, [facility]  Coordinated appointment with [facility] to receive IVF.  Spoke with patient and she is agreeable to the getting the steroids and starting that as soon as possible and coming in tomorrow for fluids.    We also spoke about the imodium but she is aware to make sure she doesn't take too much so that she doesn't end up with constipation like she did after the infusion.

## 2022-01-25 ENCOUNTER — Inpatient Hospital Stay: Payer: BC Managed Care – PPO

## 2022-01-25 ENCOUNTER — Other Ambulatory Visit: Payer: Self-pay

## 2022-01-25 VITALS — BP 111/79 | HR 66 | Temp 98.0°F | Resp 16

## 2022-01-25 DIAGNOSIS — D709 Neutropenia, unspecified: Secondary | ICD-10-CM | POA: Diagnosis not present

## 2022-01-25 DIAGNOSIS — C719 Malignant neoplasm of brain, unspecified: Secondary | ICD-10-CM

## 2022-01-25 DIAGNOSIS — Z5189 Encounter for other specified aftercare: Secondary | ICD-10-CM | POA: Diagnosis not present

## 2022-01-25 DIAGNOSIS — C712 Malignant neoplasm of temporal lobe: Secondary | ICD-10-CM | POA: Diagnosis not present

## 2022-01-25 DIAGNOSIS — Z5111 Encounter for antineoplastic chemotherapy: Secondary | ICD-10-CM | POA: Diagnosis not present

## 2022-01-25 MED ORDER — DEXAMETHASONE 4 MG PO TABS
2.0000 mg | ORAL_TABLET | Freq: Every day | ORAL | Status: AC
  Administered 2022-01-25: 2 mg via ORAL
  Filled 2022-01-25: qty 1

## 2022-01-25 MED ORDER — SODIUM CHLORIDE 0.9 % IV SOLN: Freq: Once | INTRAVENOUS | Status: AC

## 2022-01-25 NOTE — Patient Instructions (Signed)
Rehydration, Adult Rehydration is the replacement of body fluids, salts, and minerals (electrolytes) that are lost during dehydration. Dehydration is when there is not enough water or other fluids in the body. This happens when you lose more fluids than you take in. Common causes of dehydration include: Not drinking enough fluids. This can occur when you are ill or doing activities that require a lot of energy, especially in hot weather. Conditions that cause loss of water or other fluids, such as diarrhea, vomiting, sweating, or urinating a lot. Other illnesses, such as fever or infection. Certain medicines, such as those that remove excess fluid from the body (diuretics). Symptoms of mild or moderate dehydration may include thirst, dry lips and mouth, and dizziness. Symptoms of severe dehydration may include increased heart rate, confusion, fainting, and not urinating. For severe dehydration, you may need to get fluids through an IV at the hospital. For mild or moderate dehydration, you can usually rehydrate at home by drinking certain fluids as told by your health care provider. What are the risks? Generally, rehydration is safe. However, taking in too much fluid (overhydration) can be a problem. This is rare. Overhydration can cause an electrolyte imbalance, kidney failure, or a decrease in salt (sodium) levels in the body. Supplies needed You will need an oral rehydration solution (ORS) if your health care provider tells you to use one. This is a drink to treat dehydration. It can be found in pharmacies and retail stores. How to rehydrate Fluids Follow instructions from your health care provider for rehydration. The kind of fluid and the amount you should drink depend on your condition. In general, you should choose drinks that you prefer. If told by your health care provider, drink an ORS. Make an ORS by following instructions on the package. Start by drinking small amounts, about  cup (120  mL) every 5-10 minutes. Slowly increase how much you drink until you have taken the amount recommended by your health care provider. Drink enough clear fluids to keep your urine pale yellow. If you were told to drink an ORS, finish it first, then start slowly drinking other clear fluids. Drink fluids such as: Water. This includes sparkling water and flavored water. Drinking only water can lead to having too little sodium in your body (hyponatremia). Follow the advice of your health care provider. Water from ice chips you suck on. Fruit juice with water you add to it (diluted). Sports drinks. Hot or cold herbal teas. Broth-based soups. Milk or milk products. Food Follow instructions from your health care provider about what to eat while you rehydrate. Your health care provider may recommend that you slowly begin eating regular foods in small amounts. Eat foods that contain a healthy balance of electrolytes, such as bananas, oranges, potatoes, tomatoes, and spinach. Avoid foods that are greasy or contain a lot of sugar. In some cases, you may get nutrition through a feeding tube that is passed through your nose and into your stomach (nasogastric tube, or NG tube). This may be done if you have uncontrolled vomiting or diarrhea. Beverages to avoid  Certain beverages may make dehydration worse. While you rehydrate, avoid drinking alcohol. How to tell if you are recovering from dehydration You may be recovering from dehydration if: You are urinating more often than before you started rehydrating. Your urine is pale yellow. Your energy level improves. You vomit less frequently. You have diarrhea less frequently. Your appetite improves or returns to normal. You feel less dizzy or less light-headed.   Your skin tone and color start to look more normal. Follow these instructions at home: Take over-the-counter and prescription medicines only as told by your health care provider. Do not take sodium  tablets. Doing this can lead to having too much sodium in your body (hypernatremia). Contact a health care provider if: You continue to have symptoms of mild or moderate dehydration, such as: Thirst. Dry lips. Slightly dry mouth. Dizziness. Dark urine or less urine than normal. Muscle cramps. You continue to vomit or have diarrhea. Get help right away if you: Have symptoms of dehydration that get worse. Have a fever. Have a severe headache. Have been vomiting and the following happens: Your vomiting gets worse or does not go away. Your vomit includes blood or green matter (bile). You cannot eat or drink without vomiting. Have problems with urination or bowel movements, such as: Diarrhea that gets worse or does not go away. Blood in your stool (feces). This may cause stool to look black and tarry. Not urinating, or urinating only a small amount of very dark urine, within 6-8 hours. Have trouble breathing. Have symptoms that get worse with treatment. These symptoms may represent a serious problem that is an emergency. Do not wait to see if the symptoms will go away. Get medical help right away. Call your local emergency services (911 in the [place] Do not drive yourself to the hospital. Summary Rehydration is the replacement of body fluids and minerals (electrolytes) that are lost during dehydration. Follow instructions from your health care provider for rehydration. The kind of fluid and amount you should drink depend on your condition. Slowly increase how much you drink until you have taken the amount recommended by your health care provider. Contact your health care provider if you continue to show signs of mild or moderate dehydration. This information is not intended to replace advice given to you by your health care provider. Make sure you discuss any questions you have with your health care provider. Document Revised: 06/23/2019 Document Reviewed: 05/03/2019 Elsevier Patient  Education  2023 [employer]  

## 2022-01-28 ENCOUNTER — Telehealth: Payer: Self-pay | Admitting: *Deleted

## 2022-01-28 NOTE — Telephone Encounter (Signed)
Patient called to advise that she was up to use the bathroom and felt dizzy almost to the point of passing out and when she got up to walk back into another room she doesn't recall anything except her daughter helping her.  She was alert at that point and then vomited.  She has been unable to eat and keep food down. She is still having diarrhea and not taking the imodium as instructed.  She also did not get the steroid that was prescribed last Thursday.    Daughter and brother were both on the phone.  Requested that they attend next visit.  Brother will check with pharmacy to see if Rx was picked up and he will report back if he feels that she needs to receive additional fluids prior to her visit on Thursday.

## 2022-01-29 ENCOUNTER — Other Ambulatory Visit: Payer: BC Managed Care – PPO

## 2022-01-29 ENCOUNTER — Ambulatory Visit: Payer: BC Managed Care – PPO | Admitting: Internal Medicine

## 2022-01-29 ENCOUNTER — Ambulatory Visit: Payer: BC Managed Care – PPO

## 2022-01-29 ENCOUNTER — Telehealth: Payer: Self-pay | Admitting: *Deleted

## 2022-01-29 NOTE — Telephone Encounter (Signed)
Attempted to reach patients brother to check on status of patient.  Left message pending call back.

## 2022-01-30 MED FILL — Dexamethasone Sodium Phosphate Inj 100 MG/10ML: INTRAMUSCULAR | Qty: 1 | Status: AC

## 2022-01-31 ENCOUNTER — Inpatient Hospital Stay: Payer: BC Managed Care – PPO

## 2022-01-31 ENCOUNTER — Inpatient Hospital Stay (HOSPITAL_BASED_OUTPATIENT_CLINIC_OR_DEPARTMENT_OTHER): Payer: BC Managed Care – PPO | Admitting: Internal Medicine

## 2022-01-31 VITALS — BP 110/73 | HR 73 | Temp 98.0°F | Resp 17 | Ht 68.0 in | Wt 151.6 lb

## 2022-01-31 DIAGNOSIS — C719 Malignant neoplasm of brain, unspecified: Secondary | ICD-10-CM | POA: Diagnosis not present

## 2022-01-31 DIAGNOSIS — C712 Malignant neoplasm of temporal lobe: Secondary | ICD-10-CM | POA: Diagnosis not present

## 2022-01-31 DIAGNOSIS — D709 Neutropenia, unspecified: Secondary | ICD-10-CM | POA: Diagnosis not present

## 2022-01-31 DIAGNOSIS — T451X5A Adverse effect of antineoplastic and immunosuppressive drugs, initial encounter: Secondary | ICD-10-CM

## 2022-01-31 DIAGNOSIS — Z5189 Encounter for other specified aftercare: Secondary | ICD-10-CM | POA: Diagnosis not present

## 2022-01-31 DIAGNOSIS — R569 Unspecified convulsions: Secondary | ICD-10-CM | POA: Diagnosis not present

## 2022-01-31 DIAGNOSIS — Z5111 Encounter for antineoplastic chemotherapy: Secondary | ICD-10-CM | POA: Diagnosis not present

## 2022-01-31 LAB — CMP (CANCER CENTER ONLY)
ALT: 11 U/L (ref 0–44)
AST: 12 U/L — ABNORMAL LOW (ref 15–41)
Albumin: 4.5 g/dL (ref 3.5–5.0)
Alkaline Phosphatase: 107 U/L (ref 38–126)
Anion gap: 13 (ref 5–15)
BUN: 10 mg/dL (ref 6–20)
CO2: 24 mmol/L (ref 22–32)
Calcium: 9.8 mg/dL (ref 8.9–10.3)
Chloride: 103 mmol/L (ref 98–111)
Creatinine: 0.89 mg/dL (ref 0.44–1.00)
GFR, Estimated: >60 mL/min (ref >60)
Glucose, Bld: 89 mg/dL (ref 70–99)
Potassium: 3.6 mmol/L (ref 3.5–5.1)
Sodium: 140 mmol/L (ref 135–145)
Total Bilirubin: 1.1 mg/dL (ref 0.3–1.2)
Total Protein: 7.3 g/dL (ref 6.5–8.1)

## 2022-01-31 LAB — CBC WITH DIFFERENTIAL (CANCER CENTER ONLY)
Abs Immature Granulocytes: 0.01 10*3/uL (ref 0.00–0.07)
Basophils Absolute: 0 10*3/uL (ref 0.0–0.1)
Basophils Relative: 1 %
Eosinophils Absolute: 0.1 10*3/uL (ref 0.0–0.5)
Eosinophils Relative: 8 %
HCT: 33.4 % — ABNORMAL LOW (ref 36.0–46.0)
Hemoglobin: 11.3 g/dL — ABNORMAL LOW (ref 12.0–15.0)
Immature Granulocytes: 1 %
Lymphocytes Relative: 57 %
Lymphs Abs: 0.9 10*3/uL (ref 0.7–4.0)
MCH: 33.4 pg (ref 26.0–34.0)
MCHC: 33.8 g/dL (ref 30.0–36.0)
MCV: 98.8 fL (ref 80.0–100.0)
Monocytes Absolute: 0.1 10*3/uL (ref 0.1–1.0)
Monocytes Relative: 8 %
Neutro Abs: 0.4 10*3/uL — CL (ref 1.7–7.7)
Neutrophils Relative %: 25 %
Platelet Count: 161 10*3/uL (ref 150–400)
RBC: 3.38 MIL/uL — ABNORMAL LOW (ref 3.87–5.11)
RDW: 15.9 % — ABNORMAL HIGH (ref 11.5–15.5)
Smear Review: NORMAL
WBC Count: 1.6 10*3/uL — ABNORMAL LOW (ref 4.0–10.5)
nRBC: 0 % (ref 0.0–0.2)

## 2022-01-31 LAB — TOTAL PROTEIN, URINE DIPSTICK: Protein, ur: 30 mg/dL — AB

## 2022-01-31 MED ORDER — FILGRASTIM-SNDZ 300 MCG/0.5ML IJ SOSY
300.0000 ug | PREFILLED_SYRINGE | Freq: Once | INTRAMUSCULAR | Status: AC
  Administered 2022-01-31: 300 ug via SUBCUTANEOUS
  Filled 2022-01-31: qty 0.5

## 2022-01-31 MED ORDER — SODIUM CHLORIDE 0.9 % IV SOLN: Freq: Once | INTRAVENOUS | Status: AC

## 2022-01-31 NOTE — Patient Instructions (Signed)
Rehydration, Adult Rehydration is the replacement of body fluids, salts, and minerals (electrolytes) that are lost during dehydration. Dehydration is when there is not enough water or other fluids in the body. This happens when you lose more fluids than you take in. Common causes of dehydration include: Not drinking enough fluids. This can occur when you are ill or doing activities that require a lot of energy, especially in hot weather. Conditions that cause loss of water or other fluids, such as diarrhea, vomiting, sweating, or urinating a lot. Other illnesses, such as fever or infection. Certain medicines, such as those that remove excess fluid from the body (diuretics). Symptoms of mild or moderate dehydration may include thirst, dry lips and mouth, and dizziness. Symptoms of severe dehydration may include increased heart rate, confusion, fainting, and not urinating. For severe dehydration, you may need to get fluids through an IV at the hospital. For mild or moderate dehydration, you can usually rehydrate at home by drinking certain fluids as told by your health care provider. What are the risks? Generally, rehydration is safe. However, taking in too much fluid (overhydration) can be a problem. This is rare. Overhydration can cause an electrolyte imbalance, kidney failure, or a decrease in salt (sodium) levels in the body. Supplies needed You will need an oral rehydration solution (ORS) if your health care provider tells you to use one. This is a drink to treat dehydration. It can be found in pharmacies and retail stores. How to rehydrate Fluids Follow instructions from your health care provider for rehydration. The kind of fluid and the amount you should drink depend on your condition. In general, you should choose drinks that you prefer. If told by your health care provider, drink an ORS. Make an ORS by following instructions on the package. Start by drinking small amounts, about  cup (120  mL) every 5-10 minutes. Slowly increase how much you drink until you have taken the amount recommended by your health care provider. Drink enough clear fluids to keep your urine pale yellow. If you were told to drink an ORS, finish it first, then start slowly drinking other clear fluids. Drink fluids such as: Water. This includes sparkling water and flavored water. Drinking only water can lead to having too little sodium in your body (hyponatremia). Follow the advice of your health care provider. Water from ice chips you suck on. Fruit juice with water you add to it (diluted). Sports drinks. Hot or cold herbal teas. Broth-based soups. Milk or milk products. Food Follow instructions from your health care provider about what to eat while you rehydrate. Your health care provider may recommend that you slowly begin eating regular foods in small amounts. Eat foods that contain a healthy balance of electrolytes, such as bananas, oranges, potatoes, tomatoes, and spinach. Avoid foods that are greasy or contain a lot of sugar. In some cases, you may get nutrition through a feeding tube that is passed through your nose and into your stomach (nasogastric tube, or NG tube). This may be done if you have uncontrolled vomiting or diarrhea. Beverages to avoid  Certain beverages may make dehydration worse. While you rehydrate, avoid drinking alcohol. How to tell if you are recovering from dehydration You may be recovering from dehydration if: You are urinating more often than before you started rehydrating. Your urine is pale yellow. Your energy level improves. You vomit less frequently. You have diarrhea less frequently. Your appetite improves or returns to normal. You feel less dizzy or less light-headed.   Your skin tone and color start to look more normal. Follow these instructions at home: Take over-the-counter and prescription medicines only as told by your health care provider. Do not take sodium  tablets. Doing this can lead to having too much sodium in your body (hypernatremia). Contact a health care provider if: You continue to have symptoms of mild or moderate dehydration, such as: Thirst. Dry lips. Slightly dry mouth. Dizziness. Dark urine or less urine than normal. Muscle cramps. You continue to vomit or have diarrhea. Get help right away if you: Have symptoms of dehydration that get worse. Have a fever. Have a severe headache. Have been vomiting and the following happens: Your vomiting gets worse or does not go away. Your vomit includes blood or green matter (bile). You cannot eat or drink without vomiting. Have problems with urination or bowel movements, such as: Diarrhea that gets worse or does not go away. Blood in your stool (feces). This may cause stool to look black and tarry. Not urinating, or urinating only a small amount of very dark urine, within 6-8 hours. Have trouble breathing. Have symptoms that get worse with treatment. These symptoms may represent a serious problem that is an emergency. Do not wait to see if the symptoms will go away. Get medical help right away. Call your local emergency services (911 in the [place] Do not drive yourself to the hospital. Summary Rehydration is the replacement of body fluids and minerals (electrolytes) that are lost during dehydration. Follow instructions from your health care provider for rehydration. The kind of fluid and amount you should drink depend on your condition. Slowly increase how much you drink until you have taken the amount recommended by your health care provider. Contact your health care provider if you continue to show signs of mild or moderate dehydration. This information is not intended to replace advice given to you by your health care provider. Make sure you discuss any questions you have with your health care provider. Document Revised: 06/23/2019 Document Reviewed: 05/03/2019 Elsevier Patient  Education  2023 [employer]  

## 2022-01-31 NOTE — Progress Notes (Signed)
[facility] [place], [place] [address] [phone]   Interval Evaluation  Date of Service: 01/31/22 Patient Name: [name] Patient MRN: [id number] Patient DOB: [birth year] Provider: Ventura Sellers, MD  Identifying Statement:  [name] is a 56 y.o. female with left temporal glioblastoma   Oncologic History: Oncology History  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[place])  11/21/2020 Initial Diagnosis   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[place])   11/21/2020 Cancer Staging   Staging form: Brain and Spinal Cord, AJCC 8th Edition - Pathologic stage from 11/21/2020: WHO Grade IV - Signed by Ventura Sellers, MD on 11/30/2020 Histopathologic type: Glioblastoma Stage prefix: Initial diagnosis Histologic grading system: 4 grade system Extent of surgical resection: Subtotal resection Karnofsky performance status: Score 90 Seizures at presentation: Present Duration of symptoms before diagnosis: Short IDH1 mutation: Negative   12/27/2020 - 02/07/2021 Radiation Therapy   IMRT and concurrent Temodar Isidore Moos)   03/12/2021 - 08/26/2021 Chemotherapy   Completes 5 cycles of adjuvant 5-day Temodar   08/27/2021 Progression   POD #1   09/06/2021 -  Chemotherapy   Initiates second line therapy; CCNU 59m/m2 PO q6 weeks and avastin 186mkg IV q2 weeks   12/28/2021 Progression   POD #2   01/17/2022 -  Chemotherapy   Third line therapy with Irinotecan and Avastin q2 weeks     Biomarkers:  MGMT Methylated.  IDH 1/2 Wild type.  EGFR Unknown  TERT Unknown   Interval History: [name] RIGAresents today for CPT-11 and avastin infusion.  She did experience severe diarrhea following the irinotecan treatment, this is now resolved.  No other new or progressive complaints, no further seizures.  Leg infection has resolved completely.  Short term memory impairment and fatigue are still  persistent.       H+P (11/25/20) Patient presented to medical attention this past month with first ever seizure.  She describes episode of sudden onset "disconnection" with tongue biting and urination, followed by period of confusion.  CNS imaging demonstrated enhancing mass within left anterior temporal lobe, which was mostly resected by Dr. [name] 11/21/20.  Since surgery she had not had recurrence of seizures.  She complains of painful cramping of her right leg, which started ~1 week prior.  At this time the cramping is her main complaint.  Otherwise fully functional, independent, would like to return to work if possible.  Medications: Current Outpatient Medications on File Prior to Visit  Medication Sig Dispense Refill   amitriptyline (ELAVIL) 50 MG tablet TAKE 1 TABLET BY MOUTH EVERYDAY AT BEDTIME 90 tablet 2   amLODipine (NORVASC) 10 MG tablet TAKE 1 TABLET BY MOUTH EVERY DAY 90 tablet 1   amoxicillin-clavulanate (AUGMENTIN) 875-125 MG tablet Take 1 tablet by mouth every 12 (twelve) hours. 14 tablet 0   baclofen 5 MG TABS Take 5 mg by mouth 3 (three) times daily as needed for muscle spasms. 60 tablet 1   [employer] D3 125 MCG (5000 UT) capsule TAKE 1 CAPSULE (5,000 UNITS TOTAL) BY MOUTH DAILY. 90 capsule 3   dexamethasone (DECADRON) 2 MG tablet Take 1 tablet (2 mg total) by mouth daily. 30 tablet 1   GLEOSTINE 100 MG capsule Take by mouth.     GLEOSTINE 40 MG capsule Take by mouth.     lacosamide (VIMPAT) 50 MG TABS tablet Take 2 tablets (100 mg total) by mouth every morning AND 1 tablet (50 mg total) at  bedtime. 90 tablet 3   levETIRAcetam (KEPPRA) 750 MG tablet Take 2 tablets (1,500 mg total) by mouth 2 (two) times daily. 360 tablet 1   levothyroxine (SYNTHROID) 100 MCG tablet TAKE 1 TABLET BY MOUTH EVERY DAY 90 tablet 3   LORazepam (ATIVAN) 2 MG tablet Take 1 tablet (2 mg total) by mouth every 8 (eight) hours as needed for seizure. 12 tablet 0   ondansetron (ZOFRAN-ODT) 4 MG disintegrating  tablet Take 1 tablet (4 mg total) by mouth every 8 (eight) hours as needed for nausea or vomiting. 10 tablet 0   Potassium Chloride ER 20 MEQ TBCR TAKE 1 TABLET BY MOUTH EVERY DAY 90 tablet 3   prochlorperazine (COMPAZINE) 10 MG tablet Take 1 tablet (10 mg total) by mouth every 6 (six) hours as needed for nausea or vomiting. 30 tablet 0   No current facility-administered medications on file prior to visit.    Allergies: No Known Allergies Past Medical History:  Past Medical History:  Diagnosis Date   Bell's palsy    left side of face- notied in smile and eyes, if very tired   Brain cancer ([place])    Complication of anesthesia    N/V   Essential hypertension    External hemorrhoids    History of blood transfusion    post op Hemorroids   History of pneumonia    Hyperlipidemia    Hyperthyroidism    hx 56 years old- oral meds   Iron deficiency anemia    Migraine headache    Pneumonia    x 2 - years ago   PONV (postoperative nausea and vomiting)    PPD positive    Pulmonary nodules    not seen on plain cxr, first detected 1/07 re ct 10/08 pos IPPD > 15 mm 12/09 FOB/lavage 04/20/08 neg afb smear   PVC's (premature ventricular contractions)    Seizure ([place]) 11/15/2020   Sleep apnea    refused CPAP- mouth piece recommended   Tuberculosis    in her late 30's   Past Surgical History:  Past Surgical History:  Procedure Laterality Date   ABDOMINAL HYSTERECTOMY     APPLICATION OF CRANIAL NAVIGATION N/A 11/21/2020   Procedure: APPLICATION OF CRANIAL NAVIGATION;  Surgeon: Judith Part, MD;  Location: [facility];  Service: Neurosurgery;  Laterality: N/A;   BREAST BIOPSY Right 2014   BREAST BIOPSY Left 10/29/2019   CHOLECYSTECTOMY  2014   COLONOSCOPY  05/04/2009   prolapsing external hemorrhoids   CRANIOTOMY Left 11/21/2020   Procedure: Left craniotomy for tumor resection;  Surgeon: Judith Part, MD;  Location: [facility];  Service: Neurosurgery;  Laterality: Left;   ENDOMETRIAL  ABLATION     HEMORRHOID SURGERY     x 3   TUBAL LIGATION     Social History:  Social History   Socioeconomic History   Marital status: Divorced    Spouse name: Not on file   Number of children: 2   Years of education: Not on file   Highest education level: Associate degree: occupational, Hotel manager, or vocational program  Occupational History   Occupation: accounts Sport and exercise psychologist: [employer]  Tobacco Use   Smoking status: Never   Smokeless tobacco: Never  Vaping Use   Vaping Use: Never used  Substance and Sexual Activity   Alcohol use: No    Alcohol/week: 0.0 standard drinks of alcohol   Drug use: No   Sexual activity: Not on file  Other Topics Concern  Not on file  Social History Narrative   Divorced with 2 children   She works as a Librarian, 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 (after nodules found)   From [place] lived there and [place]   Never owned birds      Social Determinants of Health   Financial Resource Strain: Medium Risk (06/13/2021)   Overall Financial Resource Strain (CARDIA)    Difficulty of Paying Living Expenses: Somewhat hard  Food Insecurity: Food Insecurity Present (04/23/2021)   Hunger Vital Sign    Worried About Running Out of Food in the Last Year: Sometimes true    Ran Out of Food in the Last Year: Never true  Transportation Needs: No Transportation Needs (06/13/2021)   PRAPARE - Hydrologist (Medical): No    Lack of Transportation (Non-Medical): No  Physical Activity: Unknown (04/23/2021)   Exercise Vital Sign    Days of Exercise per Week: 0 days    Minutes of Exercise per Session: Not on file  Stress: No Stress Concern Present (04/23/2021)   [place]    Feeling of Stress : Only a little  Social Connections: Moderately Integrated (04/23/2021)   Social Connection and Isolation Panel [NHANES]    Frequency of Communication with Friends and  Family: Twice a week    Frequency of Social Gatherings with Friends and Family: Twice a week    Attends Religious Services: More than 4 times per year    Active Member of [employer] or Organizations: Yes    Attends Music therapist: More than 4 times per year    Marital Status: Divorced  Human resources officer Violence: Not on file   Family History:  Family History  Problem Relation Age of Onset   Diabetes type II Mother    Kidney failure Sister    Diabetes Mellitus II Sister    Colon cancer Maternal Aunt        aunts and uncles   Diabetes Mellitus II Maternal Aunt    Pancreatic cancer Maternal Aunt    Colon cancer Maternal Uncle    Diabetes Mellitus II Maternal Uncle    Heart disease Maternal Grandmother        aunts, uncles, sister   Diabetes Mellitus II Maternal Grandmother    Lung cancer Other        aunts and uncles    Review of Systems: Constitutional: poor appetite Eyes: blurriness of vision Ears, nose, mouth, throat, and face: Doesn't report sore throat Respiratory: Doesn't report cough, dyspnea or wheezes Cardiovascular: Doesn't report palpitation, chest discomfort  Gastrointestinal:  Doesn't report nausea, constipation, diarrhea GU: Doesn't report incontinence Skin: Doesn't report skin rashes Neurological: Per HPI Musculoskeletal: bilateral shoulder pain Behavioral/Psych: +anxiety  Physical Exam: Vitals:   01/31/22 1021  BP: 110/73  Pulse: 73  Resp: 17  Temp: 98 F (36.7 C)  SpO2: 99%    KPS: 70. General: Alert, cooperative, pleasant, in no acute distress Head: Normal EENT: No conjunctival injection or scleral icterus.  Lungs: Resp effort normal Cardiac: Regular rate Abdomen: Non-distended abdomen Skin: No rashes cyanosis or petechiae. Extremities: No clubbing or edema  Neurologic Exam: Mental Status: Awake, alert, attentive to examiner. Oriented to self and environment. Language is notable for modest impairment in fluency.  Impaired short  term recall. Cranial Nerves: Visual acuity is grossly normal. Visual fields are full. Extra-ocular movements intact. No ptosis. Face L LMN paresis, chronic. Motor: Tone and  bulk are normal. Power is full in both arms and legs. Reflexes are symmetric, no pathologic reflexes present.  Sensory: Intact to light touch Gait: Normal.   Labs: I have reviewed the data as listed    Component Value Date/Time   NA 140 01/20/2022 0326   K 4.0 01/20/2022 0326   CL 105 01/20/2022 0326   CO2 24 01/20/2022 0326   GLUCOSE 114 (H) 01/20/2022 0326   BUN 21 (H) 01/20/2022 0326   CREATININE 1.03 (H) 01/20/2022 0326   CREATININE 1.07 (H) 01/17/2022 0937   CREATININE 0.92 01/25/2020 1424   CALCIUM 10.5 (H) 01/20/2022 0326   PROT 7.2 01/20/2022 0326   ALBUMIN 4.7 01/20/2022 0326   AST 13 (L) 01/20/2022 0326   AST 13 (L) 01/17/2022 0937   ALT 12 01/20/2022 0326   ALT 9 01/17/2022 0937   ALKPHOS 105 01/20/2022 0326   BILITOT 2.3 (H) 01/20/2022 0326   BILITOT 1.1 01/17/2022 0937   GFRNONAA >60 01/20/2022 0326   GFRNONAA >60 01/17/2022 0937   GFRNONAA 72 01/25/2020 1424   GFRAA 83 01/25/2020 1424   Lab Results  Component Value Date   WBC 10.1 01/20/2022   NEUTROABS 8.6 (H) 01/20/2022   HGB 10.6 (L) 01/20/2022   HCT 31.6 (L) 01/20/2022   MCV 97.8 01/20/2022   PLT 180 01/20/2022   Assessment/Plan Focal seizures ([place]) [R56.9]  [name] is clinically stable today.  Labs demonstrate recurrence of cytopenias, neutropenia 2/2 cytotoxic chemotherapy.  We will defer chemo and avastin today, push out to next week.  Zarxio will be given for bone marrow support as well as 1L NS IV hydration.  Chemotherapy should be held for the following:  ANC less than 1,000  Platelets less than 100,000  LFT or creatinine greater than 2x ULN  If clinical concerns/contraindications develop  Avastin should be held for the following:  ANC less than 500  Platelets less than 50,000  LFT or creatinine  greater than 2x ULN  If clinical concerns/contraindications develop  For seizures, will con't Vimpat 100/50 and con't Keppra 1532m BID.    Will start decadron 238mdaily for appetite, nausea as discussed.  Should continue to dose 2034mpotassium daily.  She is no longer taking baclofen or elavil, ok to remain off of those.  We ask that TijAYONA YNIGUEZturn to clinic in 1 weeks for next infusion of CPT-11, Avastin and labs.  Zarxio will be given again next Friday.   All questions were answered. The patient knows to call the clinic with any problems, questions or concerns. No barriers to learning were detected.  The total time spent in the encounter was 40 minutes and more than 50% was on counseling and review of test results   ZacVentura SellersD Medical Director of Neuro-Oncology [facility] Wes[place]/28/23 10:17 AM

## 2022-01-31 NOTE — Progress Notes (Signed)
[facility] CSW Progress Note  Holiday representative met with patient to provide program information.  Patient was receiving her infusion and appeared drowsy.  She stated she was very tired.  Discussed her current insurance she receives through work.  She stated she will begin receiving long-term disability beginning November 1st, 2023.  Also discussed the [employer] which CSW made a referral to Patient Arboriculturist at [facility].  Gave patient CSW contact information and Banker.  Patient expressed no other needs.    Rodman Pickle Aashvi Rezabek, LCSW

## 2022-01-31 NOTE — Addendum Note (Signed)
Addended by: Margaret Pyle on: 01/31/2022 04:40 PM   Modules accepted: Orders

## 2022-01-31 NOTE — Progress Notes (Unsigned)
CRITICAL VALUE STICKER  CRITICAL VALUE: ANC  DATE & TIME NOTIFIED: 01/31/22 10:55  MESSENGER (representative from lab): Lelan Pons  MD NOTIFIED: Mickeal Skinner  TIME OF NOTIFICATION:11:00

## 2022-02-01 ENCOUNTER — Other Ambulatory Visit: Payer: Self-pay

## 2022-02-01 ENCOUNTER — Other Ambulatory Visit: Payer: Self-pay | Admitting: Adult Health

## 2022-02-01 ENCOUNTER — Encounter: Payer: Self-pay | Admitting: Hematology

## 2022-02-01 MED ORDER — LEVOTHYROXINE SODIUM 100 MCG PO TABS: 100.0000 ug | ORAL_TABLET | Freq: Every day | ORAL | 3 refills | Status: DC

## 2022-02-01 NOTE — Progress Notes (Signed)
Received referral from social worker regarding patient to apply for [employer].  Called patient to discuss what was discussed back in June regarding [employer]. Patient has had a reduction in income which qualifies her for grant. She states her next appointment is next week which is not showing on my end. She will confirm her appointment and present documentation at next appointment and complete grant paperwork. Advised her to contact me at earliest convenience after appointment to discuss grant expenses in detail. Did discuss gas card if she needed at that visit. She will be given my card again with my contact name and number.

## 2022-02-06 MED FILL — Dexamethasone Sodium Phosphate Inj 100 MG/10ML: INTRAMUSCULAR | Qty: 1 | Status: AC

## 2022-02-07 ENCOUNTER — Inpatient Hospital Stay: Payer: BC Managed Care – PPO

## 2022-02-07 ENCOUNTER — Inpatient Hospital Stay (HOSPITAL_BASED_OUTPATIENT_CLINIC_OR_DEPARTMENT_OTHER): Payer: BC Managed Care – PPO | Admitting: Internal Medicine

## 2022-02-07 ENCOUNTER — Inpatient Hospital Stay: Payer: BC Managed Care – PPO | Attending: Internal Medicine

## 2022-02-07 VITALS — BP 124/70 | HR 56 | Resp 16

## 2022-02-07 DIAGNOSIS — C719 Malignant neoplasm of brain, unspecified: Secondary | ICD-10-CM | POA: Diagnosis not present

## 2022-02-07 DIAGNOSIS — Z5111 Encounter for antineoplastic chemotherapy: Secondary | ICD-10-CM | POA: Diagnosis not present

## 2022-02-07 DIAGNOSIS — Z5189 Encounter for other specified aftercare: Secondary | ICD-10-CM | POA: Diagnosis not present

## 2022-02-07 DIAGNOSIS — D701 Agranulocytosis secondary to cancer chemotherapy: Secondary | ICD-10-CM | POA: Diagnosis not present

## 2022-02-07 DIAGNOSIS — C712 Malignant neoplasm of temporal lobe: Secondary | ICD-10-CM | POA: Diagnosis not present

## 2022-02-07 LAB — CBC WITH DIFFERENTIAL (CANCER CENTER ONLY)
Abs Immature Granulocytes: 0.13 10*3/uL — ABNORMAL HIGH (ref 0.00–0.07)
Basophils Absolute: 0 10*3/uL (ref 0.0–0.1)
Basophils Relative: 0 %
Eosinophils Absolute: 0.1 10*3/uL (ref 0.0–0.5)
Eosinophils Relative: 2 %
HCT: 33.4 % — ABNORMAL LOW (ref 36.0–46.0)
Hemoglobin: 11.1 g/dL — ABNORMAL LOW (ref 12.0–15.0)
Immature Granulocytes: 4 %
Lymphocytes Relative: 32 %
Lymphs Abs: 1.1 10*3/uL (ref 0.7–4.0)
MCH: 33.5 pg (ref 26.0–34.0)
MCHC: 33.2 g/dL (ref 30.0–36.0)
MCV: 100.9 fL — ABNORMAL HIGH (ref 80.0–100.0)
Monocytes Absolute: 0.7 10*3/uL (ref 0.1–1.0)
Monocytes Relative: 19 %
Neutro Abs: 1.4 10*3/uL — ABNORMAL LOW (ref 1.7–7.7)
Neutrophils Relative %: 43 %
Platelet Count: 159 10*3/uL (ref 150–400)
RBC: 3.31 MIL/uL — ABNORMAL LOW (ref 3.87–5.11)
RDW: 16.2 % — ABNORMAL HIGH (ref 11.5–15.5)
WBC Count: 3.4 10*3/uL — ABNORMAL LOW (ref 4.0–10.5)
nRBC: 1.5 % — ABNORMAL HIGH (ref 0.0–0.2)

## 2022-02-07 LAB — CMP (CANCER CENTER ONLY)
ALT: 12 U/L (ref 0–44)
AST: 11 U/L — ABNORMAL LOW (ref 15–41)
Albumin: 4.3 g/dL (ref 3.5–5.0)
Alkaline Phosphatase: 99 U/L (ref 38–126)
Anion gap: 7 (ref 5–15)
BUN: 14 mg/dL (ref 6–20)
CO2: 27 mmol/L (ref 22–32)
Calcium: 9.3 mg/dL (ref 8.9–10.3)
Chloride: 105 mmol/L (ref 98–111)
Creatinine: 0.8 mg/dL (ref 0.44–1.00)
GFR, Estimated: >60 mL/min
Glucose, Bld: 99 mg/dL (ref 70–99)
Potassium: 3.5 mmol/L (ref 3.5–5.1)
Sodium: 139 mmol/L (ref 135–145)
Total Bilirubin: 0.7 mg/dL (ref 0.3–1.2)
Total Protein: 7 g/dL (ref 6.5–8.1)

## 2022-02-07 LAB — TOTAL PROTEIN, URINE DIPSTICK: Protein, ur: <30 mg/dL — AB

## 2022-02-07 MED ORDER — PALONOSETRON HCL INJECTION 0.25 MG/5ML
0.2500 mg | Freq: Once | INTRAVENOUS | Status: AC
  Administered 2022-02-07: 0.25 mg via INTRAVENOUS
  Filled 2022-02-07: qty 5

## 2022-02-07 MED ORDER — SODIUM CHLORIDE 0.9 % IV SOLN: Freq: Once | INTRAVENOUS | Status: AC

## 2022-02-07 MED ORDER — IRINOTECAN HCL CHEMO INJECTION 100 MG/5ML
125.0000 mg/m2 | Freq: Once | INTRAVENOUS | Status: AC
  Administered 2022-02-07: 240 mg via INTRAVENOUS
  Filled 2022-02-07: qty 12

## 2022-02-07 MED ORDER — ATROPINE SULFATE 1 MG/ML IV SOLN
0.5000 mg | Freq: Once | INTRAVENOUS | Status: AC | PRN
  Administered 2022-02-07: 0.5 mg via INTRAVENOUS
  Filled 2022-02-07: qty 1

## 2022-02-07 MED ORDER — SODIUM CHLORIDE 0.9 % IV SOLN
10.0000 mg/kg | Freq: Once | INTRAVENOUS | Status: AC
  Administered 2022-02-07: 700 mg via INTRAVENOUS
  Filled 2022-02-07: qty 16

## 2022-02-07 MED ORDER — SODIUM CHLORIDE 0.9 % IV SOLN
10.0000 mg | Freq: Once | INTRAVENOUS | Status: AC
  Administered 2022-02-07: 10 mg via INTRAVENOUS
  Filled 2022-02-07: qty 1
  Filled 2022-02-07: qty 10

## 2022-02-07 NOTE — Progress Notes (Signed)
 [facility] [address]  , [place] [address] [phone]   Interval Evaluation  Date of Service: 02/07/22 Patient Name: [name] Patient MRN: [id number] Patient DOB: 08/27/1965 Provider: Zachary K Vaslow, MD  Identifying Statement:  [name] is a 56 y.o. 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)   11/21/2020 Cancer Staging   Staging form: Brain and Spinal Cord, AJCC 8th Edition - Pathologic stage from 11/21/2020: WHO Grade IV - Signed by Vaslow, Zachary K, MD on 11/30/2020 Histopathologic type: Glioblastoma Stage prefix: Initial diagnosis Histologic grading system: 4 grade system Extent of surgical resection: Subtotal resection Karnofsky performance status: Score 90 Seizures at presentation: Present Duration of symptoms before diagnosis: Short IDH1 mutation: Negative   12/27/2020 - 02/07/2021 Radiation Therapy   IMRT and concurrent Temodar ([place])   03/12/2021 - 08/26/2021 Chemotherapy   Completes 5 cycles of adjuvant 5-day Temodar   08/27/2021 Progression   POD #1   09/06/2021 -  Chemotherapy   Initiates second line therapy; CCNU 90mg/m2 PO q6 weeks and avastin 10mg/kg IV q2 weeks   12/28/2021 Progression   POD #2   01/17/2022 -  Chemotherapy   Third line therapy with Irinotecan and Avastin q2 weeks     Biomarkers:  MGMT Methylated.  IDH 1/2 Wild type.  EGFR Unknown  TERT Unknown   Interval History: [name] presents today for CPT-11 and avastin infusion.  Feels stable compared to last week's visit, no recurrence of diarrhea.  No other new or progressive complaints, no further seizures.  Short term memory impairment and fatigue are still persistent.       H+P (11/25/20) Patient presented to medical  attention this past month with first ever seizure.  She describes episode of sudden onset "disconnection" with tongue biting and urination, followed by period of confusion.  CNS imaging demonstrated enhancing mass within left anterior temporal lobe, which was mostly resected by Dr. Ostergard on 11/21/20.  Since surgery she had not had recurrence of seizures.  She complains of painful cramping of her right leg, which started ~1 week prior.  At this time the cramping is her main complaint.  Otherwise fully functional, independent, would like to return to work if possible.  Medications: Current Outpatient Medications on File Prior to Visit  Medication Sig Dispense Refill   amLODipine (NORVASC) 10 MG tablet TAKE 1 TABLET BY MOUTH EVERY DAY 90 tablet 1   dexamethasone (DECADRON) 2 MG tablet Take 1 tablet (2 mg total) by mouth daily. 30 tablet 1   lacosamide (VIMPAT) 50 MG TABS tablet Take 2 tablets (100 mg total) by mouth every morning AND 1 tablet (50 mg total) at bedtime. 90 tablet 3   levETIRAcetam (KEPPRA) 750 MG tablet Take 2 tablets (1,500 mg total) by mouth 2 (two) times daily. 360 tablet 1   levothyroxine (SYNTHROID) 100 MCG tablet Take 1 tablet (100 mcg total) by mouth daily. 90 tablet 3   LORazepam (ATIVAN) 2 MG tablet Take 1 tablet (2 mg total) by mouth every 8 (eight) hours as needed for seizure. (Patient not taking: Reported on 01/31/2022) 12 tablet 0   ondansetron (ZOFRAN-ODT) 4 MG disintegrating tablet Take 1 tablet (4 mg total) by mouth every 8 (eight) hours as needed for nausea or vomiting. (Patient not taking: Reported on 01/31/2022) 10 tablet   0   Potassium Chloride ER 20 MEQ TBCR TAKE 1 TABLET BY MOUTH EVERY DAY 90 tablet 3   No current facility-administered medications on file prior to visit.    Allergies: No Known Allergies Past Medical History:  Past Medical History:  Diagnosis Date   Bell's palsy    left side of face- notied in smile and eyes, if very tired   Brain cancer ([place])     Complication of anesthesia    N/V   Essential hypertension    External hemorrhoids    History of blood transfusion    post op Hemorroids   History of pneumonia    Hyperlipidemia    Hyperthyroidism    hx 56 years old- oral meds   Iron deficiency anemia    Migraine headache    Pneumonia    x 2 - years ago   PONV (postoperative nausea and vomiting)    PPD positive    Pulmonary nodules    not seen on plain cxr, first detected 1/07 re ct 10/08 pos IPPD > 15 mm 12/09 FOB/lavage 04/20/08 neg afb smear   PVC's (premature ventricular contractions)    Seizure ([place]) 11/15/2020   Sleep apnea    refused CPAP- mouth piece recommended   Tuberculosis    in her late 30's   Past Surgical History:  Past Surgical History:  Procedure Laterality Date   ABDOMINAL HYSTERECTOMY     APPLICATION OF CRANIAL NAVIGATION N/A 11/21/2020   Procedure: APPLICATION OF CRANIAL NAVIGATION;  Surgeon: Judith Part, MD;  Location: [facility];  Service: Neurosurgery;  Laterality: N/A;   BREAST BIOPSY Right 2014   BREAST BIOPSY Left 10/29/2019   CHOLECYSTECTOMY  2014   COLONOSCOPY  05/04/2009   prolapsing external hemorrhoids   CRANIOTOMY Left 11/21/2020   Procedure: Left craniotomy for tumor resection;  Surgeon: Judith Part, MD;  Location: [facility];  Service: Neurosurgery;  Laterality: Left;   ENDOMETRIAL ABLATION     HEMORRHOID SURGERY     x 3   TUBAL LIGATION     Social History:  Social History   Socioeconomic History   Marital status: Divorced    Spouse name: Not on file   Number of children: 2   Years of education: Not on file   Highest education level: Associate degree: occupational, Hotel manager, or vocational program  Occupational History   Occupation: accounts Sport and exercise psychologist: [employer]  Tobacco Use   Smoking status: Never   Smokeless tobacco: Never  Vaping Use   Vaping Use: Never used  Substance and Sexual Activity   Alcohol use: No    Alcohol/week: 0.0 standard drinks of  alcohol   Drug use: No   Sexual activity: Not on file  Other Topics Concern   Not on file  Social History Narrative   Divorced with 2 children   She works as a Librarian, 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Press [employer]    Possible TB exposure 2008 (after nodules found)   From [place] lived there and [place]   Never owned birds      Social Determinants of Health   Financial Resource Strain: [place] (06/13/2021)   Overall Financial Resource Strain (CARDIA)    Difficulty of Paying Living Expenses: Somewhat hard  Food Insecurity: Food Insecurity Present (04/23/2021)   Hunger Vital Sign    Worried About Running Out of Food in the Last Year: Sometimes true    Ran Out of Food in the Last Year: Never true  Transportation Needs: No Transportation  Needs (06/13/2021)   PRAPARE - Hydrologist (Medical): No    Lack of Transportation (Non-Medical): No  Physical Activity: Unknown (04/23/2021)   Exercise Vital Sign    Days of Exercise per Week: 0 days    Minutes of Exercise per Session: Not on file  Stress: No Stress Concern Present (04/23/2021)   [place]    Feeling of Stress : Only a little  Social Connections: Moderately Integrated (04/23/2021)   Social Connection and Isolation Panel [NHANES]    Frequency of Communication with Friends and Family: Twice a week    Frequency of Social Gatherings with Friends and Family: Twice a week    Attends Religious Services: More than 4 times per year    Active Member of [employer] or Organizations: Yes    Attends Music therapist: More than 4 times per year    Marital Status: Divorced  Human resources officer Violence: Not on file   Family History:  Family History  Problem Relation Age of Onset   Diabetes type II Mother    Kidney failure Sister    Diabetes Mellitus II Sister    Colon cancer Maternal Aunt        aunts and uncles   Diabetes Mellitus II Maternal Aunt    Pancreatic cancer  Maternal Aunt    Colon cancer Maternal Uncle    Diabetes Mellitus II Maternal Uncle    Heart disease Maternal Grandmother        aunts, uncles, sister   Diabetes Mellitus II Maternal Grandmother    Lung cancer Other        aunts and uncles    Review of Systems: Constitutional: poor appetite Eyes: blurriness of vision Ears, nose, mouth, throat, and face: Doesn't report sore throat Respiratory: Doesn't report cough, dyspnea or wheezes Cardiovascular: Doesn't report palpitation, chest discomfort  Gastrointestinal:  Doesn't report nausea, constipation, diarrhea GU: Doesn't report incontinence Skin: Doesn't report skin rashes Neurological: Per HPI Musculoskeletal: bilateral shoulder pain Behavioral/Psych: +anxiety  Physical Exam: Vitals:   02/07/22 1140  BP: (!) 144/85  Pulse: 76  Resp: 16  Temp: 97.8 F (36.6 C)  SpO2: 100%    KPS: 70. General: Alert, cooperative, pleasant, in no acute distress Head: Normal EENT: No conjunctival injection or scleral icterus.  Lungs: Resp effort normal Cardiac: Regular rate Abdomen: Non-distended abdomen Skin: No rashes cyanosis or petechiae. Extremities: No clubbing or edema  Neurologic Exam: Mental Status: Awake, alert, attentive to examiner. Oriented to self and environment. Language is notable for modest impairment in fluency.  Impaired short term recall. Cranial Nerves: Visual acuity is grossly normal. Visual fields are full. Extra-ocular movements intact. No ptosis. Face L LMN paresis, chronic. Motor: Tone and bulk are normal. Power is full in both arms and legs. Reflexes are symmetric, no pathologic reflexes present.  Sensory: Intact to light touch Gait: Normal.   Labs: I have reviewed the data as listed    Component Value Date/Time   NA 140 01/31/2022 1013   K 3.6 01/31/2022 1013   CL 103 01/31/2022 1013   CO2 24 01/31/2022 1013   GLUCOSE 89 01/31/2022 1013   BUN 10 01/31/2022 1013   CREATININE 0.89 01/31/2022 1013    CREATININE 0.92 01/25/2020 1424   CALCIUM 9.8 01/31/2022 1013   PROT 7.3 01/31/2022 1013   ALBUMIN 4.5 01/31/2022 1013   AST 12 (L) 01/31/2022 1013   ALT 11 01/31/2022 1013  ALKPHOS 107 01/31/2022 1013   BILITOT 1.1 01/31/2022 1013   GFRNONAA >60 01/31/2022 1013   GFRNONAA 72 01/25/2020 1424   GFRAA 83 01/25/2020 1424   Lab Results  Component Value Date   WBC 3.4 (L) 02/07/2022   NEUTROABS 1.4 (L) 02/07/2022   HGB 11.1 (L) 02/07/2022   HCT 33.4 (L) 02/07/2022   MCV 100.9 (H) 02/07/2022   PLT 159 02/07/2022   Assessment/Plan No primary diagnosis found.  [name] is clinically stable today.  Labs demonstrate recurrence of cytopenias, neutropenia 2/2 cytotoxic chemotherapy.  We will proceed with Irinotecan and avastin today, treatment #2.  Zarxio will be given for bone marrow support tomorrow, once s/p 24h from infusion.  Chemotherapy should be held for the following:  ANC less than 1,000  Platelets less than 100,000  LFT or creatinine greater than 2x ULN  If clinical concerns/contraindications develop  Avastin should be held for the following:  ANC less than 500  Platelets less than 50,000  LFT or creatinine greater than 2x ULN  If clinical concerns/contraindications develop  For seizures, will con't Vimpat 100/50 and con't Keppra 1500mg BID.    Will con't decadron 2mg daily for now.  Should continue to dose 20meq potassium daily.  We ask that [name] return to clinic in 2 weeks for next infusion of CPT-11, Avastin and labs.  Zarxio will be given again tomorrow.  All questions were answered. The patient knows to call the clinic with any problems, questions or concerns. No barriers to learning were detected.  The total time spent in the encounter was 30 minutes and more than 50% was on counseling and review of test results   Zachary K Vaslow, MD Medical Director of Neuro-Oncology [facility] 02/07/22 11:45  AM 

## 2022-02-07 NOTE — Patient Instructions (Signed)
[facility] MEDICAL [facility]COLOG[facility]  Discharge Instructions: Thank you for choosing [facility] to provide your oncology and hematology care.   If you have a lab appointment with the Cancer Center, please go directly to the Cancer Center and check in at the registration area.   Wear comfortable clothing and clothing appropriate for easy access to any Portacath or PICC line.   We strive to give you quality time with your provider. You may need to reschedule your appointment if you arrive late (15 or more minutes).  Arriving late affects you and other patients whose appointments are after yours.  Also, if you miss three or more appointments without notifying the office, you may be dismissed from the clinic at the provider's discretion.      For prescription refill requests, have your pharmacy contact our office and allow 72 hours for refills to be completed.    Today you received the following chemotherapy and/or immunotherapy agents: Bevacizumab, Irinotecan.       To help prevent nausea and vomiting after your treatment, we encourage you to take your nausea medication as directed.  BELOW ARE SYMPTOMS THAT SHOULD BE REPORTED IMMEDIATELY: *FEVER GREATER THAN 100.4 F (38 C) OR HIGHER *CHILLS OR SWEATING *NAUSEA AND VOMITING THAT IS NOT CONTROLLED WITH YOUR NAUSEA MEDICATION *UNUSUAL SHORTNESS OF BREATH *UNUSUAL BRUISING OR BLEEDING *URINARY PROBLEMS (pain or burning when urinating, or frequent urination) *BOWEL PROBLEMS (unusual diarrhea, constipation, pain near the anus) TENDERNESS IN MOUTH AND THROAT WITH OR WITHOUT PRESENCE OF ULCERS (sore throat, sores in mouth, or a toothache) UNUSUAL RASH, SWELLING OR PAIN  UNUSUAL VAGINAL DISCHARGE OR ITCHING   Items with * indicate a potential emergency and should be followed up as soon as possible or go to the Emergency Department if any problems should occur.  Please show the CHEMOTHERAPY ALERT CARD or IMMUNOTHERAPY ALERT CARD  at check-in to the Emergency Department and triage nurse.  Should you have questions after your visit or need to cancel or reschedule your appointment, please contact [facility]  Dept: [phone]  and follow the prompts.  Office hours are 8:00 a.m. to 4:30 p.m. Monday - Friday. Please note that voicemails left after 4:00 p.m. may not be returned until the following business day.  We are closed weekends and major holidays. You have access to a nurse at all times for urgent questions. Please call the main number to the clinic Dept: [phone] and follow the prompts.   For any non-urgent questions, you may also contact your provider using MyChart. We now offer e-Visits for anyone 18 and older to request care online for non-urgent symptoms. For details visit [email or web address].   Also download the MyChart app! Go to the app store, search "MyChart", open the app, select [facility], and log in with your MyChart username and password.  Masks are optional in the cancer centers. If you would like for your care team to wear a mask while they are taking care of you, please let them know. You may have one support person who is at least 54 years old accompany you for your appointments. Irinotecan Injection What is this medication? IRINOTECAN (ir in oh TEE kan) treats some types of cancer. It works by slowing down the growth of cancer cells. This medicine may be used for other purposes; ask your health care provider or pharmacist if you have questions. COMMON BRAND NAME(S): Camptosar What should I tell my care team before I take   this medication? They need to know if you have any of these conditions: Dehydration Diarrhea Infection, especially a viral infection, such as chickenpox, cold sores, herpes Liver disease Low blood cell levels (white cells, red cells, and platelets) Low levels of electrolytes, such as calcium, magnesium, or potassium in your blood Recent or  ongoing radiation An unusual or allergic reaction to irinotecan, other medications, foods, dyes, or preservatives If you or your partner are pregnant or trying to get pregnant Breast-feeding How should I use this medication? This medication is injected into a vein. It is given by your care team in a hospital or clinic setting. Talk to your care team about the use of this medication in children. Special care may be needed. Overdosage: If you think you have taken too much of this medicine contact a poison control center or emergency room at once. NOTE: This medicine is only for you. Do not share this medicine with others. What if I miss a dose? Keep appointments for follow-up doses. It is important not to miss your dose. Call your care team if you are unable to keep an appointment. What may interact with this medication? Do not take this medication with any of the following: Cobicistat Itraconazole This medication may also interact with the following: Certain antibiotics, such as clarithromycin, rifampin, rifabutin Certain antivirals for HIV or AIDS Certain medications for fungal infections, such as ketoconazole, posaconazole, voriconazole Certain medications for seizures, such as carbamazepine, phenobarbital, phenytoin Gemfibrozil Nefazodone St. John's wort This list may not describe all possible interactions. Give your health care provider a list of all the medicines, herbs, non-prescription drugs, or dietary supplements you use. Also tell them if you smoke, drink alcohol, or use illegal drugs. Some items may interact with your medicine. What should I watch for while using this medication? Your condition will be monitored carefully while you are receiving this medication. You may need blood work while taking this medication. This medication may make you feel generally unwell. This is not uncommon as chemotherapy can affect healthy cells as well as cancer cells. Report any side effects.  Continue your course of treatment even though you feel ill unless your care team tells you to stop. This medication can cause serious side effects. To reduce the risk, your care team may give you other medications to take before receiving this one. Be sure to follow the directions from your care team. This medication may affect your coordination, reaction time, or judgement. Do not drive or operate machinery until you know how this medication affects you. Sit up or stand slowly to reduce the risk of dizzy or fainting spells. Drinking alcohol with this medication can increase the risk of these side effects. This medication may increase your risk of getting an infection. Call your care team for advice if you get a fever, chills, sore throat, or other symptoms of a cold or flu. Do not treat yourself. Try to avoid being around people who are sick. Avoid taking medications that contain aspirin, acetaminophen, ibuprofen, naproxen, or ketoprofen unless instructed by your care team. These medications may hide a fever. This medication may increase your risk to bruise or bleed. Call your care team if you notice any unusual bleeding. Be careful brushing or flossing your teeth or using a toothpick because you may get an infection or bleed more easily. If you have any dental work done, tell your dentist you are receiving this medication. Talk to your care team if you or your partner are pregnant   or think either of you might be pregnant. This medication can cause serious birth defects if taken during pregnancy and for 6 months after the last dose. You will need a negative pregnancy test before starting this medication. Contraception is recommended while taking this medication and for 6 months after the last dose. Your care team can help you find the option that works for you. Do not father a child while taking this medication and for 3 months after the last dose. Use a condom for contraception during this time period. Do  not breastfeed while taking this medication and for 7 days after the last dose. This medication may cause infertility. Talk to your care team if you are concerned about your fertility. What side effects may I notice from receiving this medication? Side effects that you should report to your care team as soon as possible: Allergic reactions--skin rash, itching, hives, swelling of the face, lips, tongue, or throat Dry cough, shortness of breath or trouble breathing Increased saliva or tears, increased sweating, stomach cramping, diarrhea, small pupils, unusual weakness or fatigue, slow heartbeat Infection--fever, chills, cough, sore throat, wounds that don't heal, pain or trouble when passing urine, general feeling of discomfort or being unwell Kidney injury--decrease in the amount of urine, swelling of the ankles, hands, or feet Low red blood cell level--unusual weakness or fatigue, dizziness, headache, trouble breathing Severe or prolonged diarrhea Unusual bruising or bleeding Side effects that usually do not require medical attention (report to your care team if they continue or are bothersome): Constipation Diarrhea Hair loss Loss of appetite Nausea Stomach pain This list may not describe all possible side effects. Call your doctor for medical advice about side effects. You may report side effects to FDA at 1-800-FDA-1088. Where should I keep my medication? This medication is given in a hospital or clinic. It will not be stored at home. NOTE: This sheet is a summary. It may not cover all possible information. If you have questions about this medicine, talk to your doctor, pharmacist, or health care provider.  2023 Elsevier/Gold Standard (2021-08-30 00:00:00) 

## 2022-02-07 NOTE — Progress Notes (Signed)
Per Dr. Mickeal Skinner ok to proceed with tx with Kerr 1.4.

## 2022-02-08 ENCOUNTER — Inpatient Hospital Stay: Payer: BC Managed Care – PPO

## 2022-02-08 VITALS — BP 129/86 | HR 69 | Temp 98.5°F | Resp 18

## 2022-02-08 DIAGNOSIS — Z5189 Encounter for other specified aftercare: Secondary | ICD-10-CM | POA: Diagnosis not present

## 2022-02-08 DIAGNOSIS — Z5111 Encounter for antineoplastic chemotherapy: Secondary | ICD-10-CM | POA: Diagnosis not present

## 2022-02-08 DIAGNOSIS — C712 Malignant neoplasm of temporal lobe: Secondary | ICD-10-CM | POA: Diagnosis not present

## 2022-02-08 DIAGNOSIS — D701 Agranulocytosis secondary to cancer chemotherapy: Secondary | ICD-10-CM | POA: Diagnosis not present

## 2022-02-08 DIAGNOSIS — T451X5A Adverse effect of antineoplastic and immunosuppressive drugs, initial encounter: Secondary | ICD-10-CM

## 2022-02-08 MED ORDER — FILGRASTIM-SNDZ 300 MCG/0.5ML IJ SOSY
300.0000 ug | PREFILLED_SYRINGE | Freq: Once | INTRAMUSCULAR | Status: AC
  Administered 2022-02-08: 300 ug via SUBCUTANEOUS
  Filled 2022-02-08: qty 0.5

## 2022-02-13 ENCOUNTER — Telehealth: Payer: Self-pay

## 2022-02-13 NOTE — Telephone Encounter (Signed)
This nurse reached back out to patient and made her aware that MD suggested to try Miralax.  Patient states that she will try because she is miserable.  Patient also states that she would like the doctor to change her treatment because she cannot tolerate the constipation and will speak with him at her next appointment. Patient also request a follow up call from the nurse on tomorrow. No further questions or concerns at this time.

## 2022-02-13 NOTE — Telephone Encounter (Signed)
This nurse received a call from this patient stating that she is having severe constipation.  This has caused her appetite to decrease as well.  Patient would like to know if the provider will be ok with her taking Ex-lax or if he has another recommendation.  No further questions or concerns at this time.

## 2022-02-14 ENCOUNTER — Telehealth: Payer: Self-pay | Admitting: *Deleted

## 2022-02-14 NOTE — Telephone Encounter (Signed)
Spoke with patient she was successful in producing bowel movement and feels better in that regards.  She also states she still doesn't have much of an appetite but is eating several meals a day.

## 2022-02-16 ENCOUNTER — Emergency Department (HOSPITAL_COMMUNITY): Payer: BC Managed Care – PPO

## 2022-02-16 ENCOUNTER — Other Ambulatory Visit: Payer: Self-pay

## 2022-02-16 ENCOUNTER — Emergency Department (HOSPITAL_COMMUNITY)
Admission: EM | Admit: 2022-02-16 | Discharge: 2022-02-16 | Disposition: A | Discharge status: Home / Self Care | Payer: BC Managed Care – PPO | Attending: Emergency Medicine | Admitting: Emergency Medicine

## 2022-02-16 ENCOUNTER — Encounter (HOSPITAL_COMMUNITY): Payer: Self-pay | Admitting: Emergency Medicine

## 2022-02-16 DIAGNOSIS — S199XXA Unspecified injury of neck, initial encounter: Secondary | ICD-10-CM | POA: Diagnosis not present

## 2022-02-16 DIAGNOSIS — S0990XA Unspecified injury of head, initial encounter: Secondary | ICD-10-CM

## 2022-02-16 DIAGNOSIS — I10 Essential (primary) hypertension: Secondary | ICD-10-CM | POA: Diagnosis not present

## 2022-02-16 DIAGNOSIS — C719 Malignant neoplasm of brain, unspecified: Secondary | ICD-10-CM | POA: Diagnosis not present

## 2022-02-16 DIAGNOSIS — D61818 Other pancytopenia: Secondary | ICD-10-CM

## 2022-02-16 DIAGNOSIS — S3991XA Unspecified injury of abdomen, initial encounter: Secondary | ICD-10-CM | POA: Diagnosis not present

## 2022-02-16 DIAGNOSIS — R569 Unspecified convulsions: Secondary | ICD-10-CM | POA: Insufficient documentation

## 2022-02-16 DIAGNOSIS — T1490XA Injury, unspecified, initial encounter: Secondary | ICD-10-CM | POA: Diagnosis not present

## 2022-02-16 DIAGNOSIS — Y92410 Unspecified street and highway as the place of occurrence of the external cause: Secondary | ICD-10-CM | POA: Diagnosis not present

## 2022-02-16 DIAGNOSIS — S161XXA Strain of muscle, fascia and tendon at neck level, initial encounter: Secondary | ICD-10-CM

## 2022-02-16 DIAGNOSIS — Z041 Encounter for examination and observation following transport accident: Secondary | ICD-10-CM | POA: Diagnosis not present

## 2022-02-16 LAB — CBC WITH DIFFERENTIAL/PLATELET
Abs Immature Granulocytes: 0 10*3/uL (ref 0.00–0.07)
Basophils Absolute: 0 10*3/uL (ref 0.0–0.1)
Basophils Relative: 1 %
Eosinophils Absolute: 0 10*3/uL (ref 0.0–0.5)
Eosinophils Relative: 1 %
HCT: 29.3 % — ABNORMAL LOW (ref 36.0–46.0)
Hemoglobin: 9.7 g/dL — ABNORMAL LOW (ref 12.0–15.0)
Lymphocytes Relative: 45 %
Lymphs Abs: 0.7 10*3/uL (ref 0.7–4.0)
MCH: 33.2 pg (ref 26.0–34.0)
MCHC: 33.1 g/dL (ref 30.0–36.0)
MCV: 100.3 fL — ABNORMAL HIGH (ref 80.0–100.0)
Monocytes Absolute: 0.1 10*3/uL (ref 0.1–1.0)
Monocytes Relative: 8 %
Neutro Abs: 0.7 10*3/uL — ABNORMAL LOW (ref 1.7–7.7)
Neutrophils Relative %: 45 %
Platelets: 107 10*3/uL — ABNORMAL LOW (ref 150–400)
RBC: 2.92 MIL/uL — ABNORMAL LOW (ref 3.87–5.11)
RDW: 14.6 % (ref 11.5–15.5)
WBC: 1.5 10*3/uL — ABNORMAL LOW (ref 4.0–10.5)
nRBC: 3.4 % — ABNORMAL HIGH (ref 0.0–0.2)

## 2022-02-16 LAB — COMPREHENSIVE METABOLIC PANEL
ALT: 15 U/L (ref 0–44)
AST: 14 U/L — ABNORMAL LOW (ref 15–41)
Albumin: 4 g/dL (ref 3.5–5.0)
Alkaline Phosphatase: 87 U/L (ref 38–126)
Anion gap: 7 (ref 5–15)
BUN: 15 mg/dL (ref 6–20)
CO2: 21 mmol/L — ABNORMAL LOW (ref 22–32)
Calcium: 9.2 mg/dL (ref 8.9–10.3)
Chloride: 109 mmol/L (ref 98–111)
Creatinine, Ser: 0.9 mg/dL (ref 0.44–1.00)
GFR, Estimated: >60 mL/min (ref >60)
Glucose, Bld: 135 mg/dL — ABNORMAL HIGH (ref 70–99)
Potassium: 3.9 mmol/L (ref 3.5–5.1)
Sodium: 137 mmol/L (ref 135–145)
Total Bilirubin: 0.9 mg/dL (ref 0.3–1.2)
Total Protein: 6.9 g/dL (ref 6.5–8.1)

## 2022-02-16 MED ORDER — IOHEXOL 300 MG/ML  SOLN
100.0000 mL | Freq: Once | INTRAMUSCULAR | Status: AC | PRN
  Administered 2022-02-16: 100 mL via INTRAVENOUS
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
## 2022-02-16 NOTE — ED Notes (Signed)
RN attempted to ambulate Pt in hallway. Pt required stand-by assist X 2. Pt made it apprx 9f, before needing to sit down. A wheel chair was provided for Pt. Pt reports dizziness, and unsteady gait. EDP Notified.
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
## 2022-02-16 NOTE — ED Notes (Signed)
Patient transported to CT 
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
## 2022-02-18 ENCOUNTER — Encounter: Payer: Self-pay | Admitting: Internal Medicine

## 2022-02-21 ENCOUNTER — Other Ambulatory Visit: Payer: Self-pay | Admitting: Internal Medicine

## 2022-02-21 ENCOUNTER — Inpatient Hospital Stay (HOSPITAL_BASED_OUTPATIENT_CLINIC_OR_DEPARTMENT_OTHER): Payer: BC Managed Care – PPO | Admitting: Internal Medicine

## 2022-02-21 ENCOUNTER — Inpatient Hospital Stay: Payer: BC Managed Care – PPO

## 2022-02-21 VITALS — BP 129/81 | HR 65 | Resp 16

## 2022-02-21 VITALS — BP 147/95 | HR 77 | Temp 98.1°F | Resp 16 | Wt 155.4 lb

## 2022-02-21 DIAGNOSIS — C719 Malignant neoplasm of brain, unspecified: Secondary | ICD-10-CM

## 2022-02-21 DIAGNOSIS — R569 Unspecified convulsions: Secondary | ICD-10-CM | POA: Diagnosis not present

## 2022-02-21 DIAGNOSIS — D701 Agranulocytosis secondary to cancer chemotherapy: Secondary | ICD-10-CM | POA: Diagnosis not present

## 2022-02-21 DIAGNOSIS — C712 Malignant neoplasm of temporal lobe: Secondary | ICD-10-CM | POA: Diagnosis not present

## 2022-02-21 DIAGNOSIS — Z5189 Encounter for other specified aftercare: Secondary | ICD-10-CM | POA: Diagnosis not present

## 2022-02-21 DIAGNOSIS — Z5111 Encounter for antineoplastic chemotherapy: Secondary | ICD-10-CM | POA: Diagnosis not present

## 2022-02-21 LAB — CBC WITH DIFFERENTIAL (CANCER CENTER ONLY)
Abs Immature Granulocytes: 0.05 10*3/uL (ref 0.00–0.07)
Basophils Absolute: 0 10*3/uL (ref 0.0–0.1)
Basophils Relative: 0 %
Eosinophils Absolute: 0.1 10*3/uL (ref 0.0–0.5)
Eosinophils Relative: 3 %
HCT: 33.9 % — ABNORMAL LOW (ref 36.0–46.0)
Hemoglobin: 11.6 g/dL — ABNORMAL LOW (ref 12.0–15.0)
Immature Granulocytes: 3 %
Lymphocytes Relative: 64 %
Lymphs Abs: 1.2 10*3/uL (ref 0.7–4.0)
MCH: 34.3 pg — ABNORMAL HIGH (ref 26.0–34.0)
MCHC: 34.2 g/dL (ref 30.0–36.0)
MCV: 100.3 fL — ABNORMAL HIGH (ref 80.0–100.0)
Monocytes Absolute: 0.3 10*3/uL (ref 0.1–1.0)
Monocytes Relative: 15 %
Neutro Abs: 0.3 10*3/uL — CL (ref 1.7–7.7)
Neutrophils Relative %: 15 %
Platelet Count: 126 10*3/uL — ABNORMAL LOW (ref 150–400)
RBC: 3.38 MIL/uL — ABNORMAL LOW (ref 3.87–5.11)
RDW: 15.2 % (ref 11.5–15.5)
Smear Review: NORMAL
WBC Count: 1.9 10*3/uL — ABNORMAL LOW (ref 4.0–10.5)
nRBC: 2.6 % — ABNORMAL HIGH (ref 0.0–0.2)

## 2022-02-21 LAB — COMPREHENSIVE METABOLIC PANEL
ALT: 11 U/L (ref 0–44)
AST: 11 U/L — ABNORMAL LOW (ref 15–41)
Albumin: 4.3 g/dL (ref 3.5–5.0)
Alkaline Phosphatase: 112 U/L (ref 38–126)
Anion gap: 8 (ref 5–15)
BUN: 17 mg/dL (ref 6–20)
CO2: 27 mmol/L (ref 22–32)
Calcium: 9.5 mg/dL (ref 8.9–10.3)
Chloride: 103 mmol/L (ref 98–111)
Creatinine, Ser: 0.87 mg/dL (ref 0.44–1.00)
GFR, Estimated: >60 mL/min (ref >60)
Glucose, Bld: 77 mg/dL (ref 70–99)
Potassium: 3.4 mmol/L — ABNORMAL LOW (ref 3.5–5.1)
Sodium: 138 mmol/L (ref 135–145)
Total Bilirubin: 0.8 mg/dL (ref 0.3–1.2)
Total Protein: 6.9 g/dL (ref 6.5–8.1)

## 2022-02-21 LAB — TOTAL PROTEIN, URINE DIPSTICK: Protein, ur: <30 mg/dL

## 2022-02-21 MED ORDER — SODIUM CHLORIDE 0.9 % IV SOLN: Freq: Once | INTRAVENOUS | Status: AC

## 2022-02-21 MED ORDER — SODIUM CHLORIDE 0.9 % IV SOLN
10.0000 mg/kg | Freq: Once | INTRAVENOUS | Status: AC
  Administered 2022-02-21: 700 mg via INTRAVENOUS
  Filled 2022-02-21: qty 16

## 2022-02-21 NOTE — Patient Instructions (Signed)
[name]  Discharge Instructions: Thank you for choosing [name] to provide your oncology and hematology care.   If you have a lab appointment with the [name], please go directly to the [name] and check in at the registration area.   Wear comfortable clothing and clothing appropriate for easy access to any Portacath or PICC line.   We strive to give you quality time with your provider. You may need to reschedule your appointment if you arrive late (15 or more minutes).  Arriving late affects you and other patients whose appointments are after yours.  Also, if you miss three or more appointments without notifying the office, you may be dismissed from the clinic at the provider's discretion.      For prescription refill requests, have your pharmacy contact our office and allow 72 hours for refills to be completed.    Today you received the following chemotherapy and/or immunotherapy agents: Bevacizumab, Irinotecan.       To help prevent nausea and vomiting after your treatment, we encourage you to take your nausea medication as directed.  BELOW ARE SYMPTOMS THAT SHOULD BE REPORTED IMMEDIATELY: *FEVER GREATER THAN 100.4 F (38 C) OR HIGHER *CHILLS OR SWEATING *NAUSEA AND VOMITING THAT IS NOT CONTROLLED WITH YOUR NAUSEA MEDICATION *UNUSUAL SHORTNESS OF BREATH *UNUSUAL BRUISING OR BLEEDING *URINARY PROBLEMS (pain or burning when urinating, or frequent urination) *BOWEL PROBLEMS (unusual diarrhea, constipation, pain near the anus) TENDERNESS IN MOUTH AND THROAT WITH OR WITHOUT PRESENCE OF ULCERS (sore throat, sores in mouth, or a toothache) UNUSUAL RASH, SWELLING OR PAIN  UNUSUAL VAGINAL DISCHARGE OR ITCHING   Items with * indicate a potential emergency and should be followed up as soon as possible or go to the Emergency Department if any problems should occur.  Please show the CHEMOTHERAPY ALERT CARD or IMMUNOTHERAPY ALERT CARD  at check-in to the Emergency Department and triage nurse.  Should you have questions after your visit or need to cancel or reschedule your appointment, please contact [name]  Dept: [phone]  and follow the prompts.  Office hours are 8:00 a.m. to 4:30 p.m. Monday - Friday. Please note that voicemails left after 4:00 p.m. may not be returned until the following business day.  We are closed weekends and major holidays. You have access to a nurse at all times for urgent questions. Please call the main number to the clinic Dept: [phone] and follow the prompts.   For any non-urgent questions, you may also contact your provider using MyChart. We now offer e-Visits for anyone 18 and older to request care online for non-urgent symptoms. For details visit [email or web address].   Also download the MyChart app! Go to the app store, search "MyChart", open the app, select [facility], and log in with your MyChart username and password.  Masks are optional in the cancer centers. If you would like for your care team to wear a mask while they are taking care of you, please let them know. You may have one support person who is at least 54 years old accompany you for your appointments. Irinotecan Injection What is this medication? IRINOTECAN (ir in oh TEE kan) treats some types of cancer. It works by slowing down the growth of cancer cells. This medicine may be used for other purposes; ask your health care provider or pharmacist if you have questions. COMMON BRAND NAME(S): Camptosar What should I tell my care team before I take   this medication? They need to know if you have any of these conditions: Dehydration Diarrhea Infection, especially a viral infection, such as chickenpox, cold sores, herpes Liver disease Low blood cell levels (white cells, red cells, and platelets) Low levels of electrolytes, such as calcium, magnesium, or potassium in your blood Recent or  ongoing radiation An unusual or allergic reaction to irinotecan, other medications, foods, dyes, or preservatives If you or your partner are pregnant or trying to get pregnant Breast-feeding How should I use this medication? This medication is injected into a vein. It is given by your care team in a hospital or clinic setting. Talk to your care team about the use of this medication in children. Special care may be needed. Overdosage: If you think you have taken too much of this medicine contact a poison control [name] or emergency room at once. NOTE: This medicine is only for you. Do not share this medicine with others. What if I miss a dose? Keep appointments for follow-up doses. It is important not to miss your dose. Call your care team if you are unable to keep an appointment. What may interact with this medication? Do not take this medication with any of the following: Cobicistat Itraconazole This medication may also interact with the following: Certain antibiotics, such as clarithromycin, rifampin, rifabutin Certain antivirals for HIV or AIDS Certain medications for fungal infections, such as ketoconazole, posaconazole, voriconazole Certain medications for seizures, such as carbamazepine, phenobarbital, phenytoin Gemfibrozil Nefazodone St. John's wort This list may not describe all possible interactions. Give your health care provider a list of all the medicines, herbs, non-prescription drugs, or dietary supplements you use. Also tell them if you smoke, drink alcohol, or use illegal drugs. Some items may interact with your medicine. What should I watch for while using this medication? Your condition will be monitored carefully while you are receiving this medication. You may need blood work while taking this medication. This medication may make you feel generally unwell. This is not uncommon as chemotherapy can affect healthy cells as well as cancer cells. Report any side effects.  Continue your course of treatment even though you feel ill unless your care team tells you to stop. This medication can cause serious side effects. To reduce the risk, your care team may give you other medications to take before receiving this one. Be sure to follow the directions from your care team. This medication may affect your coordination, reaction time, or judgement. Do not drive or operate machinery until you know how this medication affects you. Sit up or stand slowly to reduce the risk of dizzy or fainting spells. Drinking alcohol with this medication can increase the risk of these side effects. This medication may increase your risk of getting an infection. Call your care team for advice if you get a fever, chills, sore throat, or other symptoms of a cold or flu. Do not treat yourself. Try to avoid being around people who are sick. Avoid taking medications that contain aspirin, acetaminophen, ibuprofen, naproxen, or ketoprofen unless instructed by your care team. These medications may hide a fever. This medication may increase your risk to bruise or bleed. Call your care team if you notice any unusual bleeding. Be careful brushing or flossing your teeth or using a toothpick because you may get an infection or bleed more easily. If you have any dental work done, tell your dentist you are receiving this medication. Talk to your care team if you or your partner are pregnant   or think either of you might be pregnant. This medication can cause serious birth defects if taken during pregnancy and for 6 months after the last dose. You will need a negative pregnancy test before starting this medication. Contraception is recommended while taking this medication and for 6 months after the last dose. Your care team can help you find the option that works for you. Do not father a child while taking this medication and for 3 months after the last dose. Use a condom for contraception during this time period. Do  not breastfeed while taking this medication and for 7 days after the last dose. This medication may cause infertility. Talk to your care team if you are concerned about your fertility. What side effects may I notice from receiving this medication? Side effects that you should report to your care team as soon as possible: Allergic reactions--skin rash, itching, hives, swelling of the face, lips, tongue, or throat Dry cough, shortness of breath or trouble breathing Increased saliva or tears, increased sweating, stomach cramping, diarrhea, small pupils, unusual weakness or fatigue, slow heartbeat Infection--fever, chills, cough, sore throat, wounds that don't heal, pain or trouble when passing urine, general feeling of discomfort or being unwell Kidney injury--decrease in the amount of urine, swelling of the ankles, hands, or feet Low red blood cell level--unusual weakness or fatigue, dizziness, headache, trouble breathing Severe or prolonged diarrhea Unusual bruising or bleeding Side effects that usually do not require medical attention (report to your care team if they continue or are bothersome): Constipation Diarrhea Hair loss Loss of appetite Nausea Stomach pain This list may not describe all possible side effects. Call your doctor for medical advice about side effects. You may report side effects to FDA at 1-800-FDA-1088. Where should I keep my medication? This medication is given in a hospital or clinic. It will not be stored at home. NOTE: This sheet is a summary. It may not cover all possible information. If you have questions about this medicine, talk to your doctor, pharmacist, or health care provider.  2023 [employer]vier/Gold Standard (2021-08-30 00:00:00) 

## 2022-02-21 NOTE — Progress Notes (Signed)
Per Dr Mickeal Skinner patient will receive from [facility] ONLY today 02/21/2022 with labs and vitals.  She will need to come back tomorrow for Zarxio injection.  HOLD Irinotecan.  Will have patient back in 2 weeks.
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
## 2022-02-22 ENCOUNTER — Telehealth: Payer: Self-pay | Admitting: Licensed Clinical Social Worker

## 2022-02-22 ENCOUNTER — Inpatient Hospital Stay: Payer: BC Managed Care – PPO

## 2022-02-22 VITALS — BP 129/93 | HR 85 | Temp 98.8°F | Resp 16

## 2022-02-22 DIAGNOSIS — T451X5A Adverse effect of antineoplastic and immunosuppressive drugs, initial encounter: Secondary | ICD-10-CM

## 2022-02-22 DIAGNOSIS — Z5189 Encounter for other specified aftercare: Secondary | ICD-10-CM | POA: Diagnosis not present

## 2022-02-22 DIAGNOSIS — D701 Agranulocytosis secondary to cancer chemotherapy: Secondary | ICD-10-CM | POA: Diagnosis not present

## 2022-02-22 DIAGNOSIS — C712 Malignant neoplasm of temporal lobe: Secondary | ICD-10-CM | POA: Diagnosis not present

## 2022-02-22 DIAGNOSIS — Z5111 Encounter for antineoplastic chemotherapy: Secondary | ICD-10-CM | POA: Diagnosis not present

## 2022-02-22 MED ORDER — FILGRASTIM-SNDZ 300 MCG/0.5ML IJ SOSY
300.0000 ug | PREFILLED_SYRINGE | Freq: Once | INTRAMUSCULAR | Status: AC
  Administered 2022-02-22: 300 ug via SUBCUTANEOUS
  Filled 2022-02-22: qty 0.5

## 2022-02-26 NOTE — Patient Instructions (Signed)
Visit Information  Thank you for taking time to visit with me today. Please don't hesitate to contact me if I can be of assistance to you.   Following are the goals we discussed today:   Goals Addressed             This Visit's Progress    COMPLETED: Care Coordination Activities-No Follow Up Required       Care Coordination Interventions: Active listening / Reflection utilized  Patient reports she is "feeling well" Pt is prioritizing rest. LCSW commended pt for practicing self-care LCSW informed patient of care coordination services. Pt is not interested at this time and agreed to contact PCP, should needs arise          If you are experiencing a Mental Health or [place] or need someone to talk to, please call the Suicide and Crisis Lifeline: 988 call 911   Patient verbalizes understanding of instructions and care plan provided today and agrees to view in [place]. Active MyChart status and patient understanding of how to access instructions and care plan via MyChart confirmed with patient.     No further follow up required:    [name], MSW, [name] Phone [phone] 9:51 AM

## 2022-02-26 NOTE — Patient Outreach (Signed)
  Care Coordination   Initial Visit Note   02/26/2022 Name: [name] MRN: [id number] DOB: 06-10-1965  [name] is a 56 y.o. year old female who sees Nafziger, [name], NP for primary care. I spoke with  Ramond Dial by phone today.  What matters to the patients health and wellness today?  Care Coordination    Goals Addressed             This Visit's Progress    COMPLETED: Care Coordination Activities-No Follow Up Required       Care Coordination Interventions: Active listening / Reflection utilized  Patient reports she is "feeling well" Pt is prioritizing rest. LCSW commended pt for practicing self-care LCSW informed patient of care coordination services. Pt is not interested at this time and agreed to contact PCP, should needs arise          SDOH assessments and interventions completed:  No     Care Coordination Interventions Activated:  Yes  Care Coordination Interventions:  Yes, provided   Follow up plan: No further intervention required.   Encounter Outcome:  Pt. Refused   [name], MSW, [email or web address] Phone [phone] 9:51 AM

## 2022-02-28 ENCOUNTER — Other Ambulatory Visit: Payer: Self-pay | Admitting: *Deleted

## 2022-02-28 DIAGNOSIS — T451X5A Adverse effect of antineoplastic and immunosuppressive drugs, initial encounter: Secondary | ICD-10-CM

## 2022-02-28 DIAGNOSIS — C719 Malignant neoplasm of brain, unspecified: Secondary | ICD-10-CM

## 2022-03-04 ENCOUNTER — Telehealth: Payer: BC Managed Care – PPO | Admitting: Family Medicine

## 2022-03-04 ENCOUNTER — Inpatient Hospital Stay: Payer: BC Managed Care – PPO

## 2022-03-04 NOTE — Progress Notes (Signed)
ATC pt, no answer. Left VM that I was calling her for her visit. (9:36 am)  Sent link for visit 9:36 am.

## 2022-03-06 ENCOUNTER — Ambulatory Visit (HOSPITAL_COMMUNITY)
Admission: RE | Admit: 2022-03-06 | Discharge: 2022-03-06 | Disposition: A | Discharge status: Home / Self Care | Payer: BC Managed Care – PPO | Source: Ambulatory Visit | Attending: Hematology | Admitting: Hematology

## 2022-03-06 ENCOUNTER — Other Ambulatory Visit: Payer: Self-pay | Admitting: *Deleted

## 2022-03-06 DIAGNOSIS — C719 Malignant neoplasm of brain, unspecified: Secondary | ICD-10-CM

## 2022-03-06 DIAGNOSIS — K8689 Other specified diseases of pancreas: Secondary | ICD-10-CM | POA: Insufficient documentation

## 2022-03-06 DIAGNOSIS — C259 Malignant neoplasm of pancreas, unspecified: Secondary | ICD-10-CM | POA: Diagnosis not present

## 2022-03-06 MED ORDER — SODIUM CHLORIDE (PF) 0.9 % IJ SOLN
INTRAMUSCULAR | Status: AC
  Filled 2022-03-06: qty 50

## 2022-03-06 MED ORDER — IOHEXOL 300 MG/ML  SOLN
100.0000 mL | Freq: Once | INTRAMUSCULAR | Status: AC | PRN
  Administered 2022-03-06: 100 mL via INTRAVENOUS

## 2022-03-07 ENCOUNTER — Other Ambulatory Visit: Payer: Self-pay | Admitting: Internal Medicine

## 2022-03-07 ENCOUNTER — Inpatient Hospital Stay (HOSPITAL_BASED_OUTPATIENT_CLINIC_OR_DEPARTMENT_OTHER): Payer: BC Managed Care – PPO | Admitting: Internal Medicine

## 2022-03-07 ENCOUNTER — Other Ambulatory Visit: Payer: Self-pay

## 2022-03-07 ENCOUNTER — Inpatient Hospital Stay: Payer: BC Managed Care – PPO

## 2022-03-07 ENCOUNTER — Inpatient Hospital Stay: Payer: BC Managed Care – PPO | Attending: Internal Medicine | Admitting: Hematology

## 2022-03-07 ENCOUNTER — Other Ambulatory Visit: Payer: Self-pay | Admitting: Radiation Therapy

## 2022-03-07 VITALS — BP 138/95 | HR 61 | Temp 98.0°F | Resp 18

## 2022-03-07 VITALS — BP 142/93 | HR 75 | Temp 97.8°F | Resp 18 | Ht 68.0 in | Wt 156.5 lb

## 2022-03-07 DIAGNOSIS — K8689 Other specified diseases of pancreas: Secondary | ICD-10-CM | POA: Diagnosis not present

## 2022-03-07 DIAGNOSIS — C719 Malignant neoplasm of brain, unspecified: Secondary | ICD-10-CM

## 2022-03-07 DIAGNOSIS — Z800 Family history of malignant neoplasm of digestive organs: Secondary | ICD-10-CM | POA: Insufficient documentation

## 2022-03-07 DIAGNOSIS — R569 Unspecified convulsions: Secondary | ICD-10-CM | POA: Insufficient documentation

## 2022-03-07 DIAGNOSIS — Z90710 Acquired absence of both cervix and uterus: Secondary | ICD-10-CM | POA: Insufficient documentation

## 2022-03-07 DIAGNOSIS — Z5189 Encounter for other specified aftercare: Secondary | ICD-10-CM | POA: Insufficient documentation

## 2022-03-07 DIAGNOSIS — C712 Malignant neoplasm of temporal lobe: Secondary | ICD-10-CM | POA: Diagnosis not present

## 2022-03-07 DIAGNOSIS — Z801 Family history of malignant neoplasm of trachea, bronchus and lung: Secondary | ICD-10-CM | POA: Diagnosis not present

## 2022-03-07 DIAGNOSIS — I10 Essential (primary) hypertension: Secondary | ICD-10-CM | POA: Diagnosis not present

## 2022-03-07 DIAGNOSIS — Z5111 Encounter for antineoplastic chemotherapy: Secondary | ICD-10-CM | POA: Diagnosis not present

## 2022-03-07 LAB — TOTAL PROTEIN, URINE DIPSTICK: Protein, ur: <30 mg/dL — AB

## 2022-03-07 LAB — CBC WITH DIFFERENTIAL (CANCER CENTER ONLY)
Abs Immature Granulocytes: 0.07 10*3/uL (ref 0.00–0.07)
Basophils Absolute: 0 10*3/uL (ref 0.0–0.1)
Basophils Relative: 0 %
Eosinophils Absolute: 0 10*3/uL (ref 0.0–0.5)
Eosinophils Relative: 0 %
HCT: 38.5 % (ref 36.0–46.0)
Hemoglobin: 12.7 g/dL (ref 12.0–15.0)
Immature Granulocytes: 1 %
Lymphocytes Relative: 21 %
Lymphs Abs: 1.4 10*3/uL (ref 0.7–4.0)
MCH: 33 pg (ref 26.0–34.0)
MCHC: 33 g/dL (ref 30.0–36.0)
MCV: 100 fL (ref 80.0–100.0)
Monocytes Absolute: 0.3 10*3/uL (ref 0.1–1.0)
Monocytes Relative: 5 %
Neutro Abs: 5 10*3/uL (ref 1.7–7.7)
Neutrophils Relative %: 73 %
Platelet Count: 184 10*3/uL (ref 150–400)
RBC: 3.85 MIL/uL — ABNORMAL LOW (ref 3.87–5.11)
RDW: 15.3 % (ref 11.5–15.5)
WBC Count: 6.8 10*3/uL (ref 4.0–10.5)
nRBC: 0.6 % — ABNORMAL HIGH (ref 0.0–0.2)

## 2022-03-07 LAB — CMP (CANCER CENTER ONLY)
ALT: 61 U/L — ABNORMAL HIGH (ref 0–44)
AST: 18 U/L (ref 15–41)
Albumin: 4.2 g/dL (ref 3.5–5.0)
Alkaline Phosphatase: 117 U/L (ref 38–126)
Anion gap: 8 (ref 5–15)
BUN: 17 mg/dL (ref 6–20)
CO2: 27 mmol/L (ref 22–32)
Calcium: 10 mg/dL (ref 8.9–10.3)
Chloride: 103 mmol/L (ref 98–111)
Creatinine: 0.89 mg/dL (ref 0.44–1.00)
GFR, Estimated: >60 mL/min (ref >60)
Glucose, Bld: 93 mg/dL (ref 70–99)
Potassium: 4 mmol/L (ref 3.5–5.1)
Sodium: 138 mmol/L (ref 135–145)
Total Bilirubin: 0.8 mg/dL (ref 0.3–1.2)
Total Protein: 7.6 g/dL (ref 6.5–8.1)

## 2022-03-07 MED ORDER — PALONOSETRON HCL INJECTION 0.25 MG/5ML
0.2500 mg | Freq: Once | INTRAVENOUS | Status: AC
  Administered 2022-03-07: 0.25 mg via INTRAVENOUS
  Filled 2022-03-07: qty 5

## 2022-03-07 MED ORDER — SODIUM CHLORIDE 0.9 % IV SOLN: Freq: Once | INTRAVENOUS | Status: AC

## 2022-03-07 MED ORDER — SODIUM CHLORIDE 0.9 % IV SOLN
10.0000 mg/kg | Freq: Once | INTRAVENOUS | Status: AC
  Administered 2022-03-07: 700 mg via INTRAVENOUS
  Filled 2022-03-07: qty 16

## 2022-03-07 MED ORDER — IRINOTECAN HCL CHEMO INJECTION 100 MG/5ML
125.0000 mg/m2 | Freq: Once | INTRAVENOUS | Status: AC
  Administered 2022-03-07: 240 mg via INTRAVENOUS
  Filled 2022-03-07: qty 12

## 2022-03-07 MED ORDER — SODIUM CHLORIDE 0.9 % IV SOLN
10.0000 mg | Freq: Once | INTRAVENOUS | Status: AC
  Administered 2022-03-07: 10 mg via INTRAVENOUS
  Filled 2022-03-07: qty 10

## 2022-03-07 NOTE — Progress Notes (Signed)
[name]   [phone] [phone]   Clinic Follow up Note   Patient Care Team: [name], NP as PCP - General (Family Medicine) Jacki Cones, MD as Referring Physician (Gastroenterology) Truitt Merle, MD as Consulting Physician (Oncology) Ventura Sellers, MD as Consulting Physician (Oncology)  Date of Service:  03/07/2022  CHIEF COMPLAINT: f/u of pancreatic mass  CURRENT THERAPY:  Observation  ASSESSMENT & PLAN:  [name] is a 56 y.o. female with   1. Mass in head of pancreas -incidental finding during work up for new onset seizure. CT AP on 11/15/20 showed a 1.2 cm mass in the pancreatic head/uncinate process. Further work up was held due to during treatment of her glioblastoma.  Retrospectively, the mass previously presented in her CT scan in 2014. -Due to abdominal cramping and patient request, she underwent repeat CT CAP 05/25/21 which showed mild enlargement of the lesion now measuring 1.6  cm, with less than 180 degree abutment of the SMV and no involvement of the celiac or SMA.  No evidence of metastatic disease in the abdomen or pelvis. -baseline CA 19.9 and chromogranin A on 06/11/21 were WNL -most recent CT AP on 03/06/22 showed: stable 1.7 cm lobular lesion of lower pancreatic head; overall stable, no lymphadenopathy.  -I compared her CT scan images with her previous CTs, including the one from 2014.  Reviewed the results with them today.  Given the overall stability of the mass over 9 years, I reassured them that this is very likely benign. No further follow-up scan is needed.   2. Glioblastoma, WHO grade IV, IDH 1/2 wild-type -presented to the ED 11/15/20 with new onset seizures, work-up showed large enhancing left temporal mass. -S/p left craniotomy 11/21/20 by Dr. Emelda Brothers who achieved near-total resection -S/p adjuvant IMRT (Dr. Isidore Moos) and concurrent Temodar 12/27/20 - 02/07/21, 5 cycles Temodar 03/12/21 - 08/26/21, and  CCNU with Avastin 09/06/21 - 01/01/22. -most recent brain MRI on 12/29/21 showed small area of progression in left periatrial and parietal white matter. -she was switched to CPT-11 with continued Avastin on 01/17/22. Today is C2D15. Will continue per Dr. Mickeal Skinner. -next brain MRI scheduled for 03/14/22.     PLAN: -continue f/u and treatment per Dr. Mickeal Skinner for glioblastoma -No further surveillance image or biopsy needed for her pancreatic mass, I will see her as needed.   No problem-specific Assessment & Plan notes found for this encounter.   SUMMARY OF ONCOLOGIC HISTORY: Oncology History  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[name])  11/21/2020 Initial Diagnosis   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[name])   11/21/2020 Cancer Staging   Staging form: Brain and Spinal Cord, AJCC 8th Edition - Pathologic stage from 11/21/2020: WHO Grade IV - Signed by Ventura Sellers, MD on 11/30/2020 Histopathologic type: Glioblastoma Stage prefix: Initial diagnosis Histologic grading system: 4 grade system Extent of surgical resection: Subtotal resection Karnofsky performance status: Score 90 Seizures at presentation: Present Duration of symptoms before diagnosis: Short IDH1 mutation: Negative   12/27/2020 - 02/07/2021 Radiation Therapy   IMRT and concurrent Temodar Isidore Moos)   03/12/2021 - 08/26/2021 Chemotherapy   Completes 5 cycles of adjuvant 5-day Temodar   08/27/2021 Progression   POD #1   09/06/2021 -  Chemotherapy   Initiates second line therapy; CCNU 76m/m2 PO q6 weeks and avastin 132mkg IV q2 weeks   12/28/2021 Progression   POD #2   01/17/2022 -  Chemotherapy   Third line therapy with Irinotecan  and Avastin q2 weeks      INTERVAL HISTORY:  [name] is here for a follow up of pancreatic mass. She was last seen by me on 08/01/21. She presents to the clinic accompanied by family. She denies any GI/abdominal concerns. She continues treatment for  glioblastoma per Dr. Mickeal Skinner.   All other systems were reviewed with the patient and are negative.  MEDICAL HISTORY:  Past Medical History:  Diagnosis Date   Bell's palsy    left side of face- notied in smile and eyes, if very tired   Brain cancer ([name])    Complication of anesthesia    N/V   Essential hypertension    External hemorrhoids    History of blood transfusion    post op Hemorroids   History of pneumonia    Hyperlipidemia    Hyperthyroidism    hx 56 years old- oral meds   Iron deficiency anemia    Migraine headache    Pneumonia    x 2 - years ago   PONV (postoperative nausea and vomiting)    PPD positive    Pulmonary nodules    not seen on plain cxr, first detected 1/07 re ct 10/08 pos IPPD > 15 mm 12/09 FOB/lavage 04/20/08 neg afb smear   PVC's (premature ventricular contractions)    Seizure ([place]) 11/15/2020   Sleep apnea    refused CPAP- mouth piece recommended   Tuberculosis    in her late 30's    SURGICAL HISTORY: Past Surgical History:  Procedure Laterality Date   ABDOMINAL HYSTERECTOMY     APPLICATION OF CRANIAL NAVIGATION N/A 11/21/2020   Procedure: APPLICATION OF CRANIAL NAVIGATION;  Surgeon: Judith Part, MD;  Location: [facility];  Service: Neurosurgery;  Laterality: N/A;   BREAST BIOPSY Right 2014   BREAST BIOPSY Left 10/29/2019   CHOLECYSTECTOMY  2014   COLONOSCOPY  05/04/2009   prolapsing external hemorrhoids   CRANIOTOMY Left 11/21/2020   Procedure: Left craniotomy for tumor resection;  Surgeon: Judith Part, MD;  Location: [facility];  Service: Neurosurgery;  Laterality: Left;   ENDOMETRIAL ABLATION     HEMORRHOID SURGERY     x 3   TUBAL LIGATION      I have reviewed the social history and family history with the patient and they are unchanged from previous note.  ALLERGIES:  has No Known Allergies.  MEDICATIONS:  Current Outpatient Medications  Medication Sig Dispense Refill   amLODipine (NORVASC) 10 MG tablet TAKE 1 TABLET BY  MOUTH EVERY DAY 90 tablet 1   dexamethasone (DECADRON) 2 MG tablet Take 1 tablet (2 mg total) by mouth daily. 30 tablet 1   lacosamide (VIMPAT) 50 MG TABS tablet Take 2 tablets (100 mg total) by mouth every morning AND 1 tablet (50 mg total) at bedtime. 90 tablet 3   levETIRAcetam (KEPPRA) 750 MG tablet Take 2 tablets (1,500 mg total) by mouth 2 (two) times daily. 360 tablet 1   levothyroxine (SYNTHROID) 100 MCG tablet Take 1 tablet (100 mcg total) by mouth daily. 90 tablet 3   LORazepam (ATIVAN) 2 MG tablet Take 1 tablet (2 mg total) by mouth every 8 (eight) hours as needed for seizure. (Patient not taking: Reported on 01/31/2022) 12 tablet 0   ondansetron (ZOFRAN-ODT) 4 MG disintegrating tablet Take 1 tablet (4 mg total) by mouth every 8 (eight) hours as needed for nausea or vomiting. (Patient not taking: Reported on 01/31/2022) 10 tablet 0   Potassium Chloride ER 20  MEQ TBCR TAKE 1 TABLET BY MOUTH EVERY DAY 90 tablet 3   prochlorperazine (COMPAZINE) 10 MG tablet Take 10 mg by mouth every 6 (six) hours as needed for nausea or vomiting.     No current facility-administered medications for this visit.    PHYSICAL EXAMINATION: ECOG PERFORMANCE STATUS: 1 - Symptomatic but completely ambulatory  There were no vitals filed for this visit. Wt Readings from Last 3 Encounters:  03/07/22 156 lb 8 oz (71 kg)  02/21/22 155 lb 6.4 oz (70.5 kg)  02/16/22 156 lb 8.4 oz (71 kg)     GENERAL:alert, no distress and comfortable SKIN: skin color normal, no rashes or significant lesions EYES: normal, Conjunctiva are pink and non-injected, sclera clear  NEURO: alert & oriented x 3 with fluent speech  LABORATORY DATA:  I have reviewed the data as listed    Latest Ref Rng & Units 03/07/2022   11:11 AM 02/21/2022   10:02 AM 02/16/2022    8:57 PM  CBC  WBC 4.0 - 10.5 K/uL 6.8  1.9  1.5   Hemoglobin 12.0 - 15.0 g/dL 12.7  11.6  9.7   Hematocrit 36.0 - 46.0 % 38.5  33.9  29.3   Platelets 150 - 400 K/uL 184   126  107         Latest Ref Rng & Units 03/07/2022   11:11 AM 02/21/2022   10:08 AM 02/16/2022    8:57 PM  CMP  Glucose 70 - 99 mg/dL 93  77  135   BUN 6 - 20 mg/dL _0 Creatinine 0.44 - 1.00 mg/dL 0.89  0.87  0.90   Sodium 135 - 145 mmol/L 138  138  137   Potassium 3.5 - 5.1 mmol/L 4.0  3.4  3.9   Chloride 98 - 111 mmol/L 103  103  109   CO2 22 - 32 mmol/L _1 Calcium 8.9 - 10.3 mg/dL 10.0  9.5  9.2   Total Protein 6.5 - 8.1 g/dL 7.6  6.9  6.9   Total Bilirubin 0.3 - 1.2 mg/dL 0.8  0.8  0.9   Alkaline Phos 38 - 126 U/L 117  112  87   AST 15 - 41 U/L _2 ALT 0 - 44 U/L 61  11  15       RADIOGRAPHIC STUDIES: I have personally reviewed the radiological images as listed and agreed with the findings in the report. CT ABD PELVIS W/WO CM ONCOLOGY PANCREATIC PROTOCOL  Result Date: 03/07/2022 CLINICAL DATA:  Follow-up pancreatic cancer.  * Tracking Code: BO * EXAM: CT ABDOMEN AND PELVIS WITHOUT AND WITH CONTRAST TECHNIQUE: Multidetector CT imaging of the abdomen and pelvis was performed following the standard protocol before and following the bolus administration of intravenous contrast. RADIATION DOSE REDUCTION: This exam was performed according to the departmental dose-optimization program which includes automated exposure control, adjustment of the mA and/or kV according to patient size and/or use of iterative reconstruction technique. CONTRAST:  128m OMNIPAQUE IOHEXOL 300 MG/ML  SOLN COMPARISON:  Multiple prior imaging studies. The most recent is 02/16/2022 02/16/2022 FINDINGS: Lower chest: The lung bases are clear of acute process. No pleural effusion or pulmonary lesions. The heart is normal in size. No pericardial effusion. The distal esophagus and aorta are unremarkable. Hepatobiliary: No hepatic lesions or intrahepatic biliary dilatation. The gallbladder is surgically absent. No common bile duct dilatation. Pancreas: There is  a stable smoothly marginated and  well circumscribed lobular lesion projecting off the anterior aspect of the lower pancreatic head. This measures a maximum a 17 mm. This lesion has been present since 2014 and is considered benign. I believe this is most likely a normal variant of pancreatic anatomy. Spleen: Normal size.  No focal lesions. Adrenals/Urinary Tract: The adrenal glands and kidneys are normal. Stomach/Bowel: The stomach, duodenum, visualized small bowel and visualized colon unremarkable. Vascular/Lymphatic: The aorta and branch vessels are stable. Moderate age advanced atherosclerotic calcifications involving the distal aorta. No mesenteric or retroperitoneal adenopathy. Reproductive: The uterus is surgically absent. I believe the right ovary is still present to is unremarkable. Other: No pelvic mass or adenopathy. No free pelvic fluid collections. No inguinal mass or adenopathy. No abdominal wall hernia or subcutaneous lesions. Musculoskeletal: No significant bony findings. IMPRESSION: 1. Stable 17 mm smoothly marginated and well circumscribed lobular lesion projecting off the anterior aspect of the lower pancreatic head. This has been present since 2014 and is considered benign. 2. No abdominal/pelvic lymphadenopathy. 3. Status post cholecystectomy. No biliary dilatation. 4. Age advanced atherosclerotic calcifications involving the distal aorta. Aortic Atherosclerosis (ICD10-I70.0). Electronically Signed   By: Marijo Sanes M.D.   On: 03/07/2022 09:11      No orders of the defined types were placed in this encounter.  All questions were answered. The patient knows to call the clinic with any problems, questions or concerns. No barriers to learning was detected. The total time spent in the appointment was 30 minutes.     Truitt Merle, MD 03/07/2022   I, Wilburn Mylar, am acting as scribe for Truitt Merle, MD.   I have reviewed the above documentation for accuracy and completeness, and I agree with the above.

## 2022-03-07 NOTE — Patient Instructions (Signed)
[facility]  Discharge Instructions: Thank you for choosing [facility] to provide your oncology and hematology care.   If you have a lab appointment with the [facility], please go directly to the [facility] and check in at the registration area.   Wear comfortable clothing and clothing appropriate for easy access to any Portacath or PICC line.   We strive to give you quality time with your provider. You may need to reschedule your appointment if you arrive late (15 or more minutes).  Arriving late affects you and other patients whose appointments are after yours.  Also, if you miss three or more appointments without notifying the office, you may be dismissed from the clinic at the provider's discretion.      For prescription refill requests, have your pharmacy contact our office and allow 72 hours for refills to be completed.    Today you received the following chemotherapy and/or immunotherapy agents: Bevacizumab, Irinotecan.       To help prevent nausea and vomiting after your treatment, we encourage you to take your nausea medication as directed.  BELOW ARE SYMPTOMS THAT SHOULD BE REPORTED IMMEDIATELY: *FEVER GREATER THAN 100.4 F (38 C) OR HIGHER *CHILLS OR SWEATING *NAUSEA AND VOMITING THAT IS NOT CONTROLLED WITH YOUR NAUSEA MEDICATION *UNUSUAL SHORTNESS OF BREATH *UNUSUAL BRUISING OR BLEEDING *URINARY PROBLEMS (pain or burning when urinating, or frequent urination) *BOWEL PROBLEMS (unusual diarrhea, constipation, pain near the anus) TENDERNESS IN MOUTH AND THROAT WITH OR WITHOUT PRESENCE OF ULCERS (sore throat, sores in mouth, or a toothache) UNUSUAL RASH, SWELLING OR PAIN  UNUSUAL VAGINAL DISCHARGE OR ITCHING   Items with * indicate a potential emergency and should be followed up as soon as possible or go to the Emergency Department if any problems should occur.  Please show the CHEMOTHERAPY ALERT CARD or IMMUNOTHERAPY ALERT CARD  at check-in to the Emergency Department and triage nurse.  Should you have questions after your visit or need to cancel or reschedule your appointment, please contact [facility]  Dept: [phone]  and follow the prompts.  Office hours are 8:00 a.m. to 4:30 p.m. Monday - Friday. Please note that voicemails left after 4:00 p.m. may not be returned until the following business day.  We are closed weekends and major holidays. You have access to a nurse at all times for urgent questions. Please call the main number to the clinic Dept: [phone] and follow the prompts.   For any non-urgent questions, you may also contact your provider using MyChart. We now offer e-Visits for anyone 54 and older to request care online for non-urgent symptoms. For details visit [email or web address].   Also download the MyChart app! Go to the app store, search "MyChart", open the app, select [facility], and log in with your MyChart username and password.  Masks are optional in the cancer centers. If you would like for your care team to wear a mask while they are taking care of you, please let them know. You may have one support person who is at least 54 years old accompany you for your appointments. Irinotecan Injection What is this medication? IRINOTECAN (ir in oh TEE kan) treats some types of cancer. It works by slowing down the growth of cancer cells. This medicine may be used for other purposes; ask your health care provider or pharmacist if you have questions. COMMON BRAND NAME(S): Camptosar What should I tell my care team before I take  this medication? They need to know if you have any of these conditions: Dehydration Diarrhea Infection, especially a viral infection, such as chickenpox, cold sores, herpes Liver disease Low blood cell levels (white cells, red cells, and platelets) Low levels of electrolytes, such as calcium, magnesium, or potassium in your blood Recent or  ongoing radiation An unusual or allergic reaction to irinotecan, other medications, foods, dyes, or preservatives If you or your partner are pregnant or trying to get pregnant Breast-feeding How should I use this medication? This medication is injected into a vein. It is given by your care team in a hospital or clinic setting. Talk to your care team about the use of this medication in children. Special care may be needed. Overdosage: If you think you have taken too much of this medicine contact a poison control center or emergency room at once. NOTE: This medicine is only for you. Do not share this medicine with others. What if I miss a dose? Keep appointments for follow-up doses. It is important not to miss your dose. Call your care team if you are unable to keep an appointment. What may interact with this medication? Do not take this medication with any of the following: Cobicistat Itraconazole This medication may also interact with the following: Certain antibiotics, such as clarithromycin, rifampin, rifabutin Certain antivirals for HIV or AIDS Certain medications for fungal infections, such as ketoconazole, posaconazole, voriconazole Certain medications for seizures, such as carbamazepine, phenobarbital, phenytoin Gemfibrozil Nefazodone St. John's wort This list may not describe all possible interactions. Give your health care provider a list of all the medicines, herbs, non-prescription drugs, or dietary supplements you use. Also tell them if you smoke, drink alcohol, or use illegal drugs. Some items may interact with your medicine. What should I watch for while using this medication? Your condition will be monitored carefully while you are receiving this medication. You may need blood work while taking this medication. This medication may make you feel generally unwell. This is not uncommon as chemotherapy can affect healthy cells as well as cancer cells. Report any side effects.  Continue your course of treatment even though you feel ill unless your care team tells you to stop. This medication can cause serious side effects. To reduce the risk, your care team may give you other medications to take before receiving this one. Be sure to follow the directions from your care team. This medication may affect your coordination, reaction time, or judgement. Do not drive or operate machinery until you know how this medication affects you. Sit up or stand slowly to reduce the risk of dizzy or fainting spells. Drinking alcohol with this medication can increase the risk of these side effects. This medication may increase your risk of getting an infection. Call your care team for advice if you get a fever, chills, sore throat, or other symptoms of a cold or flu. Do not treat yourself. Try to avoid being around people who are sick. Avoid taking medications that contain aspirin, acetaminophen, ibuprofen, naproxen, or ketoprofen unless instructed by your care team. These medications may hide a fever. This medication may increase your risk to bruise or bleed. Call your care team if you notice any unusual bleeding. Be careful brushing or flossing your teeth or using a toothpick because you may get an infection or bleed more easily. If you have any dental work done, tell your dentist you are receiving this medication. Talk to your care team if you or your partner are pregnant  or think either of you might be pregnant. This medication can cause serious birth defects if taken during pregnancy and for 6 months after the last dose. You will need a negative pregnancy test before starting this medication. Contraception is recommended while taking this medication and for 6 months after the last dose. Your care team can help you find the option that works for you. Do not father a child while taking this medication and for 3 months after the last dose. Use a condom for contraception during this time period. Do  not breastfeed while taking this medication and for 7 days after the last dose. This medication may cause infertility. Talk to your care team if you are concerned about your fertility. What side effects may I notice from receiving this medication? Side effects that you should report to your care team as soon as possible: Allergic reactions--skin rash, itching, hives, swelling of the face, lips, tongue, or throat Dry cough, shortness of breath or trouble breathing Increased saliva or tears, increased sweating, stomach cramping, diarrhea, small pupils, unusual weakness or fatigue, slow heartbeat Infection--fever, chills, cough, sore throat, wounds that don't heal, pain or trouble when passing urine, general feeling of discomfort or being unwell Kidney injury--decrease in the amount of urine, swelling of the ankles, hands, or feet Low red blood cell level--unusual weakness or fatigue, dizziness, headache, trouble breathing Severe or prolonged diarrhea Unusual bruising or bleeding Side effects that usually do not require medical attention (report to your care team if they continue or are bothersome): Constipation Diarrhea Hair loss Loss of appetite Nausea Stomach pain This list may not describe all possible side effects. Call your doctor for medical advice about side effects. You may report side effects to FDA at 1-800-FDA-1088. Where should I keep my medication? This medication is given in a hospital or clinic. It will not be stored at home. NOTE: This sheet is a summary. It may not cover all possible information. If you have questions about this medicine, talk to your doctor, pharmacist, or health care provider.  2023 Elsevier/Gold Standard (2021-08-30 00:00:00)

## 2022-03-07 NOTE — Patient Instructions (Signed)
F/u as needed

## 2022-03-07 NOTE — Progress Notes (Signed)
[facility] [place], [place] [address] [phone]   Interval Evaluation  Date of Service: 03/07/22 Patient Name: [name] Patient MRN: [id number] Patient DOB: [birth year] Provider: Ventura Sellers, MD  Identifying Statement:  [name] is a 56 y.o. female with left temporal glioblastoma   Oncologic History: Oncology History  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[place])  11/21/2020 Initial Diagnosis   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[place])   11/21/2020 Cancer Staging   Staging form: Brain and Spinal Cord, AJCC 8th Edition - Pathologic stage from 11/21/2020: WHO Grade IV - Signed by Ventura Sellers, MD on 11/30/2020 Histopathologic type: Glioblastoma Stage prefix: Initial diagnosis Histologic grading system: 4 grade system Extent of surgical resection: Subtotal resection Karnofsky performance status: Score 90 Seizures at presentation: Present Duration of symptoms before diagnosis: Short IDH1 mutation: Negative   12/27/2020 - 02/07/2021 Radiation Therapy   IMRT and concurrent Temodar [name])   03/12/2021 - 08/26/2021 Chemotherapy   Completes 5 cycles of adjuvant 5-day Temodar   08/27/2021 Progression   POD #1   09/06/2021 -  Chemotherapy   Initiates second line therapy; CCNU 61m/m2 PO q6 weeks and avastin 125mkg IV q2 weeks   12/28/2021 Progression   POD #2   01/17/2022 -  Chemotherapy   Third line therapy with Irinotecan and Avastin q2 weeks     Biomarkers:  MGMT Methylated.  IDH 1/2 Wild type.  EGFR Unknown  TERT Unknown   Interval History: [name] PENZAresents today for CPT-11 and avastin infusion.  No clinical changes today, no reported recurrence of diarrhea.  No other new or progressive complaints, no further seizures after event earlier this month.  Short term memory impairment and fatigue are still persistent.       H+P (11/25/20) Patient  presented to medical attention this past month with first ever seizure.  She describes episode of sudden onset "disconnection" with tongue biting and urination, followed by period of confusion.  CNS imaging demonstrated enhancing mass within left anterior temporal lobe, which was mostly resected by Dr. [name] 11/21/20.  Since surgery she had not had recurrence of seizures.  She complains of painful cramping of her right leg, which started ~1 week prior.  At this time the cramping is her main complaint.  Otherwise fully functional, independent, would like to return to work if possible.  Medications: Current Outpatient Medications on File Prior to Visit  Medication Sig Dispense Refill   amLODipine (NORVASC) 10 MG tablet TAKE 1 TABLET BY MOUTH EVERY DAY 90 tablet 1   dexamethasone (DECADRON) 2 MG tablet Take 1 tablet (2 mg total) by mouth daily. (Patient not taking: Reported on 02/21/2022) 30 tablet 1   lacosamide (VIMPAT) 50 MG TABS tablet Take 2 tablets (100 mg total) by mouth every morning AND 1 tablet (50 mg total) at bedtime. 90 tablet 3   levETIRAcetam (KEPPRA) 750 MG tablet Take 2 tablets (1,500 mg total) by mouth 2 (two) times daily. 360 tablet 1   levothyroxine (SYNTHROID) 100 MCG tablet Take 1 tablet (100 mcg total) by mouth daily. 90 tablet 3   LORazepam (ATIVAN) 2 MG tablet Take 1 tablet (2 mg total) by mouth every 8 (eight) hours as needed for seizure. (Patient not taking: Reported on 01/31/2022) 12 tablet 0   ondansetron (ZOFRAN-ODT) 4 MG disintegrating tablet Take 1 tablet (4 mg total) by mouth every 8 (eight) hours as needed for nausea or  vomiting. (Patient not taking: Reported on 01/31/2022) 10 tablet 0   Potassium Chloride ER 20 MEQ TBCR TAKE 1 TABLET BY MOUTH EVERY DAY 90 tablet 3   No current facility-administered medications on file prior to visit.    Allergies: No Known Allergies Past Medical History:  Past Medical History:  Diagnosis Date   Bell's palsy    left side of  face- notied in smile and eyes, if very tired   Brain cancer ([place])    Complication of anesthesia    N/V   Essential hypertension    External hemorrhoids    History of blood transfusion    post op Hemorroids   History of pneumonia    Hyperlipidemia    Hyperthyroidism    hx 56 years old- oral meds   Iron deficiency anemia    Migraine headache    Pneumonia    x 2 - years ago   PONV (postoperative nausea and vomiting)    PPD positive    Pulmonary nodules    not seen on plain cxr, first detected 1/07 re ct 10/08 pos IPPD > 15 mm 12/09 FOB/lavage 04/20/08 neg afb smear   PVC's (premature ventricular contractions)    Seizure ([place]) 11/15/2020   Sleep apnea    refused CPAP- mouth piece recommended   Tuberculosis    in her late 30's   Past Surgical History:  Past Surgical History:  Procedure Laterality Date   ABDOMINAL HYSTERECTOMY     APPLICATION OF CRANIAL NAVIGATION N/A 11/21/2020   Procedure: APPLICATION OF CRANIAL NAVIGATION;  Surgeon: Judith Part, MD;  Location: [facility];  Service: Neurosurgery;  Laterality: N/A;   BREAST BIOPSY Right 2014   BREAST BIOPSY Left 10/29/2019   CHOLECYSTECTOMY  2014   COLONOSCOPY  05/04/2009   prolapsing external hemorrhoids   CRANIOTOMY Left 11/21/2020   Procedure: Left craniotomy for tumor resection;  Surgeon: Judith Part, MD;  Location: [facility];  Service: Neurosurgery;  Laterality: Left;   ENDOMETRIAL ABLATION     HEMORRHOID SURGERY     x 3   TUBAL LIGATION     Social History:  Social History   Socioeconomic History   Marital status: Divorced    Spouse name: Not on file   Number of children: 2   Years of education: Not on file   Highest education level: Associate degree: occupational, Hotel manager, or vocational program  Occupational History   Occupation: accounts Sport and exercise psychologist: [employer]  Tobacco Use   Smoking status: Never   Smokeless tobacco: Never  Vaping Use   Vaping Use: Never used  Substance and Sexual  Activity   Alcohol use: No    Alcohol/week: 0.0 standard drinks of alcohol   Drug use: No   Sexual activity: Not on file  Other Topics Concern   Not on file  Social History Narrative   Divorced with 2 children   She works as a Librarian, 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Press photographer    Possible TB exposure 2008 (after nodules found)   From [place] lived there and [place]   Never owned birds      Social Determinants of [place] Strain: [place] (06/13/2021)   Overall Financial Resource Strain (CARDIA)    Difficulty of Paying Living Expenses: Somewhat hard  Food Insecurity: Food Insecurity Present (04/23/2021)   Hunger Vital Sign    Worried About Running Out of Food in the Last Year: Sometimes true    Ran Out of Food in the  Last Year: Never true  Transportation Needs: No Transportation Needs (06/13/2021)   PRAPARE - Hydrologist (Medical): No    Lack of Transportation (Non-Medical): No  Physical Activity: Unknown (04/23/2021)   Exercise Vital Sign    Days of Exercise per Week: 0 days    Minutes of Exercise per Session: Not on file  Stress: No Stress Concern Present (04/23/2021)   [place]    Feeling of Stress : Only a little  Social Connections: Moderately Integrated (04/23/2021)   Social Connection and Isolation Panel [NHANES]    Frequency of Communication with Friends and Family: Twice a week    Frequency of Social Gatherings with Friends and Family: Twice a week    Attends Religious Services: More than 4 times per year    Active Member of [employer] or Organizations: Yes    Attends Music therapist: More than 4 times per year    Marital Status: Divorced  Human resources officer Violence: Not on file   Family History:  Family History  Problem Relation Age of Onset   Diabetes type II Mother    Kidney failure Sister    Diabetes Mellitus II Sister    Colon cancer Maternal Aunt        aunts and  uncles   Diabetes Mellitus II Maternal Aunt    Pancreatic cancer Maternal Aunt    Colon cancer Maternal Uncle    Diabetes Mellitus II Maternal Uncle    Heart disease Maternal Grandmother        aunts, uncles, sister   Diabetes Mellitus II Maternal Grandmother    Lung cancer Other        aunts and uncles    Review of Systems: Constitutional: poor appetite Eyes: blurriness of vision Ears, nose, mouth, throat, and face: Doesn't report sore throat Respiratory: Doesn't report cough, dyspnea or wheezes Cardiovascular: Doesn't report palpitation, chest discomfort  Gastrointestinal:  Doesn't report nausea, constipation, diarrhea GU: Doesn't report incontinence Skin: Doesn't report skin rashes Neurological: Per HPI Musculoskeletal: bilateral shoulder pain Behavioral/Psych: +anxiety  Physical Exam: Vitals:   03/07/22 1124  BP: (!) 142/93  Pulse: 75  Resp: 18  Temp: 97.8 F (36.6 C)  SpO2: 100%   KPS: 70. General: Alert, cooperative, pleasant, in no acute distress Head: Normal EENT: No conjunctival injection or scleral icterus.  Lungs: Resp effort normal Cardiac: Regular rate Abdomen: Non-distended abdomen Skin: No rashes cyanosis or petechiae. Extremities: No clubbing or edema  Neurologic Exam: Mental Status: Awake, alert, attentive to examiner. Oriented to self and environment. Language is notable for modest impairment in fluency.  Impaired short term recall. Cranial Nerves: Visual acuity is grossly normal. Visual fields are full. Extra-ocular movements intact. No ptosis. Face L LMN paresis, chronic. Motor: Tone and bulk are normal. Power is full in both arms and legs. Reflexes are symmetric, no pathologic reflexes present.  Sensory: Intact to light touch Gait: Normal.   Labs: I have reviewed the data as listed    Component Value Date/Time   NA 138 02/21/2022 1008   K 3.4 (L) 02/21/2022 1008   CL 103 02/21/2022 1008   CO2 27 02/21/2022 1008   GLUCOSE 77 02/21/2022  1008   BUN 17 02/21/2022 1008   CREATININE 0.87 02/21/2022 1008   CREATININE 0.80 02/07/2022 1121   CREATININE 0.92 01/25/2020 1424   CALCIUM 9.5 02/21/2022 1008   PROT 6.9 02/21/2022 1008   ALBUMIN 4.3 02/21/2022 1008  AST 11 (L) 02/21/2022 1008   AST 11 (L) 02/07/2022 1121   ALT 11 02/21/2022 1008   ALT 12 02/07/2022 1121   ALKPHOS 112 02/21/2022 1008   BILITOT 0.8 02/21/2022 1008   BILITOT 0.7 02/07/2022 1121   GFRNONAA >60 02/21/2022 1008   GFRNONAA >60 02/07/2022 1121   GFRNONAA 72 01/25/2020 1424   GFRAA 83 01/25/2020 1424   Lab Results  Component Value Date   WBC 6.8 03/07/2022   NEUTROABS 5.0 03/07/2022   HGB 12.7 03/07/2022   HCT 38.5 03/07/2022   MCV 100.0 03/07/2022   PLT 184 03/07/2022    Assessment/Plan 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[place]) [C71.9]  [name] is clinically stable today.  Labs demonstrate improvement in blood counts, broadly.  We will proceed with both irinotecan and avastin today.  Zarxio will be given for bone marrow support tomorrow, once s/p 24h from infusion.  Chemotherapy should be held for the following:  ANC less than 1,000  Platelets less than 100,000  LFT or creatinine greater than 2x ULN  If clinical concerns/contraindications develop  Avastin should be held for the following:  ANC less than 500  Platelets less than 50,000  LFT or creatinine greater than 2x ULN  If clinical concerns/contraindications develop  For seizures, will con't Vimpat 100/50 and con't Keppra 1556m BID.    Will decrease decadron to 131mdaily.  Should continue to dose 2081mpotassium daily.  Will visit with Dr. FenBurr Medicoday to review CT abdomen results.  We ask that [name] CYPERTturn to clinic in 2 weeks with MRI brain for evaluation, prior to next scheduled infusion of CPT-11, Avastin and labs.  Zarxio will be given again tomorrow.    All questions were answered. The patient knows to call the clinic  with any problems, questions or concerns. No barriers to learning were detected.  The total time spent in the encounter was 30 minutes and more than 50% was on counseling and review of test results   ZacVentura SellersD Medical Director of Neuro-Oncology [facility] [place]/02/23 11:28 AM

## 2022-03-08 ENCOUNTER — Inpatient Hospital Stay: Payer: BC Managed Care – PPO

## 2022-03-08 VITALS — BP 126/87 | HR 82 | Temp 98.3°F | Resp 16

## 2022-03-08 DIAGNOSIS — D701 Agranulocytosis secondary to cancer chemotherapy: Secondary | ICD-10-CM

## 2022-03-08 DIAGNOSIS — Z800 Family history of malignant neoplasm of digestive organs: Secondary | ICD-10-CM | POA: Diagnosis not present

## 2022-03-08 DIAGNOSIS — Z801 Family history of malignant neoplasm of trachea, bronchus and lung: Secondary | ICD-10-CM | POA: Diagnosis not present

## 2022-03-08 DIAGNOSIS — Z5189 Encounter for other specified aftercare: Secondary | ICD-10-CM | POA: Diagnosis not present

## 2022-03-08 DIAGNOSIS — C712 Malignant neoplasm of temporal lobe: Secondary | ICD-10-CM | POA: Diagnosis not present

## 2022-03-08 DIAGNOSIS — Z5111 Encounter for antineoplastic chemotherapy: Secondary | ICD-10-CM | POA: Diagnosis not present

## 2022-03-08 DIAGNOSIS — Z90710 Acquired absence of both cervix and uterus: Secondary | ICD-10-CM | POA: Diagnosis not present

## 2022-03-08 DIAGNOSIS — R569 Unspecified convulsions: Secondary | ICD-10-CM | POA: Diagnosis not present

## 2022-03-08 DIAGNOSIS — I10 Essential (primary) hypertension: Secondary | ICD-10-CM | POA: Diagnosis not present

## 2022-03-08 MED ORDER — FILGRASTIM-SNDZ 300 MCG/0.5ML IJ SOSY
300.0000 ug | PREFILLED_SYRINGE | Freq: Once | INTRAMUSCULAR | Status: AC
  Administered 2022-03-08: 300 ug via SUBCUTANEOUS
  Filled 2022-03-08: qty 0.5

## 2022-03-08 NOTE — Patient Instructions (Signed)
Filgrastim Injection What is this medication? FILGRASTIM (fil GRA stim) lowers the risk of infection in people who are receiving chemotherapy. It works by helping your body make more white blood cells, which protects your body from infection. It may also be used to help people who have been exposed to high doses of radiation. It can be used to help prepare your body before a stem cell transplant. It works by helping your bone marrow make and release stem cells into the blood. This medicine may be used for other purposes; ask your health care provider or pharmacist if you have questions. COMMON BRAND NAME(S): Neupogen, Nivestym, Releuko, Zarxio What should I tell my care team before I take this medication? They need to know if you have any of these conditions: History of blood diseases, such as sickle cell anemia Kidney disease Recent or ongoing radiation An unusual or allergic reaction to filgrastim, pegfilgrastim, latex, rubber, other medications, foods, dyes, or preservatives Pregnant or trying to get pregnant Breast-feeding How should I use this medication? This medication is injected under the skin or into a vein. It is usually given by your care team in a hospital or clinic setting. It may be given at home. If you get this medication at home, you will be taught how to prepare and give it. Use exactly as directed. Take it as directed on the prescription label at the same time every day. Keep taking it unless your care team tells you to stop. It is important that you put your used needles and syringes in a special sharps container. Do not put them in a trash can. If you do not have a sharps container, call your pharmacist or care team to get one. This medication comes with INSTRUCTIONS FOR USE. Ask your pharmacist for directions on how to use this medication. Read the information carefully. Talk to your pharmacist or care team if you have questions. Talk to your care team about the use of this  medication in children. While it may be prescribed for children for selected conditions, precautions do apply. Overdosage: If you think you have taken too much of this medicine contact a poison control center or emergency room at once. NOTE: This medicine is only for you. Do not share this medicine with others. What if I miss a dose? It is important not to miss any doses. Talk to your care team about what to do if you miss a dose. What may interact with this medication? Medications that may cause a release of neutrophils, such as lithium This list may not describe all possible interactions. Give your health care provider a list of all the medicines, herbs, non-prescription drugs, or dietary supplements you use. Also tell them if you smoke, drink alcohol, or use illegal drugs. Some items may interact with your medicine. What should I watch for while using this medication? Your condition will be monitored carefully while you are receiving this medication. You may need bloodwork while taking this medication. Talk to your care team about your risk of cancer. You may be more at risk for certain types of cancer if you take this medication. What side effects may I notice from receiving this medication? Side effects that you should report to your care team as soon as possible: Allergic reactions--skin rash, itching, hives, swelling of the face, lips, tongue, or throat Capillary leak syndrome--stomach or muscle pain, unusual weakness or fatigue, feeling faint or lightheaded, decrease in the amount of urine, swelling of the ankles, hands, or   feet, trouble breathing High white blood cell level--fever, fatigue, trouble breathing, night sweats, change in vision, weight loss Inflammation of the aorta--fever, fatigue, back, chest, or stomach pain, severe headache Kidney injury (glomerulonephritis)--decrease in the amount of urine, red or dark brown urine, foamy or bubbly urine, swelling of the ankles, hands, or  feet Shortness of breath or trouble breathing Spleen injury--pain in upper left stomach or shoulder Unusual bruising or bleeding Side effects that usually do not require medical attention (report to your care team if they continue or are bothersome): Back pain Bone pain Fatigue Fever Headache Nausea This list may not describe all possible side effects. Call your doctor for medical advice about side effects. You may report side effects to FDA at 1-800-FDA-1088. Where should I keep my medication? Keep out of the reach of children and pets. Keep this medication in the original packaging until you are ready to take it. Protect from light. See product for storage information. Each product may have different instructions. Get rid of any unused medication after the expiration date. To get rid of medications that are no longer needed or have expired: Take the medication to a medications take-back program. Check with your pharmacy or law enforcement to find a location. If you cannot return the medication, ask your pharmacist or care team how to get rid of this medication safely. NOTE: This sheet is a summary. It may not cover all possible information. If you have questions about this medicine, talk to your doctor, pharmacist, or health care provider.  2023 Elsevier/Gold Standard (2021-07-31 00:00:00)  

## 2022-03-12 ENCOUNTER — Telehealth: Payer: Self-pay

## 2022-03-12 NOTE — Telephone Encounter (Signed)
Call placed to [facility] Request Team at [phone] regarding Incident Number [id number] for Long term Disability. Medical Request Team confirmed receipt of requested records and Medical Request Form completed by Dr. Mickeal Skinner. No other information is needed at this time. Patient completed Radiation treatments 02/07/2021 and is currently receiving Infusions every 28 days and treated by Dr. Mickeal Skinner.

## 2022-03-14 ENCOUNTER — Ambulatory Visit (HOSPITAL_COMMUNITY)
Admission: RE | Admit: 2022-03-14 | Discharge: 2022-03-14 | Disposition: A | Discharge status: Home / Self Care | Payer: BC Managed Care – PPO | Source: Ambulatory Visit | Attending: Internal Medicine | Admitting: Internal Medicine

## 2022-03-14 DIAGNOSIS — C719 Malignant neoplasm of brain, unspecified: Secondary | ICD-10-CM | POA: Diagnosis not present

## 2022-03-14 DIAGNOSIS — H0520 Unspecified exophthalmos: Secondary | ICD-10-CM | POA: Diagnosis not present

## 2022-03-14 MED ORDER — GADOBUTROL 1 MMOL/ML IV SOLN
7.0000 mL | Freq: Once | INTRAVENOUS | Status: AC | PRN
  Administered 2022-03-14: 7 mL via INTRAVENOUS

## 2022-03-18 ENCOUNTER — Inpatient Hospital Stay: Payer: BC Managed Care – PPO

## 2022-03-20 ENCOUNTER — Other Ambulatory Visit: Payer: Self-pay | Admitting: *Deleted

## 2022-03-20 DIAGNOSIS — C719 Malignant neoplasm of brain, unspecified: Secondary | ICD-10-CM

## 2022-03-21 ENCOUNTER — Other Ambulatory Visit: Payer: Self-pay | Admitting: Internal Medicine

## 2022-03-21 ENCOUNTER — Inpatient Hospital Stay (HOSPITAL_BASED_OUTPATIENT_CLINIC_OR_DEPARTMENT_OTHER): Payer: BC Managed Care – PPO | Admitting: Internal Medicine

## 2022-03-21 ENCOUNTER — Inpatient Hospital Stay: Payer: BC Managed Care – PPO

## 2022-03-21 ENCOUNTER — Other Ambulatory Visit: Payer: Self-pay

## 2022-03-21 VITALS — BP 138/88 | HR 82 | Temp 97.5°F | Resp 18 | Wt 161.7 lb

## 2022-03-21 DIAGNOSIS — C719 Malignant neoplasm of brain, unspecified: Secondary | ICD-10-CM

## 2022-03-21 DIAGNOSIS — Z800 Family history of malignant neoplasm of digestive organs: Secondary | ICD-10-CM | POA: Diagnosis not present

## 2022-03-21 DIAGNOSIS — R569 Unspecified convulsions: Secondary | ICD-10-CM

## 2022-03-21 DIAGNOSIS — Z5111 Encounter for antineoplastic chemotherapy: Secondary | ICD-10-CM | POA: Diagnosis not present

## 2022-03-21 DIAGNOSIS — I10 Essential (primary) hypertension: Secondary | ICD-10-CM | POA: Diagnosis not present

## 2022-03-21 DIAGNOSIS — Z5189 Encounter for other specified aftercare: Secondary | ICD-10-CM | POA: Diagnosis not present

## 2022-03-21 DIAGNOSIS — C712 Malignant neoplasm of temporal lobe: Secondary | ICD-10-CM | POA: Diagnosis not present

## 2022-03-21 DIAGNOSIS — Z801 Family history of malignant neoplasm of trachea, bronchus and lung: Secondary | ICD-10-CM | POA: Diagnosis not present

## 2022-03-21 DIAGNOSIS — Z90710 Acquired absence of both cervix and uterus: Secondary | ICD-10-CM | POA: Diagnosis not present

## 2022-03-21 LAB — CBC WITH DIFFERENTIAL (CANCER CENTER ONLY)
Abs Immature Granulocytes: 0.01 10*3/uL (ref 0.00–0.07)
Basophils Absolute: 0 10*3/uL (ref 0.0–0.1)
Basophils Relative: 0 %
Eosinophils Absolute: 0.1 10*3/uL (ref 0.0–0.5)
Eosinophils Relative: 2 %
HCT: 34.2 % — ABNORMAL LOW (ref 36.0–46.0)
Hemoglobin: 11.1 g/dL — ABNORMAL LOW (ref 12.0–15.0)
Immature Granulocytes: 0 %
Lymphocytes Relative: 53 %
Lymphs Abs: 1.2 10*3/uL (ref 0.7–4.0)
MCH: 32.5 pg (ref 26.0–34.0)
MCHC: 32.5 g/dL (ref 30.0–36.0)
MCV: 100 fL (ref 80.0–100.0)
Monocytes Absolute: 0.3 10*3/uL (ref 0.1–1.0)
Monocytes Relative: 14 %
Neutro Abs: 0.7 10*3/uL — ABNORMAL LOW (ref 1.7–7.7)
Neutrophils Relative %: 31 %
Platelet Count: 144 10*3/uL — ABNORMAL LOW (ref 150–400)
RBC: 3.42 MIL/uL — ABNORMAL LOW (ref 3.87–5.11)
RDW: 15.6 % — ABNORMAL HIGH (ref 11.5–15.5)
Smear Review: NORMAL
WBC Count: 2.3 10*3/uL — ABNORMAL LOW (ref 4.0–10.5)
nRBC: 3 % — ABNORMAL HIGH (ref 0.0–0.2)

## 2022-03-21 LAB — CMP (CANCER CENTER ONLY)
ALT: 16 U/L (ref 0–44)
AST: 11 U/L — ABNORMAL LOW (ref 15–41)
Albumin: 4.2 g/dL (ref 3.5–5.0)
Alkaline Phosphatase: 111 U/L (ref 38–126)
Anion gap: 7 (ref 5–15)
BUN: 12 mg/dL (ref 6–20)
CO2: 27 mmol/L (ref 22–32)
Calcium: 9.6 mg/dL (ref 8.9–10.3)
Chloride: 105 mmol/L (ref 98–111)
Creatinine: 0.81 mg/dL (ref 0.44–1.00)
GFR, Estimated: >60 mL/min (ref >60)
Glucose, Bld: 106 mg/dL — ABNORMAL HIGH (ref 70–99)
Potassium: 3.6 mmol/L (ref 3.5–5.1)
Sodium: 139 mmol/L (ref 135–145)
Total Bilirubin: 0.6 mg/dL (ref 0.3–1.2)
Total Protein: 7 g/dL (ref 6.5–8.1)

## 2022-03-21 MED ORDER — GABAPENTIN 100 MG PO CAPS: 100.0000 mg | ORAL_CAPSULE | Freq: Two times a day (BID) | ORAL | 3 refills | Status: DC

## 2022-03-21 NOTE — Progress Notes (Signed)
[facility] [place], [place] [address] [phone]   Interval Evaluation  Date of Service: 03/21/22 Patient Name: [name] Patient MRN: [id number] Patient DOB: 11-Sep-1965 Provider: Ventura Sellers, MD  Identifying Statement:  [name] is a 56 y.o. female with left temporal glioblastoma   Oncologic History: Oncology History  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[place])  11/21/2020 Initial Diagnosis   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[facility])   11/21/2020 Cancer Staging   Staging form: Brain and Spinal Cord, AJCC 8th Edition - Pathologic stage from 11/21/2020: WHO Grade IV - Signed by Ventura Sellers, MD on 11/30/2020 Histopathologic type: Glioblastoma Stage prefix: Initial diagnosis Histologic grading system: 4 grade system Extent of surgical resection: Subtotal resection Karnofsky performance status: Score 90 Seizures at presentation: Present Duration of symptoms before diagnosis: Short IDH1 mutation: Negative   12/27/2020 - 02/07/2021 Radiation Therapy   IMRT and concurrent Temodar Isidore Moos)   03/12/2021 - 08/26/2021 Chemotherapy   Completes 5 cycles of adjuvant 5-day Temodar   08/27/2021 Progression   POD #1   09/06/2021 -  Chemotherapy   Initiates second line therapy; CCNU 30m/m2 PO q6 weeks and avastin 182mkg IV q2 weeks   12/28/2021 Progression   POD #2   01/17/2022 -  Chemotherapy   Third line therapy with Irinotecan and Avastin q2 weeks     Biomarkers:  MGMT Methylated.  IDH 1/2 Wild type.  EGFR Unknown  TERT Unknown   Interval History: [name] today following recent MRI brain.  No clinical changes today, no reported recurrence of diarrhea.  She does describe some pain, burning type, in the back of her at times which is bothersome.  Also some pressure on the left side of her head, comes and goes as prior.  No other new or  progressive complaints, no further seizures.  Short term memory impairment and fatigue are still persistent.       H+P (11/25/20) Patient presented to medical attention this past month with first ever seizure.  She describes episode of sudden onset "disconnection" with tongue biting and urination, followed by period of confusion.  CNS imaging demonstrated enhancing mass within left anterior temporal lobe, which was mostly resected by Dr. [name] 11/21/20.  Since surgery she had not had recurrence of seizures.  She complains of painful cramping of her right leg, which started ~1 week prior.  At this time the cramping is her main complaint.  Otherwise fully functional, independent, would like to return to work if possible.  Medications: Current Outpatient Medications on File Prior to Visit  Medication Sig Dispense Refill   amLODipine (NORVASC) 10 MG tablet TAKE 1 TABLET BY MOUTH EVERY DAY 90 tablet 1   dexamethasone (DECADRON) 2 MG tablet Take 1 tablet (2 mg total) by mouth daily. 30 tablet 1   lacosamide (VIMPAT) 50 MG TABS tablet Take 2 tablets (100 mg total) by mouth every morning AND 1 tablet (50 mg total) at bedtime. 90 tablet 3   levETIRAcetam (KEPPRA) 750 MG tablet Take 2 tablets (1,500 mg total) by mouth 2 (two) times daily. 360 tablet 1   levothyroxine (SYNTHROID) 100 MCG tablet Take 1 tablet (100 mcg total) by mouth daily. 90 tablet 3   LORazepam (ATIVAN) 2 MG tablet Take 1 tablet (2 mg total) by mouth every 8 (eight) hours as needed for seizure. (Patient not taking: Reported on 01/31/2022) 12 tablet 0   ondansetron (  ZOFRAN-ODT) 4 MG disintegrating tablet Take 1 tablet (4 mg total) by mouth every 8 (eight) hours as needed for nausea or vomiting. (Patient not taking: Reported on 01/31/2022) 10 tablet 0   Potassium Chloride ER 20 MEQ TBCR TAKE 1 TABLET BY MOUTH EVERY DAY 90 tablet 3   prochlorperazine (COMPAZINE) 10 MG tablet Take 10 mg by mouth every 6 (six) hours as needed for nausea or  vomiting.     No current facility-administered medications on file prior to visit.    Allergies: No Known Allergies Past Medical History:  Past Medical History:  Diagnosis Date   Bell's palsy    left side of face- notied in smile and eyes, if very tired   Brain cancer ([facility])    Complication of anesthesia    N/V   Essential hypertension    External hemorrhoids    History of blood transfusion    post op Hemorroids   History of pneumonia    Hyperlipidemia    Hyperthyroidism    hx 56 years old- oral meds   Iron deficiency anemia    Migraine headache    Pneumonia    x 2 - years ago   PONV (postoperative nausea and vomiting)    PPD positive    Pulmonary nodules    not seen on plain cxr, first detected 1/07 re ct 10/08 pos IPPD > 15 mm 12/09 FOB/lavage 04/20/08 neg afb smear   PVC's (premature ventricular contractions)    Seizure ([facility]) 11/15/2020   Sleep apnea    refused CPAP- mouth piece recommended   Tuberculosis    in her late 30's   Past Surgical History:  Past Surgical History:  Procedure Laterality Date   ABDOMINAL HYSTERECTOMY     APPLICATION OF CRANIAL NAVIGATION N/A 11/21/2020   Procedure: APPLICATION OF CRANIAL NAVIGATION;  Surgeon: Judith Part, MD;  Location: [facility];  Service: Neurosurgery;  Laterality: N/A;   BREAST BIOPSY Right 2014   BREAST BIOPSY Left 10/29/2019   CHOLECYSTECTOMY  2014   COLONOSCOPY  05/04/2009   prolapsing external hemorrhoids   CRANIOTOMY Left 11/21/2020   Procedure: Left craniotomy for tumor resection;  Surgeon: Judith Part, MD;  Location: [facility];  Service: Neurosurgery;  Laterality: Left;   ENDOMETRIAL ABLATION     HEMORRHOID SURGERY     x 3   TUBAL LIGATION     Social History:  Social History   Socioeconomic History   Marital status: Divorced    Spouse name: Not on file   Number of children: 2   Years of education: Not on file   Highest education level: Associate degree: occupational, Hotel manager, or vocational  program  Occupational History   Occupation: accounts Sport and exercise psychologist: [employer]  Tobacco Use   Smoking status: Never   Smokeless tobacco: Never  Vaping Use   Vaping Use: Never used  Substance and Sexual Activity   Alcohol use: No    Alcohol/week: 0.0 standard drinks of alcohol   Drug use: No   Sexual activity: Not on file  Other Topics Concern   Not on file  Social History Narrative   Divorced with 2 children   She works as a Librarian, 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Press photographer    Possible TB exposure 2008 (after nodules found)   From [place] lived there and [place]   Never owned birds      Social Determinants of [place] Strain: Medium Risk (06/13/2021)   Overall Financial Resource Strain ([place])  Difficulty of Paying Living Expenses: Somewhat hard  Food Insecurity: Food Insecurity Present (04/23/2021)   Hunger Vital Sign    Worried About Running Out of Food in the Last Year: Sometimes true    Ran Out of Food in the Last Year: Never true  Transportation Needs: No Transportation Needs (06/13/2021)   PRAPARE - Hydrologist (Medical): No    Lack of Transportation (Non-Medical): No  Physical Activity: Unknown (04/23/2021)   Exercise Vital Sign    Days of Exercise per Week: 0 days    Minutes of Exercise per Session: Not on file  Stress: No Stress Concern Present (04/23/2021)   [place]    Feeling of Stress : Only a little  Social Connections: Moderately Integrated (04/23/2021)   Social Connection and Isolation Panel [NHANES]    Frequency of Communication with Friends and Family: Twice a week    Frequency of Social Gatherings with Friends and Family: Twice a week    Attends Religious Services: More than 4 times per year    Active Member of [employer] or Organizations: Yes    Attends Music therapist: More than 4 times per year    Marital Status: Divorced  Human resources officer Violence:  Not on file   Family History:  Family History  Problem Relation Age of Onset   Diabetes type II Mother    Kidney failure Sister    Diabetes Mellitus II Sister    Colon cancer Maternal Aunt        aunts and uncles   Diabetes Mellitus II Maternal Aunt    Pancreatic cancer Maternal Aunt    Colon cancer Maternal Uncle    Diabetes Mellitus II Maternal Uncle    Heart disease Maternal Grandmother        aunts, uncles, sister   Diabetes Mellitus II Maternal Grandmother    Lung cancer Other        aunts and uncles    Review of Systems: Constitutional: poor appetite Eyes: blurriness of vision Ears, nose, mouth, throat, and face: Doesn't report sore throat Respiratory: Doesn't report cough, dyspnea or wheezes Cardiovascular: Doesn't report palpitation, chest discomfort  Gastrointestinal:  Doesn't report nausea, constipation, diarrhea GU: Doesn't report incontinence Skin: Doesn't report skin rashes Neurological: Per HPI Musculoskeletal: bilateral shoulder pain Behavioral/Psych: +anxiety  Physical Exam: Vitals:   03/21/22 0926  BP: 138/88  Pulse: 82  Resp: 18  Temp: (!) 97.5 F (36.4 C)  SpO2: 100%    KPS: 70. General: Alert, cooperative, pleasant, in no acute distress Head: Normal EENT: No conjunctival injection or scleral icterus.  Lungs: Resp effort normal Cardiac: Regular rate Abdomen: Non-distended abdomen Skin: No rashes cyanosis or petechiae. Extremities: No clubbing or edema  Neurologic Exam: Mental Status: Awake, alert, attentive to examiner. Oriented to self and environment. Language is notable for modest impairment in fluency.  Impaired short term recall. Cranial Nerves: Visual acuity is grossly normal. Visual fields are full. Extra-ocular movements intact. No ptosis. Face L LMN paresis, chronic. Motor: Tone and bulk are normal. Power is full in both arms and legs. Reflexes are symmetric, no pathologic reflexes present.  Sensory: Intact to light touch Gait:  Normal.   Labs: I have reviewed the data as listed    Component Value Date/Time   NA 138 03/07/2022 1111   K 4.0 03/07/2022 1111   CL 103 03/07/2022 1111   CO2 27 03/07/2022 1111   GLUCOSE 93 03/07/2022  1111   BUN 17 03/07/2022 1111   CREATININE 0.89 03/07/2022 1111   CREATININE 0.92 01/25/2020 1424   CALCIUM 10.0 03/07/2022 1111   PROT 7.6 03/07/2022 1111   ALBUMIN 4.2 03/07/2022 1111   AST 18 03/07/2022 1111   ALT 61 (H) 03/07/2022 1111   ALKPHOS 117 03/07/2022 1111   BILITOT 0.8 03/07/2022 1111   GFRNONAA >60 03/07/2022 1111   GFRNONAA 72 01/25/2020 1424   GFRAA 83 01/25/2020 1424   Lab Results  Component Value Date   WBC 2.3 (L) 03/21/2022   NEUTROABS PENDING 03/21/2022   HGB 11.1 (L) 03/21/2022   HCT 34.2 (L) 03/21/2022   MCV 100.0 03/21/2022   PLT 144 (L) 03/21/2022   Imaging:  [facility] Clinician Interpretation: I have personally reviewed the CNS images as listed.  My interpretation, in the context of the patient's clinical presentation, is stable disease  MR BRAIN W WO CONTRAST  Result Date: 03/16/2022 CLINICAL DATA:  Provided history: Glioblastoma with isocitrate dehydrogenase gene wild type. Brain/CNS neoplasm, assess treatment response. EXAM: MRI HEAD WITHOUT AND WITH CONTRAST TECHNIQUE: Multiplanar, multiecho pulse sequences of the brain and surrounding structures were obtained without and with intravenous contrast. CONTRAST:  34m GADAVIST GADOBUTROL 1 MMOL/ML IV SOLN COMPARISON:  Prior brain MRI examinations 12/28/2021 and earlier. Head CT 12/28/2021 FINDINGS: Brain: No age advanced or lobar predominant parenchymal atrophy. Redemonstrated postoperative changes from partial resection of the component of the mass in the anterior left temporal lobe. A focus of restricted diffusion along the medial aspect of the left lateral ventricle atrium has increased in size, now measuring 9 mm (previously measuring 6 mm) (series 5, image 22). Additionally, there are multiple new  subcentimeter foci of restricted diffusion within the left parietal and periatrial white matter (for instance as seen on series 5, images 23-28). There is precontrast T1 hyperintensity, and probable faint enhancement, associated with some of these lesions. Foci of restricted diffusion elsewhere within the left parietal white matter, within the medial left temporal lobe, within the left retrolenticular white matter and along the frontal horn of the left lateral ventricle have not significantly changed. Progressive irregular precontrast T1 hyperintense signal abnormality within the medial left temporal lobe and left retrolenticular white matter, compatible with mineralization and possible non-acute hemorrhage. Similar foci signal abnormality along the left lateral ventricle frontal horn, unchanged. As before, there are superimposed foci of faint enhancement at these sites. T2 FLAIR hyperintense signal abnormality has subtly progressed within the anterior left frontal white matter (series 9, image 26). Extensive T2 FLAIR hyperintense signal abnormality elsewhere within the cerebral hemisphere has not significantly changed. There are a few punctate chronic microhemorrhages scattered within the supratentorial brain. No progressive mass effect. No midline shift. No extra-axial fluid collection. Vascular: Maintained flow voids within the proximal large arterial vessels. Skull and upper cervical spine: No focal suspicious marrow lesion. Prior left-sided craniotomy and craniectomy. Sinuses/Orbits: Bilateral proptosis. 17 mm mucous retention cyst, and background mild mucosal thickening, within the right maxillary sinus. 7 mm mucous retention cyst within the left maxillary sinus. Mild mucosal thickening within the bilateral ethmoid and right frontal sinuses. IMPRESSION: 1. New and enlarging foci of restricted diffusion (with probable faint enhancement) in the left parietal and periatrial white matter, as described and  concerning for tumor progression. 2. Subtle progression of T2 FLAIR hyperintense signal abnormality within the anterior left frontal white matter without new mass effect. This is favored to reflect progressive post-treatment changes. However, attention is recommended on imaging follow-up. 3. Progressive  irregular foci of pre-contrast T1 hyperintense signal abnormality within portions of the mass in the medial left temporal lobe and left retrolenticular white matter. Findings are compatible with progressive mineralization, and possible non-acute hemorrhage. 4. Otherwise stable appearance of the left cerebral mass. 5. Paranasal sinus disease, as described. Electronically Signed   By: Kellie Simmering D.O.   On: 03/16/2022 16:15   CT ABD PELVIS W/WO CM ONCOLOGY PANCREATIC PROTOCOL  Result Date: 03/07/2022 CLINICAL DATA:  Follow-up pancreatic cancer.  * Tracking Code: BO * EXAM: CT ABDOMEN AND PELVIS WITHOUT AND WITH CONTRAST TECHNIQUE: Multidetector CT imaging of the abdomen and pelvis was performed following the standard protocol before and following the bolus administration of intravenous contrast. RADIATION DOSE REDUCTION: This exam was performed according to the departmental dose-optimization program which includes automated exposure control, adjustment of the mA and/or kV according to patient size and/or use of iterative reconstruction technique. CONTRAST:  134m OMNIPAQUE IOHEXOL 300 MG/ML  SOLN COMPARISON:  Multiple prior imaging studies. The most recent is 02/16/2022 02/16/2022 FINDINGS: Lower chest: The lung bases are clear of acute process. No pleural effusion or pulmonary lesions. The heart is normal in size. No pericardial effusion. The distal esophagus and aorta are unremarkable. Hepatobiliary: No hepatic lesions or intrahepatic biliary dilatation. The gallbladder is surgically absent. No common bile duct dilatation. Pancreas: There is a stable smoothly marginated and well circumscribed lobular lesion  projecting off the anterior aspect of the lower pancreatic head. This measures a maximum a 17 mm. This lesion has been present since 2014 and is considered benign. I believe this is most likely a normal variant of pancreatic anatomy. Spleen: Normal size.  No focal lesions. Adrenals/Urinary Tract: The adrenal glands and kidneys are normal. Stomach/Bowel: The stomach, duodenum, visualized small bowel and visualized colon unremarkable. Vascular/Lymphatic: The aorta and branch vessels are stable. Moderate age advanced atherosclerotic calcifications involving the distal aorta. No mesenteric or retroperitoneal adenopathy. Reproductive: The uterus is surgically absent. I believe the right ovary is still present to is unremarkable. Other: No pelvic mass or adenopathy. No free pelvic fluid collections. No inguinal mass or adenopathy. No abdominal wall hernia or subcutaneous lesions. Musculoskeletal: No significant bony findings. IMPRESSION: 1. Stable 17 mm smoothly marginated and well circumscribed lobular lesion projecting off the anterior aspect of the lower pancreatic head. This has been present since 2014 and is considered benign. 2. No abdominal/pelvic lymphadenopathy. 3. Status post cholecystectomy. No biliary dilatation. 4. Age advanced atherosclerotic calcifications involving the distal aorta. Aortic Atherosclerosis (ICD10-I70.0). Electronically Signed   By: PMarijo SanesM.D.   On: 03/07/2022 09:11     Assessment/Plan 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[facility] [C71.9]  Danilynn MRICHANDA DARINis clinically stable today.  MRI demonstrates essentially stable findings, DWI signal increase may be secondary to avastin or radiation necrosis rather than tumor progression.  Because of neutropenia, we will hold both irinotecan and avastin today.  Zarxio will be given for bone marrow support tomorrow.  Chemotherapy should be held for the following:  ANC less than 1,000  Platelets less than  100,000  LFT or creatinine greater than 2x ULN  If clinical concerns/contraindications develop  Avastin should be held for the following:  ANC less than 500  Platelets less than 50,000  LFT or creatinine greater than 2x ULN  If clinical concerns/contraindications develop  For seizures, will con't Vimpat 100/50 and con't Keppra 15033mBID.    Will discontinue standing decadron.  Ok with trial of gabapentin 10074mID for neuropathic  head/neck pain.  Should continue to dose 58mq potassium daily.  We ask that TAOLANIS CRISPENreturn to clinic in 2 weeks wprior to next scheduled infusion of CPT-11, Avastin and labs.  Zarxio will be given again tomorrow.    All questions were answered. The patient knows to call the clinic with any problems, questions or concerns. No barriers to learning were detected.  The total time spent in the encounter was 40 minutes and more than 50% was on counseling and review of test results   ZVentura Sellers MD Medical Director of Neuro-Oncology [facility] WMelody Hill11/16/23 9:23 AM

## 2022-03-22 ENCOUNTER — Inpatient Hospital Stay: Payer: BC Managed Care – PPO

## 2022-03-22 ENCOUNTER — Telehealth: Payer: Self-pay | Admitting: Internal Medicine

## 2022-03-22 VITALS — BP 141/96 | HR 89 | Resp 18

## 2022-03-22 DIAGNOSIS — D701 Agranulocytosis secondary to cancer chemotherapy: Secondary | ICD-10-CM

## 2022-03-22 DIAGNOSIS — C712 Malignant neoplasm of temporal lobe: Secondary | ICD-10-CM | POA: Diagnosis not present

## 2022-03-22 DIAGNOSIS — Z90710 Acquired absence of both cervix and uterus: Secondary | ICD-10-CM | POA: Diagnosis not present

## 2022-03-22 DIAGNOSIS — Z5111 Encounter for antineoplastic chemotherapy: Secondary | ICD-10-CM | POA: Diagnosis not present

## 2022-03-22 DIAGNOSIS — Z5189 Encounter for other specified aftercare: Secondary | ICD-10-CM | POA: Diagnosis not present

## 2022-03-22 DIAGNOSIS — Z800 Family history of malignant neoplasm of digestive organs: Secondary | ICD-10-CM | POA: Diagnosis not present

## 2022-03-22 DIAGNOSIS — I10 Essential (primary) hypertension: Secondary | ICD-10-CM | POA: Diagnosis not present

## 2022-03-22 DIAGNOSIS — R569 Unspecified convulsions: Secondary | ICD-10-CM | POA: Diagnosis not present

## 2022-03-22 DIAGNOSIS — Z801 Family history of malignant neoplasm of trachea, bronchus and lung: Secondary | ICD-10-CM | POA: Diagnosis not present

## 2022-03-22 MED ORDER — PEGFILGRASTIM-CBQV 6 MG/0.6ML ~~LOC~~ SOSY: 6.0000 mg | PREFILLED_SYRINGE | Freq: Once | SUBCUTANEOUS | Status: DC

## 2022-03-22 MED ORDER — FILGRASTIM-SNDZ 300 MCG/0.5ML IJ SOSY
300.0000 ug | PREFILLED_SYRINGE | Freq: Once | INTRAMUSCULAR | Status: AC
  Administered 2022-03-22: 300 ug via SUBCUTANEOUS
  Filled 2022-03-22: qty 0.5

## 2022-03-22 NOTE — Patient Instructions (Addendum)
Filgrastim Injection What is this medication? FILGRASTIM (fil GRA stim) lowers the risk of infection in people who are receiving chemotherapy. It works by helping your body make more white blood cells, which protects your body from infection. It may also be used to help people who have been exposed to high doses of radiation. It can be used to help prepare your body before a stem cell transplant. It works by helping your bone marrow make and release stem cells into the blood. This medicine may be used for other purposes; ask your health care provider or pharmacist if you have questions. COMMON BRAND NAME(S): Neupogen, Nivestym, Releuko, Zarxio What should I tell my care team before I take this medication? They need to know if you have any of these conditions: History of blood diseases, such as sickle cell anemia Kidney disease Recent or ongoing radiation An unusual or allergic reaction to filgrastim, pegfilgrastim, latex, rubber, other medications, foods, dyes, or preservatives Pregnant or trying to get pregnant Breast-feeding How should I use this medication? This medication is injected under the skin or into a vein. It is usually given by your care team in a hospital or clinic setting. It may be given at home. If you get this medication at home, you will be taught how to prepare and give it. Use exactly as directed. Take it as directed on the prescription label at the same time every day. Keep taking it unless your care team tells you to stop. It is important that you put your used needles and syringes in a special sharps container. Do not put them in a trash can. If you do not have a sharps container, call your pharmacist or care team to get one. This medication comes with INSTRUCTIONS FOR USE. Ask your pharmacist for directions on how to use this medication. Read the information carefully. Talk to your pharmacist or care team if you have questions. Talk to your care team about the use of this  medication in children. While it may be prescribed for children for selected conditions, precautions do apply. Overdosage: If you think you have taken too much of this medicine contact a poison control center or emergency room at once. NOTE: This medicine is only for you. Do not share this medicine with others. What if I miss a dose? It is important not to miss any doses. Talk to your care team about what to do if you miss a dose. What may interact with this medication? Medications that may cause a release of neutrophils, such as lithium This list may not describe all possible interactions. Give your health care provider a list of all the medicines, herbs, non-prescription drugs, or dietary supplements you use. Also tell them if you smoke, drink alcohol, or use illegal drugs. Some items may interact with your medicine. What should I watch for while using this medication? Your condition will be monitored carefully while you are receiving this medication. You may need bloodwork while taking this medication. Talk to your care team about your risk of cancer. You may be more at risk for certain types of cancer if you take this medication. What side effects may I notice from receiving this medication? Side effects that you should report to your care team as soon as possible: Allergic reactions--skin rash, itching, hives, swelling of the face, lips, tongue, or throat Capillary leak syndrome--stomach or muscle pain, unusual weakness or fatigue, feeling faint or lightheaded, decrease in the amount of urine, swelling of the ankles, hands, or   feet, trouble breathing High white blood cell level--fever, fatigue, trouble breathing, night sweats, change in vision, weight loss Inflammation of the aorta--fever, fatigue, back, chest, or stomach pain, severe headache Kidney injury (glomerulonephritis)--decrease in the amount of urine, red or dark brown urine, foamy or bubbly urine, swelling of the ankles, hands, or  feet Shortness of breath or trouble breathing Spleen injury--pain in upper left stomach or shoulder Unusual bruising or bleeding Side effects that usually do not require medical attention (report to your care team if they continue or are bothersome): Back pain Bone pain Fatigue Fever Headache Nausea This list may not describe all possible side effects. Call your doctor for medical advice about side effects. You may report side effects to FDA at 1-800-FDA-1088. Where should I keep my medication? Keep out of the reach of children and pets. Keep this medication in the original packaging until you are ready to take it. Protect from light. See product for storage information. Each product may have different instructions. Get rid of any unused medication after the expiration date. To get rid of medications that are no longer needed or have expired: Take the medication to a medications take-back program. Check with your pharmacy or law enforcement to find a location. If you cannot return the medication, ask your pharmacist or care team how to get rid of this medication safely. NOTE: This sheet is a summary. It may not cover all possible information. If you have questions about this medicine, talk to your doctor, pharmacist, or health care provider.  2023 Elsevier/Gold Standard (2021-07-31 00:00:00)  

## 2022-03-22 NOTE — Telephone Encounter (Signed)
Per 11/16 los called and spoke to pt about appointment  

## 2022-03-23 ENCOUNTER — Other Ambulatory Visit: Payer: Self-pay | Admitting: Internal Medicine

## 2022-03-26 ENCOUNTER — Other Ambulatory Visit: Payer: Self-pay | Admitting: Internal Medicine

## 2022-03-29 ENCOUNTER — Telehealth: Payer: Self-pay

## 2022-03-29 ENCOUNTER — Other Ambulatory Visit: Payer: Self-pay

## 2022-03-29 NOTE — Telephone Encounter (Signed)
This nurse received a message from [facility] requesting a refill of Gleostine 100 mg and Gleostine '40mg'$ .  This nurse forwarded the request to provider.  No further questions or concerns at this time.

## 2022-04-02 ENCOUNTER — Encounter: Payer: Self-pay | Admitting: Internal Medicine

## 2022-04-03 ENCOUNTER — Other Ambulatory Visit: Payer: Self-pay

## 2022-04-03 DIAGNOSIS — C719 Malignant neoplasm of brain, unspecified: Secondary | ICD-10-CM

## 2022-04-04 ENCOUNTER — Other Ambulatory Visit: Payer: Self-pay | Admitting: *Deleted

## 2022-04-04 ENCOUNTER — Inpatient Hospital Stay: Payer: BC Managed Care – PPO

## 2022-04-04 ENCOUNTER — Other Ambulatory Visit: Payer: Self-pay

## 2022-04-04 ENCOUNTER — Inpatient Hospital Stay (HOSPITAL_BASED_OUTPATIENT_CLINIC_OR_DEPARTMENT_OTHER): Payer: BC Managed Care – PPO | Admitting: Internal Medicine

## 2022-04-04 VITALS — BP 123/86 | HR 85 | Resp 16

## 2022-04-04 VITALS — BP 149/100 | HR 103 | Temp 99.3°F | Resp 17 | Wt 164.1 lb

## 2022-04-04 DIAGNOSIS — C719 Malignant neoplasm of brain, unspecified: Secondary | ICD-10-CM

## 2022-04-04 DIAGNOSIS — Z5189 Encounter for other specified aftercare: Secondary | ICD-10-CM | POA: Diagnosis not present

## 2022-04-04 DIAGNOSIS — I10 Essential (primary) hypertension: Secondary | ICD-10-CM | POA: Diagnosis not present

## 2022-04-04 DIAGNOSIS — Z800 Family history of malignant neoplasm of digestive organs: Secondary | ICD-10-CM | POA: Diagnosis not present

## 2022-04-04 DIAGNOSIS — Z5111 Encounter for antineoplastic chemotherapy: Secondary | ICD-10-CM | POA: Diagnosis not present

## 2022-04-04 DIAGNOSIS — Z90710 Acquired absence of both cervix and uterus: Secondary | ICD-10-CM | POA: Diagnosis not present

## 2022-04-04 DIAGNOSIS — R569 Unspecified convulsions: Secondary | ICD-10-CM | POA: Diagnosis not present

## 2022-04-04 DIAGNOSIS — C712 Malignant neoplasm of temporal lobe: Secondary | ICD-10-CM | POA: Diagnosis not present

## 2022-04-04 DIAGNOSIS — Z801 Family history of malignant neoplasm of trachea, bronchus and lung: Secondary | ICD-10-CM | POA: Diagnosis not present

## 2022-04-04 LAB — CMP (CANCER CENTER ONLY)
ALT: 38 U/L (ref 0–44)
AST: 18 U/L (ref 15–41)
Albumin: 4.5 g/dL (ref 3.5–5.0)
Alkaline Phosphatase: 113 U/L (ref 38–126)
Anion gap: 7 (ref 5–15)
BUN: 12 mg/dL (ref 6–20)
CO2: 27 mmol/L (ref 22–32)
Calcium: 10 mg/dL (ref 8.9–10.3)
Chloride: 108 mmol/L (ref 98–111)
Creatinine: 0.85 mg/dL (ref 0.44–1.00)
GFR, Estimated: >60 mL/min (ref >60)
Glucose, Bld: 111 mg/dL — ABNORMAL HIGH (ref 70–99)
Potassium: 3.6 mmol/L (ref 3.5–5.1)
Sodium: 142 mmol/L (ref 135–145)
Total Bilirubin: 1 mg/dL (ref 0.3–1.2)
Total Protein: 7.5 g/dL (ref 6.5–8.1)

## 2022-04-04 LAB — CBC WITH DIFFERENTIAL (CANCER CENTER ONLY)
Abs Immature Granulocytes: 0.02 10*3/uL (ref 0.00–0.07)
Basophils Absolute: 0 10*3/uL (ref 0.0–0.1)
Basophils Relative: 0 %
Eosinophils Absolute: 0.1 10*3/uL (ref 0.0–0.5)
Eosinophils Relative: 1 %
HCT: 37.5 % (ref 36.0–46.0)
Hemoglobin: 11.8 g/dL — ABNORMAL LOW (ref 12.0–15.0)
Immature Granulocytes: 0 %
Lymphocytes Relative: 17 %
Lymphs Abs: 0.8 10*3/uL (ref 0.7–4.0)
MCH: 31.9 pg (ref 26.0–34.0)
MCHC: 31.5 g/dL (ref 30.0–36.0)
MCV: 101.4 fL — ABNORMAL HIGH (ref 80.0–100.0)
Monocytes Absolute: 0.3 10*3/uL (ref 0.1–1.0)
Monocytes Relative: 5 %
Neutro Abs: 3.7 10*3/uL (ref 1.7–7.7)
Neutrophils Relative %: 77 %
Platelet Count: 173 10*3/uL (ref 150–400)
RBC: 3.7 MIL/uL — ABNORMAL LOW (ref 3.87–5.11)
RDW: 15.9 % — ABNORMAL HIGH (ref 11.5–15.5)
WBC Count: 4.8 10*3/uL (ref 4.0–10.5)
nRBC: 0 % (ref 0.0–0.2)

## 2022-04-04 LAB — TOTAL PROTEIN, URINE DIPSTICK: Protein, ur: <30 mg/dL — AB

## 2022-04-04 MED ORDER — SODIUM CHLORIDE 0.9 % IV SOLN
10.0000 mg/kg | Freq: Once | INTRAVENOUS | Status: AC
  Administered 2022-04-04: 700 mg via INTRAVENOUS
  Filled 2022-04-04: qty 16

## 2022-04-04 MED ORDER — SODIUM CHLORIDE 0.9 % IV SOLN
10.0000 mg | Freq: Once | INTRAVENOUS | Status: AC
  Administered 2022-04-04: 10 mg via INTRAVENOUS
  Filled 2022-04-04: qty 10

## 2022-04-04 MED ORDER — ATROPINE SULFATE 1 MG/ML IV SOLN
0.5000 mg | Freq: Once | INTRAVENOUS | Status: AC | PRN
  Administered 2022-04-04: 0.5 mg via INTRAVENOUS
  Filled 2022-04-04: qty 1

## 2022-04-04 MED ORDER — SODIUM CHLORIDE 0.9 % IV SOLN: Freq: Once | INTRAVENOUS | Status: AC

## 2022-04-04 MED ORDER — SODIUM CHLORIDE 0.9 % IV SOLN
125.0000 mg/m2 | Freq: Once | INTRAVENOUS | Status: AC
  Administered 2022-04-04: 240 mg via INTRAVENOUS
  Filled 2022-04-04: qty 12

## 2022-04-04 MED ORDER — PALONOSETRON HCL INJECTION 0.25 MG/5ML
0.2500 mg | Freq: Once | INTRAVENOUS | Status: AC
  Administered 2022-04-04: 0.25 mg via INTRAVENOUS
  Filled 2022-04-04: qty 5

## 2022-04-04 NOTE — Patient Instructions (Signed)
[facility]  Discharge Instructions: Thank you for choosing [facility] to provide your oncology and hematology care.   If you have a lab appointment with the [facility], please go directly to the [facility] and check in at the registration area.   Wear comfortable clothing and clothing appropriate for easy access to any Portacath or PICC line.   We strive to give you quality time with your provider. You may need to reschedule your appointment if you arrive late (15 or more minutes).  Arriving late affects you and other patients whose appointments are after yours.  Also, if you miss three or more appointments without notifying the office, you may be dismissed from the clinic at the provider's discretion.      For prescription refill requests, have your pharmacy contact our office and allow 72 hours for refills to be completed.    Today you received the following chemotherapy and/or immunotherapy agents: bevacizumab-awwb and irinotecan      To help prevent nausea and vomiting after your treatment, we encourage you to take your nausea medication as directed.  BELOW ARE SYMPTOMS THAT SHOULD BE REPORTED IMMEDIATELY: *FEVER GREATER THAN 100.4 F (38 C) OR HIGHER *CHILLS OR SWEATING *NAUSEA AND VOMITING THAT IS NOT CONTROLLED WITH YOUR NAUSEA MEDICATION *UNUSUAL SHORTNESS OF BREATH *UNUSUAL BRUISING OR BLEEDING *URINARY PROBLEMS (pain or burning when urinating, or frequent urination) *BOWEL PROBLEMS (unusual diarrhea, constipation, pain near the anus) TENDERNESS IN MOUTH AND THROAT WITH OR WITHOUT PRESENCE OF ULCERS (sore throat, sores in mouth, or a toothache) UNUSUAL RASH, SWELLING OR PAIN  UNUSUAL VAGINAL DISCHARGE OR ITCHING   Items with * indicate a potential emergency and should be followed up as soon as possible or go to the Emergency Department if any problems should occur.  Please show the CHEMOTHERAPY ALERT CARD or IMMUNOTHERAPY  ALERT CARD at check-in to the Emergency Department and triage nurse.  Should you have questions after your visit or need to cancel or reschedule your appointment, please contact [facility]  Dept: [phone]  and follow the prompts.  Office hours are 8:00 a.m. to 4:30 p.m. Monday - Friday. Please note that voicemails left after 4:00 p.m. may not be returned until the following business day.  We are closed weekends and major holidays. You have access to a nurse at all times for urgent questions. Please call the main number to the clinic Dept: [phone] and follow the prompts.   For any non-urgent questions, you may also contact your provider using MyChart. We now offer e-Visits for anyone 48 and older to request care online for non-urgent symptoms. For details visit [email or web address].   Also download the MyChart app! Go to the app store, search "MyChart", open the app, select [facility], and log in with your MyChart username and password.  Masks are optional in the cancer centers. If you would like for your care team to wear a mask while they are taking care of you, please let them know. You may have one support person who is at least 54 years old accompany you for your appointments.

## 2022-04-04 NOTE — Progress Notes (Signed)
[facility] at [facility] [place], [place] [address] [phone]   Interval Evaluation  Date of Service: 04/04/22 Patient Name: [name] Patient MRN: [id number] Patient DOB: 06-07-1965 Provider: Ventura Sellers, MD  Identifying Statement:  [name] is a 56 y.o. female with left temporal glioblastoma   Oncologic History: Oncology History  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[facility])  11/21/2020 Initial Diagnosis   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[facility])   11/21/2020 Cancer Staging   Staging form: Brain and Spinal Cord, AJCC 8th Edition - Pathologic stage from 11/21/2020: WHO Grade IV - Signed by Ventura Sellers, MD on 11/30/2020 Histopathologic type: Glioblastoma Stage prefix: Initial diagnosis Histologic grading system: 4 grade system Extent of surgical resection: Subtotal resection Karnofsky performance status: Score 90 Seizures at presentation: Present Duration of symptoms before diagnosis: Short IDH1 mutation: Negative   12/27/2020 - 02/07/2021 Radiation Therapy   IMRT and concurrent Temodar Isidore Moos)   03/12/2021 - 08/26/2021 Chemotherapy   Completes 5 cycles of adjuvant 5-day Temodar   08/27/2021 Progression   POD #1   09/06/2021 -  Chemotherapy   Initiates second line therapy; CCNU 37m/m2 PO q6 weeks and avastin 167mkg IV q2 weeks   12/28/2021 Progression   POD #2   01/17/2022 -  Chemotherapy   Third line therapy with Irinotecan and Avastin q2 weeks     Biomarkers:  MGMT Methylated.  IDH 1/2 Wild type.  EGFR Unknown  TERT Unknown   Interval History: [name] ZALENSKIresents today for chemotherapy.  No clinical changes today, and no other new or progressive complaints, no further seizures.  Short term memory impairment and fatigue are still persistent.       H+P (11/25/20) Patient presented to medical attention this past month with first ever seizure.  She  describes episode of sudden onset "disconnection" with tongue biting and urination, followed by period of confusion.  CNS imaging demonstrated enhancing mass within left anterior temporal lobe, which was mostly resected by Dr. [name] 11/21/20.  Since surgery she had not had recurrence of seizures.  She complains of painful cramping of her right leg, which started ~1 week prior.  At this time the cramping is her main complaint.  Otherwise fully functional, independent, would like to return to work if possible.  Medications: Current Outpatient Medications on File Prior to Visit  Medication Sig Dispense Refill   amLODipine (NORVASC) 10 MG tablet TAKE 1 TABLET BY MOUTH EVERY DAY 90 tablet 1   gabapentin (NEURONTIN) 100 MG capsule Take 1 capsule (100 mg total) by mouth 2 (two) times daily. 60 capsule 3   lacosamide (VIMPAT) 50 MG TABS tablet Take 2 tablets (100 mg total) by mouth every morning AND 1 tablet (50 mg total) at bedtime. 90 tablet 3   levETIRAcetam (KEPPRA) 750 MG tablet TAKE 2 TABLETS (1,500 MG TOTAL) BY MOUTH 2 (TWO) TIMES DAILY. 360 tablet 1   levothyroxine (SYNTHROID) 100 MCG tablet Take 1 tablet (100 mcg total) by mouth daily. 90 tablet 3   Potassium Chloride ER 20 MEQ TBCR TAKE 1 TABLET BY MOUTH EVERY DAY 90 tablet 3   amitriptyline (ELAVIL) 50 MG tablet TAKE 1 TABLET BY MOUTH EVERYDAY AT BEDTIME (Patient not taking: Reported on 04/04/2022) 90 tablet 2   LORazepam (ATIVAN) 2 MG tablet Take 1 tablet (2 mg total) by mouth every 8 (eight) hours as needed for seizure. (Patient not taking: Reported on 01/31/2022) 12 tablet  0   ondansetron (ZOFRAN-ODT) 4 MG disintegrating tablet Take 1 tablet (4 mg total) by mouth every 8 (eight) hours as needed for nausea or vomiting. (Patient not taking: Reported on 01/31/2022) 10 tablet 0   prochlorperazine (COMPAZINE) 10 MG tablet Take 10 mg by mouth every 6 (six) hours as needed for nausea or vomiting. (Patient not taking: Reported on 04/04/2022)     No  current facility-administered medications on file prior to visit.    Allergies: No Known Allergies Past Medical History:  Past Medical History:  Diagnosis Date   Bell's palsy    left side of face- notied in smile and eyes, if very tired   Brain cancer ([facility])    Complication of anesthesia    N/V   Essential hypertension    External hemorrhoids    History of blood transfusion    post op Hemorroids   History of pneumonia    Hyperlipidemia    Hyperthyroidism    hx 56 years old- oral meds   Iron deficiency anemia    Migraine headache    Pneumonia    x 2 - years ago   PONV (postoperative nausea and vomiting)    PPD positive    Pulmonary nodules    not seen on plain cxr, first detected 1/07 re ct 10/08 pos IPPD > 15 mm 12/09 FOB/lavage 04/20/08 neg afb smear   PVC's (premature ventricular contractions)    Seizure ([facility]) 11/15/2020   Sleep apnea    refused CPAP- mouth piece recommended   Tuberculosis    in her late 30's   Past Surgical History:  Past Surgical History:  Procedure Laterality Date   ABDOMINAL HYSTERECTOMY     APPLICATION OF CRANIAL NAVIGATION N/A 11/21/2020   Procedure: APPLICATION OF CRANIAL NAVIGATION;  Surgeon: Judith Part, MD;  Location: [facility];  Service: Neurosurgery;  Laterality: N/A;   BREAST BIOPSY Right 2014   BREAST BIOPSY Left 10/29/2019   CHOLECYSTECTOMY  2014   COLONOSCOPY  05/04/2009   prolapsing external hemorrhoids   CRANIOTOMY Left 11/21/2020   Procedure: Left craniotomy for tumor resection;  Surgeon: Judith Part, MD;  Location: [facility];  Service: Neurosurgery;  Laterality: Left;   ENDOMETRIAL ABLATION     HEMORRHOID SURGERY     x 3   TUBAL LIGATION     Social History:  Social History   Socioeconomic History   Marital status: Divorced    Spouse name: Not on file   Number of children: 2   Years of education: Not on file   Highest education level: Associate degree: occupational, Hotel manager, or vocational program   Occupational History   Occupation: accounts Sport and exercise psychologist: [employer]  Tobacco Use   Smoking status: Never   Smokeless tobacco: Never  Vaping Use   Vaping Use: Never used  Substance and Sexual Activity   Alcohol use: No    Alcohol/week: 0.0 standard drinks of alcohol   Drug use: No   Sexual activity: Not on file  Other Topics Concern   Not on file  Social History Narrative   Divorced with 2 children   She works as a Librarian, 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Press photographer    Possible TB exposure 2008 (after nodules found)   From [place] lived there and [place]   Never owned birds      Social Determinants of Altha Strain: Medium Risk (06/13/2021)   Overall Financial Resource Strain (CARDIA)    Difficulty of Paying Living Expenses:  Somewhat hard  Food Insecurity: Food Insecurity Present (04/23/2021)   Hunger Vital Sign    Worried About Running Out of Food in the Last Year: Sometimes true    Ran Out of Food in the Last Year: Never true  Transportation Needs: No Transportation Needs (06/13/2021)   PRAPARE - Hydrologist (Medical): No    Lack of Transportation (Non-Medical): No  Physical Activity: Unknown (04/23/2021)   Exercise Vital Sign    Days of Exercise per Week: 0 days    Minutes of Exercise per Session: Not on file  Stress: No Stress Concern Present (04/23/2021)   [place]    Feeling of Stress : Only a little  Social Connections: Moderately Integrated (04/23/2021)   Social Connection and Isolation Panel [NHANES]    Frequency of Communication with Friends and Family: Twice a week    Frequency of Social Gatherings with Friends and Family: Twice a week    Attends Religious Services: More than 4 times per year    Active Member of [employer] or Organizations: Yes    Attends Music therapist: More than 4 times per year    Marital Status: Divorced  Human resources officer Violence: Not on  file   Family History:  Family History  Problem Relation Age of Onset   Diabetes type II Mother    Kidney failure Sister    Diabetes Mellitus II Sister    Colon cancer Maternal Aunt        aunts and uncles   Diabetes Mellitus II Maternal Aunt    Pancreatic cancer Maternal Aunt    Colon cancer Maternal Uncle    Diabetes Mellitus II Maternal Uncle    Heart disease Maternal Grandmother        aunts, uncles, sister   Diabetes Mellitus II Maternal Grandmother    Lung cancer Other        aunts and uncles    Review of Systems: Constitutional: poor appetite Eyes: blurriness of vision Ears, nose, mouth, throat, and face: Doesn't report sore throat Respiratory: Doesn't report cough, dyspnea or wheezes Cardiovascular: Doesn't report palpitation, chest discomfort  Gastrointestinal:  Doesn't report nausea, constipation, diarrhea GU: Doesn't report incontinence Skin: Doesn't report skin rashes Neurological: Per HPI Musculoskeletal: bilateral shoulder pain Behavioral/Psych: +anxiety  Physical Exam: Vitals:   04/04/22 1025  BP: (!) 149/100  Pulse: (!) 103  Resp: 17  Temp: 99.3 F (37.4 C)  SpO2: 100%    KPS: 70. General: Alert, cooperative, pleasant, in no acute distress Head: Normal EENT: No conjunctival injection or scleral icterus.  Lungs: Resp effort normal Cardiac: Regular rate Abdomen: Non-distended abdomen Skin: No rashes cyanosis or petechiae. Extremities: No clubbing or edema  Neurologic Exam: Mental Status: Awake, alert, attentive to examiner. Oriented to self and environment. Language is notable for modest impairment in fluency.  Impaired short term recall. Cranial Nerves: Visual acuity is grossly normal. Visual fields are full. Extra-ocular movements intact. No ptosis. Face L LMN paresis, chronic. Motor: Tone and bulk are normal. Power is full in both arms and legs. Reflexes are symmetric, no pathologic reflexes present.  Sensory: Intact to light touch Gait:  Normal.   Labs: I have reviewed the data as listed    Component Value Date/Time   NA 139 03/21/2022 0911   K 3.6 03/21/2022 0911   CL 105 03/21/2022 0911   CO2 27 03/21/2022 0911   GLUCOSE 106 (H) 03/21/2022 0911  BUN 12 03/21/2022 0911   CREATININE 0.81 03/21/2022 0911   CREATININE 0.92 01/25/2020 1424   CALCIUM 9.6 03/21/2022 0911   PROT 7.0 03/21/2022 0911   ALBUMIN 4.2 03/21/2022 0911   AST 11 (L) 03/21/2022 0911   ALT 16 03/21/2022 0911   ALKPHOS 111 03/21/2022 0911   BILITOT 0.6 03/21/2022 0911   GFRNONAA >60 03/21/2022 0911   GFRNONAA 72 01/25/2020 1424   GFRAA 83 01/25/2020 1424   Lab Results  Component Value Date   WBC 4.8 04/04/2022   NEUTROABS 3.7 04/04/2022   HGB 11.8 (L) 04/04/2022   HCT 37.5 04/04/2022   MCV 101.4 (H) 04/04/2022   PLT 173 04/04/2022   Imaging:  [facility] Clinician Interpretation: I have personally reviewed the CNS images as listed.  My interpretation, in the context of the patient's clinical presentation, is stable disease  MR BRAIN W WO CONTRAST  Result Date: 03/16/2022 CLINICAL DATA:  Provided history: Glioblastoma with isocitrate dehydrogenase gene wild type. Brain/CNS neoplasm, assess treatment response. EXAM: MRI HEAD WITHOUT AND WITH CONTRAST TECHNIQUE: Multiplanar, multiecho pulse sequences of the brain and surrounding structures were obtained without and with intravenous contrast. CONTRAST:  78m GADAVIST GADOBUTROL 1 MMOL/ML IV SOLN COMPARISON:  Prior brain MRI examinations 12/28/2021 and earlier. Head CT 12/28/2021 FINDINGS: Brain: No age advanced or lobar predominant parenchymal atrophy. Redemonstrated postoperative changes from partial resection of the component of the mass in the anterior left temporal lobe. A focus of restricted diffusion along the medial aspect of the left lateral ventricle atrium has increased in size, now measuring 9 mm (previously measuring 6 mm) (series 5, image 22). Additionally, there are multiple new  subcentimeter foci of restricted diffusion within the left parietal and periatrial white matter (for instance as seen on series 5, images 23-28). There is precontrast T1 hyperintensity, and probable faint enhancement, associated with some of these lesions. Foci of restricted diffusion elsewhere within the left parietal white matter, within the medial left temporal lobe, within the left retrolenticular white matter and along the frontal horn of the left lateral ventricle have not significantly changed. Progressive irregular precontrast T1 hyperintense signal abnormality within the medial left temporal lobe and left retrolenticular white matter, compatible with mineralization and possible non-acute hemorrhage. Similar foci signal abnormality along the left lateral ventricle frontal horn, unchanged. As before, there are superimposed foci of faint enhancement at these sites. T2 FLAIR hyperintense signal abnormality has subtly progressed within the anterior left frontal white matter (series 9, image 26). Extensive T2 FLAIR hyperintense signal abnormality elsewhere within the cerebral hemisphere has not significantly changed. There are a few punctate chronic microhemorrhages scattered within the supratentorial brain. No progressive mass effect. No midline shift. No extra-axial fluid collection. Vascular: Maintained flow voids within the proximal large arterial vessels. Skull and upper cervical spine: No focal suspicious marrow lesion. Prior left-sided craniotomy and craniectomy. Sinuses/Orbits: Bilateral proptosis. 17 mm mucous retention cyst, and background mild mucosal thickening, within the right maxillary sinus. 7 mm mucous retention cyst within the left maxillary sinus. Mild mucosal thickening within the bilateral ethmoid and right frontal sinuses. IMPRESSION: 1. New and enlarging foci of restricted diffusion (with probable faint enhancement) in the left parietal and periatrial white matter, as described and  concerning for tumor progression. 2. Subtle progression of T2 FLAIR hyperintense signal abnormality within the anterior left frontal white matter without new mass effect. This is favored to reflect progressive post-treatment changes. However, attention is recommended on imaging follow-up. 3. Progressive irregular foci of pre-contrast T1  hyperintense signal abnormality within portions of the mass in the medial left temporal lobe and left retrolenticular white matter. Findings are compatible with progressive mineralization, and possible non-acute hemorrhage. 4. Otherwise stable appearance of the left cerebral mass. 5. Paranasal sinus disease, as described. Electronically Signed   By: Kellie Simmering D.O.   On: 03/16/2022 16:15   CT ABD PELVIS W/WO CM ONCOLOGY PANCREATIC PROTOCOL  Result Date: 03/07/2022 CLINICAL DATA:  Follow-up pancreatic cancer.  * Tracking Code: BO * EXAM: CT ABDOMEN AND PELVIS WITHOUT AND WITH CONTRAST TECHNIQUE: Multidetector CT imaging of the abdomen and pelvis was performed following the standard protocol before and following the bolus administration of intravenous contrast. RADIATION DOSE REDUCTION: This exam was performed according to the departmental dose-optimization program which includes automated exposure control, adjustment of the mA and/or kV according to patient size and/or use of iterative reconstruction technique. CONTRAST:  175m OMNIPAQUE IOHEXOL 300 MG/ML  SOLN COMPARISON:  Multiple prior imaging studies. The most recent is 02/16/2022 02/16/2022 FINDINGS: Lower chest: The lung bases are clear of acute process. No pleural effusion or pulmonary lesions. The heart is normal in size. No pericardial effusion. The distal esophagus and aorta are unremarkable. Hepatobiliary: No hepatic lesions or intrahepatic biliary dilatation. The gallbladder is surgically absent. No common bile duct dilatation. Pancreas: There is a stable smoothly marginated and well circumscribed lobular lesion  projecting off the anterior aspect of the lower pancreatic head. This measures a maximum a 17 mm. This lesion has been present since 2014 and is considered benign. I believe this is most likely a normal variant of pancreatic anatomy. Spleen: Normal size.  No focal lesions. Adrenals/Urinary Tract: The adrenal glands and kidneys are normal. Stomach/Bowel: The stomach, duodenum, visualized small bowel and visualized colon unremarkable. Vascular/Lymphatic: The aorta and branch vessels are stable. Moderate age advanced atherosclerotic calcifications involving the distal aorta. No mesenteric or retroperitoneal adenopathy. Reproductive: The uterus is surgically absent. I believe the right ovary is still present to is unremarkable. Other: No pelvic mass or adenopathy. No free pelvic fluid collections. No inguinal mass or adenopathy. No abdominal wall hernia or subcutaneous lesions. Musculoskeletal: No significant bony findings. IMPRESSION: 1. Stable 17 mm smoothly marginated and well circumscribed lobular lesion projecting off the anterior aspect of the lower pancreatic head. This has been present since 2014 and is considered benign. 2. No abdominal/pelvic lymphadenopathy. 3. Status post cholecystectomy. No biliary dilatation. 4. Age advanced atherosclerotic calcifications involving the distal aorta. Aortic Atherosclerosis (ICD10-I70.0). Electronically Signed   By: PMarijo SanesM.D.   On: 03/07/2022 09:11     Assessment/Plan 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[facility] [C71.9]  Lillyona MNA WALDRIPis clinically stable today.    Will recommend to administer both irinotecan and avastin today.  Zarxio will be given for bone marrow support tomorrow.  Chemotherapy should be held for the following:  ANC less than 1,000  Platelets less than 100,000  LFT or creatinine greater than 2x ULN  If clinical concerns/contraindications develop  Avastin should be held for the following:  ANC less than  500  Platelets less than 50,000  LFT or creatinine greater than 2x ULN  If clinical concerns/contraindications develop  For seizures, will con't Vimpat 100/50 and con't Keppra 15051mBID.    She will give her PCP a call regarding her blood pressure, will con't norvasc 1060maily for now.  Ok with trial of gabapentin 100m26mD for neuropathic head/neck pain.  Should continue to dose 20me75mtassium daily.  We  ask that ADONNA HORSLEY return to clinic in 2 weeks wprior to next scheduled infusion of CPT-11, Avastin and labs.  Zarxio will be given again tomorrow.    All questions were answered. The patient knows to call the clinic with any problems, questions or concerns. No barriers to learning were detected.  The total time spent in the encounter was 40 minutes and more than 50% was on counseling and review of test results   Ventura Sellers, MD Medical Director of Neuro-Oncology [facility] 04/04/22 10:33 AM

## 2022-04-05 ENCOUNTER — Inpatient Hospital Stay: Payer: BC Managed Care – PPO | Attending: Internal Medicine

## 2022-04-05 VITALS — BP 134/92 | Temp 98.0°F | Resp 18

## 2022-04-05 DIAGNOSIS — Z5111 Encounter for antineoplastic chemotherapy: Secondary | ICD-10-CM | POA: Insufficient documentation

## 2022-04-05 DIAGNOSIS — D701 Agranulocytosis secondary to cancer chemotherapy: Secondary | ICD-10-CM

## 2022-04-05 DIAGNOSIS — Z5189 Encounter for other specified aftercare: Secondary | ICD-10-CM | POA: Insufficient documentation

## 2022-04-05 DIAGNOSIS — C712 Malignant neoplasm of temporal lobe: Secondary | ICD-10-CM | POA: Insufficient documentation

## 2022-04-05 DIAGNOSIS — R569 Unspecified convulsions: Secondary | ICD-10-CM | POA: Insufficient documentation

## 2022-04-05 MED ORDER — PEGFILGRASTIM-CBQV 6 MG/0.6ML ~~LOC~~ SOSY: 6.0000 mg | PREFILLED_SYRINGE | Freq: Once | SUBCUTANEOUS | Status: DC

## 2022-04-05 MED ORDER — FILGRASTIM-SNDZ 300 MCG/0.5ML IJ SOSY
300.0000 ug | PREFILLED_SYRINGE | Freq: Once | INTRAMUSCULAR | Status: AC
  Administered 2022-04-05: 300 ug via SUBCUTANEOUS
  Filled 2022-04-05: qty 0.5

## 2022-04-07 ENCOUNTER — Emergency Department (HOSPITAL_COMMUNITY): Payer: BC Managed Care – PPO

## 2022-04-07 ENCOUNTER — Other Ambulatory Visit: Payer: Self-pay

## 2022-04-07 ENCOUNTER — Emergency Department (HOSPITAL_COMMUNITY)
Admission: EM | Admit: 2022-04-07 | Discharge: 2022-04-07 | Disposition: A | Discharge status: Home / Self Care | Payer: BC Managed Care – PPO | Attending: Emergency Medicine | Admitting: Emergency Medicine

## 2022-04-07 ENCOUNTER — Encounter (HOSPITAL_COMMUNITY): Payer: Self-pay

## 2022-04-07 DIAGNOSIS — R569 Unspecified convulsions: Secondary | ICD-10-CM | POA: Diagnosis not present

## 2022-04-07 DIAGNOSIS — I959 Hypotension, unspecified: Secondary | ICD-10-CM | POA: Diagnosis not present

## 2022-04-07 DIAGNOSIS — R519 Headache, unspecified: Secondary | ICD-10-CM | POA: Diagnosis not present

## 2022-04-07 DIAGNOSIS — R0689 Other abnormalities of breathing: Secondary | ICD-10-CM | POA: Diagnosis not present

## 2022-04-07 LAB — CBC WITH DIFFERENTIAL/PLATELET
Abs Immature Granulocytes: 0.07 10*3/uL (ref 0.00–0.07)
Basophils Absolute: 0 10*3/uL (ref 0.0–0.1)
Basophils Relative: 0 %
Eosinophils Absolute: 0 10*3/uL (ref 0.0–0.5)
Eosinophils Relative: 0 %
HCT: 34.1 % — ABNORMAL LOW (ref 36.0–46.0)
Hemoglobin: 10.5 g/dL — ABNORMAL LOW (ref 12.0–15.0)
Immature Granulocytes: 1 %
Lymphocytes Relative: 10 %
Lymphs Abs: 0.7 10*3/uL (ref 0.7–4.0)
MCH: 32.2 pg (ref 26.0–34.0)
MCHC: 30.8 g/dL (ref 30.0–36.0)
MCV: 104.6 fL — ABNORMAL HIGH (ref 80.0–100.0)
Monocytes Absolute: 0.1 10*3/uL (ref 0.1–1.0)
Monocytes Relative: 1 %
Neutro Abs: 6.2 10*3/uL (ref 1.7–7.7)
Neutrophils Relative %: 88 %
Platelets: 163 10*3/uL (ref 150–400)
RBC: 3.26 MIL/uL — ABNORMAL LOW (ref 3.87–5.11)
RDW: 15.4 % (ref 11.5–15.5)
WBC: 7.1 10*3/uL (ref 4.0–10.5)
nRBC: 0 % (ref 0.0–0.2)

## 2022-04-07 LAB — COMPREHENSIVE METABOLIC PANEL
ALT: 30 U/L (ref 0–44)
AST: 17 U/L (ref 15–41)
Albumin: 3.3 g/dL — ABNORMAL LOW (ref 3.5–5.0)
Alkaline Phosphatase: 86 U/L (ref 38–126)
Anion gap: 7 (ref 5–15)
BUN: 15 mg/dL (ref 6–20)
CO2: 21 mmol/L — ABNORMAL LOW (ref 22–32)
Calcium: 8.4 mg/dL — ABNORMAL LOW (ref 8.9–10.3)
Chloride: 114 mmol/L — ABNORMAL HIGH (ref 98–111)
Creatinine, Ser: 0.86 mg/dL (ref 0.44–1.00)
GFR, Estimated: >60 mL/min (ref >60)
Glucose, Bld: 76 mg/dL (ref 70–99)
Potassium: 3.2 mmol/L — ABNORMAL LOW (ref 3.5–5.1)
Sodium: 142 mmol/L (ref 135–145)
Total Bilirubin: 1.5 mg/dL — ABNORMAL HIGH (ref 0.3–1.2)
Total Protein: 6 g/dL — ABNORMAL LOW (ref 6.5–8.1)

## 2022-04-07 MED ORDER — LEVETIRACETAM IN NACL 1000 MG/100ML IV SOLN
1000.0000 mg | Freq: Once | INTRAVENOUS | Status: AC
  Administered 2022-04-07: 1000 mg via INTRAVENOUS
  Filled 2022-04-07 (×2): qty 100

## 2022-04-07 NOTE — ED Triage Notes (Signed)
Patient coming from [facility] via EMS with c/o seizure. Per pt's cousin, seizure lasted about 5 minutes. Patient had an episode of incontinence. No fall, no trauma noted. Hx seizures, hx brain cancer. Patient currently postictal, A&Ox1.

## 2022-04-07 NOTE — Discharge Instructions (Signed)
Increase your seizure medicine Vimpat so you are taking 100 mg twice a day which is equivalent to 2  pills twice a day.  Contact your neurologist office tomorrow and let them know that she had a seizure and that we increased her medicine.  They may want to see you soon

## 2022-04-07 NOTE — ED Provider Notes (Signed)
[facility] DEPT Provider Note   CSN: [id number] Arrival date & time: 04/07/22  1541     History {Add pertinent medical, surgical, social history, OB history to HPI:1} Chief Complaint  Patient presents with   Seizures    [name] is a 56 y.o. female.  Patient with a history of seizures with prior craniotomy for glioblastoma.  Patient had a seizure today.  The last time she had a seizure like this was 6 months ago   Seizures      Home Medications Prior to Admission medications   Medication Sig Start Date End Date Taking? Authorizing Provider  amLODipine (NORVASC) 10 MG tablet TAKE 1 TABLET BY MOUTH EVERY DAY 12/30/21   Vaslow, Acey Lav, MD  gabapentin (NEURONTIN) 100 MG capsule Take 1 capsule (100 mg total) by mouth 2 (two) times daily. 03/21/22   Ventura Sellers, MD  lacosamide (VIMPAT) 50 MG TABS tablet Take 2 tablets (100 mg total) by mouth every morning AND 1 tablet (50 mg total) at bedtime. 12/25/21   Ventura Sellers, MD  levETIRAcetam (KEPPRA) 750 MG tablet TAKE 2 TABLETS (1,500 MG TOTAL) BY MOUTH 2 (TWO) TIMES DAILY. 03/25/22   Ventura Sellers, MD  levothyroxine (SYNTHROID) 100 MCG tablet Take 1 tablet (100 mcg total) by mouth daily. 02/01/22   Nafziger, [name], NP  LORazepam (ATIVAN) 2 MG tablet Take 1 tablet (2 mg total) by mouth every 8 (eight) hours as needed for seizure. Patient not taking: Reported on 01/31/2022 12/25/21   Ventura Sellers, MD  ondansetron (ZOFRAN-ODT) 4 MG disintegrating tablet Take 1 tablet (4 mg total) by mouth every 8 (eight) hours as needed for nausea or vomiting. Patient not taking: Reported on 01/31/2022 12/17/21   Barrie Folk, PA-C  Potassium Chloride ER 20 MEQ TBCR TAKE 1 TABLET BY MOUTH EVERY DAY 07/13/21   Nafziger, [name], NP  prochlorperazine (COMPAZINE) 10 MG tablet Take 10 mg by mouth every 6 (six) hours as needed for nausea or vomiting. Patient not taking: Reported on 04/04/2022     [provider]      Allergies    Patient has no known allergies.    Review of Systems   Review of Systems  Neurological:  Positive for seizures.    Physical Exam Updated Vital Signs BP 118/86   Pulse 85   Temp 98.1 F (36.7 C) (Oral)   Resp 16   Ht '5\' 7"'$  (1.702 m)   SpO2 97%   BMI 25.70 kg/m  Physical Exam  ED Results / Procedures / Treatments   Labs (all labs ordered are listed, but only abnormal results are displayed) Labs Reviewed  CBC WITH DIFFERENTIAL/PLATELET - Abnormal; Notable for the following components:      Result Value   RBC 3.26 (*)    Hemoglobin 10.5 (*)    HCT 34.1 (*)    MCV 104.6 (*)    All other components within normal limits  COMPREHENSIVE METABOLIC PANEL - Abnormal; Notable for the following components:   Potassium 3.2 (*)    Chloride 114 (*)    CO2 21 (*)    Calcium 8.4 (*)    Total Protein 6.0 (*)    Albumin 3.3 (*)    Total Bilirubin 1.5 (*)    All other components within normal limits    EKG None  Radiology CT Head Wo Contrast  Result Date: 04/07/2022 CLINICAL DATA:  Headache and seizure.  History of glioblastoma. EXAM: CT HEAD  WITHOUT CONTRAST TECHNIQUE: Contiguous axial images were obtained from the base of the skull through the vertex without intravenous contrast. RADIATION DOSE REDUCTION: This exam was performed according to the departmental dose-optimization program which includes automated exposure control, adjustment of the mA and/or kV according to patient size and/or use of iterative reconstruction technique. COMPARISON:  CT head 02/16/2022 FINDINGS: Brain: Left frontotemporal craniotomy is again noted. There is encephalomalacia in the left middle cranial fossa. Hypodensity throughout the left cerebral hemisphere is unchanged. Punctate densities in the periventricular white matter of the left parietal and temporal lobes are unchanged. Calcifications in the inferior left frontal lobe are unchanged. There is no evidence  for acute intracranial hemorrhage, mass effect or midline shift. Basal cisterns are patent. Vascular: No hyperdense vessel or unexpected calcification. Skull: Normal. Negative for fracture or focal lesion. Sinuses/Orbits: There is a small polyp or mucous retention cyst in the right maxillary sinus measuring 14 mm. Other: None. IMPRESSION: 1. No acute intracranial process. 2. Stable postsurgical changes and encephalomalacia in the left cerebral hemisphere. Electronically Signed   By: Ronney Asters M.D.   On: 04/07/2022 17:13    Procedures Procedures  {Document cardiac monitor, telemetry assessment procedure when appropriate:1}  Medications Ordered in ED Medications  levETIRAcetam (KEPPRA) IVPB 1000 mg/100 mL premix (1,000 mg Intravenous New Bag/Given 04/07/22 1645)    ED Course/ Medical Decision Making/ A&P                           Medical Decision Making Amount and/or Complexity of Data Reviewed Labs: ordered. Radiology: ordered.  Risk Prescription drug management.   Patient with breakthrough seizure.  We will increase her Vimpat so she is taking 100 mg twice a day.  She was taken 100 the morning and 50 in the evening before.  Patient will get in touch with her neurologist tomorrow  {Document critical care time when appropriate:1} {Document review of labs and clinical decision tools ie heart score, Chads2Vasc2 etc:1}  {Document your independent review of radiology images, and any outside records:1} {Document your discussion with family members, caretakers, and with consultants:1} {Document social determinants of health affecting pt's care:1} {Document your decision making why or why not admission, treatments were needed:1} Final Clinical Impression(s) / ED Diagnoses Final diagnoses:  Seizure ([facility])    Rx / DC Orders ED Discharge Orders     None

## 2022-04-08 ENCOUNTER — Inpatient Hospital Stay (HOSPITAL_BASED_OUTPATIENT_CLINIC_OR_DEPARTMENT_OTHER): Payer: BC Managed Care – PPO | Admitting: Internal Medicine

## 2022-04-08 DIAGNOSIS — C719 Malignant neoplasm of brain, unspecified: Secondary | ICD-10-CM

## 2022-04-08 MED ORDER — DEXAMETHASONE 2 MG PO TABS: 2.0000 mg | ORAL_TABLET | Freq: Every day | ORAL | 1 refills | Status: DC

## 2022-04-08 NOTE — Progress Notes (Signed)
I connected with [name] on 04/08/22 at  3:00 PM EST by telephone visit and verified that I am speaking with the correct person using two identifiers.  I discussed the limitations, risks, security and privacy concerns of performing an evaluation and management service by telemedicine and the availability of in-person appointments. I also discussed with the patient that there may be a patient responsible charge related to this service. The patient expressed understanding and agreed to proceed.  Other persons participating in the visit and their role in the encounter:  n/a  Patient's location:  Home Provider's location:  Office Chief Complaint:  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[facility])  History of Present Ilness: [name] experienced a seizure yesterday, prompting ED visit.  Team there increased vimpat to '100mg'$  twice per day, in addition to standing dose of Keppra '1500mg'$  twice per day.  This morning she had an aura, took ativan '2mg'$  which abated the symptoms.  Currently she feels tired, sleepy, no new symptoms.    Observations: Language and cognition at baseline, increased somnolence  Imaging:  CT Head Wo Contrast  Result Date: 04/07/2022 CLINICAL DATA:  Headache and seizure.  History of glioblastoma. EXAM: CT HEAD WITHOUT CONTRAST TECHNIQUE: Contiguous axial images were obtained from the base of the skull through the vertex without intravenous contrast. RADIATION DOSE REDUCTION: This exam was performed according to the departmental dose-optimization program which includes automated exposure control, adjustment of the mA and/or kV according to patient size and/or use of iterative reconstruction technique. COMPARISON:  CT head 02/16/2022 FINDINGS: Brain: Left frontotemporal craniotomy is again noted. There is encephalomalacia in the left middle cranial fossa. Hypodensity throughout the left cerebral hemisphere is unchanged. Punctate densities in the periventricular  white matter of the left parietal and temporal lobes are unchanged. Calcifications in the inferior left frontal lobe are unchanged. There is no evidence for acute intracranial hemorrhage, mass effect or midline shift. Basal cisterns are patent. Vascular: No hyperdense vessel or unexpected calcification. Skull: Normal. Negative for fracture or focal lesion. Sinuses/Orbits: There is a small polyp or mucous retention cyst in the right maxillary sinus measuring 14 mm. Other: None. IMPRESSION: 1. No acute intracranial process. 2. Stable postsurgical changes and encephalomalacia in the left cerebral hemisphere. Electronically Signed   By: Ronney Asters M.D.   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 Brain/CNS neoplasm, assess treatment response. EXAM: MRI HEAD WITHOUT AND WITH CONTRAST TECHNIQUE: Multiplanar, multiecho pulse sequences of the brain and surrounding structures were obtained without and with intravenous contrast. CONTRAST:  32m GADAVIST GADOBUTROL 1 MMOL/ML IV SOLN COMPARISON:  Prior brain MRI examinations 12/28/2021 and earlier. Head CT 12/28/2021 FINDINGS: Brain: No age advanced or lobar predominant parenchymal atrophy. Redemonstrated postoperative changes from partial resection of the component of the mass in the anterior left temporal lobe. A focus of restricted diffusion along the medial aspect of the left lateral ventricle atrium has increased in size, now measuring 9 mm (previously measuring 6 mm) (series 5, image 22). Additionally, there are multiple new subcentimeter foci of restricted diffusion within the left parietal and periatrial white matter (for instance as seen on series 5, images 23-28). There is precontrast T1 hyperintensity, and probable faint enhancement, associated with some of these lesions. Foci of restricted diffusion elsewhere within the left parietal white  matter, within the medial left temporal lobe, within the left retrolenticular white matter  and along the frontal horn of the left lateral ventricle have not significantly changed. Progressive irregular precontrast T1 hyperintense signal abnormality within the medial left temporal lobe and left retrolenticular white matter, compatible with mineralization and possible non-acute hemorrhage. Similar foci signal abnormality along the left lateral ventricle frontal horn, unchanged. As before, there are superimposed foci of faint enhancement at these sites. T2 FLAIR hyperintense signal abnormality has subtly progressed within the anterior left frontal white matter (series 9, image 26). Extensive T2 FLAIR hyperintense signal abnormality elsewhere within the cerebral hemisphere has not significantly changed. There are a few punctate chronic microhemorrhages scattered within the supratentorial brain. No progressive mass effect. No midline shift. No extra-axial fluid collection. Vascular: Maintained flow voids within the proximal large arterial vessels. Skull and upper cervical spine: No focal suspicious marrow lesion. Prior left-sided craniotomy and craniectomy. Sinuses/Orbits: Bilateral proptosis. 17 mm mucous retention cyst, and background mild mucosal thickening, within the right maxillary sinus. 7 mm mucous retention cyst within the left maxillary sinus. Mild mucosal thickening within the bilateral ethmoid and right frontal sinuses. IMPRESSION: 1. New and enlarging foci of restricted diffusion (with probable faint enhancement) in the left parietal and periatrial white matter, as described and concerning for tumor progression. 2. Subtle progression of T2 FLAIR hyperintense signal abnormality within the anterior left frontal white matter without new mass effect. This is favored to reflect progressive post-treatment changes. However, attention is recommended on imaging follow-up. 3. Progressive irregular foci of pre-contrast  T1 hyperintense signal abnormality within portions of the mass in the medial left temporal lobe and left retrolenticular white matter. Findings are compatible with progressive mineralization, and possible non-acute hemorrhage. 4. Otherwise stable appearance of the left cerebral mass. 5. Paranasal sinus disease, as described. Electronically Signed   By: Kellie Simmering D.O.   On: 03/16/2022 16:15     Assessment and Plan: Glioblastoma with isocitrate dehydrogenase gene wildtype [facility])  [name] presents with breakthrough focal seizure, plus an additional focal aura.    Agree with increasing Vimpat to '100mg'$  BID in addition to Keppra '1500mg'$  BID.  Also will re-introduce dexamethasone, '6mg'$  NOW dose followed by '2mg'$  daily starting tomorrow.  She will follow up in 2 weeks as scheduled, or sooner if needed.  Ok with PRN rescue ativan as discussed.  Follow Up Instructions:  RTC in 2 weeks  I discussed the assessment and treatment plan with the patient.  The patient was provided an opportunity to ask questions and all were answered.  The patient agreed with the plan and demonstrated understanding of the instructions.    The patient was advised to call back or seek an in-person evaluation if the symptoms worsen or if the condition fails to improve as anticipated.    Ventura Sellers, MD   I provided 22 minutes of non face-to-face telephone visit time during this encounter, and > 50% was spent counseling as documented under my assessment & plan.

## 2022-04-09 ENCOUNTER — Ambulatory Visit: Payer: BC Managed Care – PPO | Admitting: Internal Medicine

## 2022-04-23 ENCOUNTER — Telehealth: Payer: Self-pay

## 2022-04-23 NOTE — Telephone Encounter (Signed)
Spoke with Representative Antonne with [facility] regarding Medical Request Form received for completion by Eppie Gibson MD. Informed Representative that Patient's treatment with radiation ended in 2022, and that she was currently treated through Medical Oncology. Representative stated that [facility] had received everything that was needed to continue current [facility] for Patient and that no further forms or information was needed at this time.

## 2022-04-25 ENCOUNTER — Inpatient Hospital Stay (HOSPITAL_BASED_OUTPATIENT_CLINIC_OR_DEPARTMENT_OTHER): Payer: BC Managed Care – PPO | Admitting: Internal Medicine

## 2022-04-25 ENCOUNTER — Inpatient Hospital Stay: Payer: BC Managed Care – PPO

## 2022-04-25 VITALS — BP 154/90 | HR 84 | Temp 97.9°F | Resp 17 | Ht 67.0 in | Wt 167.7 lb

## 2022-04-25 DIAGNOSIS — R569 Unspecified convulsions: Secondary | ICD-10-CM | POA: Diagnosis not present

## 2022-04-25 DIAGNOSIS — C719 Malignant neoplasm of brain, unspecified: Secondary | ICD-10-CM | POA: Diagnosis not present

## 2022-04-25 DIAGNOSIS — Z5111 Encounter for antineoplastic chemotherapy: Secondary | ICD-10-CM | POA: Diagnosis not present

## 2022-04-25 DIAGNOSIS — C712 Malignant neoplasm of temporal lobe: Secondary | ICD-10-CM | POA: Diagnosis not present

## 2022-04-25 DIAGNOSIS — Z5189 Encounter for other specified aftercare: Secondary | ICD-10-CM | POA: Diagnosis not present

## 2022-04-25 LAB — CMP (CANCER CENTER ONLY)
ALT: 14 U/L (ref 0–44)
AST: 11 U/L — ABNORMAL LOW (ref 15–41)
Albumin: 4.1 g/dL (ref 3.5–5.0)
Alkaline Phosphatase: 140 U/L — ABNORMAL HIGH (ref 38–126)
Anion gap: 8 (ref 5–15)
BUN: 17 mg/dL (ref 6–20)
CO2: 29 mmol/L (ref 22–32)
Calcium: 9.5 mg/dL (ref 8.9–10.3)
Chloride: 103 mmol/L (ref 98–111)
Creatinine: 0.82 mg/dL (ref 0.44–1.00)
GFR, Estimated: >60 mL/min (ref >60)
Glucose, Bld: 133 mg/dL — ABNORMAL HIGH (ref 70–99)
Potassium: 3.8 mmol/L (ref 3.5–5.1)
Sodium: 140 mmol/L (ref 135–145)
Total Bilirubin: 0.6 mg/dL (ref 0.3–1.2)
Total Protein: 6.8 g/dL (ref 6.5–8.1)

## 2022-04-25 LAB — CBC WITH DIFFERENTIAL (CANCER CENTER ONLY)
Abs Immature Granulocytes: 0.11 10*3/uL — ABNORMAL HIGH (ref 0.00–0.07)
Basophils Absolute: 0 10*3/uL (ref 0.0–0.1)
Basophils Relative: 0 %
Eosinophils Absolute: 0 10*3/uL (ref 0.0–0.5)
Eosinophils Relative: 0 %
HCT: 38.5 % (ref 36.0–46.0)
Hemoglobin: 12.5 g/dL (ref 12.0–15.0)
Immature Granulocytes: 2 %
Lymphocytes Relative: 20 %
Lymphs Abs: 1.3 10*3/uL (ref 0.7–4.0)
MCH: 31.7 pg (ref 26.0–34.0)
MCHC: 32.5 g/dL (ref 30.0–36.0)
MCV: 97.7 fL (ref 80.0–100.0)
Monocytes Absolute: 0.8 10*3/uL (ref 0.1–1.0)
Monocytes Relative: 12 %
Neutro Abs: 4 10*3/uL (ref 1.7–7.7)
Neutrophils Relative %: 66 %
Platelet Count: 210 10*3/uL (ref 150–400)
RBC: 3.94 MIL/uL (ref 3.87–5.11)
RDW: 16.3 % — ABNORMAL HIGH (ref 11.5–15.5)
WBC Count: 6.2 10*3/uL (ref 4.0–10.5)
nRBC: 1.3 % — ABNORMAL HIGH (ref 0.0–0.2)

## 2022-04-25 LAB — TOTAL PROTEIN, URINE DIPSTICK: Protein, ur: <30 mg/dL — AB

## 2022-04-25 MED ORDER — SODIUM CHLORIDE 0.9% FLUSH: 10.0000 mL | INTRAVENOUS | Status: DC | PRN

## 2022-04-25 MED ORDER — ATROPINE SULFATE 1 MG/ML IV SOLN
0.5000 mg | Freq: Once | INTRAVENOUS | Status: AC | PRN
  Administered 2022-04-25: 0.5 mg via INTRAVENOUS
  Filled 2022-04-25: qty 1

## 2022-04-25 MED ORDER — HEPARIN SOD (PORK) LOCK FLUSH 100 UNIT/ML IV SOLN: 500.0000 [IU] | Freq: Once | INTRAVENOUS | Status: DC | PRN

## 2022-04-25 MED ORDER — SODIUM CHLORIDE 0.9 % IV SOLN: Freq: Once | INTRAVENOUS | Status: AC

## 2022-04-25 MED ORDER — PALONOSETRON HCL INJECTION 0.25 MG/5ML
0.2500 mg | Freq: Once | INTRAVENOUS | Status: AC
  Administered 2022-04-25: 0.25 mg via INTRAVENOUS
  Filled 2022-04-25: qty 5

## 2022-04-25 MED ORDER — IRINOTECAN HCL CHEMO INJECTION 100 MG/5ML
125.0000 mg/m2 | Freq: Once | INTRAVENOUS | Status: AC
  Administered 2022-04-25: 240 mg via INTRAVENOUS
  Filled 2022-04-25: qty 12

## 2022-04-25 MED ORDER — SODIUM CHLORIDE 0.9 % IV SOLN
10.0000 mg/kg | Freq: Once | INTRAVENOUS | Status: AC
  Administered 2022-04-25: 700 mg via INTRAVENOUS
  Filled 2022-04-25: qty 16

## 2022-04-25 MED ORDER — SODIUM CHLORIDE 0.9 % IV SOLN
10.0000 mg | Freq: Once | INTRAVENOUS | Status: AC
  Administered 2022-04-25: 10 mg via INTRAVENOUS
  Filled 2022-04-25: qty 10

## 2022-04-25 MED ORDER — DEXAMETHASONE 1 MG PO TABS: 1.0000 mg | ORAL_TABLET | Freq: Every day | ORAL | 1 refills | Status: DC

## 2022-04-25 NOTE — Patient Instructions (Signed)
[facility]  Discharge Instructions: Thank you for choosing [facility] to provide your oncology and hematology care.   If you have a lab appointment with the [facility], please go directly to the [facility] and check in at the registration area.   Wear comfortable clothing and clothing appropriate for easy access to any Portacath or PICC line.   We strive to give you quality time with your provider. You may need to reschedule your appointment if you arrive late (15 or more minutes).  Arriving late affects you and other patients whose appointments are after yours.  Also, if you miss three or more appointments without notifying the office, you may be dismissed from the clinic at the provider's discretion.      For prescription refill requests, have your pharmacy contact our office and allow 72 hours for refills to be completed.    Today you received the following chemotherapy and/or immunotherapy agents: Mvasi, Camptosar.      To help prevent nausea and vomiting after your treatment, we encourage you to take your nausea medication as directed.  BELOW ARE SYMPTOMS THAT SHOULD BE REPORTED IMMEDIATELY: *FEVER GREATER THAN 100.4 F (38 C) OR HIGHER *CHILLS OR SWEATING *NAUSEA AND VOMITING THAT IS NOT CONTROLLED WITH YOUR NAUSEA MEDICATION *UNUSUAL SHORTNESS OF BREATH *UNUSUAL BRUISING OR BLEEDING *URINARY PROBLEMS (pain or burning when urinating, or frequent urination) *BOWEL PROBLEMS (unusual diarrhea, constipation, pain near the anus) TENDERNESS IN MOUTH AND THROAT WITH OR WITHOUT PRESENCE OF ULCERS (sore throat, sores in mouth, or a toothache) UNUSUAL RASH, SWELLING OR PAIN  UNUSUAL VAGINAL DISCHARGE OR ITCHING   Items with * indicate a potential emergency and should be followed up as soon as possible or go to the Emergency Department if any problems should occur.  Please show the CHEMOTHERAPY ALERT CARD or IMMUNOTHERAPY ALERT CARD at  check-in to the Emergency Department and triage nurse.  Should you have questions after your visit or need to cancel or reschedule your appointment, please contact [facility]  Dept: [phone]  and follow the prompts.  Office hours are 8:00 a.m. to 4:30 p.m. Monday - Friday. Please note that voicemails left after 4:00 p.m. may not be returned until the following business day.  We are closed weekends and major holidays. You have access to a nurse at all times for urgent questions. Please call the main number to the clinic Dept: [phone] and follow the prompts.   For any non-urgent questions, you may also contact your provider using MyChart. We now offer e-Visits for anyone 40 and older to request care online for non-urgent symptoms. For details visit [email or web address].   Also download the MyChart app! Go to the app store, search "MyChart", open the app, select [facility], and log in with your MyChart username and password.  Masks are optional in the cancer centers. If you would like for your care team to wear a mask while they are taking care of you, please let them know. You may have one support person who is at least 54 years old accompany you for your appointments.

## 2022-04-25 NOTE — Progress Notes (Signed)
[facility] [place], [place] [address] [phone]   Interval Evaluation  Date of Service: 04/25/22 Patient Name: [name] Patient MRN: [id number] Patient DOB: 1965-11-30 Provider: Ventura Sellers, MD  Identifying Statement:  [name] is a 56 y.o. female with left temporal glioblastoma   Oncologic History: Oncology History  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[place])  11/21/2020 Initial Diagnosis   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[place])   11/21/2020 Cancer Staging   Staging form: Brain and Spinal Cord, AJCC 8th Edition - Pathologic stage from 11/21/2020: WHO Grade IV - Signed by Ventura Sellers, MD on 11/30/2020 Histopathologic type: Glioblastoma Stage prefix: Initial diagnosis Histologic grading system: 4 grade system Extent of surgical resection: Subtotal resection Karnofsky performance status: Score 90 Seizures at presentation: Present Duration of symptoms before diagnosis: Short IDH1 mutation: Negative   12/27/2020 - 02/07/2021 Radiation Therapy   IMRT and concurrent Temodar [name])   03/12/2021 - 08/26/2021 Chemotherapy   Completes 5 cycles of adjuvant 5-day Temodar   08/27/2021 Progression   POD #1   09/06/2021 -  Chemotherapy   Initiates second line therapy; CCNU 75m/m2 PO q6 weeks and avastin 176mkg IV q2 weeks   12/28/2021 Progression   POD #2   01/17/2022 -  Chemotherapy   Third line therapy with Irinotecan and Avastin q2 weeks     Biomarkers:  MGMT Methylated.  IDH 1/2 Wild type.  EGFR Unknown  TERT Unknown   Interval History: [name] GARRISONresents today for planned irinotecan and avastin.  No clinical changes today, no recurrence of seizures since increasing the vimpat and resuming decadron 104m36maily.  Short term memory impairment and fatigue are still persistent.       H+P (11/25/20) Patient presented to medical attention this past  month with first ever seizure.  She describes episode of sudden onset "disconnection" with tongue biting and urination, followed by period of confusion.  CNS imaging demonstrated enhancing mass within left anterior temporal lobe, which was mostly resected by Dr. OstZada Finders 11/21/20.  Since surgery she had not had recurrence of seizures.  She complains of painful cramping of her right leg, which started ~1 week prior.  At this time the cramping is her main complaint.  Otherwise fully functional, independent, would like to return to work if possible.  Medications: Current Outpatient Medications on File Prior to Visit  Medication Sig Dispense Refill   amLODipine (NORVASC) 10 MG tablet TAKE 1 TABLET BY MOUTH EVERY DAY 90 tablet 1   dexamethasone (DECADRON) 2 MG tablet Take 1 tablet (2 mg total) by mouth daily. 30 tablet 1   gabapentin (NEURONTIN) 100 MG capsule Take 1 capsule (100 mg total) by mouth 2 (two) times daily. 60 capsule 3   lacosamide (VIMPAT) 50 MG TABS tablet Take 2 tablets (100 mg total) by mouth every morning AND 1 tablet (50 mg total) at bedtime. 90 tablet 3   levETIRAcetam (KEPPRA) 750 MG tablet TAKE 2 TABLETS (1,500 MG TOTAL) BY MOUTH 2 (TWO) TIMES DAILY. 360 tablet 1   levothyroxine (SYNTHROID) 100 MCG tablet Take 1 tablet (100 mcg total) by mouth daily. 90 tablet 3   LORazepam (ATIVAN) 2 MG tablet Take 1 tablet (2 mg total) by mouth every 8 (eight) hours as needed for seizure. (Patient not taking: Reported on 01/31/2022) 12 tablet 0   ondansetron (ZOFRAN-ODT) 4 MG disintegrating tablet Take 1 tablet (4 mg total) by mouth  every 8 (eight) hours as needed for nausea or vomiting. (Patient not taking: Reported on 01/31/2022) 10 tablet 0   Potassium Chloride ER 20 MEQ TBCR TAKE 1 TABLET BY MOUTH EVERY DAY 90 tablet 3   prochlorperazine (COMPAZINE) 10 MG tablet Take 10 mg by mouth every 6 (six) hours as needed for nausea or vomiting. (Patient not taking: Reported on 04/04/2022)     No current  facility-administered medications on file prior to visit.    Allergies: No Known Allergies Past Medical History:  Past Medical History:  Diagnosis Date   Bell's palsy    left side of face- notied in smile and eyes, if very tired   Brain cancer ([place])    Complication of anesthesia    N/V   Essential hypertension    External hemorrhoids    History of blood transfusion    post op Hemorroids   History of pneumonia    Hyperlipidemia    Hyperthyroidism    hx 56 years old- oral meds   Iron deficiency anemia    Migraine headache    Pneumonia    x 2 - years ago   PONV (postoperative nausea and vomiting)    PPD positive    Pulmonary nodules    not seen on plain cxr, first detected 1/07 re ct 10/08 pos IPPD > 15 mm 12/09 FOB/lavage 04/20/08 neg afb smear   PVC's (premature ventricular contractions)    Seizure ([place]) 11/15/2020   Sleep apnea    refused CPAP- mouth piece recommended   Tuberculosis    in her late 30's   Past Surgical History:  Past Surgical History:  Procedure Laterality Date   ABDOMINAL HYSTERECTOMY     APPLICATION OF CRANIAL NAVIGATION N/A 11/21/2020   Procedure: APPLICATION OF CRANIAL NAVIGATION;  Surgeon: Judith Part, MD;  Location: [facility];  Service: Neurosurgery;  Laterality: N/A;   BREAST BIOPSY Right 2014   BREAST BIOPSY Left 10/29/2019   CHOLECYSTECTOMY  2014   COLONOSCOPY  05/04/2009   prolapsing external hemorrhoids   CRANIOTOMY Left 11/21/2020   Procedure: Left craniotomy for tumor resection;  Surgeon: Judith Part, MD;  Location: [facility];  Service: Neurosurgery;  Laterality: Left;   ENDOMETRIAL ABLATION     HEMORRHOID SURGERY     x 3   TUBAL LIGATION     Social History:  Social History   Socioeconomic History   Marital status: Divorced    Spouse name: Not on file   Number of children: 2   Years of education: Not on file   Highest education level: Associate degree: occupational, Hotel manager, or vocational program  Occupational  History   Occupation: accounts Sport and exercise psychologist: [employer]  Tobacco Use   Smoking status: Never   Smokeless tobacco: Never  Vaping Use   Vaping Use: Never used  Substance and Sexual Activity   Alcohol use: No    Alcohol/week: 0.0 standard drinks of alcohol   Drug use: No   Sexual activity: Not on file  Other Topics Concern   Not on file  Social History Narrative   Divorced with 2 children   She works as a Librarian, 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Press photographer    Possible TB exposure 2008 (after nodules found)   From [place] lived there and [place]   Never owned birds      Social Determinants of [place] Strain: Medium Risk (06/13/2021)   Overall Financial Resource Strain (CARDIA)    Difficulty of Paying Living  Expenses: Somewhat hard  Food Insecurity: Food Insecurity Present (04/23/2021)   Hunger Vital Sign    Worried About Running Out of Food in the Last Year: Sometimes true    Ran Out of Food in the Last Year: Never true  Transportation Needs: No Transportation Needs (06/13/2021)   PRAPARE - Hydrologist (Medical): No    Lack of Transportation (Non-Medical): No  Physical Activity: Unknown (04/23/2021)   Exercise Vital Sign    Days of Exercise per Week: 0 days    Minutes of Exercise per Session: Not on file  Stress: No Stress Concern Present (04/23/2021)   [place]    Feeling of Stress : Only a little  Social Connections: Moderately Integrated (04/23/2021)   Social Connection and Isolation Panel [NHANES]    Frequency of Communication with Friends and Family: Twice a week    Frequency of Social Gatherings with Friends and Family: Twice a week    Attends Religious Services: More than 4 times per year    Active Member of [employer] or Organizations: Yes    Attends Music therapist: More than 4 times per year    Marital Status: Divorced  Human resources officer Violence: Not on file   Family  History:  Family History  Problem Relation Age of Onset   Diabetes type II Mother    Kidney failure Sister    Diabetes Mellitus II Sister    Colon cancer Maternal Aunt        aunts and uncles   Diabetes Mellitus II Maternal Aunt    Pancreatic cancer Maternal Aunt    Colon cancer Maternal Uncle    Diabetes Mellitus II Maternal Uncle    Heart disease Maternal Grandmother        aunts, uncles, sister   Diabetes Mellitus II Maternal Grandmother    Lung cancer Other        aunts and uncles    Review of Systems: Constitutional: poor appetite Eyes: blurriness of vision Ears, nose, mouth, throat, and face: Doesn't report sore throat Respiratory: Doesn't report cough, dyspnea or wheezes Cardiovascular: Doesn't report palpitation, chest discomfort  Gastrointestinal:  Doesn't report nausea, constipation, diarrhea GU: Doesn't report incontinence Skin: Doesn't report skin rashes Neurological: Per HPI Musculoskeletal: bilateral shoulder pain Behavioral/Psych: +anxiety  Physical Exam: Vitals:   04/25/22 1023  BP: (!) 154/90  Pulse: 84  Resp: 17  Temp: 97.9 F (36.6 C)  SpO2: 99%   KPS: 70. General: Alert, cooperative, pleasant, in no acute distress Head: Normal EENT: No conjunctival injection or scleral icterus.  Lungs: Resp effort normal Cardiac: Regular rate Abdomen: Non-distended abdomen Skin: No rashes cyanosis or petechiae. Extremities: No clubbing or edema  Neurologic Exam: Mental Status: Awake, alert, attentive to examiner. Oriented to self and environment. Language is notable for modest impairment in fluency.  Impaired short term recall. Cranial Nerves: Visual acuity is grossly normal. Visual fields are full. Extra-ocular movements intact. No ptosis. Face L LMN paresis, chronic. Motor: Tone and bulk are normal. Power is full in both arms and legs. Reflexes are symmetric, no pathologic reflexes present.  Sensory: Intact to light touch Gait: Normal.   Labs: I have  reviewed the data as listed    Component Value Date/Time   NA 142 04/07/2022 1650   K 3.2 (L) 04/07/2022 1650   CL 114 (H) 04/07/2022 1650   CO2 21 (L) 04/07/2022 1650   GLUCOSE 76 04/07/2022 1650  BUN 15 04/07/2022 1650   CREATININE 0.86 04/07/2022 1650   CREATININE 0.85 04/04/2022 0959   CREATININE 0.92 01/25/2020 1424   CALCIUM 8.4 (L) 04/07/2022 1650   PROT 6.0 (L) 04/07/2022 1650   ALBUMIN 3.3 (L) 04/07/2022 1650   AST 17 04/07/2022 1650   AST 18 04/04/2022 0959   ALT 30 04/07/2022 1650   ALT 38 04/04/2022 0959   ALKPHOS 86 04/07/2022 1650   BILITOT 1.5 (H) 04/07/2022 1650   BILITOT 1.0 04/04/2022 0959   GFRNONAA >60 04/07/2022 1650   GFRNONAA >60 04/04/2022 0959   GFRNONAA 72 01/25/2020 1424   GFRAA 83 01/25/2020 1424   Lab Results  Component Value Date   WBC 6.2 04/25/2022   NEUTROABS 4.0 04/25/2022   HGB 12.5 04/25/2022   HCT 38.5 04/25/2022   MCV 97.7 04/25/2022   PLT 210 04/25/2022     Assessment/Plan 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[place]) [C71.9]  [name] is clinically stable today.  Labs demonstrate continued improvement in cytopenias.  Will recommend to administer both irinotecan and avastin today.  Chemotherapy should be held for the following:  ANC less than 1,000  Platelets less than 100,000  LFT or creatinine greater than 2x ULN  If clinical concerns/contraindications develop  Avastin should be held for the following:  ANC less than 500  Platelets less than 50,000  LFT or creatinine greater than 2x ULN  If clinical concerns/contraindications develop  For seizures, will con't Vimpat 100/100 and con't Keppra 1572m BID.    Decadron will be reduced to 182mdaily.  May con't gabapentin 1005mID for neuropathic head/neck pain.  Should continue to dose 37m22motassium daily.  We ask that [name] HAGWOODurn to clinic in 3 weeks prior to next scheduled infusion of CPT-11, Avastin and labs.  MRI  will be scheduled for 05/30/22.  All questions were answered. The patient knows to call the clinic with any problems, questions or concerns. No barriers to learning were detected.  The total time spent in the encounter was 30 minutes and more than 50% was on counseling and review of test results   [name] Medical Director of Neuro-Oncology [facility]21/23 10:23 AM

## 2022-04-26 ENCOUNTER — Telehealth: Payer: Self-pay | Admitting: Internal Medicine

## 2022-04-26 NOTE — Telephone Encounter (Signed)
Called patient to notify of new appointments. Patient notified.  

## 2022-05-05 ENCOUNTER — Other Ambulatory Visit: Payer: Self-pay | Admitting: Internal Medicine

## 2022-05-09 ENCOUNTER — Telehealth: Payer: Self-pay | Admitting: Adult Health

## 2022-05-09 NOTE — Telephone Encounter (Signed)
Pt called to say she has had a nose bleed for over 3 wks and wanted to speak to NP. Pt was offered an OV and declined, stating she just wanted to speak to NP.  Pt was also offered to speak to Triage Nurse, and stated that she has cancer so NP already knows her history and therefore she only wants to speak to him.  Please call Pt back at your earliest convenience.

## 2022-05-16 ENCOUNTER — Inpatient Hospital Stay: Payer: BC Managed Care – PPO | Attending: Internal Medicine

## 2022-05-16 ENCOUNTER — Inpatient Hospital Stay (HOSPITAL_BASED_OUTPATIENT_CLINIC_OR_DEPARTMENT_OTHER): Payer: BC Managed Care – PPO | Admitting: Internal Medicine

## 2022-05-16 ENCOUNTER — Inpatient Hospital Stay: Payer: BC Managed Care – PPO

## 2022-05-16 ENCOUNTER — Other Ambulatory Visit: Payer: Self-pay

## 2022-05-16 ENCOUNTER — Other Ambulatory Visit: Payer: Self-pay | Admitting: Internal Medicine

## 2022-05-16 VITALS — BP 153/93 | HR 84 | Temp 97.8°F | Resp 15 | Wt 166.1 lb

## 2022-05-16 VITALS — BP 131/90 | HR 59 | Temp 97.8°F | Resp 16

## 2022-05-16 DIAGNOSIS — R569 Unspecified convulsions: Secondary | ICD-10-CM | POA: Diagnosis not present

## 2022-05-16 DIAGNOSIS — Z5111 Encounter for antineoplastic chemotherapy: Secondary | ICD-10-CM | POA: Diagnosis not present

## 2022-05-16 DIAGNOSIS — C719 Malignant neoplasm of brain, unspecified: Secondary | ICD-10-CM | POA: Diagnosis not present

## 2022-05-16 DIAGNOSIS — I10 Essential (primary) hypertension: Secondary | ICD-10-CM | POA: Insufficient documentation

## 2022-05-16 DIAGNOSIS — C712 Malignant neoplasm of temporal lobe: Secondary | ICD-10-CM | POA: Diagnosis not present

## 2022-05-16 LAB — CMP (CANCER CENTER ONLY)
ALT: 12 U/L (ref 0–44)
AST: 11 U/L — ABNORMAL LOW (ref 15–41)
Albumin: 4.3 g/dL (ref 3.5–5.0)
Alkaline Phosphatase: 124 U/L (ref 38–126)
Anion gap: 7 (ref 5–15)
BUN: 16 mg/dL (ref 6–20)
CO2: 28 mmol/L (ref 22–32)
Calcium: 9.8 mg/dL (ref 8.9–10.3)
Chloride: 104 mmol/L (ref 98–111)
Creatinine: 0.73 mg/dL (ref 0.44–1.00)
GFR, Estimated: >60 mL/min (ref >60)
Glucose, Bld: 80 mg/dL (ref 70–99)
Potassium: 3.7 mmol/L (ref 3.5–5.1)
Sodium: 139 mmol/L (ref 135–145)
Total Bilirubin: 0.7 mg/dL (ref 0.3–1.2)
Total Protein: 7 g/dL (ref 6.5–8.1)

## 2022-05-16 LAB — CBC WITH DIFFERENTIAL (CANCER CENTER ONLY)
Abs Immature Granulocytes: 0.03 10*3/uL (ref 0.00–0.07)
Basophils Absolute: 0 10*3/uL (ref 0.0–0.1)
Basophils Relative: 1 %
Eosinophils Absolute: 0.1 10*3/uL (ref 0.0–0.5)
Eosinophils Relative: 2 %
HCT: 39.2 % (ref 36.0–46.0)
Hemoglobin: 13 g/dL (ref 12.0–15.0)
Immature Granulocytes: 1 %
Lymphocytes Relative: 27 %
Lymphs Abs: 1.2 10*3/uL (ref 0.7–4.0)
MCH: 31.6 pg (ref 26.0–34.0)
MCHC: 33.2 g/dL (ref 30.0–36.0)
MCV: 95.4 fL (ref 80.0–100.0)
Monocytes Absolute: 0.6 10*3/uL (ref 0.1–1.0)
Monocytes Relative: 13 %
Neutro Abs: 2.5 10*3/uL (ref 1.7–7.7)
Neutrophils Relative %: 56 %
Platelet Count: 213 10*3/uL (ref 150–400)
RBC: 4.11 MIL/uL (ref 3.87–5.11)
RDW: 15.9 % — ABNORMAL HIGH (ref 11.5–15.5)
WBC Count: 4.5 10*3/uL (ref 4.0–10.5)
nRBC: 0.4 % — ABNORMAL HIGH (ref 0.0–0.2)

## 2022-05-16 LAB — TOTAL PROTEIN, URINE DIPSTICK: Protein, ur: NEGATIVE mg/dL

## 2022-05-16 MED ORDER — SODIUM CHLORIDE 0.9 % IV SOLN
125.0000 mg/m2 | Freq: Once | INTRAVENOUS | Status: AC
  Administered 2022-05-16: 240 mg via INTRAVENOUS
  Filled 2022-05-16: qty 12

## 2022-05-16 MED ORDER — SODIUM CHLORIDE 0.9 % IV SOLN
10.0000 mg | Freq: Once | INTRAVENOUS | Status: AC
  Administered 2022-05-16: 10 mg via INTRAVENOUS
  Filled 2022-05-16: qty 10

## 2022-05-16 MED ORDER — SODIUM CHLORIDE 0.9 % IV SOLN: Freq: Once | INTRAVENOUS | Status: AC

## 2022-05-16 MED ORDER — SODIUM CHLORIDE 0.9 % IV SOLN
10.0000 mg/kg | Freq: Once | INTRAVENOUS | Status: AC
  Administered 2022-05-16: 700 mg via INTRAVENOUS
  Filled 2022-05-16: qty 16

## 2022-05-16 MED ORDER — PALONOSETRON HCL INJECTION 0.25 MG/5ML
0.2500 mg | Freq: Once | INTRAVENOUS | Status: AC
  Administered 2022-05-16: 0.25 mg via INTRAVENOUS
  Filled 2022-05-16: qty 5

## 2022-05-16 MED ORDER — ATROPINE SULFATE 1 MG/ML IV SOLN
0.5000 mg | Freq: Once | INTRAVENOUS | Status: AC | PRN
  Administered 2022-05-16: 0.5 mg via INTRAVENOUS
  Filled 2022-05-16: qty 1

## 2022-05-16 NOTE — Patient Instructions (Signed)
[facility]  Discharge Instructions: Thank you for choosing [facility] to provide your oncology and hematology care.   If you have a lab appointment with the [facility], please go directly to the [facility] and check in at the registration area.   Wear comfortable clothing and clothing appropriate for easy access to any Portacath or PICC line.   We strive to give you quality time with your provider. You may need to reschedule your appointment if you arrive late (15 or more minutes).  Arriving late affects you and other patients whose appointments are after yours.  Also, if you miss three or more appointments without notifying the office, you may be dismissed from the clinic at the provider's discretion.      For prescription refill requests, have your pharmacy contact our office and allow 72 hours for refills to be completed.    Today you received the following chemotherapy and/or immunotherapy agents: Bevacizumab and Irinotecan   To help prevent nausea and vomiting after your treatment, we encourage you to take your nausea medication as directed.  BELOW ARE SYMPTOMS THAT SHOULD BE REPORTED IMMEDIATELY: *FEVER GREATER THAN 100.4 F (38 C) OR HIGHER *CHILLS OR SWEATING *NAUSEA AND VOMITING THAT IS NOT CONTROLLED WITH YOUR NAUSEA MEDICATION *UNUSUAL SHORTNESS OF BREATH *UNUSUAL BRUISING OR BLEEDING *URINARY PROBLEMS (pain or burning when urinating, or frequent urination) *BOWEL PROBLEMS (unusual diarrhea, constipation, pain near the anus) TENDERNESS IN MOUTH AND THROAT WITH OR WITHOUT PRESENCE OF ULCERS (sore throat, sores in mouth, or a toothache) UNUSUAL RASH, SWELLING OR PAIN  UNUSUAL VAGINAL DISCHARGE OR ITCHING   Items with * indicate a potential emergency and should be followed up as soon as possible or go to the Emergency Department if any problems should occur.  Please show the CHEMOTHERAPY ALERT CARD or IMMUNOTHERAPY ALERT CARD  at check-in to the Emergency Department and triage nurse.  Should you have questions after your visit or need to cancel or reschedule your appointment, please contact [facility]  Dept: [phone]  and follow the prompts.  Office hours are 8:00 a.m. to 4:30 p.m. Monday - Friday. Please note that voicemails left after 4:00 p.m. may not be returned until the following business day.  We are closed weekends and major holidays. You have access to a nurse at all times for urgent questions. Please call the main number to the clinic Dept: [phone] and follow the prompts.   For any non-urgent questions, you may also contact your provider using MyChart. We now offer e-Visits for anyone 18 and older to request care online for non-urgent symptoms. For details visit [email or web address].   Also download the MyChart app! Go to the app store, search "MyChart", open the app, select [facility], and log in with your MyChart username and password. Bevacizumab Injection What is this medication? BEVACIZUMAB (be va SIZ yoo mab) treats some types of cancer. It works by blocking a protein that causes cancer cells to grow and multiply. This helps to slow or stop the spread of cancer cells. It is a monoclonal antibody. This medicine may be used for other purposes; ask your health care provider or pharmacist if you have questions. COMMON BRAND NAME(S): Alymsys, Avastin, MVASI, Noah Charon What should I tell my care team before I take this medication? They need to know if you have any of these conditions: Blood clots Coughing up blood Having or recent surgery Heart failure High blood pressure History of  a connection between 2 or more body parts that do not usually connect (fistula) History of a tear in your stomach or intestines Protein in your urine An unusual or allergic reaction to bevacizumab, other medications, foods, dyes, or preservatives Pregnant or trying to get  pregnant Breast-feeding How should I use this medication? This medication is injected into a vein. It is given by your care team in a hospital or clinic setting. Talk to your care team the use of this medication in children. Special care may be needed. Overdosage: If you think you have taken too much of this medicine contact a poison control center or emergency room at once. NOTE: This medicine is only for you. Do not share this medicine with others. What if I miss a dose? Keep appointments for follow-up doses. It is important not to miss your dose. Call your care team if you are unable to keep an appointment. What may interact with this medication? Interactions are not expected. This list may not describe all possible interactions. Give your health care provider a list of all the medicines, herbs, non-prescription drugs, or dietary supplements you use. Also tell them if you smoke, drink alcohol, or use illegal drugs. Some items may interact with your medicine. What should I watch for while using this medication? Your condition will be monitored carefully while you are receiving this medication. You may need blood work while taking this medication. This medication may make you feel generally unwell. This is not uncommon as chemotherapy can affect healthy cells as well as cancer cells. Report any side effects. Continue your course of treatment even though you feel ill unless your care team tells you to stop. This medication may increase your risk to bruise or bleed. Call your care team if you notice any unusual bleeding. Before having surgery, talk to your care team to make sure it is ok. This medication can increase the risk of poor healing of your surgical site or wound. You will need to stop this medication for 28 days before surgery. After surgery, wait at least 28 days before restarting this medication. Make sure the surgical site or wound is healed enough before restarting this medication. Talk  to your care team if questions. Talk to your care team if you may be pregnant. Serious birth defects can occur if you take this medication during pregnancy and for 6 months after the last dose. Contraception is recommended while taking this medication and for 6 months after the last dose. Your care team can help you find the option that works for you. Do not breastfeed while taking this medication and for 6 months after the last dose. This medication can cause infertility. Talk to your care team if you are concerned about your fertility. What side effects may I notice from receiving this medication? Side effects that you should report to your care team as soon as possible: Allergic reactions--skin rash, itching, hives, swelling of the face, lips, tongue, or throat Bleeding--bloody or black, tar-like stools, vomiting blood or brown material that looks like coffee grounds, red or dark brown urine, small red or purple spots on skin, unusual bruising or bleeding Blood clot--pain, swelling, or warmth in the leg, shortness of breath, chest pain Heart attack--pain or tightness in the chest, shoulders, arms, or jaw, nausea, shortness of breath, cold or clammy skin, feeling faint or lightheaded Heart failure--shortness of breath, swelling of the ankles, feet, or hands, sudden weight gain, unusual weakness or fatigue Increase in blood pressure Infection--fever,  chills, cough, sore throat, wounds that don't heal, pain or trouble when passing urine, general feeling of discomfort or being unwell Infusion reactions--chest pain, shortness of breath or trouble breathing, feeling faint or lightheaded Kidney injury--decrease in the amount of urine, swelling of the ankles, hands, or feet Stomach pain that is severe, does not go away, or gets worse Stroke--sudden numbness or weakness of the face, arm, or leg, trouble speaking, confusion, trouble walking, loss of balance or coordination, dizziness, severe headache,  change in vision Sudden and severe headache, confusion, change in vision, seizures, which may be signs of posterior reversible encephalopathy syndrome (PRES) Side effects that usually do not require medical attention (report to your care team if they continue or are bothersome): Back pain Change in taste Diarrhea Dry skin Increased tears Nosebleed This list may not describe all possible side effects. Call your doctor for medical advice about side effects. You may report side effects to FDA at 1-800-FDA-1088. Where should I keep my medication? This medication is given in a hospital or clinic. It will not be stored at home. NOTE: This sheet is a summary. It may not cover all possible information. If you have questions about this medicine, talk to your doctor, pharmacist, or health care provider.  2023 Elsevier/Gold Standard (2021-08-24 00:00:00)  Irinotecan Injection What is this medication? IRINOTECAN (ir in oh TEE kan) treats some types of cancer. It works by slowing down the growth of cancer cells. This medicine may be used for other purposes; ask your health care provider or pharmacist if you have questions. COMMON BRAND NAME(S): Camptosar What should I tell my care team before I take this medication? They need to know if you have any of these conditions: Dehydration Diarrhea Infection, especially a viral infection, such as chickenpox, cold sores, herpes Liver disease Low blood cell levels (white cells, red cells, and platelets) Low levels of electrolytes, such as calcium, magnesium, or potassium in your blood Recent or ongoing radiation An unusual or allergic reaction to irinotecan, other medications, foods, dyes, or preservatives If you or your partner are pregnant or trying to get pregnant Breast-feeding How should I use this medication? This medication is injected into a vein. It is given by your care team in a hospital or clinic setting. Talk to your care team about the use  of this medication in children. Special care may be needed. Overdosage: If you think you have taken too much of this medicine contact a poison control center or emergency room at once. NOTE: This medicine is only for you. Do not share this medicine with others. What if I miss a dose? Keep appointments for follow-up doses. It is important not to miss your dose. Call your care team if you are unable to keep an appointment. What may interact with this medication? Do not take this medication with any of the following: Cobicistat Itraconazole This medication may also interact with the following: Certain antibiotics, such as clarithromycin, rifampin, rifabutin Certain antivirals for HIV or AIDS Certain medications for fungal infections, such as ketoconazole, posaconazole, voriconazole Certain medications for seizures, such as carbamazepine, phenobarbital, phenytoin Gemfibrozil Nefazodone St. John's wort This list may not describe all possible interactions. Give your health care provider a list of all the medicines, herbs, non-prescription drugs, or dietary supplements you use. Also tell them if you smoke, drink alcohol, or use illegal drugs. Some items may interact with your medicine. What should I watch for while using this medication? Your condition will be monitored carefully while  you are receiving this medication. You may need blood work while taking this medication. This medication may make you feel generally unwell. This is not uncommon as chemotherapy can affect healthy cells as well as cancer cells. Report any side effects. Continue your course of treatment even though you feel ill unless your care team tells you to stop. This medication can cause serious side effects. To reduce the risk, your care team may give you other medications to take before receiving this one. Be sure to follow the directions from your care team. This medication may affect your coordination, reaction time, or  judgement. Do not drive or operate machinery until you know how this medication affects you. Sit up or stand slowly to reduce the risk of dizzy or fainting spells. Drinking alcohol with this medication can increase the risk of these side effects. This medication may increase your risk of getting an infection. Call your care team for advice if you get a fever, chills, sore throat, or other symptoms of a cold or flu. Do not treat yourself. Try to avoid being around people who are sick. Avoid taking medications that contain aspirin, acetaminophen, ibuprofen, naproxen, or ketoprofen unless instructed by your care team. These medications may hide a fever. This medication may increase your risk to bruise or bleed. Call your care team if you notice any unusual bleeding. Be careful brushing or flossing your teeth or using a toothpick because you may get an infection or bleed more easily. If you have any dental work done, tell your dentist you are receiving this medication. Talk to your care team if you or your partner are pregnant or think either of you might be pregnant. This medication can cause serious birth defects if taken during pregnancy and for 6 months after the last dose. You will need a negative pregnancy test before starting this medication. Contraception is recommended while taking this medication and for 6 months after the last dose. Your care team can help you find the option that works for you. Do not father a child while taking this medication and for 3 months after the last dose. Use a condom for contraception during this time period. Do not breastfeed while taking this medication and for 7 days after the last dose. This medication may cause infertility. Talk to your care team if you are concerned about your fertility. What side effects may I notice from receiving this medication? Side effects that you should report to your care team as soon as possible: Allergic reactions--skin rash, itching,  hives, swelling of the face, lips, tongue, or throat Dry cough, shortness of breath or trouble breathing Increased saliva or tears, increased sweating, stomach cramping, diarrhea, small pupils, unusual weakness or fatigue, slow heartbeat Infection--fever, chills, cough, sore throat, wounds that don't heal, pain or trouble when passing urine, general feeling of discomfort or being unwell Kidney injury--decrease in the amount of urine, swelling of the ankles, hands, or feet Low red blood cell level--unusual weakness or fatigue, dizziness, headache, trouble breathing Severe or prolonged diarrhea Unusual bruising or bleeding Side effects that usually do not require medical attention (report to your care team if they continue or are bothersome): Constipation Diarrhea Hair loss Loss of appetite Nausea Stomach pain This list may not describe all possible side effects. Call your doctor for medical advice about side effects. You may report side effects to FDA at 1-800-FDA-1088. Where should I keep my medication? This medication is given in a hospital or clinic. It will not  be stored at home. NOTE: This sheet is a summary. It may not cover all possible information. If you have questions about this medicine, talk to your doctor, pharmacist, or health care provider.  2023 Elsevier/Gold Standard (2021-08-30 00:00:00)

## 2022-05-16 NOTE — Progress Notes (Signed)
[facility] [place], [place] [address] [phone]   Interval Evaluation  Date of Service: 05/16/22 Patient Name: [name] Patient MRN: [id number] Patient DOB: 04-Oct-1965 Provider: Ventura Sellers, MD  Identifying Statement:  [name] is a 57 y.o. female with left temporal glioblastoma   Oncologic History: Oncology History  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[place])  11/21/2020 Initial Diagnosis   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[place])   11/21/2020 Cancer Staging   Staging form: Brain and Spinal Cord, AJCC 8th Edition - Pathologic stage from 11/21/2020: WHO Grade IV - Signed by Ventura Sellers, MD on 11/30/2020 Histopathologic type: Glioblastoma Stage prefix: Initial diagnosis Histologic grading system: 4 grade system Extent of surgical resection: Subtotal resection Karnofsky performance status: Score 90 Seizures at presentation: Present Duration of symptoms before diagnosis: Short IDH1 mutation: Negative   12/27/2020 - 02/07/2021 Radiation Therapy   IMRT and concurrent Temodar Isidore Moos)   03/12/2021 - 08/26/2021 Chemotherapy   Completes 5 cycles of adjuvant 5-day Temodar   08/27/2021 Progression   POD #1   09/06/2021 -  Chemotherapy   Initiates second line therapy; CCNU '90mg'$ /m2 PO q6 weeks and avastin '10mg'$ /kg IV q2 weeks   12/28/2021 Progression   POD #2   01/17/2022 -  Chemotherapy   Third line therapy with Irinotecan and Avastin q2 weeks     Biomarkers:  MGMT Methylated.  IDH 1/2 Wild type.  EGFR Unknown  TERT Unknown   Interval History: [name] presents today for planned irinotecan and avastin.  Today she describes occasional episodes of sharp pain in back of neck radiating up to top of head; frequency is 1-2x per day, pain lasts for 10-15 seconds.  Otherwise no new or progressive changes, no recurrence of seizures.  Decadron at '1mg'$  daily  currently.  Short term memory impairment and fatigue are still persistent.       H+P (11/25/20) Patient presented to medical attention this past month with first ever seizure.  She describes episode of sudden onset "disconnection" with tongue biting and urination, followed by period of confusion.  CNS imaging demonstrated enhancing mass within left anterior temporal lobe, which was mostly resected by Dr. Zada Finders on 11/21/20.  Since surgery she had not had recurrence of seizures.  She complains of painful cramping of her right leg, which started ~1 week prior.  At this time the cramping is her main complaint.  Otherwise fully functional, independent, would like to return to work if possible.  Medications: Current Outpatient Medications on File Prior to Visit  Medication Sig Dispense Refill   amLODipine (NORVASC) 10 MG tablet TAKE 1 TABLET BY MOUTH EVERY DAY 90 tablet 1   dexamethasone (DECADRON) 1 MG tablet Take 1 tablet (1 mg total) by mouth daily. 60 tablet 1   lacosamide (VIMPAT) 50 MG TABS tablet TAKE 2 TABLETS (100 MG TOTAL) BY MOUTH EVERY MORNING AND 1 TABLET (50 MG TOTAL) AT BEDTIME. 270 tablet 2   levETIRAcetam (KEPPRA) 750 MG tablet TAKE 2 TABLETS (1,500 MG TOTAL) BY MOUTH 2 (TWO) TIMES DAILY. 360 tablet 1   levothyroxine (SYNTHROID) 100 MCG tablet Take 1 tablet (100 mcg total) by mouth daily. 90 tablet 3   LORazepam (ATIVAN) 2 MG tablet Take 1 tablet (2 mg total) by mouth every 8 (eight) hours as needed for seizure. 12 tablet 0   ondansetron (ZOFRAN-ODT) 4 MG disintegrating tablet Take 1 tablet (4 mg total) by mouth  every 8 (eight) hours as needed for nausea or vomiting. 10 tablet 0   Potassium Chloride ER 20 MEQ TBCR TAKE 1 TABLET BY MOUTH EVERY DAY 90 tablet 3   prochlorperazine (COMPAZINE) 10 MG tablet Take 10 mg by mouth every 6 (six) hours as needed for nausea or vomiting.     gabapentin (NEURONTIN) 100 MG capsule Take 1 capsule (100 mg total) by mouth 2 (two) times daily. (Patient not  taking: Reported on 05/16/2022) 60 capsule 3   No current facility-administered medications on file prior to visit.    Allergies: No Known Allergies Past Medical History:  Past Medical History:  Diagnosis Date   Bell's palsy    left side of face- notied in smile and eyes, if very tired   Brain cancer ([place])    Complication of anesthesia    N/V   Essential hypertension    External hemorrhoids    History of blood transfusion    post op Hemorroids   History of pneumonia    Hyperlipidemia    Hyperthyroidism    hx 57 years old- oral meds   Iron deficiency anemia    Migraine headache    Pneumonia    x 2 - years ago   PONV (postoperative nausea and vomiting)    PPD positive    Pulmonary nodules    not seen on plain cxr, first detected 1/07 re ct 10/08 pos IPPD > 15 mm 12/09 FOB/lavage 04/20/08 neg afb smear   PVC's (premature ventricular contractions)    Seizure ([facility]) 11/15/2020   Sleep apnea    refused CPAP- mouth piece recommended   Tuberculosis    in her late 30's   Past Surgical History:  Past Surgical History:  Procedure Laterality Date   ABDOMINAL HYSTERECTOMY     APPLICATION OF CRANIAL NAVIGATION N/A 11/21/2020   Procedure: APPLICATION OF CRANIAL NAVIGATION;  Surgeon: Judith Part, MD;  Location: [facility];  Service: Neurosurgery;  Laterality: N/A;   BREAST BIOPSY Right 2014   BREAST BIOPSY Left 10/29/2019   CHOLECYSTECTOMY  2014   COLONOSCOPY  05/04/2009   prolapsing external hemorrhoids   CRANIOTOMY Left 11/21/2020   Procedure: Left craniotomy for tumor resection;  Surgeon: Judith Part, MD;  Location: [facility];  Service: Neurosurgery;  Laterality: Left;   ENDOMETRIAL ABLATION     HEMORRHOID SURGERY     x 3   TUBAL LIGATION     Social History:  Social History   Socioeconomic History   Marital status: Divorced    Spouse name: Not on file   Number of children: 2   Years of education: Not on file   Highest education level: Associate degree:  occupational, Hotel manager, or vocational program  Occupational History   Occupation: accounts Sport and exercise psychologist: [employer]  Tobacco Use   Smoking status: Never   Smokeless tobacco: Never  Vaping Use   Vaping Use: Never used  Substance and Sexual Activity   Alcohol use: No    Alcohol/week: 0.0 standard drinks of alcohol   Drug use: No   Sexual activity: Not on file  Other Topics Concern   Not on file  Social History Narrative   Divorced with 2 children   She works as a Librarian, 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Press [employer]    Possible TB exposure 2008 (after nodules found)   From [place] lived there and [place]   Never owned birds      Social Determinants of Radio broadcast assistant Strain: [employer]  Risk (06/13/2021)   Overall Financial Resource Strain (CARDIA)    Difficulty of Paying Living Expenses: Somewhat hard  Food Insecurity: Food Insecurity Present (04/23/2021)   Hunger Vital Sign    Worried About Running Out of Food in the Last Year: Sometimes true    Ran Out of Food in the Last Year: Never true  Transportation Needs: No Transportation Needs (06/13/2021)   PRAPARE - Hydrologist (Medical): No    Lack of Transportation (Non-Medical): No  Physical Activity: Unknown (04/23/2021)   Exercise Vital Sign    Days of Exercise per Week: 0 days    Minutes of Exercise per Session: Not on file  Stress: No Stress Concern Present (04/23/2021)   [place]    Feeling of Stress : Only a little  Social Connections: Moderately Integrated (04/23/2021)   Social Connection and Isolation Panel [NHANES]    Frequency of Communication with Friends and Family: Twice a week    Frequency of Social Gatherings with Friends and Family: Twice a week    Attends Religious Services: More than 4 times per year    Active Member of [employer] or Organizations: Yes    Attends Music therapist: More than 4 times per year    Marital Status:  Divorced  Human resources officer Violence: Not on file   Family History:  Family History  Problem Relation Age of Onset   Diabetes type II Mother    Kidney failure Sister    Diabetes Mellitus II Sister    Colon cancer Maternal Aunt        aunts and uncles   Diabetes Mellitus II Maternal Aunt    Pancreatic cancer Maternal Aunt    Colon cancer Maternal Uncle    Diabetes Mellitus II Maternal Uncle    Heart disease Maternal Grandmother        aunts, uncles, sister   Diabetes Mellitus II Maternal Grandmother    Lung cancer Other        aunts and uncles    Review of Systems: Constitutional: poor appetite Eyes: blurriness of vision Ears, nose, mouth, throat, and face: Doesn't report sore throat Respiratory: Doesn't report cough, dyspnea or wheezes Cardiovascular: Doesn't report palpitation, chest discomfort  Gastrointestinal:  Doesn't report nausea, constipation, diarrhea GU: Doesn't report incontinence Skin: Doesn't report skin rashes Neurological: Per HPI Musculoskeletal: bilateral shoulder pain Behavioral/Psych: +anxiety  Physical Exam: Vitals:   05/16/22 1209  BP: (!) 153/93  Pulse: 84  Resp: 15  Temp: 97.8 F (36.6 C)  SpO2: 100%   KPS: 70. General: Alert, cooperative, pleasant, in no acute distress Head: Normal EENT: No conjunctival injection or scleral icterus.  Lungs: Resp effort normal Cardiac: Regular rate Abdomen: Non-distended abdomen Skin: No rashes cyanosis or petechiae. Extremities: No clubbing or edema  Neurologic Exam: Mental Status: Awake, alert, attentive to examiner. Oriented to self and environment. Language is notable for modest impairment in fluency.  Impaired short term recall. Cranial Nerves: Visual acuity is grossly normal. Visual fields are full. Extra-ocular movements intact. No ptosis. Face L LMN paresis, chronic. Motor: Tone and bulk are normal. Power is full in both arms and legs. Reflexes are symmetric, no pathologic reflexes present.   Sensory: Intact to light touch Gait: Normal.   Labs: I have reviewed the data as listed    Component Value Date/Time   NA 139 05/16/2022 1058   K 3.7 05/16/2022 1058   CL 104 05/16/2022 1058  CO2 28 05/16/2022 1058   GLUCOSE 80 05/16/2022 1058   BUN 16 05/16/2022 1058   CREATININE 0.73 05/16/2022 1058   CREATININE 0.92 01/25/2020 1424   CALCIUM 9.8 05/16/2022 1058   PROT 7.0 05/16/2022 1058   ALBUMIN 4.3 05/16/2022 1058   AST 11 (L) 05/16/2022 1058   ALT 12 05/16/2022 1058   ALKPHOS 124 05/16/2022 1058   BILITOT 0.7 05/16/2022 1058   GFRNONAA >60 05/16/2022 1058   GFRNONAA 72 01/25/2020 1424   GFRAA 83 01/25/2020 1424   Lab Results  Component Value Date   WBC 4.5 05/16/2022   NEUTROABS 2.5 05/16/2022   HGB 13.0 05/16/2022   HCT 39.2 05/16/2022   MCV 95.4 05/16/2022   PLT 213 05/16/2022     Assessment/Plan Focal seizures ([place]) [R56.9]  Sheyli KAILA DEVRIES is clinically stable today.  No new or progressive changes.  Will recommend to administer both irinotecan and avastin today.  Chemotherapy should be held for the following:  ANC less than 1,000  Platelets less than 100,000  LFT or creatinine greater than 2x ULN  If clinical concerns/contraindications develop  Avastin should be held for the following:  ANC less than 500  Platelets less than 50,000  LFT or creatinine greater than 2x ULN  If clinical concerns/contraindications develop  For seizures, will con't Vimpat 100/100 and con't Keppra '1500mg'$  BID.    Decadron will con't '1mg'$  daily.  May resume gabapentin '100mg'$  BID for neuropathic head/neck pain, potentially occipital neuralgia today.  Should continue to dose 78mq potassium daily.  We ask that TSELIN EISLERreturn to clinic in 3 weeks prior to next scheduled infusion of CPT-11, Avastin and labs.  MRI scheduled for 05/30/22.  All questions were answered. The patient knows to call the clinic with any problems, questions or concerns. No  barriers to learning were detected.  The total time spent in the encounter was 30 minutes and more than 50% was on counseling and review of test results   ZVentura Sellers MD Medical Director of Neuro-Oncology [facility] [place]01/11/24 12:19 PM

## 2022-05-17 ENCOUNTER — Other Ambulatory Visit: Payer: Self-pay

## 2022-05-17 DIAGNOSIS — C719 Malignant neoplasm of brain, unspecified: Secondary | ICD-10-CM

## 2022-05-27 ENCOUNTER — Other Ambulatory Visit: Payer: Self-pay | Admitting: Radiation Therapy

## 2022-05-29 ENCOUNTER — Encounter: Payer: Self-pay | Admitting: Adult Health

## 2022-05-29 ENCOUNTER — Ambulatory Visit (INDEPENDENT_AMBULATORY_CARE_PROVIDER_SITE_OTHER): Payer: BC Managed Care – PPO | Admitting: Adult Health

## 2022-05-29 VITALS — BP 122/84 | HR 88 | Temp 98.6°F | Ht 67.0 in | Wt 163.0 lb

## 2022-05-29 DIAGNOSIS — J0141 Acute recurrent pansinusitis: Secondary | ICD-10-CM

## 2022-05-29 MED ORDER — DOXYCYCLINE HYCLATE 100 MG PO CAPS: 100.0000 mg | ORAL_CAPSULE | Freq: Two times a day (BID) | ORAL | 0 refills | Status: DC

## 2022-05-29 NOTE — Progress Notes (Signed)
Subjective:    Patient ID: [name], female    DOB: 1966/01/04, 57 y.o.   MRN: [id number]  HPI  57 year old female who  has a past medical history of Bell's palsy, Brain cancer ([place]), Complication of anesthesia, Essential hypertension, External hemorrhoids, History of blood transfusion, History of pneumonia, Hyperlipidemia, Hyperthyroidism, Iron deficiency anemia, Migraine headache, Pneumonia, PONV (postoperative nausea and vomiting), PPD positive, Pulmonary nodules, PVC's (premature ventricular contractions), Seizure ([place]) (11/15/2020), Sleep apnea, and Tuberculosis.  She presents to the office today for an acute issue. Her symptoms started about two weeks ago. Symptoms include that of sinus pain and pressure, sinus congestion, rhinorrhea, bilateral  ear pain,  productive cough and mildly sore throat  She denies shortness of breath, wheezing, chills, or fevers.  She has been using Tylenol at home to help with the cough   Review of Systems See HPI   Past Medical History:  Diagnosis Date   Bell's palsy    left side of face- notied in smile and eyes, if very tired   Brain cancer ([facility])    Complication of anesthesia    N/V   Essential hypertension    External hemorrhoids    History of blood transfusion    post op Hemorroids   History of pneumonia    Hyperlipidemia    Hyperthyroidism    hx 57 years old- oral meds   Iron deficiency anemia    Migraine headache    Pneumonia    x 2 - years ago   PONV (postoperative nausea and vomiting)    PPD positive    Pulmonary nodules    not seen on plain cxr, first detected 1/07 re ct 10/08 pos IPPD > 15 mm 12/09 FOB/lavage 04/20/08 neg afb smear   PVC's (premature ventricular contractions)    Seizure ([name]) 11/15/2020   Sleep apnea    refused CPAP- mouth piece recommended   Tuberculosis    in her late 1's    Social History   Socioeconomic History   Marital status: Divorced    Spouse name: Not on file   Number of children: 2    Years of education: Not on file   Highest education level: Associate degree: occupational, Hotel manager, or vocational program  Occupational History   Occupation: accounts Sport and exercise psychologist: [employer]  Tobacco Use   Smoking status: Never   Smokeless tobacco: Never  Vaping Use   Vaping Use: Never used  Substance and Sexual Activity   Alcohol use: No    Alcohol/week: 0.0 standard drinks of alcohol   Drug use: No   Sexual activity: Not on file  Other Topics Concern   Not on file  Social History Narrative   Divorced with 2 children   She works as a Librarian, 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Press photographer    Possible TB exposure 2008 (after nodules found)   From [place] lived there and [place]   Never owned birds      Social Determinants of Health   Financial Resource Strain: Medium Risk (06/13/2021)   Overall Financial Resource Strain (CARDIA)    Difficulty of Paying Living Expenses: Somewhat hard  Food Insecurity: Food Insecurity Present (04/23/2021)   Hunger Vital Sign    Worried About Running Out of Food in the Last Year: Sometimes true    Ran Out of Food in the Last Year: Never true  Transportation Needs: No Transportation Needs (06/13/2021)   PRAPARE - Hydrologist (Medical): No  Lack of Transportation (Non-Medical): No  Physical Activity: Unknown (04/23/2021)   Exercise Vital Sign    Days of Exercise per Week: 0 days    Minutes of Exercise per Session: Not on file  Stress: No Stress Concern Present (04/23/2021)   [place]    Feeling of Stress : Only a little  Social Connections: Moderately Integrated (04/23/2021)   Social Connection and Isolation Panel [NHANES]    Frequency of Communication with Friends and Family: Twice a week    Frequency of Social Gatherings with Friends and Family: Twice a week    Attends Religious Services: More than 4 times per year    Active Member of [employer] or Organizations: Yes     Attends Music therapist: More than 4 times per year    Marital Status: Divorced  Human resources officer Violence: Not on file    Past Surgical History:  Procedure Laterality Date   ABDOMINAL HYSTERECTOMY     APPLICATION OF CRANIAL NAVIGATION N/A 11/21/2020   Procedure: [place];  Surgeon: Judith Part, MD;  Location: [facility];  Service: Neurosurgery;  Laterality: N/A;   BREAST BIOPSY Right 2014   BREAST BIOPSY Left 10/29/2019   CHOLECYSTECTOMY  2014   COLONOSCOPY  05/04/2009   prolapsing external hemorrhoids   CRANIOTOMY Left 11/21/2020   Procedure: Left craniotomy for tumor resection;  Surgeon: Judith Part, MD;  Location: [facility];  Service: Neurosurgery;  Laterality: Left;   ENDOMETRIAL ABLATION     HEMORRHOID SURGERY     x 3   TUBAL LIGATION      Family History  Problem Relation Age of Onset   Diabetes type II Mother    Kidney failure Sister    Diabetes Mellitus II Sister    Colon cancer Maternal Aunt        aunts and uncles   Diabetes Mellitus II Maternal Aunt    Pancreatic cancer Maternal Aunt    Colon cancer Maternal Uncle    Diabetes Mellitus II Maternal Uncle    Heart disease Maternal Grandmother        aunts, uncles, sister   Diabetes Mellitus II Maternal Grandmother    Lung cancer Other        aunts and uncles    No Known Allergies  Current Outpatient Medications on File Prior to Visit  Medication Sig Dispense Refill   amLODipine (NORVASC) 10 MG tablet TAKE 1 TABLET BY MOUTH EVERY DAY 90 tablet 1   dexamethasone (DECADRON) 1 MG tablet Take 1 tablet (1 mg total) by mouth daily. 60 tablet 1   gabapentin (NEURONTIN) 100 MG capsule Take 1 capsule (100 mg total) by mouth 2 (two) times daily. 60 capsule 3   lacosamide (VIMPAT) 50 MG TABS tablet TAKE 2 TABLETS (100 MG TOTAL) BY MOUTH EVERY MORNING AND 1 TABLET (50 MG TOTAL) AT BEDTIME. 270 tablet 2   levETIRAcetam (KEPPRA) 750 MG tablet TAKE 2 TABLETS (1,500 MG TOTAL) BY  MOUTH 2 (TWO) TIMES DAILY. 360 tablet 1   levothyroxine (SYNTHROID) 100 MCG tablet Take 1 tablet (100 mcg total) by mouth daily. 90 tablet 3   LORazepam (ATIVAN) 2 MG tablet Take 1 tablet (2 mg total) by mouth every 8 (eight) hours as needed for seizure. 12 tablet 0   ondansetron (ZOFRAN-ODT) 4 MG disintegrating tablet Take 1 tablet (4 mg total) by mouth every 8 (eight) hours as needed for nausea or vomiting. 10 tablet 0  Potassium Chloride ER 20 MEQ TBCR TAKE 1 TABLET BY MOUTH EVERY DAY 90 tablet 3   prochlorperazine (COMPAZINE) 10 MG tablet Take 10 mg by mouth every 6 (six) hours as needed for nausea or vomiting.     No current facility-administered medications on file prior to visit.    BP 122/84   Pulse 88   Temp 98.6 F (37 C) (Oral)   Ht '5\' 7"'$  (1.702 m)   Wt 163 lb (73.9 kg)   SpO2 98%   BMI 25.53 kg/m       Objective:   Physical Exam Vitals and nursing note reviewed.  Constitutional:      General: She is not in acute distress.    Appearance: Normal appearance. She is not ill-appearing or toxic-appearing.  HENT:     Right Ear: A middle ear effusion is present. Tympanic membrane is not erythematous or bulging.     Left Ear: A middle ear effusion is present. Tympanic membrane is not erythematous or bulging.     Nose: Congestion and rhinorrhea present. Rhinorrhea is purulent.     Right Turbinates: Enlarged and swollen.     Left Turbinates: Enlarged and swollen.     Right Sinus: Maxillary sinus tenderness and frontal sinus tenderness present.     Left Sinus: Maxillary sinus tenderness and frontal sinus tenderness present.     Mouth/Throat:     Mouth: Mucous membranes are moist.     Pharynx: Oropharynx is clear. No oropharyngeal exudate or posterior oropharyngeal erythema.  Cardiovascular:     Rate and Rhythm: Normal rate and regular rhythm.     Pulses: Normal pulses.     Heart sounds: Normal heart sounds.  Pulmonary:     Effort: Pulmonary effort is normal.     Breath  sounds: Normal breath sounds.  Musculoskeletal:        General: Normal range of motion.  Skin:    General: Skin is warm and dry.  Neurological:     General: No focal deficit present.     Mental Status: She is alert and oriented to person, place, and time.  Psychiatric:        Mood and Affect: Mood normal.        Behavior: Behavior normal.        Thought Content: Thought content normal.        Judgment: Judgment normal.       Assessment & Plan:  1. Acute recurrent pansinusitis - will treat due to symptoms and duration. - Follow up if not improving in the next 2-3 days  - Also advised Mucinex and NS nasal spray  - doxycycline (VIBRAMYCIN) 100 MG capsule; Take 1 capsule (100 mg total) by mouth 2 (two) times daily.  Dispense: 14 capsule; Refill: 0  [name], NP

## 2022-05-30 ENCOUNTER — Other Ambulatory Visit: Payer: BC Managed Care – PPO

## 2022-05-30 ENCOUNTER — Ambulatory Visit: Payer: BC Managed Care – PPO

## 2022-05-30 ENCOUNTER — Ambulatory Visit (HOSPITAL_COMMUNITY)
Admission: RE | Admit: 2022-05-30 | Discharge: 2022-05-30 | Disposition: A | Discharge status: Home / Self Care | Payer: BC Managed Care – PPO | Source: Ambulatory Visit | Attending: Internal Medicine | Admitting: Internal Medicine

## 2022-05-30 ENCOUNTER — Ambulatory Visit: Payer: BC Managed Care – PPO | Admitting: Internal Medicine

## 2022-05-30 DIAGNOSIS — H0520 Unspecified exophthalmos: Secondary | ICD-10-CM | POA: Diagnosis not present

## 2022-05-30 DIAGNOSIS — C719 Malignant neoplasm of brain, unspecified: Secondary | ICD-10-CM

## 2022-05-30 MED ORDER — GADOBUTROL 1 MMOL/ML IV SOLN
7.0000 mL | Freq: Once | INTRAVENOUS | Status: AC | PRN
  Administered 2022-05-30: 7 mL via INTRAVENOUS

## 2022-06-02 ENCOUNTER — Other Ambulatory Visit: Payer: Self-pay | Admitting: Adult Health

## 2022-06-03 ENCOUNTER — Inpatient Hospital Stay: Payer: BC Managed Care – PPO

## 2022-06-06 ENCOUNTER — Other Ambulatory Visit: Payer: Self-pay | Admitting: *Deleted

## 2022-06-06 ENCOUNTER — Inpatient Hospital Stay: Payer: BC Managed Care – PPO

## 2022-06-06 ENCOUNTER — Other Ambulatory Visit: Payer: Self-pay | Admitting: Radiation Therapy

## 2022-06-06 ENCOUNTER — Other Ambulatory Visit: Payer: Self-pay

## 2022-06-06 ENCOUNTER — Inpatient Hospital Stay (HOSPITAL_BASED_OUTPATIENT_CLINIC_OR_DEPARTMENT_OTHER): Payer: BC Managed Care – PPO | Admitting: Internal Medicine

## 2022-06-06 ENCOUNTER — Inpatient Hospital Stay: Payer: BC Managed Care – PPO | Attending: Internal Medicine

## 2022-06-06 VITALS — BP 146/97 | HR 78 | Resp 17

## 2022-06-06 VITALS — BP 148/90 | HR 82 | Temp 97.3°F | Resp 20 | Wt 167.9 lb

## 2022-06-06 DIAGNOSIS — C712 Malignant neoplasm of temporal lobe: Secondary | ICD-10-CM | POA: Diagnosis not present

## 2022-06-06 DIAGNOSIS — Z79899 Other long term (current) drug therapy: Secondary | ICD-10-CM | POA: Diagnosis not present

## 2022-06-06 DIAGNOSIS — C719 Malignant neoplasm of brain, unspecified: Secondary | ICD-10-CM

## 2022-06-06 DIAGNOSIS — I10 Essential (primary) hypertension: Secondary | ICD-10-CM | POA: Diagnosis not present

## 2022-06-06 DIAGNOSIS — Z5111 Encounter for antineoplastic chemotherapy: Secondary | ICD-10-CM | POA: Diagnosis not present

## 2022-06-06 DIAGNOSIS — R569 Unspecified convulsions: Secondary | ICD-10-CM | POA: Diagnosis not present

## 2022-06-06 LAB — CBC WITH DIFFERENTIAL (CANCER CENTER ONLY)
Abs Immature Granulocytes: 0.03 10*3/uL (ref 0.00–0.07)
Basophils Absolute: 0 10*3/uL (ref 0.0–0.1)
Basophils Relative: 0 %
Eosinophils Absolute: 0.1 10*3/uL (ref 0.0–0.5)
Eosinophils Relative: 2 %
HCT: 39.8 % (ref 36.0–46.0)
Hemoglobin: 13.1 g/dL (ref 12.0–15.0)
Immature Granulocytes: 1 %
Lymphocytes Relative: 29 %
Lymphs Abs: 1.4 10*3/uL (ref 0.7–4.0)
MCH: 30.9 pg (ref 26.0–34.0)
MCHC: 32.9 g/dL (ref 30.0–36.0)
MCV: 93.9 fL (ref 80.0–100.0)
Monocytes Absolute: 0.5 10*3/uL (ref 0.1–1.0)
Monocytes Relative: 10 %
Neutro Abs: 2.7 10*3/uL (ref 1.7–7.7)
Neutrophils Relative %: 58 %
Platelet Count: 199 10*3/uL (ref 150–400)
RBC: 4.24 MIL/uL (ref 3.87–5.11)
RDW: 16.2 % — ABNORMAL HIGH (ref 11.5–15.5)
WBC Count: 4.7 10*3/uL (ref 4.0–10.5)
nRBC: 0.4 % — ABNORMAL HIGH (ref 0.0–0.2)

## 2022-06-06 LAB — CMP (CANCER CENTER ONLY)
ALT: 17 U/L (ref 0–44)
AST: 12 U/L — ABNORMAL LOW (ref 15–41)
Albumin: 4.1 g/dL (ref 3.5–5.0)
Alkaline Phosphatase: 127 U/L — ABNORMAL HIGH (ref 38–126)
Anion gap: 5 (ref 5–15)
BUN: 16 mg/dL (ref 6–20)
CO2: 28 mmol/L (ref 22–32)
Calcium: 9.8 mg/dL (ref 8.9–10.3)
Chloride: 105 mmol/L (ref 98–111)
Creatinine: 0.74 mg/dL (ref 0.44–1.00)
GFR, Estimated: >60 mL/min (ref >60)
Glucose, Bld: 110 mg/dL — ABNORMAL HIGH (ref 70–99)
Potassium: 3.7 mmol/L (ref 3.5–5.1)
Sodium: 138 mmol/L (ref 135–145)
Total Bilirubin: 0.6 mg/dL (ref 0.3–1.2)
Total Protein: 7 g/dL (ref 6.5–8.1)

## 2022-06-06 LAB — TOTAL PROTEIN, URINE DIPSTICK: Protein, ur: NEGATIVE mg/dL

## 2022-06-06 MED ORDER — LORAZEPAM 2 MG/ML IJ SOLN
INTRAMUSCULAR | Status: AC
  Filled 2022-06-06: qty 1

## 2022-06-06 MED ORDER — LORAZEPAM 2 MG/ML IJ SOLN: 1.0000 mg | Freq: Once | INTRAMUSCULAR | Status: AC

## 2022-06-06 MED ORDER — LORAZEPAM 2 MG/ML IJ SOLN: 1.0000 mg | Freq: Once | INTRAMUSCULAR | Status: DC

## 2022-06-06 MED ORDER — SODIUM CHLORIDE 0.9 % IV SOLN
10.0000 mg | Freq: Once | INTRAVENOUS | Status: DC
  Filled 2022-06-06: qty 1

## 2022-06-06 MED ORDER — SODIUM CHLORIDE 0.9 % IV SOLN: Freq: Once | INTRAVENOUS | Status: AC

## 2022-06-06 MED ORDER — ATROPINE SULFATE 1 MG/ML IV SOLN
0.5000 mg | Freq: Once | INTRAVENOUS | Status: DC
  Filled 2022-06-06: qty 1

## 2022-06-06 MED ORDER — SODIUM CHLORIDE 0.9 % IV SOLN
10.0000 mg/kg | Freq: Once | INTRAVENOUS | Status: DC
  Filled 2022-06-06: qty 28

## 2022-06-06 MED ORDER — LORAZEPAM 2 MG/ML IJ SOLN
INTRAMUSCULAR | Status: AC
  Administered 2022-06-06: 1 mg via INTRAVENOUS
  Filled 2022-06-06: qty 1

## 2022-06-06 MED ORDER — LEVETIRACETAM 1000 MG PO TABS: 1000.0000 mg | ORAL_TABLET | Freq: Two times a day (BID) | ORAL | 3 refills | Status: DC

## 2022-06-06 MED ORDER — IRINOTECAN HCL CHEMO INJECTION 100 MG/5ML
125.0000 mg/m2 | Freq: Once | INTRAVENOUS | Status: DC
  Filled 2022-06-06: qty 12

## 2022-06-06 MED ORDER — LEVETIRACETAM 750 MG PO TABS: 1500.0000 mg | ORAL_TABLET | Freq: Two times a day (BID) | ORAL | 2 refills | Status: DC

## 2022-06-06 MED ORDER — PALONOSETRON HCL INJECTION 0.25 MG/5ML
0.2500 mg | Freq: Once | INTRAVENOUS | Status: AC
  Administered 2022-06-06: 0.25 mg via INTRAVENOUS
  Filled 2022-06-06: qty 5

## 2022-06-06 NOTE — Progress Notes (Signed)
At 12:30 this RN was speaking with the patient and her presentation changed. When asked how she was feeling she stated that she felt like she was starting to have another seizure but the medication was "working against it". This lasted for 2 minutes then the patient stated that "the feeling was gone and now I feel exhausted". Patient then slept comfortably until discharge. Her daughter was present and she was comfortable going home with the patient. Recommended that someone remain with the patient today. Educated the daughter on the time that ativan was administered here and to call 911 if any further seizure activity today. Patient verbalized understanding. Patient was awake, alert yet fatigued and assisted to the front pick up area by the NT with the patient daughter present.

## 2022-06-06 NOTE — Progress Notes (Signed)
Patient family member alerted this RN that the patient was not feeling well. Spoke with the patient and she stated that she was feeling "rough", like she does before she has a seizure. Family member stated that she has medication that she places under her tongue and showed this RN a bottle of ativan '2mg'$  po. Advised the patient to take 1 dose. Contacted Dr. Mickeal Skinner and made him aware of the concern. The patient then began to seize, body stiffening, clenched fists,eyes rolled back. Seizure started at 11:56, Dr. Mickeal Skinner at chair side and received VO for ativan IV '1mg'$ . Ativan administered at 11:57. Seizure ended 12:58. Per Dr. Mickeal Skinner patient will not receive treatment today. Patient will remain for observation for 0.5-1 hour and okay for discharge. See flowsheet for VS.

## 2022-06-06 NOTE — Addendum Note (Signed)
Addended by: Ventura Sellers on: 06/06/2022 12:03 PM   Modules accepted: Orders

## 2022-06-06 NOTE — Progress Notes (Addendum)
[name] at [name] [name], [name] [id number] [phone]   Interval Evaluation  Date of Service: 06/06/22 Patient Name: [name] Patient MRN: [id number] Patient DOB: April 01, 1966 Provider: Ventura Sellers, MD  Identifying Statement:  [name] is a 57 y.o. female with left temporal glioblastoma   Oncologic History: Oncology History  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[place])  11/21/2020 Initial Diagnosis   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[place])   11/21/2020 Cancer Staging   Staging form: Brain and Spinal Cord, AJCC 8th Edition - Pathologic stage from 11/21/2020: WHO Grade IV - Signed by Ventura Sellers, MD on 11/30/2020 Histopathologic type: Glioblastoma Stage prefix: Initial diagnosis Histologic grading system: 4 grade system Extent of surgical resection: Subtotal resection Karnofsky performance status: Score 90 Seizures at presentation: Present Duration of symptoms before diagnosis: Short IDH1 mutation: Negative   12/27/2020 - 02/07/2021 Radiation Therapy   IMRT and concurrent Temodar [name])   03/12/2021 - 08/26/2021 Chemotherapy   Completes 5 cycles of adjuvant 5-day Temodar   08/27/2021 Progression   POD #1   09/06/2021 -  Chemotherapy   Initiates second line therapy; CCNU '90mg'$ /m2 PO q6 weeks and avastin '10mg'$ /kg IV q2 weeks   12/28/2021 Progression   POD #2   01/17/2022 -  Chemotherapy   Third line therapy with Irinotecan and Avastin q2 weeks     Biomarkers:  MGMT Methylated.  IDH 1/2 Wild type.  EGFR Unknown  TERT Unknown   Interval History: [name] presents today for planned irinotecan and avastin.  No new or progressive neurologic complaints today.  No seizures in the past 6 months.  Decadron at '1mg'$  daily currently.  Short term memory impairment and fatigue are still persistent.       H+P (11/25/20) Patient presented to medical attention this  past month with first ever seizure.  She describes episode of sudden onset "disconnection" with tongue biting and urination, followed by period of confusion.  CNS imaging demonstrated enhancing mass within left anterior temporal lobe, which was mostly resected by Dr. Zada Finders on 11/21/20.  Since surgery she had not had recurrence of seizures.  She complains of painful cramping of her right leg, which started ~1 week prior.  At this time the cramping is her main complaint.  Otherwise fully functional, independent, would like to return to work if possible.  Medications: Current Outpatient Medications on File Prior to Visit  Medication Sig Dispense Refill   amLODipine (NORVASC) 10 MG tablet TAKE 1 TABLET BY MOUTH EVERY DAY 90 tablet 1   dexamethasone (DECADRON) 1 MG tablet Take 1 tablet (1 mg total) by mouth daily. 60 tablet 1   doxycycline (VIBRAMYCIN) 100 MG capsule Take 1 capsule (100 mg total) by mouth 2 (two) times daily. 14 capsule 0   gabapentin (NEURONTIN) 100 MG capsule Take 1 capsule (100 mg total) by mouth 2 (two) times daily. 60 capsule 3   lacosamide (VIMPAT) 50 MG TABS tablet TAKE 2 TABLETS (100 MG TOTAL) BY MOUTH EVERY MORNING AND 1 TABLET (50 MG TOTAL) AT BEDTIME. 270 tablet 2   levETIRAcetam (KEPPRA) 750 MG tablet TAKE 2 TABLETS (1,500 MG TOTAL) BY MOUTH 2 (TWO) TIMES DAILY. 360 tablet 1   levothyroxine (SYNTHROID) 100 MCG tablet Take 1 tablet (100 mcg total) by mouth daily. 90 tablet 3   LORazepam (ATIVAN) 2 MG tablet Take 1 tablet (2 mg total) by mouth every 8 (eight) hours as needed for  seizure. 12 tablet 0   ondansetron (ZOFRAN-ODT) 4 MG disintegrating tablet Take 1 tablet (4 mg total) by mouth every 8 (eight) hours as needed for nausea or vomiting. 10 tablet 0   Potassium Chloride ER 20 MEQ TBCR TAKE 1 TABLET BY MOUTH EVERY DAY 90 tablet 3   prochlorperazine (COMPAZINE) 10 MG tablet Take 10 mg by mouth every 6 (six) hours as needed for nausea or vomiting.     No current  facility-administered medications on file prior to visit.    Allergies: No Known Allergies Past Medical History:  Past Medical History:  Diagnosis Date   Bell's palsy    left side of face- notied in smile and eyes, if very tired   Brain cancer ([place])    Complication of anesthesia    N/V   Essential hypertension    External hemorrhoids    History of blood transfusion    post op Hemorroids   History of pneumonia    Hyperlipidemia    Hyperthyroidism    hx 57 years old- oral meds   Iron deficiency anemia    Migraine headache    Pneumonia    x 2 - years ago   PONV (postoperative nausea and vomiting)    PPD positive    Pulmonary nodules    not seen on plain cxr, first detected 1/07 re ct 10/08 pos IPPD > 15 mm 12/09 FOB/lavage 04/20/08 neg afb smear   PVC's (premature ventricular contractions)    Seizure ([place]) 11/15/2020   Sleep apnea    refused CPAP- mouth piece recommended   Tuberculosis    in her late 30's   Past Surgical History:  Past Surgical History:  Procedure Laterality Date   ABDOMINAL HYSTERECTOMY     APPLICATION OF CRANIAL NAVIGATION N/A 11/21/2020   Procedure: APPLICATION OF CRANIAL NAVIGATION;  Surgeon: [name] Part, MD;  Location: [facility];  Service: Neurosurgery;  Laterality: N/A;   BREAST BIOPSY Right 2014   BREAST BIOPSY Left 10/29/2019   CHOLECYSTECTOMY  2014   COLONOSCOPY  05/04/2009   prolapsing external hemorrhoids   CRANIOTOMY Left 11/21/2020   Procedure: Left craniotomy for tumor resection;  Surgeon: [name] Part, MD;  Location: [facility];  Service: Neurosurgery;  Laterality: Left;   ENDOMETRIAL ABLATION     HEMORRHOID SURGERY     x 3   TUBAL LIGATION     Social History:  Social History   Socioeconomic History   Marital status: Divorced    Spouse name: Not on file   Number of children: 2   Years of education: Not on file   Highest education level: Associate degree: occupational, Hotel manager, or vocational program  Occupational  History   Occupation: accounts Sport and exercise psychologist: [employer]  Tobacco Use   Smoking status: Never   Smokeless tobacco: Never  Vaping Use   Vaping Use: Never used  Substance and Sexual Activity   Alcohol use: No    Alcohol/week: 0.0 standard drinks of alcohol   Drug use: No   Sexual activity: Not on file  Other Topics Concern   Not on file  Social History Narrative   Divorced with 2 children   She works as a Librarian, 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[place] TB exposure 2008 (after nodules found)   From [place] lived there and [place]   Never owned birds      Social Determinants of [place] Strain: Medium Risk (06/13/2021)   Overall Emergency planning/management officer Strain (  CARDIA)    Difficulty of Paying Living Expenses: Somewhat hard  Food Insecurity: Food Insecurity Present (04/23/2021)   Hunger Vital Sign    Worried About Running Out of Food in the Last Year: Sometimes true    Ran Out of Food in the Last Year: Never true  Transportation Needs: No Transportation Needs (06/13/2021)   PRAPARE - Hydrologist (Medical): No    Lack of Transportation (Non-Medical): No  Physical Activity: Unknown (04/23/2021)   Exercise Vital Sign    Days of Exercise per Week: 0 days    Minutes of Exercise per Session: Not on file  Stress: No Stress Concern Present (04/23/2021)   [place]    Feeling of Stress : Only a little  Social Connections: Moderately Integrated (04/23/2021)   Social Connection and Isolation Panel [NHANES]    Frequency of Communication with Friends and Family: Twice a week    Frequency of Social Gatherings with Friends and Family: Twice a week    Attends Religious Services: More than 4 times per year    Active Member of [employer] or Organizations: Yes    Attends Music therapist: More than 4 times per year    Marital Status: Divorced  Human resources officer Violence: Not on file   Family  History:  Family History  Problem Relation Age of Onset   Diabetes type II Mother    Kidney failure Sister    Diabetes Mellitus II Sister    Colon cancer Maternal Aunt        aunts and uncles   Diabetes Mellitus II Maternal Aunt    Pancreatic cancer Maternal Aunt    Colon cancer Maternal Uncle    Diabetes Mellitus II Maternal Uncle    Heart disease Maternal Grandmother        aunts, uncles, sister   Diabetes Mellitus II Maternal Grandmother    Lung cancer Other        aunts and uncles    Review of Systems: Constitutional: poor appetite Eyes: blurriness of vision Ears, nose, mouth, throat, and face: Doesn't report sore throat Respiratory: Doesn't report cough, dyspnea or wheezes Cardiovascular: Doesn't report palpitation, chest discomfort  Gastrointestinal:  Doesn't report nausea, constipation, diarrhea GU: Doesn't report incontinence Skin: Doesn't report skin rashes Neurological: Per HPI Musculoskeletal: bilateral shoulder pain Behavioral/Psych: +anxiety  Physical Exam: Vitals:   06/06/22 1002  BP: (!) 148/90  Pulse: 82  Resp: 20  Temp: (!) 97.3 F (36.3 C)  SpO2: 100%    KPS: 70. General: Alert, cooperative, pleasant, in no acute distress Head: Normal EENT: No conjunctival injection or scleral icterus.  Lungs: Resp effort normal Cardiac: Regular rate Abdomen: Non-distended abdomen Skin: No rashes cyanosis or petechiae. Extremities: No clubbing or edema  Neurologic Exam: Mental Status: Awake, alert, attentive to examiner. Oriented to self and environment. Language is notable for modest impairment in fluency.  Impaired short term recall. Cranial Nerves: Visual acuity is grossly normal. Visual fields are full. Extra-ocular movements intact. No ptosis. Face L LMN paresis, chronic. Motor: Tone and bulk are normal. Power is full in both arms and legs. Reflexes are symmetric, no pathologic reflexes present.  Sensory: Intact to light touch Gait:  Normal.   Labs: I have reviewed the data as listed    Component Value Date/Time   NA 139 05/16/2022 1058   K 3.7 05/16/2022 1058   CL 104 05/16/2022 1058   CO2 28 05/16/2022 1058  GLUCOSE 80 05/16/2022 1058   BUN 16 05/16/2022 1058   CREATININE 0.73 05/16/2022 1058   CREATININE 0.92 01/25/2020 1424   CALCIUM 9.8 05/16/2022 1058   PROT 7.0 05/16/2022 1058   ALBUMIN 4.3 05/16/2022 1058   AST 11 (L) 05/16/2022 1058   ALT 12 05/16/2022 1058   ALKPHOS 124 05/16/2022 1058   BILITOT 0.7 05/16/2022 1058   GFRNONAA >60 05/16/2022 1058   GFRNONAA 72 01/25/2020 1424   GFRAA 83 01/25/2020 1424   Lab Results  Component Value Date   WBC 4.7 06/06/2022   NEUTROABS 2.7 06/06/2022   HGB 13.1 06/06/2022   HCT 39.8 06/06/2022   MCV 93.9 06/06/2022   PLT 199 06/06/2022    Imaging:  [facility] Clinician Interpretation: I have personally reviewed the CNS images as listed.  My interpretation, in the context of the patient's clinical presentation, is treatment effect vs true progression  MR BRAIN W WO CONTRAST  Result Date: 05/30/2022 CLINICAL DATA:  Brain/CNS neoplasm, assess treatment response. Glioblastoma. EXAM: MRI HEAD WITHOUT AND WITH CONTRAST TECHNIQUE: Multiplanar, multiecho pulse sequences of the brain and surrounding structures were obtained without and with intravenous contrast. CONTRAST:  3m GADAVIST GADOBUTROL 1 MMOL/ML IV SOLN COMPARISON:  Head MRI 03/14/2022 FINDINGS: Brain: Postsurgical changes are again seen with encephalomalacia anteriorly in the left temporal lobe. There is a new 4 mm focus of restricted diffusion within the white matter along the medial aspect of the atrium of the left lateral ventricle with associated mild T2 hyperintensity and no appreciable enhancement. Multiple other foci of restricted diffusion in the left parietal/periatrial white matter more posteriorly, laterally, and inferiorly as well as a confluent region of restricted diffusion in the left temporal stem  are unchanged from the prior MRI. These again demonstrate varying degrees of intrinsic T1 hyperintensity and at most faint enhancement. A 3 mm focus of restricted diffusion in the left globus pallidus is also new with faint T2 hyperintensity and no enhancement. Confluent T2 hyperintensity in the white matter of the left temporal lobe extending into the occipital, parietal, and frontal lobes as well as the deep white matter tracks at the level of the basal ganglia is unchanged from the prior MRI. There is no significant associated mass effect. A few scattered foci of chronic cerebral microhemorrhage are noted elsewhere. No acute large territory infarct, midline shift, or extra-axial fluid collection is evident. There is mild ex vacuo dilatation of the left lateral ventricle. Vascular: Major intracranial vascular flow voids are preserved. Skull and upper cervical spine: Left pterional craniotomy. No suspicious marrow lesion. Sinuses/Orbits: Chronic bilateral proptosis. Mild mucosal thickening in the paranasal sinuses. Mild enlargement of a mucous retention cyst in the right maxillary sinus. Trace right mastoid fluid. Other: None. IMPRESSION: 1. Two new punctate foci of restricted diffusion in the left periatrial white matter and left globus pallidus without enhancement, indeterminate for tumor versus treatment effects or incidental small vessel infarcts. 2. Otherwise unchanged appearance of left cerebral hemispheric tumor and post treatment changes. Electronically Signed   By: ALogan BoresM.D.   On: 05/30/2022 12:05     Assessment/Plan 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[facility] [C71.9]  Fidelia MHAILI DONOFRIOis clinically stable today. MRI brain demonstrates mainly stable findings, with two small progressive foci of restricted diffusion only.  Will recommend to proceed with both irinotecan and avastin today.  Chemotherapy should be held for the following:  ANC less than 1,000  Platelets  less than 100,000  LFT or creatinine greater than  2x ULN  If clinical concerns/contraindications develop  Avastin should be held for the following:  ANC less than 500  Platelets less than 50,000  LFT or creatinine greater than 2x ULN  If clinical concerns/contraindications develop  For seizures, will con't Vimpat 100/100.  Keppra will con't '1500mg'$  BID.  Decadron will con't '1mg'$  daily.  May resume gabapentin '100mg'$  BID for neuropathic head/neck pain, potentially occipital neuralgia today.  Addendum: Patient experienced a focal seizure in infusion, prior to receiving treatment.  IV ativan was administered.  We recommended holding off on infusion for today, will return to try again in 1 week.  We ask that [name] return to clinic in 1 weeks prior to next scheduled infusion of CPT-11, Avastin and labs.  Next MRI in 9 weeks.  All questions were answered. The patient knows to call the clinic with any problems, questions or concerns. No barriers to learning were detected.  The total time spent in the encounter was 40 minutes and more than 50% was on counseling and review of test results   Ventura Sellers, MD Medical Director of Neuro-Oncology [facility] 06/06/22 10:11 AM

## 2022-06-11 ENCOUNTER — Other Ambulatory Visit: Payer: Self-pay | Admitting: *Deleted

## 2022-06-11 DIAGNOSIS — C719 Malignant neoplasm of brain, unspecified: Secondary | ICD-10-CM

## 2022-06-13 ENCOUNTER — Other Ambulatory Visit: Payer: Self-pay

## 2022-06-13 ENCOUNTER — Inpatient Hospital Stay: Payer: BC Managed Care – PPO

## 2022-06-13 ENCOUNTER — Inpatient Hospital Stay (HOSPITAL_BASED_OUTPATIENT_CLINIC_OR_DEPARTMENT_OTHER): Payer: BC Managed Care – PPO | Admitting: Internal Medicine

## 2022-06-13 VITALS — BP 146/90 | HR 81 | Resp 16

## 2022-06-13 VITALS — BP 150/97 | HR 117 | Temp 98.0°F | Resp 17 | Wt 167.1 lb

## 2022-06-13 DIAGNOSIS — C719 Malignant neoplasm of brain, unspecified: Secondary | ICD-10-CM

## 2022-06-13 DIAGNOSIS — Z79899 Other long term (current) drug therapy: Secondary | ICD-10-CM | POA: Diagnosis not present

## 2022-06-13 DIAGNOSIS — R569 Unspecified convulsions: Secondary | ICD-10-CM | POA: Diagnosis not present

## 2022-06-13 DIAGNOSIS — C712 Malignant neoplasm of temporal lobe: Secondary | ICD-10-CM | POA: Diagnosis not present

## 2022-06-13 DIAGNOSIS — I10 Essential (primary) hypertension: Secondary | ICD-10-CM | POA: Diagnosis not present

## 2022-06-13 DIAGNOSIS — Z5111 Encounter for antineoplastic chemotherapy: Secondary | ICD-10-CM | POA: Diagnosis not present

## 2022-06-13 LAB — CMP (CANCER CENTER ONLY)
ALT: 93 U/L — ABNORMAL HIGH (ref 0–44)
AST: 43 U/L — ABNORMAL HIGH (ref 15–41)
Albumin: 4 g/dL (ref 3.5–5.0)
Alkaline Phosphatase: 115 U/L (ref 38–126)
Anion gap: 6 (ref 5–15)
BUN: 11 mg/dL (ref 6–20)
CO2: 28 mmol/L (ref 22–32)
Calcium: 9.9 mg/dL (ref 8.9–10.3)
Chloride: 108 mmol/L (ref 98–111)
Creatinine: 0.85 mg/dL (ref 0.44–1.00)
GFR, Estimated: >60 mL/min (ref >60)
Glucose, Bld: 134 mg/dL — ABNORMAL HIGH (ref 70–99)
Potassium: 4.2 mmol/L (ref 3.5–5.1)
Sodium: 142 mmol/L (ref 135–145)
Total Bilirubin: 0.9 mg/dL (ref 0.3–1.2)
Total Protein: 6.6 g/dL (ref 6.5–8.1)

## 2022-06-13 LAB — CBC WITH DIFFERENTIAL (CANCER CENTER ONLY)
Abs Immature Granulocytes: 0.01 10*3/uL (ref 0.00–0.07)
Basophils Absolute: 0 10*3/uL (ref 0.0–0.1)
Basophils Relative: 1 %
Eosinophils Absolute: 0.1 10*3/uL (ref 0.0–0.5)
Eosinophils Relative: 2 %
HCT: 40.5 % (ref 36.0–46.0)
Hemoglobin: 13 g/dL (ref 12.0–15.0)
Immature Granulocytes: 0 %
Lymphocytes Relative: 22 %
Lymphs Abs: 0.9 10*3/uL (ref 0.7–4.0)
MCH: 30.7 pg (ref 26.0–34.0)
MCHC: 32.1 g/dL (ref 30.0–36.0)
MCV: 95.7 fL (ref 80.0–100.0)
Monocytes Absolute: 0.6 10*3/uL (ref 0.1–1.0)
Monocytes Relative: 14 %
Neutro Abs: 2.4 10*3/uL (ref 1.7–7.7)
Neutrophils Relative %: 61 %
Platelet Count: 174 10*3/uL (ref 150–400)
RBC: 4.23 MIL/uL (ref 3.87–5.11)
RDW: 16.1 % — ABNORMAL HIGH (ref 11.5–15.5)
WBC Count: 4 10*3/uL (ref 4.0–10.5)
nRBC: 0 % (ref 0.0–0.2)

## 2022-06-13 LAB — TOTAL PROTEIN, URINE DIPSTICK: Protein, ur: NEGATIVE mg/dL

## 2022-06-13 MED ORDER — SODIUM CHLORIDE 0.9 % IV SOLN
10.0000 mg | Freq: Once | INTRAVENOUS | Status: AC
  Administered 2022-06-13: 10 mg via INTRAVENOUS
  Filled 2022-06-13: qty 10

## 2022-06-13 MED ORDER — PALONOSETRON HCL INJECTION 0.25 MG/5ML
0.2500 mg | Freq: Once | INTRAVENOUS | Status: AC
  Administered 2022-06-13: 0.25 mg via INTRAVENOUS
  Filled 2022-06-13: qty 5

## 2022-06-13 MED ORDER — LACOSAMIDE 100 MG PO TABS: 100.0000 mg | ORAL_TABLET | Freq: Two times a day (BID) | ORAL | 2 refills | Status: DC

## 2022-06-13 MED ORDER — SODIUM CHLORIDE 0.9 % IV SOLN
10.0000 mg/kg | Freq: Once | INTRAVENOUS | Status: AC
  Administered 2022-06-13: 700 mg via INTRAVENOUS
  Filled 2022-06-13: qty 12

## 2022-06-13 MED ORDER — ATROPINE SULFATE 1 MG/ML IV SOLN
0.5000 mg | Freq: Once | INTRAVENOUS | Status: AC | PRN
  Administered 2022-06-13: 0.5 mg via INTRAVENOUS
  Filled 2022-06-13: qty 1

## 2022-06-13 MED ORDER — SODIUM CHLORIDE 0.9 % IV SOLN: Freq: Once | INTRAVENOUS | Status: AC

## 2022-06-13 MED ORDER — SODIUM CHLORIDE 0.9 % IV SOLN
125.0000 mg/m2 | Freq: Once | INTRAVENOUS | Status: AC
  Administered 2022-06-13: 240 mg via INTRAVENOUS
  Filled 2022-06-13: qty 12

## 2022-06-13 NOTE — Patient Instructions (Signed)
[facility]  Discharge Instructions: Thank you for choosing [facility] to provide your oncology and hematology care.   If you have a lab appointment with the [facility], please go directly to the [facility] and check in at the registration area.   Wear comfortable clothing and clothing appropriate for easy access to any Portacath or PICC line.   We strive to give you quality time with your provider. You may need to reschedule your appointment if you arrive late (15 or more minutes).  Arriving late affects you and other patients whose appointments are after yours.  Also, if you miss three or more appointments without notifying the office, you may be dismissed from the clinic at the provider's discretion.      For prescription refill requests, have your pharmacy contact our office and allow 72 hours for refills to be completed.    Today you received the following chemotherapy and/or immunotherapy agents: MVASI & Irinotecan    To help prevent nausea and vomiting after your treatment, we encourage you to take your nausea medication as directed.  BELOW ARE SYMPTOMS THAT SHOULD BE REPORTED IMMEDIATELY: *FEVER GREATER THAN 100.4 F (38 C) OR HIGHER *CHILLS OR SWEATING *NAUSEA AND VOMITING THAT IS NOT CONTROLLED WITH YOUR NAUSEA MEDICATION *UNUSUAL SHORTNESS OF BREATH *UNUSUAL BRUISING OR BLEEDING *URINARY PROBLEMS (pain or burning when urinating, or frequent urination) *BOWEL PROBLEMS (unusual diarrhea, constipation, pain near the anus) TENDERNESS IN MOUTH AND THROAT WITH OR WITHOUT PRESENCE OF ULCERS (sore throat, sores in mouth, or a toothache) UNUSUAL RASH, SWELLING OR PAIN  UNUSUAL VAGINAL DISCHARGE OR ITCHING   Items with * indicate a potential emergency and should be followed up as soon as possible or go to the Emergency Department if any problems should occur.  Please show the CHEMOTHERAPY ALERT CARD or IMMUNOTHERAPY ALERT CARD  at check-in to the Emergency Department and triage nurse.  Should you have questions after your visit or need to cancel or reschedule your appointment, please contact [facility]  Dept: [phone]  and follow the prompts.  Office hours are 8:00 a.m. to 4:30 p.m. Monday - Friday. Please note that voicemails left after 4:00 p.m. may not be returned until the following business day.  We are closed weekends and major holidays. You have access to a nurse at all times for urgent questions. Please call the main number to the clinic Dept: [phone] and follow the prompts.   For any non-urgent questions, you may also contact your provider using MyChart. We now offer e-Visits for anyone 43 and older to request care online for non-urgent symptoms. For details visit [email or web address].   Also download the MyChart app! Go to the app store, search "MyChart", open the app, select [facility], and log in with your MyChart username and password.

## 2022-06-13 NOTE — Progress Notes (Signed)
[facility] [place],  [address] [phone]   Interval Evaluation  Date of Service: 06/13/22 Patient Name: [name] Patient MRN: [id number] Patient DOB: 10/03/1965 Provider: Ventura Sellers, MD  Identifying Statement:  [name] is a 57 y.o. female with left temporal glioblastoma   Oncologic History: Oncology History  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[name])  11/21/2020 Initial Diagnosis   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[name])   11/21/2020 Cancer Staging   Staging form: Brain and Spinal Cord, AJCC 8th Edition - Pathologic stage from 11/21/2020: WHO Grade IV - Signed by Ventura Sellers, MD on 11/30/2020 Histopathologic type: Glioblastoma Stage prefix: Initial diagnosis Histologic grading system: 4 grade system Extent of surgical resection: Subtotal resection Karnofsky performance status: Score 90 Seizures at presentation: Present Duration of symptoms before diagnosis: Short [name]   12/27/2020 - 02/07/2021 Radiation Therapy   IMRT and concurrent Temodar [name])   03/12/2021 - 08/26/2021 Chemotherapy   Completes 5 cycles of adjuvant 5-day Temodar   08/27/2021 Progression   POD #1   09/06/2021 -  Chemotherapy   Initiates second line therapy; CCNU '90mg'$ /m2 PO q6 weeks and avastin '10mg'$ /kg IV q2 weeks   12/28/2021 Progression   POD #2   01/17/2022 -  Chemotherapy   Third line therapy with Irinotecan and Avastin q2 weeks     Biomarkers:  MGMT Methylated.  IDH 1/2 Wild type.  EGFR Unknown  TERT Unknown   Interval History: [name] presents today for planned irinotecan and avastin.  No seizures since the events in infusion last week.  No new or progressive neurologic complaints today.  No longer dosing decadron.  Short term memory impairment and fatigue are still persistent.       H+P (11/25/20) Patient presented to medical  attention this past month with first ever seizure.  She describes episode of sudden onset "disconnection" with tongue biting and urination, followed by period of confusion.  CNS imaging demonstrated enhancing mass within left anterior temporal lobe, which was mostly resected by Dr. Zada Finders on 11/21/20.  Since surgery she had not had recurrence of seizures.  She complains of painful cramping of her right leg, which started ~1 week prior.  At this time the cramping is her main complaint.  Otherwise fully functional, independent, would like to return to work if possible.  Medications: Current Outpatient Medications on File Prior to Visit  Medication Sig Dispense Refill   amLODipine (NORVASC) 10 MG tablet TAKE 1 TABLET BY MOUTH EVERY DAY 90 tablet 1   dexamethasone (DECADRON) 1 MG tablet Take 1 tablet (1 mg total) by mouth daily. 60 tablet 1   gabapentin (NEURONTIN) 100 MG capsule Take 1 capsule (100 mg total) by mouth 2 (two) times daily. 60 capsule 3   lacosamide (VIMPAT) 50 MG TABS tablet TAKE 2 TABLETS (100 MG TOTAL) BY MOUTH EVERY MORNING AND 1 TABLET (50 MG TOTAL) AT BEDTIME. 270 tablet 2   levETIRAcetam (KEPPRA) 750 MG tablet Take 2 tablets (1,500 mg total) by mouth 2 (two) times daily. 120 tablet 2   levothyroxine (SYNTHROID) 100 MCG tablet Take 1 tablet (100 mcg total) by mouth daily. 90 tablet 3   LORazepam (ATIVAN) 2 MG tablet Take 1 tablet (2 mg total) by mouth every 8 (eight) hours as needed for seizure. 12 tablet 0   ondansetron (ZOFRAN-ODT) 4 MG disintegrating tablet Take 1 tablet (4 mg total) by mouth every  8 (eight) hours as needed for nausea or vomiting. 10 tablet 0   Potassium Chloride ER 20 MEQ TBCR TAKE 1 TABLET BY MOUTH EVERY DAY 90 tablet 3   prochlorperazine (COMPAZINE) 10 MG tablet Take 10 mg by mouth every 6 (six) hours as needed for nausea or vomiting.     No current facility-administered medications on file prior to visit.    Allergies: No Known Allergies Past Medical  History:  Past Medical History:  Diagnosis Date   Bell's palsy    left side of face- notied in smile and eyes, if very tired   Brain cancer ([facility])    Complication of anesthesia    N/V   Essential hypertension    External hemorrhoids    History of blood transfusion    post op Hemorroids   History of pneumonia    Hyperlipidemia    Hyperthyroidism    hx 57 years old- oral meds   Iron deficiency anemia    Migraine headache    Pneumonia    x 2 - years ago   PONV (postoperative nausea and vomiting)    PPD positive    Pulmonary nodules    not seen on plain cxr, first detected 1/07 re ct 10/08 pos IPPD > 15 mm 12/09 FOB/lavage 04/20/08 neg afb smear   PVC's (premature ventricular contractions)    Seizure ([facility]) 11/15/2020   Sleep apnea    refused CPAP- mouth piece recommended   Tuberculosis    in her late 30's   Past Surgical History:  Past Surgical History:  Procedure Laterality Date   ABDOMINAL HYSTERECTOMY     APPLICATION OF CRANIAL NAVIGATION N/A 11/21/2020   Procedure: APPLICATION OF CRANIAL NAVIGATION;  Surgeon: Judith Part, MD;  Location: [facility];  Service: Neurosurgery;  Laterality: N/A;   BREAST BIOPSY Right 2014   BREAST BIOPSY Left 10/29/2019   CHOLECYSTECTOMY  2014   COLONOSCOPY  05/04/2009   prolapsing external hemorrhoids   CRANIOTOMY Left 11/21/2020   Procedure: Left craniotomy for tumor resection;  Surgeon: Judith Part, MD;  Location: [facility];  Service: Neurosurgery;  Laterality: Left;   ENDOMETRIAL ABLATION     HEMORRHOID SURGERY     x 3   TUBAL LIGATION     Social History:  Social History   Socioeconomic History   Marital status: Divorced    Spouse name: Not on file   Number of children: 2   Years of education: Not on file   Highest education level: Associate degree: occupational, Hotel manager, or vocational program  Occupational History   Occupation: accounts Sport and exercise psychologist: [employer]  Tobacco Use   Smoking status: Never   Smokeless  tobacco: Never  Vaping Use   Vaping Use: Never used  Substance and Sexual Activity   Alcohol use: No    Alcohol/week: 0.0 standard drinks of alcohol   Drug use: No   Sexual activity: Not on file  Other Topics Concern   Not on file  Social History Narrative   Divorced with 2 children   She works as a Librarian, 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Press photographer    Possible TB exposure 2008 (after nodules found)   From [place] lived there and [place]   Never owned birds      Social Determinants of [place] Strain: [place] (06/13/2021)   Overall Financial Resource Strain (CARDIA)    Difficulty of Paying Living Expenses: Somewhat hard  Food Insecurity: Food Insecurity Present (04/23/2021)   Hunger  Vital Sign    Worried About Charity fundraiser in the Last Year: Sometimes true    Ran Out of Food in the Last Year: Never true  Transportation Needs: No Transportation Needs (06/13/2021)   PRAPARE - Hydrologist (Medical): No    Lack of Transportation (Non-Medical): No  Physical Activity: Unknown (04/23/2021)   Exercise Vital Sign    Days of Exercise per Week: 0 days    Minutes of Exercise per Session: Not on file  Stress: No Stress Concern Present (04/23/2021)   [place]    Feeling of Stress : Only a little  Social Connections: Moderately Integrated (04/23/2021)   Social Connection and Isolation Panel [NHANES]    Frequency of Communication with Friends and Family: Twice a week    Frequency of Social Gatherings with Friends and Family: Twice a week    Attends Religious Services: More than 4 times per year    Active Member of [employer] or Organizations: Yes    Attends Music therapist: More than 4 times per year    Marital Status: Divorced  Human resources officer Violence: Not on file   Family History:  Family History  Problem Relation Age of Onset   Diabetes type II Mother    Kidney failure Sister     Diabetes Mellitus II Sister    Colon cancer Maternal Aunt        aunts and uncles   Diabetes Mellitus II Maternal Aunt    Pancreatic cancer Maternal Aunt    Colon cancer Maternal Uncle    Diabetes Mellitus II Maternal Uncle    Heart disease Maternal Grandmother        aunts, uncles, sister   Diabetes Mellitus II Maternal Grandmother    Lung cancer Other        aunts and uncles    Review of Systems: Constitutional: poor appetite Eyes: blurriness of vision Ears, nose, mouth, throat, and face: Doesn't report sore throat Respiratory: Doesn't report cough, dyspnea or wheezes Cardiovascular: Doesn't report palpitation, chest discomfort  Gastrointestinal:  Doesn't report nausea, constipation, diarrhea GU: Doesn't report incontinence Skin: Doesn't report skin rashes Neurological: Per HPI Musculoskeletal: bilateral shoulder pain Behavioral/Psych: +anxiety  Physical Exam: Vitals:   06/13/22 0932  BP: (!) 150/97  Pulse: (!) 117  Resp: 17  Temp: 98 F (36.7 C)  SpO2: 99%    KPS: 70. General: Alert, cooperative, pleasant, in no acute distress Head: Normal EENT: No conjunctival injection or scleral icterus.  Lungs: Resp effort normal Cardiac: Regular rate Abdomen: Non-distended abdomen Skin: No rashes cyanosis or petechiae. Extremities: No clubbing or edema  Neurologic Exam: Mental Status: Awake, alert, attentive to examiner. Oriented to self and environment. Language is notable for modest impairment in fluency.  Impaired short term recall. Cranial Nerves: Visual acuity is grossly normal. Visual fields are full. Extra-ocular movements intact. No ptosis. Face L LMN paresis, chronic. Motor: Tone and bulk are normal. Power is full in both arms and legs. Reflexes are symmetric, no pathologic reflexes present.  Sensory: Intact to light touch Gait: Normal.   Labs: I have reviewed the data as listed    Component Value Date/Time   NA 138 06/06/2022 0954   K 3.7 06/06/2022 0954    CL 105 06/06/2022 0954   CO2 28 06/06/2022 0954   GLUCOSE 110 (H) 06/06/2022 0954   BUN 16 06/06/2022 0954   CREATININE 0.74 06/06/2022 0954  CREATININE 0.92 01/25/2020 1424   CALCIUM 9.8 06/06/2022 0954   PROT 7.0 06/06/2022 0954   ALBUMIN 4.1 06/06/2022 0954   AST 12 (L) 06/06/2022 0954   ALT 17 06/06/2022 0954   ALKPHOS 127 (H) 06/06/2022 0954   BILITOT 0.6 06/06/2022 0954   GFRNONAA >60 06/06/2022 0954   GFRNONAA 72 01/25/2020 1424   GFRAA 83 01/25/2020 1424   Lab Results  Component Value Date   WBC 4.0 06/13/2022   NEUTROABS 2.4 06/13/2022   HGB 13.0 06/13/2022   HCT 40.5 06/13/2022   MCV 95.7 06/13/2022   PLT 174 06/13/2022    Imaging:  [facility] Clinician Interpretation: I have personally reviewed the CNS images as listed.  My interpretation, in the context of the patient's clinical presentation, is treatment effect vs true progression  MR BRAIN W WO CONTRAST  Result Date: 05/30/2022 CLINICAL DATA:  Brain/CNS neoplasm, assess treatment response. Glioblastoma. EXAM: MRI HEAD WITHOUT AND WITH CONTRAST TECHNIQUE: Multiplanar, multiecho pulse sequences of the brain and surrounding structures were obtained without and with intravenous contrast. CONTRAST:  53m GADAVIST GADOBUTROL 1 MMOL/ML IV SOLN COMPARISON:  Head MRI 03/14/2022 FINDINGS: Brain: Postsurgical changes are again seen with encephalomalacia anteriorly in the left temporal lobe. There is a new 4 mm focus of restricted diffusion within the white matter along the medial aspect of the atrium of the left lateral ventricle with associated mild T2 hyperintensity and no appreciable enhancement. Multiple other foci of restricted diffusion in the left parietal/periatrial white matter more posteriorly, laterally, and inferiorly as well as a confluent region of restricted diffusion in the left temporal stem are unchanged from the prior MRI. These again demonstrate varying degrees of intrinsic T1 hyperintensity and at most faint  enhancement. A 3 mm focus of restricted diffusion in the left globus pallidus is also new with faint T2 hyperintensity and no enhancement. Confluent T2 hyperintensity in the white matter of the left temporal lobe extending into the occipital, parietal, and frontal lobes as well as the deep white matter tracks at the level of the basal ganglia is unchanged from the prior MRI. There is no significant associated mass effect. A few scattered foci of chronic cerebral microhemorrhage are noted elsewhere. No acute large territory infarct, midline shift, or extra-axial fluid collection is evident. There is mild ex vacuo dilatation of the left lateral ventricle. Vascular: Major intracranial vascular flow voids are preserved. Skull and upper cervical spine: Left pterional craniotomy. No suspicious marrow lesion. Sinuses/Orbits: Chronic bilateral proptosis. Mild mucosal thickening in the paranasal sinuses. Mild enlargement of a mucous retention cyst in the right maxillary sinus. Trace right mastoid fluid. Other: None. IMPRESSION: 1. Two new punctate foci of restricted diffusion in the left periatrial white matter and left globus pallidus without enhancement, indeterminate for tumor versus treatment effects or incidental small vessel infarcts. 2. Otherwise unchanged appearance of left cerebral hemispheric tumor and post treatment changes. Electronically Signed   By: ALogan BoresM.D.   On: 05/30/2022 12:05     Assessment/Plan 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[facility] [C71.9]  Vanice MSWEDEN LESUREis clinically stable today.  Fortunately no further breakthrough seizures.  Will recommend to proceed with both irinotecan '90mg'$ /m2 and avastin '10mg'$ /m2 today.  Chemotherapy should be held for the following:  ANC less than 1,000  Platelets less than 100,000  LFT or creatinine greater than 2x ULN  If clinical concerns/contraindications develop  Avastin should be held for the following:  ANC less than  500  Platelets less than  50,000  LFT or creatinine greater than 2x ULN  If clinical concerns/contraindications develop  For seizures, will con't Vimpat 100/100.  Keppra will con't '1500mg'$  BID.  Decadron will con't '1mg'$  daily.  May resume gabapentin '100mg'$  BID for neuropathic head/neck pain, potentially occipital neuralgia today.  We ask that BRILYN TULLER return to clinic in 3 weeks prior to next scheduled infusion of CPT-11, Avastin and labs.  Next MRI in 9 weeks.  All questions were answered. The patient knows to call the clinic with any problems, questions or concerns. No barriers to learning were detected.  The total time spent in the encounter was 30 minutes and more than 50% was on counseling and review of test results   Ventura Sellers, MD Medical Director of Neuro-Oncology [facility] at [place] 06/13/22 9:44 AM

## 2022-06-14 ENCOUNTER — Inpatient Hospital Stay: Payer: BC Managed Care – PPO

## 2022-06-14 ENCOUNTER — Telehealth: Payer: Self-pay | Admitting: Internal Medicine

## 2022-06-14 ENCOUNTER — Telehealth: Payer: Self-pay

## 2022-06-14 NOTE — Telephone Encounter (Signed)
This nurse received a call from this patient who states that provider sent in a refill on her Lacosamide but her local pharmacy was out and had to [facility] and she will not have them until tomorrow.  She wants the provider to be aware that she had to miss her doses today.  She states that she does have her Keppra and she will take that as prescribed.  This nurse advised that the provider will be made aware of what happened.  Patient has no further questions or concerns noted at this time.  Prescriber made aware.

## 2022-06-14 NOTE — Telephone Encounter (Signed)
Scheduled per 02/08 los and work-queue, patient has been called and notified.

## 2022-06-19 ENCOUNTER — Encounter: Payer: Self-pay | Admitting: Internal Medicine

## 2022-06-26 ENCOUNTER — Other Ambulatory Visit: Payer: Self-pay

## 2022-06-27 ENCOUNTER — Other Ambulatory Visit: Payer: BC Managed Care – PPO

## 2022-06-27 ENCOUNTER — Ambulatory Visit: Payer: BC Managed Care – PPO

## 2022-06-27 ENCOUNTER — Ambulatory Visit: Payer: BC Managed Care – PPO | Admitting: Internal Medicine

## 2022-06-28 ENCOUNTER — Other Ambulatory Visit: Payer: Self-pay | Admitting: Internal Medicine

## 2022-06-28 DIAGNOSIS — I10 Essential (primary) hypertension: Secondary | ICD-10-CM

## 2022-07-03 MED FILL — Dexamethasone Sodium Phosphate Inj 100 MG/10ML: INTRAMUSCULAR | Qty: 1 | Status: AC

## 2022-07-04 ENCOUNTER — Encounter: Payer: Self-pay | Admitting: Nurse Practitioner

## 2022-07-04 ENCOUNTER — Inpatient Hospital Stay (HOSPITAL_BASED_OUTPATIENT_CLINIC_OR_DEPARTMENT_OTHER): Payer: BC Managed Care – PPO | Admitting: Nurse Practitioner

## 2022-07-04 ENCOUNTER — Inpatient Hospital Stay: Payer: BC Managed Care – PPO

## 2022-07-04 ENCOUNTER — Inpatient Hospital Stay (HOSPITAL_BASED_OUTPATIENT_CLINIC_OR_DEPARTMENT_OTHER): Payer: BC Managed Care – PPO | Admitting: Internal Medicine

## 2022-07-04 ENCOUNTER — Other Ambulatory Visit: Payer: Self-pay

## 2022-07-04 ENCOUNTER — Ambulatory Visit (HOSPITAL_COMMUNITY)
Admission: RE | Admit: 2022-07-04 | Discharge: 2022-07-04 | Disposition: A | Discharge status: Home / Self Care | Payer: BC Managed Care – PPO | Source: Ambulatory Visit | Attending: Internal Medicine | Admitting: Internal Medicine

## 2022-07-04 VITALS — BP 155/88 | HR 86 | Temp 97.5°F | Resp 20 | Wt 169.9 lb

## 2022-07-04 VITALS — BP 157/82 | HR 61 | Resp 18

## 2022-07-04 DIAGNOSIS — C719 Malignant neoplasm of brain, unspecified: Secondary | ICD-10-CM | POA: Insufficient documentation

## 2022-07-04 DIAGNOSIS — Z7189 Other specified counseling: Secondary | ICD-10-CM

## 2022-07-04 DIAGNOSIS — R530 Neoplastic (malignant) related fatigue: Secondary | ICD-10-CM | POA: Diagnosis not present

## 2022-07-04 DIAGNOSIS — G9389 Other specified disorders of brain: Secondary | ICD-10-CM | POA: Diagnosis not present

## 2022-07-04 DIAGNOSIS — R569 Unspecified convulsions: Secondary | ICD-10-CM | POA: Diagnosis not present

## 2022-07-04 DIAGNOSIS — Z5111 Encounter for antineoplastic chemotherapy: Secondary | ICD-10-CM | POA: Diagnosis not present

## 2022-07-04 DIAGNOSIS — Z515 Encounter for palliative care: Secondary | ICD-10-CM | POA: Diagnosis not present

## 2022-07-04 DIAGNOSIS — Z79899 Other long term (current) drug therapy: Secondary | ICD-10-CM | POA: Diagnosis not present

## 2022-07-04 DIAGNOSIS — C712 Malignant neoplasm of temporal lobe: Secondary | ICD-10-CM | POA: Diagnosis not present

## 2022-07-04 DIAGNOSIS — I10 Essential (primary) hypertension: Secondary | ICD-10-CM | POA: Diagnosis not present

## 2022-07-04 LAB — CMP (CANCER CENTER ONLY)
ALT: 21 U/L (ref 0–44)
AST: 14 U/L — ABNORMAL LOW (ref 15–41)
Albumin: 4.5 g/dL (ref 3.5–5.0)
Alkaline Phosphatase: 136 U/L — ABNORMAL HIGH (ref 38–126)
Anion gap: 9 (ref 5–15)
BUN: 11 mg/dL (ref 6–20)
CO2: 28 mmol/L (ref 22–32)
Calcium: 9.5 mg/dL (ref 8.9–10.3)
Chloride: 103 mmol/L (ref 98–111)
Creatinine: 0.73 mg/dL (ref 0.44–1.00)
GFR, Estimated: >60 mL/min (ref >60)
Glucose, Bld: 126 mg/dL — ABNORMAL HIGH (ref 70–99)
Potassium: 3.6 mmol/L (ref 3.5–5.1)
Sodium: 140 mmol/L (ref 135–145)
Total Bilirubin: 0.6 mg/dL (ref 0.3–1.2)
Total Protein: 7 g/dL (ref 6.5–8.1)

## 2022-07-04 LAB — CBC WITH DIFFERENTIAL (CANCER CENTER ONLY)
Abs Immature Granulocytes: 0.04 10*3/uL (ref 0.00–0.07)
Basophils Absolute: 0 10*3/uL (ref 0.0–0.1)
Basophils Relative: 0 %
Eosinophils Absolute: 0 10*3/uL (ref 0.0–0.5)
Eosinophils Relative: 1 %
HCT: 43.7 % (ref 36.0–46.0)
Hemoglobin: 14.2 g/dL (ref 12.0–15.0)
Immature Granulocytes: 1 %
Lymphocytes Relative: 15 %
Lymphs Abs: 0.7 10*3/uL (ref 0.7–4.0)
MCH: 30.8 pg (ref 26.0–34.0)
MCHC: 32.5 g/dL (ref 30.0–36.0)
MCV: 94.8 fL (ref 80.0–100.0)
Monocytes Absolute: 0.4 10*3/uL (ref 0.1–1.0)
Monocytes Relative: 8 %
Neutro Abs: 3.5 10*3/uL (ref 1.7–7.7)
Neutrophils Relative %: 75 %
Platelet Count: 192 10*3/uL (ref 150–400)
RBC: 4.61 MIL/uL (ref 3.87–5.11)
RDW: 16.3 % — ABNORMAL HIGH (ref 11.5–15.5)
WBC Count: 4.7 10*3/uL (ref 4.0–10.5)
nRBC: 1.1 % — ABNORMAL HIGH (ref 0.0–0.2)

## 2022-07-04 LAB — TOTAL PROTEIN, URINE DIPSTICK: Protein, ur: 30 mg/dL — AB

## 2022-07-04 MED ORDER — GADOBUTROL 1 MMOL/ML IV SOLN
7.0000 mL | Freq: Once | INTRAVENOUS | Status: AC | PRN
  Administered 2022-07-04: 7 mL via INTRAVENOUS

## 2022-07-04 MED ORDER — ATROPINE SULFATE 1 MG/ML IV SOLN
0.5000 mg | Freq: Once | INTRAVENOUS | Status: AC | PRN
  Administered 2022-07-04: 0.5 mg via INTRAVENOUS
  Filled 2022-07-04: qty 1

## 2022-07-04 MED ORDER — SODIUM CHLORIDE 0.9 % IV SOLN
10.0000 mg/kg | Freq: Once | INTRAVENOUS | Status: AC
  Administered 2022-07-04: 700 mg via INTRAVENOUS
  Filled 2022-07-04: qty 12

## 2022-07-04 MED ORDER — SODIUM CHLORIDE 0.9 % IV SOLN: Freq: Once | INTRAVENOUS | Status: AC

## 2022-07-04 MED ORDER — PALONOSETRON HCL INJECTION 0.25 MG/5ML
0.2500 mg | Freq: Once | INTRAVENOUS | Status: AC
  Administered 2022-07-04: 0.25 mg via INTRAVENOUS
  Filled 2022-07-04: qty 5

## 2022-07-04 MED ORDER — IRINOTECAN HCL CHEMO INJECTION 100 MG/5ML
125.0000 mg/m2 | Freq: Once | INTRAVENOUS | Status: AC
  Administered 2022-07-04: 240 mg via INTRAVENOUS
  Filled 2022-07-04: qty 12

## 2022-07-04 MED ORDER — SODIUM CHLORIDE 0.9 % IV SOLN
10.0000 mg | Freq: Once | INTRAVENOUS | Status: AC
  Administered 2022-07-04: 10 mg via INTRAVENOUS
  Filled 2022-07-04: qty 10

## 2022-07-04 NOTE — Progress Notes (Signed)
[facility]  [phone] [phone]) [phone]   Name: [name] Date: 07/04/2022 MRN: [id number]  DOB: [birth year]  Patient Care Team: [name], NP as PCP - General (Family Medicine) Jacki Cones, MD as Referring Physician (Gastroenterology) Truitt Merle, MD as Consulting Physician (Oncology) Mickeal Skinner Acey Lav, MD as Consulting Physician (Oncology)    INTERVAL HISTORY: [name] is a 57 y.o. female with medical history of glioblastoma s/p resection (11/2020) IMRT, currently on dexamethasone and Temodar, pancreatic lesion, hypertension, and hyperlipidemia.  Palliative ask to see for symptom management and goals of care.   SOCIAL HISTORY:     reports that she has never smoked. She has never used smokeless tobacco. She reports that she does not drink alcohol and does not use drugs.  ADVANCE DIRECTIVES:  None on file  CODE STATUS: Full code  PAST MEDICAL HISTORY: Past Medical History:  Diagnosis Date   Bell's palsy    left side of face- notied in smile and eyes, if very tired   Brain cancer ([facility])    Complication of anesthesia    N/V   Essential hypertension    External hemorrhoids    History of blood transfusion    post op Hemorroids   History of pneumonia    Hyperlipidemia    Hyperthyroidism    hx 57 years old- oral meds   Iron deficiency anemia    Migraine headache    Pneumonia    x 2 - years ago   PONV (postoperative nausea and vomiting)    PPD positive    Pulmonary nodules    not seen on plain cxr, first detected 1/07 re ct 10/08 pos IPPD > 15 mm 12/09 FOB/lavage 04/20/08 neg afb smear   PVC's (premature ventricular contractions)    Seizure ([facility]) 11/15/2020   Sleep apnea    refused CPAP- mouth piece recommended   Tuberculosis    in her late 30's    ALLERGIES:  has No Known Allergies.  MEDICATIONS:  Current Outpatient Medications  Medication Sig Dispense Refill   amLODipine (NORVASC) 10  MG tablet TAKE 1 TABLET BY MOUTH EVERY DAY 90 tablet 1   gabapentin (NEURONTIN) 100 MG capsule Take 1 capsule (100 mg total) by mouth 2 (two) times daily. 60 capsule 3   lacosamide 100 MG TABS Take 1 tablet (100 mg total) by mouth 2 (two) times daily. 60 tablet 2   levETIRAcetam (KEPPRA) 750 MG tablet Take 2 tablets (1,500 mg total) by mouth 2 (two) times daily. 120 tablet 2   levothyroxine (SYNTHROID) 100 MCG tablet Take 1 tablet (100 mcg total) by mouth daily. 90 tablet 3   LORazepam (ATIVAN) 2 MG tablet Take 1 tablet (2 mg total) by mouth every 8 (eight) hours as needed for seizure. 12 tablet 0   ondansetron (ZOFRAN-ODT) 4 MG disintegrating tablet Take 1 tablet (4 mg total) by mouth every 8 (eight) hours as needed for nausea or vomiting. 10 tablet 0   Potassium Chloride ER 20 MEQ TBCR TAKE 1 TABLET BY MOUTH EVERY DAY 90 tablet 3   prochlorperazine (COMPAZINE) 10 MG tablet Take 10 mg by mouth every 6 (six) hours as needed for nausea or vomiting.     No current facility-administered medications for this visit.    VITAL SIGNS: There were no vitals taken for this visit. There were no vitals filed for this visit.  Estimated body mass index is 26.17 kg/m as calculated from the following:  Height as of 05/29/22: '5\' 7"'$  (1.702 m).   Weight as of 06/13/22: 167 lb 1.6 oz (75.8 kg).   PERFORMANCE STATUS (ECOG) : 1 - Symptomatic but completely ambulatory   Physical Exam General: NAD Head: Scarring from previous surgeries, left side of face swollen, left temple area slightly sunken, facial droop Neurological: alert and oriented x 4, appropriate affect Cardiovascular: regular rate and rhythm Pulmonary: clear ant fields Abdomen: soft, nontender, + bowel sounds Extremities: no joint deformities Skin: no rashes   IMPRESSION: Present for visit are patient and her brother visiting from [place]. [name] is alert, oriented, and engaging. She reports that she has been doing well overall with the  current regimen.  Pain: [name] endorses occasional, sharp pains to the left side of her head near the temple area. States that, sometimes, the pain wraps around the crown of her head. Left side of face is slightly swollen. The left temple area, near site of previous surgery is sunken.   Pain episodes are infrequent and patient prefers to "try to relax" to get through them. She gently rubs her left jaw and temple or lies down for a nap until the pain passes.  Seizure Management: [name] takes her regularly scheduled Vimpat and Keppra for management of seizures. She keeps Ativan in her purse to take if she feels strange or like a seizure might be coming on.   Appetite: [name] reports eating very well and gaining weight. She laughs about the odd cravings that she has, her most recent cravings have been for ice cream, green vegetables, and snickers candy bars. Denies any recent nausea or vomiting. Keeps Zofran and Omeprazole in her purse "just in case" she were to experience symptoms. Medications packages examined and were last filled back in August.  Constipation: [name] reports a bowel pattern that is "up and down." States that when she gets to 3 days without a bowel movement, she takes OTC Senokot with good results.   Fatigue:  Patient reports that energy level is good most days. Reports trying to continue activities that are meaningful such as taking walks and running with her nieces. Does experience fatigue and some dyspnea with those activities. Denies any palpitations.  Goals of Care: No changes were made to patient's regimen at this time. Time was spent building rapport and encouraging patient in the many healthy habits that she has in place. She verbalizes understanding to call Palliative team with any questions/needs/concerns.  We discussed [name]'s current illness and what it means in the larger context of Her on-going co-morbidities. Natural disease trajectory and  expectations were discussed. We discussed the importance of continued conversation with family and their medical providers regarding overall plan of care and treatment options, ensuring decisions are within the context of the patients values and GOCs.  PLAN: Ongoing goals of care discussion. Spent time establishing therapeutic relationship.  No changes to medication regimen at this time.  Will continue to monitor closely and adjust regimen as needed. Follow-up in 2-3 weeks coordinated with upcoming oncology appointments.  Patient expressed understanding and was in agreement with this plan. She also understands that She can call the clinic at any time with any questions, concerns, or complaints.   Time Total: 40 min   Visit consisted of counseling and education dealing with the complex and emotionally intense issues of symptom management and palliative care in the setting of serious and potentially life-threatening illness.Greater than 50%  of this time was spent counseling and  coordinating care related to the above assessment and plan.   Signed by: Moss Mc, RN MSN [facility] / NP Student  Alda Lea, AGPCNP-BC  Palliative Medicine Team/[facility]

## 2022-07-04 NOTE — Progress Notes (Signed)
[facility] [place], [place] [address] [phone]   Interval Evaluation  Date of Service: 07/04/22 Patient Name: [name] Patient MRN: [id number] Patient DOB: February 26, 1966 Provider: Ventura Sellers, MD  Identifying Statement:  [name] is a 57 y.o. female with left temporal glioblastoma   Oncologic History: Oncology History  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[place])  11/21/2020 Initial Diagnosis   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[place])   11/21/2020 Cancer Staging   Staging form: Brain and Spinal Cord, AJCC 8th Edition - Pathologic stage from 11/21/2020: WHO Grade IV - Signed by Ventura Sellers, MD on 11/30/2020 Histopathologic type: Glioblastoma Stage prefix: Initial diagnosis Histologic grading system: 4 grade system Extent of surgical resection: Subtotal resection Karnofsky performance status: Score 90 Seizures at presentation: Present Duration of symptoms before diagnosis: Short IDH1 mutation: Negative   12/27/2020 - 02/07/2021 Radiation Therapy   IMRT and concurrent Temodar [name])   03/12/2021 - 08/26/2021 Chemotherapy   Completes 5 cycles of adjuvant 5-day Temodar   08/27/2021 Progression   POD #1   09/06/2021 -  Chemotherapy   Initiates second line therapy; CCNU '90mg'$ /m2 PO q6 weeks and avastin '10mg'$ /kg IV q2 weeks   12/28/2021 Progression   POD #2   01/17/2022 -  Chemotherapy   Third line therapy with Irinotecan and Avastin q2 weeks     Biomarkers:  MGMT Methylated.  IDH 1/2 Wild type.  EGFR Unknown  TERT Unknown   Interval History: [name] presents today after recent MRI brain.  No seizures over past month.  No new or progressive neurologic complaints today.  No longer dosing decadron.  Short term memory impairment and fatigue are still persistent.  Does have fluctuations in language expression.    H+P (11/25/20) Patient presented to  medical attention this past month with first ever seizure.  She describes episode of sudden onset "disconnection" with tongue biting and urination, followed by period of confusion.  CNS imaging demonstrated enhancing mass within left anterior temporal lobe, which was mostly resected by Dr. Zada Finders on 11/21/20.  Since surgery she had not had recurrence of seizures.  She complains of painful cramping of her right leg, which started ~1 week prior.  At this time the cramping is her main complaint.  Otherwise fully functional, independent, would like to return to work if possible.  Medications: Current Outpatient Medications on File Prior to Visit  Medication Sig Dispense Refill   amLODipine (NORVASC) 10 MG tablet TAKE 1 TABLET BY MOUTH EVERY DAY 90 tablet 1   dexamethasone (DECADRON) 1 MG tablet Take 1 mg by mouth 2 (two) times daily.     gabapentin (NEURONTIN) 100 MG capsule Take 1 capsule (100 mg total) by mouth 2 (two) times daily. 60 capsule 3   lacosamide 100 MG TABS Take 1 tablet (100 mg total) by mouth 2 (two) times daily. 60 tablet 2   levETIRAcetam (KEPPRA) 750 MG tablet Take 2 tablets (1,500 mg total) by mouth 2 (two) times daily. 120 tablet 2   levothyroxine (SYNTHROID) 100 MCG tablet Take 1 tablet (100 mcg total) by mouth daily. 90 tablet 3   LORazepam (ATIVAN) 2 MG tablet Take 1 tablet (2 mg total) by mouth every 8 (eight) hours as needed for seizure. 12 tablet 0   ondansetron (ZOFRAN-ODT) 4 MG disintegrating tablet Take 1 tablet (4 mg total) by mouth every 8 (eight) hours as needed for nausea or vomiting. 10  tablet 0   Potassium Chloride ER 20 MEQ TBCR TAKE 1 TABLET BY MOUTH EVERY DAY 90 tablet 3   prochlorperazine (COMPAZINE) 10 MG tablet Take 10 mg by mouth every 6 (six) hours as needed for nausea or vomiting.     No current facility-administered medications on file prior to visit.    Allergies: No Known Allergies Past Medical History:  Past Medical History:  Diagnosis Date    Bell's palsy    left side of face- notied in smile and eyes, if very tired   Brain cancer ([facility])    Complication of anesthesia    N/V   Essential hypertension    External hemorrhoids    History of blood transfusion    post op Hemorroids   History of pneumonia    Hyperlipidemia    Hyperthyroidism    hx 57 years old- oral meds   Iron deficiency anemia    Migraine headache    Pneumonia    x 2 - years ago   PONV (postoperative nausea and vomiting)    PPD positive    Pulmonary nodules    not seen on plain cxr, first detected 1/07 re ct 10/08 pos IPPD > 15 mm 12/09 FOB/lavage 04/20/08 neg afb smear   PVC's (premature ventricular contractions)    Seizure ([facility]) 11/15/2020   Sleep apnea    refused CPAP- mouth piece recommended   Tuberculosis    in her late 30's   Past Surgical History:  Past Surgical History:  Procedure Laterality Date   ABDOMINAL HYSTERECTOMY     APPLICATION OF CRANIAL NAVIGATION N/A 11/21/2020   Procedure: APPLICATION OF CRANIAL NAVIGATION;  Surgeon: Judith Part, MD;  Location: [facility];  Service: Neurosurgery;  Laterality: N/A;   BREAST BIOPSY Right 2014   BREAST BIOPSY Left 10/29/2019   CHOLECYSTECTOMY  2014   COLONOSCOPY  05/04/2009   prolapsing external hemorrhoids   CRANIOTOMY Left 11/21/2020   Procedure: Left craniotomy for tumor resection;  Surgeon: Judith Part, MD;  Location: [facility];  Service: Neurosurgery;  Laterality: Left;   ENDOMETRIAL ABLATION     HEMORRHOID SURGERY     x 3   TUBAL LIGATION     Social History:  Social History   Socioeconomic History   Marital status: Divorced    Spouse name: Not on file   Number of children: 2   Years of education: Not on file   Highest education level: Associate degree: occupational, Hotel manager, or vocational program  Occupational History   Occupation: accounts Sport and exercise psychologist: [employer]  Tobacco Use   Smoking status: Never   Smokeless tobacco: Never  Vaping Use   Vaping Use: Never  used  Substance and Sexual Activity   Alcohol use: No    Alcohol/week: 0.0 standard drinks of alcohol   Drug use: No   Sexual activity: Not on file  Other Topics Concern   Not on file  Social History Narrative   Divorced with 2 children   She works as a Librarian, 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Press photographer    Possible TB exposure 2008 (after nodules found)   From [place] lived there and [place]   Never owned birds      Social Determinants of [place] Strain: [place] (06/13/2021)   Overall Financial Resource Strain (CARDIA)    Difficulty of Paying Living Expenses: Somewhat hard  Food Insecurity: Food Insecurity Present (04/23/2021)   Hunger Vital Sign    Worried About Running Out of  Food in the Last Year: Sometimes true    Ran Out of Food in the Last Year: Never true  Transportation Needs: No Transportation Needs (06/13/2021)   PRAPARE - Hydrologist (Medical): No    Lack of Transportation (Non-Medical): No  Physical Activity: Unknown (04/23/2021)   Exercise Vital Sign    Days of Exercise per Week: 0 days    Minutes of Exercise per Session: Not on file  Stress: No Stress Concern Present (04/23/2021)   [place]    Feeling of Stress : Only a little  Social Connections: Moderately Integrated (04/23/2021)   Social Connection and Isolation Panel [NHANES]    Frequency of Communication with Friends and Family: Twice a week    Frequency of Social Gatherings with Friends and Family: Twice a week    Attends Religious Services: More than 4 times per year    Active Member of [employer] or Organizations: Yes    Attends Music therapist: More than 4 times per year    Marital Status: Divorced  Human resources officer Violence: Not on file   Family History:  Family History  Problem Relation Age of Onset   Diabetes type II Mother    Kidney failure Sister    Diabetes Mellitus II Sister    Colon cancer  Maternal Aunt        aunts and uncles   Diabetes Mellitus II Maternal Aunt    Pancreatic cancer Maternal Aunt    Colon cancer Maternal Uncle    Diabetes Mellitus II Maternal Uncle    Heart disease Maternal Grandmother        aunts, uncles, sister   Diabetes Mellitus II Maternal Grandmother    Lung cancer Other        aunts and uncles    Review of Systems: Constitutional: poor appetite Eyes: blurriness of vision Ears, nose, mouth, throat, and face: Doesn't report sore throat Respiratory: Doesn't report cough, dyspnea or wheezes Cardiovascular: Doesn't report palpitation, chest discomfort  Gastrointestinal:  Doesn't report nausea, constipation, diarrhea GU: Doesn't report incontinence Skin: Doesn't report skin rashes Neurological: Per HPI Musculoskeletal: bilateral shoulder pain Behavioral/Psych: +anxiety  Physical Exam: Vitals:   07/04/22 1141  BP: (!) 155/88  Pulse: 86  Resp: 20  Temp: (!) 97.5 F (36.4 C)  SpO2: 100%    KPS: 70. General: Alert, cooperative, pleasant, in no acute distress Head: Normal EENT: No conjunctival injection or scleral icterus.  Lungs: Resp effort normal Cardiac: Regular rate Abdomen: Non-distended abdomen Skin: No rashes cyanosis or petechiae. Extremities: No clubbing or edema  Neurologic Exam: Mental Status: Awake, alert, attentive to examiner. Oriented to self and environment. Language is notable for modest impairment in fluency.  Impaired short term recall. Cranial Nerves: Visual acuity is grossly normal. Visual fields are full. Extra-ocular movements intact. No ptosis. Face L LMN paresis, chronic. Motor: Tone and bulk are normal. Power is full in both arms and legs. Reflexes are symmetric, no pathologic reflexes present.  Sensory: Intact to light touch Gait: Normal.   Labs: I have reviewed the data as listed    Component Value Date/Time   NA 140 07/04/2022 1108   K 3.6 07/04/2022 1108   CL 103 07/04/2022 1108   CO2 28  07/04/2022 1108   GLUCOSE 126 (H) 07/04/2022 1108   BUN 11 07/04/2022 1108   CREATININE 0.73 07/04/2022 1108   CREATININE 0.92 01/25/2020 1424   CALCIUM 9.5 07/04/2022 1108  PROT 7.0 07/04/2022 1108   ALBUMIN 4.5 07/04/2022 1108   AST 14 (L) 07/04/2022 1108   ALT 21 07/04/2022 1108   ALKPHOS 136 (H) 07/04/2022 1108   BILITOT 0.6 07/04/2022 1108   GFRNONAA >60 07/04/2022 1108   GFRNONAA 72 01/25/2020 1424   GFRAA 83 01/25/2020 1424   Lab Results  Component Value Date   WBC 4.7 07/04/2022   NEUTROABS 3.5 07/04/2022   HGB 14.2 07/04/2022   HCT 43.7 07/04/2022   MCV 94.8 07/04/2022   PLT 192 07/04/2022    Imaging:  [place] Clinician Interpretation: I have personally reviewed the CNS images as listed.  My interpretation, in the context of the patient's clinical presentation, is treatment effect vs true progression  No results found.   Assessment/Plan Focal seizures ([place]) [R56.9]  [name] is clinically stable today.  MRI brain demonstrates stable findings overall.    Will recommend to proceed with cycle #5 irinotecan '90mg'$ /m2 and avastin '10mg'$ /m2 today.  Chemotherapy should be held for the following:  ANC less than 1,000  Platelets less than 100,000  LFT or creatinine greater than 2x ULN  If clinical concerns/contraindications develop  Avastin should be held for the following:  ANC less than 500  Platelets less than 50,000  LFT or creatinine greater than 2x ULN  If clinical concerns/contraindications develop  For seizures, will con't Vimpat 100/100.  Keppra will con't '1500mg'$  BID.  We ask that [name] return to clinic in 3 weeks prior to next scheduled infusion of CPT-11, Avastin and labs.  Next MRI  brain in 9 weeks.  All questions were answered. The patient knows to call the clinic with any problems, questions or concerns. No barriers to learning were detected.  The total time spent in the encounter was 40 minutes and more than 50% was on  counseling and review of test results   Ventura Sellers, MD Medical Director of Neuro-Oncology [facility], [place] at [place] 07/04/22 12:04 PM

## 2022-07-04 NOTE — Progress Notes (Signed)
Ok to treat with Avastin and Irinotecan today 07/04/2022 with all labs and vital signs per Dr Mickeal Skinner.

## 2022-07-04 NOTE — Patient Instructions (Signed)
[facility]  Discharge Instructions: Thank you for choosing [facility] to provide your oncology and hematology care.   If you have a lab appointment with the [facility], please go directly to the [facility] and check in at the registration area.   Wear comfortable clothing and clothing appropriate for easy access to any Portacath or PICC line.   We strive to give you quality time with your provider. You may need to reschedule your appointment if you arrive late (15 or more minutes).  Arriving late affects you and other patients whose appointments are after yours.  Also, if you miss three or more appointments without notifying the office, you may be dismissed from the clinic at the provider's discretion.      For prescription refill requests, have your pharmacy contact our office and allow 72 hours for refills to be completed.    Today you received the following chemotherapy and/or immunotherapy agents: Bevacizumab, Irinotecan.      To help prevent nausea and vomiting after your treatment, we encourage you to take your nausea medication as directed.  BELOW ARE SYMPTOMS THAT SHOULD BE REPORTED IMMEDIATELY: *FEVER GREATER THAN 100.4 F (38 C) OR HIGHER *CHILLS OR SWEATING *NAUSEA AND VOMITING THAT IS NOT CONTROLLED WITH YOUR NAUSEA MEDICATION *UNUSUAL SHORTNESS OF BREATH *UNUSUAL BRUISING OR BLEEDING *URINARY PROBLEMS (pain or burning when urinating, or frequent urination) *BOWEL PROBLEMS (unusual diarrhea, constipation, pain near the anus) TENDERNESS IN MOUTH AND THROAT WITH OR WITHOUT PRESENCE OF ULCERS (sore throat, sores in mouth, or a toothache) UNUSUAL RASH, SWELLING OR PAIN  UNUSUAL VAGINAL DISCHARGE OR ITCHING   Items with * indicate a potential emergency and should be followed up as soon as possible or go to the Emergency Department if any problems should occur.  Please show the CHEMOTHERAPY ALERT CARD or IMMUNOTHERAPY  ALERT CARD at check-in to the Emergency Department and triage nurse.  Should you have questions after your visit or need to cancel or reschedule your appointment, please contact [facility]  Dept: [phone]  and follow the prompts.  Office hours are 8:00 a.m. to 4:30 p.m. Monday - Friday. Please note that voicemails left after 4:00 p.m. may not be returned until the following business day.  We are closed weekends and major holidays. You have access to a nurse at all times for urgent questions. Please call the main number to the clinic Dept: [phone] and follow the prompts.   For any non-urgent questions, you may also contact your provider using MyChart. We now offer e-Visits for anyone 62 and older to request care online for non-urgent symptoms. For details visit [email or web address].   Also download the MyChart app! Go to the app store, search "MyChart", open the app, select [facility], and log in with your MyChart username and password.

## 2022-07-05 ENCOUNTER — Encounter: Payer: Self-pay | Admitting: Internal Medicine

## 2022-07-19 ENCOUNTER — Telehealth: Payer: Self-pay | Admitting: Internal Medicine

## 2022-07-19 NOTE — Telephone Encounter (Signed)
Cancelled 03/19 appointments due to patient's request, sent scheduling message to provider.

## 2022-07-19 NOTE — Progress Notes (Deleted)
[facility]  Telephone:(336) [phone] Fax:(336) [phone]   Name: [name] Date: 07/19/2022 MRN: [id number]  DOB: 1966-03-04  Patient Care Team: [name], NP as PCP - General (Family Medicine) Jacki Cones, MD as Referring Physician (Gastroenterology) Truitt Merle, MD as Consulting Physician (Oncology) Mickeal Skinner Acey Lav, MD as Consulting Physician (Oncology)    INTERVAL HISTORY: [name] is a 57 y.o. female with medical history of glioblastoma s/p resection (11/2020) IMRT, currently on dexamethasone and Temodar, pancreatic lesion, hypertension, and hyperlipidemia.  Palliative ask to see for symptom management and goals of care.   SOCIAL HISTORY:     reports that she has never smoked. She has never used smokeless tobacco. She reports that she does not drink alcohol and does not use drugs.  ADVANCE DIRECTIVES:  None on file  CODE STATUS: Full code  PAST MEDICAL HISTORY: Past Medical History:  Diagnosis Date   Bell's palsy    left side of face- notied in smile and eyes, if very tired   Brain cancer ([facility])    Complication of anesthesia    N/V   Essential hypertension    External hemorrhoids    History of blood transfusion    post op Hemorroids   History of pneumonia    Hyperlipidemia    Hyperthyroidism    hx 57 years old- oral meds   Iron deficiency anemia    Migraine headache    Pneumonia    x 2 - years ago   PONV (postoperative nausea and vomiting)    PPD positive    Pulmonary nodules    not seen on plain cxr, first detected 1/07 re ct 10/08 pos IPPD > 15 mm 12/09 FOB/lavage 04/20/08 neg afb smear   PVC's (premature ventricular contractions)    Seizure ([facility]) 11/15/2020   Sleep apnea    refused CPAP- mouth piece recommended   Tuberculosis    in her late 30's    ALLERGIES:  has No Known Allergies.  MEDICATIONS:  Current Outpatient Medications  Medication Sig Dispense Refill   amLODipine (NORVASC) 10  MG tablet TAKE 1 TABLET BY MOUTH EVERY DAY 90 tablet 1   dexamethasone (DECADRON) 1 MG tablet Take 1 mg by mouth 2 (two) times daily.     gabapentin (NEURONTIN) 100 MG capsule Take 1 capsule (100 mg total) by mouth 2 (two) times daily. 60 capsule 3   lacosamide 100 MG TABS Take 1 tablet (100 mg total) by mouth 2 (two) times daily. 60 tablet 2   levETIRAcetam (KEPPRA) 750 MG tablet Take 2 tablets (1,500 mg total) by mouth 2 (two) times daily. 120 tablet 2   levothyroxine (SYNTHROID) 100 MCG tablet Take 1 tablet (100 mcg total) by mouth daily. 90 tablet 3   LORazepam (ATIVAN) 2 MG tablet Take 1 tablet (2 mg total) by mouth every 8 (eight) hours as needed for seizure. 12 tablet 0   ondansetron (ZOFRAN-ODT) 4 MG disintegrating tablet Take 1 tablet (4 mg total) by mouth every 8 (eight) hours as needed for nausea or vomiting. 10 tablet 0   Potassium Chloride ER 20 MEQ TBCR TAKE 1 TABLET BY MOUTH EVERY DAY 90 tablet 3   prochlorperazine (COMPAZINE) 10 MG tablet Take 10 mg by mouth every 6 (six) hours as needed for nausea or vomiting.     No current facility-administered medications for this visit.    VITAL SIGNS: There were no vitals taken for this visit. There were no  vitals filed for this visit.  Estimated body mass index is 26.61 kg/m as calculated from the following:   Height as of 05/29/22: 5\' 7"  (1.702 m).   Weight as of 07/04/22: 169 lb 14.4 oz (77.1 kg).   PERFORMANCE STATUS (ECOG) : 1 - Symptomatic but completely ambulatory   Physical Exam General: NAD Head: Scarring from previous surgeries, left side of face swollen, left temple area slightly sunken, facial droop Neurological: alert and oriented x 4, appropriate affect Cardiovascular: regular rate and rhythm Pulmonary: clear ant fields Abdomen: soft, nontender, + bowel sounds Extremities: no joint deformities Skin: no rashes   IMPRESSION:   Pain: Ms. Elsner endorses occasional, sharp pains to the left side of her head near  the temple area. States that, sometimes, the pain wraps around the crown of her head. Left side of face is slightly swollen. The left temple area, near site of previous surgery is sunken.   Pain episodes are infrequent and patient prefers to "try to relax" to get through them. She gently rubs her left jaw and temple or lies down for a nap until the pain passes.  Seizure Management:  Appetite:  Constipation:  Fatigue:    Goals of Care:  2/29- No changes were made to patient's regimen at this time. Time was spent building rapport and encouraging patient in the many healthy habits that she has in place. She verbalizes understanding to call Palliative team with any questions/needs/concerns.  We discussed Ms. Hubers's current illness and what it means in the larger context of Her on-going co-morbidities. Natural disease trajectory and expectations were discussed. We discussed the importance of continued conversation with family and their medical providers regarding overall plan of care and treatment options, ensuring decisions are within the context of the patients values and GOCs.  PLAN: Ongoing goals of care discussion. Spent time establishing therapeutic relationship.  No changes to medication regimen at this time.  Will continue to monitor closely and adjust regimen as needed. Follow-up in 2-3 weeks coordinated with upcoming oncology appointments.  Patient expressed understanding and was in agreement with this plan. She also understands that She can call the clinic at any time with any questions, concerns, or complaints.   Time Total: 40 min   Visit consisted of counseling and education dealing with the complex and emotionally intense issues of symptom management and palliative care in the setting of serious and potentially life-threatening illness.Greater than 50%  of this time was spent counseling and coordinating care related to the above assessment and plan.   Signed by: Moss Mc, RN MSN [facility] / NP Student  Alda Lea, AGPCNP-BC  Palliative Medicine Team/[facility]

## 2022-07-23 ENCOUNTER — Inpatient Hospital Stay: Payer: BC Managed Care – PPO | Admitting: Internal Medicine

## 2022-07-23 ENCOUNTER — Inpatient Hospital Stay: Payer: BC Managed Care – PPO

## 2022-07-23 ENCOUNTER — Telehealth: Payer: Self-pay | Admitting: Internal Medicine

## 2022-07-23 NOTE — Telephone Encounter (Signed)
Called patient regarding upcoming 03/28 appointment, patient is notified.

## 2022-07-25 ENCOUNTER — Ambulatory Visit: Payer: BC Managed Care – PPO

## 2022-07-25 ENCOUNTER — Other Ambulatory Visit: Payer: BC Managed Care – PPO

## 2022-07-25 ENCOUNTER — Inpatient Hospital Stay: Payer: BC Managed Care – PPO

## 2022-07-25 ENCOUNTER — Ambulatory Visit: Payer: BC Managed Care – PPO | Admitting: Internal Medicine

## 2022-08-01 ENCOUNTER — Inpatient Hospital Stay: Payer: BC Managed Care – PPO

## 2022-08-01 ENCOUNTER — Inpatient Hospital Stay: Payer: BC Managed Care – PPO | Attending: Internal Medicine

## 2022-08-01 ENCOUNTER — Inpatient Hospital Stay (HOSPITAL_BASED_OUTPATIENT_CLINIC_OR_DEPARTMENT_OTHER): Payer: BC Managed Care – PPO | Admitting: Internal Medicine

## 2022-08-01 ENCOUNTER — Other Ambulatory Visit: Payer: Self-pay

## 2022-08-01 VITALS — BP 151/88 | HR 81 | Temp 97.3°F | Resp 17 | Wt 173.4 lb

## 2022-08-01 VITALS — BP 146/89 | HR 79

## 2022-08-01 DIAGNOSIS — Z90710 Acquired absence of both cervix and uterus: Secondary | ICD-10-CM | POA: Diagnosis not present

## 2022-08-01 DIAGNOSIS — I10 Essential (primary) hypertension: Secondary | ICD-10-CM | POA: Diagnosis not present

## 2022-08-01 DIAGNOSIS — Z79899 Other long term (current) drug therapy: Secondary | ICD-10-CM | POA: Diagnosis not present

## 2022-08-01 DIAGNOSIS — Z5111 Encounter for antineoplastic chemotherapy: Secondary | ICD-10-CM | POA: Insufficient documentation

## 2022-08-01 DIAGNOSIS — C712 Malignant neoplasm of temporal lobe: Secondary | ICD-10-CM | POA: Diagnosis not present

## 2022-08-01 DIAGNOSIS — C719 Malignant neoplasm of brain, unspecified: Secondary | ICD-10-CM | POA: Diagnosis not present

## 2022-08-01 DIAGNOSIS — R569 Unspecified convulsions: Secondary | ICD-10-CM | POA: Diagnosis not present

## 2022-08-01 LAB — CBC WITH DIFFERENTIAL (CANCER CENTER ONLY)
Abs Immature Granulocytes: 0.05 10*3/uL (ref 0.00–0.07)
Basophils Absolute: 0 10*3/uL (ref 0.0–0.1)
Basophils Relative: 0 %
Eosinophils Absolute: 0 10*3/uL (ref 0.0–0.5)
Eosinophils Relative: 1 %
HCT: 42.8 % (ref 36.0–46.0)
Hemoglobin: 13.7 g/dL (ref 12.0–15.0)
Immature Granulocytes: 1 %
Lymphocytes Relative: 18 %
Lymphs Abs: 0.9 10*3/uL (ref 0.7–4.0)
MCH: 30.6 pg (ref 26.0–34.0)
MCHC: 32 g/dL (ref 30.0–36.0)
MCV: 95.5 fL (ref 80.0–100.0)
Monocytes Absolute: 0.6 10*3/uL (ref 0.1–1.0)
Monocytes Relative: 13 %
Neutro Abs: 3.4 10*3/uL (ref 1.7–7.7)
Neutrophils Relative %: 67 %
Platelet Count: 174 10*3/uL (ref 150–400)
RBC: 4.48 MIL/uL (ref 3.87–5.11)
RDW: 17.2 % — ABNORMAL HIGH (ref 11.5–15.5)
WBC Count: 5.1 10*3/uL (ref 4.0–10.5)
nRBC: 0 % (ref 0.0–0.2)

## 2022-08-01 LAB — CMP (CANCER CENTER ONLY)
ALT: 24 U/L (ref 0–44)
AST: 14 U/L — ABNORMAL LOW (ref 15–41)
Albumin: 4.3 g/dL (ref 3.5–5.0)
Alkaline Phosphatase: 114 U/L (ref 38–126)
Anion gap: 10 (ref 5–15)
BUN: 18 mg/dL (ref 6–20)
CO2: 28 mmol/L (ref 22–32)
Calcium: 9.7 mg/dL (ref 8.9–10.3)
Chloride: 104 mmol/L (ref 98–111)
Creatinine: 0.85 mg/dL (ref 0.44–1.00)
GFR, Estimated: >60 mL/min (ref >60)
Glucose, Bld: 110 mg/dL — ABNORMAL HIGH (ref 70–99)
Potassium: 3.6 mmol/L (ref 3.5–5.1)
Sodium: 142 mmol/L (ref 135–145)
Total Bilirubin: 0.9 mg/dL (ref 0.3–1.2)
Total Protein: 7.2 g/dL (ref 6.5–8.1)

## 2022-08-01 LAB — TOTAL PROTEIN, URINE DIPSTICK: Protein, ur: NEGATIVE mg/dL

## 2022-08-01 MED ORDER — SODIUM CHLORIDE 0.9 % IV SOLN
125.0000 mg/m2 | Freq: Once | INTRAVENOUS | Status: AC
  Administered 2022-08-01: 240 mg via INTRAVENOUS
  Filled 2022-08-01: qty 12

## 2022-08-01 MED ORDER — SODIUM CHLORIDE 0.9 % IV SOLN
10.0000 mg/kg | Freq: Once | INTRAVENOUS | Status: AC
  Administered 2022-08-01: 700 mg via INTRAVENOUS
  Filled 2022-08-01: qty 28

## 2022-08-01 MED ORDER — ATROPINE SULFATE 1 MG/ML IV SOLN
0.5000 mg | Freq: Once | INTRAVENOUS | Status: AC | PRN
  Administered 2022-08-01: 0.5 mg via INTRAVENOUS
  Filled 2022-08-01: qty 1

## 2022-08-01 MED ORDER — SODIUM CHLORIDE 0.9 % IV SOLN: Freq: Once | INTRAVENOUS | Status: AC

## 2022-08-01 MED ORDER — PALONOSETRON HCL INJECTION 0.25 MG/5ML
0.2500 mg | Freq: Once | INTRAVENOUS | Status: AC
  Administered 2022-08-01: 0.25 mg via INTRAVENOUS
  Filled 2022-08-01: qty 5

## 2022-08-01 MED ORDER — SODIUM CHLORIDE 0.9 % IV SOLN
10.0000 mg | Freq: Once | INTRAVENOUS | Status: AC
  Administered 2022-08-01: 10 mg via INTRAVENOUS
  Filled 2022-08-01: qty 10

## 2022-08-01 NOTE — Patient Instructions (Signed)
[facility]  Discharge Instructions: Thank you for choosing [facility] to provide your oncology and hematology care.   If you have a lab appointment with the Cancer Center, please go directly to the Cancer Center and check in at the registration area.   Wear comfortable clothing and clothing appropriate for easy access to any Portacath or PICC line.   We strive to give you quality time with your provider. You may need to reschedule your appointment if you arrive late (15 or more minutes).  Arriving late affects you and other patients whose appointments are after yours.  Also, if you miss three or more appointments without notifying the office, you may be dismissed from the clinic at the provider's discretion.      For prescription refill requests, have your pharmacy contact our office and allow 72 hours for refills to be completed.    Today you received the following chemotherapy and/or immunotherapy agents: Bevacizumab, Irinotecan.      To help prevent nausea and vomiting after your treatment, we encourage you to take your nausea medication as directed.  BELOW ARE SYMPTOMS THAT SHOULD BE REPORTED IMMEDIATELY: *FEVER GREATER THAN 100.4 F (38 C) OR HIGHER *CHILLS OR SWEATING *NAUSEA AND VOMITING THAT IS NOT CONTROLLED WITH YOUR NAUSEA MEDICATION *UNUSUAL SHORTNESS OF BREATH *UNUSUAL BRUISING OR BLEEDING *URINARY PROBLEMS (pain or burning when urinating, or frequent urination) *BOWEL PROBLEMS (unusual diarrhea, constipation, pain near the anus) TENDERNESS IN MOUTH AND THROAT WITH OR WITHOUT PRESENCE OF ULCERS (sore throat, sores in mouth, or a toothache) UNUSUAL RASH, SWELLING OR PAIN  UNUSUAL VAGINAL DISCHARGE OR ITCHING   Items with * indicate a potential emergency and should be followed up as soon as possible or go to the Emergency Department if any problems should occur.  Please show the CHEMOTHERAPY ALERT CARD or IMMUNOTHERAPY  ALERT CARD at check-in to the Emergency Department and triage nurse.  Should you have questions after your visit or need to cancel or reschedule your appointment, please contact [facility]  Dept: [phone]  and follow the prompts.  Office hours are 8:00 a.m. to 4:30 p.m. Monday - Friday. Please note that voicemails left after 4:00 p.m. may not be returned until the following business day.  We are closed weekends and major holidays. You have access to a nurse at all times for urgent questions. Please call the main number to the clinic Dept: [phone] and follow the prompts.   For any non-urgent questions, you may also contact your provider using MyChart. We now offer e-Visits for anyone 18 and older to request care online for non-urgent symptoms. For details visit [email or web address].   Also download the MyChart app! Go to the app store, search "MyChart", open the app, select [facility], and log in with your MyChart username and password.   

## 2022-08-01 NOTE — Progress Notes (Signed)
[facility] [place], [place] [address] [phone]   Interval Evaluation  Date of Service: 08/01/22 Patient Name: [name] Patient MRN: [id number] Patient DOB: May 19, 1965 Provider: Ventura Sellers, MD  Identifying Statement:  [name] is a 57 y.o. female with left temporal glioblastoma   Oncologic History: Oncology History  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[place])  11/21/2020 Initial Diagnosis   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[place])   11/21/2020 Cancer Staging   Staging form: Brain and Spinal Cord, AJCC 8th Edition - Pathologic stage from 11/21/2020: WHO Grade IV - Signed by Ventura Sellers, MD on 11/30/2020 Histopathologic type: Glioblastoma Stage prefix: Initial diagnosis Histologic grading system: 4 grade system Extent of surgical resection: Subtotal resection Karnofsky performance status: Score 90 Seizures at presentation: Present Duration of symptoms before diagnosis: Short IDH1 mutation: Negative   12/27/2020 - 02/07/2021 Radiation Therapy   IMRT and concurrent Temodar [name])   03/12/2021 - 08/26/2021 Chemotherapy   Completes 5 cycles of adjuvant 5-day Temodar   08/27/2021 Progression   POD #1   09/06/2021 -  Chemotherapy   Initiates second line therapy; CCNU 90mg /m2 PO q6 weeks and avastin 10mg /kg IV q2 weeks   12/28/2021 Progression   POD #2   01/17/2022 -  Chemotherapy   Third line therapy with Irinotecan and Avastin q2 weeks     Biomarkers:  MGMT Methylated.  IDH 1/2 Wild type.  EGFR Unknown  TERT Unknown   Interval History: [name] presents today for avastin and irinotecan infusion.  Denies any further seizures.  No new or progressive neurologic complaints today. Short term memory impairment and fatigue are still persistent.  Does have fluctuations in language expression.    H+P (11/25/20) Patient presented to medical attention  this past month with first ever seizure.  She describes episode of sudden onset "disconnection" with tongue biting and urination, followed by period of confusion.  CNS imaging demonstrated enhancing mass within left anterior temporal lobe, which was mostly resected by Dr. Zada Finders on 11/21/20.  Since surgery she had not had recurrence of seizures.  She complains of painful cramping of her right leg, which started ~1 week prior.  At this time the cramping is her main complaint.  Otherwise fully functional, independent, would like to return to work if possible.  Medications: Current Outpatient Medications on File Prior to Visit  Medication Sig Dispense Refill   amLODipine (NORVASC) 10 MG tablet TAKE 1 TABLET BY MOUTH EVERY DAY 90 tablet 1   dexamethasone (DECADRON) 1 MG tablet Take 1 mg by mouth 2 (two) times daily.     gabapentin (NEURONTIN) 100 MG capsule Take 1 capsule (100 mg total) by mouth 2 (two) times daily. 60 capsule 3   lacosamide 100 MG TABS Take 1 tablet (100 mg total) by mouth 2 (two) times daily. 60 tablet 2   levETIRAcetam (KEPPRA) 750 MG tablet Take 2 tablets (1,500 mg total) by mouth 2 (two) times daily. 120 tablet 2   levothyroxine (SYNTHROID) 100 MCG tablet Take 1 tablet (100 mcg total) by mouth daily. 90 tablet 3   LORazepam (ATIVAN) 2 MG tablet Take 1 tablet (2 mg total) by mouth every 8 (eight) hours as needed for seizure. 12 tablet 0   ondansetron (ZOFRAN-ODT) 4 MG disintegrating tablet Take 1 tablet (4 mg total) by mouth every 8 (eight) hours as needed for nausea or vomiting. 10 tablet 0   Potassium Chloride  ER 20 MEQ TBCR TAKE 1 TABLET BY MOUTH EVERY DAY 90 tablet 3   prochlorperazine (COMPAZINE) 10 MG tablet Take 10 mg by mouth every 6 (six) hours as needed for nausea or vomiting.     No current facility-administered medications on file prior to visit.    Allergies: No Known Allergies Past Medical History:  Past Medical History:  Diagnosis Date   Bell's palsy    left  side of face- notied in smile and eyes, if very tired   Brain cancer ([facility])    Complication of anesthesia    N/V   Essential hypertension    External hemorrhoids    History of blood transfusion    post op Hemorroids   History of pneumonia    Hyperlipidemia    Hyperthyroidism    hx 57 years old- oral meds   Iron deficiency anemia    Migraine headache    Pneumonia    x 2 - years ago   PONV (postoperative nausea and vomiting)    PPD positive    Pulmonary nodules    not seen on plain cxr, first detected 1/07 re ct 10/08 pos IPPD > 15 mm 12/09 FOB/lavage 04/20/08 neg afb smear   PVC's (premature ventricular contractions)    Seizure ([facility]) 11/15/2020   Sleep apnea    refused CPAP- mouth piece recommended   Tuberculosis    in her late 30's   Past Surgical History:  Past Surgical History:  Procedure Laterality Date   ABDOMINAL HYSTERECTOMY     APPLICATION OF CRANIAL NAVIGATION N/A 11/21/2020   Procedure: APPLICATION OF CRANIAL NAVIGATION;  Surgeon: Judith Part, MD;  Location: [facility];  Service: Neurosurgery;  Laterality: N/A;   BREAST BIOPSY Right 2014   BREAST BIOPSY Left 10/29/2019   CHOLECYSTECTOMY  2014   COLONOSCOPY  05/04/2009   prolapsing external hemorrhoids   CRANIOTOMY Left 11/21/2020   Procedure: Left craniotomy for tumor resection;  Surgeon: Judith Part, MD;  Location: [facility];  Service: Neurosurgery;  Laterality: Left;   ENDOMETRIAL ABLATION     HEMORRHOID SURGERY     x 3   TUBAL LIGATION     Social History:  Social History   Socioeconomic History   Marital status: Divorced    Spouse name: Not on file   Number of children: 2   Years of education: Not on file   Highest education level: Associate degree: occupational, Hotel manager, or vocational program  Occupational History   Occupation: accounts Sport and exercise psychologist: [employer]  Tobacco Use   Smoking status: Never   Smokeless tobacco: Never  Vaping Use   Vaping Use: Never used  Substance and  Sexual Activity   Alcohol use: No    Alcohol/week: 0.0 standard drinks of alcohol   Drug use: No   Sexual activity: Not on file  Other Topics Concern   Not on file  Social History Narrative   Divorced with 2 children   She works as a Librarian, 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Press photographer    Possible TB exposure 2008 (after nodules found)   From [place] lived there and [place]   Never owned birds      Social Determinants of [place] Strain: [place] (06/13/2021)   Overall Financial Resource Strain (CARDIA)    Difficulty of Paying Living Expenses: Somewhat hard  Food Insecurity: Food Insecurity Present (04/23/2021)   Hunger Vital Sign    Worried About [place] in the Last Year: Sometimes  true    Ran Out of Food in the Last Year: Never true  Transportation Needs: No Transportation Needs (06/13/2021)   PRAPARE - Hydrologist (Medical): No    Lack of Transportation (Non-Medical): No  Physical Activity: Unknown (04/23/2021)   Exercise Vital Sign    Days of Exercise per Week: 0 days    Minutes of Exercise per Session: Not on file  Stress: No Stress Concern Present (04/23/2021)   [place]    Feeling of Stress : Only a little  Social Connections: Moderately Integrated (04/23/2021)   Social Connection and Isolation Panel [NHANES]    Frequency of Communication with Friends and Family: Twice a week    Frequency of Social Gatherings with Friends and Family: Twice a week    Attends Religious Services: More than 4 times per year    Active Member of [employer] or Organizations: Yes    Attends Music therapist: More than 4 times per year    Marital Status: Divorced  Human resources officer Violence: Not on file   Family History:  Family History  Problem Relation Age of Onset   Diabetes type II Mother    Kidney failure Sister    Diabetes Mellitus II Sister    Colon cancer Maternal Aunt        aunts  and uncles   Diabetes Mellitus II Maternal Aunt    Pancreatic cancer Maternal Aunt    Colon cancer Maternal Uncle    Diabetes Mellitus II Maternal Uncle    Heart disease Maternal Grandmother        aunts, uncles, sister   Diabetes Mellitus II Maternal Grandmother    Lung cancer Other        aunts and uncles    Review of Systems: Constitutional: poor appetite Eyes: blurriness of vision Ears, nose, mouth, throat, and face: Doesn't report sore throat Respiratory: Doesn't report cough, dyspnea or wheezes Cardiovascular: Doesn't report palpitation, chest discomfort  Gastrointestinal:  Doesn't report nausea, constipation, diarrhea GU: Doesn't report incontinence Skin: Doesn't report skin rashes Neurological: Per HPI Musculoskeletal: bilateral shoulder pain Behavioral/Psych: +anxiety  Physical Exam: There were no vitals filed for this visit.   KPS: 70. General: Alert, cooperative, pleasant, in no acute distress Head: Normal EENT: No conjunctival injection or scleral icterus.  Lungs: Resp effort normal Cardiac: Regular rate Abdomen: Non-distended abdomen Skin: No rashes cyanosis or petechiae. Extremities: No clubbing or edema  Neurologic Exam: Mental Status: Awake, alert, attentive to examiner. Oriented to self and environment. Language is notable for modest impairment in fluency.  Impaired short term recall. Cranial Nerves: Visual acuity is grossly normal. Visual fields are full. Extra-ocular movements intact. No ptosis. Face L LMN paresis, chronic. Motor: Tone and bulk are normal. Power is full in both arms and legs. Reflexes are symmetric, no pathologic reflexes present.  Sensory: Intact to light touch Gait: Normal.   Labs: I have reviewed the data as listed    Component Value Date/Time   NA 140 07/04/2022 1108   K 3.6 07/04/2022 1108   CL 103 07/04/2022 1108   CO2 28 07/04/2022 1108   GLUCOSE 126 (H) 07/04/2022 1108   BUN 11 07/04/2022 1108   CREATININE 0.73  07/04/2022 1108   CREATININE 0.92 01/25/2020 1424   CALCIUM 9.5 07/04/2022 1108   PROT 7.0 07/04/2022 1108   ALBUMIN 4.5 07/04/2022 1108   AST 14 (L) 07/04/2022 1108   ALT 21 07/04/2022  1108   ALKPHOS 136 (H) 07/04/2022 1108   BILITOT 0.6 07/04/2022 1108   GFRNONAA >60 07/04/2022 1108   GFRNONAA 72 01/25/2020 1424   GFRAA 83 01/25/2020 1424   Lab Results  Component Value Date   WBC 5.1 08/01/2022   NEUTROABS 3.4 08/01/2022   HGB 13.7 08/01/2022   HCT 42.8 08/01/2022   MCV 95.5 08/01/2022   PLT 174 08/01/2022    Imaging:  [facility] Clinician Interpretation: I have personally reviewed the CNS images as listed.  My interpretation, in the context of the patient's clinical presentation, is treatment effect vs true progression  MR BRAIN W WO CONTRAST  Result Date: 07/05/2022 CLINICAL DATA:  Glioblastoma follow-up EXAM: MRI HEAD WITHOUT AND WITH CONTRAST TECHNIQUE: Multiplanar, multiecho pulse sequences of the brain and surrounding structures were obtained without and with intravenous contrast. CONTRAST:  42mL GADAVIST GADOBUTROL 1 MMOL/ML IV SOLN COMPARISON:  05/30/2022 FINDINGS: Brain: Hemispheric mass on the left with T1 hyperintensity mainly intrinsic from mineralization around the atrium and temporal horn of the left lateral ventricle, up to 2.4 cm. History is unchanged without masslike features. The same features affecting the smaller area seen anterior to the frontal horn of the left lateral ventricle. There are areas of diffusion hyperintense nodularity around the left lateral ventricle and in the left parietal white matter which are nonenhancing. These are non progressed, the globus pallidus area is intervally resolved and was presumably ischemic. The most recently developed nodule adjacent to the left lateral ventricle near the corpus callosum persists and is more likely tumor related. No worrisome mass effect. Unchanged resection cavity in the anterior left temporal lobe. No acute  hemorrhage, hydrocephalus, or collection. Vascular: Normal flow voids. Skull and upper cervical spine: Unremarkable craniotomy site Sinuses/Orbits: Negative. IMPRESSION: No progressive finding since 05/30/2022. 1 of the 2 diffusion hyperintense nodules highlighted on prior has resolved and was likely ischemic. Electronically Signed   By: Jorje Guild M.D.   On: 07/05/2022 04:29     Assessment/Plan Glioblastoma with isocitrate dehydrogenase gene wildtype [facility]) [C71.9]  [name] is clinically stable today.  Labs are within normal limits.  Will recommend to proceed with scheduled irinotecan 90mg /m2 and avastin 10mg /m2 today.  Chemotherapy should be held for the following:  ANC less than 1,000  Platelets less than 100,000  LFT or creatinine greater than 2x ULN  If clinical concerns/contraindications develop  Avastin should be held for the following:  ANC less than 500  Platelets less than 50,000  LFT or creatinine greater than 2x ULN  If clinical concerns/contraindications develop  For seizures, will con't Vimpat 100/100.  Keppra will con't 1500mg  BID.  Has discontinued decadron.  We ask that [name] return to clinic in 3 weeks prior to next scheduled infusion of CPT-11, Avastin and labs.  Next MRI  brain in 5 weeks.  All questions were answered. The patient knows to call the clinic with any problems, questions or concerns. No barriers to learning were detected.  The total time spent in the encounter was 30 minutes and more than 50% was on counseling and review of test results   Ventura Sellers, MD Medical Director of Neuro-Oncology [facility] 08/01/22 12:01 PM

## 2022-08-01 NOTE — Progress Notes (Signed)
Patient c/o sudden severe nausea after the irinotecan was started. Patient stated that "I feel like I am going to vomit". Patient took a compazine 10 mg tablet that she had from home. 15 minutes later she stated that she was starting to feel better.

## 2022-08-01 NOTE — Progress Notes (Signed)
[facility] CSW Progress Note  Holiday representative met with patient per her request to discuss financial needs.  She said she is receiving some help with food from her family, but could use some assistance.  Provided her with two [employer] and a bag of food.  She is on long term disability at work and agreed to have [place] make a referral to the [facility] to apply for SSDI.  She expressed no other needs at this time.    Rodman Pickle Dollie Mayse, LCSW

## 2022-08-01 NOTE — Progress Notes (Signed)
Continue bevacizumab dose at 700 mg per Dr. Mickeal Skinner.  Kennith Center, Pharm.D., CPP 08/01/2022@1 :26 PM

## 2022-08-05 ENCOUNTER — Inpatient Hospital Stay: Payer: BC Managed Care – PPO

## 2022-08-06 ENCOUNTER — Telehealth (INDEPENDENT_AMBULATORY_CARE_PROVIDER_SITE_OTHER): Payer: BC Managed Care – PPO | Admitting: Adult Health

## 2022-08-06 ENCOUNTER — Encounter: Payer: Self-pay | Admitting: Adult Health

## 2022-08-06 VITALS — Ht 67.0 in | Wt 176.0 lb

## 2022-08-06 DIAGNOSIS — R051 Acute cough: Secondary | ICD-10-CM

## 2022-08-06 DIAGNOSIS — J0141 Acute recurrent pansinusitis: Secondary | ICD-10-CM

## 2022-08-06 MED ORDER — AZITHROMYCIN 250 MG PO TABS
ORAL_TABLET | ORAL | 0 refills | Status: AC
Start: 2022-08-06 — End: 2022-08-11

## 2022-08-06 MED ORDER — HYDROCODONE BIT-HOMATROP MBR 5-1.5 MG/5ML PO SOLN
5.0000 mL | Freq: Three times a day (TID) | ORAL | 0 refills | Status: DC | PRN
Start: 2022-08-06 — End: 2023-10-05

## 2022-08-06 NOTE — Progress Notes (Signed)
Virtual Visit via Video Note  I connected with [name]  on 08/06/22 at  1:30 PM EDT by a video enabled telemedicine application and verified that I am speaking with the correct person using two identifiers.  Location patient: home Location provider:work or home office Persons participating in the virtual visit: patient, provider  I discussed the limitations of evaluation and management by telemedicine and the availability of in person appointments. The patient expressed understanding and agreed to proceed.   HPI: 57 year old female who is being evaluated today for an acute issue.  Her symptoms started within the last week and has become progressively worse.  Symptoms include headache, sinus congestion,rhinorrhea, sore throat, fatigue, decreased appetite, and a dry cough.  Her cough is worse at night.  She has not had any fevers, chills, shortness of breath, or wheezing.   ROS: See pertinent positives and negatives per HPI.  Past Medical History:  Diagnosis Date   Bell's palsy    left side of face- notied in smile and eyes, if very tired   Brain cancer    Complication of anesthesia    N/V   Essential hypertension    External hemorrhoids    History of blood transfusion    post op Hemorroids   History of pneumonia    Hyperlipidemia    Hyperthyroidism    hx 57 years old- oral meds   Iron deficiency anemia    Migraine headache    Pneumonia    x 2 - years ago   PONV (postoperative nausea and vomiting)    PPD positive    Pulmonary nodules    not seen on plain cxr, first detected 1/07 re ct 10/08 pos IPPD > 15 mm 12/09 FOB/lavage 04/20/08 neg afb smear   PVC's (premature ventricular contractions)    Seizure 11/15/2020   Sleep apnea    refused CPAP- mouth piece recommended   Tuberculosis    in her late 30's    Past Surgical History:  Procedure Laterality Date   ABDOMINAL HYSTERECTOMY     APPLICATION OF CRANIAL NAVIGATION N/A 11/21/2020   Procedure: APPLICATION OF  CRANIAL NAVIGATION;  Surgeon: Judith Part, MD;  Location: [facility];  Service: Neurosurgery;  Laterality: N/A;   BREAST BIOPSY Right 2014   BREAST BIOPSY Left 10/29/2019   CHOLECYSTECTOMY  2014   COLONOSCOPY  05/04/2009   prolapsing external hemorrhoids   CRANIOTOMY Left 11/21/2020   Procedure: Left craniotomy for tumor resection;  Surgeon: Judith Part, MD;  Location: [facility];  Service: Neurosurgery;  Laterality: Left;   ENDOMETRIAL ABLATION     HEMORRHOID SURGERY     x 3   TUBAL LIGATION      Family History  Problem Relation Age of Onset   Diabetes type II Mother    Kidney failure Sister    Diabetes Mellitus II Sister    Colon cancer Maternal Aunt        aunts and uncles   Diabetes Mellitus II Maternal Aunt    Pancreatic cancer Maternal Aunt    Colon cancer Maternal Uncle    Diabetes Mellitus II Maternal Uncle    Heart disease Maternal Grandmother        aunts, uncles, sister   Diabetes Mellitus II Maternal Grandmother    Lung cancer Other        aunts and uncles       Current Outpatient Medications:    amLODipine (NORVASC) 10 MG tablet, TAKE 1 TABLET BY MOUTH EVERY DAY,  Disp: 90 tablet, Rfl: 1   azithromycin (ZITHROMAX) 250 MG tablet, Take 2 tablets on day 1, then 1 tablet daily on days 2 through 5, Disp: 6 tablet, Rfl: 0   gabapentin (NEURONTIN) 100 MG capsule, Take 1 capsule (100 mg total) by mouth 2 (two) times daily., Disp: 60 capsule, Rfl: 3   HYDROcodone bit-homatropine (HYCODAN) 5-1.5 MG/5ML syrup, Take 5 mLs by mouth every 8 (eight) hours as needed for cough., Disp: 120 mL, Rfl: 0   lacosamide 100 MG TABS, Take 1 tablet (100 mg total) by mouth 2 (two) times daily., Disp: 60 tablet, Rfl: 2   levETIRAcetam (KEPPRA) 750 MG tablet, Take 2 tablets (1,500 mg total) by mouth 2 (two) times daily., Disp: 120 tablet, Rfl: 2   levothyroxine (SYNTHROID) 100 MCG tablet, Take 1 tablet (100 mcg total) by mouth daily., Disp: 90 tablet, Rfl: 3   LORazepam (ATIVAN) 2 MG  tablet, Take 1 tablet (2 mg total) by mouth every 8 (eight) hours as needed for seizure., Disp: 12 tablet, Rfl: 0   ondansetron (ZOFRAN-ODT) 4 MG disintegrating tablet, Take 1 tablet (4 mg total) by mouth every 8 (eight) hours as needed for nausea or vomiting., Disp: 10 tablet, Rfl: 0   Potassium Chloride ER 20 MEQ TBCR, TAKE 1 TABLET BY MOUTH EVERY DAY, Disp: 90 tablet, Rfl: 3   prochlorperazine (COMPAZINE) 10 MG tablet, Take 10 mg by mouth every 6 (six) hours as needed for nausea or vomiting., Disp: , Rfl:   EXAM:  VITALS per patient if applicable:  GENERAL: alert, oriented, appears well and in no acute distress  HEENT: atraumatic, conjunttiva clear, no obvious abnormalities on inspection of external nose and ears  NECK: normal movements of the head and neck  LUNGS: on inspection no signs of respiratory distress, breathing rate appears normal, no obvious gross SOB, gasping or wheezing  CV: no obvious cyanosis  MS: moves all visible extremities without noticeable abnormality  PSYCH/NEURO: pleasant and cooperative, no obvious depression or anxiety, speech and thought processing grossly intact  ASSESSMENT AND PLAN:  Discussed the following assessment and plan:  1. Acute recurrent pansinusitis -Treat with azithromycin, her symptoms similar to when she has had a sinus infection in the past. - Follow up if not improving throughout the week - azithromycin (ZITHROMAX) 250 MG tablet; Take 2 tablets on day 1, then 1 tablet daily on days 2 through 5  Dispense: 6 tablet; Refill: 0  2. Acute cough - She is aware of the sedating effects of hycodan and will only use it when she goes to bed.  - HYDROcodone bit-homatropine (HYCODAN) 5-1.5 MG/5ML syrup; Take 5 mLs by mouth every 8 (eight) hours as needed for cough.  Dispense: 120 mL; Refill: 0    I discussed the assessment and treatment plan with the patient. The patient was provided an opportunity to ask questions and all were answered. The  patient agreed with the plan and demonstrated an understanding of the instructions.   The patient was advised to call back or seek an in-person evaluation if the symptoms worsen or if the condition fails to improve as anticipated.   [name], NP

## 2022-08-15 NOTE — Progress Notes (Deleted)
Palliative Medicine [facility]  [phone] [phone]   Name: [name] Date: 08/15/2022 MRN: [id number]  DOB: 01-07-1966  Patient Care Team: Shirline Frees, NP as PCP - General (Family Medicine) Trilby Leaver, MD as Referring Physician (Gastroenterology) Malachy Mood, MD as Consulting Physician (Oncology) Barbaraann Cao Georgeanna Lea, MD as Consulting Physician (Oncology)    INTERVAL HISTORY: [name] is a 57 y.o. female with medical history of glioblastoma s/p resection (11/2020) IMRT, currently on dexamethasone and Temodar, pancreatic lesion, hypertension, and hyperlipidemia.  Palliative ask to see for symptom management and goals of care.   SOCIAL HISTORY:     reports that she has never smoked. She has never used smokeless tobacco. She reports that she does not drink alcohol and does not use drugs.  ADVANCE DIRECTIVES:  None on file  CODE STATUS: Full code  PAST MEDICAL HISTORY: Past Medical History:  Diagnosis Date   Bell's palsy    left side of face- notied in smile and eyes, if very tired   Brain cancer    Complication of anesthesia    N/V   Essential hypertension    External hemorrhoids    History of blood transfusion    post op Hemorroids   History of pneumonia    Hyperlipidemia    Hyperthyroidism    hx 57 years old- oral meds   Iron deficiency anemia    Migraine headache    Pneumonia    x 2 - years ago   PONV (postoperative nausea and vomiting)    PPD positive    Pulmonary nodules    not seen on plain cxr, first detected 1/07 re ct 10/08 pos IPPD > 15 mm 12/09 FOB/lavage 04/20/08 neg afb smear   PVC's (premature ventricular contractions)    Seizure 11/15/2020   Sleep apnea    refused CPAP- mouth piece recommended   Tuberculosis    in her late 30's    ALLERGIES:  has No Known Allergies.  MEDICATIONS:  Current Outpatient Medications  Medication Sig Dispense Refill   amLODipine (NORVASC) 10 MG tablet  TAKE 1 TABLET BY MOUTH EVERY DAY 90 tablet 1   gabapentin (NEURONTIN) 100 MG capsule Take 1 capsule (100 mg total) by mouth 2 (two) times daily. 60 capsule 3   HYDROcodone bit-homatropine (HYCODAN) 5-1.5 MG/5ML syrup Take 5 mLs by mouth every 8 (eight) hours as needed for cough. 120 mL 0   lacosamide 100 MG TABS Take 1 tablet (100 mg total) by mouth 2 (two) times daily. 60 tablet 2   levETIRAcetam (KEPPRA) 750 MG tablet Take 2 tablets (1,500 mg total) by mouth 2 (two) times daily. 120 tablet 2   levothyroxine (SYNTHROID) 100 MCG tablet Take 1 tablet (100 mcg total) by mouth daily. 90 tablet 3   LORazepam (ATIVAN) 2 MG tablet Take 1 tablet (2 mg total) by mouth every 8 (eight) hours as needed for seizure. 12 tablet 0   ondansetron (ZOFRAN-ODT) 4 MG disintegrating tablet Take 1 tablet (4 mg total) by mouth every 8 (eight) hours as needed for nausea or vomiting. 10 tablet 0   Potassium Chloride ER 20 MEQ TBCR TAKE 1 TABLET BY MOUTH EVERY DAY 90 tablet 3   prochlorperazine (COMPAZINE) 10 MG tablet Take 10 mg by mouth every 6 (six) hours as needed for nausea or vomiting.     No current facility-administered medications for this visit.    VITAL SIGNS: There were no vitals taken for this  visit. There were no vitals filed for this visit.  Estimated body mass index is 27.57 kg/m as calculated from the following:   Height as of 08/06/22: 5\' 7"  (1.702 m).   Weight as of 08/06/22: 176 lb (79.8 kg).   PERFORMANCE STATUS (ECOG) : 1 - Symptomatic but completely ambulatory   Physical Exam General: NAD Head: Scarring from previous surgeries, left side of face swollen, left temple area slightly sunken, facial droop Neurological: alert and oriented x 4, appropriate affect Cardiovascular: regular rate and rhythm Pulmonary: clear ant fields Abdomen: soft, nontender, + bowel sounds Extremities: no joint deformities Skin: no rashes   IMPRESSION:   Pain: [name] endorses occasional, sharp pains to  the left side of her head near the temple area. States that, sometimes, the pain wraps around the crown of her head. Left side of face is slightly swollen. The left temple area, near site of previous surgery is sunken.   Pain episodes are infrequent and patient prefers to "try to relax" to get through them. She gently rubs her left jaw and temple or lies down for a nap until the pain passes.  Seizure Management:  Appetite:  Constipation:  Fatigue:    Goals of Care:  2/29- No changes were made to patient's regimen at this time. Time was spent building rapport and encouraging patient in the many healthy habits that she has in place. She verbalizes understanding to call Palliative team with any questions/needs/concerns.  We discussed [name]'s current illness and what it means in the larger context of Her on-going co-morbidities. Natural disease trajectory and expectations were discussed. We discussed the importance of continued conversation with family and their medical providers regarding overall plan of care and treatment options, ensuring decisions are within the context of the patients values and GOCs.  PLAN: Ongoing goals of care discussion. Spent time establishing therapeutic relationship.  No changes to medication regimen at this time.  Will continue to monitor closely and adjust regimen as needed. Follow-up in 2-3 weeks coordinated with upcoming oncology appointments.  Patient expressed understanding and was in agreement with this plan. She also understands that She can call the clinic at any time with any questions, concerns, or complaints.   Time Total: 40 min   Visit consisted of counseling and education dealing with the complex and emotionally intense issues of symptom management and palliative care in the setting of serious and potentially life-threatening illness.Greater than 50%  of this time was spent counseling and coordinating care related to the above assessment and  plan.   Willette Alma, AGPCNP-BC  Palliative Medicine Team/[facility]

## 2022-08-22 ENCOUNTER — Other Ambulatory Visit: Payer: Self-pay

## 2022-08-22 ENCOUNTER — Inpatient Hospital Stay (HOSPITAL_BASED_OUTPATIENT_CLINIC_OR_DEPARTMENT_OTHER): Payer: BC Managed Care – PPO | Admitting: Internal Medicine

## 2022-08-22 ENCOUNTER — Other Ambulatory Visit: Payer: Self-pay | Admitting: *Deleted

## 2022-08-22 ENCOUNTER — Inpatient Hospital Stay: Payer: BC Managed Care – PPO | Admitting: Nurse Practitioner

## 2022-08-22 ENCOUNTER — Inpatient Hospital Stay: Payer: BC Managed Care – PPO

## 2022-08-22 ENCOUNTER — Inpatient Hospital Stay: Payer: BC Managed Care – PPO | Attending: Internal Medicine

## 2022-08-22 VITALS — BP 142/94 | HR 97 | Resp 16

## 2022-08-22 VITALS — BP 157/98 | HR 100 | Temp 98.1°F | Resp 15 | Wt 170.4 lb

## 2022-08-22 DIAGNOSIS — Z90710 Acquired absence of both cervix and uterus: Secondary | ICD-10-CM | POA: Insufficient documentation

## 2022-08-22 DIAGNOSIS — Z5111 Encounter for antineoplastic chemotherapy: Secondary | ICD-10-CM | POA: Diagnosis not present

## 2022-08-22 DIAGNOSIS — C719 Malignant neoplasm of brain, unspecified: Secondary | ICD-10-CM | POA: Diagnosis not present

## 2022-08-22 DIAGNOSIS — R569 Unspecified convulsions: Secondary | ICD-10-CM | POA: Insufficient documentation

## 2022-08-22 DIAGNOSIS — E876 Hypokalemia: Secondary | ICD-10-CM | POA: Insufficient documentation

## 2022-08-22 DIAGNOSIS — R5383 Other fatigue: Secondary | ICD-10-CM | POA: Diagnosis not present

## 2022-08-22 DIAGNOSIS — D709 Neutropenia, unspecified: Secondary | ICD-10-CM | POA: Diagnosis not present

## 2022-08-22 DIAGNOSIS — Z79899 Other long term (current) drug therapy: Secondary | ICD-10-CM | POA: Insufficient documentation

## 2022-08-22 DIAGNOSIS — C712 Malignant neoplasm of temporal lobe: Secondary | ICD-10-CM | POA: Diagnosis not present

## 2022-08-22 DIAGNOSIS — Z800 Family history of malignant neoplasm of digestive organs: Secondary | ICD-10-CM | POA: Insufficient documentation

## 2022-08-22 DIAGNOSIS — Z801 Family history of malignant neoplasm of trachea, bronchus and lung: Secondary | ICD-10-CM | POA: Diagnosis not present

## 2022-08-22 LAB — CMP (CANCER CENTER ONLY)
ALT: 34 U/L (ref 0–44)
AST: 25 U/L (ref 15–41)
Albumin: 4.2 g/dL (ref 3.5–5.0)
Alkaline Phosphatase: 112 U/L (ref 38–126)
Anion gap: 10 (ref 5–15)
BUN: 5 mg/dL — ABNORMAL LOW (ref 6–20)
CO2: 26 mmol/L (ref 22–32)
Calcium: 9.9 mg/dL (ref 8.9–10.3)
Chloride: 107 mmol/L (ref 98–111)
Creatinine: 0.92 mg/dL (ref 0.44–1.00)
GFR, Estimated: >60 mL/min (ref >60)
Glucose, Bld: 140 mg/dL — ABNORMAL HIGH (ref 70–99)
Potassium: 3.1 mmol/L — ABNORMAL LOW (ref 3.5–5.1)
Sodium: 143 mmol/L (ref 135–145)
Total Bilirubin: 0.8 mg/dL (ref 0.3–1.2)
Total Protein: 7.3 g/dL (ref 6.5–8.1)

## 2022-08-22 LAB — CBC WITH DIFFERENTIAL (CANCER CENTER ONLY)
Abs Immature Granulocytes: 0.01 10*3/uL (ref 0.00–0.07)
Basophils Absolute: 0 10*3/uL (ref 0.0–0.1)
Basophils Relative: 1 %
Eosinophils Absolute: 0.1 10*3/uL (ref 0.0–0.5)
Eosinophils Relative: 2 %
HCT: 40.1 % (ref 36.0–46.0)
Hemoglobin: 13.1 g/dL (ref 12.0–15.0)
Immature Granulocytes: 0 %
Lymphocytes Relative: 33 %
Lymphs Abs: 0.8 10*3/uL (ref 0.7–4.0)
MCH: 30.6 pg (ref 26.0–34.0)
MCHC: 32.7 g/dL (ref 30.0–36.0)
MCV: 93.7 fL (ref 80.0–100.0)
Monocytes Absolute: 0.5 10*3/uL (ref 0.1–1.0)
Monocytes Relative: 19 %
Neutro Abs: 1.1 10*3/uL — ABNORMAL LOW (ref 1.7–7.7)
Neutrophils Relative %: 45 %
Platelet Count: 170 10*3/uL (ref 150–400)
RBC: 4.28 MIL/uL (ref 3.87–5.11)
RDW: 16.8 % — ABNORMAL HIGH (ref 11.5–15.5)
Smear Review: NORMAL
WBC Count: 2.5 10*3/uL — ABNORMAL LOW (ref 4.0–10.5)
nRBC: 0 % (ref 0.0–0.2)

## 2022-08-22 LAB — TOTAL PROTEIN, URINE DIPSTICK: Protein, ur: 30 mg/dL — AB

## 2022-08-22 MED ORDER — LORAZEPAM 2 MG/ML IJ SOLN
2.0000 mg | Freq: Once | INTRAMUSCULAR | Status: AC
  Administered 2022-08-22: 2 mg via INTRAVENOUS

## 2022-08-22 MED ORDER — POTASSIUM CHLORIDE IN NACL 20-0.9 MEQ/L-% IV SOLN
Freq: Once | INTRAVENOUS | Status: AC
  Filled 2022-08-22: qty 1000

## 2022-08-22 MED ORDER — LORAZEPAM 2 MG/ML IJ SOLN
INTRAMUSCULAR | Status: AC
  Filled 2022-08-22: qty 1

## 2022-08-22 MED ORDER — LEVETIRACETAM 500 MG PO TABS
1000.0000 mg | ORAL_TABLET | Freq: Once | ORAL | Status: AC
  Administered 2022-08-22: 1000 mg via ORAL
  Filled 2022-08-22: qty 2

## 2022-08-22 NOTE — Progress Notes (Signed)
Patient brought back to infusion room from waiting area. Patient was very lethargic, hard to arouse, reported that she felt like she was going to have a seizure. Patient placed in infusion chair and IV started. Patient began having convulsions. RN team kept patient safe and comfortable. Emotional support provided to family. Order for  IV ativan placed by Dr Barbaraann Cao and given. Lasted approximately 2-3 minutes. Patient became alert and responsive. Able to safely swallow ordered Keppra. RN will continue to monitor patient.

## 2022-08-22 NOTE — Progress Notes (Signed)
[facility] [address]  [place], [place] [address] [phone]   Interval Evaluation  Date of Service: 08/22/22 Patient Name: [name] Patient MRN: [id number] Patient DOB: 01/15/1966 Provider: Henreitta Leber, MD  Identifying Statement:  [name] is a 57 y.o. female with left temporal glioblastoma   Oncologic History: Oncology History  Glioblastoma with isocitrate dehydrogenase gene wildtype  11/21/2020 Initial Diagnosis   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)   11/21/2020 Cancer Staging   Staging form: Brain and Spinal Cord, AJCC 8th Edition - Pathologic stage from 11/21/2020: WHO Grade IV - Signed by Henreitta Leber, MD on 11/30/2020 Histopathologic type: Glioblastoma Stage prefix: Initial diagnosis Histologic grading system: 4 grade system Extent of surgical resection: Subtotal resection Karnofsky performance status: Score 90 Seizures at presentation: Present Duration of symptoms before diagnosis: Short IDH1 mutation: Negative   12/27/2020 - 02/07/2021 Radiation Therapy   IMRT and concurrent Temodar Basilio [place])   03/12/2021 - 08/26/2021 Chemotherapy   Completes 5 cycles of adjuvant 5-day Temodar   08/27/2021 Progression   POD #1   09/06/2021 -  Chemotherapy   Initiates second line therapy; CCNU /m2 PO q6 weeks and avastin /kg IV q2 weeks   12/28/2021 Progression   POD #2   01/17/2022 -  Chemotherapy   Third line therapy with Irinotecan and Avastin q2 weeks     Biomarkers:  MGMT Methylated.  IDH 1/2 Wild type.  EGFR Unknown  TERT Unknown   Interval History: [name] presents today for avastin and irinotecan infusion.  She is feeling under the weather today, fatigued and lethargic.  Felt fine yesterday and other days this week.  She did have a brief seizure over the weekend.  No new or progressive neurologic complaints today. Continues to have issues with fatigue,  unchanged from prior.  Does have fluctuations in language expression.    H+P (11/25/20) Patient presented to medical attention this past month with first ever seizure.  She describes episode of sudden onset "disconnection" with tongue biting and urination, followed by period of confusion.  CNS imaging demonstrated enhancing mass within left anterior temporal lobe, which was mostly resected by Dr. Maurice Small on 11/21/20.  Since surgery she had not had recurrence of seizures.  She complains of painful cramping of her right leg, which started ~1 week prior.  At this time the cramping is her main complaint.  Otherwise fully functional, independent, would like to return to work if possible.  Medications: Current Outpatient Medications on File Prior to Visit  Medication Sig Dispense Refill   amLODipine (NORVASC) 10 MG tablet TAKE 1 TABLET BY MOUTH EVERY DAY 90 tablet 1   HYDROcodone bit-homatropine (HYCODAN) 5-1.5 MG/5ML syrup Take 5 mLs by mouth every 8 (eight) hours as needed for cough. 120 mL 0   lacosamide 100 MG TABS Take 1 tablet (100 mg total) by mouth 2 (two) times daily. 60 tablet 2   levETIRAcetam (KEPPRA) 750 MG tablet Take 2 tablets (1,500 mg total) by mouth 2 (two) times daily. 120 tablet 2   levothyroxine (SYNTHROID) 100 MCG tablet Take 1 tablet (100 mcg total) by mouth daily. 90 tablet 3   LORazepam (ATIVAN) 2 MG tablet Take 1 tablet (2 mg total) by mouth every 8 (eight) hours as needed for seizure. 12 tablet 0   ondansetron (ZOFRAN-ODT) 4 MG disintegrating tablet Take 1 tablet (4 mg total) by mouth every 8 (eight) hours as needed for nausea or  vomiting. 10 tablet 0   Potassium Chloride ER 20 MEQ TBCR TAKE 1 TABLET BY MOUTH EVERY DAY 90 tablet 3   prochlorperazine (COMPAZINE) 10 MG tablet Take 10 mg by mouth every 6 (six) hours as needed for nausea or vomiting.     gabapentin (NEURONTIN) 100 MG capsule Take 1 capsule (100 mg total) by mouth 2 (two) times daily. (Patient not taking: Reported on  08/22/2022) 60 capsule 3   No current facility-administered medications on file prior to visit.    Allergies: No Known Allergies Past Medical History:  Past Medical History:  Diagnosis Date   Bell's palsy    left side of face- notied in smile and eyes, if very tired   Brain cancer    Complication of anesthesia    N/V   Essential hypertension    External hemorrhoids    History of blood transfusion    post op Hemorroids   History of pneumonia    Hyperlipidemia    Hyperthyroidism    hx 57 years old- oral meds   Iron deficiency anemia    Migraine headache    Pneumonia    x 2 - years ago   PONV (postoperative nausea and vomiting)    PPD positive    Pulmonary nodules    not seen on plain cxr, first detected 1/07 re ct 10/08 pos IPPD > 15 mm 12/09 FOB/lavage 04/20/08 neg afb smear   PVC's (premature ventricular contractions)    Seizure 11/15/2020   Sleep apnea    refused CPAP- mouth piece recommended   Tuberculosis    in her late 30's   Past Surgical History:  Past Surgical History:  Procedure Laterality Date   ABDOMINAL HYSTERECTOMY     APPLICATION OF CRANIAL NAVIGATION N/A 11/21/2020   Procedure: APPLICATION OF CRANIAL NAVIGATION;  Surgeon: Jadene Pierini, MD;  Location: [facility] OR;  Service: Neurosurgery;  Laterality: N/A;   BREAST BIOPSY Right 2014   BREAST BIOPSY Left 10/29/2019   CHOLECYSTECTOMY  2014   COLONOSCOPY  05/04/2009   prolapsing external hemorrhoids   CRANIOTOMY Left 11/21/2020   Procedure: Left craniotomy for tumor resection;  Surgeon: Jadene Pierini, MD;  Location: [facility] OR;  Service: Neurosurgery;  Laterality: Left;   ENDOMETRIAL ABLATION     HEMORRHOID SURGERY     x 3   TUBAL LIGATION     Social History:  Social History   Socioeconomic History   Marital status: Divorced    Spouse name: Not on file   Number of children: 2   Years of education: Not on file   Highest education level: Associate degree: occupational, Scientist, product/process development, or vocational  program  Occupational History   Occupation: accounts IT trainer: [employer]  Tobacco Use   Smoking status: Never   Smokeless tobacco: Never  Vaping Use   Vaping Use: Never used  Substance and Sexual Activity   Alcohol use: No    Alcohol/week: 0.0 standard drinks of alcohol   Drug use: No   Sexual activity: Not on file  Other Topics Concern   Not on file  Social History Narrative   Divorced with 2 children   She works as a Merchandiser, retail in Audiological scientist    Possible TB exposure 2008 (after nodules found)   From [place] lived there and [place]   Never owned birds      Social Determinants of Health   Financial Resource Strain: Medium Risk (06/13/2021)   Overall Financial Resource Strain (CARDIA)  Difficulty of Paying Living Expenses: Somewhat hard  Food Insecurity: Food Insecurity Present (04/23/2021)   Hunger Vital Sign    Worried About Running Out of Food in the Last Year: Sometimes true    Ran Out of Food in the Last Year: Never true  Transportation Needs: No Transportation Needs (06/13/2021)   PRAPARE - Administrator, Civil Service (Medical): No    Lack of Transportation (Non-Medical): No  Physical Activity: Unknown (04/23/2021)   Exercise Vital Sign    Days of Exercise per Week: 0 days    Minutes of Exercise per Session: Not on file  Stress: No Stress Concern Present (04/23/2021)   [employer] of Occupational Health - Occupational Stress Questionnaire    Feeling of Stress : Only a little  Social Connections: Moderately Integrated (04/23/2021)   Social Connection and Isolation Panel [NHANES]    Frequency of Communication with Friends and Family: Twice a week    Frequency of Social Gatherings with Friends and Family: Twice a week    Attends Religious Services: More than 4 times per year    Active Member of [employer] or Organizations: Yes    Attends Engineer, structural: More than 4 times per year    Marital Status: Divorced  Catering manager Violence:  Not on file   Family History:  Family History  Problem Relation Age of Onset   Diabetes type II Mother    Kidney failure Sister    Diabetes Mellitus II Sister    Colon cancer Maternal Aunt        aunts and uncles   Diabetes Mellitus II Maternal Aunt    Pancreatic cancer Maternal Aunt    Colon cancer Maternal Uncle    Diabetes Mellitus II Maternal Uncle    Heart disease Maternal Grandmother        aunts, uncles, sister   Diabetes Mellitus II Maternal Grandmother    Lung cancer Other        aunts and uncles    Review of Systems: Constitutional: poor appetite Eyes: blurriness of vision Ears, nose, mouth, throat, and face: Doesn't report sore throat Respiratory: Doesn't report cough, dyspnea or wheezes Cardiovascular: Doesn't report palpitation, chest discomfort  Gastrointestinal:  Doesn't report nausea, constipation, diarrhea GU: Doesn't report incontinence Skin: Doesn't report skin rashes Neurological: Per HPI Musculoskeletal: bilateral shoulder pain Behavioral/Psych: +anxiety  Physical Exam: Vitals:   08/22/22 1012  BP: (!) 157/98  Pulse: 100  Resp: 15  Temp: 98.1 F (36.7 C)  SpO2: 100%    KPS: 70. General: Alert, cooperative, pleasant, in no acute distress Head: Normal EENT: No conjunctival injection or scleral icterus.  Lungs: Resp effort normal Cardiac: Regular rate Abdomen: Non-distended abdomen Skin: No rashes cyanosis or petechiae. Extremities: No clubbing or edema  Neurologic Exam: Mental Status: Awake, alert, attentive to examiner. Oriented to self and environment. Language is notable for modest impairment in fluency.  Impaired short term recall. Cranial Nerves: Visual acuity is grossly normal. Visual fields are full. Extra-ocular movements intact. No ptosis. Face L LMN paresis, chronic. Motor: Tone and bulk are normal. Power is full in both arms and legs. Reflexes are symmetric, no pathologic reflexes present.  Sensory: Intact to light touch Gait:  Normal.   Labs: I have reviewed the data as listed    Component Value Date/Time   NA 142 08/01/2022 1136   K 3.6 08/01/2022 1136   CL 104 08/01/2022 1136   CO2 28 08/01/2022 1136   GLUCOSE 110 (H)  08/01/2022 1136   BUN 18 08/01/2022 1136   CREATININE 0.85 08/01/2022 1136   CREATININE 0.92 01/25/2020 1424   CALCIUM 9.7 08/01/2022 1136   PROT 7.2 08/01/2022 1136   ALBUMIN 4.3 08/01/2022 1136   AST 14 (L) 08/01/2022 1136   ALT 24 08/01/2022 1136   ALKPHOS 114 08/01/2022 1136   BILITOT 0.9 08/01/2022 1136   GFRNONAA >60 08/01/2022 1136   GFRNONAA 72 01/25/2020 1424   GFRAA 83 01/25/2020 1424   Lab Results  Component Value Date   WBC 2.5 (L) 08/22/2022   NEUTROABS PENDING 08/22/2022   HGB 13.1 08/22/2022   HCT 40.1 08/22/2022   MCV 93.7 08/22/2022   PLT 170 08/22/2022    Assessment/Plan Glioblastoma with isocitrate dehydrogenase gene wildtype [C71.9]  Alencia M Fetting is clinically stable today.  Because of neutropenia, we will defer today's infusion.  Will instead administer normal saline with Kcl given hypokalemia on CMP.  Will hold off on scheduled irinotecan 90mg /m2 and avastin 10mg /m2 today.  Chemotherapy should be held for the following:  ANC less than 1,000  Platelets less than 100,000  LFT or creatinine greater than 2x ULN  If clinical concerns/contraindications develop  Avastin should be held for the following:  ANC less than 500  Platelets less than 50,000  LFT or creatinine greater than 2x ULN  If clinical concerns/contraindications develop  For seizures, will con't Vimpat 100/100.  Keppra will con't 1500mg  BID.  Has discontinued decadron.  We ask that MATRICIA BEGNAUD return to clinic in 1 weeks prior to next scheduled infusion of CPT-11, Avastin and labs.  Next MRI  brain in 2-3 weeks.  All questions were answered. The patient knows to call the clinic with any problems, questions or concerns. No barriers to learning were  detected.  ADDENDUM: SAMAURI KELLENBERGER experienced a breakthrough seizure in infusion during IVF.  1000mg  Keppra and 2mg  IV ativan were adminstered, she was monitored for 1 hour prior to discharge to home.  The total time spent in the encounter was 40 minutes and more than 50% was on counseling and review of test results   Henreitta Leber, MD Medical Director of Neuro-Oncology [facility] 08/22/22 10:20 AM

## 2022-08-22 NOTE — Patient Instructions (Signed)
Hypokalemia Hypokalemia means that the amount of potassium in the blood is lower than normal. Potassium is a mineral (electrolyte) that helps regulate the amount of fluid in the body. It also stimulates muscle tightening (contraction) and helps nerves work properly. Normally, most of the body's potassium is inside cells, and only a very small amount is in the blood. Because the amount in the blood is so small, minor changes to potassium levels in the blood can be life-threatening. What are the causes? This condition may be caused by: Antibiotic medicine. Diarrhea or vomiting. Taking too much of a medicine that helps you have a bowel movement (laxative) can cause diarrhea and lead to hypokalemia. Chronic kidney disease (CKD). Medicines that help the body get rid of excess fluid (diuretics). Eating disorders, such as anorexia or bulimia. Low magnesium levels in the body. Sweating a lot. What are the signs or symptoms? Symptoms of this condition include: Weakness. Constipation. Fatigue. Muscle cramps. Mental confusion. Skipped heartbeats or irregular heartbeat (palpitations). Tingling or numbness. How is this diagnosed? This condition is diagnosed with a blood test. How is this treated? This condition may be treated by: Taking potassium supplements. Adjusting the medicines that you take. Eating more foods that contain a lot of potassium. If your potassium level is very low, you may need to get potassium through an IV and be monitored in the hospital. Follow these instructions at home: Eating and drinking  Eat a healthy diet. A healthy diet includes fresh fruits and vegetables, whole grains, healthy fats, and lean proteins. If told, eat more foods that contain a lot of potassium. These include: Nuts, such as peanuts and pistachios. Seeds, such as sunflower seeds and pumpkin seeds. Peas, lentils, and lima beans. Whole grain and bran cereals and breads. Fresh fruits and vegetables,  such as apricots, avocado, bananas, cantaloupe, kiwi, oranges, tomatoes, asparagus, and potatoes. Juices, such as orange, tomato, and prune. Lean meats, including fish. Milk and milk products, such as yogurt. General instructions Take over-the-counter and prescription medicines only as told by your health care provider. This includes vitamins, natural food products, and supplements. Keep all follow-up visits. This is important. Contact a health care provider if: You have weakness that gets worse. You feel your heart pounding or racing. You vomit. You have diarrhea. You have diabetes and you have trouble keeping your blood sugar in your target range. Get help right away if: You have chest pain. You have shortness of breath. You have vomiting or diarrhea that lasts for more than 2 days. You faint. These symptoms may be an emergency. Get help right away. Call 911. Do not wait to see if the symptoms will go away. Do not drive yourself to the hospital. Summary Hypokalemia means that the amount of potassium in the blood is lower than normal. This condition is diagnosed with a blood test. Hypokalemia may be treated by taking potassium supplements, adjusting the medicines that you take, or eating more foods that are high in potassium. If your potassium level is very low, you may need to get potassium through an IV and be monitored in the hospital. This information is not intended to replace advice given to you by your health care provider. Make sure you discuss any questions you have with your health care provider. Document Revised: 01/04/2021 Document Reviewed: 01/04/2021 Elsevier Patient Education  2023 Elsevier Inc.  

## 2022-08-22 NOTE — Progress Notes (Signed)
Pt to be seen by palliative today in infusion, due to her seizure and the medications given, palliative appointment will be rescheduled to another time. Team and pt/family made aware.

## 2022-08-27 ENCOUNTER — Telehealth: Payer: Self-pay | Admitting: Internal Medicine

## 2022-08-27 NOTE — Telephone Encounter (Signed)
Called patient regarding May appointments, patient is notified.  

## 2022-08-28 NOTE — Progress Notes (Unsigned)
Palliative Medicine [facility]  Telephone:([phone]) [phone] [phone]) [phone]   Name: [name] Date: 08/28/2022 MRN: [id number]  DOB: 05-Jan-1966  Patient Care Team: Shirline Frees, NP as PCP - General (Family Medicine) Trilby Leaver, MD as Referring Physician (Gastroenterology) Malachy Mood, MD as Consulting Physician (Oncology) Barbaraann Cao Georgeanna Lea, MD as Consulting Physician (Oncology)    INTERVAL HISTORY: [name] is a 57 y.o. female with medical history of glioblastoma s/p resection (11/2020) IMRT, currently on dexamethasone and Temodar, pancreatic lesion, hypertension, and hyperlipidemia.  Palliative ask to see for symptom management and goals of care.   SOCIAL HISTORY:    [name] reports that she has never smoked. She has never used smokeless tobacco. She reports that she does not drink alcohol and does not use drugs.  ADVANCE DIRECTIVES:  None on file  CODE STATUS: Full code  PAST MEDICAL HISTORY: Past Medical History:  Diagnosis Date   Bell's palsy    left side of face- notied in smile and eyes, if very tired   Brain cancer    Complication of anesthesia    N/V   Essential hypertension    External hemorrhoids    History of blood transfusion    post op Hemorroids   History of pneumonia    Hyperlipidemia    Hyperthyroidism    hx 57 years old- oral meds   Iron deficiency anemia    Migraine headache    Pneumonia    x 2 - years ago   PONV (postoperative nausea and vomiting)    PPD positive    Pulmonary nodules    not seen on plain cxr, first detected 1/07 re ct 10/08 pos IPPD > 15 mm 12/09 FOB/lavage 04/20/08 neg afb smear   PVC's (premature ventricular contractions)    Seizure 11/15/2020   Sleep apnea    refused CPAP- mouth piece recommended   Tuberculosis    in her late 30's    ALLERGIES:  has No Known Allergies.  MEDICATIONS:  Current Outpatient Medications  Medication Sig Dispense Refill   amLODipine (NORVASC) 10 MG  tablet TAKE 1 TABLET BY MOUTH EVERY DAY 90 tablet 1   gabapentin (NEURONTIN) 100 MG capsule Take 1 capsule (100 mg total) by mouth 2 (two) times daily. (Patient not taking: Reported on 08/22/2022) 60 capsule 3   HYDROcodone bit-homatropine (HYCODAN) 5-1.5 MG/5ML syrup Take 5 mLs by mouth every 8 (eight) hours as needed for cough. 120 mL 0   lacosamide 100 MG TABS Take 1 tablet (100 mg total) by mouth 2 (two) times daily. 60 tablet 2   levETIRAcetam (KEPPRA) 750 MG tablet Take 2 tablets (1,500 mg total) by mouth 2 (two) times daily. 120 tablet 2   levothyroxine (SYNTHROID) 100 MCG tablet Take 1 tablet (100 mcg total) by mouth daily. 90 tablet 3   LORazepam (ATIVAN) 2 MG tablet Take 1 tablet (2 mg total) by mouth every 8 (eight) hours as needed for seizure. 12 tablet 0   ondansetron (ZOFRAN-ODT) 4 MG disintegrating tablet Take 1 tablet (4 mg total) by mouth every 8 (eight) hours as needed for nausea or vomiting. 10 tablet 0   Potassium Chloride ER 20 MEQ TBCR TAKE 1 TABLET BY MOUTH EVERY DAY 90 tablet 3   prochlorperazine (COMPAZINE) 10 MG tablet Take 10 mg by mouth every 6 (six) hours as needed for nausea or vomiting.     No current facility-administered medications for this visit.    VITAL SIGNS:  There were no vitals taken for this visit. There were no vitals filed for this visit.  Estimated body mass index is 27 kg/m as calculated from the following:   Height as of 08/06/22:  (1.702 m).   Weight as of an earlier encounter on 08/29/22: 78.2 kg.   PERFORMANCE STATUS (ECOG) : 1 - Symptomatic but completely ambulatory   Physical Exam General: NAD Head: Scarring from previous surgeries, both sides of face swollen with right > left, left temple area slightly sunken, facial droop Neurological: alert and oriented x 4, restricted affect, slowed speech and some word-finding Cardiovascular: regular rate and rhythm Pulmonary: clear ant fields Abdomen: soft, nontender, + bowel  sounds Extremities: no joint deformities Skin: no rashes, pallor noted to fingers  IMPRESSION: Patient presents to this visit alone. She is alert and oriented. Affect is restricted as compared with previous visits.  Pain: [name] endorses intermittent, sharp pains to the left side of her head near the temple area. There are new lesions to the top of patient's head that she reports as being painful. Both areas she rates as 5 out of 10.  It remains important to [name] to try to get through these episodes without pain medication if possible. She is consistently taking her Gabapentin now and occasionally uses Tylenol. She prefers nonpharmacologic methods to relieve her pain, such as rubbing her head or taking a nap.  Seizure Management: [name] expresses being frustrated with recent episode of seizures. Keppra and Vimpat in place scheduled with Ativan available on an as-needed basis.  Appetite: [name] reports that her appetite is "okay." It fluctuates with some days being good and others being more difficult. Reports having had nausea twice since previous visit.    Goals of Care: [name] expresses the desire to live each day to its fullest. She is concerned that the new lesions on top of her head represent cancer spread. She expresses being frustrated with seizure episodes as well as a decreased ability to concentrate. Emotional support provided through active listening and quiet presence.   PLAN:  Will continue to monitor closely and adjust regimen as needed. Gabapentin twice daily Follow-up in 1-2 weeks coordinated with upcoming oncology appointments.  Patient expressed understanding and was in agreement with this plan. She also understands that She can call the clinic at any time with any questions, concerns, or complaints.   I assessed patient with Marylene Land, NP Student. Agree with above findings.   Visit consisted of counseling and education dealing with the complex and  emotionally intense issues of symptom management and palliative care in the setting of serious and potentially life-threatening illness.Greater than 50%  of this time was spent counseling and coordinating care related to the above assessment and plan.  Signed by: Angela Syphaseut  Willette Alma, AGPCNP-BC  Palliative Medicine Team/[facility]  *Please note that this is a verbal dictation therefore any spelling or grammatical errors are due to the "Dragon Medical One" system interpretation.

## 2022-08-29 ENCOUNTER — Inpatient Hospital Stay (HOSPITAL_BASED_OUTPATIENT_CLINIC_OR_DEPARTMENT_OTHER): Payer: BC Managed Care – PPO | Admitting: Nurse Practitioner

## 2022-08-29 ENCOUNTER — Other Ambulatory Visit: Payer: Self-pay

## 2022-08-29 ENCOUNTER — Encounter: Payer: Self-pay | Admitting: Nurse Practitioner

## 2022-08-29 ENCOUNTER — Ambulatory Visit (HOSPITAL_BASED_OUTPATIENT_CLINIC_OR_DEPARTMENT_OTHER): Payer: BC Managed Care – PPO | Admitting: Physician Assistant

## 2022-08-29 ENCOUNTER — Inpatient Hospital Stay: Payer: BC Managed Care – PPO

## 2022-08-29 ENCOUNTER — Inpatient Hospital Stay (HOSPITAL_BASED_OUTPATIENT_CLINIC_OR_DEPARTMENT_OTHER): Payer: BC Managed Care – PPO | Admitting: Internal Medicine

## 2022-08-29 VITALS — BP 146/93 | HR 96 | Temp 97.8°F | Resp 20 | Wt 172.4 lb

## 2022-08-29 VITALS — BP 136/87 | HR 92 | Temp 97.9°F | Resp 18

## 2022-08-29 DIAGNOSIS — Z79899 Other long term (current) drug therapy: Secondary | ICD-10-CM | POA: Diagnosis not present

## 2022-08-29 DIAGNOSIS — Z515 Encounter for palliative care: Secondary | ICD-10-CM | POA: Diagnosis not present

## 2022-08-29 DIAGNOSIS — D709 Neutropenia, unspecified: Secondary | ICD-10-CM | POA: Diagnosis not present

## 2022-08-29 DIAGNOSIS — C719 Malignant neoplasm of brain, unspecified: Secondary | ICD-10-CM

## 2022-08-29 DIAGNOSIS — R569 Unspecified convulsions: Secondary | ICD-10-CM | POA: Diagnosis not present

## 2022-08-29 DIAGNOSIS — Z5111 Encounter for antineoplastic chemotherapy: Secondary | ICD-10-CM | POA: Diagnosis not present

## 2022-08-29 DIAGNOSIS — Z800 Family history of malignant neoplasm of digestive organs: Secondary | ICD-10-CM | POA: Diagnosis not present

## 2022-08-29 DIAGNOSIS — C712 Malignant neoplasm of temporal lobe: Secondary | ICD-10-CM | POA: Diagnosis not present

## 2022-08-29 DIAGNOSIS — Z801 Family history of malignant neoplasm of trachea, bronchus and lung: Secondary | ICD-10-CM | POA: Diagnosis not present

## 2022-08-29 DIAGNOSIS — R5383 Other fatigue: Secondary | ICD-10-CM | POA: Diagnosis not present

## 2022-08-29 DIAGNOSIS — R530 Neoplastic (malignant) related fatigue: Secondary | ICD-10-CM | POA: Diagnosis not present

## 2022-08-29 DIAGNOSIS — G893 Neoplasm related pain (acute) (chronic): Secondary | ICD-10-CM | POA: Diagnosis not present

## 2022-08-29 DIAGNOSIS — Z90710 Acquired absence of both cervix and uterus: Secondary | ICD-10-CM | POA: Diagnosis not present

## 2022-08-29 DIAGNOSIS — E876 Hypokalemia: Secondary | ICD-10-CM | POA: Diagnosis not present

## 2022-08-29 LAB — CMP (CANCER CENTER ONLY)
ALT: 33 U/L (ref 0–44)
AST: 24 U/L (ref 15–41)
Albumin: 4.3 g/dL (ref 3.5–5.0)
Alkaline Phosphatase: 103 U/L (ref 38–126)
Anion gap: 8 (ref 5–15)
BUN: 8 mg/dL (ref 6–20)
CO2: 28 mmol/L (ref 22–32)
Calcium: 10.1 mg/dL (ref 8.9–10.3)
Chloride: 106 mmol/L (ref 98–111)
Creatinine: 0.87 mg/dL (ref 0.44–1.00)
GFR, Estimated: >60 mL/min (ref >60)
Glucose, Bld: 125 mg/dL — ABNORMAL HIGH (ref 70–99)
Potassium: 3.2 mmol/L — ABNORMAL LOW (ref 3.5–5.1)
Sodium: 142 mmol/L (ref 135–145)
Total Bilirubin: 0.9 mg/dL (ref 0.3–1.2)
Total Protein: 7.2 g/dL (ref 6.5–8.1)

## 2022-08-29 LAB — CBC WITH DIFFERENTIAL (CANCER CENTER ONLY)
Abs Immature Granulocytes: 0 10*3/uL (ref 0.00–0.07)
Basophils Absolute: 0 10*3/uL (ref 0.0–0.1)
Basophils Relative: 1 %
Eosinophils Absolute: 0 10*3/uL (ref 0.0–0.5)
Eosinophils Relative: 2 %
HCT: 39.8 % (ref 36.0–46.0)
Hemoglobin: 13.1 g/dL (ref 12.0–15.0)
Immature Granulocytes: 0 %
Lymphocytes Relative: 31 %
Lymphs Abs: 0.8 10*3/uL (ref 0.7–4.0)
MCH: 31 pg (ref 26.0–34.0)
MCHC: 32.9 g/dL (ref 30.0–36.0)
MCV: 94.3 fL (ref 80.0–100.0)
Monocytes Absolute: 0.4 10*3/uL (ref 0.1–1.0)
Monocytes Relative: 17 %
Neutro Abs: 1.3 10*3/uL — ABNORMAL LOW (ref 1.7–7.7)
Neutrophils Relative %: 49 %
Platelet Count: 181 10*3/uL (ref 150–400)
RBC: 4.22 MIL/uL (ref 3.87–5.11)
RDW: 16.9 % — ABNORMAL HIGH (ref 11.5–15.5)
Smear Review: NORMAL
WBC Count: 2.6 10*3/uL — ABNORMAL LOW (ref 4.0–10.5)
nRBC: 0 % (ref 0.0–0.2)

## 2022-08-29 MED ORDER — SODIUM CHLORIDE 0.9 % IV SOLN: Freq: Once | INTRAVENOUS | Status: DC | PRN

## 2022-08-29 MED ORDER — PALONOSETRON HCL INJECTION 0.25 MG/5ML
0.2500 mg | Freq: Once | INTRAVENOUS | Status: AC
  Administered 2022-08-29: 0.25 mg via INTRAVENOUS
  Filled 2022-08-29: qty 5

## 2022-08-29 MED ORDER — SODIUM CHLORIDE 0.9 % IV SOLN
10.0000 mg | Freq: Once | INTRAVENOUS | Status: AC
  Administered 2022-08-29: 10 mg via INTRAVENOUS
  Filled 2022-08-29: qty 10

## 2022-08-29 MED ORDER — FAMOTIDINE IN NACL 20-0.9 MG/50ML-% IV SOLN
20.0000 mg | Freq: Once | INTRAVENOUS | Status: AC | PRN
  Administered 2022-08-29: 20 mg via INTRAVENOUS

## 2022-08-29 MED ORDER — SODIUM CHLORIDE 0.9 % IV SOLN
10.0000 mg/kg | Freq: Once | INTRAVENOUS | Status: AC
  Administered 2022-08-29: 700 mg via INTRAVENOUS
  Filled 2022-08-29: qty 12

## 2022-08-29 MED ORDER — LEVETIRACETAM 1000 MG PO TABS: 2000.0000 mg | ORAL_TABLET | Freq: Two times a day (BID) | ORAL | 2 refills | Status: DC

## 2022-08-29 MED ORDER — METHYLPREDNISOLONE SODIUM SUCC 125 MG IJ SOLR
125.0000 mg | Freq: Once | INTRAMUSCULAR | Status: AC | PRN
  Administered 2022-08-29: 62 mg via INTRAVENOUS

## 2022-08-29 MED ORDER — SODIUM CHLORIDE 0.9 % IV SOLN
125.0000 mg/m2 | Freq: Once | INTRAVENOUS | Status: AC
  Administered 2022-08-29: 240 mg via INTRAVENOUS
  Filled 2022-08-29: qty 12

## 2022-08-29 MED ORDER — SODIUM CHLORIDE 0.9 % IV SOLN: Freq: Once | INTRAVENOUS | Status: AC

## 2022-08-29 MED ORDER — LORAZEPAM 2 MG/ML IJ SOLN
INTRAMUSCULAR | Status: AC
  Administered 2022-08-29: 2 mg
  Filled 2022-08-29: qty 1

## 2022-08-29 MED ORDER — ATROPINE SULFATE 1 MG/ML IV SOLN
0.5000 mg | Freq: Once | INTRAVENOUS | Status: AC | PRN
  Administered 2022-08-29: 0.5 mg via INTRAVENOUS
  Filled 2022-08-29: qty 1

## 2022-08-29 NOTE — Progress Notes (Signed)
[facility] [address]  [place], [place] [address] [phone]   Interval Evaluation  Date of Service: 08/29/22 Patient Name: ROSLYNN [name] Patient MRN: [id number] Patient DOB: [birth year] Provider: Henreitta Leber, MD  Identifying Statement:  [name] is a 57 y.o. female with left temporal glioblastoma   Oncologic History: Oncology History  Glioblastoma with isocitrate dehydrogenase gene wildtype  11/21/2020 Initial Diagnosis   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)   11/21/2020 Cancer Staging   Staging form: Brain and Spinal Cord, AJCC 8th Edition - Pathologic stage from 11/21/2020: WHO Grade IV - Signed by Henreitta Leber, MD on 11/30/2020 Histopathologic type: Glioblastoma Stage prefix: Initial diagnosis Histologic grading system: 4 grade system Extent of surgical resection: Subtotal resection Karnofsky performance status: Score 90 Seizures at presentation: Present Duration of symptoms before diagnosis: Short IDH1 mutation: Negative   12/27/2020 - 02/07/2021 Radiation Therapy   IMRT and concurrent Temodar Basilio [place])   03/12/2021 - 08/26/2021 Chemotherapy   Completes 5 cycles of adjuvant 5-day Temodar   08/27/2021 Progression   POD #1   09/06/2021 -  Chemotherapy   Initiates second line therapy; CCNU /m2 PO q6 weeks and avastin /kg IV q2 weeks   12/28/2021 Progression   POD #2   01/17/2022 -  Chemotherapy   Third line therapy with Irinotecan and Avastin q2 weeks     Biomarkers:  MGMT Methylated.  IDH 1/2 Wild type.  EGFR Unknown  TERT Unknown   Interval History: [name] presents today for avastin and irinotecan infusion.  Denies any further seizures after the episode in infusion last week.  No new or progressive neurologic complaints today. Short term memory impairment and fatigue are still persistent.  Does have fluctuations in language expression.    H+P (11/25/20)  Patient presented to medical attention this past month with first ever seizure.  She describes episode of sudden onset "disconnection" with tongue biting and urination, followed by period of confusion.  CNS imaging demonstrated enhancing mass within left anterior temporal lobe, which was mostly resected by Dr. Maurice Small on 11/21/20.  Since surgery she had not had recurrence of seizures.  She complains of painful cramping of her right leg, which started ~1 week prior.  At this time the cramping is her main complaint.  Otherwise fully functional, independent, would like to return to work if possible.  Medications: Current Outpatient Medications on File Prior to Visit  Medication Sig Dispense Refill   amLODipine (NORVASC) 10 MG tablet TAKE 1 TABLET BY MOUTH EVERY DAY 90 tablet 1   gabapentin (NEURONTIN) 100 MG capsule Take 1 capsule (100 mg total) by mouth 2 (two) times daily. (Patient not taking: Reported on 08/22/2022) 60 capsule 3   HYDROcodone bit-homatropine (HYCODAN) 5-1.5 MG/5ML syrup Take 5 mLs by mouth every 8 (eight) hours as needed for cough. 120 mL 0   lacosamide 100 MG TABS Take 1 tablet (100 mg total) by mouth 2 (two) times daily. 60 tablet 2   levETIRAcetam (KEPPRA) 750 MG tablet Take 2 tablets (1,500 mg total) by mouth 2 (two) times daily. 120 tablet 2   levothyroxine (SYNTHROID) 100 MCG tablet Take 1 tablet (100 mcg total) by mouth daily. 90 tablet 3   LORazepam (ATIVAN) 2 MG tablet Take 1 tablet (2 mg total) by mouth every 8 (eight) hours as needed for seizure. 12 tablet 0   ondansetron (ZOFRAN-ODT) 4 MG disintegrating tablet Take 1 tablet (4 mg total) by  mouth every 8 (eight) hours as needed for nausea or vomiting. 10 tablet 0   Potassium Chloride ER 20 MEQ TBCR TAKE 1 TABLET BY MOUTH EVERY DAY 90 tablet 3   prochlorperazine (COMPAZINE) 10 MG tablet Take 10 mg by mouth every 6 (six) hours as needed for nausea or vomiting.     No current facility-administered medications on file prior to  visit.    Allergies: No Known Allergies Past Medical History:  Past Medical History:  Diagnosis Date   Bell's palsy    left side of face- notied in smile and eyes, if very tired   Brain cancer    Complication of anesthesia    N/V   Essential hypertension    External hemorrhoids    History of blood transfusion    post op Hemorroids   History of pneumonia    Hyperlipidemia    Hyperthyroidism    hx 57 years old- oral meds   Iron deficiency anemia    Migraine headache    Pneumonia    x 2 - years ago   PONV (postoperative nausea and vomiting)    PPD positive    Pulmonary nodules    not seen on plain cxr, first detected 1/07 re ct 10/08 pos IPPD > 15 mm 12/09 FOB/lavage 04/20/08 neg afb smear   PVC's (premature ventricular contractions)    Seizure 11/15/2020   Sleep apnea    refused CPAP- mouth piece recommended   Tuberculosis    in her late 30's   Past Surgical History:  Past Surgical History:  Procedure Laterality Date   ABDOMINAL HYSTERECTOMY     APPLICATION OF CRANIAL NAVIGATION N/A 11/21/2020   Procedure: APPLICATION OF CRANIAL NAVIGATION;  Surgeon: Jadene Pierini, MD;  Location: [facility] OR;  Service: Neurosurgery;  Laterality: N/A;   BREAST BIOPSY Right 2014   BREAST BIOPSY Left 10/29/2019   CHOLECYSTECTOMY  2014   COLONOSCOPY  05/04/2009   prolapsing external hemorrhoids   CRANIOTOMY Left 11/21/2020   Procedure: Left craniotomy for tumor resection;  Surgeon: Jadene Pierini, MD;  Location: [facility] OR;  Service: Neurosurgery;  Laterality: Left;   ENDOMETRIAL ABLATION     HEMORRHOID SURGERY     x 3   TUBAL LIGATION     Social History:  Social History   Socioeconomic History   Marital status: Divorced    Spouse name: Not on file   Number of children: 2   Years of education: Not on file   Highest education level: Associate degree: occupational, Scientist, product/process development, or vocational program  Occupational History   Occupation: accounts IT trainer: [employer]   Tobacco Use   Smoking status: Never   Smokeless tobacco: Never  Vaping Use   Vaping Use: Never used  Substance and Sexual Activity   Alcohol use: No    Alcohol/week: 0.0 standard drinks of alcohol   Drug use: No   Sexual activity: Not on file  Other Topics Concern   Not on file  Social History Narrative   Divorced with 2 children   She works as a Merchandiser, retail in Audiological scientist    Possible TB exposure 2008 (after nodules found)   From [place] lived there and [place]   Never owned birds      Social Determinants of Health   Financial Resource Strain: Medium Risk (06/13/2021)   Overall Financial Resource Strain (CARDIA)    Difficulty of Paying Living Expenses: Somewhat hard  Food Insecurity: Food Insecurity Present (04/23/2021)   Hunger  Vital Sign    Worried About Programme researcher, broadcasting/film/video in the Last Year: Sometimes true    Ran Out of Food in the Last Year: Never true  Transportation Needs: No Transportation Needs (06/13/2021)   PRAPARE - Administrator, Civil Service (Medical): No    Lack of Transportation (Non-Medical): No  Physical Activity: Unknown (04/23/2021)   Exercise Vital Sign    Days of Exercise per Week: 0 days    Minutes of Exercise per Session: Not on file  Stress: No Stress Concern Present (04/23/2021)   [employer] of Occupational Health - Occupational Stress Questionnaire    Feeling of Stress : Only a little  Social Connections: Moderately Integrated (04/23/2021)   Social Connection and Isolation Panel [NHANES]    Frequency of Communication with Friends and Family: Twice a week    Frequency of Social Gatherings with Friends and Family: Twice a week    Attends Religious Services: More than 4 times per year    Active Member of [employer] or Organizations: Yes    Attends Engineer, structural: More than 4 times per year    Marital Status: Divorced  Catering manager Violence: Not on file   Family History:  Family History  Problem Relation Age of Onset    Diabetes type II Mother    Kidney failure Sister    Diabetes Mellitus II Sister    Colon cancer Maternal Aunt        aunts and uncles   Diabetes Mellitus II Maternal Aunt    Pancreatic cancer Maternal Aunt    Colon cancer Maternal Uncle    Diabetes Mellitus II Maternal Uncle    Heart disease Maternal Grandmother        aunts, uncles, sister   Diabetes Mellitus II Maternal Grandmother    Lung cancer Other        aunts and uncles    Review of Systems: Constitutional: poor appetite Eyes: blurriness of vision Ears, nose, mouth, throat, and face: Doesn't report sore throat Respiratory: Doesn't report cough, dyspnea or wheezes Cardiovascular: Doesn't report palpitation, chest discomfort  Gastrointestinal:  Doesn't report nausea, constipation, diarrhea GU: Doesn't report incontinence Skin: Doesn't report skin rashes Neurological: Per HPI Musculoskeletal: bilateral shoulder pain Behavioral/Psych: +anxiety  Physical Exam: Vitals:   08/29/22 0900  BP: (!) 146/93  Pulse: 96  Resp: 20  Temp: 97.8 F (36.6 C)  SpO2: 100%     KPS: 70. General: Alert, cooperative, pleasant, in no acute distress Head: Normal EENT: No conjunctival injection or scleral icterus.  Lungs: Resp effort normal Cardiac: Regular rate Abdomen: Non-distended abdomen Skin: No rashes cyanosis or petechiae. Extremities: No clubbing or edema  Neurologic Exam: Mental Status: Awake, alert, attentive to examiner. Oriented to self and environment. Language is notable for modest impairment in fluency.  Impaired short term recall. Cranial Nerves: Visual acuity is grossly normal. Visual fields are full. Extra-ocular movements intact. No ptosis. Face L LMN paresis, chronic. Motor: Tone and bulk are normal. Power is full in both arms and legs. Reflexes are symmetric, no pathologic reflexes present.  Sensory: Intact to light touch Gait: Normal.   Labs: I have reviewed the data as listed    Component Value  Date/Time   NA 143 08/22/2022 0945   K 3.1 (L) 08/22/2022 0945   CL 107 08/22/2022 0945   CO2 26 08/22/2022 0945   GLUCOSE 140 (H) 08/22/2022 0945   BUN 5 (L) 08/22/2022 0945   CREATININE 0.92 08/22/2022 0945  CREATININE 0.92 01/25/2020 1424   CALCIUM 9.9 08/22/2022 0945   PROT 7.3 08/22/2022 0945   ALBUMIN 4.2 08/22/2022 0945   AST 25 08/22/2022 0945   ALT 34 08/22/2022 0945   ALKPHOS 112 08/22/2022 0945   BILITOT 0.8 08/22/2022 0945   GFRNONAA >60 08/22/2022 0945   GFRNONAA 72 01/25/2020 1424   GFRAA 83 01/25/2020 1424   Lab Results  Component Value Date   WBC 2.5 (L) 08/22/2022   NEUTROABS 1.1 (L) 08/22/2022   HGB 13.1 08/22/2022   HCT 40.1 08/22/2022   MCV 93.7 08/22/2022   PLT 170 08/22/2022     Assessment/Plan Glioblastoma with isocitrate dehydrogenase gene wildtype [C71.9]  Gerri M Rother is clinically stable today.  Labs are notable for mild neutropenia.    Will recommend to proceed with scheduled irinotecan 90mg /m2 and avastin 10mg /m2 today.  May need to resume bone marrow support if neutrophils fall below 1.0.  Chemotherapy should be held for the following:  ANC less than 1,000  Platelets less than 100,000  LFT or creatinine greater than 2x ULN  If clinical concerns/contraindications develop  Avastin should be held for the following:  ANC less than 500  Platelets less than 50,000  LFT or creatinine greater than 2x ULN  If clinical concerns/contraindications develop  For seizures, will con't Vimpat 100/100.  Keppra will increase to 1000mg  BID given recent breakthrough seizures.  Will con't Potassium daily as well.  We ask that TOLUWANIMI RADEBAUGH return to clinic in 2 weeks prior to next scheduled infusion of CPT-11, Avastin and labs.  MRI brain scheduled for next week.  All questions were answered. The patient knows to call the clinic with any problems, questions or concerns. No barriers to learning were detected.  The total time  spent in the encounter was 30 minutes and more than 50% was on counseling and review of test results   Henreitta Leber, MD Medical Director of Neuro-Oncology [facility] [place] 08/29/22 8:58 AM

## 2022-08-29 NOTE — Patient Instructions (Signed)
[facility]  Discharge Instructions: Thank you for choosing [facility] to provide your oncology and hematology care.   If you have a lab appointment with the Cancer Center, please go directly to the Cancer Center and check in at the registration area.   Wear comfortable clothing and clothing appropriate for easy access to any Portacath or PICC line.   We strive to give you quality time with your provider. You may need to reschedule your appointment if you arrive late (15 or more minutes).  Arriving late affects you and other patients whose appointments are after yours.  Also, if you miss three or more appointments without notifying the office, you may be dismissed from the clinic at the provider's discretion.      For prescription refill requests, have your pharmacy contact our office and allow 72 hours for refills to be completed.    Today you received the following chemotherapy and/or immunotherapy agents: Bevacizumab, Irinotecan.      To help prevent nausea and vomiting after your treatment, we encourage you to take your nausea medication as directed.  BELOW ARE SYMPTOMS THAT SHOULD BE REPORTED IMMEDIATELY: *FEVER GREATER THAN 100.4 F (38 C) OR HIGHER *CHILLS OR SWEATING *NAUSEA AND VOMITING THAT IS NOT CONTROLLED WITH YOUR NAUSEA MEDICATION *UNUSUAL SHORTNESS OF BREATH *UNUSUAL BRUISING OR BLEEDING *URINARY PROBLEMS (pain or burning when urinating, or frequent urination) *BOWEL PROBLEMS (unusual diarrhea, constipation, pain near the anus) TENDERNESS IN MOUTH AND THROAT WITH OR WITHOUT PRESENCE OF ULCERS (sore throat, sores in mouth, or a toothache) UNUSUAL RASH, SWELLING OR PAIN  UNUSUAL VAGINAL DISCHARGE OR ITCHING   Items with * indicate a potential emergency and should be followed up as soon as possible or go to the Emergency Department if any problems should occur.  Please show the CHEMOTHERAPY ALERT CARD or IMMUNOTHERAPY  ALERT CARD at check-in to the Emergency Department and triage nurse.  Should you have questions after your visit or need to cancel or reschedule your appointment, please contact [facility]  Dept: [phone]  and follow the prompts.  Office hours are 8:00 a.m. to 4:30 p.m. Monday - Friday. Please note that voicemails left after 4:00 p.m. may not be returned until the following business day.  We are closed weekends and major holidays. You have access to a nurse at all times for urgent questions. Please call the main number to the clinic Dept: [phone] and follow the prompts.   For any non-urgent questions, you may also contact your provider using MyChart. We now offer e-Visits for anyone 18 and older to request care online for non-urgent symptoms. For details visit [email or web address].   Also download the MyChart app! Go to the app store, search "MyChart", open the app, select [place], and log in with your MyChart username and password.   

## 2022-08-29 NOTE — Progress Notes (Signed)
Called to infusion center to evaluate patient for her nausea.  She was 6 minutes into treatment when she developed nausea.  Treatment was paused and on my evaluation patient is anxious appearing.  She was given Pepcid 20 mg IV, Solu-Medrol 62 mg IV and 1 mg of Ativan.  Patient was observed and symptoms resolved. She was able to tolerate remainder of treatment without difficulty. This was not suggestive of hypersensitivity as patient frequently experiences nausea. Dr. Barbaraann Cao made aware,

## 2022-08-29 NOTE — Progress Notes (Signed)
Today patient had another episode of nausea during her irinotecan treatment. This required intervention due to some additional vital sign involvement. It was determined not to be truly an allergic reaction. Patient came in very anxious with some trembling. Patient was very unsure about her IV being placed and the idea of getting chemotherapy. Patient did well during her bevacizumab, but did mention that she though the irinotecan would make her nauseated. She had been given aloxi and her atropine. When irinotecan started patient was resting, but still anxious. About 5-6 minutes into her infusion Daphane Shepherd PA-C was called because patient began dry heaving into the trash can. Infusion had been stopped - see VS trend for BP and HR issues. She was nauseated, her tremors were exaggerated, she felt cold and was tachycardic with high diastolic blood pressure. Patient was given  IV pepcid, IVF,  of solumedrol and  of ativan before patient felt better. Patient was almost completely without treors, no longer felt overly cold and had stopped feeling nauseous. After some significant observation, patient's irinotecan was restarted with no further symptoms. Dr. Barbaraann Cao aware. Patient had previous issue with much milder response in March per notes. Patient reassured and told to discuss with Dr. Barbaraann Cao, but that she could take her compazine before treatment if she thought it would help. Patient VS were stable upon release and she was ambulatory to the lobby. Patient had a ride waiting to take her home. (See MAR for exact med administration.)

## 2022-09-04 ENCOUNTER — Telehealth: Payer: Self-pay

## 2022-09-05 ENCOUNTER — Encounter: Payer: Self-pay | Admitting: Internal Medicine

## 2022-09-05 ENCOUNTER — Ambulatory Visit (HOSPITAL_COMMUNITY)
Admission: RE | Admit: 2022-09-05 | Discharge: 2022-09-05 | Disposition: A | Discharge status: Home / Self Care | Payer: BC Managed Care – PPO | Source: Ambulatory Visit | Attending: Internal Medicine | Admitting: Internal Medicine

## 2022-09-05 DIAGNOSIS — C719 Malignant neoplasm of brain, unspecified: Secondary | ICD-10-CM

## 2022-09-05 DIAGNOSIS — H0520 Unspecified exophthalmos: Secondary | ICD-10-CM | POA: Diagnosis not present

## 2022-09-05 MED ORDER — GADOBUTROL 1 MMOL/ML IV SOLN
8.0000 mL | Freq: Once | INTRAVENOUS | Status: AC | PRN
  Administered 2022-09-05: 8 mL via INTRAVENOUS

## 2022-09-05 NOTE — Telephone Encounter (Signed)
Notified Patient of completion of Disability forms. Fax transmission confirmation received. Copy of forms mailed to Patient as requested. No other needs or concerns voiced at this time. Request for medical records forwarded to [facility] with signed Release of Information form.

## 2022-09-10 NOTE — Progress Notes (Unsigned)
Palliative Medicine [facility]  [phone]) [phone] [phone]   Name: [name] Date: 09/10/2022 MRN: [id number]  DOB: [birth year]  Patient Care Team: Shirline Frees, NP as PCP - General (Family Medicine) Trilby Leaver, MD as Referring Physician (Gastroenterology) Malachy Mood, MD as Consulting Physician (Oncology) Barbaraann Cao Georgeanna Lea, MD as Consulting Physician (Oncology)   I connected with [name] on 09/10/22 at  2:00 PM EDT by phone and verified that I am speaking with the correct person using two identifiers.   I discussed the limitations, risks, security and privacy concerns of performing an evaluation and management service by telemedicine and the availability of in-person appointments. I also discussed with the patient that there may be a patient responsible charge related to this service. The patient expressed understanding and agreed to proceed.   Other persons participating in the visit and their role in the encounter: n/a   [name] location: home  Provider's location: [facility]   Chief Complaint: f/u of symptom management   INTERVAL HISTORY: [name] is a 57 y.o. female with medical history of glioblastoma s/p resection (11/2020) IMRT, currently on dexamethasone and Temodar, pancreatic lesion, hypertension, and hyperlipidemia.  Palliative ask to see for symptom management and goals of care.   SOCIAL HISTORY:    [name] reports that she has never smoked. She has never used smokeless tobacco. She reports that she does not drink alcohol and does not use drugs.  ADVANCE DIRECTIVES:  None on file  CODE STATUS: Full code  PAST MEDICAL HISTORY: Past Medical History:  Diagnosis Date   Bell's palsy    left side of face- notied in smile and eyes, if very tired   Brain cancer ([name])    Complication of anesthesia    N/V   Essential hypertension    External hemorrhoids    History of blood transfusion    post op  Hemorroids   History of pneumonia    Hyperlipidemia    Hyperthyroidism    hx 57 years old- oral meds   Iron deficiency anemia    Migraine headache    Pneumonia    x 2 - years ago   PONV (postoperative nausea and vomiting)    PPD positive    Pulmonary nodules    not seen on plain cxr, first detected 1/07 re ct 10/08 pos IPPD > 15 mm 12/09 FOB/lavage 04/20/08 neg afb smear   PVC's (premature ventricular contractions)    Seizure ([name]) 11/15/2020   Sleep apnea    refused CPAP- mouth piece recommended   Tuberculosis    in her late 30's    ALLERGIES:  has No Known Allergies.  MEDICATIONS:  Current Outpatient Medications  Medication Sig Dispense Refill   amLODipine (NORVASC) 10 MG tablet TAKE 1 TABLET BY MOUTH EVERY DAY 90 tablet 1   gabapentin (NEURONTIN) 100 MG capsule Take 1 capsule (100 mg total) by mouth 2 (two) times daily. (Patient not taking: Reported on 08/22/2022) 60 capsule 3   HYDROcodone bit-homatropine (HYCODAN) 5-1.5 MG/5ML syrup Take 5 mLs by mouth every 8 (eight) hours as needed for cough. 120 mL 0   lacosamide 100 MG TABS Take 1 tablet (100 mg total) by mouth 2 (two) times daily. 60 tablet 2   levETIRAcetam (KEPPRA) 1000 MG tablet Take 2 tablets (2,000 mg total) by mouth 2 (two) times daily. 120 tablet 2   levothyroxine (SYNTHROID) 100 MCG tablet Take 1 tablet (100 mcg total) by mouth  daily. 90 tablet 3   LORazepam (ATIVAN) 2 MG tablet Take 1 tablet (2 mg total) by mouth every 8 (eight) hours as needed for seizure. 12 tablet 0   ondansetron (ZOFRAN-ODT) 4 MG disintegrating tablet Take 1 tablet (4 mg total) by mouth every 8 (eight) hours as needed for nausea or vomiting. 10 tablet 0   Potassium Chloride ER 20 MEQ TBCR TAKE 1 TABLET BY MOUTH EVERY DAY 90 tablet 3   prochlorperazine (COMPAZINE) 10 MG tablet Take 10 mg by mouth every 6 (six) hours as needed for nausea or vomiting.     No current facility-administered medications for this visit.    VITAL SIGNS: There  were no vitals taken for this visit. There were no vitals filed for this visit.  Estimated body mass index is 27 kg/m as calculated from the following:   Height as of 08/06/22: 5\' 7"  (1.702 m).   Weight as of 08/29/22: 172 lb 6.4 oz (78.2 kg).   PERFORMANCE STATUS (ECOG) : 1 - Symptomatic but completely ambulatory   Physical Exam General: NAD Head: Scarring from previous surgeries, both sides of face swollen with right > left, left temple area slightly sunken, facial droop Neurological: alert and oriented x 4, restricted affect, slowed speech and some word-finding Cardiovascular: regular rate and rhythm Pulmonary: clear ant fields Abdomen: soft, nontender, + bowel sounds Extremities: no joint deformities Skin: no rashes, pallor noted to fingers  IMPRESSION: Patient presents to this visit alone. She is alert and oriented. Affect is restricted as compared with previous visits.  Pain: [name] endorses intermittent, sharp pains to the left side of her head near the temple area. There are new lesions to the top of [name] head that she reports as being painful. Both areas she rates as 5 out of 10.  It remains important to [name] to try to get through these episodes without pain medication if possible. She is consistently taking her Gabapentin now and occasionally uses Tylenol. She prefers nonpharmacologic methods to relieve her pain, such as rubbing her head or taking a nap.  Seizure Management: [name] expresses being frustrated with recent episode of seizures. Keppra and Vimpat in place scheduled with Ativan available on an as-needed basis.  Appetite: [name] reports that her appetite is "okay." It fluctuates with some days being good and others being more difficult. Reports having had nausea twice since previous visit.    Goals of Care: [name] expresses the desire to live each day to its fullest. She is concerned that the new lesions on top of her head represent  cancer spread. She expresses being frustrated with seizure episodes as well as a decreased ability to concentrate. Emotional support provided through active listening and quiet presence.   PLAN:  Will continue to monitor closely and adjust regimen as needed. Gabapentin twice daily Follow-up in 1-2 weeks coordinated with upcoming oncology appointments.  Patient expressed understanding and was in agreement with this plan. She also understands that She can call the clinic at any time with any questions, concerns, or complaints.   I assessed patient with Marylene Land, NP Student. Agree with above findings.   Visit consisted of counseling and education dealing with the complex and emotionally intense issues of symptom management and palliative care in the setting of serious and potentially life-threatening illness.Greater than 50%  of this time was spent counseling and coordinating care related to the above assessment and plan.  Signed by: Angela Syphaseut  Willette Alma, AGPCNP-BC  Palliative  Medicine Team/[facility]  *Please note that this is a verbal dictation therefore any spelling or grammatical errors are due to the "Dragon Medical One" system interpretation.

## 2022-09-12 ENCOUNTER — Telehealth: Payer: Self-pay | Admitting: Internal Medicine

## 2022-09-12 ENCOUNTER — Encounter: Payer: Self-pay | Admitting: Nurse Practitioner

## 2022-09-12 ENCOUNTER — Inpatient Hospital Stay: Payer: BC Managed Care – PPO | Attending: Internal Medicine | Admitting: Nurse Practitioner

## 2022-09-12 DIAGNOSIS — C712 Malignant neoplasm of temporal lobe: Secondary | ICD-10-CM | POA: Insufficient documentation

## 2022-09-12 DIAGNOSIS — Z5111 Encounter for antineoplastic chemotherapy: Secondary | ICD-10-CM | POA: Insufficient documentation

## 2022-09-12 DIAGNOSIS — C719 Malignant neoplasm of brain, unspecified: Secondary | ICD-10-CM | POA: Diagnosis not present

## 2022-09-12 DIAGNOSIS — R530 Neoplastic (malignant) related fatigue: Secondary | ICD-10-CM | POA: Diagnosis not present

## 2022-09-12 DIAGNOSIS — G893 Neoplasm related pain (acute) (chronic): Secondary | ICD-10-CM

## 2022-09-12 DIAGNOSIS — Z515 Encounter for palliative care: Secondary | ICD-10-CM | POA: Diagnosis not present

## 2022-09-12 DIAGNOSIS — Z5189 Encounter for other specified aftercare: Secondary | ICD-10-CM | POA: Insufficient documentation

## 2022-09-17 ENCOUNTER — Other Ambulatory Visit: Payer: Self-pay | Admitting: Internal Medicine

## 2022-09-18 MED FILL — Dexamethasone Sodium Phosphate Inj 100 MG/10ML: INTRAMUSCULAR | Qty: 1 | Status: AC

## 2022-09-19 ENCOUNTER — Inpatient Hospital Stay: Payer: BC Managed Care – PPO

## 2022-09-19 ENCOUNTER — Other Ambulatory Visit: Payer: Self-pay

## 2022-09-19 ENCOUNTER — Inpatient Hospital Stay (HOSPITAL_BASED_OUTPATIENT_CLINIC_OR_DEPARTMENT_OTHER): Payer: BC Managed Care – PPO | Admitting: Internal Medicine

## 2022-09-19 VITALS — BP 133/90 | HR 88 | Resp 18

## 2022-09-19 VITALS — BP 148/100 | HR 92 | Temp 97.5°F | Resp 17 | Wt 171.0 lb

## 2022-09-19 DIAGNOSIS — C719 Malignant neoplasm of brain, unspecified: Secondary | ICD-10-CM | POA: Diagnosis not present

## 2022-09-19 DIAGNOSIS — Z5111 Encounter for antineoplastic chemotherapy: Secondary | ICD-10-CM | POA: Diagnosis not present

## 2022-09-19 DIAGNOSIS — C712 Malignant neoplasm of temporal lobe: Secondary | ICD-10-CM | POA: Diagnosis not present

## 2022-09-19 DIAGNOSIS — Z5189 Encounter for other specified aftercare: Secondary | ICD-10-CM | POA: Diagnosis not present

## 2022-09-19 LAB — CMP (CANCER CENTER ONLY)
ALT: 20 U/L (ref 0–44)
AST: 19 U/L (ref 15–41)
Albumin: 4.4 g/dL (ref 3.5–5.0)
Alkaline Phosphatase: 105 U/L (ref 38–126)
Anion gap: 9 (ref 5–15)
BUN: 10 mg/dL (ref 6–20)
CO2: 26 mmol/L (ref 22–32)
Calcium: 9.8 mg/dL (ref 8.9–10.3)
Chloride: 107 mmol/L (ref 98–111)
Creatinine: 1.03 mg/dL — ABNORMAL HIGH (ref 0.44–1.00)
GFR, Estimated: >60 mL/min (ref >60)
Glucose, Bld: 113 mg/dL — ABNORMAL HIGH (ref 70–99)
Potassium: 3.6 mmol/L (ref 3.5–5.1)
Sodium: 142 mmol/L (ref 135–145)
Total Bilirubin: 0.8 mg/dL (ref 0.3–1.2)
Total Protein: 7.1 g/dL (ref 6.5–8.1)

## 2022-09-19 LAB — CBC WITH DIFFERENTIAL (CANCER CENTER ONLY)
Abs Immature Granulocytes: 0 10*3/uL (ref 0.00–0.07)
Basophils Absolute: 0 10*3/uL (ref 0.0–0.1)
Basophils Relative: 1 %
Eosinophils Absolute: 0.1 10*3/uL (ref 0.0–0.5)
Eosinophils Relative: 6 %
HCT: 39.9 % (ref 36.0–46.0)
Hemoglobin: 12.6 g/dL (ref 12.0–15.0)
Immature Granulocytes: 0 %
Lymphocytes Relative: 34 %
Lymphs Abs: 0.8 10*3/uL (ref 0.7–4.0)
MCH: 30.2 pg (ref 26.0–34.0)
MCHC: 31.6 g/dL (ref 30.0–36.0)
MCV: 95.7 fL (ref 80.0–100.0)
Monocytes Absolute: 0.4 10*3/uL (ref 0.1–1.0)
Monocytes Relative: 15 %
Neutro Abs: 1.1 10*3/uL — ABNORMAL LOW (ref 1.7–7.7)
Neutrophils Relative %: 44 %
Platelet Count: 179 10*3/uL (ref 150–400)
RBC: 4.17 MIL/uL (ref 3.87–5.11)
RDW: 16.2 % — ABNORMAL HIGH (ref 11.5–15.5)
Smear Review: NORMAL
WBC Count: 2.4 10*3/uL — ABNORMAL LOW (ref 4.0–10.5)
nRBC: 0 % (ref 0.0–0.2)

## 2022-09-19 LAB — TOTAL PROTEIN, URINE DIPSTICK: Protein, ur: NEGATIVE mg/dL

## 2022-09-19 MED ORDER — SODIUM CHLORIDE 0.9 % IV SOLN
10.0000 mg/kg | Freq: Once | INTRAVENOUS | Status: AC
  Administered 2022-09-19: 700 mg via INTRAVENOUS
  Filled 2022-09-19: qty 12

## 2022-09-19 MED ORDER — ATROPINE SULFATE 1 MG/ML IV SOLN
0.5000 mg | Freq: Once | INTRAVENOUS | Status: AC | PRN
  Administered 2022-09-19: 0.5 mg via INTRAVENOUS
  Filled 2022-09-19: qty 1

## 2022-09-19 MED ORDER — LORAZEPAM 2 MG/ML IJ SOLN
1.0000 mg | Freq: Once | INTRAMUSCULAR | Status: AC
  Administered 2022-09-19: 1 mg via INTRAVENOUS
  Filled 2022-09-19: qty 1

## 2022-09-19 MED ORDER — IRINOTECAN HCL CHEMO INJECTION 100 MG/5ML
125.0000 mg/m2 | Freq: Once | INTRAVENOUS | Status: AC
  Administered 2022-09-19: 240 mg via INTRAVENOUS
  Filled 2022-09-19: qty 12

## 2022-09-19 MED ORDER — PALONOSETRON HCL INJECTION 0.25 MG/5ML
0.2500 mg | Freq: Once | INTRAVENOUS | Status: AC
  Administered 2022-09-19: 0.25 mg via INTRAVENOUS
  Filled 2022-09-19: qty 5

## 2022-09-19 MED ORDER — SODIUM CHLORIDE 0.9 % IV SOLN
10.0000 mg | Freq: Once | INTRAVENOUS | Status: AC
  Administered 2022-09-19: 10 mg via INTRAVENOUS
  Filled 2022-09-19: qty 10

## 2022-09-19 MED ORDER — SODIUM CHLORIDE 0.9 % IV SOLN: Freq: Once | INTRAVENOUS | Status: AC

## 2022-09-19 NOTE — Patient Instructions (Signed)
[facility]  Discharge Instructions: Thank you for choosing [facility] to provide your oncology and hematology care.   If you have a lab appointment with the Cancer Center, please go directly to the Cancer Center and check in at the registration area.   Wear comfortable clothing and clothing appropriate for easy access to any Portacath or PICC line.   We strive to give you quality time with your provider. You may need to reschedule your appointment if you arrive late (15 or more minutes).  Arriving late affects you and other patients whose appointments are after yours.  Also, if you miss three or more appointments without notifying the office, you may be dismissed from the clinic at the provider's discretion.      For prescription refill requests, have your pharmacy contact our office and allow 72 hours for refills to be completed.    Today you received the following chemotherapy and/or immunotherapy agents : Bebacizumab, Irinitecan      To help prevent nausea and vomiting after your treatment, we encourage you to take your nausea medication as directed.  BELOW ARE SYMPTOMS THAT SHOULD BE REPORTED IMMEDIATELY: *FEVER GREATER THAN 100.4 F (38 C) OR HIGHER *CHILLS OR SWEATING *NAUSEA AND VOMITING THAT IS NOT CONTROLLED WITH YOUR NAUSEA MEDICATION *UNUSUAL SHORTNESS OF BREATH *UNUSUAL BRUISING OR BLEEDING *URINARY PROBLEMS (pain or burning when urinating, or frequent urination) *BOWEL PROBLEMS (unusual diarrhea, constipation, pain near the anus) TENDERNESS IN MOUTH AND THROAT WITH OR WITHOUT PRESENCE OF ULCERS (sore throat, sores in mouth, or a toothache) UNUSUAL RASH, SWELLING OR PAIN  UNUSUAL VAGINAL DISCHARGE OR ITCHING   Items with * indicate a potential emergency and should be followed up as soon as possible or go to the Emergency Department if any problems should occur.  Please show the CHEMOTHERAPY ALERT CARD or IMMUNOTHERAPY  ALERT CARD at check-in to the Emergency Department and triage nurse.  Should you have questions after your visit or need to cancel or reschedule your appointment, please contact [facility]  Dept: [phone]  and follow the prompts.  Office hours are 8:00 a.m. to 4:30 p.m. Monday - Friday. Please note that voicemails left after 4:00 p.m. may not be returned until the following business day.  We are closed weekends and major holidays. You have access to a nurse at all times for urgent questions. Please call the main number to the clinic Dept: [phone] and follow the prompts.   For any non-urgent questions, you may also contact your provider using MyChart. We now offer e-Visits for anyone 53 and older to request care online for non-urgent symptoms. For details visit [email or web address].   Also download the MyChart app! Go to the app store, search "MyChart", open the app, select [place], and log in with your MyChart username and password.

## 2022-09-19 NOTE — Progress Notes (Signed)
[facility] [address]  [place], [place] [address] [phone]   Interval Evaluation  Date of Service: 09/19/22 Patient Name: [name] Patient MRN: [id number] Patient DOB: 06-Jul-1965 Provider: Henreitta Leber, MD  Identifying Statement:  [name] is a 57 y.o. 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)   11/21/2020 Cancer Staging   Staging form: Brain and Spinal Cord, AJCC 8th Edition - Pathologic stage from 11/21/2020: WHO Grade IV - Signed by Henreitta Leber, MD on 11/30/2020 Histopathologic type: Glioblastoma Stage prefix: Initial diagnosis Histologic grading system: 4 grade system Extent of surgical resection: Subtotal resection Karnofsky performance status: Score 90 Seizures at presentation: Present Duration of symptoms before diagnosis: Short IDH1 mutation: Negative   12/27/2020 - 02/07/2021 Radiation Therapy   IMRT and concurrent Temodar [facility] [place])   03/12/2021 - 08/26/2021 Chemotherapy   Completes 5 cycles of adjuvant 5-day Temodar   08/27/2021 Progression   POD #1   09/06/2021 -  Chemotherapy   Initiates second line therapy; CCNU 90mg /m2 PO q6 weeks and avastin 10mg /kg IV q2 weeks   12/28/2021 Progression   POD #2   01/17/2022 -  Chemotherapy   Third line therapy with Irinotecan and Avastin q2 weeks     Biomarkers:  MGMT Methylated.  IDH 1/2 Wild type.  EGFR Unknown  TERT Unknown   Interval History: [name] presents today for follow up after recent MRI brain.  Denies any further seizures since prior visit.  No new or progressive neurologic complaints today. Short term memory impairment and fatigue are still persistent.  Does have fluctuations in language expression.    H+P (11/25/20) Patient presented  to medical attention this past month with first ever seizure.  She describes episode of sudden onset "disconnection" with tongue biting and urination, followed by period of confusion.  CNS imaging demonstrated enhancing mass within left anterior temporal lobe, which was mostly resected by Dr. Maurice Small on 11/21/20.  Since surgery she had not had recurrence of seizures.  She complains of painful cramping of her right leg, which started ~1 week prior.  At this time the cramping is her main complaint.  Otherwise fully functional, independent, would like to return to work if possible.  Medications: Current Outpatient Medications on File Prior to Visit  Medication Sig Dispense Refill   amLODipine (NORVASC) 10 MG tablet TAKE 1 TABLET BY MOUTH EVERY DAY 90 tablet 1   gabapentin (NEURONTIN) 100 MG capsule Take 1 capsule (100 mg total) by mouth 2 (two) times daily. (Patient not taking: Reported on 08/22/2022) 60 capsule 3   HYDROcodone bit-homatropine (HYCODAN) 5-1.5 MG/5ML syrup Take 5 mLs by mouth every 8 (eight) hours as needed for cough. 120 mL 0   Lacosamide 100 MG TABS TAKE 1 TABLET BY MOUTH TWICE A DAY 60 tablet 2   levETIRAcetam (KEPPRA) 1000 MG tablet Take 2 tablets (2,000 mg total) by mouth 2 (two) times daily. 120 tablet 2   levothyroxine (SYNTHROID) 100 MCG tablet Take 1 tablet (100 mcg total) by mouth daily. 90 tablet 3   LORazepam (ATIVAN) 2 MG tablet Take 1 tablet (2 mg total) by mouth every 8 (eight) hours as needed for seizure. 12 tablet 0   ondansetron (ZOFRAN-ODT) 4 MG disintegrating tablet Take 1 tablet (4 mg total) by mouth every 8 (eight) hours  as needed for nausea or vomiting. 10 tablet 0   Potassium Chloride ER 20 MEQ TBCR TAKE 1 TABLET BY MOUTH EVERY DAY 90 tablet 3   prochlorperazine (COMPAZINE) 10 MG tablet Take 10 mg by mouth every 6 (six) hours as needed for nausea or vomiting.     No current facility-administered medications on file prior to visit.    Allergies: No Known  Allergies Past Medical History:  Past Medical History:  Diagnosis Date   Bell's palsy    left side of face- notied in smile and eyes, if very tired   Brain cancer ([name])    Complication of anesthesia    N/V   Essential hypertension    External hemorrhoids    History of blood transfusion    post op Hemorroids   History of pneumonia    Hyperlipidemia    Hyperthyroidism    hx 57 years old- oral meds   Iron deficiency anemia    Migraine headache    Pneumonia    x 2 - years ago   PONV (postoperative nausea and vomiting)    PPD positive    Pulmonary nodules    not seen on plain cxr, first detected 1/07 re ct 10/08 pos IPPD > 15 mm 12/09 FOB/lavage 04/20/08 neg afb smear   PVC's (premature ventricular contractions)    Seizure ([name]) 11/15/2020   Sleep apnea    refused CPAP- mouth piece recommended   Tuberculosis    in her late 30's   Past Surgical History:  Past Surgical History:  Procedure Laterality Date   ABDOMINAL HYSTERECTOMY     APPLICATION OF CRANIAL NAVIGATION N/A 11/21/2020   Procedure: APPLICATION OF CRANIAL NAVIGATION;  Surgeon: Jadene Pierini, MD;  Location: [facility] OR;  Service: Neurosurgery;  Laterality: N/A;   BREAST BIOPSY Right 2014   BREAST BIOPSY Left 10/29/2019   CHOLECYSTECTOMY  2014   COLONOSCOPY  05/04/2009   prolapsing external hemorrhoids   CRANIOTOMY Left 11/21/2020   Procedure: Left craniotomy for tumor resection;  Surgeon: Jadene Pierini, MD;  Location: [facility];  Service: Neurosurgery;  Laterality: Left;   ENDOMETRIAL ABLATION     HEMORRHOID SURGERY     x 3   TUBAL LIGATION     Social History:  Social History   Socioeconomic History   Marital status: Divorced    Spouse name: Not on file   Number of children: 2   Years of education: Not on file   Highest education level: Associate degree: occupational, Scientist, product/process development, or vocational program  Occupational History   Occupation: accounts IT trainer: [employer]  Tobacco Use   Smoking  status: Never   Smokeless tobacco: Never  Vaping Use   Vaping Use: Never used  Substance and Sexual Activity   Alcohol use: No    Alcohol/week: 0.0 standard drinks of alcohol   Drug use: No   Sexual activity: Not on file  Other Topics Concern   Not on file  Social History Narrative   Divorced with 2 children   She works as a Merchandiser, retail in Audiological scientist    Possible TB exposure 2008 (after nodules found)   From [place] lived there and [place]   Never owned birds      Social Determinants of Health   Financial Resource Strain: Medium Risk (06/13/2021)   Overall Financial Resource Strain (CARDIA)    Difficulty of Paying Living Expenses: Somewhat hard  Food Insecurity: Food Insecurity Present (04/23/2021)   Hunger Vital Sign  Worried About Programme researcher, broadcasting/film/video in the Last Year: Sometimes true    [employer] of Food in the Last Year: Never true  Transportation Needs: No Transportation Needs (06/13/2021)   PRAPARE - Administrator, Civil Service (Medical): No    Lack of Transportation (Non-Medical): No  Physical Activity: Unknown (04/23/2021)   Exercise Vital Sign    Days of Exercise per Week: 0 days    Minutes of Exercise per Session: Not on file  Stress: No Stress Concern Present (04/23/2021)   [employer] of Occupational Health - Occupational Stress Questionnaire    Feeling of Stress : Only a little  Social Connections: Moderately Integrated (04/23/2021)   Social Connection and Isolation Panel [NHANES]    Frequency of Communication with Friends and Family: Twice a week    Frequency of Social Gatherings with Friends and Family: Twice a week    Attends Religious Services: More than 4 times per year    Active Member of [employer] or Organizations: Yes    Attends Engineer, structural: More than 4 times per year    Marital Status: Divorced  Catering manager Violence: Not on file   Family History:  Family History  Problem Relation Age of Onset   Diabetes type II Mother     Kidney failure Sister    Diabetes Mellitus II Sister    Colon cancer Maternal Aunt        aunts and uncles   Diabetes Mellitus II Maternal Aunt    Pancreatic cancer Maternal Aunt    Colon cancer Maternal Uncle    Diabetes Mellitus II Maternal Uncle    Heart disease Maternal Grandmother        aunts, uncles, sister   Diabetes Mellitus II Maternal Grandmother    Lung cancer Other        aunts and uncles    Review of Systems: Constitutional: poor appetite Eyes: blurriness of vision Ears, nose, mouth, throat, and face: Doesn't report sore throat Respiratory: Doesn't report cough, dyspnea or wheezes Cardiovascular: Doesn't report palpitation, chest discomfort  Gastrointestinal:  Doesn't report nausea, constipation, diarrhea GU: Doesn't report incontinence Skin: Doesn't report skin rashes Neurological: Per HPI Musculoskeletal: bilateral shoulder pain Behavioral/Psych: +anxiety  Physical Exam: Vitals:   09/19/22 1101  BP: (!) 148/100  Pulse: 92  Resp: 17  Temp: (!) 97.5 F (36.4 C)  SpO2: 100%    KPS: 70. General: Alert, cooperative, pleasant, in no acute distress Head: Normal EENT: No conjunctival injection or scleral icterus.  Lungs: Resp effort normal Cardiac: Regular rate Abdomen: Non-distended abdomen Skin: No rashes cyanosis or petechiae. Extremities: No clubbing or edema  Neurologic Exam: Mental Status: Awake, alert, attentive to examiner. Oriented to self and environment. Language is notable for modest impairment in fluency.  Impaired short term recall. Cranial Nerves: Visual acuity is grossly normal. Visual fields are full. Extra-ocular movements intact. No ptosis. Face L LMN paresis, chronic. Motor: Tone and bulk are normal. Power is full in both arms and legs. Reflexes are symmetric, no pathologic reflexes present.  Sensory: Intact to light touch Gait: Normal.   Labs: I have reviewed the data as listed    Component Value Date/Time   NA 142 08/29/2022  0846   K 3.2 (L) 08/29/2022 0846   CL 106 08/29/2022 0846   CO2 28 08/29/2022 0846   GLUCOSE 125 (H) 08/29/2022 0846   BUN 8 08/29/2022 0846   CREATININE 0.87 08/29/2022 0846   CREATININE 0.92 01/25/2020 1424  CALCIUM 10.1 08/29/2022 0846   PROT 7.2 08/29/2022 0846   ALBUMIN 4.3 08/29/2022 0846   AST 24 08/29/2022 0846   ALT 33 08/29/2022 0846   ALKPHOS 103 08/29/2022 0846   BILITOT 0.9 08/29/2022 0846   GFRNONAA >60 08/29/2022 0846   GFRNONAA 72 01/25/2020 1424   GFRAA 83 01/25/2020 1424   Lab Results  Component Value Date   WBC 2.6 (L) 08/29/2022   NEUTROABS 1.3 (L) 08/29/2022   HGB 13.1 08/29/2022   HCT 39.8 08/29/2022   MCV 94.3 08/29/2022   PLT 181 08/29/2022    Imaging:  [facility] Clinician Interpretation: I have personally reviewed the CNS images as listed.  My interpretation, in the context of the patient's clinical presentation, is stable disease  MR BRAIN W WO CONTRAST  Result Date: 09/10/2022 CLINICAL DATA:  Follow-up glioblastoma EXAM: MRI HEAD WITHOUT AND WITH CONTRAST TECHNIQUE: Multiplanar, multiecho pulse sequences of the brain and surrounding structures were obtained without and with intravenous contrast. CONTRAST:  8mL GADAVIST GADOBUTROL 1 MMOL/ML IV SOLN COMPARISON:  07/04/2022 FINDINGS: Brain: Post treatment left cerebral hemisphere with resection at the anterior temporal lobe. Patchy restricted diffusion along the lower left lateral ventricle is non progressed with dominant nodular area measuring up to 2.5 cm in length along the left temporal horn, still showing heterogeneous enhancement on top of intrinsic T1 shortening. T2 hyperintensity throughout the left cerebral hemisphere is non progressed. No midline shift or change in mass effect. No acute infarct or hydrocephalus Vascular: Normal flow voids and vascular enhancement Skull and upper cervical spine: Unremarkable craniotomy site Sinuses/Orbits: Symmetric proptosis.  No retro-orbital mass. IMPRESSION: Stable  appearance of treated glioblastoma on the left. No new or progressive finding. Electronically Signed   By: Tiburcio Pea M.D.   On: 09/10/2022 06:19     Assessment/Plan Glioblastoma with isocitrate dehydrogenase gene wildtype [facility]) [C71.9]  [name] is clinically stable today.  MRI brain demonstrates stable findings. Labs are notable for ongoing modest neutropenia.    Will recommend to proceed with scheduled irinotecan 90mg /m2 and avastin 10mg /m2 today.  May need to resume bone marrow support if neutrophils fall below 1.0.  Chemotherapy should be held for the following:  ANC less than 1,000  Platelets less than 100,000  LFT or creatinine greater than 2x ULN  If clinical concerns/contraindications develop  Avastin should be held for the following:  ANC less than 500  Platelets less than 50,000  LFT or creatinine greater than 2x ULN  If clinical concerns/contraindications develop  For seizures, will con't Vimpat 100/100.  Keppra will also continue 1000mg  BID.  Will con't Potassium daily as well.  We ask that OLIVER MAIDONADO return to clinic in 3 weeks prior to next scheduled infusion of CPT-11, Avastin and labs.  Next MRI brain can be in 9 weeks.    All questions were answered. The patient knows to call the clinic with any problems, questions or concerns. No barriers to learning were detected.  The total time spent in the encounter was 40 minutes and more than 50% was on counseling and review of test results   Henreitta Leber, MD Medical Director of Neuro-Oncology [facility] 09/19/22 10:43 AM

## 2022-09-19 NOTE — Progress Notes (Signed)
Okay to treat with ANC 1.1 per Dr. Barbaraann Cao

## 2022-10-03 ENCOUNTER — Inpatient Hospital Stay: Payer: BC Managed Care – PPO | Admitting: Nurse Practitioner

## 2022-10-03 ENCOUNTER — Inpatient Hospital Stay: Payer: BC Managed Care – PPO

## 2022-10-03 ENCOUNTER — Inpatient Hospital Stay (HOSPITAL_BASED_OUTPATIENT_CLINIC_OR_DEPARTMENT_OTHER): Payer: BC Managed Care – PPO | Admitting: Internal Medicine

## 2022-10-03 ENCOUNTER — Other Ambulatory Visit: Payer: Self-pay | Admitting: *Deleted

## 2022-10-03 VITALS — Resp 16

## 2022-10-03 VITALS — BP 145/92 | HR 94 | Temp 97.5°F | Wt 167.5 lb

## 2022-10-03 DIAGNOSIS — Z5189 Encounter for other specified aftercare: Secondary | ICD-10-CM | POA: Diagnosis not present

## 2022-10-03 DIAGNOSIS — T451X5A Adverse effect of antineoplastic and immunosuppressive drugs, initial encounter: Secondary | ICD-10-CM

## 2022-10-03 DIAGNOSIS — R569 Unspecified convulsions: Secondary | ICD-10-CM | POA: Diagnosis not present

## 2022-10-03 DIAGNOSIS — C719 Malignant neoplasm of brain, unspecified: Secondary | ICD-10-CM

## 2022-10-03 DIAGNOSIS — Z5111 Encounter for antineoplastic chemotherapy: Secondary | ICD-10-CM | POA: Diagnosis not present

## 2022-10-03 DIAGNOSIS — C712 Malignant neoplasm of temporal lobe: Secondary | ICD-10-CM | POA: Diagnosis not present

## 2022-10-03 LAB — CBC WITH DIFFERENTIAL (CANCER CENTER ONLY)
Abs Immature Granulocytes: 0.02 10*3/uL (ref 0.00–0.07)
Basophils Absolute: 0 10*3/uL (ref 0.0–0.1)
Basophils Relative: 1 %
Eosinophils Absolute: 0.1 10*3/uL (ref 0.0–0.5)
Eosinophils Relative: 4 %
HCT: 36.6 % (ref 36.0–46.0)
Hemoglobin: 12.2 g/dL (ref 12.0–15.0)
Immature Granulocytes: 1 %
Lymphocytes Relative: 47 %
Lymphs Abs: 0.7 10*3/uL (ref 0.7–4.0)
MCH: 30.8 pg (ref 26.0–34.0)
MCHC: 33.3 g/dL (ref 30.0–36.0)
MCV: 92.4 fL (ref 80.0–100.0)
Monocytes Absolute: 0.2 10*3/uL (ref 0.1–1.0)
Monocytes Relative: 14 %
Neutro Abs: 0.5 10*3/uL — ABNORMAL LOW (ref 1.7–7.7)
Neutrophils Relative %: 33 %
Platelet Count: UNDETERMINED 10*3/uL (ref 150–400)
RBC: 3.96 MIL/uL (ref 3.87–5.11)
RDW: 15.7 % — ABNORMAL HIGH (ref 11.5–15.5)
WBC Count: 1.4 10*3/uL — ABNORMAL LOW (ref 4.0–10.5)
nRBC: 0 % (ref 0.0–0.2)

## 2022-10-03 LAB — CMP (CANCER CENTER ONLY)
ALT: 19 U/L (ref 0–44)
AST: 17 U/L (ref 15–41)
Albumin: 4.5 g/dL (ref 3.5–5.0)
Alkaline Phosphatase: 107 U/L (ref 38–126)
Anion gap: 8 (ref 5–15)
BUN: 6 mg/dL (ref 6–20)
CO2: 26 mmol/L (ref 22–32)
Calcium: 9.8 mg/dL (ref 8.9–10.3)
Chloride: 108 mmol/L (ref 98–111)
Creatinine: 0.87 mg/dL (ref 0.44–1.00)
GFR, Estimated: >60 mL/min (ref >60)
Glucose, Bld: 113 mg/dL — ABNORMAL HIGH (ref 70–99)
Potassium: 3.2 mmol/L — ABNORMAL LOW (ref 3.5–5.1)
Sodium: 142 mmol/L (ref 135–145)
Total Bilirubin: 0.7 mg/dL (ref 0.3–1.2)
Total Protein: 7.3 g/dL (ref 6.5–8.1)

## 2022-10-03 LAB — TOTAL PROTEIN, URINE DIPSTICK: Protein, ur: NEGATIVE mg/dL

## 2022-10-03 MED ORDER — FILGRASTIM-SNDZ 300 MCG/0.5ML IJ SOSY
300.0000 ug | PREFILLED_SYRINGE | Freq: Once | INTRAMUSCULAR | Status: AC
  Administered 2022-10-03: 300 ug via SUBCUTANEOUS
  Filled 2022-10-03: qty 0.5

## 2022-10-03 NOTE — Patient Instructions (Signed)
Filgrastim Injection What is this medication? FILGRASTIM (fil GRA stim) lowers the risk of infection in people who are receiving chemotherapy. It works by helping your body make more white blood cells, which protects your body from infection. It may also be used to help people who have been exposed to high doses of radiation. It can be used to help prepare your body before a stem cell transplant. It works by helping your bone marrow make and release stem cells into the blood. This medicine may be used for other purposes; ask your health care provider or pharmacist if you have questions. COMMON BRAND NAME(S): Neupogen, Nivestym, Releuko, Zarxio What should I tell my care team before I take this medication? They need to know if you have any of these conditions: History of blood diseases, such as sickle cell anemia Kidney disease Recent or ongoing radiation An unusual or allergic reaction to filgrastim, pegfilgrastim, latex, rubber, other medications, foods, dyes, or preservatives Pregnant or trying to get pregnant Breast-feeding How should I use this medication? This medication is injected under the skin or into a vein. It is usually given by your care team in a hospital or clinic setting. It may be given at home. If you get this medication at home, you will be taught how to prepare and give it. Use exactly as directed. Take it as directed on the prescription label at the same time every day. Keep taking it unless your care team tells you to stop. It is important that you put your used needles and syringes in a special sharps container. Do not put them in a trash can. If you do not have a sharps container, call your pharmacist or care team to get one. This medication comes with INSTRUCTIONS FOR USE. Ask your pharmacist for directions on how to use this medication. Read the information carefully. Talk to your pharmacist or care team if you have questions. Talk to your care team about the use of this  medication in children. While it may be prescribed for children for selected conditions, precautions do apply. Overdosage: If you think you have taken too much of this medicine contact a poison control center or emergency room at once. NOTE: This medicine is only for you. Do not share this medicine with others. What if I miss a dose? It is important not to miss any doses. Talk to your care team about what to do if you miss a dose. What may interact with this medication? Medications that may cause a release of neutrophils, such as lithium This list may not describe all possible interactions. Give your health care provider a list of all the medicines, herbs, non-prescription drugs, or dietary supplements you use. Also tell them if you smoke, drink alcohol, or use illegal drugs. Some items may interact with your medicine. What should I watch for while using this medication? Your condition will be monitored carefully while you are receiving this medication. You may need bloodwork while taking this medication. Talk to your care team about your risk of cancer. You may be more at risk for certain types of cancer if you take this medication. What side effects may I notice from receiving this medication? Side effects that you should report to your care team as soon as possible: Allergic reactions--skin rash, itching, hives, swelling of the face, lips, tongue, or throat Capillary leak syndrome--stomach or muscle pain, unusual weakness or fatigue, feeling faint or lightheaded, decrease in the amount of urine, swelling of the ankles, hands, or   feet, trouble breathing High white blood cell level--fever, fatigue, trouble breathing, night sweats, change in vision, weight loss Inflammation of the aorta--fever, fatigue, back, chest, or stomach pain, severe headache Kidney injury (glomerulonephritis)--decrease in the amount of urine, red or dark brown urine, foamy or bubbly urine, swelling of the ankles, hands, or  feet Shortness of breath or trouble breathing Spleen injury--pain in upper left stomach or shoulder Unusual bruising or bleeding Side effects that usually do not require medical attention (report to your care team if they continue or are bothersome): Back pain Bone pain Fatigue Fever Headache Nausea This list may not describe all possible side effects. Call your doctor for medical advice about side effects. You may report side effects to FDA at 1-800-FDA-1088. Where should I keep my medication? Keep out of the reach of children and pets. Keep this medication in the original packaging until you are ready to take it. Protect from light. See product for storage information. Each product may have different instructions. Get rid of any unused medication after the expiration date. To get rid of medications that are no longer needed or have expired: Take the medication to a medications take-back program. Check with your pharmacy or law enforcement to find a location. If you cannot return the medication, ask your pharmacist or care team how to get rid of this medication safely. NOTE: This sheet is a summary. It may not cover all possible information. If you have questions about this medicine, talk to your doctor, pharmacist, or health care provider.  2024 Elsevier/Gold Standard (2021-09-13 00:00:00)  

## 2022-10-03 NOTE — Progress Notes (Signed)
[facility] at [facility] [address]  [place], [place] [address] [phone]   Interval Evaluation  Date of Service: 10/03/22 Patient Name: [name] Patient MRN: [id number] Patient DOB: [birth year] Provider: Henreitta Leber, MD  Identifying Statement:  [name] is a 57 y.o. 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)   11/21/2020 Cancer Staging   Staging form: Brain and Spinal Cord, AJCC 8th Edition - Pathologic stage from 11/21/2020: WHO Grade IV - Signed by Henreitta Leber, MD on 11/30/2020 Histopathologic type: Glioblastoma Stage prefix: Initial diagnosis Histologic grading system: 4 grade system Extent of surgical resection: Subtotal resection Karnofsky performance status: Score 90 Seizures at presentation: Present Duration of symptoms before diagnosis: Short IDH1 mutation: Negative   12/27/2020 - 02/07/2021 Radiation Therapy   IMRT and concurrent Temodar [place]ilio [place])   03/12/2021 - 08/26/2021 Chemotherapy   Completes 5 cycles of adjuvant 5-day Temodar   08/27/2021 Progression   POD #1   09/06/2021 -  Chemotherapy   Initiates second line therapy; CCNU 90mg /m2 PO q6 weeks and avastin 10mg /kg IV q2 weeks   12/28/2021 Progression   POD #2   01/17/2022 -  Chemotherapy   Third line therapy with Irinotecan and Avastin q2 weeks     Biomarkers:  MGMT Methylated.  IDH 1/2 Wild type.  EGFR Unknown  TERT Unknown   Interval History: [name] presents today for avastin and irinotecan infusion.  Denies any further seizures since prior visit.  No new or progressive neurologic complaints today. Short term memory impairment and fatigue are still persistent.  Does have fluctuations in language expression.    H+P (11/25/20) Patient presented to  medical attention this past month with first ever seizure.  She describes episode of sudden onset "disconnection" with tongue biting and urination, followed by period of confusion.  CNS imaging demonstrated enhancing mass within left anterior temporal lobe, which was mostly resected by Dr. Maurice Small on 11/21/20.  Since surgery she had not had recurrence of seizures.  She complains of painful cramping of her right leg, which started ~1 week prior.  At this time the cramping is her main complaint.  Otherwise fully functional, independent, would like to return to work if possible.  Medications: Current Outpatient Medications on File Prior to Visit  Medication Sig Dispense Refill   amLODipine (NORVASC) 10 MG tablet TAKE 1 TABLET BY MOUTH EVERY DAY 90 tablet 1   gabapentin (NEURONTIN) 100 MG capsule Take 1 capsule (100 mg total) by mouth 2 (two) times daily. (Patient not taking: Reported on 08/22/2022) 60 capsule 3   HYDROcodone bit-homatropine (HYCODAN) 5-1.5 MG/5ML syrup Take 5 mLs by mouth every 8 (eight) hours as needed for cough. 120 mL 0   Lacosamide 100 MG TABS TAKE 1 TABLET BY MOUTH TWICE A DAY 60 tablet 2   levETIRAcetam (KEPPRA) 1000 MG tablet Take 2 tablets (2,000 mg total) by mouth 2 (two) times daily. 120 tablet 2   levothyroxine (SYNTHROID) 100 MCG tablet Take 1 tablet (100 mcg total) by mouth daily. 90 tablet 3   LORazepam (ATIVAN) 2 MG tablet Take 1 tablet (2 mg total) by mouth every 8 (eight) hours as needed for seizure. 12 tablet 0   ondansetron (ZOFRAN-ODT) 4 MG disintegrating tablet Take 1 tablet (4 mg total) by mouth every 8 (eight) hours as needed  for nausea or vomiting. 10 tablet 0   Potassium Chloride ER 20 MEQ TBCR TAKE 1 TABLET BY MOUTH EVERY DAY 90 tablet 3   prochlorperazine (COMPAZINE) 10 MG tablet Take 10 mg by mouth every 6 (six) hours as needed for nausea or vomiting.     No current facility-administered medications on file prior to visit.    Allergies: No Known  Allergies Past Medical History:  Past Medical History:  Diagnosis Date   Bell's palsy    left side of face- notied in smile and eyes, if very tired   Brain cancer ([name])    Complication of anesthesia    N/V   Essential hypertension    External hemorrhoids    History of blood transfusion    post op Hemorroids   History of pneumonia    Hyperlipidemia    Hyperthyroidism    hx 57 years old- oral meds   Iron deficiency anemia    Migraine headache    Pneumonia    x 2 - years ago   PONV (postoperative nausea and vomiting)    PPD positive    Pulmonary nodules    not seen on plain cxr, first detected 1/07 re ct 10/08 pos IPPD > 15 mm 12/09 FOB/lavage 04/20/08 neg afb smear   PVC's (premature ventricular contractions)    Seizure ([name]) 11/15/2020   Sleep apnea    refused CPAP- mouth piece recommended   Tuberculosis    in her late 30's   Past Surgical History:  Past Surgical History:  Procedure Laterality Date   ABDOMINAL HYSTERECTOMY     APPLICATION OF CRANIAL NAVIGATION N/A 11/21/2020   Procedure: APPLICATION OF CRANIAL NAVIGATION;  Surgeon: Jadene Pierini, MD;  Location: [facility] OR;  Service: Neurosurgery;  Laterality: N/A;   BREAST BIOPSY Right 2014   BREAST BIOPSY Left 10/29/2019   CHOLECYSTECTOMY  2014   COLONOSCOPY  05/04/2009   prolapsing external hemorrhoids   CRANIOTOMY Left 11/21/2020   Procedure: Left craniotomy for tumor resection;  Surgeon: Jadene Pierini, MD;  Location: [facility] OR;  Service: Neurosurgery;  Laterality: Left;   ENDOMETRIAL ABLATION     HEMORRHOID SURGERY     x 3   TUBAL LIGATION     Social History:  Social History   Socioeconomic History   Marital status: Divorced    Spouse name: Not on file   Number of children: 2   Years of education: Not on file   Highest education level: Associate degree: occupational, Scientist, product/process development, or vocational program  Occupational History   Occupation: accounts IT trainer: [employer]  Tobacco Use   Smoking  status: Never   Smokeless tobacco: Never  Vaping Use   Vaping Use: Never used  Substance and Sexual Activity   Alcohol use: No    Alcohol/week: 0.0 standard drinks of alcohol   Drug use: No   Sexual activity: Not on file  Other Topics Concern   Not on file  Social History Narrative   Divorced with 2 children   She works as a Merchandiser, retail in Audiological scientist    Possible TB exposure 2008 (after nodules found)   From [place] lived there and [place]   Never owned birds      Social Determinants of Health   Financial Resource Strain: Medium Risk (06/13/2021)   Overall Financial Resource Strain (CARDIA)    Difficulty of Paying Living Expenses: Somewhat hard  Food Insecurity: Food Insecurity Present (04/23/2021)   Hunger Vital Sign  Worried About Programme researcher, broadcasting/film/video in the Last Year: Sometimes true    [employer] of Food in the Last Year: Never true  Transportation Needs: No Transportation Needs (06/13/2021)   PRAPARE - Administrator, Civil Service (Medical): No    Lack of Transportation (Non-Medical): No  Physical Activity: Unknown (04/23/2021)   Exercise Vital Sign    Days of Exercise per Week: 0 days    Minutes of Exercise per Session: Not on file  Stress: No Stress Concern Present (04/23/2021)   [employer] of Occupational Health - Occupational Stress Questionnaire    Feeling of Stress : Only a little  Social Connections: Moderately Integrated (04/23/2021)   Social Connection and Isolation Panel [NHANES]    Frequency of Communication with Friends and Family: Twice a week    Frequency of Social Gatherings with Friends and Family: Twice a week    Attends Religious Services: More than 4 times per year    Active Member of [employer] or Organizations: Yes    Attends Engineer, structural: More than 4 times per year    Marital Status: Divorced  Catering manager Violence: Not on file   Family History:  Family History  Problem Relation Age of Onset   Diabetes type II Mother     Kidney failure Sister    Diabetes Mellitus II Sister    Colon cancer Maternal Aunt        aunts and uncles   Diabetes Mellitus II Maternal Aunt    Pancreatic cancer Maternal Aunt    Colon cancer Maternal Uncle    Diabetes Mellitus II Maternal Uncle    Heart disease Maternal Grandmother        aunts, uncles, sister   Diabetes Mellitus II Maternal Grandmother    Lung cancer Other        aunts and uncles    Review of Systems: Constitutional: poor appetite Eyes: blurriness of vision Ears, nose, mouth, throat, and face: Doesn't report sore throat Respiratory: Doesn't report cough, dyspnea or wheezes Cardiovascular: Doesn't report palpitation, chest discomfort  Gastrointestinal:  Doesn't report nausea, constipation, diarrhea GU: Doesn't report incontinence Skin: Doesn't report skin rashes Neurological: Per HPI Musculoskeletal: bilateral shoulder pain Behavioral/Psych: +anxiety  Physical Exam: There were no vitals filed for this visit.  KPS: 70. General: Alert, cooperative, pleasant, in no acute distress Head: Normal EENT: No conjunctival injection or scleral icterus.  Lungs: Resp effort normal Cardiac: Regular rate Abdomen: Non-distended abdomen Skin: No rashes cyanosis or petechiae. Extremities: No clubbing or edema  Neurologic Exam: Mental Status: Awake, alert, attentive to examiner. Oriented to self and environment. Language is notable for modest impairment in fluency.  Impaired short term recall. Cranial Nerves: Visual acuity is grossly normal. Visual fields are full. Extra-ocular movements intact. No ptosis. Face L LMN paresis, chronic. Motor: Tone and bulk are normal. Power is full in both arms and legs. Reflexes are symmetric, no pathologic reflexes present.  Sensory: Intact to light touch Gait: Normal.   Labs: I have reviewed the data as listed    Component Value Date/Time   NA 142 09/19/2022 1026   K 3.6 09/19/2022 1026   CL 107 09/19/2022 1026   CO2 26  09/19/2022 1026   GLUCOSE 113 (H) 09/19/2022 1026   BUN 10 09/19/2022 1026   CREATININE 1.03 (H) 09/19/2022 1026   CREATININE 0.92 01/25/2020 1424   CALCIUM 9.8 09/19/2022 1026   PROT 7.1 09/19/2022 1026   ALBUMIN 4.4 09/19/2022 1026  AST 19 09/19/2022 1026   ALT 20 09/19/2022 1026   ALKPHOS 105 09/19/2022 1026   BILITOT 0.8 09/19/2022 1026   GFRNONAA >60 09/19/2022 1026   GFRNONAA 72 01/25/2020 1424   GFRAA 83 01/25/2020 1424   Lab Results  Component Value Date   WBC 2.4 (L) 09/19/2022   NEUTROABS 1.1 (L) 09/19/2022   HGB 12.6 09/19/2022   HCT 39.9 09/19/2022   MCV 95.7 09/19/2022   PLT 179 09/19/2022     Assessment/Plan 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) [C71.9]  [name] is clinically stable today.  MRI brain demonstrates stable findings. Labs are notable for neutropenia.    Will recommend to hold scheduled irinotecan 90mg /m2 and avastin 10mg /m2 today.  Zarxio injection will be administered for hematologic support.  Can push planned infusion back 1 week.  Chemotherapy should be held for the following:  ANC less than 1,000  Platelets less than 100,000  LFT or creatinine greater than 2x ULN  If clinical concerns/contraindications develop  Avastin should be held for the following:  ANC less than 500  Platelets less than 50,000  LFT or creatinine greater than 2x ULN  If clinical concerns/contraindications develop  For seizures, will con't Vimpat 100/100.  Keppra will also continue 1000mg  BID.  Will con't Potassium daily as well.  We ask that [name] return to clinic in 1 weeks prior to next scheduled infusion of CPT-11, Avastin and labs.  Next MRI brain can be in 9 weeks.    All questions were answered. The patient knows to call the clinic with any problems, questions or concerns. No barriers to learning were detected.  The total time spent in the encounter was 30 minutes and more than 50% was on  counseling and review of test results   Henreitta Leber, MD Medical Director of Neuro-Oncology [facility] [place] [facility] 10/03/22 11:58 AM

## 2022-10-03 NOTE — Progress Notes (Deleted)
Palliative Medicine [facility]  [phone] [phone]   Name: [name] Date: 10/03/2022 MRN: [id number]  DOB: 1965-05-11  Patient Care Team: Shirline Frees, NP as PCP - General (Family Medicine) Trilby Leaver, MD as Referring Physician (Gastroenterology) Malachy Mood, MD as Consulting Physician (Oncology) Barbaraann Cao Georgeanna Lea, MD as Consulting Physician (Oncology)   INTERVAL HISTORY: [name] is a 57 y.o. female with medical history of glioblastoma s/p resection (11/2020) IMRT, currently on dexamethasone and Temodar, pancreatic lesion, hypertension, and hyperlipidemia.  Palliative ask to see for symptom management and goals of care.   SOCIAL HISTORY:    [name] reports that she has never smoked. She has never used smokeless tobacco. She reports that she does not drink alcohol and does not use drugs.  ADVANCE DIRECTIVES:  None on file  CODE STATUS: Full code  PAST MEDICAL HISTORY: Past Medical History:  Diagnosis Date   Bell's palsy    left side of face- notied in smile and eyes, if very tired   Brain cancer ([name])    Complication of anesthesia    N/V   Essential hypertension    External hemorrhoids    History of blood transfusion    post op Hemorroids   History of pneumonia    Hyperlipidemia    Hyperthyroidism    hx 57 years old- oral meds   Iron deficiency anemia    Migraine headache    Pneumonia    x 2 - years ago   PONV (postoperative nausea and vomiting)    PPD positive    Pulmonary nodules    not seen on plain cxr, first detected 1/07 re ct 10/08 pos IPPD > 15 mm 12/09 FOB/lavage 04/20/08 neg afb smear   PVC's (premature ventricular contractions)    Seizure ([name]) 11/15/2020   Sleep apnea    refused CPAP- mouth piece recommended   Tuberculosis    in her late 30's    ALLERGIES:  has No Known Allergies.  MEDICATIONS:  Current Outpatient Medications  Medication Sig Dispense Refill   amLODipine  (NORVASC) 10 MG tablet TAKE 1 TABLET BY MOUTH EVERY DAY 90 tablet 1   gabapentin (NEURONTIN) 100 MG capsule Take 1 capsule (100 mg total) by mouth 2 (two) times daily. (Patient not taking: Reported on 08/22/2022) 60 capsule 3   HYDROcodone bit-homatropine (HYCODAN) 5-1.5 MG/5ML syrup Take 5 mLs by mouth every 8 (eight) hours as needed for cough. 120 mL 0   Lacosamide 100 MG TABS TAKE 1 TABLET BY MOUTH TWICE A DAY 60 tablet 2   levETIRAcetam (KEPPRA) 1000 MG tablet Take 2 tablets (2,000 mg total) by mouth 2 (two) times daily. 120 tablet 2   levothyroxine (SYNTHROID) 100 MCG tablet Take 1 tablet (100 mcg total) by mouth daily. 90 tablet 3   LORazepam (ATIVAN) 2 MG tablet Take 1 tablet (2 mg total) by mouth every 8 (eight) hours as needed for seizure. 12 tablet 0   ondansetron (ZOFRAN-ODT) 4 MG disintegrating tablet Take 1 tablet (4 mg total) by mouth every 8 (eight) hours as needed for nausea or vomiting. 10 tablet 0   Potassium Chloride ER 20 MEQ TBCR TAKE 1 TABLET BY MOUTH EVERY DAY 90 tablet 3   prochlorperazine (COMPAZINE) 10 MG tablet Take 10 mg by mouth every 6 (six) hours as needed for nausea or vomiting.     No current facility-administered medications for this visit.    VITAL SIGNS: There were no  vitals taken for this visit. There were no vitals filed for this visit.  Estimated body mass index is 26.78 kg/m as calculated from the following:   Height as of 08/06/22: 5\' 7"  (1.702 m).   Weight as of 09/19/22: 171 lb (77.6 kg).   PERFORMANCE STATUS (ECOG) : 1 - Symptomatic but completely ambulatory   IMPRESSION:   Pain: [name] endorses intermittent, sharp pains to the left side of her head near the temple area. Pain is manageable. Some days are better than others.   It remains important to [name] to try to get through these episodes without pain medication if possible. She is consistently taking her Gabapentin and tolerating without difficulty. Occasional use of Tylenol. She  prefers nonpharmacologic methods to relieve her pain, such as rubbing her head or taking a nap.  Seizure Management:   Goals of Care: [name] expresses the desire to live each day to its fullest. She is concerned that the new lesions on top of her head represent cancer spread. She expresses being frustrated with seizure episodes as well as a decreased ability to concentrate. Emotional support provided through active listening and quiet presence.   PLAN:  Will continue to monitor closely and adjust regimen as needed. Gabapentin twice daily Follow-up in 2-3 weeks coordinated with upcoming oncology appointments.  Patient expressed understanding and was in agreement with this plan. She also understands that She can call the clinic at any time with any questions, concerns, or complaints.   Visit consisted of counseling and education dealing with the complex and emotionally intense issues of symptom management and palliative care in the setting of serious and potentially life-threatening illness.Greater than 50%  of this time was spent counseling and coordinating care related to the above assessment and plan.  Willette Alma, AGPCNP-BC  Palliative Medicine Team/[facility]  *Please note that this is a verbal dictation therefore any spelling or grammatical errors are due to the "Dragon Medical One" system interpretation.

## 2022-10-04 ENCOUNTER — Encounter: Payer: Self-pay | Admitting: Nurse Practitioner

## 2022-10-04 ENCOUNTER — Encounter: Payer: Self-pay | Admitting: Internal Medicine

## 2022-10-04 NOTE — Progress Notes (Signed)
erroneous

## 2022-10-05 ENCOUNTER — Other Ambulatory Visit: Payer: Self-pay | Admitting: Adult Health

## 2022-10-08 NOTE — Progress Notes (Unsigned)
Palliative Medicine [facility]  [phone]) [phone] [phone]) [phone]   Name: [name] Date: 10/08/2022 MRN: [id number]  DOB: 1966-02-01  Patient Care Team: Shirline Frees, NP as PCP - General (Family Medicine) Trilby Leaver, MD as Referring Physician (Gastroenterology) Malachy Mood, MD as Consulting Physician (Oncology) Barbaraann Cao Georgeanna Lea, MD as Consulting Physician (Oncology)    INTERVAL HISTORY: [name] is a 57 y.o. female with medical history of glioblastoma s/p resection (11/2020) IMRT, currently on dexamethasone and Temodar, pancreatic lesion, hypertension, and hyperlipidemia.  Palliative ask to see for symptom management and goals of care.   SOCIAL HISTORY:    [name] reports that she has never smoked. She has never used smokeless tobacco. She reports that she does not drink alcohol and does not use drugs.  ADVANCE DIRECTIVES:  None on file  CODE STATUS: Full code  PAST MEDICAL HISTORY: Past Medical History:  Diagnosis Date   Bell's palsy    left side of face- notied in smile and eyes, if very tired   Brain cancer ([name])    Complication of anesthesia    N/V   Essential hypertension    External hemorrhoids    History of blood transfusion    post op Hemorroids   History of pneumonia    Hyperlipidemia    Hyperthyroidism    hx 57 years old- oral meds   Iron deficiency anemia    Migraine headache    Pneumonia    x 2 - years ago   PONV (postoperative nausea and vomiting)    PPD positive    Pulmonary nodules    not seen on plain cxr, first detected 1/07 re ct 10/08 pos IPPD > 15 mm 12/09 FOB/lavage 04/20/08 neg afb smear   PVC's (premature ventricular contractions)    Seizure ([name]) 11/15/2020   Sleep apnea    refused CPAP- mouth piece recommended   Tuberculosis    in her late 30's    ALLERGIES:  has No Known Allergies.  MEDICATIONS:  Current Outpatient Medications  Medication Sig Dispense Refill   amLODipine  (NORVASC) 10 MG tablet TAKE 1 TABLET BY MOUTH EVERY DAY 90 tablet 1   HYDROcodone bit-homatropine (HYCODAN) 5-1.5 MG/5ML syrup Take 5 mLs by mouth every 8 (eight) hours as needed for cough. 120 mL 0   Lacosamide 100 MG TABS TAKE 1 TABLET BY MOUTH TWICE A DAY 60 tablet 2   levETIRAcetam (KEPPRA) 1000 MG tablet Take 2 tablets (2,000 mg total) by mouth 2 (two) times daily. 120 tablet 2   levothyroxine (SYNTHROID) 100 MCG tablet Take 1 tablet (100 mcg total) by mouth daily. 90 tablet 3   LORazepam (ATIVAN) 2 MG tablet Take 1 tablet (2 mg total) by mouth every 8 (eight) hours as needed for seizure. 12 tablet 0   ondansetron (ZOFRAN-ODT) 4 MG disintegrating tablet Take 1 tablet (4 mg total) by mouth every 8 (eight) hours as needed for nausea or vomiting. 10 tablet 0   Potassium Chloride ER 20 MEQ TBCR TAKE 1 TABLET BY MOUTH EVERY DAY 90 tablet 3   prochlorperazine (COMPAZINE) 10 MG tablet Take 10 mg by mouth every 6 (six) hours as needed for nausea or vomiting.     No current facility-administered medications for this visit.    VITAL SIGNS: There were no vitals taken for this visit. There were no vitals filed for this visit.  Estimated body mass index is 26.23 kg/m as calculated from the following:  Height as of 08/06/22: 5\' 7"  (1.702 m).   Weight as of 10/03/22: 167 lb 8 oz (76 kg).   PERFORMANCE STATUS (ECOG) : 1 - Symptomatic but completely ambulatory   IMPRESSION: I saw [name] during her infusion. No acute distress. Does endorse some ongoing fatigue.  Denies nausea, vomiting, constipation, or diarrhea.  Is trying to remain as active as possible.  Shares she is hopeful she can receive treatment today as previously delayed due to abnormal labs.  Reports appetite fluctuates.  Some days are better than others.  Current weight is 165 pounds this is down from 171 pounds on 5/16.  Will continue to closely monitor.   Seizure Management: Well-managed on current regimen including Ativan as  needed and Keppra.  Goals of Care: [name] expresses the desire to live each day to its fullest. She is concerned that the new lesions on top of her head represent cancer spread. She expresses being frustrated with seizure episodes as well as a decreased ability to concentrate. Emotional support provided through active listening and quiet presence.   PLAN: No symptom management needs at this time Will continue to monitor closely and adjust regimen as needed. Follow-up in 3-4 weeks coordinated with upcoming oncology appointments.  Patient knows to contact the office if needed.  Patient expressed understanding and was in agreement with this plan. She also understands that She can call the clinic at any time with any questions, concerns, or complaints.   Visit consisted of counseling and education dealing with the complex and emotionally intense issues of symptom management and palliative care in the setting of serious and potentially life-threatening illness.Greater than 50%  of this time was spent counseling and coordinating care related to the above assessment and plan.  [name], AGPCNP-BC  Palliative Medicine Team/[facility]  *Please note that this is a verbal dictation therefore any spelling or grammatical errors are due to the "Dragon Medical One" system interpretation.

## 2022-10-09 MED FILL — Dexamethasone Sodium Phosphate Inj 100 MG/10ML: INTRAMUSCULAR | Qty: 1 | Status: AC

## 2022-10-10 ENCOUNTER — Inpatient Hospital Stay (HOSPITAL_BASED_OUTPATIENT_CLINIC_OR_DEPARTMENT_OTHER): Payer: BC Managed Care – PPO | Admitting: Nurse Practitioner

## 2022-10-10 ENCOUNTER — Encounter: Payer: Self-pay | Admitting: Nurse Practitioner

## 2022-10-10 ENCOUNTER — Other Ambulatory Visit: Payer: BC Managed Care – PPO

## 2022-10-10 ENCOUNTER — Inpatient Hospital Stay (HOSPITAL_BASED_OUTPATIENT_CLINIC_OR_DEPARTMENT_OTHER): Payer: BC Managed Care – PPO | Admitting: Internal Medicine

## 2022-10-10 ENCOUNTER — Ambulatory Visit: Payer: BC Managed Care – PPO | Admitting: Internal Medicine

## 2022-10-10 ENCOUNTER — Inpatient Hospital Stay: Payer: Self-pay | Attending: Internal Medicine

## 2022-10-10 ENCOUNTER — Ambulatory Visit: Payer: BC Managed Care – PPO | Admitting: Hematology and Oncology

## 2022-10-10 ENCOUNTER — Inpatient Hospital Stay: Payer: Self-pay

## 2022-10-10 ENCOUNTER — Other Ambulatory Visit: Payer: Self-pay

## 2022-10-10 ENCOUNTER — Ambulatory Visit: Payer: BC Managed Care – PPO

## 2022-10-10 VITALS — BP 148/96 | HR 97 | Temp 98.4°F | Resp 18 | Ht 67.0 in | Wt 165.6 lb

## 2022-10-10 VITALS — BP 139/84 | HR 89

## 2022-10-10 DIAGNOSIS — Z5111 Encounter for antineoplastic chemotherapy: Secondary | ICD-10-CM | POA: Insufficient documentation

## 2022-10-10 DIAGNOSIS — C719 Malignant neoplasm of brain, unspecified: Secondary | ICD-10-CM

## 2022-10-10 DIAGNOSIS — R569 Unspecified convulsions: Secondary | ICD-10-CM

## 2022-10-10 DIAGNOSIS — Z515 Encounter for palliative care: Secondary | ICD-10-CM

## 2022-10-10 DIAGNOSIS — C712 Malignant neoplasm of temporal lobe: Secondary | ICD-10-CM | POA: Insufficient documentation

## 2022-10-10 DIAGNOSIS — R530 Neoplastic (malignant) related fatigue: Secondary | ICD-10-CM

## 2022-10-10 LAB — CMP (CANCER CENTER ONLY)
ALT: 23 U/L (ref 0–44)
AST: 20 U/L (ref 15–41)
Albumin: 4.5 g/dL (ref 3.5–5.0)
Alkaline Phosphatase: 113 U/L (ref 38–126)
Anion gap: 9 (ref 5–15)
BUN: 8 mg/dL (ref 6–20)
CO2: 26 mmol/L (ref 22–32)
Calcium: 10.3 mg/dL (ref 8.9–10.3)
Chloride: 105 mmol/L (ref 98–111)
Creatinine: 0.9 mg/dL (ref 0.44–1.00)
GFR, Estimated: >60 mL/min (ref >60)
Glucose, Bld: 119 mg/dL — ABNORMAL HIGH (ref 70–99)
Potassium: 3.2 mmol/L — ABNORMAL LOW (ref 3.5–5.1)
Sodium: 140 mmol/L (ref 135–145)
Total Bilirubin: 1.1 mg/dL (ref 0.3–1.2)
Total Protein: 7.4 g/dL (ref 6.5–8.1)

## 2022-10-10 LAB — CBC WITH DIFFERENTIAL (CANCER CENTER ONLY)
Abs Immature Granulocytes: 0 10*3/uL (ref 0.00–0.07)
Basophils Absolute: 0 10*3/uL (ref 0.0–0.1)
Basophils Relative: 0 %
Eosinophils Absolute: 0.1 10*3/uL (ref 0.0–0.5)
Eosinophils Relative: 2 %
HCT: 37.6 % (ref 36.0–46.0)
Hemoglobin: 12.2 g/dL (ref 12.0–15.0)
Lymphocytes Relative: 30 %
Lymphs Abs: 0.9 10*3/uL (ref 0.7–4.0)
MCH: 30.3 pg (ref 26.0–34.0)
MCHC: 32.4 g/dL (ref 30.0–36.0)
MCV: 93.5 fL (ref 80.0–100.0)
Monocytes Absolute: 0.6 10*3/uL (ref 0.1–1.0)
Monocytes Relative: 18 %
Neutro Abs: 1.6 10*3/uL — ABNORMAL LOW (ref 1.7–7.7)
Neutrophils Relative %: 50 %
Platelet Count: 174 10*3/uL (ref 150–400)
RBC: 4.02 MIL/uL (ref 3.87–5.11)
RDW: 16.4 % — ABNORMAL HIGH (ref 11.5–15.5)
Smear Review: NORMAL
WBC Count: 3.1 10*3/uL — ABNORMAL LOW (ref 4.0–10.5)
nRBC: 0 % (ref 0.0–0.2)

## 2022-10-10 LAB — TOTAL PROTEIN, URINE DIPSTICK: Protein, ur: NEGATIVE mg/dL

## 2022-10-10 MED ORDER — LORAZEPAM 2 MG/ML IJ SOLN
1.0000 mg | Freq: Once | INTRAMUSCULAR | Status: AC
  Administered 2022-10-10: 1 mg via INTRAVENOUS
  Filled 2022-10-10: qty 1

## 2022-10-10 MED ORDER — ATROPINE SULFATE 1 MG/ML IV SOLN
0.5000 mg | Freq: Once | INTRAVENOUS | Status: AC | PRN
  Administered 2022-10-10: 0.5 mg via INTRAVENOUS
  Filled 2022-10-10: qty 1

## 2022-10-10 MED ORDER — SODIUM CHLORIDE 0.9 % IV SOLN: Freq: Once | INTRAVENOUS | Status: AC

## 2022-10-10 MED ORDER — SODIUM CHLORIDE 0.9 % IV SOLN
125.0000 mg/m2 | Freq: Once | INTRAVENOUS | Status: AC
  Administered 2022-10-10: 240 mg via INTRAVENOUS
  Filled 2022-10-10: qty 12

## 2022-10-10 MED ORDER — SODIUM CHLORIDE 0.9 % IV SOLN
10.0000 mg/kg | Freq: Once | INTRAVENOUS | Status: AC
  Administered 2022-10-10: 700 mg via INTRAVENOUS
  Filled 2022-10-10: qty 12

## 2022-10-10 MED ORDER — SODIUM CHLORIDE 0.9 % IV SOLN
10.0000 mg | Freq: Once | INTRAVENOUS | Status: AC
  Administered 2022-10-10: 10 mg via INTRAVENOUS
  Filled 2022-10-10: qty 10

## 2022-10-10 MED ORDER — PALONOSETRON HCL INJECTION 0.25 MG/5ML
0.2500 mg | Freq: Once | INTRAVENOUS | Status: AC
  Administered 2022-10-10: 0.25 mg via INTRAVENOUS
  Filled 2022-10-10: qty 5

## 2022-10-10 NOTE — Progress Notes (Signed)
[facility] [address]  [place], [place] [address] [phone]   Interval Evaluation  Date of Service: 10/10/22 Patient Name: [name] Patient MRN: [id number] Patient DOB: 1965/12/13 Provider: Henreitta Leber, MD  Identifying Statement:  [name] is a 57 y.o. 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)   11/21/2020 Cancer Staging   Staging form: Brain and Spinal Cord, AJCC 8th Edition - Pathologic stage from 11/21/2020: WHO Grade IV - Signed by Henreitta Leber, MD on 11/30/2020 Histopathologic type: Glioblastoma Stage prefix: Initial diagnosis Histologic grading system: 4 grade system Extent of surgical resection: Subtotal resection Karnofsky performance status: Score 90 Seizures at presentation: Present Duration of symptoms before diagnosis: Short IDH1 mutation: Negative   12/27/2020 - 02/07/2021 Radiation Therapy   IMRT and concurrent Temodar [facility] [place])   03/12/2021 - 08/26/2021 Chemotherapy   Completes 5 cycles of adjuvant 5-day Temodar   08/27/2021 Progression   POD #1   09/06/2021 -  Chemotherapy   Initiates second line therapy; CCNU 90mg /m2 PO q6 weeks and avastin 10mg /kg IV q2 weeks   12/28/2021 Progression   POD #2   01/17/2022 -  Chemotherapy   Third line therapy with Irinotecan and Avastin q2 weeks     Biomarkers:  MGMT Methylated.  IDH 1/2 Wild type.  EGFR Unknown  TERT Unknown   Interval History: [name] presents today for avastin and irinotecan infusion.  Denies any further seizures since prior visit, granix injection.  No new or progressive neurologic complaints today. Short term memory impairment and fatigue are still persistent.  Fatigue continues to be a problem.    H+P (11/25/20) Patient  presented to medical attention this past month with first ever seizure.  She describes episode of sudden onset "disconnection" with tongue biting and urination, followed by period of confusion.  CNS imaging demonstrated enhancing mass within left anterior temporal lobe, which was mostly resected by Dr. Maurice Small on 11/21/20.  Since surgery she had not had recurrence of seizures.  She complains of painful cramping of her right leg, which started ~1 week prior.  At this time the cramping is her main complaint.  Otherwise fully functional, independent, would like to return to work if possible.  Medications: Current Outpatient Medications on File Prior to Visit  Medication Sig Dispense Refill   amLODipine (NORVASC) 10 MG tablet TAKE 1 TABLET BY MOUTH EVERY DAY 90 tablet 1   HYDROcodone bit-homatropine (HYCODAN) 5-1.5 MG/5ML syrup Take 5 mLs by mouth every 8 (eight) hours as needed for cough. 120 mL 0   Lacosamide 100 MG TABS TAKE 1 TABLET BY MOUTH TWICE A DAY 60 tablet 2   levETIRAcetam (KEPPRA) 1000 MG tablet Take 2 tablets (2,000 mg total) by mouth 2 (two) times daily. 120 tablet 2   levothyroxine (SYNTHROID) 100 MCG tablet Take 1 tablet (100 mcg total) by mouth daily. 90 tablet 3   LORazepam (ATIVAN) 2 MG tablet Take 1 tablet (2 mg total) by mouth every 8 (eight) hours as needed for seizure. 12 tablet 0   ondansetron (ZOFRAN-ODT) 4 MG disintegrating tablet Take 1 tablet (4 mg total) by mouth every 8 (eight) hours as needed for nausea or vomiting. 10 tablet 0   Potassium Chloride ER 20 MEQ TBCR TAKE 1 TABLET BY MOUTH EVERY DAY 90 tablet 3  prochlorperazine (COMPAZINE) 10 MG tablet Take 10 mg by mouth every 6 (six) hours as needed for nausea or vomiting.     No current facility-administered medications on file prior to visit.    Allergies: No Known Allergies Past Medical History:  Past Medical History:  Diagnosis Date   Bell's palsy    left side of face- notied in smile and eyes, if very tired    Brain cancer ([name])    Complication of anesthesia    N/V   Essential hypertension    External hemorrhoids    History of blood transfusion    post op Hemorroids   History of pneumonia    Hyperlipidemia    Hyperthyroidism    hx 57 years old- oral meds   Iron deficiency anemia    Migraine headache    Pneumonia    x 2 - years ago   PONV (postoperative nausea and vomiting)    PPD positive    Pulmonary nodules    not seen on plain cxr, first detected 1/07 re ct 10/08 pos IPPD > 15 mm 12/09 FOB/lavage 04/20/08 neg afb smear   PVC's (premature ventricular contractions)    Seizure ([name]) 11/15/2020   Sleep apnea    refused CPAP- mouth piece recommended   Tuberculosis    in her late 30's   Past Surgical History:  Past Surgical History:  Procedure Laterality Date   ABDOMINAL HYSTERECTOMY     APPLICATION OF CRANIAL NAVIGATION N/A 11/21/2020   Procedure: APPLICATION OF CRANIAL NAVIGATION;  Surgeon: Jadene Pierini, MD;  Location: [facility] OR;  Service: Neurosurgery;  Laterality: N/A;   BREAST BIOPSY Right 2014   BREAST BIOPSY Left 10/29/2019   CHOLECYSTECTOMY  2014   COLONOSCOPY  05/04/2009   prolapsing external hemorrhoids   CRANIOTOMY Left 11/21/2020   Procedure: Left craniotomy for tumor resection;  Surgeon: Jadene Pierini, MD;  Location: [facility] OR;  Service: Neurosurgery;  Laterality: Left;   ENDOMETRIAL ABLATION     HEMORRHOID SURGERY     x 3   TUBAL LIGATION     Social History:  Social History   Socioeconomic History   Marital status: Divorced    Spouse name: Not on file   Number of children: 2   Years of education: Not on file   Highest education level: Associate degree: occupational, Scientist, product/process development, or vocational program  Occupational History   Occupation: accounts IT trainer: [employer]  Tobacco Use   Smoking status: Never   Smokeless tobacco: Never  Vaping Use   Vaping Use: Never used  Substance and Sexual Activity   Alcohol use: No    Alcohol/week: 0.0  standard drinks of alcohol   Drug use: No   Sexual activity: Not on file  Other Topics Concern   Not on file  Social History Narrative   Divorced with 2 children   She works as a Merchandiser, retail in Audiological scientist    Possible TB exposure 2008 (after nodules found)   From [place] lived there and [place]   Never owned birds      Social Determinants of Health   Financial Resource Strain: Medium Risk (06/13/2021)   Overall Financial Resource Strain (CARDIA)    Difficulty of Paying Living Expenses: Somewhat hard  Food Insecurity: Food Insecurity Present (04/23/2021)   Hunger Vital Sign    Worried About Running Out of Food in the Last Year: Sometimes true    Ran Out of Food in the Last Year: Never true  Transportation  Needs: No Transportation Needs (06/13/2021)   PRAPARE - Administrator, Civil Service (Medical): No    Lack of Transportation (Non-Medical): No  Physical Activity: Unknown (04/23/2021)   Exercise Vital Sign    Days of Exercise per Week: 0 days    Minutes of Exercise per Session: Not on file  Stress: No Stress Concern Present (04/23/2021)   [employer] of Occupational Health - Occupational Stress Questionnaire    Feeling of Stress : Only a little  Social Connections: Moderately Integrated (04/23/2021)   Social Connection and Isolation Panel [NHANES]    Frequency of Communication with Friends and Family: Twice a week    Frequency of Social Gatherings with Friends and Family: Twice a week    Attends Religious Services: More than 4 times per year    Active Member of [name] or Organizations: Yes    Attends Engineer, structural: More than 4 times per year    Marital Status: Divorced  Catering manager Violence: Not on file   Family History:  Family History  Problem Relation Age of Onset   Diabetes type II Mother    Kidney failure Sister    Diabetes Mellitus II Sister    Colon cancer Maternal Aunt        aunts and uncles   Diabetes Mellitus II Maternal Aunt     Pancreatic cancer Maternal Aunt    Colon cancer Maternal Uncle    Diabetes Mellitus II Maternal Uncle    Heart disease Maternal Grandmother        aunts, uncles, sister   Diabetes Mellitus II Maternal Grandmother    Lung cancer Other        aunts and uncles    Review of Systems: Constitutional: poor appetite Eyes: blurriness of vision Ears, nose, mouth, throat, and face: Doesn't report sore throat Respiratory: Doesn't report cough, dyspnea or wheezes Cardiovascular: Doesn't report palpitation, chest discomfort  Gastrointestinal:  Doesn't report nausea, constipation, diarrhea GU: Doesn't report incontinence Skin: Doesn't report skin rashes Neurological: Per HPI Musculoskeletal: bilateral shoulder pain Behavioral/Psych: +anxiety  Physical Exam: Vitals:   10/10/22 1120  BP: (!) 148/96  Pulse: 97  Resp: 18  Temp: 98.4 F (36.9 C)  SpO2: 100%    KPS: 70. General: Alert, cooperative, pleasant, in no acute distress Head: Normal EENT: No conjunctival injection or scleral icterus.  Lungs: Resp effort normal Cardiac: Regular rate Abdomen: Non-distended abdomen Skin: No rashes cyanosis or petechiae. Extremities: No clubbing or edema  Neurologic Exam: Mental Status: Awake, alert, attentive to examiner. Oriented to self and environment. Language is notable for modest impairment in fluency.  Impaired short term recall. Cranial Nerves: Visual acuity is grossly normal. Visual fields are full. Extra-ocular movements intact. No ptosis. Face L LMN paresis, chronic. Motor: Tone and bulk are normal. Power is full in both arms and legs. Reflexes are symmetric, no pathologic reflexes present.  Sensory: Intact to light touch Gait: Normal.   Labs: I have reviewed the data as listed    Component Value Date/Time   NA 142 10/03/2022 1127   K 3.2 (L) 10/03/2022 1127   CL 108 10/03/2022 1127   CO2 26 10/03/2022 1127   GLUCOSE 113 (H) 10/03/2022 1127   BUN 6 10/03/2022 1127    CREATININE 0.87 10/03/2022 1127   CREATININE 0.92 01/25/2020 1424   CALCIUM 9.8 10/03/2022 1127   PROT 7.3 10/03/2022 1127   ALBUMIN 4.5 10/03/2022 1127   AST 17 10/03/2022 1127   ALT 19  10/03/2022 1127   ALKPHOS 107 10/03/2022 1127   BILITOT 0.7 10/03/2022 1127   GFRNONAA >60 10/03/2022 1127   GFRNONAA 72 01/25/2020 1424   GFRAA 83 01/25/2020 1424   Lab Results  Component Value Date   WBC 3.1 (L) 10/10/2022   NEUTROABS 1.6 (L) 10/10/2022   HGB 12.2 10/10/2022   HCT 37.6 10/10/2022   MCV 93.5 10/10/2022   PLT 174 10/10/2022     Assessment/Plan Focal seizures ([name])  Glioblastoma with isocitrate dehydrogenase gene wildtype [facility]) - Plan: Clinic Appointment Request  [name] is clinically stable today. Labs demonstrate improvement in neutropenia following granix administration last week.    Will recommend to resume irinotecan 90mg /m2 and avastin 10mg /m2 today.    Chemotherapy should be held for the following:  ANC less than 1,000  Platelets less than 100,000  LFT or creatinine greater than 2x ULN  If clinical concerns/contraindications develop  Avastin should be held for the following:  ANC less than 500  Platelets less than 50,000  LFT or creatinine greater than 2x ULN  If clinical concerns/contraindications develop  For seizures, will con't Vimpat 100/100.  Keppra will also continue 1000mg  BID.  Will con't Potassium daily as well.  We ask that [name] return to clinic in 3 weeks prior to next scheduled infusion of CPT-11, Avastin and labs.  Next MRI requested for 11/14/22.  All questions were answered. The patient knows to call the clinic with any problems, questions or concerns. No barriers to learning were detected.  The total time spent in the encounter was 30 minutes and more than 50% was on counseling and review of test results   Henreitta Leber, MD Medical Director of Neuro-Oncology [facility] [place]  [place] 10/10/22 11:36 AM

## 2022-10-10 NOTE — Patient Instructions (Signed)
[place] [facility]  Discharge Instructions: Thank you for choosing [facility] to provide your oncology and hematology care.   If you have a lab appointment with the Cancer Center, please go directly to the Cancer Center and check in at the registration area.   Wear comfortable clothing and clothing appropriate for easy access to any Portacath or PICC line.   We strive to give you quality time with your provider. You may need to reschedule your appointment if you arrive late (15 or more minutes).  Arriving late affects you and other patients whose appointments are after yours.  Also, if you miss three or more appointments without notifying the office, you may be dismissed from the clinic at the provider's discretion.      For prescription refill requests, have your pharmacy contact our office and allow 72 hours for refills to be completed.    Today you received the following chemotherapy and/or immunotherapy agents: Bevacizumab, Irinotecan.      To help prevent nausea and vomiting after your treatment, we encourage you to take your nausea medication as directed.  BELOW ARE SYMPTOMS THAT SHOULD BE REPORTED IMMEDIATELY: *FEVER GREATER THAN 100.4 F (38 C) OR HIGHER *CHILLS OR SWEATING *NAUSEA AND VOMITING THAT IS NOT CONTROLLED WITH YOUR NAUSEA MEDICATION *UNUSUAL SHORTNESS OF BREATH *UNUSUAL BRUISING OR BLEEDING *URINARY PROBLEMS (pain or burning when urinating, or frequent urination) *BOWEL PROBLEMS (unusual diarrhea, constipation, pain near the anus) TENDERNESS IN MOUTH AND THROAT WITH OR WITHOUT PRESENCE OF ULCERS (sore throat, sores in mouth, or a toothache) UNUSUAL RASH, SWELLING OR PAIN  UNUSUAL VAGINAL DISCHARGE OR ITCHING   Items with * indicate a potential emergency and should be followed up as soon as possible or go to the Emergency Department if any problems should occur.  Please show the CHEMOTHERAPY ALERT CARD or IMMUNOTHERAPY  ALERT CARD at check-in to the Emergency Department and triage nurse.  Should you have questions after your visit or need to cancel or reschedule your appointment, please contact [place] [facility]  Dept: [phone]  and follow the prompts.  Office hours are 8:00 a.m. to 4:30 p.m. Monday - Friday. Please note that voicemails left after 4:00 p.m. may not be returned until the following business day.  We are closed weekends and major holidays. You have access to a nurse at all times for urgent questions. Please call the main number to the clinic Dept: [phone] and follow the prompts.   For any non-urgent questions, you may also contact your provider using MyChart. We now offer e-Visits for anyone 18 and older to request care online for non-urgent symptoms. For details visit [email or web address].   Also download the MyChart app! Go to the app store, search "MyChart", open the app, select [place], and log in with your MyChart username and password.   

## 2022-10-11 ENCOUNTER — Telehealth: Payer: Self-pay | Admitting: Internal Medicine

## 2022-10-11 NOTE — Telephone Encounter (Signed)
Called patient regarding upcoming June appointment, patient hs been called and notified.

## 2022-10-18 ENCOUNTER — Inpatient Hospital Stay: Payer: Self-pay | Admitting: *Deleted

## 2022-10-18 ENCOUNTER — Encounter: Payer: Self-pay | Admitting: Internal Medicine

## 2022-10-18 DIAGNOSIS — C719 Malignant neoplasm of brain, unspecified: Secondary | ICD-10-CM

## 2022-10-18 NOTE — Progress Notes (Signed)
[facility] Clinical Social Work  Clinical Social Work was referred by nurse for assessment of psychosocial needs.  Clinical Social Worker contacted patient by phone to offer support and assess for needs.  CSW talked to patient, patient's daughter, and patient's sister by phone to discuss medication access concerns. Ms. Dewaard shared she recently lost insurance coverage and is unable to pay for medications. Her primary concern is lacosamide.  Patient receives Social Security Disability/Long-term disability but no longer has insurance through employer.   Patient/family agreed main concern is acquiring medications.  Medical Oncology RN contacted [facility] and cost out of packet would be $23.82. Patient/family stated they can acquire this medication.  CSW will follow up with patient/family to discuss additional resource needs for medication/insurance.     Polo Riley, LCSW  Clinical Social Worker [facility]

## 2022-10-24 ENCOUNTER — Telehealth: Payer: Self-pay | Admitting: *Deleted

## 2022-10-24 NOTE — Telephone Encounter (Signed)
[employer] faxed request for medical records from 08/30/2022 to present.  Request successfully faxed to (SW) [facility] [phone]) to process request per protocol.  No further actions performed or required by this nurse.

## 2022-10-25 NOTE — Progress Notes (Unsigned)
Palliative Medicine [facility]  Telephone:(336) [phone] Fax:([phone]) [phone]   Name: [name] Date: 10/25/2022 MRN: [id number]  DOB: [birth year]  Patient Care Team: Shirline Frees, NP as PCP - General (Family Medicine) Trilby Leaver, MD as Referring Physician (Gastroenterology) Malachy Mood, MD as Consulting Physician (Oncology) Barbaraann Cao Georgeanna Lea, MD as Consulting Physician (Oncology)    INTERVAL HISTORY: [name] is a 57 y.o. female with medical history of glioblastoma s/p resection (11/2020) IMRT, currently on dexamethasone and Temodar, pancreatic lesion, hypertension, and hyperlipidemia.  Palliative ask to see for symptom management and goals of care.   SOCIAL HISTORY:    [name] reports that she has never smoked. She has never used smokeless tobacco. She reports that she does not drink alcohol and does not use drugs.  ADVANCE DIRECTIVES:  None on file  CODE STATUS: Full code  PAST MEDICAL HISTORY: Past Medical History:  Diagnosis Date   Bell's palsy    left side of face- notied in smile and eyes, if very tired   Brain cancer ([name])    Complication of anesthesia    N/V   Essential hypertension    External hemorrhoids    History of blood transfusion    post op Hemorroids   History of pneumonia    Hyperlipidemia    Hyperthyroidism    hx 57 years old- oral meds   Iron deficiency anemia    Migraine headache    Pneumonia    x 2 - years ago   PONV (postoperative nausea and vomiting)    PPD positive    Pulmonary nodules    not seen on plain cxr, first detected 1/07 re ct 10/08 pos IPPD > 15 mm 12/09 FOB/lavage 04/20/08 neg afb smear   PVC's (premature ventricular contractions)    Seizure ([name]) 11/15/2020   Sleep apnea    refused CPAP- mouth piece recommended   Tuberculosis    in her late 30's    ALLERGIES:  has No Known Allergies.  MEDICATIONS:  Current Outpatient Medications  Medication Sig Dispense Refill   amLODipine  (NORVASC) 10 MG tablet TAKE 1 TABLET BY MOUTH EVERY DAY 90 tablet 1   HYDROcodone bit-homatropine (HYCODAN) 5-1.5 MG/5ML syrup Take 5 mLs by mouth every 8 (eight) hours as needed for cough. 120 mL 0   Lacosamide 100 MG TABS TAKE 1 TABLET BY MOUTH TWICE A DAY 60 tablet 2   levETIRAcetam (KEPPRA) 1000 MG tablet Take 2 tablets (2,000 mg total) by mouth 2 (two) times daily. 120 tablet 2   levothyroxine (SYNTHROID) 100 MCG tablet Take 1 tablet (100 mcg total) by mouth daily. 90 tablet 3   LORazepam (ATIVAN) 2 MG tablet Take 1 tablet (2 mg total) by mouth every 8 (eight) hours as needed for seizure. 12 tablet 0   ondansetron (ZOFRAN-ODT) 4 MG disintegrating tablet Take 1 tablet (4 mg total) by mouth every 8 (eight) hours as needed for nausea or vomiting. 10 tablet 0   Potassium Chloride ER 20 MEQ TBCR TAKE 1 TABLET BY MOUTH EVERY DAY 90 tablet 3   prochlorperazine (COMPAZINE) 10 MG tablet Take 10 mg by mouth every 6 (six) hours as needed for nausea or vomiting.     No current facility-administered medications for this visit.    VITAL SIGNS: There were no vitals taken for this visit. There were no vitals filed for this visit.  Estimated body mass index is 25.94 kg/m as calculated from the following:  Height as of 10/10/22: 5\' 7"  (1.702 m).   Weight as of 10/10/22: 165 lb 9.6 oz (75.1 kg).   PERFORMANCE STATUS (ECOG) : 1 - Symptomatic but completely ambulatory   IMPRESSION: I saw [name] during her infusion. No acute distress. States she is doing fairly well. Is trying to remain as active as possible. Shares that she is considering going back to work for a few hours to see if she can tolerate some normalcy. Denies nausea, vomiting, constipation, or diarrhea.    Seizure Management: Well-managed on current regimen including Ativan as needed and Keppra.  Goals of Care: [name] expresses the desire to live each day to its fullest. She is concerned that the new lesions on top of her head  represent cancer spread. She expresses being frustrated with seizure episodes as well as a decreased ability to concentrate. Emotional support provided through active listening and quiet presence.   PLAN: No symptom management needs at this time Will continue to monitor closely and adjust regimen as needed. Follow-up in 4-6 weeks coordinated with upcoming oncology appointments.  Patient knows to contact the office if needed.  Patient expressed understanding and was in agreement with this plan. She also understands that She can call the clinic at any time with any questions, concerns, or complaints.   Visit consisted of counseling and education dealing with the complex and emotionally intense issues of symptom management and palliative care in the setting of serious and potentially life-threatening illness.Greater than 50%  of this time was spent counseling and coordinating care related to the above assessment and plan.  Willette Alma, AGPCNP-BC  Palliative Medicine Team/[facility]  *Please note that this is a verbal dictation therefore any spelling or grammatical errors are due to the "Dragon Medical One" system interpretation.

## 2022-10-28 ENCOUNTER — Telehealth: Payer: Self-pay | Admitting: *Deleted

## 2022-10-28 NOTE — Telephone Encounter (Signed)
Advised collaborative [facility] Forms staff received request for medical records twice in April and June of 2024.  Request faxed to (SW) H.I.M per protocol.  No Attending Physician Statement (APS) or return to work (RTW) request received.     Connected with Larita Fife with H.I.M. [phone]).  "Request addressed correctly to [facility] reads incorrect address." Message left for Arlys John with [employer] [phone]. [phone]) with above information requesting forms not yet received (APS or RTW) if needed with this nurse direct phone extension. Awaiting return call or new correct request.

## 2022-10-29 ENCOUNTER — Inpatient Hospital Stay: Payer: Self-pay | Admitting: Licensed Clinical Social Worker

## 2022-10-29 DIAGNOSIS — C719 Malignant neoplasm of brain, unspecified: Secondary | ICD-10-CM

## 2022-10-29 NOTE — Progress Notes (Signed)
[facility] CSW Progress Note  Visual merchandiser  spoke with pt over the phone to schedule a time to discuss financial and insurance resources in person with a family member.  Pt will be in infusion on 6/27.  Pt verbalized agreement to bring a family member with her to this appointment to discuss resources.  CSW to meet with pt in infusion on 6/27.      Rachel Moulds, LCSW Clinical Social Worker [facility]

## 2022-10-30 MED FILL — Dexamethasone Sodium Phosphate Inj 100 MG/10ML: INTRAMUSCULAR | Qty: 1 | Status: AC

## 2022-10-31 ENCOUNTER — Encounter: Payer: Self-pay | Admitting: Hematology and Oncology

## 2022-10-31 ENCOUNTER — Inpatient Hospital Stay: Payer: Self-pay

## 2022-10-31 ENCOUNTER — Ambulatory Visit: Payer: BC Managed Care – PPO | Admitting: Hematology and Oncology

## 2022-10-31 ENCOUNTER — Ambulatory Visit: Payer: BC Managed Care – PPO

## 2022-10-31 ENCOUNTER — Other Ambulatory Visit: Payer: Self-pay | Admitting: *Deleted

## 2022-10-31 ENCOUNTER — Encounter: Payer: Self-pay | Admitting: Nurse Practitioner

## 2022-10-31 ENCOUNTER — Inpatient Hospital Stay (HOSPITAL_BASED_OUTPATIENT_CLINIC_OR_DEPARTMENT_OTHER): Payer: Self-pay | Admitting: Nurse Practitioner

## 2022-10-31 ENCOUNTER — Other Ambulatory Visit: Payer: BC Managed Care – PPO

## 2022-10-31 ENCOUNTER — Inpatient Hospital Stay: Payer: Self-pay | Admitting: Licensed Clinical Social Worker

## 2022-10-31 ENCOUNTER — Inpatient Hospital Stay (HOSPITAL_BASED_OUTPATIENT_CLINIC_OR_DEPARTMENT_OTHER): Payer: Self-pay | Admitting: Internal Medicine

## 2022-10-31 VITALS — BP 140/90 | HR 93 | Temp 98.3°F | Resp 17 | Wt 164.5 lb

## 2022-10-31 VITALS — BP 139/87 | HR 82 | Resp 16

## 2022-10-31 DIAGNOSIS — Z515 Encounter for palliative care: Secondary | ICD-10-CM

## 2022-10-31 DIAGNOSIS — R569 Unspecified convulsions: Secondary | ICD-10-CM

## 2022-10-31 DIAGNOSIS — R530 Neoplastic (malignant) related fatigue: Secondary | ICD-10-CM

## 2022-10-31 DIAGNOSIS — C719 Malignant neoplasm of brain, unspecified: Secondary | ICD-10-CM

## 2022-10-31 LAB — CBC WITH DIFFERENTIAL (CANCER CENTER ONLY)
Abs Immature Granulocytes: 0 10*3/uL (ref 0.00–0.07)
Basophils Absolute: 0 10*3/uL (ref 0.0–0.1)
Basophils Relative: 0 %
Eosinophils Absolute: 0.1 10*3/uL (ref 0.0–0.5)
Eosinophils Relative: 4 %
HCT: 38.6 % (ref 36.0–46.0)
Hemoglobin: 12.2 g/dL (ref 12.0–15.0)
Immature Granulocytes: 0 %
Lymphocytes Relative: 32 %
Lymphs Abs: 0.8 10*3/uL (ref 0.7–4.0)
MCH: 29.9 pg (ref 26.0–34.0)
MCHC: 31.6 g/dL (ref 30.0–36.0)
MCV: 94.6 fL (ref 80.0–100.0)
Monocytes Absolute: 0.3 10*3/uL (ref 0.1–1.0)
Monocytes Relative: 12 %
Neutro Abs: 1.3 10*3/uL — ABNORMAL LOW (ref 1.7–7.7)
Neutrophils Relative %: 52 %
Platelet Count: 195 10*3/uL (ref 150–400)
RBC: 4.08 MIL/uL (ref 3.87–5.11)
RDW: 16.1 % — ABNORMAL HIGH (ref 11.5–15.5)
WBC Count: 2.5 10*3/uL — ABNORMAL LOW (ref 4.0–10.5)
nRBC: 0 % (ref 0.0–0.2)

## 2022-10-31 LAB — CMP (CANCER CENTER ONLY)
ALT: 17 U/L (ref 0–44)
AST: 17 U/L (ref 15–41)
Albumin: 4.1 g/dL (ref 3.5–5.0)
Alkaline Phosphatase: 106 U/L (ref 38–126)
Anion gap: 9 (ref 5–15)
BUN: 11 mg/dL (ref 6–20)
CO2: 24 mmol/L (ref 22–32)
Calcium: 9.8 mg/dL (ref 8.9–10.3)
Chloride: 107 mmol/L (ref 98–111)
Creatinine: 0.86 mg/dL (ref 0.44–1.00)
GFR, Estimated: >60 mL/min (ref >60)
Glucose, Bld: 120 mg/dL — ABNORMAL HIGH (ref 70–99)
Potassium: 3.3 mmol/L — ABNORMAL LOW (ref 3.5–5.1)
Sodium: 140 mmol/L (ref 135–145)
Total Bilirubin: 0.7 mg/dL (ref 0.3–1.2)
Total Protein: 7.3 g/dL (ref 6.5–8.1)

## 2022-10-31 LAB — TOTAL PROTEIN, URINE DIPSTICK: Protein, ur: NEGATIVE mg/dL

## 2022-10-31 MED ORDER — PALONOSETRON HCL INJECTION 0.25 MG/5ML
0.2500 mg | Freq: Once | INTRAVENOUS | Status: AC
  Administered 2022-10-31: 0.25 mg via INTRAVENOUS
  Filled 2022-10-31: qty 5

## 2022-10-31 MED ORDER — IRINOTECAN HCL CHEMO INJECTION 100 MG/5ML
90.0000 mg/m2 | Freq: Once | INTRAVENOUS | Status: AC
  Administered 2022-10-31: 160 mg via INTRAVENOUS
  Filled 2022-10-31: qty 2.27

## 2022-10-31 MED ORDER — SODIUM CHLORIDE 0.9 % IV SOLN: Freq: Once | INTRAVENOUS | Status: AC

## 2022-10-31 MED ORDER — SODIUM CHLORIDE 0.9 % IV SOLN
10.0000 mg | Freq: Once | INTRAVENOUS | Status: AC
  Administered 2022-10-31: 10 mg via INTRAVENOUS
  Filled 2022-10-31: qty 10

## 2022-10-31 MED ORDER — LORAZEPAM 2 MG/ML IJ SOLN
1.0000 mg | Freq: Once | INTRAMUSCULAR | Status: AC
  Administered 2022-10-31: 1 mg via INTRAVENOUS
  Filled 2022-10-31: qty 1

## 2022-10-31 MED ORDER — SODIUM CHLORIDE 0.9 % IV SOLN
10.0000 mg/kg | Freq: Once | INTRAVENOUS | Status: AC
  Administered 2022-10-31: 700 mg via INTRAVENOUS
  Filled 2022-10-31: qty 12

## 2022-10-31 MED ORDER — DEXTROSE 5 % IV SOLN
125.0000 mg/m2 | Freq: Once | INTRAVENOUS | Status: DC
Start: 2022-10-31 — End: 2022-10-31

## 2022-10-31 MED ORDER — ATROPINE SULFATE 1 MG/ML IV SOLN
0.5000 mg | Freq: Once | INTRAVENOUS | Status: AC | PRN
  Administered 2022-10-31: 0.5 mg via INTRAVENOUS
  Filled 2022-10-31: qty 1

## 2022-10-31 MED ORDER — ONDANSETRON 4 MG PO TBDP
4.0000 mg | ORAL_TABLET | Freq: Three times a day (TID) | ORAL | 0 refills | Status: AC | PRN
Start: 2022-10-31 — End: ?

## 2022-10-31 NOTE — Progress Notes (Signed)
Per Dr Barbaraann Cao, ok to tx with low ANC.

## 2022-10-31 NOTE — Progress Notes (Signed)
Staff message sent to Darl Pikes in social work regarding providing my card with my contact information for present family member with patient when sw meets. Advised to have them contact me at earliest convenience on tomorrow 11/01/22 to discuss [place].

## 2022-10-31 NOTE — Progress Notes (Signed)
[facility] at [facility] [address]  [place], [place] [address] [phone]   Interval Evaluation  Date of Service: 10/31/22 Patient Name: [name] Patient MRN: [id number] Patient DOB: 11/18/1965 Provider: Henreitta Leber, MD  Identifying Statement:  [name] is a 57 y.o. 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)   11/21/2020 Cancer Staging   Staging form: Brain and Spinal Cord, AJCC 8th Edition - Pathologic stage from 11/21/2020: WHO Grade IV - Signed by Henreitta Leber, MD on 11/30/2020 Histopathologic type: Glioblastoma Stage prefix: Initial diagnosis Histologic grading system: 4 grade system Extent of surgical resection: Subtotal resection Karnofsky performance status: Score 90 Seizures at presentation: Present Duration of symptoms before diagnosis: Short IDH1 mutation: Negative   12/27/2020 - 02/07/2021 Radiation Therapy   IMRT and concurrent Temodar Basilio [place])   03/12/2021 - 08/26/2021 Chemotherapy   Completes 5 cycles of adjuvant 5-day Temodar   08/27/2021 Progression   POD #1   09/06/2021 -  Chemotherapy   Initiates second line therapy; CCNU 90mg /m2 PO q6 weeks and avastin 10mg /kg IV q2 weeks   12/28/2021 Progression   POD #2   01/17/2022 -  Chemotherapy   Third line therapy with Irinotecan and Avastin q2 weeks     Biomarkers:  MGMT Methylated.  IDH 1/2 Wild type.  EGFR Unknown  TERT Unknown   Interval History: [name] presents today for avastin and irinotecan infusion.  Feels well overall.  No new or progressive neurologic complaints today. Short term memory impairment and fatigue are still persistent.  Fatigue continues to be a problem.  She is interested in returning to work (from home) part time.    H+P  (11/25/20) Patient presented to medical attention this past month with first ever seizure.  She describes episode of sudden onset "disconnection" with tongue biting and urination, followed by period of confusion.  CNS imaging demonstrated enhancing mass within left anterior temporal lobe, which was mostly resected by Dr. Maurice Small on 11/21/20.  Since surgery she had not had recurrence of seizures.  She complains of painful cramping of her right leg, which started ~1 week prior.  At this time the cramping is her main complaint.  Otherwise fully functional, independent, would like to return to work if possible.  Medications: Current Outpatient Medications on File Prior to Visit  Medication Sig Dispense Refill   amLODipine (NORVASC) 10 MG tablet TAKE 1 TABLET BY MOUTH EVERY DAY 90 tablet 1   HYDROcodone bit-homatropine (HYCODAN) 5-1.5 MG/5ML syrup Take 5 mLs by mouth every 8 (eight) hours as needed for cough. 120 mL 0   Lacosamide 100 MG TABS TAKE 1 TABLET BY MOUTH TWICE A DAY 60 tablet 2   levETIRAcetam (KEPPRA) 1000 MG tablet Take 2 tablets (2,000 mg total) by mouth 2 (two) times daily. 120 tablet 2   levothyroxine (SYNTHROID) 100 MCG tablet Take 1 tablet (100 mcg total) by mouth daily. 90 tablet 3   LORazepam (ATIVAN) 2 MG tablet Take 1 tablet (2 mg total) by mouth every 8 (eight) hours as needed for seizure. 12 tablet 0   ondansetron (ZOFRAN-ODT) 4 MG disintegrating tablet Take 1 tablet (4 mg total) by mouth every 8 (eight) hours as needed for nausea or vomiting. 10 tablet 0   Potassium Chloride ER 20 MEQ TBCR TAKE 1 TABLET BY MOUTH  EVERY DAY 90 tablet 3   prochlorperazine (COMPAZINE) 10 MG tablet Take 10 mg by mouth every 6 (six) hours as needed for nausea or vomiting.     No current facility-administered medications on file prior to visit.    Allergies: No Known Allergies Past Medical History:  Past Medical History:  Diagnosis Date   Bell's palsy    left side of face- notied in smile and  eyes, if very tired   Brain cancer ([name])    Complication of anesthesia    N/V   Essential hypertension    External hemorrhoids    History of blood transfusion    post op Hemorroids   History of pneumonia    Hyperlipidemia    Hyperthyroidism    hx 57 years old- oral meds   Iron deficiency anemia    Migraine headache    Pneumonia    x 2 - years ago   PONV (postoperative nausea and vomiting)    PPD positive    Pulmonary nodules    not seen on plain cxr, first detected 1/07 re ct 10/08 pos IPPD > 15 mm 12/09 FOB/lavage 04/20/08 neg afb smear   PVC's (premature ventricular contractions)    Seizure ([name]) 11/15/2020   Sleep apnea    refused CPAP- mouth piece recommended   Tuberculosis    in her late 30's   Past Surgical History:  Past Surgical History:  Procedure Laterality Date   ABDOMINAL HYSTERECTOMY     APPLICATION OF CRANIAL NAVIGATION N/A 11/21/2020   Procedure: APPLICATION OF CRANIAL NAVIGATION;  Surgeon: Jadene Pierini, MD;  Location: [facility] OR;  Service: Neurosurgery;  Laterality: N/A;   BREAST BIOPSY Right 2014   BREAST BIOPSY Left 10/29/2019   CHOLECYSTECTOMY  2014   COLONOSCOPY  05/04/2009   prolapsing external hemorrhoids   CRANIOTOMY Left 11/21/2020   Procedure: Left craniotomy for tumor resection;  Surgeon: Jadene Pierini, MD;  Location: [facility] OR;  Service: Neurosurgery;  Laterality: Left;   ENDOMETRIAL ABLATION     HEMORRHOID SURGERY     x 3   TUBAL LIGATION     Social History:  Social History   Socioeconomic History   Marital status: Divorced    Spouse name: Not on file   Number of children: 2   Years of education: Not on file   Highest education level: Associate degree: occupational, Scientist, product/process development, or vocational program  Occupational History   Occupation: accounts IT trainer: [employer]  Tobacco Use   Smoking status: Never   Smokeless tobacco: Never  Vaping Use   Vaping Use: Never used  Substance and Sexual Activity   Alcohol use: No     Alcohol/week: 0.0 standard drinks of alcohol   Drug use: No   Sexual activity: Not on file  Other Topics Concern   Not on file  Social History Narrative   Divorced with 2 children   She works as a Merchandiser, retail in Audiological scientist    Possible TB exposure 2008 (after nodules found)   From [place] lived there and [place]   Never owned birds      Social Determinants of Health   Financial Resource Strain: Medium Risk (06/13/2021)   Overall Financial Resource Strain (CARDIA)    Difficulty of Paying Living Expenses: Somewhat hard  Food Insecurity: Food Insecurity Present (04/23/2021)   Hunger Vital Sign    Worried About Running Out of Food in the Last Year: Sometimes true    Ran Out of Food in  the Last Year: Never true  Transportation Needs: No Transportation Needs (06/13/2021)   PRAPARE - Administrator, Civil Service (Medical): No    Lack of Transportation (Non-Medical): No  Physical Activity: Unknown (04/23/2021)   Exercise Vital Sign    Days of Exercise per Week: 0 days    Minutes of Exercise per Session: Not on file  Stress: No Stress Concern Present (04/23/2021)   [employer] of Occupational Health - Occupational Stress Questionnaire    Feeling of Stress : Only a little  Social Connections: Moderately Integrated (04/23/2021)   Social Connection and Isolation Panel [NHANES]    Frequency of Communication with Friends and Family: Twice a week    Frequency of Social Gatherings with Friends and Family: Twice a week    Attends Religious Services: More than 4 times per year    Active Member of [employer] or Organizations: Yes    Attends Engineer, structural: More than 4 times per year    Marital Status: Divorced  Catering manager Violence: Not on file   Family History:  Family History  Problem Relation Age of Onset   Diabetes type II Mother    Kidney failure Sister    Diabetes Mellitus II Sister    Colon cancer Maternal Aunt        aunts and uncles   Diabetes Mellitus II  Maternal Aunt    Pancreatic cancer Maternal Aunt    Colon cancer Maternal Uncle    Diabetes Mellitus II Maternal Uncle    Heart disease Maternal Grandmother        aunts, uncles, sister   Diabetes Mellitus II Maternal Grandmother    Lung cancer Other        aunts and uncles    Review of Systems: Constitutional: poor appetite Eyes: blurriness of vision Ears, nose, mouth, throat, and face: Doesn't report sore throat Respiratory: Doesn't report cough, dyspnea or wheezes Cardiovascular: Doesn't report palpitation, chest discomfort  Gastrointestinal:  Doesn't report nausea, constipation, diarrhea GU: Doesn't report incontinence Skin: Doesn't report skin rashes Neurological: Per HPI Musculoskeletal: bilateral shoulder pain Behavioral/Psych: +anxiety  Physical Exam: Vitals:   10/31/22 1301  BP: (!) 140/90  Pulse: 93  Resp: 17  Temp: 98.3 F (36.8 C)  SpO2: 99%    KPS: 70. General: Alert, cooperative, pleasant, in no acute distress Head: Normal EENT: No conjunctival injection or scleral icterus.  Lungs: Resp effort normal Cardiac: Regular rate Abdomen: Non-distended abdomen Skin: No rashes cyanosis or petechiae. Extremities: No clubbing or edema  Neurologic Exam: Mental Status: Awake, alert, attentive to examiner. Oriented to self and environment. Language is notable for modest impairment in fluency.  Impaired short term recall. Cranial Nerves: Visual acuity is grossly normal. Visual fields are full. Extra-ocular movements intact. No ptosis. Face L LMN paresis, chronic. Motor: Tone and bulk are normal. Power is full in both arms and legs. Reflexes are symmetric, no pathologic reflexes present.  Sensory: Intact to light touch Gait: Normal.   Labs: I have reviewed the data as listed    Component Value Date/Time   NA 140 10/10/2022 1121   K 3.2 (L) 10/10/2022 1121   CL 105 10/10/2022 1121   CO2 26 10/10/2022 1121   GLUCOSE 119 (H) 10/10/2022 1121   BUN 8 10/10/2022  1121   CREATININE 0.90 10/10/2022 1121   CREATININE 0.92 01/25/2020 1424   CALCIUM 10.3 10/10/2022 1121   PROT 7.4 10/10/2022 1121   ALBUMIN 4.5 10/10/2022 1121   AST  20 10/10/2022 1121   ALT 23 10/10/2022 1121   ALKPHOS 113 10/10/2022 1121   BILITOT 1.1 10/10/2022 1121   GFRNONAA >60 10/10/2022 1121   GFRNONAA 72 01/25/2020 1424   GFRAA 83 01/25/2020 1424   Lab Results  Component Value Date   WBC 2.5 (L) 10/31/2022   NEUTROABS 1.3 (L) 10/31/2022   HGB 12.2 10/31/2022   HCT 38.6 10/31/2022   MCV 94.6 10/31/2022   PLT 195 10/31/2022     Assessment/Plan Focal seizures ([name])  Glioblastoma with isocitrate dehydrogenase gene wildtype [facility]) - Plan: Clinic Appointment Request  [name] is clinically stable today. Labs demonstrate neutrophils 1.3 today.    Will recommend to administer cycle 8, day 15 irinotecan 90mg /m2 and avastin 10mg /m2 today.    Chemotherapy should be held for the following:  ANC less than 1,000  Platelets less than 100,000  LFT or creatinine greater than 2x ULN  If clinical concerns/contraindications develop  Avastin should be held for the following:  ANC less than 500  Platelets less than 50,000  LFT or creatinine greater than 2x ULN  If clinical concerns/contraindications develop  Agreeable with return to work part time.  For seizures, will con't Vimpat 100/100.  Keppra will also continue 1000mg  BID.  Will con't Potassium daily as well.  We ask that [name] return to clinic in 3 weeks following MRI brain, prior to next scheduled infusion of CPT-11, Avastin and labs.    All questions were answered. The patient knows to call the clinic with any problems, questions or concerns. No barriers to learning were detected.  The total time spent in the encounter was 30 minutes and more than 50% was on counseling and review of test results   Henreitta Leber, MD Medical Director of Neuro-Oncology [facility] at [place] 10/31/22 12:37 PM

## 2022-10-31 NOTE — Patient Instructions (Signed)
[place] [facility] AT [facility]   Discharge Instructions: Thank you for choosing [facility] to provide your oncology and hematology care.   If you have a lab appointment with the Cancer Center, please go directly to the Cancer Center and check in at the registration area.   Wear comfortable clothing and clothing appropriate for easy access to any Portacath or PICC line.   We strive to give you quality time with your provider. You may need to reschedule your appointment if you arrive late (15 or more minutes).  Arriving late affects you and other patients whose appointments are after yours.  Also, if you miss three or more appointments without notifying the office, you may be dismissed from the clinic at the provider's discretion.      For prescription refill requests, have your pharmacy contact our office and allow 72 hours for refills to be completed.    Today you received the following chemotherapy and/or immunotherapy agents: Bevacizumab (Avastin) and Irinotecan      To help prevent nausea and vomiting after your treatment, we encourage you to take your nausea medication as directed.  BELOW ARE SYMPTOMS THAT SHOULD BE REPORTED IMMEDIATELY: *FEVER GREATER THAN 100.4 F (38 C) OR HIGHER *CHILLS OR SWEATING *NAUSEA AND VOMITING THAT IS NOT CONTROLLED WITH YOUR NAUSEA MEDICATION *UNUSUAL SHORTNESS OF BREATH *UNUSUAL BRUISING OR BLEEDING *URINARY PROBLEMS (pain or burning when urinating, or frequent urination) *BOWEL PROBLEMS (unusual diarrhea, constipation, pain near the anus) TENDERNESS IN MOUTH AND THROAT WITH OR WITHOUT PRESENCE OF ULCERS (sore throat, sores in mouth, or a toothache) UNUSUAL RASH, SWELLING OR PAIN  UNUSUAL VAGINAL DISCHARGE OR ITCHING   Items with * indicate a potential emergency and should be followed up as soon as possible or go to the Emergency Department if any problems should occur.  Please show the CHEMOTHERAPY ALERT CARD or  IMMUNOTHERAPY ALERT CARD at check-in to the Emergency Department and triage nurse.  Should you have questions after your visit or need to cancel or reschedule your appointment, please contact [place] [facility] AT [facility]  Dept: [phone]  and follow the prompts.  Office hours are 8:00 a.m. to 4:30 p.m. Monday - Friday. Please note that voicemails left after 4:00 p.m. may not be returned until the following business day.  We are closed weekends and major holidays. You have access to a nurse at all times for urgent questions. Please call the main number to the clinic Dept: [phone] and follow the prompts.   For any non-urgent questions, you may also contact your provider using MyChart. We now offer e-Visits for anyone 56 and older to request care online for non-urgent symptoms. For details visit [email or web address].   Also download the MyChart app! Go to the app store, search "MyChart", open the app, select [place], and log in with your MyChart username and password.

## 2022-10-31 NOTE — Progress Notes (Signed)
[facility] CSW Progress Note  Visual merchandiser met with patient in infusion to discuss financial resources and insurance options.  Pt's sister Inetta Fermo was on speaker phone to hear all of the information as well.  Pt given link information for 2 organizations that may be able to assist w/ making COBRA payments if pt is able to reinstate her insurance from work.  Pt also given the link for Cancer[facility] to register for financial assistance.  Pt provided w/ contact information for Orbie Hurst and the [employer] to apply as well as a Sport and exercise psychologist through the [employer].  Pt advised to apply for Medicaid to see if she is eligible, if not she will need the official denial to be eligible for [facility] financial assistance with her outstanding hospital bills.  If pt is unable to get COBRA or Medicaid pt given information for  consortium to assist with signing up for market place insurance.  Pt given contact information for CSW to contact w/ any questions.  CSW will check in with pt next week.      Rachel Moulds, LCSW Clinical Social Worker [facility]

## 2022-11-01 ENCOUNTER — Telehealth: Payer: Self-pay | Admitting: Internal Medicine

## 2022-11-01 NOTE — Telephone Encounter (Signed)
Scheduled per 06/27 los, patient has been called and notified. 

## 2022-11-06 ENCOUNTER — Telehealth: Payer: Self-pay | Admitting: *Deleted

## 2022-11-06 NOTE — Telephone Encounter (Signed)
Copy of [employer] Return to Duty Form which was signed by Dr Barbaraann Cao for patient to return to work four hours a day from home was faxed to the following per patient request: [email or web address]

## 2022-11-14 ENCOUNTER — Encounter: Payer: Self-pay | Admitting: Internal Medicine

## 2022-11-15 ENCOUNTER — Ambulatory Visit (HOSPITAL_COMMUNITY)
Admission: RE | Admit: 2022-11-15 | Discharge: 2022-11-15 | Disposition: A | Discharge status: Home / Self Care | Payer: Self-pay | Source: Ambulatory Visit | Attending: Internal Medicine | Admitting: Internal Medicine

## 2022-11-15 DIAGNOSIS — C719 Malignant neoplasm of brain, unspecified: Secondary | ICD-10-CM | POA: Insufficient documentation

## 2022-11-15 MED ORDER — GADOBUTROL 1 MMOL/ML IV SOLN
7.0000 mL | Freq: Once | INTRAVENOUS | Status: AC | PRN
  Administered 2022-11-15: 7 mL via INTRAVENOUS

## 2022-11-20 ENCOUNTER — Encounter: Payer: Self-pay | Admitting: Internal Medicine

## 2022-11-20 MED FILL — Dexamethasone Sodium Phosphate Inj 100 MG/10ML: INTRAMUSCULAR | Qty: 1 | Status: AC

## 2022-11-21 ENCOUNTER — Inpatient Hospital Stay: Payer: Self-pay | Attending: Internal Medicine

## 2022-11-21 ENCOUNTER — Other Ambulatory Visit: Payer: Self-pay

## 2022-11-21 ENCOUNTER — Inpatient Hospital Stay: Payer: Self-pay

## 2022-11-21 ENCOUNTER — Inpatient Hospital Stay (HOSPITAL_BASED_OUTPATIENT_CLINIC_OR_DEPARTMENT_OTHER): Payer: Self-pay | Admitting: Internal Medicine

## 2022-11-21 VITALS — BP 135/88 | HR 84 | Resp 16

## 2022-11-21 DIAGNOSIS — C719 Malignant neoplasm of brain, unspecified: Secondary | ICD-10-CM

## 2022-11-21 DIAGNOSIS — C712 Malignant neoplasm of temporal lobe: Secondary | ICD-10-CM | POA: Insufficient documentation

## 2022-11-21 DIAGNOSIS — Z79899 Other long term (current) drug therapy: Secondary | ICD-10-CM | POA: Insufficient documentation

## 2022-11-21 DIAGNOSIS — Z5111 Encounter for antineoplastic chemotherapy: Secondary | ICD-10-CM | POA: Insufficient documentation

## 2022-11-21 LAB — CMP (CANCER CENTER ONLY)
ALT: 29 U/L (ref 0–44)
AST: 27 U/L (ref 15–41)
Albumin: 4.6 g/dL (ref 3.5–5.0)
Alkaline Phosphatase: 113 U/L (ref 38–126)
Anion gap: 8 (ref 5–15)
BUN: 11 mg/dL (ref 6–20)
CO2: 27 mmol/L (ref 22–32)
Calcium: 10.1 mg/dL (ref 8.9–10.3)
Chloride: 105 mmol/L (ref 98–111)
Creatinine: 1.02 mg/dL — ABNORMAL HIGH (ref 0.44–1.00)
GFR, Estimated: >60 mL/min (ref >60)
Glucose, Bld: 92 mg/dL (ref 70–99)
Potassium: 3.9 mmol/L (ref 3.5–5.1)
Sodium: 140 mmol/L (ref 135–145)
Total Bilirubin: 0.9 mg/dL (ref 0.3–1.2)
Total Protein: 7.7 g/dL (ref 6.5–8.1)

## 2022-11-21 LAB — CBC WITH DIFFERENTIAL (CANCER CENTER ONLY)
Abs Immature Granulocytes: 0.01 10*3/uL (ref 0.00–0.07)
Basophils Absolute: 0 10*3/uL (ref 0.0–0.1)
Basophils Relative: 0 %
Eosinophils Absolute: 0.1 10*3/uL (ref 0.0–0.5)
Eosinophils Relative: 3 %
HCT: 42.3 % (ref 36.0–46.0)
Hemoglobin: 13.6 g/dL (ref 12.0–15.0)
Immature Granulocytes: 0 %
Lymphocytes Relative: 33 %
Lymphs Abs: 0.9 10*3/uL (ref 0.7–4.0)
MCH: 30 pg (ref 26.0–34.0)
MCHC: 32.2 g/dL (ref 30.0–36.0)
MCV: 93.4 fL (ref 80.0–100.0)
Monocytes Absolute: 0.3 10*3/uL (ref 0.1–1.0)
Monocytes Relative: 10 %
Neutro Abs: 1.4 10*3/uL — ABNORMAL LOW (ref 1.7–7.7)
Neutrophils Relative %: 54 %
Platelet Count: 185 10*3/uL (ref 150–400)
RBC: 4.53 MIL/uL (ref 3.87–5.11)
RDW: 16.5 % — ABNORMAL HIGH (ref 11.5–15.5)
WBC Count: 2.7 10*3/uL — ABNORMAL LOW (ref 4.0–10.5)
nRBC: 0 % (ref 0.0–0.2)

## 2022-11-21 LAB — TOTAL PROTEIN, URINE DIPSTICK: Protein, ur: 30 mg/dL — AB

## 2022-11-21 MED ORDER — SODIUM CHLORIDE 0.9 % IV SOLN: Freq: Once | INTRAVENOUS | Status: AC

## 2022-11-21 MED ORDER — LORAZEPAM 2 MG PO TABS: 2.0000 mg | ORAL_TABLET | Freq: Three times a day (TID) | ORAL | 0 refills | Status: DC | PRN

## 2022-11-21 MED ORDER — SODIUM CHLORIDE 0.9 % IV SOLN
10.0000 mg/kg | Freq: Once | INTRAVENOUS | Status: AC
  Administered 2022-11-21: 700 mg via INTRAVENOUS
  Filled 2022-11-21: qty 12

## 2022-11-21 MED ORDER — LORAZEPAM 2 MG/ML IJ SOLN
1.0000 mg | Freq: Once | INTRAMUSCULAR | Status: AC
  Administered 2022-11-21: 1 mg via INTRAVENOUS
  Filled 2022-11-21: qty 1

## 2022-11-21 MED ORDER — PALONOSETRON HCL INJECTION 0.25 MG/5ML: 0.2500 mg | Freq: Once | INTRAVENOUS | Status: DC

## 2022-11-21 NOTE — Progress Notes (Signed)
[facility] [address]  [place], [place] [address] [phone]   Interval Evaluation  Date of Service: 11/21/22 Patient Name: [name] Patient MRN: [id number] Patient DOB: [birth year] Provider: Henreitta Leber, MD  Identifying Statement:  [name] is a 57 y.o. 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)   11/21/2020 Cancer Staging   Staging form: Brain and Spinal Cord, AJCC 8th Edition - Pathologic stage from 11/21/2020: WHO Grade IV - Signed by Henreitta Leber, MD on 11/30/2020 Histopathologic type: Glioblastoma Stage prefix: Initial diagnosis Histologic grading system: 4 grade system Extent of surgical resection: Subtotal resection Karnofsky performance status: Score 90 Seizures at presentation: Present Duration of symptoms before diagnosis: Short IDH1 mutation: Negative   12/27/2020 - 02/07/2021 Radiation Therapy   IMRT and concurrent Temodar [name])   03/12/2021 - 08/26/2021 Chemotherapy   Completes 5 cycles of adjuvant 5-day Temodar   08/27/2021 Progression   POD #1   09/06/2021 -  Chemotherapy   Initiates second line therapy; CCNU 90mg /m2 PO q6 weeks and avastin 10mg /kg IV q2 weeks   12/28/2021 Progression   POD #2   01/17/2022 -  Chemotherapy   Third line therapy with Irinotecan and Avastin q2 weeks     Biomarkers:  MGMT Methylated.  IDH 1/2 Wild type.  EGFR Unknown  TERT Unknown   Interval History: [name] presents today for planned avastin and irinotecan infusion after recent MRI brain. She is having some difficulty getting words out today.  This tends to come and go.  No other new or progressive neurologic complaints today. Short term memory impairment and fatigue are still persistent.  She remains  interested in returning to work (from home) part time.    H+P (11/25/20) Patient presented to medical attention this past month with first ever seizure.  She describes episode of sudden onset "disconnection" with tongue biting and urination, followed by period of confusion.  CNS imaging demonstrated enhancing mass within left anterior temporal lobe, which was mostly resected by Dr. Maurice Small on 11/21/20.  Since surgery she had not had recurrence of seizures.  She complains of painful cramping of her right leg, which started ~1 week prior.  At this time the cramping is her main complaint.  Otherwise fully functional, independent, would like to return to work if possible.  Medications: Current Outpatient Medications on File Prior to Visit  Medication Sig Dispense Refill   amLODipine (NORVASC) 10 MG tablet TAKE 1 TABLET BY MOUTH EVERY DAY 90 tablet 1   HYDROcodone bit-homatropine (HYCODAN) 5-1.5 MG/5ML syrup Take 5 mLs by mouth every 8 (eight) hours as needed for cough. 120 mL 0   Lacosamide 100 MG TABS TAKE 1 TABLET BY MOUTH TWICE A DAY 60 tablet 2   levETIRAcetam (KEPPRA) 1000 MG tablet Take 2 tablets (2,000 mg total) by mouth 2 (two) times daily. 120 tablet 2   levothyroxine (SYNTHROID) 100 MCG tablet Take 1 tablet (100 mcg total) by mouth daily. 90 tablet 3   ondansetron (ZOFRAN-ODT) 4 MG disintegrating tablet Take 1 tablet (4 mg total) by mouth every 8 (eight) hours as needed for nausea or vomiting. 10 tablet 0   Potassium Chloride ER 20 MEQ TBCR TAKE 1 TABLET BY MOUTH EVERY DAY 90 tablet 3   prochlorperazine (COMPAZINE) 10 MG tablet Take 10 mg  by mouth every 6 (six) hours as needed for nausea or vomiting.     No current facility-administered medications on file prior to visit.    Allergies: No Known Allergies Past Medical History:  Past Medical History:  Diagnosis Date   Bell's palsy    left side of face- notied in smile and eyes, if very tired   Brain cancer ([name])    Complication of  anesthesia    N/V   Essential hypertension    External hemorrhoids    History of blood transfusion    post op Hemorroids   History of pneumonia    Hyperlipidemia    Hyperthyroidism    hx 57 years old- oral meds   Iron deficiency anemia    Migraine headache    Pneumonia    x 2 - years ago   PONV (postoperative nausea and vomiting)    PPD positive    Pulmonary nodules    not seen on plain cxr, first detected 1/07 re ct 10/08 pos IPPD > 15 mm 12/09 FOB/lavage 04/20/08 neg afb smear   PVC's (premature ventricular contractions)    Seizure ([name]) 11/15/2020   Sleep apnea    refused CPAP- mouth piece recommended   Tuberculosis    in her late 30's   Past Surgical History:  Past Surgical History:  Procedure Laterality Date   ABDOMINAL HYSTERECTOMY     APPLICATION OF CRANIAL NAVIGATION N/A 11/21/2020   Procedure: APPLICATION OF CRANIAL NAVIGATION;  Surgeon: Jadene Pierini, MD;  Location: [facility] OR;  Service: Neurosurgery;  Laterality: N/A;   BREAST BIOPSY Right 2014   BREAST BIOPSY Left 10/29/2019   CHOLECYSTECTOMY  2014   COLONOSCOPY  05/04/2009   prolapsing external hemorrhoids   CRANIOTOMY Left 11/21/2020   Procedure: Left craniotomy for tumor resection;  Surgeon: Jadene Pierini, MD;  Location: [facility] OR;  Service: Neurosurgery;  Laterality: Left;   ENDOMETRIAL ABLATION     HEMORRHOID SURGERY     x 3   TUBAL LIGATION     Social History:  Social History   Socioeconomic History   Marital status: Divorced    Spouse name: Not on file   Number of children: 2   Years of education: Not on file   Highest education level: Associate degree: occupational, Scientist, product/process development, or vocational program  Occupational History   Occupation: accounts IT trainer: [employer]  Tobacco Use   Smoking status: Never   Smokeless tobacco: Never  Vaping Use   Vaping status: Never Used  Substance and Sexual Activity   Alcohol use: No    Alcohol/week: 0.0 standard drinks of alcohol   Drug use:  No   Sexual activity: Not on file  Other Topics Concern   Not on file  Social History Narrative   Divorced with 2 children   She works as a Merchandiser, retail in Audiological scientist    Possible TB exposure 2008 (after nodules found)   From [place] lived there and [place]   Never owned birds      Social Determinants of Health   Financial Resource Strain: Medium Risk (06/13/2021)   Overall Financial Resource Strain (CARDIA)    Difficulty of Paying Living Expenses: Somewhat hard  Food Insecurity: Food Insecurity Present (04/23/2021)   Hunger Vital Sign    Worried About Running Out of Food in the Last Year: Sometimes true    Ran Out of Food in the Last Year: Never true  Transportation Needs: No Transportation Needs (06/13/2021)   PRAPARE -  Administrator, Civil Service (Medical): No    Lack of Transportation (Non-Medical): No  Physical Activity: Unknown (04/23/2021)   Exercise Vital Sign    Days of Exercise per Week: 0 days    Minutes of Exercise per Session: Not on file  Stress: No Stress Concern Present (04/23/2021)   [employer] of Occupational Health - Occupational Stress Questionnaire    Feeling of Stress : Only a little  Social Connections: Unknown (09/16/2021)   Received from [facility]   Social Network    Social Network: Not on file  Intimate Partner Violence: Unknown (08/08/2021)   Received from [facility]   HITS    Physically Hurt: Not on file    Insult or Talk Down To: Not on file    Threaten Physical Harm: Not on file    Scream or Curse: Not on file   Family History:  Family History  Problem Relation Age of Onset   Diabetes type II Mother    Kidney failure Sister    Diabetes Mellitus II Sister    Colon cancer Maternal Aunt        aunts and uncles   Diabetes Mellitus II Maternal Aunt    Pancreatic cancer Maternal Aunt    Colon cancer Maternal Uncle    Diabetes Mellitus II Maternal Uncle    Heart disease Maternal Grandmother        aunts, uncles, sister   Diabetes  Mellitus II Maternal Grandmother    Lung cancer Other        aunts and uncles    Review of Systems: Constitutional: poor appetite Eyes: blurriness of vision Ears, nose, mouth, throat, and face: Doesn't report sore throat Respiratory: Doesn't report cough, dyspnea or wheezes Cardiovascular: Doesn't report palpitation, chest discomfort  Gastrointestinal:  Doesn't report nausea, constipation, diarrhea GU: Doesn't report incontinence Skin: Doesn't report skin rashes Neurological: Per HPI Musculoskeletal: bilateral shoulder pain Behavioral/Psych: +anxiety  Physical Exam: Vitals:   11/21/22 1016  BP: (!) 140/98  Pulse: 87  Resp: 17  Temp: 97.7 F (36.5 C)  SpO2: 100%    KPS: 70. General: Alert, cooperative, pleasant, in no acute distress Head: Normal EENT: No conjunctival injection or scleral icterus.  Lungs: Resp effort normal Cardiac: Regular rate Abdomen: Non-distended abdomen Skin: No rashes cyanosis or petechiae. Extremities: No clubbing or edema  Neurologic Exam: Mental Status: Awake, alert, attentive to examiner. Oriented to self and environment. Language is notable for modest impairment in fluency.  Impaired short term recall. Cranial Nerves: Visual acuity is grossly normal. Visual fields are full. Extra-ocular movements intact. No ptosis. Face L LMN paresis, chronic. Motor: Tone and bulk are normal. Power is full in both arms and legs. Reflexes are symmetric, no pathologic reflexes present.  Sensory: Intact to light touch Gait: Normal.   Labs: I have reviewed the data as listed    Component Value Date/Time   NA 140 11/21/2022 1001   K 3.9 11/21/2022 1001   CL 105 11/21/2022 1001   CO2 27 11/21/2022 1001   GLUCOSE 92 11/21/2022 1001   BUN 11 11/21/2022 1001   CREATININE 1.02 (H) 11/21/2022 1001   CREATININE 0.92 01/25/2020 1424   CALCIUM 10.1 11/21/2022 1001   PROT 7.7 11/21/2022 1001   ALBUMIN 4.6 11/21/2022 1001   AST 27 11/21/2022 1001   ALT 29  11/21/2022 1001   ALKPHOS 113 11/21/2022 1001   BILITOT 0.9 11/21/2022 1001   GFRNONAA >60 11/21/2022 1001   GFRNONAA 72 01/25/2020  1424   GFRAA 83 01/25/2020 1424   Lab Results  Component Value Date   WBC 2.7 (L) 11/21/2022   NEUTROABS 1.4 (L) 11/21/2022   HGB 13.6 11/21/2022   HCT 42.3 11/21/2022   MCV 93.4 11/21/2022   PLT 185 11/21/2022   Imaging:  [facility] Clinician Interpretation: I have personally reviewed the CNS images as listed.  My interpretation, in the context of the patient's clinical presentation, is stable disease pending official read  No results found.  Assessment/Plan Glioblastoma with isocitrate dehydrogenase gene wildtype [facility]) - Plan: Clinic Appointment Request  [name] is clinically stable today.  MRI brain demonstrates stable findings, pending official read.  Given stability on imagines, long term effects of chemotherapy, chronic cytopenias, patient tolerance and preference, will hold off on further Irinotecan.  We will continue to administer the avastin 10mg /kg, but transition to every 4 weeks.  She is agreeable with this.  Avastin should be held for the following:  ANC less than 500  Platelets less than 50,000  LFT or creatinine greater than 2x ULN  If clinical concerns/contraindications develop  Agreeable with return to work part time if circumstances allow.  For seizures, will con't Vimpat 100/100.  Keppra will also continue 1000mg  BID.  We ask that [name] return to clinic in 4 weeks for Avastin and labs.  Next MRI in 2 months.  All questions were answered. The patient knows to call the clinic with any problems, questions or concerns. No barriers to learning were detected.  The total time spent in the encounter was 40 minutes and more than 50% was on counseling and review of test results   Henreitta Leber, MD Medical Director of Neuro-Oncology [facility] at [facility] 11/21/22 11:53 AM

## 2022-11-21 NOTE — Progress Notes (Signed)
Per Barbaraann Cao, MD, okay to treat with neutrophils 1.4.   Pt to receive Avastin only today. No Irinotecan.

## 2022-11-21 NOTE — Patient Instructions (Signed)
[facility]  Discharge Instructions: Thank you for choosing [facility] to provide your oncology and hematology care.   If you have a lab appointment with the Cancer Center, please go directly to the Cancer Center and check in at the registration area.   Wear comfortable clothing and clothing appropriate for easy access to any Portacath or PICC line.   We strive to give you quality time with your provider. You may need to reschedule your appointment if you arrive late (15 or more minutes).  Arriving late affects you and other patients whose appointments are after yours.  Also, if you miss three or more appointments without notifying the office, you may be dismissed from the clinic at the provider's discretion.      For prescription refill requests, have your pharmacy contact our office and allow 72 hours for refills to be completed.    Today you received the following chemotherapy and/or immunotherapy agents Avastin       To help prevent nausea and vomiting after your treatment, we encourage you to take your nausea medication as directed.  BELOW ARE SYMPTOMS THAT SHOULD BE REPORTED IMMEDIATELY: *FEVER GREATER THAN 100.4 F (38 C) OR HIGHER *CHILLS OR SWEATING *NAUSEA AND VOMITING THAT IS NOT CONTROLLED WITH YOUR NAUSEA MEDICATION *UNUSUAL SHORTNESS OF BREATH *UNUSUAL BRUISING OR BLEEDING *URINARY PROBLEMS (pain or burning when urinating, or frequent urination) *BOWEL PROBLEMS (unusual diarrhea, constipation, pain near the anus) TENDERNESS IN MOUTH AND THROAT WITH OR WITHOUT PRESENCE OF ULCERS (sore throat, sores in mouth, or a toothache) UNUSUAL RASH, SWELLING OR PAIN  UNUSUAL VAGINAL DISCHARGE OR ITCHING   Items with * indicate a potential emergency and should be followed up as soon as possible or go to the Emergency Department if any problems should occur.  Please show the CHEMOTHERAPY ALERT CARD or IMMUNOTHERAPY ALERT CARD at  check-in to the Emergency Department and triage nurse.  Should you have questions after your visit or need to cancel or reschedule your appointment, please contact [place]  Dept: [phone]  and follow the prompts.  Office hours are 8:00 a.m. to 4:30 p.m. Monday - Friday. Please note that voicemails left after 4:00 p.m. may not be returned until the following business day.  We are closed weekends and major holidays. You have access to a nurse at all times for urgent questions. Please call the main number to the clinic Dept: [phone] and follow the prompts.   For any non-urgent questions, you may also contact your provider using MyChart. We now offer e-Visits for anyone 60 and older to request care online for non-urgent symptoms. For details visit [email or web address].   Also download the MyChart app! Go to the app store, search "MyChart", open the app, select [place], and log in with your MyChart username and password.  Bevacizumab Injection What is this medication? BEVACIZUMAB (be va SIZ yoo mab) treats some types of cancer. It works by blocking a protein that causes cancer cells to grow and multiply. This helps to slow or stop the spread of cancer cells. It is a monoclonal antibody. This medicine may be used for other purposes; ask your health care provider or pharmacist if you have questions. COMMON BRAND NAME(S): Alymsys, Avastin, MVASI, Omer Jack What should I tell my care team before I take this medication? They need to know if you have any of these conditions: Blood clots Coughing up blood Having or recent surgery  Heart failure High blood pressure History of a connection between 2 or more body parts that do not usually connect (fistula) History of a tear in your stomach or intestines Protein in your urine An unusual or allergic reaction to bevacizumab, other medications, foods, dyes, or preservatives Pregnant or trying to get  pregnant Breast-feeding How should I use this medication? This medication is injected into a vein. It is given by your care team in a hospital or clinic setting. Talk to your care team the use of this medication in children. Special care may be needed. Overdosage: If you think you have taken too much of this medicine contact a poison control center or emergency room at once. NOTE: This medicine is only for you. Do not share this medicine with others. What if I miss a dose? Keep appointments for follow-up doses. It is important not to miss your dose. Call your care team if you are unable to keep an appointment. What may interact with this medication? Interactions are not expected. This list may not describe all possible interactions. Give your health care provider a list of all the medicines, herbs, non-prescription drugs, or dietary supplements you use. Also tell them if you smoke, drink alcohol, or use illegal drugs. Some items may interact with your medicine. What should I watch for while using this medication? Your condition will be monitored carefully while you are receiving this medication. You may need blood work while taking this medication. This medication may make you feel generally unwell. This is not uncommon as chemotherapy can affect healthy cells as well as cancer cells. Report any side effects. Continue your course of treatment even though you feel ill unless your care team tells you to stop. This medication may increase your risk to bruise or bleed. Call your care team if you notice any unusual bleeding. Before having surgery, talk to your care team to make sure it is ok. This medication can increase the risk of poor healing of your surgical site or wound. You will need to stop this medication for 28 days before surgery. After surgery, wait at least 28 days before restarting this medication. Make sure the surgical site or wound is healed enough before restarting this medication. Talk  to your care team if questions. Talk to your care team if you may be pregnant. Serious birth defects can occur if you take this medication during pregnancy and for 6 months after the last dose. Contraception is recommended while taking this medication and for 6 months after the last dose. Your care team can help you find the option that works for you. Do not breastfeed while taking this medication and for 6 months after the last dose. This medication can cause infertility. Talk to your care team if you are concerned about your fertility. What side effects may I notice from receiving this medication? Side effects that you should report to your care team as soon as possible: Allergic reactions--skin rash, itching, hives, swelling of the face, lips, tongue, or throat Bleeding--bloody or black, tar-like stools, vomiting blood or brown material that looks like coffee grounds, red or dark brown urine, small red or purple spots on skin, unusual bruising or bleeding Blood clot--pain, swelling, or warmth in the leg, shortness of breath, chest pain Heart attack--pain or tightness in the chest, shoulders, arms, or jaw, nausea, shortness of breath, cold or clammy skin, feeling faint or lightheaded Heart failure--shortness of breath, swelling of the ankles, feet, or hands, sudden weight gain, unusual weakness  or fatigue Increase in blood pressure Infection--fever, chills, cough, sore throat, wounds that don't heal, pain or trouble when passing urine, general feeling of discomfort or being unwell Infusion reactions--chest pain, shortness of breath or trouble breathing, feeling faint or lightheaded Kidney injury--decrease in the amount of urine, swelling of the ankles, hands, or feet Stomach pain that is severe, does not go away, or gets worse Stroke--sudden numbness or weakness of the face, arm, or leg, trouble speaking, confusion, trouble walking, loss of balance or coordination, dizziness, severe headache,  change in vision Sudden and severe headache, confusion, change in vision, seizures, which may be signs of posterior reversible encephalopathy syndrome (PRES) Side effects that usually do not require medical attention (report to your care team if they continue or are bothersome): Back pain Change in taste Diarrhea Dry skin Increased tears Nosebleed This list may not describe all possible side effects. Call your doctor for medical advice about side effects. You may report side effects to FDA at 1-800-FDA-1088. Where should I keep my medication? This medication is given in a hospital or clinic. It will not be stored at home. NOTE: This sheet is a summary. It may not cover all possible information. If you have questions about this medicine, talk to your doctor, pharmacist, or health care provider.  2024 Elsevier/Gold Standard (2021-09-07 00:00:00)

## 2022-11-28 ENCOUNTER — Ambulatory Visit: Payer: Self-pay

## 2022-11-28 ENCOUNTER — Ambulatory Visit: Payer: Self-pay | Admitting: Internal Medicine

## 2022-11-28 ENCOUNTER — Other Ambulatory Visit: Payer: Self-pay

## 2022-12-06 NOTE — Progress Notes (Deleted)
Palliative Medicine [facility]  [phone]) [phone] [phone]   Name: [name] Date: 12/06/2022 MRN: [id number]  DOB: Dec 04, 1965  Patient Care Team: Patient, No Pcp Per as PCP - General (General Practice) Trilby Leaver, MD as Referring Physician (Gastroenterology) Malachy Mood, MD as Consulting Physician (Oncology) Barbaraann Cao Georgeanna Lea, MD as Consulting Physician (Oncology)   I connected with [name] on 12/06/22 at  1:00 PM EDT by phone and verified that I am speaking with the correct person using two identifiers.   I discussed the limitations, risks, security and privacy concerns of performing an evaluation and management service by telemedicine and the availability of in-person appointments. I also discussed with the patient that there may be a patient responsible charge related to this service. The patient expressed understanding and agreed to proceed.   Other persons participating in the visit and their role in the encounter: n/a   Patient's location: home  Provider's location: [facility]   Chief Complaint: f/u of symptom management    INTERVAL HISTORY: [name] is a 57 y.o. female with medical history of glioblastoma s/p resection (11/2020) IMRT, currently on dexamethasone and Temodar, pancreatic lesion, hypertension, and hyperlipidemia.  Palliative ask to see for symptom management and goals of care.   SOCIAL HISTORY:    [name] reports that she has never smoked. She has never used smokeless tobacco. She reports that she does not drink alcohol and does not use drugs.  ADVANCE DIRECTIVES:  None on file  CODE STATUS: Full code  PAST MEDICAL HISTORY: Past Medical History:  Diagnosis Date   Bell's palsy    left side of face- notied in smile and eyes, if very tired   Brain cancer ([name])    Complication of anesthesia    N/V   Essential hypertension    External hemorrhoids    History of blood transfusion    post op  Hemorroids   History of pneumonia    Hyperlipidemia    Hyperthyroidism    hx 57 years old- oral meds   Iron deficiency anemia    Migraine headache    Pneumonia    x 2 - years ago   PONV (postoperative nausea and vomiting)    PPD positive    Pulmonary nodules    not seen on plain cxr, first detected 1/07 re ct 10/08 pos IPPD > 15 mm 12/09 FOB/lavage 04/20/08 neg afb smear   PVC's (premature ventricular contractions)    Seizure ([name]) 11/15/2020   Sleep apnea    refused CPAP- mouth piece recommended   Tuberculosis    in her late 30's    ALLERGIES:  has No Known Allergies.  MEDICATIONS:  Current Outpatient Medications  Medication Sig Dispense Refill   amLODipine (NORVASC) 10 MG tablet TAKE 1 TABLET BY MOUTH EVERY DAY 90 tablet 1   HYDROcodone bit-homatropine (HYCODAN) 5-1.5 MG/5ML syrup Take 5 mLs by mouth every 8 (eight) hours as needed for cough. 120 mL 0   Lacosamide 100 MG TABS TAKE 1 TABLET BY MOUTH TWICE A DAY 60 tablet 2   levETIRAcetam (KEPPRA) 1000 MG tablet Take 2 tablets (2,000 mg total) by mouth 2 (two) times daily. 120 tablet 2   levothyroxine (SYNTHROID) 100 MCG tablet Take 1 tablet (100 mcg total) by mouth daily. 90 tablet 3   LORazepam (ATIVAN) 2 MG tablet Take 1 tablet (2 mg total) by mouth every 8 (eight) hours as needed for seizure. 12 tablet 0  ondansetron (ZOFRAN-ODT) 4 MG disintegrating tablet Take 1 tablet (4 mg total) by mouth every 8 (eight) hours as needed for nausea or vomiting. 10 tablet 0   Potassium Chloride ER 20 MEQ TBCR TAKE 1 TABLET BY MOUTH EVERY DAY 90 tablet 3   prochlorperazine (COMPAZINE) 10 MG tablet Take 10 mg by mouth every 6 (six) hours as needed for nausea or vomiting.     No current facility-administered medications for this visit.    VITAL SIGNS: There were no vitals taken for this visit. There were no vitals filed for this visit.  Estimated body mass index is 25.61 kg/m as calculated from the following:   Height as of 10/10/22: 5'  7" (1.702 m).   Weight as of 11/21/22: 163 lb 8 oz (74.2 kg).   PERFORMANCE STATUS (ECOG) : 1 - Symptomatic but completely ambulatory   IMPRESSION:    Seizure Management: Well-managed on current regimen including Ativan as needed and Keppra.  Goals of Care: [name] expresses the desire to live each day to its fullest. She is concerned that the new lesions on top of her head represent cancer spread. She expresses being frustrated with seizure episodes as well as a decreased ability to concentrate. Emotional support provided through active listening and quiet presence.   PLAN: No symptom management needs at this time Will continue to monitor closely and adjust regimen as needed. Follow-up in 4-6 weeks coordinated with upcoming oncology appointments.  Patient knows to contact the office if needed.  Patient expressed understanding and was in agreement with this plan. She also understands that She can call the clinic at any time with any questions, concerns, or complaints.   Visit consisted of counseling and education dealing with the complex and emotionally intense issues of symptom management and palliative care in the setting of serious and potentially life-threatening illness.Greater than 50%  of this time was spent counseling and coordinating care related to the above assessment and plan.  Willette Alma, AGPCNP-BC  Palliative Medicine Team/[facility]  *Please note that this is a verbal dictation therefore any spelling or grammatical errors are due to the "Dragon Medical One" system interpretation.

## 2022-12-09 ENCOUNTER — Inpatient Hospital Stay: Payer: BC Managed Care – PPO | Admitting: Nurse Practitioner

## 2022-12-12 ENCOUNTER — Ambulatory Visit: Payer: Self-pay | Admitting: Internal Medicine

## 2022-12-12 ENCOUNTER — Other Ambulatory Visit: Payer: Self-pay

## 2022-12-12 ENCOUNTER — Ambulatory Visit: Payer: Self-pay

## 2022-12-16 NOTE — Progress Notes (Unsigned)
Palliative Medicine [facility]  Telephone:(336) [phone] [phone]) [phone]   Name: [name] Date: 12/16/2022 MRN: [id number]  DOB: 07-14-1965  Patient Care Team: Patient, No Pcp Per as PCP - General (General Practice) Trilby Leaver, MD as Referring Physician (Gastroenterology) Malachy Mood, MD as Consulting Physician (Oncology) Barbaraann Cao Georgeanna Lea, MD as Consulting Physician (Oncology)   INTERVAL HISTORY: [name] is a 57 y.o. female with medical history of glioblastoma s/p resection (11/2020) IMRT, currently on dexamethasone and Temodar, pancreatic lesion, hypertension, and hyperlipidemia.  Palliative ask to see for symptom management and goals of care.   SOCIAL HISTORY:    [name] reports that she has never smoked. She has never used smokeless tobacco. She reports that she does not drink alcohol and does not use drugs.  ADVANCE DIRECTIVES:  None on file  CODE STATUS: Full code  PAST MEDICAL HISTORY: Past Medical History:  Diagnosis Date   Bell's palsy    left side of face- notied in smile and eyes, if very tired   Brain cancer ([name])    Complication of anesthesia    N/V   Essential hypertension    External hemorrhoids    History of blood transfusion    post op Hemorroids   History of pneumonia    Hyperlipidemia    Hyperthyroidism    hx 57 years old- oral meds   Iron deficiency anemia    Migraine headache    Pneumonia    x 2 - years ago   PONV (postoperative nausea and vomiting)    PPD positive    Pulmonary nodules    not seen on plain cxr, first detected 1/07 re ct 10/08 pos IPPD > 15 mm 12/09 FOB/lavage 04/20/08 neg afb smear   PVC's (premature ventricular contractions)    Seizure ([name]) 11/15/2020   Sleep apnea    refused CPAP- mouth piece recommended   Tuberculosis    in her late 30's    ALLERGIES:  has No Known Allergies.  MEDICATIONS:  Current Outpatient Medications  Medication Sig Dispense Refill   amLODipine  (NORVASC) 10 MG tablet TAKE 1 TABLET BY MOUTH EVERY DAY 90 tablet 1   HYDROcodone bit-homatropine (HYCODAN) 5-1.5 MG/5ML syrup Take 5 mLs by mouth every 8 (eight) hours as needed for cough. 120 mL 0   Lacosamide 100 MG TABS TAKE 1 TABLET BY MOUTH TWICE A DAY 60 tablet 2   levETIRAcetam (KEPPRA) 1000 MG tablet Take 2 tablets (2,000 mg total) by mouth 2 (two) times daily. 120 tablet 2   levothyroxine (SYNTHROID) 100 MCG tablet Take 1 tablet (100 mcg total) by mouth daily. 90 tablet 3   LORazepam (ATIVAN) 2 MG tablet Take 1 tablet (2 mg total) by mouth every 8 (eight) hours as needed for seizure. 12 tablet 0   ondansetron (ZOFRAN-ODT) 4 MG disintegrating tablet Take 1 tablet (4 mg total) by mouth every 8 (eight) hours as needed for nausea or vomiting. 10 tablet 0   Potassium Chloride ER 20 MEQ TBCR TAKE 1 TABLET BY MOUTH EVERY DAY 90 tablet 3   prochlorperazine (COMPAZINE) 10 MG tablet Take 10 mg by mouth every 6 (six) hours as needed for nausea or vomiting.     No current facility-administered medications for this visit.    VITAL SIGNS: There were no vitals taken for this visit. There were no vitals filed for this visit.  Estimated body mass index is 25.61 kg/m as calculated from the following:  Height as of 10/10/22: 5\' 7"  (1.702 m).   Weight as of 11/21/22: 163 lb 8 oz (74.2 kg).   PERFORMANCE STATUS (ECOG) : 1 - Symptomatic but completely ambulatory  Assessment NAD RRR Normal breathing pattern AAO x4, slow response   IMPRESSION: [name] presents to clinic today for follow-up. States she does not feel her best today. Some feelings of GI upset with nausea. Ongoing fatigue. Denies constipation or diarrhea. She has successfully returned to work on a daily 4 hours scheduled. She is appreciative of this. Does endorse by day 4 she is extremely fatigue and pushing thru to get day 5 done and over. Appetite is good.   Tj shares her appreciation of ongoing support. She continues to remain  hopeful. She is pushing herself with the support of the medical team. Speaks to her initial prognosis and ability to outlive life expectancy with understanding each day is valuable. Emotional support provided.   Her treatment is being delayed today to allow her some time to recover and feel somewhat better per Dr. Barbaraann Cao. States plans to go home and get some rest. Denies pain or discomfort.   Seizure Management: Well-managed on current regimen including Ativan as needed and Keppra.  Goals of Care: [name] expresses the desire to live each day to its fullest. She is concerned that the new lesions on top of her head represent cancer spread. She expresses being frustrated with seizure episodes as well as a decreased ability to concentrate. Emotional support provided through active listening and quiet presence.   PLAN: No symptom management needs at this time Will continue to monitor closely and adjust regimen as needed. Follow-up in 4-6 weeks coordinated with upcoming oncology appointments.  Patient knows to contact the office if needed.  Patient expressed understanding and was in agreement with this plan. She also understands that She can call the clinic at any time with any questions, concerns, or complaints.   Visit consisted of counseling and education dealing with the complex and emotionally intense issues of symptom management and palliative care in the setting of serious and potentially life-threatening illness.Greater than 50%  of this time was spent counseling and coordinating care related to the above assessment and plan.  Willette Alma, AGPCNP-BC  Palliative Medicine Team/[facility]  *Please note that this is a verbal dictation therefore any spelling or grammatical errors are due to the "Dragon Medical One" system interpretation.

## 2022-12-19 ENCOUNTER — Inpatient Hospital Stay (HOSPITAL_BASED_OUTPATIENT_CLINIC_OR_DEPARTMENT_OTHER): Payer: BC Managed Care – PPO | Admitting: Internal Medicine

## 2022-12-19 ENCOUNTER — Ambulatory Visit: Payer: Self-pay | Admitting: Internal Medicine

## 2022-12-19 ENCOUNTER — Inpatient Hospital Stay (HOSPITAL_BASED_OUTPATIENT_CLINIC_OR_DEPARTMENT_OTHER): Payer: BC Managed Care – PPO | Admitting: Nurse Practitioner

## 2022-12-19 ENCOUNTER — Other Ambulatory Visit: Payer: Self-pay

## 2022-12-19 ENCOUNTER — Inpatient Hospital Stay: Payer: BC Managed Care – PPO | Attending: Internal Medicine

## 2022-12-19 ENCOUNTER — Inpatient Hospital Stay: Payer: BC Managed Care – PPO

## 2022-12-19 ENCOUNTER — Ambulatory Visit: Payer: Self-pay

## 2022-12-19 ENCOUNTER — Encounter: Payer: Self-pay | Admitting: Nurse Practitioner

## 2022-12-19 VITALS — BP 148/92 | HR 80 | Temp 97.6°F | Resp 16 | Wt 166.6 lb

## 2022-12-19 DIAGNOSIS — C719 Malignant neoplasm of brain, unspecified: Secondary | ICD-10-CM

## 2022-12-19 DIAGNOSIS — Z800 Family history of malignant neoplasm of digestive organs: Secondary | ICD-10-CM | POA: Diagnosis not present

## 2022-12-19 DIAGNOSIS — R530 Neoplastic (malignant) related fatigue: Secondary | ICD-10-CM

## 2022-12-19 DIAGNOSIS — Z515 Encounter for palliative care: Secondary | ICD-10-CM

## 2022-12-19 DIAGNOSIS — Z7189 Other specified counseling: Secondary | ICD-10-CM

## 2022-12-19 DIAGNOSIS — Z90710 Acquired absence of both cervix and uterus: Secondary | ICD-10-CM | POA: Diagnosis not present

## 2022-12-19 DIAGNOSIS — C712 Malignant neoplasm of temporal lobe: Secondary | ICD-10-CM | POA: Diagnosis not present

## 2022-12-19 DIAGNOSIS — R569 Unspecified convulsions: Secondary | ICD-10-CM | POA: Insufficient documentation

## 2022-12-19 DIAGNOSIS — Z801 Family history of malignant neoplasm of trachea, bronchus and lung: Secondary | ICD-10-CM | POA: Diagnosis not present

## 2022-12-19 DIAGNOSIS — Z5111 Encounter for antineoplastic chemotherapy: Secondary | ICD-10-CM | POA: Diagnosis not present

## 2022-12-19 LAB — CMP (CANCER CENTER ONLY)
ALT: 23 U/L (ref 0–44)
AST: 20 U/L (ref 15–41)
Albumin: 4.5 g/dL (ref 3.5–5.0)
Alkaline Phosphatase: 127 U/L — ABNORMAL HIGH (ref 38–126)
Anion gap: 7 (ref 5–15)
BUN: 10 mg/dL (ref 6–20)
CO2: 28 mmol/L (ref 22–32)
Calcium: 9.9 mg/dL (ref 8.9–10.3)
Chloride: 104 mmol/L (ref 98–111)
Creatinine: 0.99 mg/dL (ref 0.44–1.00)
GFR, Estimated: >60 mL/min (ref >60)
Glucose, Bld: 96 mg/dL (ref 70–99)
Potassium: 4 mmol/L (ref 3.5–5.1)
Sodium: 139 mmol/L (ref 135–145)
Total Bilirubin: 0.8 mg/dL (ref 0.3–1.2)
Total Protein: 7.6 g/dL (ref 6.5–8.1)

## 2022-12-19 LAB — CBC WITH DIFFERENTIAL (CANCER CENTER ONLY)
Abs Immature Granulocytes: 0 10*3/uL (ref 0.00–0.07)
Basophils Absolute: 0 10*3/uL (ref 0.0–0.1)
Basophils Relative: 1 %
Eosinophils Absolute: 0.2 10*3/uL (ref 0.0–0.5)
Eosinophils Relative: 4 %
HCT: 43.1 % (ref 36.0–46.0)
Hemoglobin: 13.9 g/dL (ref 12.0–15.0)
Immature Granulocytes: 0 %
Lymphocytes Relative: 25 %
Lymphs Abs: 0.9 10*3/uL (ref 0.7–4.0)
MCH: 29.8 pg (ref 26.0–34.0)
MCHC: 32.3 g/dL (ref 30.0–36.0)
MCV: 92.5 fL (ref 80.0–100.0)
Monocytes Absolute: 0.3 10*3/uL (ref 0.1–1.0)
Monocytes Relative: 7 %
Neutro Abs: 2.4 10*3/uL (ref 1.7–7.7)
Neutrophils Relative %: 63 %
Platelet Count: 165 10*3/uL (ref 150–400)
RBC: 4.66 MIL/uL (ref 3.87–5.11)
RDW: 16.1 % — ABNORMAL HIGH (ref 11.5–15.5)
WBC Count: 3.8 10*3/uL — ABNORMAL LOW (ref 4.0–10.5)
nRBC: 0 % (ref 0.0–0.2)

## 2022-12-19 LAB — TOTAL PROTEIN, URINE DIPSTICK: Protein, ur: NEGATIVE mg/dL

## 2022-12-19 NOTE — Progress Notes (Signed)
[facility] at [facility] [address]  [place], [place] [address] [phone]   Interval Evaluation  Date of Service: 12/19/22 Patient Name: [name] Patient MRN: [id number] Patient DOB: 1965/08/28 Provider: Henreitta Leber, MD  Identifying Statement:  [name] is a 57 y.o. 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)   11/21/2020 Cancer Staging   Staging form: Brain and Spinal Cord, AJCC 8th Edition - Pathologic stage from 11/21/2020: WHO Grade IV - Signed by Henreitta Leber, MD on 11/30/2020 Histopathologic type: Glioblastoma Stage prefix: Initial diagnosis Histologic grading system: 4 grade system Extent of surgical resection: Subtotal resection Karnofsky performance status: Score 90 Seizures at presentation: Present Duration of symptoms before diagnosis: Short IDH1 mutation: Negative   12/27/2020 - 02/07/2021 Radiation Therapy   IMRT and concurrent Temodar [facility] [place])   03/12/2021 - 08/26/2021 Chemotherapy   Completes 5 cycles of adjuvant 5-day Temodar   08/27/2021 Progression   POD #1   09/06/2021 -  Chemotherapy   Initiates second line therapy; CCNU 90mg /m2 PO q6 weeks and avastin 10mg /kg IV q2 weeks   12/28/2021 Progression   POD #2   01/17/2022 -  Chemotherapy   Third line therapy with Irinotecan and Avastin q2 weeks     Biomarkers:  MGMT Methylated.  IDH 1/2 Wild type.  EGFR Unknown  TERT Unknown   Interval History: [name] presents today for planned avastin infusion, now without irinoteca. She continues to have difficulty getting words out as prior.  This tends to come and go.  No other new or progressive neurologic complaints today. Short term memory impairment and fatigue are still persistent.  She remains interested  in returning to work (from home) part time.    H+P (11/25/20) Patient presented to medical attention this past month with first ever seizure.  She describes episode of sudden onset "disconnection" with tongue biting and urination, followed by period of confusion.  CNS imaging demonstrated enhancing mass within left anterior temporal lobe, which was mostly resected by Dr. Maurice Small on 11/21/20.  Since surgery she had not had recurrence of seizures.  She complains of painful cramping of her right leg, which started ~1 week prior.  At this time the cramping is her main complaint.  Otherwise fully functional, independent, would like to return to work if possible.  Medications: Current Outpatient Medications on File Prior to Visit  Medication Sig Dispense Refill   levETIRAcetam (KEPPRA) 750 MG tablet Take 1,500 mg by mouth 2 (two) times daily.     amLODipine (NORVASC) 10 MG tablet TAKE 1 TABLET BY MOUTH EVERY DAY 90 tablet 1   HYDROcodone bit-homatropine (HYCODAN) 5-1.5 MG/5ML syrup Take 5 mLs by mouth every 8 (eight) hours as needed for cough. 120 mL 0   Lacosamide 100 MG TABS TAKE 1 TABLET BY MOUTH TWICE A DAY 60 tablet 2   levETIRAcetam (KEPPRA) 1000 MG tablet Take 2 tablets (2,000 mg total) by mouth 2 (two) times daily. 120 tablet 2   levothyroxine (SYNTHROID) 100 MCG tablet Take 1 tablet (100 mcg total) by mouth daily. 90 tablet 3   LORazepam (ATIVAN) 2 MG tablet Take 1 tablet (2 mg total) by mouth every 8 (eight) hours as needed for seizure. 12 tablet 0   ondansetron (ZOFRAN-ODT) 4 MG disintegrating tablet Take 1 tablet (4 mg total) by  mouth every 8 (eight) hours as needed for nausea or vomiting. 10 tablet 0   Potassium Chloride ER 20 MEQ TBCR TAKE 1 TABLET BY MOUTH EVERY DAY 90 tablet 3   prochlorperazine (COMPAZINE) 10 MG tablet Take 10 mg by mouth every 6 (six) hours as needed for nausea or vomiting.     No current facility-administered medications on file prior to visit.    Allergies: No  Known Allergies Past Medical History:  Past Medical History:  Diagnosis Date   Bell's palsy    left side of face- notied in smile and eyes, if very tired   Brain cancer ([name])    Complication of anesthesia    N/V   Essential hypertension    External hemorrhoids    History of blood transfusion    post op Hemorroids   History of pneumonia    Hyperlipidemia    Hyperthyroidism    hx 57 years old- oral meds   Iron deficiency anemia    Migraine headache    Pneumonia    x 2 - years ago   PONV (postoperative nausea and vomiting)    PPD positive    Pulmonary nodules    not seen on plain cxr, first detected 1/07 re ct 10/08 pos IPPD > 15 mm 12/09 FOB/lavage 04/20/08 neg afb smear   PVC's (premature ventricular contractions)    Seizure ([name]) 11/15/2020   Sleep apnea    refused CPAP- mouth piece recommended   Tuberculosis    in her late 30's   Past Surgical History:  Past Surgical History:  Procedure Laterality Date   ABDOMINAL HYSTERECTOMY     APPLICATION OF CRANIAL NAVIGATION N/A 11/21/2020   Procedure: APPLICATION OF CRANIAL NAVIGATION;  Surgeon: Jadene Pierini, MD;  Location: [facility] OR;  Service: Neurosurgery;  Laterality: N/A;   BREAST BIOPSY Right 2014   BREAST BIOPSY Left 10/29/2019   CHOLECYSTECTOMY  2014   COLONOSCOPY  05/04/2009   prolapsing external hemorrhoids   CRANIOTOMY Left 11/21/2020   Procedure: Left craniotomy for tumor resection;  Surgeon: Jadene Pierini, MD;  Location: [facility] OR;  Service: Neurosurgery;  Laterality: Left;   ENDOMETRIAL ABLATION     HEMORRHOID SURGERY     x 3   TUBAL LIGATION     Social History:  Social History   Socioeconomic History   Marital status: Divorced    Spouse name: Not on file   Number of children: 2   Years of education: Not on file   Highest education level: Associate degree: occupational, Scientist, product/process development, or vocational program  Occupational History   Occupation: accounts IT trainer: [employer]  Tobacco Use    Smoking status: Never   Smokeless tobacco: Never  Vaping Use   Vaping status: Never Used  Substance and Sexual Activity   Alcohol use: No    Alcohol/week: 0.0 standard drinks of alcohol   Drug use: No   Sexual activity: Not on file  Other Topics Concern   Not on file  Social History Narrative   Divorced with 2 children   She works as a Merchandiser, retail in Audiological scientist    Possible TB exposure 2008 (after nodules found)   From [place] lived there and [place]   Never owned birds      Social Determinants of Health   Financial Resource Strain: Medium Risk (06/13/2021)   Overall Financial Resource Strain (CARDIA)    Difficulty of Paying Living Expenses: Somewhat hard  Food Insecurity: Food Insecurity Present (04/23/2021)  Hunger Vital Sign    Worried About Running Out of Food in the Last Year: Sometimes true    Ran Out of Food in the Last Year: Never true  Transportation Needs: No Transportation Needs (06/13/2021)   PRAPARE - Administrator, Civil Service (Medical): No    Lack of Transportation (Non-Medical): No  Physical Activity: Unknown (04/23/2021)   Exercise Vital Sign    Days of Exercise per Week: 0 days    Minutes of Exercise per Session: Not on file  Stress: No Stress Concern Present (04/23/2021)   [employer] of Occupational Health - Occupational Stress Questionnaire    Feeling of Stress : Only a little  Social Connections: Unknown (09/16/2021)   Received from [facility]   Social Network    Social Network: Not on file  Intimate Partner Violence: Unknown (08/08/2021)   Received from [facility]   HITS    Physically Hurt: Not on file    Insult or Talk Down To: Not on file    Threaten Physical Harm: Not on file    Scream or Curse: Not on file   Family History:  Family History  Problem Relation Age of Onset   Diabetes type II Mother    Kidney failure Sister    Diabetes Mellitus II Sister    Colon cancer Maternal Aunt        aunts and uncles   Diabetes Mellitus  II Maternal Aunt    Pancreatic cancer Maternal Aunt    Colon cancer Maternal Uncle    Diabetes Mellitus II Maternal Uncle    Heart disease Maternal Grandmother        aunts, uncles, sister   Diabetes Mellitus II Maternal Grandmother    Lung cancer Other        aunts and uncles    Review of Systems: Constitutional: poor appetite Eyes: blurriness of vision Ears, nose, mouth, throat, and face: Doesn't report sore throat Respiratory: Doesn't report cough, dyspnea or wheezes Cardiovascular: Doesn't report palpitation, chest discomfort  Gastrointestinal:  Doesn't report nausea, constipation, diarrhea GU: Doesn't report incontinence Skin: Doesn't report skin rashes Neurological: Per HPI Musculoskeletal: bilateral shoulder pain Behavioral/Psych: +anxiety  Physical Exam: Vitals:   12/19/22 1034  BP: (!) 148/92  Pulse: 80  Resp: 16  Temp: 97.6 F (36.4 C)  SpO2: 100%    KPS: 70. General: Alert, cooperative, pleasant, in no acute distress Head: Normal EENT: No conjunctival injection or scleral icterus.  Lungs: Resp effort normal Cardiac: Regular rate Abdomen: Non-distended abdomen Skin: No rashes cyanosis or petechiae. Extremities: No clubbing or edema  Neurologic Exam: Mental Status: Awake, alert, attentive to examiner. Oriented to self and environment. Language is notable for modest impairment in fluency.  Impaired short term recall. Cranial Nerves: Visual acuity is grossly normal. Visual fields are full. Extra-ocular movements intact. No ptosis. Face L LMN paresis, chronic. Motor: Tone and bulk are normal. Power is full in both arms and legs. Reflexes are symmetric, no pathologic reflexes present.  Sensory: Intact to light touch Gait: Normal.   Labs: I have reviewed the data as listed    Component Value Date/Time   NA 140 11/21/2022 1001   K 3.9 11/21/2022 1001   CL 105 11/21/2022 1001   CO2 27 11/21/2022 1001   GLUCOSE 92 11/21/2022 1001   BUN 11 11/21/2022 1001    CREATININE 1.02 (H) 11/21/2022 1001   CREATININE 0.92 01/25/2020 1424   CALCIUM 10.1 11/21/2022 1001   PROT 7.7 11/21/2022  1001   ALBUMIN 4.6 11/21/2022 1001   AST 27 11/21/2022 1001   ALT 29 11/21/2022 1001   ALKPHOS 113 11/21/2022 1001   BILITOT 0.9 11/21/2022 1001   GFRNONAA >60 11/21/2022 1001   GFRNONAA 72 01/25/2020 1424   GFRAA 83 01/25/2020 1424   Lab Results  Component Value Date   WBC 3.8 (L) 12/19/2022   NEUTROABS 2.4 12/19/2022   HGB 13.9 12/19/2022   HCT 43.1 12/19/2022   MCV 92.5 12/19/2022   PLT 165 12/19/2022    Assessment/Plan Glioblastoma with isocitrate dehydrogenase gene wildtype [facility]) - Plan: Clinic Appointment Request  TORRE SHUTES is clinically stable today. No new or progressive changes.  Given stability on imagines, long term effects of chemotherapy, chronic cytopenias, patient tolerance and preference, will hold off on further Irinotecan.  We will continue to administer the avastin 10mg /kg, but transition to every 4 weeks.  She is agreeable with this.  Per patient preference, recent upper GI discomfort, patient would prefer to defer today's avastin to next week.  We are agreeable with deferral.  Avastin should be held for the following:  ANC less than 500  Platelets less than 50,000  LFT or creatinine greater than 2x ULN  If clinical concerns/contraindications develop  For seizures, will con't Vimpat 100/100.  Keppra will also continue 1000mg  BID.  We ask that NIYAH FLAMMER return to clinic in 1 week for avastin infusion, and in 4 weeks for Avastin and labs, and MRI brain for review.  All questions were answered. The patient knows to call the clinic with any problems, questions or concerns. No barriers to learning were detected.  The total time spent in the encounter was 30 minutes and more than 50% was on counseling and review of test results   Henreitta Leber, MD Medical Director of Neuro-Oncology [facility]  at [facility] 12/19/22 10:47 AM

## 2022-12-20 ENCOUNTER — Telehealth: Payer: Self-pay | Admitting: Internal Medicine

## 2022-12-20 ENCOUNTER — Other Ambulatory Visit: Payer: Self-pay | Admitting: Internal Medicine

## 2022-12-26 ENCOUNTER — Inpatient Hospital Stay: Payer: BC Managed Care – PPO

## 2022-12-26 ENCOUNTER — Ambulatory Visit: Payer: Self-pay | Admitting: Internal Medicine

## 2022-12-26 ENCOUNTER — Encounter: Payer: Self-pay | Admitting: Internal Medicine

## 2022-12-26 ENCOUNTER — Other Ambulatory Visit: Payer: Self-pay

## 2022-12-26 VITALS — BP 152/100 | HR 88 | Temp 97.7°F | Resp 16 | Wt 166.8 lb

## 2022-12-26 DIAGNOSIS — Z801 Family history of malignant neoplasm of trachea, bronchus and lung: Secondary | ICD-10-CM | POA: Diagnosis not present

## 2022-12-26 DIAGNOSIS — Z800 Family history of malignant neoplasm of digestive organs: Secondary | ICD-10-CM | POA: Diagnosis not present

## 2022-12-26 DIAGNOSIS — C719 Malignant neoplasm of brain, unspecified: Secondary | ICD-10-CM

## 2022-12-26 DIAGNOSIS — Z90710 Acquired absence of both cervix and uterus: Secondary | ICD-10-CM | POA: Diagnosis not present

## 2022-12-26 DIAGNOSIS — Z5111 Encounter for antineoplastic chemotherapy: Secondary | ICD-10-CM | POA: Diagnosis not present

## 2022-12-26 DIAGNOSIS — C712 Malignant neoplasm of temporal lobe: Secondary | ICD-10-CM | POA: Diagnosis not present

## 2022-12-26 DIAGNOSIS — R569 Unspecified convulsions: Secondary | ICD-10-CM | POA: Diagnosis not present

## 2022-12-26 LAB — CBC WITH DIFFERENTIAL (CANCER CENTER ONLY)
Abs Immature Granulocytes: 0.01 10*3/uL (ref 0.00–0.07)
Basophils Absolute: 0 10*3/uL (ref 0.0–0.1)
Basophils Relative: 1 %
Eosinophils Absolute: 0.1 10*3/uL (ref 0.0–0.5)
Eosinophils Relative: 2 %
HCT: 44.6 % (ref 36.0–46.0)
Hemoglobin: 14.4 g/dL (ref 12.0–15.0)
Immature Granulocytes: 0 %
Lymphocytes Relative: 27 %
Lymphs Abs: 1.1 10*3/uL (ref 0.7–4.0)
MCH: 29.8 pg (ref 26.0–34.0)
MCHC: 32.3 g/dL (ref 30.0–36.0)
MCV: 92.1 fL (ref 80.0–100.0)
Monocytes Absolute: 0.4 10*3/uL (ref 0.1–1.0)
Monocytes Relative: 9 %
Neutro Abs: 2.5 10*3/uL (ref 1.7–7.7)
Neutrophils Relative %: 61 %
Platelet Count: 170 10*3/uL (ref 150–400)
RBC: 4.84 MIL/uL (ref 3.87–5.11)
RDW: 15.5 % (ref 11.5–15.5)
WBC Count: 4.1 10*3/uL (ref 4.0–10.5)
nRBC: 0 % (ref 0.0–0.2)

## 2022-12-26 LAB — CMP (CANCER CENTER ONLY)
ALT: 56 U/L — ABNORMAL HIGH (ref 0–44)
AST: 38 U/L (ref 15–41)
Albumin: 4.7 g/dL (ref 3.5–5.0)
Alkaline Phosphatase: 125 U/L (ref 38–126)
Anion gap: 10 (ref 5–15)
BUN: 13 mg/dL (ref 6–20)
CO2: 25 mmol/L (ref 22–32)
Calcium: 10.3 mg/dL (ref 8.9–10.3)
Chloride: 105 mmol/L (ref 98–111)
Creatinine: 1.04 mg/dL — ABNORMAL HIGH (ref 0.44–1.00)
GFR, Estimated: >60 mL/min (ref >60)
Glucose, Bld: 97 mg/dL (ref 70–99)
Potassium: 3.7 mmol/L (ref 3.5–5.1)
Sodium: 140 mmol/L (ref 135–145)
Total Bilirubin: 1.1 mg/dL (ref 0.3–1.2)
Total Protein: 8.2 g/dL — ABNORMAL HIGH (ref 6.5–8.1)

## 2022-12-26 LAB — TOTAL PROTEIN, URINE DIPSTICK: Protein, ur: NEGATIVE mg/dL

## 2022-12-26 MED ORDER — SODIUM CHLORIDE 0.9 % IV SOLN: Freq: Once | INTRAVENOUS | Status: AC

## 2022-12-26 MED ORDER — SODIUM CHLORIDE 0.9 % IV SOLN
10.0000 mg/kg | Freq: Once | INTRAVENOUS | Status: AC
  Administered 2022-12-26: 700 mg via INTRAVENOUS
  Filled 2022-12-26: qty 16

## 2022-12-26 MED ORDER — LORAZEPAM 2 MG/ML IJ SOLN
1.0000 mg | Freq: Once | INTRAMUSCULAR | Status: AC
  Administered 2022-12-26: 1 mg via INTRAVENOUS
  Filled 2022-12-26: qty 1

## 2022-12-26 NOTE — Progress Notes (Signed)
Ok to proceed with Bevacizumab with cmet pending.  Anola Gurney [place], [place], BCPS, BCOP 12/26/2022 4:16 PM

## 2022-12-26 NOTE — Patient Instructions (Addendum)
[facility]  Discharge Instructions: Thank you for choosing [facility] to provide your oncology and hematology care.   If you have a lab appointment with the Cancer Center, please go directly to the Cancer Center and check in at the registration area.   Wear comfortable clothing and clothing appropriate for easy access to any Portacath or PICC line.   We strive to give you quality time with your provider. You may need to reschedule your appointment if you arrive late (15 or more minutes).  Arriving late affects you and other patients whose appointments are after yours.  Also, if you miss three or more appointments without notifying the office, you may be dismissed from the clinic at the provider's discretion.      For prescription refill requests, have your pharmacy contact our office and allow 72 hours for refills to be completed.    Today you received the following chemotherapy and/or immunotherapy agents: MVASI       To help prevent nausea and vomiting after your treatment, we encourage you to take your nausea medication as directed.  BELOW ARE SYMPTOMS THAT SHOULD BE REPORTED IMMEDIATELY: *FEVER GREATER THAN 100.4 F (38 C) OR HIGHER *CHILLS OR SWEATING *NAUSEA AND VOMITING THAT IS NOT CONTROLLED WITH YOUR NAUSEA MEDICATION *UNUSUAL SHORTNESS OF BREATH *UNUSUAL BRUISING OR BLEEDING *URINARY PROBLEMS (pain or burning when urinating, or frequent urination) *BOWEL PROBLEMS (unusual diarrhea, constipation, pain near the anus) TENDERNESS IN MOUTH AND THROAT WITH OR WITHOUT PRESENCE OF ULCERS (sore throat, sores in mouth, or a toothache) UNUSUAL RASH, SWELLING OR PAIN  UNUSUAL VAGINAL DISCHARGE OR ITCHING   Items with * indicate a potential emergency and should be followed up as soon as possible or go to the Emergency Department if any problems should occur.  Please show the CHEMOTHERAPY ALERT CARD or IMMUNOTHERAPY ALERT CARD at check-in  to the Emergency Department and triage nurse.  Should you have questions after your visit or need to cancel or reschedule your appointment, please contact [facility]  Dept: [phone]  and follow the prompts.  Office hours are 8:00 a.m. to 4:30 p.m. Monday - Friday. Please note that voicemails left after 4:00 p.m. may not be returned until the following business day.  We are closed weekends and major holidays. You have access to a nurse at all times for urgent questions. Please call the main number to the clinic Dept: [phone] and follow the prompts.   For any non-urgent questions, you may also contact your provider using MyChart. We now offer e-Visits for anyone 85 and older to request care online for non-urgent symptoms. For details visit [email or web address].   Also download the MyChart app! Go to the app store, search "MyChart", open the app, select , and log in with your MyChart username and password.  Bevacizumab Injection What is this medication? BEVACIZUMAB (be va SIZ yoo mab) treats some types of cancer. It works by blocking a protein that causes cancer cells to grow and multiply. This helps to slow or stop the spread of cancer cells. It is a monoclonal antibody. This medicine may be used for other purposes; ask your health care provider or pharmacist if you have questions. COMMON BRAND NAME(S): Alymsys, Avastin, MVASI, Omer Jack What should I tell my care team before I take this medication? They need to know if you have any of these conditions: Blood clots Coughing up blood Having or recent surgery  Heart failure High blood pressure History of a connection between 2 or more body parts that do not usually connect (fistula) History of a tear in your stomach or intestines Protein in your urine An unusual or allergic reaction to bevacizumab, other medications, foods, dyes, or preservatives Pregnant or trying to get  pregnant Breast-feeding How should I use this medication? This medication is injected into a vein. It is given by your care team in a hospital or clinic setting. Talk to your care team the use of this medication in children. Special care may be needed. Overdosage: If you think you have taken too much of this medicine contact a poison control center or emergency room at once. NOTE: This medicine is only for you. Do not share this medicine with others. What if I miss a dose? Keep appointments for follow-up doses. It is important not to miss your dose. Call your care team if you are unable to keep an appointment. What may interact with this medication? Interactions are not expected. This list may not describe all possible interactions. Give your health care provider a list of all the medicines, herbs, non-prescription drugs, or dietary supplements you use. Also tell them if you smoke, drink alcohol, or use illegal drugs. Some items may interact with your medicine. What should I watch for while using this medication? Your condition will be monitored carefully while you are receiving this medication. You may need blood work while taking this medication. This medication may make you feel generally unwell. This is not uncommon as chemotherapy can affect healthy cells as well as cancer cells. Report any side effects. Continue your course of treatment even though you feel ill unless your care team tells you to stop. This medication may increase your risk to bruise or bleed. Call your care team if you notice any unusual bleeding. Before having surgery, talk to your care team to make sure it is ok. This medication can increase the risk of poor healing of your surgical site or wound. You will need to stop this medication for 28 days before surgery. After surgery, wait at least 28 days before restarting this medication. Make sure the surgical site or wound is healed enough before restarting this medication. Talk  to your care team if questions. Talk to your care team if you may be pregnant. Serious birth defects can occur if you take this medication during pregnancy and for 6 months after the last dose. Contraception is recommended while taking this medication and for 6 months after the last dose. Your care team can help you find the option that works for you. Do not breastfeed while taking this medication and for 6 months after the last dose. This medication can cause infertility. Talk to your care team if you are concerned about your fertility. What side effects may I notice from receiving this medication? Side effects that you should report to your care team as soon as possible: Allergic reactions--skin rash, itching, hives, swelling of the face, lips, tongue, or throat Bleeding--bloody or black, tar-like stools, vomiting blood or brown material that looks like coffee grounds, red or dark brown urine, small red or purple spots on skin, unusual bruising or bleeding Blood clot--pain, swelling, or warmth in the leg, shortness of breath, chest pain Heart attack--pain or tightness in the chest, shoulders, arms, or jaw, nausea, shortness of breath, cold or clammy skin, feeling faint or lightheaded Heart failure--shortness of breath, swelling of the ankles, feet, or hands, sudden weight gain, unusual weakness  or fatigue Increase in blood pressure Infection--fever, chills, cough, sore throat, wounds that don't heal, pain or trouble when passing urine, general feeling of discomfort or being unwell Infusion reactions--chest pain, shortness of breath or trouble breathing, feeling faint or lightheaded Kidney injury--decrease in the amount of urine, swelling of the ankles, hands, or feet Stomach pain that is severe, does not go away, or gets worse Stroke--sudden numbness or weakness of the face, arm, or leg, trouble speaking, confusion, trouble walking, loss of balance or coordination, dizziness, severe headache,  change in vision Sudden and severe headache, confusion, change in vision, seizures, which may be signs of posterior reversible encephalopathy syndrome (PRES) Side effects that usually do not require medical attention (report to your care team if they continue or are bothersome): Back pain Change in taste Diarrhea Dry skin Increased tears Nosebleed This list may not describe all possible side effects. Call your doctor for medical advice about side effects. You may report side effects to FDA at 1-800-FDA-1088. Where should I keep my medication? This medication is given in a hospital or clinic. It will not be stored at home. NOTE: This sheet is a summary. It may not cover all possible information. If you have questions about this medicine, talk to your doctor, pharmacist, or health care provider.  2024 Elsevier/Gold Standard (2021-09-07 00:00:00)

## 2023-01-09 ENCOUNTER — Telehealth: Payer: Self-pay | Admitting: Internal Medicine

## 2023-01-09 ENCOUNTER — Other Ambulatory Visit: Payer: Self-pay | Admitting: Internal Medicine

## 2023-01-09 ENCOUNTER — Ambulatory Visit (HOSPITAL_COMMUNITY)
Admission: RE | Admit: 2023-01-09 | Discharge: 2023-01-09 | Disposition: A | Discharge status: Home / Self Care | Payer: BC Managed Care – PPO | Source: Ambulatory Visit | Attending: Internal Medicine | Admitting: Internal Medicine

## 2023-01-09 DIAGNOSIS — I10 Essential (primary) hypertension: Secondary | ICD-10-CM

## 2023-01-09 DIAGNOSIS — C719 Malignant neoplasm of brain, unspecified: Secondary | ICD-10-CM | POA: Diagnosis not present

## 2023-01-09 DIAGNOSIS — C729 Malignant neoplasm of central nervous system, unspecified: Secondary | ICD-10-CM | POA: Diagnosis not present

## 2023-01-09 MED ORDER — GADOBUTROL 1 MMOL/ML IV SOLN
8.0000 mL | Freq: Once | INTRAVENOUS | Status: AC | PRN
  Administered 2023-01-09: 8 mL via INTRAVENOUS

## 2023-01-13 ENCOUNTER — Inpatient Hospital Stay: Payer: BC Managed Care – PPO | Attending: Internal Medicine | Admitting: Internal Medicine

## 2023-01-13 VITALS — BP 147/104 | HR 89 | Temp 97.9°F | Resp 17 | Wt 169.8 lb

## 2023-01-13 DIAGNOSIS — R569 Unspecified convulsions: Secondary | ICD-10-CM | POA: Diagnosis not present

## 2023-01-13 DIAGNOSIS — R5383 Other fatigue: Secondary | ICD-10-CM | POA: Insufficient documentation

## 2023-01-13 DIAGNOSIS — R6884 Jaw pain: Secondary | ICD-10-CM | POA: Diagnosis not present

## 2023-01-13 DIAGNOSIS — R410 Disorientation, unspecified: Secondary | ICD-10-CM | POA: Diagnosis not present

## 2023-01-13 DIAGNOSIS — C712 Malignant neoplasm of temporal lobe: Secondary | ICD-10-CM | POA: Insufficient documentation

## 2023-01-13 DIAGNOSIS — Z801 Family history of malignant neoplasm of trachea, bronchus and lung: Secondary | ICD-10-CM | POA: Insufficient documentation

## 2023-01-13 DIAGNOSIS — Z79899 Other long term (current) drug therapy: Secondary | ICD-10-CM | POA: Insufficient documentation

## 2023-01-13 DIAGNOSIS — C719 Malignant neoplasm of brain, unspecified: Secondary | ICD-10-CM

## 2023-01-13 DIAGNOSIS — I10 Essential (primary) hypertension: Secondary | ICD-10-CM | POA: Insufficient documentation

## 2023-01-13 DIAGNOSIS — Z5111 Encounter for antineoplastic chemotherapy: Secondary | ICD-10-CM | POA: Diagnosis not present

## 2023-01-13 DIAGNOSIS — Z800 Family history of malignant neoplasm of digestive organs: Secondary | ICD-10-CM | POA: Diagnosis not present

## 2023-01-13 NOTE — Progress Notes (Signed)
[facility] [address]  [place], [place] [address] [phone]   Interval Evaluation  Date of Service: 01/13/23 Patient Name: [name] Patient MRN: [id number] Patient DOB: January 23, 1966 Provider: Henreitta Leber, MD  Identifying Statement:  [name] is a 57 y.o. 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)   11/21/2020 Cancer Staging   Staging form: Brain and Spinal Cord, AJCC 8th Edition - Pathologic stage from 11/21/2020: WHO Grade IV - Signed by Henreitta Leber, MD on 11/30/2020 Histopathologic type: Glioblastoma Stage prefix: Initial diagnosis Histologic grading system: 4 grade system Extent of surgical resection: Subtotal resection Karnofsky performance status: Score 90 Seizures at presentation: Present Duration of symptoms before diagnosis: Short IDH1 mutation: Negative   12/27/2020 - 02/07/2021 Radiation Therapy   IMRT and concurrent Temodar Basilio [place])   03/12/2021 - 08/26/2021 Chemotherapy   Completes 5 cycles of adjuvant 5-day Temodar   08/27/2021 Progression   POD #1   09/06/2021 -  Chemotherapy   Initiates second line therapy; CCNU 90mg /m2 PO q6 weeks and avastin 10mg /kg IV q2 weeks   12/28/2021 Progression   POD #2   01/17/2022 -  Chemotherapy   Third line therapy with Irinotecan and Avastin q2 weeks     Biomarkers:  MGMT Methylated.  IDH 1/2 Wild type.  EGFR Unknown  TERT Unknown   Interval History: [name] presents today for follow up after recent MRI brain. Today she complains of jaw pain and soreness on the left side.  She continues to have difficulty getting words out as prior.  This tends to come and go.  Short term memory impairment and fatigue are still persistent.  She is now working from home 4  hours per day without issue.    H+P (11/25/20) Patient presented to medical attention this past month with first ever seizure.  She describes episode of sudden onset "disconnection" with tongue biting and urination, followed by period of confusion.  CNS imaging demonstrated enhancing mass within left anterior temporal lobe, which was mostly resected by Dr. Maurice Small on 11/21/20.  Since surgery she had not had recurrence of seizures.  She complains of painful cramping of her right leg, which started ~1 week prior.  At this time the cramping is her main complaint.  Otherwise fully functional, independent, would like to return to work if possible.  Medications: Current Outpatient Medications on File Prior to Visit  Medication Sig Dispense Refill   amLODipine (NORVASC) 10 MG tablet TAKE 1 TABLET BY MOUTH EVERY DAY 90 tablet 1   HYDROcodone bit-homatropine (HYCODAN) 5-1.5 MG/5ML syrup Take 5 mLs by mouth every 8 (eight) hours as needed for cough. 120 mL 0   Lacosamide 100 MG TABS TAKE 1 TABLET BY MOUTH TWICE A DAY 60 tablet 2   levETIRAcetam (KEPPRA) 750 MG tablet TAKE 2 TABLETS (1,500 MG TOTAL) BY MOUTH 2 (TWO) TIMES DAILY. 360 tablet 1   levothyroxine (SYNTHROID) 100 MCG tablet Take 1 tablet (100 mcg total) by mouth daily. 90 tablet 3   LORazepam (ATIVAN) 2 MG tablet Take 1 tablet (2 mg total) by mouth every 8 (eight) hours as needed for seizure. 12 tablet 0   ondansetron (ZOFRAN-ODT) 4 MG disintegrating tablet Take 1 tablet (4 mg total) by mouth every 8 (eight) hours as needed for nausea or vomiting. 10  tablet 0   Potassium Chloride ER 20 MEQ TBCR TAKE 1 TABLET BY MOUTH EVERY DAY 90 tablet 3   prochlorperazine (COMPAZINE) 10 MG tablet Take 10 mg by mouth every 6 (six) hours as needed for nausea or vomiting.     No current facility-administered medications on file prior to visit.    Allergies: No Known Allergies Past Medical History:  Past Medical History:  Diagnosis Date   Bell's palsy    left  side of face- notied in smile and eyes, if very tired   Brain cancer ([name])    Complication of anesthesia    N/V   Essential hypertension    External hemorrhoids    History of blood transfusion    post op Hemorroids   History of pneumonia    Hyperlipidemia    Hyperthyroidism    hx 57 years old- oral meds   Iron deficiency anemia    Migraine headache    Pneumonia    x 2 - years ago   PONV (postoperative nausea and vomiting)    PPD positive    Pulmonary nodules    not seen on plain cxr, first detected 1/07 re ct 10/08 pos IPPD > 15 mm 12/09 FOB/lavage 04/20/08 neg afb smear   PVC's (premature ventricular contractions)    Seizure ([name]) 11/15/2020   Sleep apnea    refused CPAP- mouth piece recommended   Tuberculosis    in her late 30's   Past Surgical History:  Past Surgical History:  Procedure Laterality Date   ABDOMINAL HYSTERECTOMY     APPLICATION OF CRANIAL NAVIGATION N/A 11/21/2020   Procedure: APPLICATION OF CRANIAL NAVIGATION;  Surgeon: Jadene Pierini, MD;  Location: [facility] OR;  Service: Neurosurgery;  Laterality: N/A;   BREAST BIOPSY Right 2014   BREAST BIOPSY Left 10/29/2019   CHOLECYSTECTOMY  2014   COLONOSCOPY  05/04/2009   prolapsing external hemorrhoids   CRANIOTOMY Left 11/21/2020   Procedure: Left craniotomy for tumor resection;  Surgeon: Jadene Pierini, MD;  [facility] OR;  Service: Neurosurgery;  Laterality: Left;   ENDOMETRIAL ABLATION     HEMORRHOID SURGERY     x 3   TUBAL LIGATION     Social History:  Social History   Socioeconomic History   Marital status: Divorced    Spouse name: Not on file   Number of children: 2   Years of education: Not on file   Highest education level: Associate degree: occupational, Scientist, product/process development, or vocational program  Occupational History   Occupation: accounts IT trainer: [employer]  Tobacco Use   Smoking status: Never   Smokeless tobacco: Never  Vaping Use   Vaping status: Never Used  Substance and  Sexual Activity   Alcohol use: No    Alcohol/week: 0.0 standard drinks of alcohol   Drug use: No   Sexual activity: Not on file  Other Topics Concern   Not on file  Social History Narrative   Divorced with 2 children   She works as a Merchandiser, retail in Audiological scientist    Possible TB exposure 2008 (after nodules found)   From [place] lived there and [place]   Never owned birds      Social Determinants of Health   Financial Resource Strain: Medium Risk (06/13/2021)   Overall Financial Resource Strain (CARDIA)    Difficulty of Paying Living Expenses: Somewhat hard  Food Insecurity: Food Insecurity Present (04/23/2021)   Hunger Vital Sign    Worried About Running Out of  Food in the Last Year: Sometimes true    Ran Out of Food in the Last Year: Never true  Transportation Needs: No Transportation Needs (06/13/2021)   PRAPARE - Administrator, Civil Service (Medical): No    Lack of Transportation (Non-Medical): No  Physical Activity: Unknown (04/23/2021)   Exercise Vital Sign    Days of Exercise per Week: 0 days    Minutes of Exercise per Session: Not on file  Stress: No Stress Concern Present (04/23/2021)   [employer] of Occupational Health - Occupational Stress Questionnaire    Feeling of Stress : Only a little  Social Connections: Unknown (09/16/2021)   Received from [facility]   Social Network    Social Network: Not on file  Intimate Partner Violence: Unknown (08/08/2021)   Received from [facility], [facility]   HITS    Physically Hurt: Not on file    Insult or Talk Down To: Not on file    Threaten Physical Harm: Not on file    Scream or Curse: Not on file   Family History:  Family History  Problem Relation Age of Onset   Diabetes type II Mother    Kidney failure Sister    Diabetes Mellitus II Sister    Colon cancer Maternal Aunt        aunts and uncles   Diabetes Mellitus II Maternal Aunt    Pancreatic cancer Maternal Aunt    Colon cancer Maternal  Uncle    Diabetes Mellitus II Maternal Uncle    Heart disease Maternal Grandmother        aunts, uncles, sister   Diabetes Mellitus II Maternal Grandmother    Lung cancer Other        aunts and uncles    Review of Systems: Constitutional: poor appetite Eyes: blurriness of vision Ears, nose, mouth, throat, and face: Doesn't report sore throat Respiratory: Doesn't report cough, dyspnea or wheezes Cardiovascular: Doesn't report palpitation, chest discomfort  Gastrointestinal:  Doesn't report nausea, constipation, diarrhea GU: Doesn't report incontinence Skin: Doesn't report skin rashes Neurological: Per HPI Musculoskeletal: bilateral shoulder pain Behavioral/Psych: +anxiety  Physical Exam: Vitals:   01/13/23 1115 01/13/23 1116  BP: (!) 157/101 (!) 147/104  Pulse: 89   Resp: 17   Temp: 97.9 F (36.6 C)   SpO2: 100%     KPS: 70. General: Alert, cooperative, pleasant, in no acute distress Head: Normal EENT: No conjunctival injection or scleral icterus.  Lungs: Resp effort normal Cardiac: Regular rate Abdomen: Non-distended abdomen Skin: No rashes cyanosis or petechiae. Extremities: No clubbing or edema  Neurologic Exam: Mental Status: Awake, alert, attentive to examiner. Oriented to self and environment. Language is notable for modest impairment in fluency.  Impaired short term recall. Cranial Nerves: Visual acuity is grossly normal. Visual fields are full. Extra-ocular movements intact. No ptosis. Face L LMN paresis, chronic. Motor: Tone and bulk are normal. Power is full in both arms and legs. Reflexes are symmetric, no pathologic reflexes present.  Sensory: Intact to light touch Gait: Normal.   Labs: I have reviewed the data as listed    Component Value Date/Time   NA 140 12/26/2022 1533   K 3.7 12/26/2022 1533   CL 105 12/26/2022 1533   CO2 25 12/26/2022 1533   GLUCOSE 97 12/26/2022 1533   BUN 13 12/26/2022 1533   CREATININE 1.04 (H) 12/26/2022 1533    CREATININE 0.92 01/25/2020 1424   CALCIUM 10.3 12/26/2022 1533   PROT 8.2 (  H) 12/26/2022 1533   ALBUMIN 4.7 12/26/2022 1533   AST 38 12/26/2022 1533   ALT 56 (H) 12/26/2022 1533   ALKPHOS 125 12/26/2022 1533   BILITOT 1.1 12/26/2022 1533   GFRNONAA >60 12/26/2022 1533   GFRNONAA 72 01/25/2020 1424   GFRAA 83 01/25/2020 1424   Lab Results  Component Value Date   WBC 4.1 12/26/2022   NEUTROABS 2.5 12/26/2022   HGB 14.4 12/26/2022   HCT 44.6 12/26/2022   MCV 92.1 12/26/2022   PLT 170 12/26/2022   Imaging:  [facility] Clinician Interpretation: I have personally reviewed the CNS images as listed.  My interpretation, in the context of the patient's clinical presentation, is stable disease pending official read  No results found.   Assessment/Plan 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)  Focal seizures ([name])  [name] is clinically stable today. MRI brain demonstrate stable findings, pending official read.  Given stability on imagines, long term effects of chemotherapy, chronic cytopenias, patient tolerance and preference, will hold off on further Irinotecan.  We will continue to administer the avastin 10mg /kg, but transition to every 4 weeks.  She is agreeable with this.  Avastin should be held for the following:  ANC less than 500  Platelets less than 50,000  LFT or creatinine greater than 2x ULN  If clinical concerns/contraindications develop  For seizures, will con't Vimpat 100/100.  Keppra will also continue 1000mg  BID.  She will visit PCP for further management of hypertension.  We ask that [name] return to clinic in 2 weeks for avastin infusion and labs.  All questions were answered. The patient knows to call the clinic with any problems, questions or concerns. No barriers to learning were detected.  The total time spent in the encounter was 40 minutes and more than 50% was on counseling and review of test  results   Henreitta Leber, MD Medical Director of Neuro-Oncology [facility] 01/13/23 11:53 AM

## 2023-01-27 ENCOUNTER — Telehealth: Payer: Self-pay | Admitting: *Deleted

## 2023-01-27 NOTE — Progress Notes (Unsigned)
Palliative Medicine [facility]  [phone] [phone]   Name: [name] Date: 01/27/2023 MRN: [id number]  DOB: [birth year]  Patient Care Team: Patient, No Pcp Per as PCP - General (General Practice) Trilby Leaver, MD as Referring Physician (Gastroenterology) Malachy Mood, MD as Consulting Physician (Oncology) Barbaraann Cao Georgeanna Lea, MD as Consulting Physician (Oncology)   INTERVAL HISTORY: [name] is a 57 y.o. female with medical history of glioblastoma s/p resection (11/2020) IMRT, currently on dexamethasone and Temodar, pancreatic lesion, hypertension, and hyperlipidemia.  Palliative ask to see for symptom management and goals of care.   SOCIAL HISTORY:    [name] reports that she has never smoked. She has never used smokeless tobacco. She reports that she does not drink alcohol and does not use drugs.  ADVANCE DIRECTIVES:  None on file  CODE STATUS: Full code  PAST MEDICAL HISTORY: Past Medical History:  Diagnosis Date   Bell's palsy    left side of face- notied in smile and eyes, if very tired   Brain cancer ([name])    Complication of anesthesia    N/V   Essential hypertension    External hemorrhoids    History of blood transfusion    post op Hemorroids   History of pneumonia    Hyperlipidemia    Hyperthyroidism    hx 57 years old- oral meds   Iron deficiency anemia    Migraine headache    Pneumonia    x 2 - years ago   PONV (postoperative nausea and vomiting)    PPD positive    Pulmonary nodules    not seen on plain cxr, first detected 1/07 re ct 10/08 pos IPPD > 15 mm 12/09 FOB/lavage 04/20/08 neg afb smear   PVC's (premature ventricular contractions)    Seizure ([name]) 11/15/2020   Sleep apnea    refused CPAP- mouth piece recommended   Tuberculosis    in her late 30's    ALLERGIES:  has No Known Allergies.  MEDICATIONS:  Current Outpatient Medications  Medication Sig Dispense Refill   amLODipine  (NORVASC) 10 MG tablet TAKE 1 TABLET BY MOUTH EVERY DAY 90 tablet 1   HYDROcodone bit-homatropine (HYCODAN) 5-1.5 MG/5ML syrup Take 5 mLs by mouth every 8 (eight) hours as needed for cough. 120 mL 0   Lacosamide 100 MG TABS TAKE 1 TABLET BY MOUTH TWICE A DAY 60 tablet 2   levETIRAcetam (KEPPRA) 750 MG tablet TAKE 2 TABLETS (1,500 MG TOTAL) BY MOUTH 2 (TWO) TIMES DAILY. 360 tablet 1   levothyroxine (SYNTHROID) 100 MCG tablet Take 1 tablet (100 mcg total) by mouth daily. 90 tablet 3   LORazepam (ATIVAN) 2 MG tablet Take 1 tablet (2 mg total) by mouth every 8 (eight) hours as needed for seizure. 12 tablet 0   ondansetron (ZOFRAN-ODT) 4 MG disintegrating tablet Take 1 tablet (4 mg total) by mouth every 8 (eight) hours as needed for nausea or vomiting. 10 tablet 0   Potassium Chloride ER 20 MEQ TBCR TAKE 1 TABLET BY MOUTH EVERY DAY 90 tablet 3   prochlorperazine (COMPAZINE) 10 MG tablet Take 10 mg by mouth every 6 (six) hours as needed for nausea or vomiting.     No current facility-administered medications for this visit.    VITAL SIGNS: There were no vitals taken for this visit. There were no vitals filed for this visit.  Estimated body mass index is 26.59 kg/m as calculated from the following:  Height as of 10/10/22: 5\' 7"  (1.702 m).   Weight as of 01/13/23: 169 lb 12.8 oz (77 kg).   PERFORMANCE STATUS (ECOG) : 1 - Symptomatic but completely ambulatory  Assessment NAD RRR Normal breathing pattern AAO x4, slow response   IMPRESSION:  [name] presents to clinic for follow-up. No acute distress. Her daughter is present. She is doing well overall. Continues to work part-time hours. Shares by the end of the week she can only work a few hours due to exhaustion and fatigue.  Denies nausea, vomiting, constipation, or diarrhea.  Is trying to remain as active as possible.  Seizure Management: Well-managed on current regimen including Ativan as needed and Keppra.  Goals of Care: [name]  expresses the desire to live each day to its fullest. She is concerned that the new lesions on top of her head represent cancer spread. She expresses being frustrated with seizure episodes as well as a decreased ability to concentrate. Emotional support provided through active listening and quiet presence.   PLAN: No symptom management needs at this time Will continue to monitor closely and adjust regimen as needed. Follow-up in 4-6 weeks coordinated with upcoming oncology appointments.  Patient knows to contact the office if needed.  Patient expressed understanding and was in agreement with this plan. She also understands that She can call the clinic at any time with any questions, concerns, or complaints.   Visit consisted of counseling and education dealing with the complex and emotionally intense issues of symptom management and palliative care in the setting of serious and potentially life-threatening illness.Greater than 50%  of this time was spent counseling and coordinating care related to the above assessment and plan.  [name], AGPCNP-BC  Palliative Medicine Team/[facility]  *Please note that this is a verbal dictation therefore any spelling or grammatical errors are due to the "Dragon Medical One" system interpretation.

## 2023-01-27 NOTE — Telephone Encounter (Signed)
Contacted [facility] read room following VM from Diane. Reached Ruby. She stated patient's MRI was performed 01/09/23 and was read 01/26/23. Results are in patient's chart. Confirmed final results in chart. Dr. Barbaraann Cao informed.

## 2023-01-30 ENCOUNTER — Inpatient Hospital Stay (HOSPITAL_BASED_OUTPATIENT_CLINIC_OR_DEPARTMENT_OTHER): Payer: BC Managed Care – PPO | Admitting: Nurse Practitioner

## 2023-01-30 ENCOUNTER — Inpatient Hospital Stay: Payer: BC Managed Care – PPO

## 2023-01-30 ENCOUNTER — Inpatient Hospital Stay: Payer: BC Managed Care – PPO | Admitting: Internal Medicine

## 2023-01-30 ENCOUNTER — Encounter: Payer: Self-pay | Admitting: Nurse Practitioner

## 2023-01-30 ENCOUNTER — Other Ambulatory Visit: Payer: Self-pay | Admitting: *Deleted

## 2023-01-30 VITALS — BP 148/78

## 2023-01-30 VITALS — BP 153/98 | HR 87 | Temp 97.9°F | Resp 20 | Wt 169.7 lb

## 2023-01-30 DIAGNOSIS — R6884 Jaw pain: Secondary | ICD-10-CM | POA: Diagnosis not present

## 2023-01-30 DIAGNOSIS — Z800 Family history of malignant neoplasm of digestive organs: Secondary | ICD-10-CM | POA: Diagnosis not present

## 2023-01-30 DIAGNOSIS — I10 Essential (primary) hypertension: Secondary | ICD-10-CM | POA: Diagnosis not present

## 2023-01-30 DIAGNOSIS — Z801 Family history of malignant neoplasm of trachea, bronchus and lung: Secondary | ICD-10-CM | POA: Diagnosis not present

## 2023-01-30 DIAGNOSIS — R530 Neoplastic (malignant) related fatigue: Secondary | ICD-10-CM

## 2023-01-30 DIAGNOSIS — R569 Unspecified convulsions: Secondary | ICD-10-CM | POA: Diagnosis not present

## 2023-01-30 DIAGNOSIS — R410 Disorientation, unspecified: Secondary | ICD-10-CM | POA: Diagnosis not present

## 2023-01-30 DIAGNOSIS — C719 Malignant neoplasm of brain, unspecified: Secondary | ICD-10-CM

## 2023-01-30 DIAGNOSIS — Z515 Encounter for palliative care: Secondary | ICD-10-CM | POA: Diagnosis not present

## 2023-01-30 DIAGNOSIS — C712 Malignant neoplasm of temporal lobe: Secondary | ICD-10-CM | POA: Diagnosis not present

## 2023-01-30 DIAGNOSIS — Z5111 Encounter for antineoplastic chemotherapy: Secondary | ICD-10-CM | POA: Diagnosis not present

## 2023-01-30 DIAGNOSIS — Z79899 Other long term (current) drug therapy: Secondary | ICD-10-CM | POA: Diagnosis not present

## 2023-01-30 DIAGNOSIS — R5383 Other fatigue: Secondary | ICD-10-CM | POA: Diagnosis not present

## 2023-01-30 LAB — CBC WITH DIFFERENTIAL (CANCER CENTER ONLY)
Abs Immature Granulocytes: 0.01 10*3/uL (ref 0.00–0.07)
Basophils Absolute: 0 10*3/uL (ref 0.0–0.1)
Basophils Relative: 0 %
Eosinophils Absolute: 0.1 10*3/uL (ref 0.0–0.5)
Eosinophils Relative: 3 %
HCT: 40.6 % (ref 36.0–46.0)
Hemoglobin: 13.3 g/dL (ref 12.0–15.0)
Immature Granulocytes: 0 %
Lymphocytes Relative: 27 %
Lymphs Abs: 1.1 10*3/uL (ref 0.7–4.0)
MCH: 30.6 pg (ref 26.0–34.0)
MCHC: 32.8 g/dL (ref 30.0–36.0)
MCV: 93.3 fL (ref 80.0–100.0)
Monocytes Absolute: 0.3 10*3/uL (ref 0.1–1.0)
Monocytes Relative: 7 %
Neutro Abs: 2.7 10*3/uL (ref 1.7–7.7)
Neutrophils Relative %: 63 %
Platelet Count: 165 10*3/uL (ref 150–400)
RBC: 4.35 MIL/uL (ref 3.87–5.11)
RDW: 15.2 % (ref 11.5–15.5)
WBC Count: 4.3 10*3/uL (ref 4.0–10.5)
nRBC: 0 % (ref 0.0–0.2)

## 2023-01-30 LAB — CMP (CANCER CENTER ONLY)
ALT: 25 U/L (ref 0–44)
AST: 20 U/L (ref 15–41)
Albumin: 4.4 g/dL (ref 3.5–5.0)
Alkaline Phosphatase: 130 U/L — ABNORMAL HIGH (ref 38–126)
Anion gap: 9 (ref 5–15)
BUN: 13 mg/dL (ref 6–20)
CO2: 25 mmol/L (ref 22–32)
Calcium: 9.6 mg/dL (ref 8.9–10.3)
Chloride: 106 mmol/L (ref 98–111)
Creatinine: 0.95 mg/dL (ref 0.44–1.00)
GFR, Estimated: >60 mL/min (ref >60)
Glucose, Bld: 120 mg/dL — ABNORMAL HIGH (ref 70–99)
Potassium: 3.6 mmol/L (ref 3.5–5.1)
Sodium: 140 mmol/L (ref 135–145)
Total Bilirubin: 0.9 mg/dL (ref 0.3–1.2)
Total Protein: 7.6 g/dL (ref 6.5–8.1)

## 2023-01-30 LAB — TOTAL PROTEIN, URINE DIPSTICK: Protein, ur: 30 mg/dL — AB

## 2023-01-30 MED ORDER — SODIUM CHLORIDE 0.9 % IV SOLN: Freq: Once | INTRAVENOUS | Status: AC

## 2023-01-30 MED ORDER — LORAZEPAM 2 MG/ML IJ SOLN
1.0000 mg | Freq: Once | INTRAMUSCULAR | Status: AC
  Administered 2023-01-30: 1 mg via INTRAVENOUS
  Filled 2023-01-30: qty 1

## 2023-01-30 MED ORDER — POTASSIUM CHLORIDE ER 20 MEQ PO TBCR: 1.0000 | EXTENDED_RELEASE_TABLET | Freq: Every day | ORAL | 3 refills | Status: AC

## 2023-01-30 MED ORDER — SODIUM CHLORIDE 0.9 % IV SOLN
10.0000 mg/kg | Freq: Once | INTRAVENOUS | Status: AC
  Administered 2023-01-30: 700 mg via INTRAVENOUS
  Filled 2023-01-30: qty 12

## 2023-01-30 MED ORDER — PALONOSETRON HCL INJECTION 0.25 MG/5ML
0.2500 mg | Freq: Once | INTRAVENOUS | Status: AC
  Administered 2023-01-30: 0.25 mg via INTRAVENOUS
  Filled 2023-01-30: qty 5

## 2023-01-30 NOTE — Progress Notes (Signed)
Patient has had nausea and would like Aloxi this treatment per [employer].  Pryor Ochoa, PharmD

## 2023-01-30 NOTE — Progress Notes (Signed)
[facility] at [facility] [address]  [place], [place] [address] [phone]   Interval Evaluation  Date of Service: 01/30/23 Patient Name: [name] Patient MRN: [id number] Patient DOB: 04/06/1966 Provider: Henreitta Leber, MD  Identifying Statement:  [name] is a 57 y.o. 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)   11/21/2020 Cancer Staging   Staging form: Brain and Spinal Cord, AJCC 8th Edition - Pathologic stage from 11/21/2020: WHO Grade IV - Signed by Henreitta Leber, MD on 11/30/2020 Histopathologic type: Glioblastoma Stage prefix: Initial diagnosis Histologic grading system: 4 grade system Extent of surgical resection: Subtotal resection Karnofsky performance status: Score 90 Seizures at presentation: Present Duration of symptoms before diagnosis: Short IDH1 mutation: Negative   12/27/2020 - 02/07/2021 Radiation Therapy   IMRT and concurrent Temodar Basilio [place])   03/12/2021 - 08/26/2021 Chemotherapy   Completes 5 cycles of adjuvant 5-day Temodar   08/27/2021 Progression   POD #1   09/06/2021 -  Chemotherapy   Initiates second line therapy; CCNU 90mg /m2 PO q6 weeks and avastin 10mg /kg IV q2 weeks   12/28/2021 Progression   POD #2   01/17/2022 -  Chemotherapy   Third line therapy with Irinotecan and Avastin q2 weeks     Biomarkers:  MGMT Methylated.  IDH 1/2 Wild type.  EGFR Unknown  TERT Unknown   Interval History: [name] presents today for avastin infusion. No new complaints today.  She continues to have difficulty getting words out as prior.  This tends to come and go.  Short term memory impairment and fatigue are still persistent.  She is now working from home 4 hours per day without issue.    H+P (11/25/20)  Patient presented to medical attention this past month with first ever seizure.  She describes episode of sudden onset "disconnection" with tongue biting and urination, followed by period of confusion.  CNS imaging demonstrated enhancing mass within left anterior temporal lobe, which was mostly resected by Dr. Maurice Small on 11/21/20.  Since surgery she had not had recurrence of seizures.  She complains of painful cramping of her right leg, which started ~1 week prior.  At this time the cramping is her main complaint.  Otherwise fully functional, independent, would like to return to work if possible.  Medications: Current Outpatient Medications on File Prior to Visit  Medication Sig Dispense Refill   amLODipine (NORVASC) 10 MG tablet TAKE 1 TABLET BY MOUTH EVERY DAY 90 tablet 1   HYDROcodone bit-homatropine (HYCODAN) 5-1.5 MG/5ML syrup Take 5 mLs by mouth every 8 (eight) hours as needed for cough. 120 mL 0   Lacosamide 100 MG TABS TAKE 1 TABLET BY MOUTH TWICE A DAY 60 tablet 2   levETIRAcetam (KEPPRA) 750 MG tablet TAKE 2 TABLETS (1,500 MG TOTAL) BY MOUTH 2 (TWO) TIMES DAILY. 360 tablet 1   levothyroxine (SYNTHROID) 100 MCG tablet Take 1 tablet (100 mcg total) by mouth daily. 90 tablet 3   LORazepam (ATIVAN) 2 MG tablet Take 1 tablet (2 mg total) by mouth every 8 (eight) hours as needed for seizure. 12 tablet 0   ondansetron (ZOFRAN-ODT) 4 MG disintegrating tablet Take 1 tablet (4 mg total) by mouth every 8 (eight) hours as needed for nausea or vomiting. 10 tablet 0   Potassium Chloride ER 20 MEQ TBCR TAKE 1  TABLET BY MOUTH EVERY DAY 90 tablet 3   prochlorperazine (COMPAZINE) 10 MG tablet Take 10 mg by mouth every 6 (six) hours as needed for nausea or vomiting.     No current facility-administered medications on file prior to visit.    Allergies: No Known Allergies Past Medical History:  Past Medical History:  Diagnosis Date   Bell's palsy    left side of face- notied in smile and eyes, if very  tired   Brain cancer ([name])    Complication of anesthesia    N/V   Essential hypertension    External hemorrhoids    History of blood transfusion    post op Hemorroids   History of pneumonia    Hyperlipidemia    Hyperthyroidism    hx 57 years old- oral meds   Iron deficiency anemia    Migraine headache    Pneumonia    x 2 - years ago   PONV (postoperative nausea and vomiting)    PPD positive    Pulmonary nodules    not seen on plain cxr, first detected 1/07 re ct 10/08 pos IPPD > 15 mm 12/09 FOB/lavage 04/20/08 neg afb smear   PVC's (premature ventricular contractions)    Seizure ([name]) 11/15/2020   Sleep apnea    refused CPAP- mouth piece recommended   Tuberculosis    in her late 30's   Past Surgical History:  Past Surgical History:  Procedure Laterality Date   ABDOMINAL HYSTERECTOMY     APPLICATION OF CRANIAL NAVIGATION N/A 11/21/2020   Procedure: APPLICATION OF CRANIAL NAVIGATION;  Surgeon: Jadene Pierini, MD;  Location: [facility] OR;  Service: Neurosurgery;  Laterality: N/A;   BREAST BIOPSY Right 2014   BREAST BIOPSY Left 10/29/2019   CHOLECYSTECTOMY  2014   COLONOSCOPY  05/04/2009   prolapsing external hemorrhoids   CRANIOTOMY Left 11/21/2020   Procedure: Left craniotomy for tumor resection;  Surgeon: Jadene Pierini, MD;  Location: [facility] OR;  Service: Neurosurgery;  Laterality: Left;   ENDOMETRIAL ABLATION     HEMORRHOID SURGERY     x 3   TUBAL LIGATION     Social History:  Social History   Socioeconomic History   Marital status: Divorced    Spouse name: Not on file   Number of children: 2   Years of education: Not on file   Highest education level: Associate degree: occupational, Scientist, product/process development, or vocational program  Occupational History   Occupation: accounts IT trainer: [employer]  Tobacco Use   Smoking status: Never   Smokeless tobacco: Never  Vaping Use   Vaping status: Never Used  Substance and Sexual Activity   Alcohol use: No     Alcohol/week: 0.0 standard drinks of alcohol   Drug use: No   Sexual activity: Not on file  Other Topics Concern   Not on file  Social History Narrative   Divorced with 2 children   She works as a Merchandiser, retail in Audiological scientist    Possible TB exposure 2008 (after nodules found)   From [place] lived there and [place]   Never owned birds      Social Determinants of Health   Financial Resource Strain: Medium Risk (06/13/2021)   Overall Financial Resource Strain (CARDIA)    Difficulty of Paying Living Expenses: Somewhat hard  Food Insecurity: Food Insecurity Present (04/23/2021)   Hunger Vital Sign    Worried About Running Out of Food in the Last Year: Sometimes true    Ran Out  of Food in the Last Year: Never true  Transportation Needs: No Transportation Needs (06/13/2021)   PRAPARE - Administrator, Civil Service (Medical): No    Lack of Transportation (Non-Medical): No  Physical Activity: Unknown (04/23/2021)   Exercise Vital Sign    Days of Exercise per Week: 0 days    Minutes of Exercise per Session: Not on file  Stress: No Stress Concern Present (04/23/2021)   [employer] of Occupational Health - Occupational Stress Questionnaire    Feeling of Stress : Only a little  Social Connections: Unknown (09/16/2021)   Received from [facility]   Social Network    Social Network: Not on file  Intimate Partner Violence: Unknown (08/08/2021)   Received from [facility], [facility]   HITS    Physically Hurt: Not on file    Insult or Talk Down To: Not on file    Threaten Physical Harm: Not on file    Scream or Curse: Not on file   Family History:  Family History  Problem Relation Age of Onset   Diabetes type II Mother    Kidney failure Sister    Diabetes Mellitus II Sister    Colon cancer Maternal Aunt        aunts and uncles   Diabetes Mellitus II Maternal Aunt    Pancreatic cancer Maternal Aunt    Colon cancer Maternal Uncle    Diabetes Mellitus II  Maternal Uncle    Heart disease Maternal Grandmother        aunts, uncles, sister   Diabetes Mellitus II Maternal Grandmother    Lung cancer Other        aunts and uncles    Review of Systems: Constitutional: poor appetite Eyes: blurriness of vision Ears, nose, mouth, throat, and face: Doesn't report sore throat Respiratory: Doesn't report cough, dyspnea or wheezes Cardiovascular: Doesn't report palpitation, chest discomfort  Gastrointestinal:  Doesn't report nausea, constipation, diarrhea GU: Doesn't report incontinence Skin: Doesn't report skin rashes Neurological: Per HPI Musculoskeletal: bilateral shoulder pain Behavioral/Psych: +anxiety  Physical Exam: Vitals:   01/30/23 1040  BP: (!) 153/98  Pulse: 87  Resp: 20  Temp: 97.9 F (36.6 C)  SpO2: 100%     KPS: 70. General: Alert, cooperative, pleasant, in no acute distress Head: Normal EENT: No conjunctival injection or scleral icterus.  Lungs: Resp effort normal Cardiac: Regular rate Abdomen: Non-distended abdomen Skin: No rashes cyanosis or petechiae. Extremities: No clubbing or edema  Neurologic Exam: Mental Status: Awake, alert, attentive to examiner. Oriented to self and environment. Language is notable for modest impairment in fluency.  Impaired short term recall. Cranial Nerves: Visual acuity is grossly normal. Visual fields are full. Extra-ocular movements intact. No ptosis. Face L LMN paresis, chronic. Motor: Tone and bulk are normal. Power is full in both arms and legs. Reflexes are symmetric, no pathologic reflexes present.  Sensory: Intact to light touch Gait: Normal.   Labs: I have reviewed the data as listed    Component Value Date/Time   NA 140 12/26/2022 1533   K 3.7 12/26/2022 1533   CL 105 12/26/2022 1533   CO2 25 12/26/2022 1533   GLUCOSE 97 12/26/2022 1533   BUN 13 12/26/2022 1533   CREATININE 1.04 (H) 12/26/2022 1533   CREATININE 0.92 01/25/2020 1424   CALCIUM 10.3 12/26/2022 1533    PROT 8.2 (H) 12/26/2022 1533   ALBUMIN 4.7 12/26/2022 1533   AST 38 12/26/2022 1533   ALT 56 (  H) 12/26/2022 1533   ALKPHOS 125 12/26/2022 1533   BILITOT 1.1 12/26/2022 1533   GFRNONAA >60 12/26/2022 1533   GFRNONAA 72 01/25/2020 1424   GFRAA 83 01/25/2020 1424   Lab Results  Component Value Date   WBC 4.3 01/30/2023   NEUTROABS 2.7 01/30/2023   HGB 13.3 01/30/2023   HCT 40.6 01/30/2023   MCV 93.3 01/30/2023   PLT 165 01/30/2023     Assessment/Plan 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)  Focal seizures ([name])  Paisely HARMONEI CALZADO is clinically stable today.  No new or progressive changes.  Given stability on imaging, long term effects of chemotherapy, chronic cytopenias, patient tolerance and preference, will hold off on further Irinotecan.  We will continue to administer the avastin 10mg /kg, but transition to every 4 weeks.  She is agreeable with this.  Avastin should be held for the following:  ANC less than 500  Platelets less than 50,000  LFT or creatinine greater than 2x ULN  If clinical concerns/contraindications develop  For seizures, will con't Vimpat 100/100.  Keppra will also continue 1000mg  BID.  We ask that CHIVON BATOR return to clinic in 4 weeks for avastin infusion and labs, next MRI brain in 2 months.  All questions were answered. The patient knows to call the clinic with any problems, questions or concerns. No barriers to learning were detected.  The total time spent in the encounter was 30 minutes and more than 50% was on counseling and review of test results   Henreitta Leber, MD Medical Director of Neuro-Oncology [facility] at [facility] 01/30/23 10:44 AM

## 2023-01-30 NOTE — Patient Instructions (Signed)
[facility]  Discharge Instructions: Thank you for choosing [facility] to provide your oncology and hematology care.   If you have a lab appointment with the Cancer Center, please go directly to the Cancer Center and check in at the registration area.   Wear comfortable clothing and clothing appropriate for easy access to any Portacath or PICC line.   We strive to give you quality time with your provider. You may need to reschedule your appointment if you arrive late (15 or more minutes).  Arriving late affects you and other patients whose appointments are after yours.  Also, if you miss three or more appointments without notifying the office, you may be dismissed from the clinic at the provider's discretion.      For prescription refill requests, have your pharmacy contact our office and allow 72 hours for refills to be completed.    Today you received the following chemotherapy and/or immunotherapy agents: MVASI       To help prevent nausea and vomiting after your treatment, we encourage you to take your nausea medication as directed.  BELOW ARE SYMPTOMS THAT SHOULD BE REPORTED IMMEDIATELY: *FEVER GREATER THAN 100.4 F (38 C) OR HIGHER *CHILLS OR SWEATING *NAUSEA AND VOMITING THAT IS NOT CONTROLLED WITH YOUR NAUSEA MEDICATION *UNUSUAL SHORTNESS OF BREATH *UNUSUAL BRUISING OR BLEEDING *URINARY PROBLEMS (pain or burning when urinating, or frequent urination) *BOWEL PROBLEMS (unusual diarrhea, constipation, pain near the anus) TENDERNESS IN MOUTH AND THROAT WITH OR WITHOUT PRESENCE OF ULCERS (sore throat, sores in mouth, or a toothache) UNUSUAL RASH, SWELLING OR PAIN  UNUSUAL VAGINAL DISCHARGE OR ITCHING   Items with * indicate a potential emergency and should be followed up as soon as possible or go to the Emergency Department if any problems should occur.  Please show the CHEMOTHERAPY ALERT CARD or IMMUNOTHERAPY ALERT CARD at check-in  to the Emergency Department and triage nurse.  Should you have questions after your visit or need to cancel or reschedule your appointment, please contact [facility]  Dept: [phone]  and follow the prompts.  Office hours are 8:00 a.m. to 4:30 p.m. Monday - Friday. Please note that voicemails left after 4:00 p.m. may not be returned until the following business day.  We are closed weekends and major holidays. You have access to a nurse at all times for urgent questions. Please call the main number to the clinic Dept: [phone] and follow the prompts.   For any non-urgent questions, you may also contact your provider using MyChart. We now offer e-Visits for anyone 27 and older to request care online for non-urgent symptoms. For details visit [email or web address].   Also download the MyChart app! Go to the app store, search "MyChart", open the app, select [facility], and log in with your MyChart username and password.  Bevacizumab Injection What is this medication? BEVACIZUMAB (be va SIZ yoo mab) treats some types of cancer. It works by blocking a protein that causes cancer cells to grow and multiply. This helps to slow or stop the spread of cancer cells. It is a monoclonal antibody. This medicine may be used for other purposes; ask your health care provider or pharmacist if you have questions. COMMON BRAND NAME(S): Alymsys, Avastin, MVASI, Omer Jack What should I tell my care team before I take this medication? They need to know if you have any of these conditions: Blood clots Coughing up blood Having or recent surgery  Heart failure High blood pressure History of a connection between 2 or more body parts that do not usually connect (fistula) History of a tear in your stomach or intestines Protein in your urine An unusual or allergic reaction to bevacizumab, other medications, foods, dyes, or preservatives Pregnant or trying to get  pregnant Breast-feeding How should I use this medication? This medication is injected into a vein. It is given by your care team in a hospital or clinic setting. Talk to your care team the use of this medication in children. Special care may be needed. Overdosage: If you think you have taken too much of this medicine contact a poison control center or emergency room at once. NOTE: This medicine is only for you. Do not share this medicine with others. What if I miss a dose? Keep appointments for follow-up doses. It is important not to miss your dose. Call your care team if you are unable to keep an appointment. What may interact with this medication? Interactions are not expected. This list may not describe all possible interactions. Give your health care provider a list of all the medicines, herbs, non-prescription drugs, or dietary supplements you use. Also tell them if you smoke, drink alcohol, or use illegal drugs. Some items may interact with your medicine. What should I watch for while using this medication? Your condition will be monitored carefully while you are receiving this medication. You may need blood work while taking this medication. This medication may make you feel generally unwell. This is not uncommon as chemotherapy can affect healthy cells as well as cancer cells. Report any side effects. Continue your course of treatment even though you feel ill unless your care team tells you to stop. This medication may increase your risk to bruise or bleed. Call your care team if you notice any unusual bleeding. Before having surgery, talk to your care team to make sure it is ok. This medication can increase the risk of poor healing of your surgical site or wound. You will need to stop this medication for 28 days before surgery. After surgery, wait at least 28 days before restarting this medication. Make sure the surgical site or wound is healed enough before restarting this medication. Talk  to your care team if questions. Talk to your care team if you may be pregnant. Serious birth defects can occur if you take this medication during pregnancy and for 6 months after the last dose. Contraception is recommended while taking this medication and for 6 months after the last dose. Your care team can help you find the option that works for you. Do not breastfeed while taking this medication and for 6 months after the last dose. This medication can cause infertility. Talk to your care team if you are concerned about your fertility. What side effects may I notice from receiving this medication? Side effects that you should report to your care team as soon as possible: Allergic reactions--skin rash, itching, hives, swelling of the face, lips, tongue, or throat Bleeding--bloody or black, tar-like stools, vomiting blood or brown material that looks like coffee grounds, red or dark brown urine, small red or purple spots on skin, unusual bruising or bleeding Blood clot--pain, swelling, or warmth in the leg, shortness of breath, chest pain Heart attack--pain or tightness in the chest, shoulders, arms, or jaw, nausea, shortness of breath, cold or clammy skin, feeling faint or lightheaded Heart failure--shortness of breath, swelling of the ankles, feet, or hands, sudden weight gain, unusual weakness  or fatigue Increase in blood pressure Infection--fever, chills, cough, sore throat, wounds that don't heal, pain or trouble when passing urine, general feeling of discomfort or being unwell Infusion reactions--chest pain, shortness of breath or trouble breathing, feeling faint or lightheaded Kidney injury--decrease in the amount of urine, swelling of the ankles, hands, or feet Stomach pain that is severe, does not go away, or gets worse Stroke--sudden numbness or weakness of the face, arm, or leg, trouble speaking, confusion, trouble walking, loss of balance or coordination, dizziness, severe headache,  change in vision Sudden and severe headache, confusion, change in vision, seizures, which may be signs of posterior reversible encephalopathy syndrome (PRES) Side effects that usually do not require medical attention (report to your care team if they continue or are bothersome): Back pain Change in taste Diarrhea Dry skin Increased tears Nosebleed This list may not describe all possible side effects. Call your doctor for medical advice about side effects. You may report side effects to FDA at 1-800-FDA-1088. Where should I keep my medication? This medication is given in a hospital or clinic. It will not be stored at home. NOTE: This sheet is a summary. It may not cover all possible information. If you have questions about this medicine, talk to your doctor, pharmacist, or health care provider.  2024 Elsevier/Gold Standard (2021-09-07 00:00:00)

## 2023-02-17 ENCOUNTER — Telehealth: Payer: Self-pay

## 2023-02-17 NOTE — Telephone Encounter (Signed)
Spoke to patient informing her that her [employer] disability documents had been completed and faxed to the company. Fax confirmation received. Copy of documents mailed to patient as requested.

## 2023-02-20 ENCOUNTER — Inpatient Hospital Stay: Payer: BC Managed Care – PPO | Admitting: Internal Medicine

## 2023-02-20 ENCOUNTER — Inpatient Hospital Stay: Payer: BC Managed Care – PPO

## 2023-02-26 NOTE — Progress Notes (Unsigned)
Palliative [facility]  [phone] [phone]) [phone]   Name: [name] Date: 02/26/2023 MRN: [id number]  DOB: Jan 13, 1966  Patient Care Team: Patient, No Pcp Per as PCP - General (General Practice) Trilby Leaver, MD as Referring Physician (Gastroenterology) Malachy Mood, MD as Consulting Physician (Oncology) Barbaraann Cao Georgeanna Lea, MD as Consulting Physician (Oncology)   INTERVAL HISTORY: [name] is a 57 y.o. female with medical history of glioblastoma s/p resection (11/2020) IMRT, currently on dexamethasone and Temodar, pancreatic lesion, hypertension, and hyperlipidemia.  Palliative ask to see for symptom management and goals of care.   SOCIAL HISTORY:    [name] reports that she has never smoked. She has never used smokeless tobacco. She reports that she does not drink alcohol and does not use drugs.  ADVANCE DIRECTIVES:  None on file  CODE STATUS: Full code  PAST MEDICAL HISTORY: Past Medical History:  Diagnosis Date   Bell's palsy    left side of face- notied in smile and eyes, if very tired   Brain cancer ([name])    Complication of anesthesia    N/V   Essential hypertension    External hemorrhoids    History of blood transfusion    post op Hemorroids   History of pneumonia    Hyperlipidemia    Hyperthyroidism    hx 57 years old- oral meds   Iron deficiency anemia    Migraine headache    Pneumonia    x 2 - years ago   PONV (postoperative nausea and vomiting)    PPD positive    Pulmonary nodules    not seen on plain cxr, first detected 1/07 re ct 10/08 pos IPPD > 15 mm 12/09 FOB/lavage 04/20/08 neg afb smear   PVC's (premature ventricular contractions)    Seizure ([name]) 11/15/2020   Sleep apnea    refused CPAP- mouth piece recommended   Tuberculosis    in her late 30's    ALLERGIES:  has No Known Allergies.  MEDICATIONS:  Current Outpatient Medications  Medication Sig Dispense Refill   amLODipine  (NORVASC) 10 MG tablet TAKE 1 TABLET BY MOUTH EVERY DAY 90 tablet 1   HYDROcodone bit-homatropine (HYCODAN) 5-1.5 MG/5ML syrup Take 5 mLs by mouth every 8 (eight) hours as needed for cough. 120 mL 0   Lacosamide 100 MG TABS TAKE 1 TABLET BY MOUTH TWICE A DAY 60 tablet 2   levETIRAcetam (KEPPRA) 750 MG tablet TAKE 2 TABLETS (1,500 MG TOTAL) BY MOUTH 2 (TWO) TIMES DAILY. 360 tablet 1   levothyroxine (SYNTHROID) 100 MCG tablet Take 1 tablet (100 mcg total) by mouth daily. 90 tablet 3   LORazepam (ATIVAN) 2 MG tablet Take 1 tablet (2 mg total) by mouth every 8 (eight) hours as needed for seizure. 12 tablet 0   ondansetron (ZOFRAN-ODT) 4 MG disintegrating tablet Take 1 tablet (4 mg total) by mouth every 8 (eight) hours as needed for nausea or vomiting. 10 tablet 0   Potassium Chloride ER 20 MEQ TBCR Take 1 tablet (20 mEq total) by mouth daily. 90 tablet 3   prochlorperazine (COMPAZINE) 10 MG tablet Take 10 mg by mouth every 6 (six) hours as needed for nausea or vomiting.     No current facility-administered medications for this visit.    VITAL SIGNS: There were no vitals taken for this visit. There were no vitals filed for this visit.  Estimated body mass index is 26.58 kg/m as calculated from the  following:   Height as of 10/10/22: 5\' 7"  (1.702 m).   Weight as of 01/30/23: 169 lb 11.2 oz (77 kg).   PERFORMANCE STATUS (ECOG) : 1 - Symptomatic but completely ambulatory  Assessment NAD RRR Normal breathing pattern AAO x4, slow response   IMPRESSION:  [name] presents to clinic for follow-up. No acute distress. Her daughter is present. She is doing well overall. Continues to work part-time hours. Shares by the end of the week she can only work a few hours due to exhaustion and fatigue.  Denies nausea, vomiting, constipation, or diarrhea.  Is trying to remain as active as possible.  Seizure Management: Well-managed on current regimen including Ativan as needed and Keppra.  Goals of  Care: [name] expresses the desire to live each day to its fullest. She is concerned that the new lesions on top of her head represent cancer spread. She expresses being frustrated with seizure episodes as well as a decreased ability to concentrate. Emotional support provided through active listening and quiet presence.   PLAN: No symptom management needs at this time Will continue to monitor closely and adjust regimen as needed. Follow-up in 4-6 weeks coordinated with upcoming oncology appointments.  Patient knows to contact the office if needed.  Patient expressed understanding and was in agreement with this plan. She also understands that She can call the clinic at any time with any questions, concerns, or complaints.   Visit consisted of counseling and education dealing with the complex and emotionally intense issues of symptom management and palliative care in the setting of serious and potentially life-threatening illness.Greater than 50%  of this time was spent counseling and coordinating care related to the above assessment and plan.  Willette Alma, AGPCNP-BC  Palliative Medicine Team/[facility]  *Please note that this is a verbal dictation therefore any spelling or grammatical errors are due to the "Dragon Medical One" system interpretation.

## 2023-02-27 ENCOUNTER — Inpatient Hospital Stay: Payer: BC Managed Care – PPO | Attending: Internal Medicine

## 2023-02-27 ENCOUNTER — Inpatient Hospital Stay: Payer: BC Managed Care – PPO

## 2023-02-27 ENCOUNTER — Encounter: Payer: Self-pay | Admitting: Nurse Practitioner

## 2023-02-27 ENCOUNTER — Inpatient Hospital Stay (HOSPITAL_BASED_OUTPATIENT_CLINIC_OR_DEPARTMENT_OTHER): Payer: BC Managed Care – PPO | Admitting: Nurse Practitioner

## 2023-02-27 ENCOUNTER — Other Ambulatory Visit: Payer: Self-pay

## 2023-02-27 ENCOUNTER — Inpatient Hospital Stay: Payer: BC Managed Care – PPO | Admitting: Internal Medicine

## 2023-02-27 VITALS — BP 143/86 | HR 88 | Resp 17

## 2023-02-27 VITALS — BP 146/103 | HR 80 | Temp 98.3°F | Resp 18 | Wt 168.4 lb

## 2023-02-27 DIAGNOSIS — Z5111 Encounter for antineoplastic chemotherapy: Secondary | ICD-10-CM | POA: Insufficient documentation

## 2023-02-27 DIAGNOSIS — Z515 Encounter for palliative care: Secondary | ICD-10-CM | POA: Diagnosis not present

## 2023-02-27 DIAGNOSIS — C719 Malignant neoplasm of brain, unspecified: Secondary | ICD-10-CM

## 2023-02-27 DIAGNOSIS — R569 Unspecified convulsions: Secondary | ICD-10-CM

## 2023-02-27 DIAGNOSIS — R530 Neoplastic (malignant) related fatigue: Secondary | ICD-10-CM

## 2023-02-27 DIAGNOSIS — C712 Malignant neoplasm of temporal lobe: Secondary | ICD-10-CM | POA: Insufficient documentation

## 2023-02-27 LAB — CBC WITH DIFFERENTIAL (CANCER CENTER ONLY)
Abs Immature Granulocytes: 0.01 10*3/uL (ref 0.00–0.07)
Basophils Absolute: 0 10*3/uL (ref 0.0–0.1)
Basophils Relative: 1 %
Eosinophils Absolute: 0.1 10*3/uL (ref 0.0–0.5)
Eosinophils Relative: 3 %
HCT: 44.4 % (ref 36.0–46.0)
Hemoglobin: 14.7 g/dL (ref 12.0–15.0)
Immature Granulocytes: 0 %
Lymphocytes Relative: 35 %
Lymphs Abs: 1.5 10*3/uL (ref 0.7–4.0)
MCH: 30.6 pg (ref 26.0–34.0)
MCHC: 33.1 g/dL (ref 30.0–36.0)
MCV: 92.3 fL (ref 80.0–100.0)
Monocytes Absolute: 0.4 10*3/uL (ref 0.1–1.0)
Monocytes Relative: 9 %
Neutro Abs: 2.2 10*3/uL (ref 1.7–7.7)
Neutrophils Relative %: 52 %
Platelet Count: 168 10*3/uL (ref 150–400)
RBC: 4.81 MIL/uL (ref 3.87–5.11)
RDW: 14.6 % (ref 11.5–15.5)
WBC Count: 4.3 10*3/uL (ref 4.0–10.5)
nRBC: 0 % (ref 0.0–0.2)

## 2023-02-27 LAB — CMP (CANCER CENTER ONLY)
ALT: 19 U/L (ref 0–44)
AST: 17 U/L (ref 15–41)
Albumin: 4.5 g/dL (ref 3.5–5.0)
Alkaline Phosphatase: 130 U/L — ABNORMAL HIGH (ref 38–126)
Anion gap: 8 (ref 5–15)
BUN: 15 mg/dL (ref 6–20)
CO2: 26 mmol/L (ref 22–32)
Calcium: 10.1 mg/dL (ref 8.9–10.3)
Chloride: 104 mmol/L (ref 98–111)
Creatinine: 1.04 mg/dL — ABNORMAL HIGH (ref 0.44–1.00)
GFR, Estimated: >60 mL/min (ref >60)
Glucose, Bld: 88 mg/dL (ref 70–99)
Potassium: 3.6 mmol/L (ref 3.5–5.1)
Sodium: 138 mmol/L (ref 135–145)
Total Bilirubin: 1 mg/dL (ref 0.3–1.2)
Total Protein: 7.6 g/dL (ref 6.5–8.1)

## 2023-02-27 LAB — TOTAL PROTEIN, URINE DIPSTICK: Protein, ur: 30 mg/dL — AB

## 2023-02-27 MED ORDER — SODIUM CHLORIDE 0.9 % IV SOLN
10.0000 mg/kg | Freq: Once | INTRAVENOUS | Status: AC
  Administered 2023-02-27: 700 mg via INTRAVENOUS
  Filled 2023-02-27: qty 12

## 2023-02-27 MED ORDER — SODIUM CHLORIDE 0.9 % IV SOLN: Freq: Once | INTRAVENOUS | Status: AC

## 2023-02-27 MED ORDER — PALONOSETRON HCL INJECTION 0.25 MG/5ML
0.2500 mg | Freq: Once | INTRAVENOUS | Status: AC
Start: 2023-02-27 — End: 2023-02-27
  Administered 2023-02-27: 0.25 mg via INTRAVENOUS
  Filled 2023-02-27: qty 5

## 2023-02-27 MED ORDER — LORAZEPAM 2 MG/ML IJ SOLN
1.0000 mg | Freq: Once | INTRAMUSCULAR | Status: AC
  Administered 2023-02-27: 1 mg via INTRAVENOUS
  Filled 2023-02-27: qty 1

## 2023-02-27 MED ORDER — LOSARTAN POTASSIUM 50 MG PO TABS: 50.0000 mg | ORAL_TABLET | Freq: Every day | ORAL | 2 refills | Status: DC

## 2023-02-27 NOTE — Patient Instructions (Signed)
[facility]  Discharge Instructions: Thank you for choosing [facility] to provide your oncology and hematology care.   If you have a lab appointment with the [facility], please go directly to the [facility] and check in at the registration area.   Wear comfortable clothing and clothing appropriate for easy access to any Portacath or PICC line.   We strive to give you quality time with your provider. You may need to reschedule your appointment if you arrive late (15 or more minutes).  Arriving late affects you and other patients whose appointments are after yours.  Also, if you miss three or more appointments without notifying the office, you may be dismissed from the clinic at the provider's discretion.      For prescription refill requests, have your pharmacy contact our office and allow 72 hours for refills to be completed.    Today you received the following chemotherapy and/or immunotherapy agents: Bevacizumab      To help prevent nausea and vomiting after your treatment, we encourage you to take your nausea medication as directed.  BELOW ARE SYMPTOMS THAT SHOULD BE REPORTED IMMEDIATELY: *FEVER GREATER THAN 100.4 F (38 C) OR HIGHER *CHILLS OR SWEATING *NAUSEA AND VOMITING THAT IS NOT CONTROLLED WITH YOUR NAUSEA MEDICATION *UNUSUAL SHORTNESS OF BREATH *UNUSUAL BRUISING OR BLEEDING *URINARY PROBLEMS (pain or burning when urinating, or frequent urination) *BOWEL PROBLEMS (unusual diarrhea, constipation, pain near the anus) TENDERNESS IN MOUTH AND THROAT WITH OR WITHOUT PRESENCE OF ULCERS (sore throat, sores in mouth, or a toothache) UNUSUAL RASH, SWELLING OR PAIN  UNUSUAL VAGINAL DISCHARGE OR ITCHING   Items with * indicate a potential emergency and should be followed up as soon as possible or go to the Emergency Department if any problems should occur.  Please show the CHEMOTHERAPY ALERT CARD or IMMUNOTHERAPY ALERT CARD at  check-in to the Emergency Department and triage nurse.  Should you have questions after your visit or need to cancel or reschedule your appointment, please contact [facility]  Dept: [phone]  and follow the prompts.  Office hours are 8:00 a.m. to 4:30 p.m. Monday - Friday. Please note that voicemails left after 4:00 p.m. may not be returned until the following business day.  We are closed weekends and major holidays. You have access to a nurse at all times for urgent questions. Please call the main number to the clinic Dept: [phone] and follow the prompts.   For any non-urgent questions, you may also contact your provider using MyChart. We now offer e-Visits for anyone 82 and older to request care online for non-urgent symptoms. For details visit [email or web address].   Also download the MyChart app! Go to the app store, search "MyChart", open the app, select [facility], and log in with your MyChart username and password.

## 2023-02-27 NOTE — Progress Notes (Signed)
[facility] [address]  [place], [place] [address] [phone]   Interval Evaluation  Date of Service: 02/27/23 Patient Name: [name] Patient MRN: [id number] Patient DOB: 1965/11/05 Provider: Henreitta Leber, MD  Identifying Statement:  [name] is a 57 y.o. 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)  11/21/2020 Surgery   Left temporal craniotomy, resection with Dr. Maurice Small.  Path is glioblastoma, IDHwt   12/27/2020 - 02/07/2021 Radiation Therapy   IMRT and concurrent Temodar [facility] [place])   03/12/2021 - 08/26/2021 Chemotherapy   Completes 5 cycles of adjuvant 5-day Temodar   08/27/2021 Progression   POD #1   09/06/2021 -  Chemotherapy   Initiates second line therapy; CCNU 90mg /m2 PO q6 weeks and avastin 10mg /kg IV q2 weeks   12/28/2021 Progression   POD #2   01/17/2022 -  Chemotherapy   Third line therapy with Irinotecan and Avastin q2 weeks   11/21/2022 -  Chemotherapy   Transitions to single agent Avastin     Biomarkers:  MGMT Methylated.  IDH 1/2 Wild type.  EGFR Unknown  TERT Unknown   Interval History: CANAAN ROHM presents today for avastin infusion. No new complaints today.  Expressive language continues to decline gradually.  Short term memory impairment and fatigue are still persistent.  She continues to work from home 4 hours per day without issue.    H+P (11/25/20) Patient presented to medical attention this past month with first ever seizure.  She describes episode of sudden onset "disconnection" with tongue biting and urination, followed by period of confusion.  CNS imaging demonstrated enhancing mass within left anterior temporal lobe, which was mostly resected by Dr. Maurice Small on 11/21/20.  Since surgery she had not had recurrence of seizures.  She complains of painful cramping of her right leg, which  started ~1 week prior.  At this time the cramping is her main complaint.  Otherwise fully functional, independent, would like to return to work if possible.  Medications: Current Outpatient Medications on File Prior to Visit  Medication Sig Dispense Refill   amLODipine (NORVASC) 10 MG tablet TAKE 1 TABLET BY MOUTH EVERY DAY 90 tablet 1   HYDROcodone bit-homatropine (HYCODAN) 5-1.5 MG/5ML syrup Take 5 mLs by mouth every 8 (eight) hours as needed for cough. 120 mL 0   Lacosamide 100 MG TABS TAKE 1 TABLET BY MOUTH TWICE A DAY 60 tablet 2   levETIRAcetam (KEPPRA) 750 MG tablet TAKE 2 TABLETS (1,500 MG TOTAL) BY MOUTH 2 (TWO) TIMES DAILY. 360 tablet 1   levothyroxine (SYNTHROID) 100 MCG tablet Take 1 tablet (100 mcg total) by mouth daily. 90 tablet 3   LORazepam (ATIVAN) 2 MG tablet Take 1 tablet (2 mg total) by mouth every 8 (eight) hours as needed for seizure. 12 tablet 0   ondansetron (ZOFRAN-ODT) 4 MG disintegrating tablet Take 1 tablet (4 mg total) by mouth every 8 (eight) hours as needed for nausea or vomiting. 10 tablet 0   Potassium Chloride ER 20 MEQ TBCR Take 1 tablet (20 mEq total) by mouth daily. 90 tablet 3   prochlorperazine (COMPAZINE) 10 MG tablet Take 10 mg by mouth every 6 (six) hours as needed for nausea or vomiting.     No current facility-administered medications on file prior to visit.    Allergies: No Known Allergies Past Medical History:  Past Medical History:  Diagnosis Date  Bell's palsy    left side of face- notied in smile and eyes, if very tired   Brain cancer ([name])    Complication of anesthesia    N/V   Essential hypertension    External hemorrhoids    History of blood transfusion    post op Hemorroids   History of pneumonia    Hyperlipidemia    Hyperthyroidism    hx 57 years old- oral meds   Iron deficiency anemia    Migraine headache    Pneumonia    x 2 - years ago   PONV (postoperative nausea and vomiting)    PPD positive    Pulmonary nodules     not seen on plain cxr, first detected 1/07 re ct 10/08 pos IPPD > 15 mm 12/09 FOB/lavage 04/20/08 neg afb smear   PVC's (premature ventricular contractions)    Seizure ([name]) 11/15/2020   Sleep apnea    refused CPAP- mouth piece recommended   Tuberculosis    in her late 30's   Past Surgical History:  Past Surgical History:  Procedure Laterality Date   ABDOMINAL HYSTERECTOMY     APPLICATION OF CRANIAL NAVIGATION N/A 11/21/2020   Procedure: APPLICATION OF CRANIAL NAVIGATION;  Surgeon: Jadene Pierini, MD;  Location: [facility] OR;  Service: Neurosurgery;  Laterality: N/A;   BREAST BIOPSY Right 2014   BREAST BIOPSY Left 10/29/2019   CHOLECYSTECTOMY  2014   COLONOSCOPY  05/04/2009   prolapsing external hemorrhoids   CRANIOTOMY Left 11/21/2020   Procedure: Left craniotomy for tumor resection;  Surgeon: Jadene Pierini, MD;  Location: [facility] OR;  Service: Neurosurgery;  Laterality: Left;   ENDOMETRIAL ABLATION     HEMORRHOID SURGERY     x 3   TUBAL LIGATION     Social History:  Social History   Socioeconomic History   Marital status: Divorced    Spouse name: Not on file   Number of children: 2   Years of education: Not on file   Highest education level: Associate degree: occupational, Scientist, product/process development, or vocational program  Occupational History   Occupation: accounts IT trainer: [employer]  Tobacco Use   Smoking status: Never   Smokeless tobacco: Never  Vaping Use   Vaping status: Never Used  Substance and Sexual Activity   Alcohol use: No    Alcohol/week: 0.0 standard drinks of alcohol   Drug use: No   Sexual activity: Not on file  Other Topics Concern   Not on file  Social History Narrative   Divorced with 2 children   She works as a Merchandiser, retail in Audiological scientist    Possible TB exposure 2008 (after nodules found)   From [place] lived there and [place]   Never owned birds      Social Determinants of Health   Financial Resource Strain: Medium Risk (06/13/2021)   Overall Financial  Resource Strain (CARDIA)    Difficulty of Paying Living Expenses: Somewhat hard  Food Insecurity: Food Insecurity Present (04/23/2021)   Hunger Vital Sign    Worried About Running Out of Food in the Last Year: Sometimes true    Ran Out of Food in the Last Year: Never true  Transportation Needs: No Transportation Needs (06/13/2021)   PRAPARE - Administrator, Civil Service (Medical): No    Lack of Transportation (Non-Medical): No  Physical Activity: Unknown (04/23/2021)   Exercise Vital Sign    Days of Exercise per Week: 0 days    Minutes of Exercise  per Session: Not on file  Stress: No Stress Concern Present (04/23/2021)   [employer] of Occupational Health - Occupational Stress Questionnaire    Feeling of Stress : Only a little  Social Connections: Unknown (09/16/2021)   Received [facility]   Social Network    Social Network: Not on file  Intimate Partner Violence: Unknown (08/08/2021)   [facility], [facility]   HITS    Physically Hurt: Not on file    Insult or Talk Down To: Not on file    Threaten Physical Harm: Not on file    Scream or Curse: Not on file   Family History:  Family History  Problem Relation Age of Onset   Diabetes type II Mother    Kidney failure Sister    Diabetes Mellitus II Sister    Colon cancer Maternal Aunt        aunts and uncles   Diabetes Mellitus II Maternal Aunt    Pancreatic cancer Maternal Aunt    Colon cancer Maternal Uncle    Diabetes Mellitus II Maternal Uncle    Heart disease Maternal Grandmother        aunts, uncles, sister   Diabetes Mellitus II Maternal Grandmother    Lung cancer Other        aunts and uncles    Review of Systems: Constitutional: poor appetite Eyes: blurriness of vision Ears, nose, mouth, throat, and face: Doesn't report sore throat Respiratory: Doesn't report cough, dyspnea or wheezes Cardiovascular: Doesn't report palpitation, chest discomfort   Gastrointestinal:  Doesn't report nausea, constipation, diarrhea GU: Doesn't report incontinence Skin: Doesn't report skin rashes Neurological: Per HPI Musculoskeletal: bilateral shoulder pain Behavioral/Psych: +anxiety  Physical Exam: Vitals:   02/27/23 0855  BP: (!) 153/101  Pulse: 80  Resp: 18  Temp: 98.3 F (36.8 C)  SpO2: 100%     KPS: 70. General: Alert, cooperative, pleasant, in no acute distress Head: Normal EENT: No conjunctival injection or scleral icterus.  Lungs: Resp effort normal Cardiac: Regular rate Abdomen: Non-distended abdomen Skin: No rashes cyanosis or petechiae. Extremities: No clubbing or edema  Neurologic Exam: Mental Status: Awake, alert, attentive to examiner. Oriented to self and environment. Language is notable for modest impairment in fluency.  Impaired short term recall. Cranial Nerves: Visual acuity is grossly normal. Visual fields are full. Extra-ocular movements intact. No ptosis. Face L LMN paresis, chronic. Motor: Tone and bulk are normal. Power is full in both arms and legs. Reflexes are symmetric, no pathologic reflexes present.  Sensory: Intact to light touch Gait: Normal.   Labs: I have reviewed the data as listed    Component Value Date/Time   NA 140 01/30/2023 1023   K 3.6 01/30/2023 1023   CL 106 01/30/2023 1023   CO2 25 01/30/2023 1023   GLUCOSE 120 (H) 01/30/2023 1023   BUN 13 01/30/2023 1023   CREATININE 0.95 01/30/2023 1023   CREATININE 0.92 01/25/2020 1424   CALCIUM 9.6 01/30/2023 1023   PROT 7.6 01/30/2023 1023   ALBUMIN 4.4 01/30/2023 1023   AST 20 01/30/2023 1023   ALT 25 01/30/2023 1023   ALKPHOS 130 (H) 01/30/2023 1023   BILITOT 0.9 01/30/2023 1023   GFRNONAA >60 01/30/2023 1023   GFRNONAA 72 01/25/2020 1424   GFRAA 83 01/25/2020 1424   Lab Results  Component Value Date   WBC 4.3 02/27/2023   NEUTROABS 2.2 02/27/2023   HGB 14.7 02/27/2023   HCT 44.4 02/27/2023   MCV  92.3 02/27/2023   PLT 168  02/27/2023     Assessment/Plan Focal seizures ([name])  Glioblastoma with isocitrate dehydrogenase gene wildtype [facility]) - Plan: Clinic Appointment Request  HAIDY RESCH is clinically stable today.  No new or progressive changes.  Given stability on imaging, long term effects of chemotherapy, chronic cytopenias, patient tolerance and preference, will hold off on further Irinotecan.  We will continue to administer the avastin 10mg /kg, but transition to every 4 weeks.  She is agreeable with this.  Avastin should be held for the following:  ANC less than 500  Platelets less than 50,000  LFT or creatinine greater than 2x ULN  If clinical concerns/contraindications develop  Blood pressure remains elevated, likely related to avastin.  Will continue Amlodipine 10mg  daily and resume Losartan 50mg  daily, which was well tolerated previously.  She will also visit with her PCP for further titration.  For seizures, will con't Vimpat 100/100.  Keppra will also continue 1000mg  BID.  We ask that ORLENE GURSKY return to clinic in 4 weeks for avastin infusion and labs, next MRI brain in 3 weeks.  All questions were answered. The patient knows to call the clinic with any problems, questions or concerns. No barriers to learning were detected.  The total time spent in the encounter was 40 minutes and more than 50% was on counseling and review of test results   Henreitta Leber, MD Medical Director of Neuro-Oncology [facility] 02/27/23 9:02 AM

## 2023-02-27 NOTE — Progress Notes (Signed)
Patient seen by Dr. Driscilla Grammes are not all within treatment parameters. BP 153/101, 146/103  Labs reviewed: and are within treatment parameters.  Per physician team, patient is ready for treatment and there are NO modifications to the treatment plan.

## 2023-03-05 ENCOUNTER — Telehealth: Payer: Self-pay | Admitting: Adult Health

## 2023-03-05 NOTE — Telephone Encounter (Signed)
Pt called wanted to sched an appt for cough and sore throat that started 3 days ago; as well as wanted to discuss her BP so she can get meds for it. However pt does not want to be tested for COVID in order to be seen (pt stated she has cancer and she feels she is just sick from that) and would rather just do a virtual video.   Let pt know that she would have to be seen in office to discuss BP however pt just wanted to see if she can just do it virtually.   Please advise.

## 2023-03-06 NOTE — Telephone Encounter (Signed)
Please advise 

## 2023-03-06 NOTE — Telephone Encounter (Signed)
Pt stated that she doesn't have a home cuff the reading came from her other doctor. Pt advised that we can see her tomorrow but per protocol she still needs to be tested. Pt wanted me to set her up with an appt. Monday. I advised that ocry doesn't work on Mondays. Pt declined visit for tomorrow. Pt wanted to wait until Tuesday I advised that she may want to get looked at sooner than later. Pt agreed and stated she may go to the UC.

## 2023-03-07 ENCOUNTER — Encounter: Payer: Self-pay | Admitting: Internal Medicine

## 2023-03-07 ENCOUNTER — Telehealth: Payer: Self-pay | Admitting: Internal Medicine

## 2023-03-07 ENCOUNTER — Telehealth: Payer: Self-pay | Admitting: *Deleted

## 2023-03-07 ENCOUNTER — Other Ambulatory Visit: Payer: Self-pay | Admitting: Internal Medicine

## 2023-03-07 DIAGNOSIS — C719 Malignant neoplasm of brain, unspecified: Secondary | ICD-10-CM

## 2023-03-07 NOTE — Telephone Encounter (Signed)
 Left patient a vm regarding upcoming appointment

## 2023-03-07 NOTE — Telephone Encounter (Signed)
PC to patient, informed her insurance has denied her brain MRI scheduled for 03/10/23 & it has been canceled.  Our PA team is appealing this decision & she will be informed once the MRI is approved & scheduled.  Patient verbalizes understanding.

## 2023-03-08 DIAGNOSIS — H5789 Other specified disorders of eye and adnexa: Secondary | ICD-10-CM | POA: Diagnosis not present

## 2023-03-08 DIAGNOSIS — R070 Pain in throat: Secondary | ICD-10-CM | POA: Diagnosis not present

## 2023-03-10 ENCOUNTER — Ambulatory Visit (HOSPITAL_COMMUNITY): Payer: BC Managed Care – PPO

## 2023-03-11 ENCOUNTER — Encounter: Payer: Self-pay | Admitting: Internal Medicine

## 2023-03-11 ENCOUNTER — Other Ambulatory Visit: Payer: Self-pay | Admitting: Internal Medicine

## 2023-03-11 DIAGNOSIS — C719 Malignant neoplasm of brain, unspecified: Secondary | ICD-10-CM

## 2023-03-11 DIAGNOSIS — T381X1D Poisoning by thyroid hormones and substitutes, accidental (unintentional), subsequent encounter: Secondary | ICD-10-CM | POA: Diagnosis not present

## 2023-03-22 ENCOUNTER — Other Ambulatory Visit: Payer: Self-pay | Admitting: Internal Medicine

## 2023-03-24 ENCOUNTER — Ambulatory Visit (HOSPITAL_COMMUNITY)
Admission: RE | Admit: 2023-03-24 | Discharge: 2023-03-24 | Disposition: A | Discharge status: Home / Self Care | Payer: BC Managed Care – PPO | Source: Ambulatory Visit | Attending: Internal Medicine | Admitting: Internal Medicine

## 2023-03-24 ENCOUNTER — Encounter: Payer: Self-pay | Admitting: Internal Medicine

## 2023-03-24 DIAGNOSIS — C719 Malignant neoplasm of brain, unspecified: Secondary | ICD-10-CM | POA: Insufficient documentation

## 2023-03-24 MED ORDER — GADOBUTROL 1 MMOL/ML IV SOLN
8.0000 mL | Freq: Once | INTRAVENOUS | Status: AC | PRN
  Administered 2023-03-24: 8 mL via INTRAVENOUS

## 2023-03-24 NOTE — Progress Notes (Deleted)
Palliative Medicine [facility]  [phone] [phone]) [phone]   Name: [name] Date: 03/24/2023 MRN: [id number]  DOB: 04-Sep-1965  Patient Care Team: Shirline Frees, NP as PCP - General (Family Medicine) Trilby Leaver, MD as Referring Physician (Gastroenterology) Malachy Mood, MD as Consulting Physician (Oncology) Barbaraann Cao Georgeanna Lea, MD as Consulting Physician (Oncology)   INTERVAL HISTORY: KITANA GILANI is a 57 y.o. female with medical history of glioblastoma s/p resection (11/2020) IMRT, currently on dexamethasone and Temodar, pancreatic lesion, hypertension, and hyperlipidemia.  Palliative ask to see for symptom management and goals of care.   SOCIAL HISTORY:    Ms. Gamino reports that she has never smoked. She has never used smokeless tobacco. She reports that she does not drink alcohol and does not use drugs.  ADVANCE DIRECTIVES:  None on file  CODE STATUS: Full code  PAST MEDICAL HISTORY: Past Medical History:  Diagnosis Date   Bell's palsy    left side of face- notied in smile and eyes, if very tired   Brain cancer ([name])    Complication of anesthesia    N/V   Essential hypertension    External hemorrhoids    History of blood transfusion    post op Hemorroids   History of pneumonia    Hyperlipidemia    Hyperthyroidism    hx 57 years old- oral meds   Iron deficiency anemia    Migraine headache    Pneumonia    x 2 - years ago   PONV (postoperative nausea and vomiting)    PPD positive    Pulmonary nodules    not seen on plain cxr, first detected 1/07 re ct 10/08 pos IPPD > 15 mm 12/09 FOB/lavage 04/20/08 neg afb smear   PVC's (premature ventricular contractions)    Seizure ([name]) 11/15/2020   Sleep apnea    refused CPAP- mouth piece recommended   Tuberculosis    in her late 30's    ALLERGIES:  has No Known Allergies.  MEDICATIONS:  Current Outpatient Medications  Medication Sig Dispense Refill   amLODipine  (NORVASC) 10 MG tablet TAKE 1 TABLET BY MOUTH EVERY DAY 90 tablet 1   HYDROcodone bit-homatropine (HYCODAN) 5-1.5 MG/5ML syrup Take 5 mLs by mouth every 8 (eight) hours as needed for cough. 120 mL 0   Lacosamide 100 MG TABS TAKE 1 TABLET BY MOUTH TWICE A DAY 60 tablet 2   levETIRAcetam (KEPPRA) 750 MG tablet TAKE 2 TABLETS (1,500 MG TOTAL) BY MOUTH 2 (TWO) TIMES DAILY. 360 tablet 1   levothyroxine (SYNTHROID) 100 MCG tablet Take 1 tablet (100 mcg total) by mouth daily. 90 tablet 3   LORazepam (ATIVAN) 2 MG tablet Take 1 tablet (2 mg total) by mouth every 8 (eight) hours as needed for seizure. 12 tablet 0   losartan (COZAAR) 50 MG tablet Take 1 tablet (50 mg total) by mouth daily. 30 tablet 2   ondansetron (ZOFRAN-ODT) 4 MG disintegrating tablet Take 1 tablet (4 mg total) by mouth every 8 (eight) hours as needed for nausea or vomiting. 10 tablet 0   Potassium Chloride ER 20 MEQ TBCR Take 1 tablet (20 mEq total) by mouth daily. 90 tablet 3   prochlorperazine (COMPAZINE) 10 MG tablet Take 10 mg by mouth every 6 (six) hours as needed for nausea or vomiting.     No current facility-administered medications for this visit.    VITAL SIGNS: There were no vitals taken for this visit. There were  no vitals filed for this visit.  Estimated body mass index is 26.37 kg/m as calculated from the following:   Height as of 10/10/22: 5\' 7"  (1.702 m).   Weight as of 02/27/23: 168 lb 6 oz (76.4 kg).   PERFORMANCE STATUS (ECOG) : 1 - Symptomatic but completely ambulatory  Assessment NAD RRR Normal breathing pattern AAO x4, slow response   IMPRESSION:  I saw Ms. Kozuch during her infusion. No acute distress. Family present. States she is doing well overall. Denies nausea, vomiting, constipation or diarrhea. Occasional headaches. Is working but continues to emphasize by the Friday she is extremely fatigued. She works limited hours by this date.   Seizure Management: Well-managed on current regimen including  Ativan as needed and Keppra.  Goals of Care: Ms. Campton expresses the desire to live each day to its fullest. She is concerned that the new lesions on top of her head represent cancer spread. She expresses being frustrated with seizure episodes as well as a decreased ability to concentrate. Emotional support provided through active listening and quiet presence.   PLAN: No symptom management needs at this time Will continue to monitor closely and adjust regimen as needed. Follow-up in 4-6 weeks coordinated with upcoming oncology appointments.  Patient knows to contact the office if needed.  Patient expressed understanding and was in agreement with this plan. She also understands that She can call the clinic at any time with any questions, concerns, or complaints.   Visit consisted of counseling and education dealing with the complex and emotionally intense issues of symptom management and palliative care in the setting of serious and potentially life-threatening illness.  Willette Alma, AGPCNP-BC  Palliative Medicine Team/[facility]  *Please note that this is a verbal dictation therefore any spelling or grammatical errors are due to the "Dragon Medical One" system interpretation.

## 2023-03-27 ENCOUNTER — Inpatient Hospital Stay: Payer: BC Managed Care – PPO

## 2023-03-27 ENCOUNTER — Inpatient Hospital Stay: Payer: BC Managed Care – PPO | Attending: Internal Medicine | Admitting: Internal Medicine

## 2023-03-27 ENCOUNTER — Inpatient Hospital Stay: Payer: BC Managed Care – PPO | Admitting: Nurse Practitioner

## 2023-03-27 VITALS — BP 150/99 | HR 114 | Temp 97.5°F | Wt 165.0 lb

## 2023-03-27 DIAGNOSIS — C712 Malignant neoplasm of temporal lobe: Secondary | ICD-10-CM | POA: Diagnosis not present

## 2023-03-27 DIAGNOSIS — Z801 Family history of malignant neoplasm of trachea, bronchus and lung: Secondary | ICD-10-CM | POA: Diagnosis not present

## 2023-03-27 DIAGNOSIS — Z79899 Other long term (current) drug therapy: Secondary | ICD-10-CM | POA: Diagnosis not present

## 2023-03-27 DIAGNOSIS — R569 Unspecified convulsions: Secondary | ICD-10-CM | POA: Diagnosis not present

## 2023-03-27 DIAGNOSIS — Z800 Family history of malignant neoplasm of digestive organs: Secondary | ICD-10-CM | POA: Insufficient documentation

## 2023-03-27 DIAGNOSIS — R252 Cramp and spasm: Secondary | ICD-10-CM | POA: Insufficient documentation

## 2023-03-27 DIAGNOSIS — C719 Malignant neoplasm of brain, unspecified: Secondary | ICD-10-CM

## 2023-03-27 DIAGNOSIS — Z9221 Personal history of antineoplastic chemotherapy: Secondary | ICD-10-CM | POA: Diagnosis not present

## 2023-03-27 DIAGNOSIS — Z923 Personal history of irradiation: Secondary | ICD-10-CM | POA: Diagnosis not present

## 2023-03-27 LAB — CMP (CANCER CENTER ONLY)
ALT: 15 U/L (ref 0–44)
AST: 17 U/L (ref 15–41)
Albumin: 4.5 g/dL (ref 3.5–5.0)
Alkaline Phosphatase: 141 U/L — ABNORMAL HIGH (ref 38–126)
Anion gap: 8 (ref 5–15)
BUN: 11 mg/dL (ref 6–20)
CO2: 26 mmol/L (ref 22–32)
Calcium: 10.1 mg/dL (ref 8.9–10.3)
Chloride: 103 mmol/L (ref 98–111)
Creatinine: 1.05 mg/dL — ABNORMAL HIGH (ref 0.44–1.00)
GFR, Estimated: >60 mL/min (ref >60)
Glucose, Bld: 129 mg/dL — ABNORMAL HIGH (ref 70–99)
Potassium: 3.7 mmol/L (ref 3.5–5.1)
Sodium: 137 mmol/L (ref 135–145)
Total Bilirubin: 1.6 mg/dL — ABNORMAL HIGH (ref <1.2)
Total Protein: 8 g/dL (ref 6.5–8.1)

## 2023-03-27 LAB — CBC WITH DIFFERENTIAL (CANCER CENTER ONLY)
Abs Immature Granulocytes: 0.02 10*3/uL (ref 0.00–0.07)
Basophils Absolute: 0 10*3/uL (ref 0.0–0.1)
Basophils Relative: 0 %
Eosinophils Absolute: 0.1 10*3/uL (ref 0.0–0.5)
Eosinophils Relative: 1 %
HCT: 46.4 % — ABNORMAL HIGH (ref 36.0–46.0)
Hemoglobin: 15.4 g/dL — ABNORMAL HIGH (ref 12.0–15.0)
Immature Granulocytes: 0 %
Lymphocytes Relative: 16 %
Lymphs Abs: 0.8 10*3/uL (ref 0.7–4.0)
MCH: 30.6 pg (ref 26.0–34.0)
MCHC: 33.2 g/dL (ref 30.0–36.0)
MCV: 92.2 fL (ref 80.0–100.0)
Monocytes Absolute: 0.4 10*3/uL (ref 0.1–1.0)
Monocytes Relative: 8 %
Neutro Abs: 3.8 10*3/uL (ref 1.7–7.7)
Neutrophils Relative %: 75 %
Platelet Count: 141 10*3/uL — ABNORMAL LOW (ref 150–400)
RBC: 5.03 MIL/uL (ref 3.87–5.11)
RDW: 14.9 % (ref 11.5–15.5)
WBC Count: 5.1 10*3/uL (ref 4.0–10.5)
nRBC: 0 % (ref 0.0–0.2)

## 2023-03-27 LAB — TOTAL PROTEIN, URINE DIPSTICK: Protein, ur: 100 mg/dL — AB

## 2023-03-27 NOTE — Progress Notes (Signed)
[facility] [address]  [place], [place] [address] [phone]   Interval Evaluation  Date of Service: 03/27/23 Patient Name: [name] Patient MRN: [id number] Patient DOB: [birth year] Provider: Henreitta Leber, MD  Identifying Statement:  [name] is a 57 y.o. 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)  11/21/2020 Surgery   Left temporal craniotomy, resection with Dr. Maurice Small.  Path is glioblastoma, IDHwt   12/27/2020 - 02/07/2021 Radiation Therapy   IMRT and concurrent Temodar Basilio [place])   03/12/2021 - 08/26/2021 Chemotherapy   Completes 5 cycles of adjuvant 5-day Temodar   08/27/2021 Progression   POD #1   09/06/2021 -  Chemotherapy   Initiates second line therapy; CCNU 90mg /m2 PO q6 weeks and avastin 10mg /kg IV q2 weeks   12/28/2021 Progression   POD #2   01/17/2022 -  Chemotherapy   Third line therapy with Irinotecan and Avastin q2 weeks   11/21/2022 -  Chemotherapy   Transitions to single agent Avastin     Biomarkers:  MGMT Methylated.  IDH 1/2 Wild type.  EGFR Unknown  TERT Unknown   Interval History: [name] presents today following recent MRI brain.  Does have cold symptoms, but no new neurologic complaints today.  Expressive language continues to decline gradually.  Short term memory impairment and fatigue are still persistent.  She continues to work from home 4 hours per day without issue.    H+P (11/25/20) Patient presented to medical attention this past month with first ever seizure.  She describes episode of sudden onset "disconnection" with tongue biting and urination, followed by period of confusion.  CNS imaging demonstrated enhancing mass within left anterior temporal lobe, which was mostly resected by Dr. Maurice Small on 11/21/20.  Since surgery she had not had recurrence of seizures.  She  complains of painful cramping of her right leg, which started ~1 week prior.  At this time the cramping is her main complaint.  Otherwise fully functional, independent, would like to return to work if possible.  Medications: Current Outpatient Medications on File Prior to Visit  Medication Sig Dispense Refill   amLODipine (NORVASC) 10 MG tablet TAKE 1 TABLET BY MOUTH EVERY DAY 90 tablet 1   HYDROcodone bit-homatropine (HYCODAN) 5-1.5 MG/5ML syrup Take 5 mLs by mouth every 8 (eight) hours as needed for cough. 120 mL 0   Lacosamide 100 MG TABS TAKE 1 TABLET BY MOUTH TWICE A DAY 60 tablet 3   levETIRAcetam (KEPPRA) 750 MG tablet TAKE 2 TABLETS (1,500 MG TOTAL) BY MOUTH 2 (TWO) TIMES DAILY. 360 tablet 1   levothyroxine (SYNTHROID) 100 MCG tablet Take 1 tablet (100 mcg total) by mouth daily. 90 tablet 3   LORazepam (ATIVAN) 2 MG tablet Take 1 tablet (2 mg total) by mouth every 8 (eight) hours as needed for seizure. 12 tablet 0   ondansetron (ZOFRAN-ODT) 4 MG disintegrating tablet Take 1 tablet (4 mg total) by mouth every 8 (eight) hours as needed for nausea or vomiting. 10 tablet 0   Potassium Chloride ER 20 MEQ TBCR Take 1 tablet (20 mEq total) by mouth daily. 90 tablet 3   prochlorperazine (COMPAZINE) 10 MG tablet Take 10 mg by mouth every 6 (six) hours as needed for nausea or vomiting.     losartan (COZAAR) 50 MG tablet Take 1 tablet (50 mg total) by mouth daily. (Patient not taking: Reported on 03/27/2023)  30 tablet 2   No current facility-administered medications on file prior to visit.    Allergies: No Known Allergies Past Medical History:  Past Medical History:  Diagnosis Date   Bell's palsy    left side of face- notied in smile and eyes, if very tired   Brain cancer ([name])    Complication of anesthesia    N/V   Essential hypertension    External hemorrhoids    History of blood transfusion    post op Hemorroids   History of pneumonia    Hyperlipidemia    Hyperthyroidism    hx 57  years old- oral meds   Iron deficiency anemia    Migraine headache    Pneumonia    x 2 - years ago   PONV (postoperative nausea and vomiting)    PPD positive    Pulmonary nodules    not seen on plain cxr, first detected 1/07 re ct 10/08 pos IPPD > 15 mm 12/09 FOB/lavage 04/20/08 neg afb smear   PVC's (premature ventricular contractions)    Seizure ([name]) 11/15/2020   Sleep apnea    refused CPAP- mouth piece recommended   Tuberculosis    in her late 30's   Past Surgical History:  Past Surgical History:  Procedure Laterality Date   ABDOMINAL HYSTERECTOMY     APPLICATION OF CRANIAL NAVIGATION N/A 11/21/2020   Procedure: APPLICATION OF CRANIAL NAVIGATION;  Surgeon: Jadene Pierini, MD;  Location: [facility] OR;  Service: Neurosurgery;  Laterality: N/A;   BREAST BIOPSY Right 2014   BREAST BIOPSY Left 10/29/2019   CHOLECYSTECTOMY  2014   COLONOSCOPY  05/04/2009   prolapsing external hemorrhoids   CRANIOTOMY Left 11/21/2020   Procedure: Left craniotomy for tumor resection;  Surgeon: Jadene Pierini, MD;  Location: [facility] OR;  Service: Neurosurgery;  Laterality: Left;   ENDOMETRIAL ABLATION     HEMORRHOID SURGERY     x 3   TUBAL LIGATION     Social History:  Social History   Socioeconomic History   Marital status: Divorced    Spouse name: Not on file   Number of children: 2   Years of education: Not on file   Highest education level: Associate degree: occupational, Scientist, product/process development, or vocational program  Occupational History   Occupation: accounts IT trainer: [employer]  Tobacco Use   Smoking status: Never   Smokeless tobacco: Never  Vaping Use   Vaping status: Never Used  Substance and Sexual Activity   Alcohol use: No    Alcohol/week: 0.0 standard drinks of alcohol   Drug use: No   Sexual activity: Not on file  Other Topics Concern   Not on file  Social History Narrative   Divorced with 2 children   She works as a Merchandiser, retail in Audiological [employer]    Possible TB exposure  2008 (after nodules found)   From [place] lived there and [place]   Never owned birds      Social Determinants of Health   Financial Resource Strain: Medium Risk (06/13/2021)   Overall Financial Resource Strain (CARDIA)    Difficulty of Paying Living Expenses: Somewhat hard  Food Insecurity: Food Insecurity Present (04/23/2021)   Hunger Vital Sign    Worried About Running Out of Food in the Last Year: Sometimes true    Ran Out of Food in the Last Year: Never true  Transportation Needs: No Transportation Needs (06/13/2021)   PRAPARE - Administrator, Civil Service (Medical): No  Lack of Transportation (Non-Medical): No  Physical Activity: Unknown (04/23/2021)   Exercise Vital Sign    Days of Exercise per Week: 0 days    Minutes of Exercise per Session: Not on file  Stress: No Stress Concern Present (04/23/2021)   [employer] of Occupational Health - Occupational Stress Questionnaire    Feeling of Stress : Only a little  Social Connections: Unknown (09/16/2021)   Received from [facility], [facility]   Social Network    Social Network: Not on file  Intimate Partner Violence: Unknown (08/08/2021)   [facility] from [facility], [facility]   HITS    Physically Hurt: Not on file    Insult or Talk Down To: Not on file    Threaten Physical Harm: Not on file    Scream or Curse: Not on file   Family History:  Family History  Problem Relation Age of Onset   Diabetes type II Mother    Kidney failure Sister    Diabetes Mellitus II Sister    Colon cancer Maternal Aunt        aunts and uncles   Diabetes Mellitus II Maternal Aunt    Pancreatic cancer Maternal Aunt    Colon cancer Maternal Uncle    Diabetes Mellitus II Maternal Uncle    Heart disease Maternal Grandmother        aunts, uncles, sister   Diabetes Mellitus II Maternal Grandmother    Lung cancer Other        aunts and uncles    Review of Systems: Constitutional: poor appetite Eyes: blurriness of  vision Ears, nose, mouth, throat, and face: Doesn't report sore throat Respiratory: Doesn't report cough, dyspnea or wheezes Cardiovascular: Doesn't report palpitation, chest discomfort  Gastrointestinal:  Doesn't report nausea, constipation, diarrhea GU: Doesn't report incontinence Skin: Doesn't report skin rashes Neurological: Per HPI Musculoskeletal: bilateral shoulder pain Behavioral/Psych: +anxiety  Physical Exam: Vitals:   03/27/23 1246  BP: (!) 150/99  Pulse: (!) 114  Temp: (!) 97.5 F (36.4 C)  SpO2: 98%     KPS: 70. General: Alert, cooperative, pleasant, in no acute distress Head: Normal EENT: No conjunctival injection or scleral icterus.  Lungs: Resp effort normal Cardiac: Regular rate Abdomen: Non-distended abdomen Skin: No rashes cyanosis or petechiae. Extremities: No clubbing or edema  Neurologic Exam: Mental Status: Awake, alert, attentive to examiner. Oriented to self and environment. Language is notable for modest impairment in fluency.  Impaired short term recall. Cranial Nerves: Visual acuity is grossly normal. Visual fields are full. Extra-ocular movements intact. No ptosis. Face L LMN paresis, chronic. Motor: Tone and bulk are normal. Power is full in both arms and legs. Reflexes are symmetric, no pathologic reflexes present.  Sensory: Intact to light touch Gait: Normal.   Labs: I have reviewed the data as listed    Component Value Date/Time   NA 138 02/27/2023 0829   K 3.6 02/27/2023 0829   CL 104 02/27/2023 0829   CO2 26 02/27/2023 0829   GLUCOSE 88 02/27/2023 0829   BUN 15 02/27/2023 0829   CREATININE 1.04 (H) 02/27/2023 0829   CREATININE 0.92 01/25/2020 1424   CALCIUM 10.1 02/27/2023 0829   PROT 7.6 02/27/2023 0829   ALBUMIN 4.5 02/27/2023 0829   AST 17 02/27/2023 0829   ALT 19 02/27/2023 0829   ALKPHOS 130 (H) 02/27/2023 0829   BILITOT 1.0 02/27/2023 0829   GFRNONAA >60 02/27/2023 0829   GFRNONAA 72 01/25/2020 1424   GFRAA 83  01/25/2020 1424  Lab Results  Component Value Date   WBC 5.1 03/27/2023   NEUTROABS 3.8 03/27/2023   HGB 15.4 (H) 03/27/2023   HCT 46.4 (H) 03/27/2023   MCV 92.2 03/27/2023   PLT 141 (L) 03/27/2023   Imaging:  [facility] Clinician Interpretation: I have personally reviewed the CNS images as listed.  My interpretation, in the context of the patient's clinical presentation, is stable disease  MR BRAIN W WO CONTRAST  Result Date: 03/24/2023 CLINICAL DATA:  Brain/CNS neoplasm, assess treatment response. CNS WHO grade 4 glioma, IDH wild-type. EXAM: MRI HEAD WITHOUT AND WITH CONTRAST TECHNIQUE: Multiplanar, multiecho pulse sequences of the brain and surrounding structures were obtained without and with intravenous contrast. CONTRAST:  8mL GADAVIST GADOBUTROL 1 MMOL/ML IV SOLN COMPARISON:  MRI brain 01/09/2023. FINDINGS: Brain: Stable postoperative changes from prior left temporal craniotomy with underlying resection cavity in the left anterior temporal lobe. Unchanged surrounding T2 hyperintense signal extending along the mesial left temporal lobe and left insula. Unchanged foci of decreased diffusivity along the medial left periatrial white matter with associated faint contrast enhancement on coronal postcontrast images (coronal image 9 series 17). No new or nodular contrast enhancement. No acute infarct or hemorrhage. No hydrocephalus, extra-axial collection or midline shift. Vascular: Normal flow voids and vessel enhancement. Skull and upper cervical spine: Prior left temporal craniotomy. Sinuses/Orbits: Unremarkable. Other: None. IMPRESSION: 1. Stable postoperative changes from prior left temporal craniotomy with underlying resection cavity in the left anterior temporal lobe. Unchanged surrounding T2 hyperintense signal extending along the mesial left temporal lobe and left insula. 2. Unchanged foci of decreased diffusivity along the medial left periatrial white matter with associated faint contrast  enhancement on coronal postcontrast images. No new or nodular contrast enhancement. Electronically Signed   By: Orvan Falconer M.D.   On: 03/24/2023 10:54     Assessment/Plan 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)  Focal seizures ([name])  [name] is clinically stable today.  MRI brain demonstrates stable findings.  Given stability on imaging, long term effects of chemotherapy, chronic cytopenias, patient tolerance and preference, will transition to observation/surveillance.  She is agreeable with this.  For seizures, will con't Vimpat 100/100.  Keppra will also continue 1000mg  BID.  We ask that CAPRISHA HOLLSTEIN return to clinic in 2 months with MRI brain for review.  All questions were answered. The patient knows to call the clinic with any problems, questions or concerns. No barriers to learning were detected.  The total time spent in the encounter was 30 minutes and more than 50% was on counseling and review of test results   Henreitta Leber, MD Medical Director of Neuro-Oncology [facility] 03/27/23 1:04 PM

## 2023-04-01 ENCOUNTER — Telehealth: Payer: Self-pay | Admitting: Internal Medicine

## 2023-04-04 ENCOUNTER — Other Ambulatory Visit: Payer: Self-pay

## 2023-04-24 ENCOUNTER — Ambulatory Visit: Payer: BC Managed Care – PPO

## 2023-04-24 ENCOUNTER — Ambulatory Visit: Payer: BC Managed Care – PPO | Admitting: Internal Medicine

## 2023-04-24 ENCOUNTER — Other Ambulatory Visit: Payer: BC Managed Care – PPO

## 2023-05-17 ENCOUNTER — Other Ambulatory Visit: Payer: Self-pay | Admitting: Internal Medicine

## 2023-05-17 DIAGNOSIS — I10 Essential (primary) hypertension: Secondary | ICD-10-CM

## 2023-05-19 ENCOUNTER — Encounter: Payer: Self-pay | Admitting: Internal Medicine

## 2023-05-20 ENCOUNTER — Other Ambulatory Visit: Payer: Self-pay | Admitting: Adult Health

## 2023-05-20 DIAGNOSIS — Z1231 Encounter for screening mammogram for malignant neoplasm of breast: Secondary | ICD-10-CM

## 2023-05-21 ENCOUNTER — Ambulatory Visit (HOSPITAL_COMMUNITY)
Admission: RE | Admit: 2023-05-21 | Discharge: 2023-05-21 | Disposition: A | Discharge status: Home / Self Care | Payer: BC Managed Care – PPO | Source: Ambulatory Visit | Attending: Internal Medicine | Admitting: Internal Medicine

## 2023-05-21 ENCOUNTER — Ambulatory Visit
Admission: RE | Admit: 2023-05-21 | Discharge: 2023-05-21 | Discharge status: Home / Self Care | Payer: BC Managed Care – PPO | Source: Ambulatory Visit | Attending: Adult Health

## 2023-05-21 DIAGNOSIS — R519 Headache, unspecified: Secondary | ICD-10-CM | POA: Diagnosis not present

## 2023-05-21 DIAGNOSIS — C719 Malignant neoplasm of brain, unspecified: Secondary | ICD-10-CM | POA: Diagnosis not present

## 2023-05-21 DIAGNOSIS — Z1231 Encounter for screening mammogram for malignant neoplasm of breast: Secondary | ICD-10-CM

## 2023-05-21 MED ORDER — GADOPICLENOL 0.5 MMOL/ML IV SOLN
7.0000 mL | Freq: Once | INTRAVENOUS | Status: AC | PRN
  Administered 2023-05-21: 7 mL via INTRAVENOUS

## 2023-05-25 ENCOUNTER — Encounter: Payer: Self-pay | Admitting: Internal Medicine

## 2023-05-26 ENCOUNTER — Inpatient Hospital Stay: Payer: BC Managed Care – PPO | Attending: Internal Medicine | Admitting: Internal Medicine

## 2023-05-26 VITALS — BP 148/81 | HR 94 | Temp 97.7°F | Resp 17 | Ht 67.0 in | Wt 166.6 lb

## 2023-05-26 DIAGNOSIS — C712 Malignant neoplasm of temporal lobe: Secondary | ICD-10-CM | POA: Insufficient documentation

## 2023-05-26 DIAGNOSIS — R569 Unspecified convulsions: Secondary | ICD-10-CM

## 2023-05-26 DIAGNOSIS — Z800 Family history of malignant neoplasm of digestive organs: Secondary | ICD-10-CM | POA: Insufficient documentation

## 2023-05-26 DIAGNOSIS — Z801 Family history of malignant neoplasm of trachea, bronchus and lung: Secondary | ICD-10-CM | POA: Diagnosis not present

## 2023-05-26 DIAGNOSIS — Z79899 Other long term (current) drug therapy: Secondary | ICD-10-CM | POA: Insufficient documentation

## 2023-05-26 DIAGNOSIS — C719 Malignant neoplasm of brain, unspecified: Secondary | ICD-10-CM

## 2023-05-26 MED ORDER — DEXAMETHASONE 2 MG PO TABS: 2.0000 mg | ORAL_TABLET | Freq: Every day | ORAL | 1 refills | Status: DC

## 2023-05-26 NOTE — Progress Notes (Signed)
[facility] [address]  [place], [place] [address] [phone]   Interval Evaluation  Date of Service: 05/26/23 Patient Name: [name] Patient MRN: [id number] Patient DOB: [birth year] Provider: Henreitta Leber, MD  Identifying Statement:  [name] is a 58 y.o. 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)  11/21/2020 Surgery   Left temporal craniotomy, resection with Dr. Maurice Small.  Path is glioblastoma, IDHwt   12/27/2020 - 02/07/2021 Radiation Therapy   IMRT and concurrent Temodar Basilio [place])   03/12/2021 - 08/26/2021 Chemotherapy   Completes 5 cycles of adjuvant 5-day Temodar   08/27/2021 Progression   POD #1   09/06/2021 -  Chemotherapy   Initiates second line therapy; CCNU 90mg /m2 PO q6 weeks and avastin 10mg /kg IV q2 weeks   12/28/2021 Progression   POD #2   01/17/2022 -  Chemotherapy   Third line therapy with Irinotecan and Avastin q2 weeks   11/21/2022 -  Chemotherapy   Transitions to single agent Avastin     Biomarkers:  MGMT Methylated.  IDH 1/2 Wild type.  EGFR Unknown  TERT Unknown   Interval History: [name] presents today following recent MRI brain.  She and family describe ongoing decline in expressive langauge.  She is struggling to complete sentences compared to prior.  Short term memory impairment and fatigue are still persistent.  Balance is a little worse. She continues to work from home part of the day, she is having increased difficulty with this.    H+P (11/25/20) Patient presented to medical attention this past month with first ever seizure.  She describes episode of sudden onset "disconnection" with tongue biting and urination, followed by period of confusion.  CNS imaging demonstrated enhancing mass within left anterior temporal lobe, which was mostly resected by Dr. Maurice Small on 11/21/20.   Since surgery she had not had recurrence of seizures.  She complains of painful cramping of her right leg, which started ~1 week prior.  At this time the cramping is her main complaint.  Otherwise fully functional, independent, would like to return to work if possible.  Medications: Current Outpatient Medications on File Prior to Visit  Medication Sig Dispense Refill   amLODipine (NORVASC) 10 MG tablet TAKE 1 TABLET BY MOUTH EVERY DAY 90 tablet 1   HYDROcodone bit-homatropine (HYCODAN) 5-1.5 MG/5ML syrup Take 5 mLs by mouth every 8 (eight) hours as needed for cough. 120 mL 0   Lacosamide 100 MG TABS TAKE 1 TABLET BY MOUTH TWICE A DAY 60 tablet 3   levETIRAcetam (KEPPRA) 750 MG tablet TAKE 2 TABLETS (1,500 MG TOTAL) BY MOUTH 2 (TWO) TIMES DAILY. 360 tablet 1   levothyroxine (SYNTHROID) 100 MCG tablet Take 1 tablet (100 mcg total) by mouth daily. 90 tablet 3   LORazepam (ATIVAN) 2 MG tablet Take 1 tablet (2 mg total) by mouth every 8 (eight) hours as needed for seizure. 12 tablet 0   losartan (COZAAR) 50 MG tablet TAKE 1 TABLET BY MOUTH EVERY DAY 90 tablet 1   ondansetron (ZOFRAN-ODT) 4 MG disintegrating tablet Take 1 tablet (4 mg total) by mouth every 8 (eight) hours as needed for nausea or vomiting. 10 tablet 0   Potassium Chloride ER 20 MEQ TBCR Take 1 tablet (20 mEq total) by mouth daily. 90 tablet 3   prochlorperazine (COMPAZINE) 10 MG tablet Take 10 mg by mouth every 6 (six) hours as  needed for nausea or vomiting.     No current facility-administered medications on file prior to visit.    Allergies: No Known Allergies Past Medical History:  Past Medical History:  Diagnosis Date   Bell's palsy    left side of face- notied in smile and eyes, if very tired   Brain cancer ([name])    Complication of anesthesia    N/V   Essential hypertension    External hemorrhoids    History of blood transfusion    post op Hemorroids   History of pneumonia    Hyperlipidemia    Hyperthyroidism    hx  58 years old- oral meds   Iron deficiency anemia    Migraine headache    Pneumonia    x 2 - years ago   PONV (postoperative nausea and vomiting)    PPD positive    Pulmonary nodules    not seen on plain cxr, first detected 1/07 re ct 10/08 pos IPPD > 15 mm 12/09 FOB/lavage 04/20/08 neg afb smear   PVC's (premature ventricular contractions)    Seizure ([name]) 11/15/2020   Sleep apnea    refused CPAP- mouth piece recommended   Tuberculosis    in her late 30's   Past Surgical History:  Past Surgical History:  Procedure Laterality Date   ABDOMINAL HYSTERECTOMY     APPLICATION OF CRANIAL NAVIGATION N/A 11/21/2020   Procedure: APPLICATION OF CRANIAL NAVIGATION;  Surgeon: Jadene Pierini, MD;  Location: [facility] OR;  Service: Neurosurgery;  Laterality: N/A;   BREAST BIOPSY Right 2014   BREAST BIOPSY Left 10/29/2019   CHOLECYSTECTOMY  2014   COLONOSCOPY  05/04/2009   prolapsing external hemorrhoids   CRANIOTOMY Left 11/21/2020   Procedure: Left craniotomy for tumor resection;  Surgeon: Jadene Pierini, MD;  Location: [facility] OR;  Service: Neurosurgery;  Laterality: Left;   ENDOMETRIAL ABLATION     HEMORRHOID SURGERY     x 3   TUBAL LIGATION     Social History:  Social History   Socioeconomic History   Marital status: Divorced    Spouse name: Not on file   Number of children: 2   Years of education: Not on file   Highest education level: Associate degree: occupational, Scientist, product/process development, or vocational program  Occupational History   Occupation: accounts IT trainer: [employer]  Tobacco Use   Smoking status: Never   Smokeless tobacco: Never  Vaping Use   Vaping status: Never Used  Substance and Sexual Activity   Alcohol use: No    Alcohol/week: 0.0 standard drinks of alcohol   Drug use: No   Sexual activity: Not on file  Other Topics Concern   Not on file  Social History Narrative   Divorced with 2 children   She works as a Merchandiser, retail in Audiological scientist    Possible TB exposure  2008 (after nodules found)   From [place] lived there and [place]   Never owned birds      Social Drivers of Health   Financial Resource Strain: Medium Risk (06/13/2021)   Overall Financial Resource Strain (CARDIA)    Difficulty of Paying Living Expenses: Somewhat hard  Food Insecurity: Food Insecurity Present (04/23/2021)   Hunger Vital Sign    Worried About Running Out of Food in the Last Year: Sometimes true    Ran Out of Food in the Last Year: Never true  Transportation Needs: No Transportation Needs (06/13/2021)   PRAPARE - Transportation    Lack of  Transportation (Medical): No    Lack of Transportation (Non-Medical): No  Physical Activity: Unknown (04/23/2021)   Exercise Vital Sign    Days of Exercise per Week: 0 days    Minutes of Exercise per Session: Not on file  Stress: No Stress Concern Present (04/23/2021)   [employer] of Occupational Health - Occupational Stress Questionnaire    Feeling of Stress : Only a little  Social Connections: Unknown (09/16/2021)   [facility] from [facility]   Social Network    Social Network: Not on file  Intimate Partner Violence: Unknown (08/08/2021)   Received from [facility], [facility]   HITS    Physically Hurt: Not on file    Insult or Talk Down To: Not on file    Threaten Physical Harm: Not on file    Scream or Curse: Not on file   Family History:  Family History  Problem Relation Age of Onset   Diabetes type II Mother    Kidney failure Sister    Diabetes Mellitus II Sister    Colon cancer Maternal Aunt        aunts and uncles   Diabetes Mellitus II Maternal Aunt    Pancreatic cancer Maternal Aunt    Colon cancer Maternal Uncle    Diabetes Mellitus II Maternal Uncle    Heart disease Maternal Grandmother        aunts, uncles, sister   Diabetes Mellitus II Maternal Grandmother    Lung cancer Other        aunts and uncles    Review of Systems: Constitutional: poor appetite Eyes: blurriness of vision Ears,  nose, mouth, throat, and face: Doesn't report sore throat Respiratory: Doesn't report cough, dyspnea or wheezes Cardiovascular: Doesn't report palpitation, chest discomfort  Gastrointestinal:  Doesn't report nausea, constipation, diarrhea GU: Doesn't report incontinence Skin: Doesn't report skin rashes Neurological: Per HPI Musculoskeletal: bilateral shoulder pain Behavioral/Psych: +anxiety  Physical Exam: Vitals:   05/26/23 1225 05/26/23 1230  BP: (!) 162/91 (!) 148/81  Pulse: 94   Resp: 17   Temp: 97.7 F (36.5 C)   SpO2: 100%      KPS: 70. General: Alert, cooperative, pleasant, in no acute distress Head: Normal EENT: No conjunctival injection or scleral icterus.  Lungs: Resp effort normal Cardiac: Regular rate Abdomen: Non-distended abdomen Skin: No rashes cyanosis or petechiae. Extremities: No clubbing or edema  Neurologic Exam: Mental Status: Awake, alert, attentive to examiner. Oriented to self and environment. Language is notable for modest impairment in fluency.  Impaired short term recall. Cranial Nerves: Visual acuity is grossly normal. Visual fields are full. Extra-ocular movements intact. No ptosis. Face L LMN paresis, chronic. Motor: Tone and bulk are normal. Power is full in both arms and legs. Reflexes are symmetric, no pathologic reflexes present.  Sensory: Intact to light touch Gait: Dystaxic  Labs: I have reviewed the data as listed    Component Value Date/Time   NA 137 03/27/2023 1211   K 3.7 03/27/2023 1211   CL 103 03/27/2023 1211   CO2 26 03/27/2023 1211   GLUCOSE 129 (H) 03/27/2023 1211   BUN 11 03/27/2023 1211   CREATININE 1.05 (H) 03/27/2023 1211   CREATININE 0.92 01/25/2020 1424   CALCIUM 10.1 03/27/2023 1211   PROT 8.0 03/27/2023 1211   ALBUMIN 4.5 03/27/2023 1211   AST 17 03/27/2023 1211   ALT 15 03/27/2023 1211   ALKPHOS 141 (H) 03/27/2023 1211   BILITOT 1.6 (H) 03/27/2023 1211   GFRNONAA >  60 03/27/2023 1211   GFRNONAA 72  01/25/2020 1424   GFRAA 83 01/25/2020 1424   Lab Results  Component Value Date   WBC 5.1 03/27/2023   NEUTROABS 3.8 03/27/2023   HGB 15.4 (H) 03/27/2023   HCT 46.4 (H) 03/27/2023   MCV 92.2 03/27/2023   PLT 141 (L) 03/27/2023   Imaging:  [facility] Clinician Interpretation: I have personally reviewed the CNS images as listed.  My interpretation, in the context of the patient's clinical presentation, is treatment effect vs true progression  MM 3D SCREENING MAMMOGRAM BILATERAL BREAST Result Date: 05/22/2023 CLINICAL DATA:  Screening. EXAM: DIGITAL SCREENING BILATERAL MAMMOGRAM WITH TOMOSYNTHESIS AND CAD TECHNIQUE: Bilateral screening digital craniocaudal and mediolateral oblique mammograms were obtained. Bilateral screening digital breast tomosynthesis was performed. The images were evaluated with computer-aided detection. COMPARISON:  Previous exam(s). ACR Breast Density Category c: The breasts are heterogeneously dense, which may obscure small masses. FINDINGS: There are no findings suspicious for malignancy. IMPRESSION: No mammographic evidence of malignancy. A result letter of this screening mammogram will be mailed directly to the patient. RECOMMENDATION: Screening mammogram in one year. (Code:SM-B-01Y) BI-RADS CATEGORY  1: Negative. Electronically Signed   By: Elberta Fortis M.D.   On: 05/22/2023 16:24   MR BRAIN W WO CONTRAST Result Date: 05/21/2023 CLINICAL DATA:  Glioblastoma, assess treatment response, facial pain for 1 month EXAM: MRI HEAD WITHOUT AND WITH CONTRAST TECHNIQUE: Multiplanar, multiecho pulse sequences of the brain and surrounding structures were obtained without and with intravenous contrast. CONTRAST:  7 mL Gadavist COMPARISON:  03/24/2023 FINDINGS: Brain: New area of contrast enhancement in the anterior inferior left frontal lobe, which measures up to 1.9 x 0.6 x 1.3 cm (AP x TR x CC) (series 16, image 85 and series 18, image 14). In retrospect, there was faint enhancement in  this region on the prior exam. Enhancement extends to the left frontal horn (series 16, image 98). Status post left temporal craniotomy with subjacent resection cavity in the left anterior temporal lobe. Redemonstrated diffusion restriction in the left periatrial white matter (series 5, image 25 and 35), which is associated with contrast enhancement that is better seen on the coronal images. Enhancement in the left medial periventricular white matter (series 19, image 12) appears similar to the prior exam. Nodular enhancement along the atrium and left temporal horn of the left lateral ventricle appears slightly increased posteriorly (series 19, image 12), which may correlate with slightly increased diffusion restricting signal (series 5, image 27). An additional area of diffusion restriction is noted along the medial aspect of the atrium (series 5, image 27), although this does not definitively correlate with contrast enhancement and appears similar to the prior exam. Overall unchanged associated T2 hyperintense signal. No evidence of acute infarct, hemorrhage, mass effect, or midline shift. No hydrocephalus or extra-axial collection. Vascular: Normal arterial flow voids. Skull and upper cervical spine: Prior left temporal craniotomy. Otherwise normal marrow signal. Sinuses/Orbits: Mucosal thickening in the ethmoid air cells. No acute finding in the orbits. IMPRESSION: 1. New area of enhancement in the anterior inferior left frontal lobe, adjacent to the left frontal horn, concerning for disease progression. 2. Slightly increased nodular enhancement along the atrium in left temporal horn, also concerning for disease progression. Electronically Signed   By: Wiliam Ke M.D.   On: 05/21/2023 19:06     Assessment/Plan 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)  Focal seizures ([name])  Aleiah Judie Petit Morejon is clinically progressive today, with worsening expressive language deficits and balance.  MRI brain demonstrates progression of disease, with increase in volume of enhancement within mesial left frontal lobe.  Etiology is unclear, would favor organic progression of disease, but avastin washout effect should also be considered.  Prior study was 2-3 weeks after final avastin infusion in late October.  Presence of worsening language deficits, on the other hand, pushes [place] towards neoplastic progression.  We provided three options moving forward, reviewing details extensively: -Return to irinotecan+avastin infusions which had been effective previously; last administered June 2024. -Re-irradiation 35/10x given long duration from prior RT and distance from initial high dose field.  This would be given with concurrent avastin. -Repeating MRI brain in 1 month to rule out avastin washout MRI artifact prior to initiating salvage therapy  Did recommend resuming decadron 2mg  daily in the interim.  For seizures, will con't Vimpat 100/100.  Keppra will also continue 1000mg  BID.  We ask that LAWRENCIA DEATS return to clinic following goals of care decision.  All questions were answered. The patient knows to call the clinic with any problems, questions or concerns. No barriers to learning were detected.  The total time spent in the encounter was 40 minutes and more than 50% was on counseling and review of test results   Henreitta Leber, MD Medical Director of Neuro-Oncology [facility] 05/26/23 2:17 PM

## 2023-05-27 ENCOUNTER — Encounter: Payer: Self-pay | Admitting: Internal Medicine

## 2023-05-27 NOTE — Progress Notes (Signed)
Palliative Medicine [facility]  [phone] [phone]   Name: [name] Date: 05/27/2023 MRN: [id number]  DOB: 1965-12-01  Patient Care Team: Shirline Frees, NP as PCP - General (Family Medicine) Trilby Leaver, MD as Referring Physician (Gastroenterology) Malachy Mood, MD as Consulting Physician (Oncology) Barbaraann Cao Georgeanna Lea, MD as Consulting Physician (Oncology)   INTERVAL HISTORY: [name] is a 58 y.o. female with medical history of glioblastoma s/p resection (11/2020) IMRT, currently on dexamethasone and Temodar, pancreatic lesion, hypertension, and hyperlipidemia.  Palliative ask to see for symptom management and goals of care.   SOCIAL HISTORY:    [name] reports that she has never smoked. She has never used smokeless tobacco. She reports that she does not drink alcohol and does not use drugs.  ADVANCE DIRECTIVES:  None on file  CODE STATUS: Full code  PAST MEDICAL HISTORY: Past Medical History:  Diagnosis Date   Bell's palsy    left side of face- notied in smile and eyes, if very tired   Brain cancer ([name])    Complication of anesthesia    N/V   Essential hypertension    External hemorrhoids    History of blood transfusion    post op Hemorroids   History of pneumonia    Hyperlipidemia    Hyperthyroidism    hx 58 years old- oral meds   Iron deficiency anemia    Migraine headache    Pneumonia    x 2 - years ago   PONV (postoperative nausea and vomiting)    PPD positive    Pulmonary nodules    not seen on plain cxr, first detected 1/07 re ct 10/08 pos IPPD > 15 mm 12/09 FOB/lavage 04/20/08 neg afb smear   PVC's (premature ventricular contractions)    Seizure ([name]) 11/15/2020   Sleep apnea    refused CPAP- mouth piece recommended   Tuberculosis    in her late 30's    ALLERGIES:  has no known allergies.  MEDICATIONS:  Current Outpatient Medications  Medication Sig Dispense Refill   amLODipine  (NORVASC) 10 MG tablet TAKE 1 TABLET BY MOUTH EVERY DAY 90 tablet 1   dexamethasone (DECADRON) 2 MG tablet Take 1 tablet (2 mg total) by mouth daily. 30 tablet 1   HYDROcodone bit-homatropine (HYCODAN) 5-1.5 MG/5ML syrup Take 5 mLs by mouth every 8 (eight) hours as needed for cough. 120 mL 0   Lacosamide 100 MG TABS TAKE 1 TABLET BY MOUTH TWICE A DAY 60 tablet 3   levETIRAcetam (KEPPRA) 750 MG tablet TAKE 2 TABLETS (1,500 MG TOTAL) BY MOUTH 2 (TWO) TIMES DAILY. 360 tablet 1   levothyroxine (SYNTHROID) 100 MCG tablet Take 1 tablet (100 mcg total) by mouth daily. 90 tablet 3   LORazepam (ATIVAN) 2 MG tablet Take 1 tablet (2 mg total) by mouth every 8 (eight) hours as needed for seizure. 12 tablet 0   losartan (COZAAR) 50 MG tablet TAKE 1 TABLET BY MOUTH EVERY DAY 90 tablet 1   ondansetron (ZOFRAN-ODT) 4 MG disintegrating tablet Take 1 tablet (4 mg total) by mouth every 8 (eight) hours as needed for nausea or vomiting. 10 tablet 0   Potassium Chloride ER 20 MEQ TBCR Take 1 tablet (20 mEq total) by mouth daily. 90 tablet 3   prochlorperazine (COMPAZINE) 10 MG tablet Take 10 mg by mouth every 6 (six) hours as needed for nausea or vomiting.     No current facility-administered medications for  this visit.    VITAL SIGNS: There were no vitals taken for this visit. There were no vitals filed for this visit.  Estimated body mass index is 26.09 kg/m as calculated from the following:   Height as of 05/26/23: 5\' 7"  (1.702 m).   Weight as of 05/26/23: 166 lb 9.6 oz (75.6 kg).   PERFORMANCE STATUS (ECOG) : 1 - Symptomatic but completely ambulatory  Assessment NAD RRR Normal breathing pattern AAO x4, slow response   Discussed the use of AI scribe software for clinical note transcription with the patient, who gave verbal consent to proceed.   IMPRESSION:   [name] presents to clinic today for follow-up after newly found disease progression. Her cousin is present and brother engaged in  discussions via speakerphone. Patient is ambulatory. Alert and able to engage in discussions appropriately. Denies nausea, vomiting, constipation, or diarrhea. Some ongoing headaches, fatigue, and left facial/head tenderness to touch.   The patient is under the care of Dr. Arsenio Loader and after discussions she has made the decision to proceed with a course of radiation therapy and Avastin treatment. The patient has previously undergone similar treatment and is familiar with the potential side effects, including fatigue and loss of appetite.  She reports a persistent pain on the right side, which has been present daily and is not relieved by current medications. The pain is significant enough to warrant a mention to the medical team, indicating a potential need for additional pain management strategies during the upcoming treatment course.  After discussions she will continue with dexamethasone as prescribed by Dr. Barbaraann Cao and occasional use of Tylenol.  Patient will notify obtain if pain worsens.  Goals of care  We discussed at length patient's current cancer recurrence, overall health, and was most important to [name] and her family.  The patient's emotional and psychological well-being is also a significant concern. The recurrence of the cancer has understandably caused fear and anxiety.  she has a strong support system, including a cousin and her brother who is also her pastor.  They are both actively involved in her care. The patient expresses a desire for transparency and honesty from the medical team, indicating a preference for direct communication about her condition and prognosis.  The patient's overall goal is not only to fight the disease but also to help others who are dealing with cancer. Despite the challenges, the patient remains hopeful and determined, demonstrating resilience in the face of adversity.  We discussed at length while [name] is remaining hopeful for the best she and family are also  preparing for the worst (further disease progression).  She has a strong [place] faith and is relying on this to support her throughout this upcoming journey.  Patient and family are realistic in their understanding.  We discussed what care would look like if further progression or decreasing quality of life.  Education provided on hospice support as inquired by patient and family.  Again [name] focus on treating the treatable with hopes of similar positive responses as she did in the past.  She and family verbalized understanding of treatment plan and expressed wishes to maintain close contact for ongoing support as she navigates the upcoming weeks involved in treatment.  They are aware palliative will continue to support in collaboration with her oncology medical team as needed.  08/29/2022: [name] expresses the desire to live each day to its fullest. She is concerned that the new lesions on top of her head represent cancer spread.  She expresses being frustrated with seizure episodes as well as a decreased ability to concentrate. Emotional support provided through active listening and quiet presence.  Assessment and Plan  Recurrent Cancer   Patient is scheduled to undergo radiation therapy and Avastin treatment.  She is experiencing pain on left side of face and head.  During our visit patient reports discomfort is manageable at this time.  Will plan to notify medical team if symptoms worsen. - Proceed with radiation therapy and Avastin as planned and confirmed by patient and family under the care of Dr. Barbaraann Cao.   - Monitor patient's pain and adjust management as necessary.   - Recheck blood pressure due to patient's report of elevated readings.  Repeat reading 146/83.  Psychosocial Aspects/Goals of care Patient and family express concerns about the prognosis and potential rapid decline. She values transparency and open communication about the disease process and treatment plan.   -  Continue to provide clear and honest communication about the patient's condition and treatment plan.   - Offer emotional and psychological support as needed.   - Encourage patient's involvement in support groups or similar activities as she expressed interest in helping others with cancer.   -Patient and family are realistic in their understanding of her current disease progression.  Goals remain clear to continue to treat the treatable allowing patient every opportunity to continue to thrive.  Expresses her quality of life is most valued.  Follow-up   Patient will be seen during radiation treatment sessions and when she comes in for appointments with Dr. Arsenio Loader as needed.   - Monitor patient closely during treatment sessions.   - Address any new symptoms or concerns promptly.   - Recheck blood pressure due to patient's report of elevated readings. -I will plan to see patient back in 1-2 weeks in collaboration with oncology appointments.  She knows to contact office sooner if needed.  Patient expressed understanding and was in agreement with this plan. She also understands that She can call the clinic at any time with any questions, concerns, or complaints.   Visit consisted of counseling and education dealing with the complex and emotionally intense issues of symptom management and palliative care in the setting of serious and potentially life-threatening illness.  Willette Alma, AGPCNP-BC  Palliative Medicine Team/[facility]

## 2023-05-29 ENCOUNTER — Ambulatory Visit: Payer: BC Managed Care – PPO | Admitting: Internal Medicine

## 2023-05-29 ENCOUNTER — Other Ambulatory Visit: Payer: BC Managed Care – PPO

## 2023-05-29 ENCOUNTER — Inpatient Hospital Stay (HOSPITAL_BASED_OUTPATIENT_CLINIC_OR_DEPARTMENT_OTHER): Payer: BC Managed Care – PPO | Admitting: Nurse Practitioner

## 2023-05-29 ENCOUNTER — Inpatient Hospital Stay: Payer: BC Managed Care – PPO | Admitting: Internal Medicine

## 2023-05-29 ENCOUNTER — Telehealth: Payer: Self-pay

## 2023-05-29 ENCOUNTER — Other Ambulatory Visit: Payer: Self-pay | Admitting: Radiation Therapy

## 2023-05-29 VITALS — BP 159/94 | HR 76 | Temp 97.3°F | Resp 14 | Wt 169.9 lb

## 2023-05-29 DIAGNOSIS — G893 Neoplasm related pain (acute) (chronic): Secondary | ICD-10-CM

## 2023-05-29 DIAGNOSIS — C719 Malignant neoplasm of brain, unspecified: Secondary | ICD-10-CM

## 2023-05-29 DIAGNOSIS — Z515 Encounter for palliative care: Secondary | ICD-10-CM

## 2023-05-29 DIAGNOSIS — R530 Neoplastic (malignant) related fatigue: Secondary | ICD-10-CM | POA: Diagnosis not present

## 2023-05-29 DIAGNOSIS — Z7189 Other specified counseling: Secondary | ICD-10-CM | POA: Diagnosis not present

## 2023-05-29 NOTE — Telephone Encounter (Signed)
Patient called and reported feeling weak and tired and wanted her appts to be rescheduled. Attempted to call back to notify pt of change, no answer, LVM and callback number.

## 2023-05-29 NOTE — Progress Notes (Signed)
I connected with [name] on 05/29/23 at 10:00 AM EST by telephone visit and verified that I am speaking with the correct person using two identifiers.  I discussed the limitations, risks, security and privacy concerns of performing an evaluation and management service by telemedicine and the availability of in-person appointments. I also discussed with the patient that there may be a patient responsible charge related to this service. The patient expressed understanding and agreed to proceed.  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  She and her family have a number of questions regarding treatment plan options for her recurrent brain tumor.  Denies seizures, headaches.  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  After extensive discussion, patient would like to proceed with salvage radiation with concurrent avastin 10mg /kg IV q2 weeks. [email or web address]  This was detailed in our previous note, avastin has been well tolerated by her previously.  We will review case in detail in CNS tumor board Monday with radiation oncology team.    Avastin treatment plan order will be placed as well.  Follow Up Instructions: RTC for initial infusion.  I discussed the assessment and treatment plan with the patient.  The patient was provided an opportunity to ask questions and all were answered.  The patient agreed with the plan and demonstrated understanding of the instructions.    The patient was advised to call back or seek an in-person evaluation if the symptoms worsen or if the condition fails to  improve as anticipated.    [name] Leber, MD   I provided 20 minutes of non face-to-face telephone visit time during this encounter, and > 50% was spent counseling as documented under my assessment & plan.

## 2023-05-30 ENCOUNTER — Encounter: Payer: Self-pay | Admitting: Nurse Practitioner

## 2023-05-30 ENCOUNTER — Encounter: Payer: Self-pay | Admitting: Internal Medicine

## 2023-05-31 ENCOUNTER — Telehealth: Payer: Self-pay | Admitting: Internal Medicine

## 2023-05-31 NOTE — Telephone Encounter (Signed)
Marland Kitchen

## 2023-06-02 ENCOUNTER — Inpatient Hospital Stay: Payer: BC Managed Care – PPO

## 2023-06-02 NOTE — Progress Notes (Signed)
Radiation Oncology         [phone] ________________________________  Initial outpatient Re-Consultation  Name: [name] MRN: [id number]  Date: 06/03/2023  DOB: 1965-05-27  WU:[id number], [name] Keen, NP  [name] [name] Lea, MD   REFERRING PHYSICIAN: Henreitta Leber, MD  DIAGNOSIS:  No diagnosis found.  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 She was initially diagnosed in 2022. She had undergone a left temporal craniotomy, resection under the care of Dr. Maurice Small on 11-21-20. She then underwent radiation therapy with last treatment being on 02-07-21.   Patient presented for a restaging MRI on 05-21-23 which showed new area of contrast enhancement in the anterior inferior left frontal lobe measuring 1.9 x 0.6 x 1.3 cm in the greatest extend. Enhancement extends to the left frontal horn and are concerning for disease progression. Scan also showed slightly increased nodular enhancement along the atrium in left temporal horn also concerning for disease progression.    She presented for a follow up on 05-26-23 with Dr. Barbaraann [name] discuss symptoms and MRI results. At that time, she complained of ongoing decline in expressive language, she was unable to recall or complete sentences. She was also experiencing short term memory impairment along with fatigue, dizziness, and throbbing pain on left side of face and head.  She presented for another follow up on 05-29-23 to discuss treatment plans. After extensive discussion, it was decided to proceed with salvage radiation with concurrent avastin 10mg /kg IV q2 weeks.   Of note: during her most recent visit with palliative care on 05-29-23, she complained of a persistent  pain on the right side, which has been present daily and is not relieved by current medications. She's currently on dexamethasone and tylenol as needed.      PREVIOUS RADIATION THERAPY: Yes 12/27/2020 through 02/07/2021 Site Technique Total Dose (Gy) Dose per Fx (Gy) Completed Fx Beam Energies  Temporal Lobe: Brain_L_Tempo IMRT 46/46 2 23/23 6X  Temporal Lobe: Brain_Bst_L_tempo IMRT 14/14 2 7/7 6X    PAST MEDICAL HISTORY:  has a past medical history of Bell's palsy, Brain cancer ([name]), Complication of anesthesia, Essential hypertension, External hemorrhoids, History of blood transfusion, History of pneumonia, Hyperlipidemia, Hyperthyroidism, Iron deficiency anemia, Migraine headache, Pneumonia, PONV (postoperative nausea and vomiting), PPD positive, Pulmonary nodules, PVC's (premature ventricular contractions), Seizure ([name]) (11/15/2020), Sleep apnea, and Tuberculosis.    PAST SURGICAL HISTORY: Past Surgical History:  Procedure Laterality Date   ABDOMINAL HYSTERECTOMY     APPLICATION OF CRANIAL NAVIGATION N/A 11/21/2020   Procedure: APPLICATION OF CRANIAL NAVIGATION;  Surgeon: Jadene Pierini, MD;  Location: [facility] OR;  Service: Neurosurgery;  Laterality: N/A;   BREAST BIOPSY Right 2014   BREAST BIOPSY Left 10/29/2019   CHOLECYSTECTOMY  2014   COLONOSCOPY  05/04/2009   prolapsing external hemorrhoids   CRANIOTOMY Left 11/21/2020   Procedure: Left craniotomy for tumor resection;  Surgeon: Jadene Pierini, MD;  Location: [facility] OR;  Service: Neurosurgery;  Laterality: Left;   ENDOMETRIAL ABLATION     HEMORRHOID SURGERY     x 3   TUBAL LIGATION      FAMILY HISTORY: family history includes Colon cancer in her maternal aunt and maternal uncle; Diabetes Mellitus II in her maternal aunt, maternal grandmother, maternal uncle, and sister; Diabetes  type II in her mother; Heart disease in her maternal grandmother; Kidney failure in her sister; Lung cancer in an other family member; Pancreatic  cancer in her maternal aunt.  SOCIAL HISTORY:  reports that she has never smoked. She has never used smokeless tobacco. She reports that she does not drink alcohol and does not use drugs.  ALLERGIES: Patient has no known allergies.  MEDICATIONS:  Current Outpatient Medications  Medication Sig Dispense Refill   amLODipine (NORVASC) 10 MG tablet TAKE 1 TABLET BY MOUTH EVERY DAY 90 tablet 1   dexamethasone (DECADRON) 2 MG tablet Take 1 tablet (2 mg total) by mouth daily. 30 tablet 1   HYDROcodone bit-homatropine (HYCODAN) 5-1.5 MG/5ML syrup Take 5 mLs by mouth every 8 (eight) hours as needed for cough. 120 mL 0   Lacosamide 100 MG TABS TAKE 1 TABLET BY MOUTH TWICE A DAY 60 tablet 3   levETIRAcetam (KEPPRA) 750 MG tablet TAKE 2 TABLETS (1,500 MG TOTAL) BY MOUTH 2 (TWO) TIMES DAILY. 360 tablet 1   levothyroxine (SYNTHROID) 100 MCG tablet Take 1 tablet (100 mcg total) by mouth daily. 90 tablet 3   LORazepam (ATIVAN) 2 MG tablet Take 1 tablet (2 mg total) by mouth every 8 (eight) hours as needed for seizure. 12 tablet 0   losartan (COZAAR) 50 MG tablet TAKE 1 TABLET BY MOUTH EVERY DAY 90 tablet 1   ondansetron (ZOFRAN-ODT) 4 MG disintegrating tablet Take 1 tablet (4 mg total) by mouth every 8 (eight) hours as needed for nausea or vomiting. 10 tablet 0   Potassium Chloride ER 20 MEQ TBCR Take 1 tablet (20 mEq total) by mouth daily. 90 tablet 3   prochlorperazine (COMPAZINE) 10 MG tablet Take 10 mg by mouth every 6 (six) hours as needed for nausea or vomiting.     No current facility-administered medications for this encounter.    REVIEW OF SYSTEMS:  As above.   PHYSICAL EXAM:  vitals were not taken for this visit.   General: Alert and oriented, in no acute distress *** HEENT: Head is normocephalic. Extraocular movements are intact. Oropharynx is clear. Neck: Neck is supple, no palpable cervical or supraclavicular lymphadenopathy. Heart: Regular in rate and rhythm with no murmurs, rubs, or  gallops. Chest: Clear to auscultation bilaterally, with no rhonchi, wheezes, or rales. Abdomen: Soft, nontender, nondistended, with no rigidity or guarding. Extremities: No cyanosis or edema. Lymphatics: see Neck Exam Skin: No concerning lesions. Musculoskeletal: symmetric strength and muscle tone throughout. Neurologic: Cranial nerves II through XII are grossly intact. No obvious focalities. Speech is fluent. Coordination is intact. Psychiatric: Judgment and insight are intact. Affect is appropriate.   LABORATORY DATA:  Lab Results  Component Value Date   WBC 5.1 03/27/2023   HGB 15.4 (H) 03/27/2023   HCT 46.4 (H) 03/27/2023   MCV 92.2 03/27/2023   PLT 141 (L) 03/27/2023   CMP     Component Value Date/Time   NA 137 03/27/2023 1211   K 3.7 03/27/2023 1211   CL 103 03/27/2023 1211   CO2 26 03/27/2023 1211   GLUCOSE 129 (H) 03/27/2023 1211   BUN 11 03/27/2023 1211   CREATININE 1.05 (H) 03/27/2023 1211   CREATININE 0.92 01/25/2020 1424   CALCIUM 10.1 03/27/2023 1211   PROT 8.0 03/27/2023 1211   ALBUMIN 4.5 03/27/2023 1211   AST 17 03/27/2023 1211   ALT 15 03/27/2023 1211   ALKPHOS 141 (H) 03/27/2023 1211   BILITOT 1.6 (H) 03/27/2023 1211   GFR 73.97  04/24/2021 1546   GFRNONAA >60 03/27/2023 1211   GFRNONAA 72 01/25/2020 1424         RADIOGRAPHY: MM 3D SCREENING MAMMOGRAM BILATERAL BREAST Result Date: 05/22/2023 CLINICAL DATA:  Screening. EXAM: DIGITAL SCREENING BILATERAL MAMMOGRAM WITH TOMOSYNTHESIS AND CAD TECHNIQUE: Bilateral screening digital craniocaudal and mediolateral oblique mammograms were obtained. Bilateral screening digital breast tomosynthesis was performed. The images were evaluated with computer-aided detection. COMPARISON:  Previous exam(s). ACR Breast Density Category c: The breasts are heterogeneously dense, which may obscure small masses. FINDINGS: There are no findings suspicious for malignancy. IMPRESSION: No mammographic evidence of malignancy. A  result letter of this screening mammogram will be mailed directly to the patient. RECOMMENDATION: Screening mammogram in one year. (Code:SM-B-01Y) BI-RADS CATEGORY  1: Negative. Electronically Signed   By: Elberta Fortis M.D.   On: 05/22/2023 16:24   MR BRAIN W WO CONTRAST Result Date: 05/21/2023 CLINICAL DATA:  Glioblastoma, assess treatment response, facial pain for 1 month EXAM: MRI HEAD WITHOUT AND WITH CONTRAST TECHNIQUE: Multiplanar, multiecho pulse sequences of the brain and surrounding structures were obtained without and with intravenous contrast. CONTRAST:  7 mL Gadavist COMPARISON:  03/24/2023 FINDINGS: Brain: New area of contrast enhancement in the anterior inferior left frontal lobe, which measures up to 1.9 x 0.6 x 1.3 cm (AP x TR x CC) (series 16, image 85 and series 18, image 14). In retrospect, there was faint enhancement in this region on the prior exam. Enhancement extends to the left frontal horn (series 16, image 98). Status post left temporal craniotomy with subjacent resection cavity in the left anterior temporal lobe. Redemonstrated diffusion restriction in the left periatrial white matter (series 5, image 25 and 35), which is associated with contrast enhancement that is better seen on the coronal images. Enhancement in the left medial periventricular white matter (series 19, image 12) appears similar to the prior exam. Nodular enhancement along the atrium and left temporal horn of the left lateral ventricle appears slightly increased posteriorly (series 19, image 12), which may correlate with slightly increased diffusion restricting signal (series 5, image 27). An additional area of diffusion restriction is noted along the medial aspect of the atrium (series 5, image 27), although this does not definitively correlate with contrast enhancement and appears similar to the prior exam. Overall unchanged associated T2 hyperintense signal. No evidence of acute infarct, hemorrhage, mass effect, or  midline shift. No hydrocephalus or extra-axial collection. Vascular: Normal arterial flow voids. Skull and upper cervical spine: Prior left temporal craniotomy. Otherwise normal marrow signal. Sinuses/Orbits: Mucosal thickening in the ethmoid air cells. No acute finding in the orbits. IMPRESSION: 1. New area of enhancement in the anterior inferior left frontal lobe, adjacent to the left frontal horn, concerning for disease progression. 2. Slightly increased nodular enhancement along the atrium in left temporal horn, also concerning for disease progression. Electronically Signed   By: Wiliam Ke M.D.   On: 05/21/2023 19:06      IMPRESSION/PLAN: This is a very pleasant 58 year old female with metastatic disease to the brain.  I had a lengthy discussion with the patient after reviewing their MRI results with them.  We spoke about whole brain radiotherapy versus stereotactic radiosurgery to the brain. We spoke about the differing risks benefits and side effects of both of these treatments. During part of our discussion, we spoke about the hair loss, fatigue and cognitive effects that can result from whole brain radiotherapy.  Additionally, we spoke about radionecrosis that can result from stereotactic  radiosurgery. I explained that whole brain radiotherapy is more comprehensive and therefore can decrease the chance of recurrences elsewhere in the brain, while stereotactic radiosurgery only treats the areas of gross disease while sparing the rest of the brain parenchyma.  After lengthy discussion, the patient would like to proceed with stereotactic brain radiosurgery to their metastatic disease. They will meet with neurosurgery in the near future to discuss this further; a neurosurgeon will participate in their case.  CT simulation will take place on *** and treatment on ***.  I plan to deliver *** Gy in 1 fraction to ***.  On date of service, in total, I spent *** minutes on this encounter. Patient was  seen in person.   __________________________________________   Lonie Peak, MD  This document serves as a record of services personally performed by Lonie Peak, MD. It was created on her behalf by Herbie Saxon, a trained medical scribe. The creation of this record is based on the scribe's personal observations and the provider's statements to them. This document has been checked and approved by the attending provider.

## 2023-06-02 NOTE — Progress Notes (Signed)
Location/Histology of Brain Tumor:  Glioblastoma with isocitrate dehydrogenase gene wildtype  Patient presented with symptoms of:  evidence of disease progression on recent imaging: MRI Brain w/ & w/o Contrast 05/21/2023 --IMPRESSION: New area of enhancement in the anterior inferior left frontal lobe, adjacent to the left frontal horn, concerning for disease progression. Slightly increased nodular enhancement along the atrium in left temporal horn, also concerning for disease progression.  Past or anticipated interventions, if any, per neurosurgery:  No new/recent procedures or interventions   11/21/2020 Dr. Autumn Patty --Left craniotomy for tumor resection  Past or anticipated interventions, if any, per medical oncology:  05/29/2023 --Dr. Elissa Hefty (telephone visit) Clinically stable.  After extensive discussion, patient would like to proceed with salvage radiation with concurrent avastin 10mg /kg IV q2 weeks. [email or web address] This was detailed in our previous note, avastin has been well tolerated by her previously. We will review case in detail in CNS tumor board Monday with radiation oncology team.   Avastin treatment plan order will be placed as well. --Follow Up Instructions: RTC for initial infusion.  05/26/2023 --Dr. Elissa Hefty (office visit) [name] is clinically progressive today, with worsening expressive language deficits and balance.  MRI brain demonstrates progression of disease, with increase in volume of enhancement within mesial left frontal lobe.  Etiology is unclear, would favor organic progression of disease, but avastin washout effect should also be considered.  Prior study was 2-3 weeks after final avastin infusion in late October.  Presence of worsening language deficits, on the other hand, pushes [place] towards neoplastic progression. We provided three options moving forward, reviewing details extensively: Return to  irinotecan+avastin infusions which had been effective previously; last administered June 2024. Re-irradiation 35/10x given long duration from prior RT and distance from initial high dose field.  This would be given with concurrent avastin. Repeating MRI brain in 1 month to rule out avastin washout MRI artifact prior to initiating salvage therapy Did recommend resuming decadron 2mg  daily in the interim. For seizures, will con't Vimpat 100/100. Keppra will also continue 1000mg  BID. --We ask that [name] return to clinic following goals of care decision  Dose of Decadron, if applicable: 2mg  PO daily  Recent neurologic symptoms, if any:  Seizures: yes Headaches: yes, patient rates pain at 5/10. Nausea: no Dizziness/ataxia: yes dizziness with headaches.  Difficulty with hand coordination: no Focal numbness/weakness: no Visual deficits/changes: no Confusion/Memory deficits: yes, memory issues with seizures.   Painful bone metastases at present, if any:   SAFETY ISSUES: Prior radiation? Yes: 12/27/2020 through 02/07/2021 Site Technique Total Dose (Gy) Dose per Fx (Gy) Completed Fx Beam Energies  Temporal Lobe: Brain_L_Tempo IMRT 46/46 2 23/23 6X  Temporal Lobe: Brain_Bst_L_tempo IMRT 14/14 2 7/7 6X   Pacemaker/ICD? No Possible current pregnancy? No--hysterectomy  Is the patient on methotrexate?  No  Additional Complaints / other details:   BP (!) 141/87 (BP Location: Left Arm, Patient Position: Sitting)   Pulse 61   Temp (!) 97.3 F (36.3 C) (Temporal)   Resp 18   Ht 5\' 7"  (1.702 m)   Wt 169 lb 2 oz (76.7 kg)   SpO2 100%   BMI 26.49 kg/m

## 2023-06-03 ENCOUNTER — Ambulatory Visit
Admission: RE | Admit: 2023-06-03 | Discharge: 2023-06-03 | Disposition: A | Discharge status: Home / Self Care | Payer: BC Managed Care – PPO | Source: Ambulatory Visit | Attending: Radiation Oncology | Admitting: Radiation Oncology

## 2023-06-03 ENCOUNTER — Encounter: Payer: Self-pay | Admitting: Nutrition

## 2023-06-03 ENCOUNTER — Encounter: Payer: Self-pay | Admitting: Radiation Oncology

## 2023-06-03 VITALS — BP 141/87 | HR 61 | Temp 97.3°F | Resp 18 | Ht 67.0 in | Wt 169.1 lb

## 2023-06-03 DIAGNOSIS — R519 Headache, unspecified: Secondary | ICD-10-CM | POA: Insufficient documentation

## 2023-06-03 DIAGNOSIS — C712 Malignant neoplasm of temporal lobe: Secondary | ICD-10-CM

## 2023-06-03 DIAGNOSIS — I10 Essential (primary) hypertension: Secondary | ICD-10-CM | POA: Insufficient documentation

## 2023-06-03 DIAGNOSIS — Z7952 Long term (current) use of systemic steroids: Secondary | ICD-10-CM | POA: Insufficient documentation

## 2023-06-03 DIAGNOSIS — D509 Iron deficiency anemia, unspecified: Secondary | ICD-10-CM | POA: Diagnosis not present

## 2023-06-03 DIAGNOSIS — Z8719 Personal history of other diseases of the digestive system: Secondary | ICD-10-CM | POA: Insufficient documentation

## 2023-06-03 DIAGNOSIS — G4730 Sleep apnea, unspecified: Secondary | ICD-10-CM | POA: Diagnosis not present

## 2023-06-03 DIAGNOSIS — Z79899 Other long term (current) drug therapy: Secondary | ICD-10-CM | POA: Insufficient documentation

## 2023-06-03 DIAGNOSIS — R5383 Other fatigue: Secondary | ICD-10-CM | POA: Diagnosis not present

## 2023-06-03 DIAGNOSIS — Z923 Personal history of irradiation: Secondary | ICD-10-CM | POA: Diagnosis not present

## 2023-06-03 DIAGNOSIS — C719 Malignant neoplasm of brain, unspecified: Secondary | ICD-10-CM

## 2023-06-03 DIAGNOSIS — E785 Hyperlipidemia, unspecified: Secondary | ICD-10-CM | POA: Diagnosis not present

## 2023-06-03 DIAGNOSIS — Z801 Family history of malignant neoplasm of trachea, bronchus and lung: Secondary | ICD-10-CM | POA: Insufficient documentation

## 2023-06-03 DIAGNOSIS — Z79890 Hormone replacement therapy (postmenopausal): Secondary | ICD-10-CM | POA: Diagnosis not present

## 2023-06-03 DIAGNOSIS — Z800 Family history of malignant neoplasm of digestive organs: Secondary | ICD-10-CM | POA: Insufficient documentation

## 2023-06-03 DIAGNOSIS — R42 Dizziness and giddiness: Secondary | ICD-10-CM | POA: Insufficient documentation

## 2023-06-06 ENCOUNTER — Other Ambulatory Visit: Payer: Self-pay | Admitting: Internal Medicine

## 2023-06-06 ENCOUNTER — Telehealth: Payer: Self-pay | Admitting: Internal Medicine

## 2023-06-06 DIAGNOSIS — C719 Malignant neoplasm of brain, unspecified: Secondary | ICD-10-CM

## 2023-06-06 DIAGNOSIS — C712 Malignant neoplasm of temporal lobe: Secondary | ICD-10-CM | POA: Diagnosis not present

## 2023-06-06 NOTE — Progress Notes (Signed)
DISCONTINUE ON PATHWAY REGIMEN - Neuro     A cycle is every 42 days:     Lomustine   **Always confirm dose/schedule in your pharmacy ordering system**  PRIOR TREATMENT: [id number]: Lomustine 110 mg/m2 D1 q42 Days  START ON PATHWAY REGIMEN - Neuro     A cycle is every 14 days:     Bevacizumab-xxxx   **Always confirm dose/schedule in your pharmacy ordering system**  Patient Characteristics: Glioma, Glioblastoma, IDH-wildtype, Recurrent or Progressive, Nonsurgical Candidate, Systemic Therapy Candidate, BRAF V600E Mutation Negative/Unknown and NTRK Fusion Negative/Unknown Disease Classification: Glioma Disease Classification: Glioblastoma, IDH-wildtype Disease Status: Recurrent or Progressive Treatment Classification: Nonsurgical Candidate Treatment (Nonsurgical/Adjuvant): Systemic Therapy Candidate NTRK Gene Fusion Status: Negative BRAF V600E Mutation Status: Negative Intent of Therapy: Non-Curative / Palliative Intent, Discussed with Patient

## 2023-06-06 NOTE — Telephone Encounter (Signed)
 Marland Kitchen

## 2023-06-11 ENCOUNTER — Ambulatory Visit
Admission: RE | Admit: 2023-06-11 | Discharge: 2023-06-11 | Disposition: A | Discharge status: Home / Self Care | Payer: BC Managed Care – PPO | Source: Ambulatory Visit | Attending: Radiation Oncology | Admitting: Radiation Oncology

## 2023-06-11 DIAGNOSIS — Z79899 Other long term (current) drug therapy: Secondary | ICD-10-CM | POA: Diagnosis not present

## 2023-06-11 DIAGNOSIS — Z510 Encounter for antineoplastic radiation therapy: Secondary | ICD-10-CM | POA: Insufficient documentation

## 2023-06-11 DIAGNOSIS — C712 Malignant neoplasm of temporal lobe: Secondary | ICD-10-CM | POA: Diagnosis not present

## 2023-06-11 DIAGNOSIS — Z5111 Encounter for antineoplastic chemotherapy: Secondary | ICD-10-CM | POA: Diagnosis not present

## 2023-06-13 ENCOUNTER — Other Ambulatory Visit: Payer: Self-pay | Admitting: *Deleted

## 2023-06-15 ENCOUNTER — Other Ambulatory Visit: Payer: Self-pay | Admitting: Internal Medicine

## 2023-06-16 ENCOUNTER — Encounter: Payer: Self-pay | Admitting: Internal Medicine

## 2023-06-16 ENCOUNTER — Telehealth: Payer: Self-pay | Admitting: *Deleted

## 2023-06-16 ENCOUNTER — Other Ambulatory Visit: Payer: Self-pay | Admitting: Internal Medicine

## 2023-06-16 ENCOUNTER — Inpatient Hospital Stay: Payer: BC Managed Care – PPO | Attending: Internal Medicine

## 2023-06-16 DIAGNOSIS — Z5111 Encounter for antineoplastic chemotherapy: Secondary | ICD-10-CM | POA: Insufficient documentation

## 2023-06-16 DIAGNOSIS — Z79899 Other long term (current) drug therapy: Secondary | ICD-10-CM | POA: Insufficient documentation

## 2023-06-16 DIAGNOSIS — C712 Malignant neoplasm of temporal lobe: Secondary | ICD-10-CM | POA: Insufficient documentation

## 2023-06-16 MED ORDER — LACOSAMIDE 100 MG PO TABS: 2.0000 | ORAL_TABLET | Freq: Two times a day (BID) | ORAL | 2 refills | Status: DC

## 2023-06-16 NOTE — Progress Notes (Signed)
Palliative Medicine [facility]  [phone] [phone]   Name: MICHAELLA IMAI Date: 06/16/2023 MRN: [id number]  DOB: July 25, 1965  Patient Care Team: Shirline Frees, NP as PCP - General (Family Medicine) Trilby Leaver, MD as Referring Physician (Gastroenterology) Malachy Mood, MD as Consulting Physician (Oncology) Barbaraann Cao Georgeanna Lea, MD as Consulting Physician (Oncology)   INTERVAL HISTORY: [name] is a 58 y.o. female with medical history of glioblastoma s/p resection (11/2020) IMRT, currently on dexamethasone and Temodar, pancreatic lesion, hypertension, and hyperlipidemia.  Palliative ask to see for symptom management and goals of care.   SOCIAL HISTORY:    [name] reports that she has never smoked. She has never used smokeless tobacco. She reports that she does not drink alcohol and does not use drugs.  ADVANCE DIRECTIVES:  None on file  CODE STATUS: Full code  PAST MEDICAL HISTORY: Past Medical History:  Diagnosis Date   Bell's palsy    left side of face- notied in smile and eyes, if very tired   Brain cancer ([name])    Complication of anesthesia    N/V   Essential hypertension    External hemorrhoids    History of blood transfusion    post op Hemorroids   History of pneumonia    Hyperlipidemia    Hyperthyroidism    hx 58 years old- oral meds   Iron deficiency anemia    Migraine headache    Pneumonia    x 2 - years ago   PONV (postoperative nausea and vomiting)    PPD positive    Pulmonary nodules    not seen on plain cxr, first detected 1/07 re ct 10/08 pos IPPD > 15 mm 12/09 FOB/lavage 04/20/08 neg afb smear   PVC's (premature ventricular contractions)    Seizure ([name]) 11/15/2020   Sleep apnea    refused CPAP- mouth piece recommended   Tuberculosis    in her late 30's    ALLERGIES:  has no known allergies.  MEDICATIONS:  Current Outpatient Medications  Medication Sig Dispense Refill   amLODipine  (NORVASC) 10 MG tablet TAKE 1 TABLET BY MOUTH EVERY DAY 90 tablet 1   dexamethasone (DECADRON) 2 MG tablet Take 1 tablet (2 mg total) by mouth daily. 30 tablet 1   HYDROcodone bit-homatropine (HYCODAN) 5-1.5 MG/5ML syrup Take 5 mLs by mouth every 8 (eight) hours as needed for cough. 120 mL 0   Lacosamide 100 MG TABS TAKE 1 TABLET BY MOUTH TWICE A DAY 60 tablet 3   levETIRAcetam (KEPPRA) 750 MG tablet TAKE 2 TABLETS (1,500 MG TOTAL) BY MOUTH 2 (TWO) TIMES DAILY. 360 tablet 1   levothyroxine (SYNTHROID) 100 MCG tablet Take 1 tablet (100 mcg total) by mouth daily. 90 tablet 3   LORazepam (ATIVAN) 2 MG tablet TAKE 1 TABLET (2 MG TOTAL) BY MOUTH EVERY 8 (EIGHT) HOURS AS NEEDED FOR SEIZURE. 12 tablet 0   losartan (COZAAR) 50 MG tablet TAKE 1 TABLET BY MOUTH EVERY DAY 90 tablet 1   ondansetron (ZOFRAN-ODT) 4 MG disintegrating tablet Take 1 tablet (4 mg total) by mouth every 8 (eight) hours as needed for nausea or vomiting. 10 tablet 0   Potassium Chloride ER 20 MEQ TBCR Take 1 tablet (20 mEq total) by mouth daily. 90 tablet 3   prochlorperazine (COMPAZINE) 10 MG tablet Take 10 mg by mouth every 6 (six) hours as needed for nausea or vomiting.     No current facility-administered medications for  this visit.    VITAL SIGNS: There were no vitals taken for this visit. There were no vitals filed for this visit.  Estimated body mass index is 26.49 kg/m as calculated from the following:   Height as of 06/03/23: 5\' 7"  (1.702 m).   Weight as of 06/03/23: 169 lb 2 oz (76.7 kg).   PERFORMANCE STATUS (ECOG) : 1 - Symptomatic but completely ambulatory  Assessment NAD RRR Normal breathing pattern AAO x4, slow response   Discussed the use of AI scribe software for clinical note transcription with the patient, who gave verbal consent to proceed.  Assessment NAD RRR Normal breathing pattern AAO x3  IMPRESSION:   I saw [name] during her infusion today. She is accompanied by her  daughter. Doing well overall. She is starting cycle 1 of MVasi today. Shares hopefulness that she will tolerate without difficulty as she did previous treatments with expected response. She is tolerating radiation without difficulty. Denies nausea, vomiting, constipation, or diarrhea. Does endorse increase in fatigue however this improves several hours after her radiation treatment with rest. States she is able to currently still work from home for 4 hours day. She is appreciative of this.  She finds her job supportive and 'really nice' to her.  No symptom needs at this time however patient request to maintain close follow-up in case things change and for ongoing psychosocial support.   All questions answered and support provided.  Goals of care We discussed at length patient's current cancer recurrence, overall health, and was most important to TJ and her family.  The patient's emotional and psychological well-being is also a significant concern. The recurrence of the cancer has understandably caused fear and anxiety.  she has a strong support system, including a cousin and her brother who is also her pastor.  They are both actively involved in her care. The patient expresses a desire for transparency and honesty from the medical team, indicating a preference for direct communication about her condition and prognosis.  The patient's overall goal is not only to fight the disease but also to help others who are dealing with cancer. Despite the challenges, the patient remains hopeful and determined, demonstrating resilience in the face of adversity.  We discussed at length while TJ is remaining hopeful for the best she and family are also preparing for the worst (further disease progression).  She has a strong [place] faith and is relying on this to support her throughout this upcoming journey.  Patient and family are realistic in their understanding.  We discussed what care would look like if further  progression or decreasing quality of life.  Education provided on hospice support as inquired by patient and family.  Again Ms. Ballard's focus on treating the treatable with hopes of similar positive responses as she did in the past.  She and family verbalized understanding of treatment plan and expressed wishes to maintain close contact for ongoing support as she navigates the upcoming weeks involved in treatment.  They are aware palliative will continue to support in collaboration with her oncology medical team as needed.  08/29/2022: Ms. Ledwell expresses the desire to live each day to its fullest. She is concerned that the new lesions on top of her head represent cancer spread. She expresses being frustrated with seizure episodes as well as a decreased ability to concentrate. Emotional support provided through active listening and quiet presence.  Assessment and Plan  Cancer Treatment Patient is currently receiving MVasi treatment,  today is Cycle 1 of 1. Tolerating radiation with intermittent fatigue.  -Check in with patient next week to monitor progress and side effects. -Advise patient to call if any changes occur before the scheduled check-in. -No symptom management needs at this time.  Psychosocial Aspects/Goals of care Patient and family express concerns about the prognosis and potential rapid decline. She values transparency and open communication about the disease process and treatment plan.   - Continue to provide clear and honest communication about the patient's condition and treatment plan.   - Offer emotional and psychological support as needed.   - Encourage patient's involvement in support groups or similar activities as she expressed interest in helping others with cancer.   -Patient and family are realistic in their understanding of her current disease progression.  Goals remain clear to continue to treat the treatable allowing patient every opportunity to continue to thrive.   Expresses her quality of life is most valued.  Patient expressed understanding and was in agreement with this plan. She also understands that She can call the clinic at any time with any questions, concerns, or complaints.   Visit consisted of counseling and education dealing with the complex and emotionally intense issues of symptom management and palliative care in the setting of serious and potentially life-threatening illness.  Willette Alma, AGPCNP-BC  Palliative Medicine Team/[facility]

## 2023-06-16 NOTE — Telephone Encounter (Signed)
 Mychart message received regarding new seizures.  Returned call to [place] to advise of changes.  Wheelchair was also ordered at this time due to seizure activity that required several people to lift her yesterday.

## 2023-06-17 DIAGNOSIS — Z510 Encounter for antineoplastic radiation therapy: Secondary | ICD-10-CM | POA: Diagnosis not present

## 2023-06-17 DIAGNOSIS — Z79899 Other long term (current) drug therapy: Secondary | ICD-10-CM | POA: Diagnosis not present

## 2023-06-17 DIAGNOSIS — C719 Malignant neoplasm of brain, unspecified: Secondary | ICD-10-CM | POA: Diagnosis not present

## 2023-06-17 DIAGNOSIS — Z5111 Encounter for antineoplastic chemotherapy: Secondary | ICD-10-CM | POA: Diagnosis not present

## 2023-06-17 DIAGNOSIS — F445 Conversion disorder with seizures or convulsions: Secondary | ICD-10-CM | POA: Diagnosis not present

## 2023-06-17 DIAGNOSIS — C712 Malignant neoplasm of temporal lobe: Secondary | ICD-10-CM | POA: Diagnosis not present

## 2023-06-18 ENCOUNTER — Ambulatory Visit
Admission: RE | Admit: 2023-06-18 | Discharge: 2023-06-18 | Disposition: A | Discharge status: Home / Self Care | Payer: BC Managed Care – PPO | Source: Ambulatory Visit | Attending: Radiation Oncology | Admitting: Radiation Oncology

## 2023-06-18 ENCOUNTER — Other Ambulatory Visit: Payer: Self-pay

## 2023-06-18 DIAGNOSIS — Z79899 Other long term (current) drug therapy: Secondary | ICD-10-CM | POA: Diagnosis not present

## 2023-06-18 DIAGNOSIS — C712 Malignant neoplasm of temporal lobe: Secondary | ICD-10-CM | POA: Diagnosis not present

## 2023-06-18 DIAGNOSIS — Z5111 Encounter for antineoplastic chemotherapy: Secondary | ICD-10-CM | POA: Diagnosis not present

## 2023-06-18 DIAGNOSIS — Z510 Encounter for antineoplastic radiation therapy: Secondary | ICD-10-CM | POA: Diagnosis not present

## 2023-06-18 LAB — RAD ONC ARIA SESSION SUMMARY
Course Elapsed Days: 0
Plan Fractions Treated to Date: 1
Plan Prescribed Dose Per Fraction: 3.5 Gy
Plan Total Fractions Prescribed: 10
Plan Total Prescribed Dose: 35 Gy
Reference Point Dosage Given to Date: 3.5 Gy
Reference Point Session Dosage Given: 3.5 Gy
Session Number: 1

## 2023-06-19 ENCOUNTER — Inpatient Hospital Stay: Payer: BC Managed Care – PPO

## 2023-06-19 ENCOUNTER — Ambulatory Visit
Admission: RE | Admit: 2023-06-19 | Discharge: 2023-06-19 | Disposition: A | Discharge status: Home / Self Care | Payer: BC Managed Care – PPO | Source: Ambulatory Visit | Attending: Radiation Oncology | Admitting: Radiation Oncology

## 2023-06-19 ENCOUNTER — Inpatient Hospital Stay (HOSPITAL_BASED_OUTPATIENT_CLINIC_OR_DEPARTMENT_OTHER): Payer: BC Managed Care – PPO | Admitting: Nurse Practitioner

## 2023-06-19 ENCOUNTER — Other Ambulatory Visit: Payer: Self-pay

## 2023-06-19 ENCOUNTER — Encounter: Payer: Self-pay | Admitting: Nurse Practitioner

## 2023-06-19 ENCOUNTER — Inpatient Hospital Stay (HOSPITAL_BASED_OUTPATIENT_CLINIC_OR_DEPARTMENT_OTHER): Payer: BC Managed Care – PPO | Admitting: Internal Medicine

## 2023-06-19 VITALS — BP 157/90 | HR 73 | Temp 97.6°F | Resp 16 | Ht 67.0 in | Wt 175.6 lb

## 2023-06-19 DIAGNOSIS — Z515 Encounter for palliative care: Secondary | ICD-10-CM | POA: Diagnosis not present

## 2023-06-19 DIAGNOSIS — Z510 Encounter for antineoplastic radiation therapy: Secondary | ICD-10-CM | POA: Diagnosis not present

## 2023-06-19 DIAGNOSIS — R569 Unspecified convulsions: Secondary | ICD-10-CM | POA: Diagnosis not present

## 2023-06-19 DIAGNOSIS — C719 Malignant neoplasm of brain, unspecified: Secondary | ICD-10-CM | POA: Diagnosis not present

## 2023-06-19 DIAGNOSIS — C712 Malignant neoplasm of temporal lobe: Secondary | ICD-10-CM | POA: Diagnosis not present

## 2023-06-19 DIAGNOSIS — Z5111 Encounter for antineoplastic chemotherapy: Secondary | ICD-10-CM | POA: Diagnosis not present

## 2023-06-19 DIAGNOSIS — R530 Neoplastic (malignant) related fatigue: Secondary | ICD-10-CM

## 2023-06-19 DIAGNOSIS — Z79899 Other long term (current) drug therapy: Secondary | ICD-10-CM | POA: Diagnosis not present

## 2023-06-19 LAB — CBC WITH DIFFERENTIAL (CANCER CENTER ONLY)
Abs Immature Granulocytes: 0.04 10*3/uL (ref 0.00–0.07)
Basophils Absolute: 0 10*3/uL (ref 0.0–0.1)
Basophils Relative: 0 %
Eosinophils Absolute: 0 10*3/uL (ref 0.0–0.5)
Eosinophils Relative: 0 %
HCT: 46.3 % — ABNORMAL HIGH (ref 36.0–46.0)
Hemoglobin: 15.2 g/dL — ABNORMAL HIGH (ref 12.0–15.0)
Immature Granulocytes: 1 %
Lymphocytes Relative: 20 %
Lymphs Abs: 1.4 10*3/uL (ref 0.7–4.0)
MCH: 31.6 pg (ref 26.0–34.0)
MCHC: 32.8 g/dL (ref 30.0–36.0)
MCV: 96.3 fL (ref 80.0–100.0)
Monocytes Absolute: 0.4 10*3/uL (ref 0.1–1.0)
Monocytes Relative: 5 %
Neutro Abs: 5.2 10*3/uL (ref 1.7–7.7)
Neutrophils Relative %: 74 %
Platelet Count: 165 10*3/uL (ref 150–400)
RBC: 4.81 MIL/uL (ref 3.87–5.11)
RDW: 15.6 % — ABNORMAL HIGH (ref 11.5–15.5)
WBC Count: 6.9 10*3/uL (ref 4.0–10.5)
nRBC: 0 % (ref 0.0–0.2)

## 2023-06-19 LAB — RAD ONC ARIA SESSION SUMMARY
Course Elapsed Days: 1
Plan Fractions Treated to Date: 2
Plan Prescribed Dose Per Fraction: 3.5 Gy
Plan Total Fractions Prescribed: 10
Plan Total Prescribed Dose: 35 Gy
Reference Point Dosage Given to Date: 7 Gy
Reference Point Session Dosage Given: 3.5 Gy
Session Number: 2

## 2023-06-19 LAB — TOTAL PROTEIN, URINE DIPSTICK: Protein, ur: 100 mg/dL — AB

## 2023-06-19 MED ORDER — SODIUM CHLORIDE 0.9 % IV SOLN
INTRAVENOUS | Status: DC
Start: 2023-06-19 — End: 2023-06-19

## 2023-06-19 MED ORDER — BEVACIZUMAB-AWWB CHEMO INJECTION 400 MG/16ML
10.0000 mg/kg | Freq: Once | INTRAVENOUS | Status: AC
  Administered 2023-06-19: 800 mg via INTRAVENOUS
  Filled 2023-06-19: qty 32

## 2023-06-19 NOTE — Patient Instructions (Signed)
[facility] - A DEPT OF [facility]  Discharge Instructions: Thank you for choosing [facility] to provide your oncology and hematology care.   If you have a lab appointment with the Cancer Center, please go directly to the Cancer Center and check in at the registration area.   Wear comfortable clothing and clothing appropriate for easy access to any Portacath or PICC line.   We strive to give you quality time with your provider. You may need to reschedule your appointment if you arrive late (15 or more minutes).  Arriving late affects you and other patients whose appointments are after yours.  Also, if you miss three or more appointments without notifying the office, you may be dismissed from the clinic at the provider's discretion.      For prescription refill requests, have your pharmacy contact our office and allow 72 hours for refills to be completed.    Today you received the following chemotherapy and/or immunotherapy agents: Bevacizumab      To help prevent nausea and vomiting after your treatment, we encourage you to take your nausea medication as directed.  BELOW ARE SYMPTOMS THAT SHOULD BE REPORTED IMMEDIATELY: *FEVER GREATER THAN 100.4 F (38 C) OR HIGHER *CHILLS OR SWEATING *NAUSEA AND VOMITING THAT IS NOT CONTROLLED WITH YOUR NAUSEA MEDICATION *UNUSUAL SHORTNESS OF BREATH *UNUSUAL BRUISING OR BLEEDING *URINARY PROBLEMS (pain or burning when urinating, or frequent urination) *BOWEL PROBLEMS (unusual diarrhea, constipation, pain near the anus) TENDERNESS IN MOUTH AND THROAT WITH OR WITHOUT PRESENCE OF ULCERS (sore throat, sores in mouth, or a toothache) UNUSUAL RASH, SWELLING OR PAIN  UNUSUAL VAGINAL DISCHARGE OR ITCHING   Items with * indicate a potential emergency and should be followed up as soon as possible or go to the Emergency Department if any problems should occur.  Please show the CHEMOTHERAPY ALERT CARD or  IMMUNOTHERAPY ALERT CARD at check-in to the Emergency Department and triage nurse.  Should you have questions after your visit or need to cancel or reschedule your appointment, please contact [facility] - A DEPT OF [facility]  Dept: [phone]  and follow the prompts.  Office hours are 8:00 a.m. to 4:30 p.m. Monday - Friday. Please note that voicemails left after 4:00 p.m. may not be returned until the following business day.  We are closed weekends and major holidays. You have access to a nurse at all times for urgent questions. Please call the main number to the clinic Dept: [phone] and follow the prompts.   For any non-urgent questions, you may also contact your provider using MyChart. We now offer e-Visits for anyone 67 and older to request care online for non-urgent symptoms. For details visit [email or web address].   Also download the MyChart app! Go to the app store, search "MyChart", open the app, select [facility], and log in with your MyChart username and password.

## 2023-06-19 NOTE — Progress Notes (Signed)
[facility] at [facility] [address]  [place], [place] [address] [phone]   Interval Evaluation  Date of Service: 06/19/23 Patient Name: [name] Patient MRN: [id number] Patient DOB: 17-Apr-1966 Provider: Henreitta Leber, MD  Identifying Statement:  [name] is a 58 y.o. 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)  11/21/2020 Surgery   Left temporal craniotomy, resection with Dr. Maurice Small.  Path is glioblastoma, IDHwt   12/27/2020 - 02/07/2021 Radiation Therapy   IMRT and concurrent Temodar Basilio [place])   03/12/2021 - 08/26/2021 Chemotherapy   Completes 5 cycles of adjuvant 5-day Temodar   08/27/2021 Progression   POD #1   09/06/2021 -  Chemotherapy   Initiates second line therapy; CCNU 90mg /m2 PO q6 weeks and avastin 10mg /kg IV q2 weeks   12/28/2021 Progression   POD #2   01/17/2022 -  Chemotherapy   Third line therapy with Irinotecan and Avastin q2 weeks   11/21/2022 -  Chemotherapy   Transitions to single agent Avastin   05/21/2023 Progression   POD #3   06/18/2023 - 07/01/2023 Radiation Therapy   Re-irradiation 35/10 with concurrent avastin 10mg /kg IV q2 weeks Basilio [place])     Biomarkers:  MGMT Methylated.  IDH 1/2 Wild type.  EGFR Unknown  TERT Unknown   Interval History: [name] presents today for avastin, now having initiated re-irradiation as of yesterday, 06/18/23.  So far radiation has been uncomplicated, no issues.  She did have a seizure event while at church this past Sunday; per usual semiology it took ~1 day to return to baseline.  She has not yet increased the vimpat to 200mg  twice per day as instructed.      H+P (11/25/20) Patient presented to medical attention this past month with first ever seizure.  She describes episode of sudden onset "disconnection" with tongue biting and urination, followed by period of  confusion.  CNS imaging demonstrated enhancing mass within left anterior temporal lobe, which was mostly resected by Dr. Maurice Small on 11/21/20.  Since surgery she had not had recurrence of seizures.  She complains of painful cramping of her right leg, which started ~1 week prior.  At this time the cramping is her main complaint.  Otherwise fully functional, independent, would like to return to work if possible.  Medications: Current Outpatient Medications on File Prior to Visit  Medication Sig Dispense Refill   amLODipine (NORVASC) 10 MG tablet TAKE 1 TABLET BY MOUTH EVERY DAY 90 tablet 1   dexamethasone (DECADRON) 2 MG tablet Take 1 tablet (2 mg total) by mouth daily. 30 tablet 1   HYDROcodone bit-homatropine (HYCODAN) 5-1.5 MG/5ML syrup Take 5 mLs by mouth every 8 (eight) hours as needed for cough. 120 mL 0   Lacosamide 100 MG TABS Take 2 tablets (200 mg total) by mouth 2 (two) times daily. 120 tablet 2   levETIRAcetam (KEPPRA) 750 MG tablet TAKE 2 TABLETS (1,500 MG TOTAL) BY MOUTH 2 (TWO) TIMES DAILY. 360 tablet 1   levothyroxine (SYNTHROID) 100 MCG tablet Take 1 tablet (100 mcg total) by mouth daily. 90 tablet 3   LORazepam (ATIVAN) 2 MG tablet TAKE 1 TABLET (2 MG TOTAL) BY MOUTH EVERY 8 (EIGHT) HOURS AS NEEDED FOR SEIZURE. 12 tablet 0   losartan (COZAAR) 50 MG tablet TAKE 1 TABLET BY MOUTH EVERY DAY 90 tablet 1   ondansetron (ZOFRAN-ODT) 4 MG disintegrating tablet Take 1 tablet (4 mg  total) by mouth every 8 (eight) hours as needed for nausea or vomiting. 10 tablet 0   Potassium Chloride ER 20 MEQ TBCR Take 1 tablet (20 mEq total) by mouth daily. 90 tablet 3   prochlorperazine (COMPAZINE) 10 MG tablet Take 10 mg by mouth every 6 (six) hours as needed for nausea or vomiting.     Current Facility-Administered Medications on File Prior to Visit  Medication Dose Route Frequency Provider Last Rate Last Admin   0.9 %  sodium chloride infusion   Intravenous Continuous Rushie Brazel, Georgeanna Lea, MD        bevacizumab-awwb (MVASI) 800 mg in sodium chloride 0.9 % 100 mL chemo infusion  10 mg/kg (Treatment Plan Recorded) Intravenous Once Henreitta Leber, MD        Allergies: No Known Allergies Past Medical History:  Past Medical History:  Diagnosis Date   Bell's palsy    left side of face- notied in smile and eyes, if very tired   Brain cancer ([name])    Complication of anesthesia    N/V   Essential hypertension    External hemorrhoids    History of blood transfusion    post op Hemorroids   History of pneumonia    Hyperlipidemia    Hyperthyroidism    hx 58 years old- oral meds   Iron deficiency anemia    Migraine headache    Pneumonia    x 2 - years ago   PONV (postoperative nausea and vomiting)    PPD positive    Pulmonary nodules    not seen on plain cxr, first detected 1/07 re ct 10/08 pos IPPD > 15 mm 12/09 FOB/lavage 04/20/08 neg afb smear   PVC's (premature ventricular contractions)    Seizure ([name]) 11/15/2020   Sleep apnea    refused CPAP- mouth piece recommended   Tuberculosis    in her late 30's   Past Surgical History:  Past Surgical History:  Procedure Laterality Date   ABDOMINAL HYSTERECTOMY     APPLICATION OF CRANIAL NAVIGATION N/A 11/21/2020   Procedure: APPLICATION OF CRANIAL NAVIGATION;  Surgeon: Jadene Pierini, MD;  Location: [facility] OR;  Service: Neurosurgery;  Laterality: N/A;   BREAST BIOPSY Right 2014   BREAST BIOPSY Left 10/29/2019   CHOLECYSTECTOMY  2014   COLONOSCOPY  05/04/2009   prolapsing external hemorrhoids   CRANIOTOMY Left 11/21/2020   Procedure: Left craniotomy for tumor resection;  Surgeon: Jadene Pierini, MD;  Location: [facility] OR;  Service: Neurosurgery;  Laterality: Left;   ENDOMETRIAL ABLATION     HEMORRHOID SURGERY     x 3   TUBAL LIGATION     Social History:  Social History   Socioeconomic History   Marital status: Divorced    Spouse name: Not on file   Number of children: 2   Years of education: Not on file   Highest  education level: Associate degree: occupational, Scientist, product/process development, or vocational program  Occupational History   Occupation: accounts IT trainer: [employer]  Tobacco Use   Smoking status: Never   Smokeless tobacco: Never  Vaping Use   Vaping status: Never Used  Substance and Sexual Activity   Alcohol use: No    Alcohol/week: 0.0 standard drinks of alcohol   Drug use: No   Sexual activity: Not on file  Other Topics Concern   Not on file  Social History Narrative   Divorced with 2 children   She works as a Merchandiser, retail in Audiological scientist  Possible TB exposure 2008 (after nodules found)   From [place] lived there and [place]   Never owned birds      Social Drivers of Health   Financial Resource Strain: Medium Risk (06/13/2021)   Overall Financial Resource Strain (CARDIA)    Difficulty of Paying Living Expenses: Somewhat hard  Food Insecurity: Food Insecurity Present (06/03/2023)   Hunger Vital Sign    Worried About Running Out of Food in the Last Year: Sometimes true    Ran Out of Food in the Last Year: Sometimes true  Transportation Needs: No Transportation Needs (06/03/2023)   PRAPARE - Administrator, Civil Service (Medical): No    Lack of Transportation (Non-Medical): No  Physical Activity: Unknown (04/23/2021)   Exercise Vital Sign    Days of Exercise per Week: 0 days    Minutes of Exercise per Session: Not on file  Stress: No Stress Concern Present (04/23/2021)   [employer] of Occupational Health - Occupational Stress Questionnaire    Feeling of Stress : Only a little  Social Connections: Unknown (09/16/2021)   Received from [facility]   Social Network    Social Network: Not on file  Intimate Partner Violence: Not At Risk (06/03/2023)   Humiliation, Afraid, Rape, and Kick questionnaire    Fear of Current or Ex-Partner: No    Emotionally Abused: No    Physically Abused: No    Sexually Abused: No   Family History:  Family History  Problem  Relation Age of Onset   Diabetes type II Mother    Kidney failure Sister    Diabetes Mellitus II Sister    Cancer Maternal Aunt        Stomach cancer   Colon cancer Maternal Aunt        aunts and uncles   Diabetes Mellitus II Maternal Aunt    Pancreatic cancer Maternal Aunt    Colon cancer Maternal Uncle    Diabetes Mellitus II Maternal Uncle    Heart disease Maternal Grandmother        aunts, uncles, sister   Diabetes Mellitus II Maternal Grandmother    Lung cancer Other        aunts and uncles    Review of Systems: Constitutional: poor appetite Eyes: blurriness of vision Ears, nose, mouth, throat, and face: Doesn't report sore throat Respiratory: Doesn't report cough, dyspnea or wheezes Cardiovascular: Doesn't report palpitation, chest discomfort  Gastrointestinal:  Doesn't report nausea, constipation, diarrhea GU: Doesn't report incontinence Skin: Doesn't report skin rashes Neurological: Per HPI Musculoskeletal: bilateral shoulder pain Behavioral/Psych: +anxiety  Physical Exam: Vitals:   06/19/23 0937  BP: (!) 157/90  Pulse: 73  Resp: 16  Temp: 97.6 F (36.4 C)  SpO2: 100%     KPS: 70. General: Alert, cooperative, pleasant, in no acute distress Head: Normal EENT: No conjunctival injection or scleral icterus.  Lungs: Resp effort normal Cardiac: Regular rate Abdomen: Non-distended abdomen Skin: No rashes cyanosis or petechiae. Extremities: No clubbing or edema  Neurologic Exam: Mental Status: Awake, alert, attentive to examiner. Oriented to self and environment. Language is notable for modest impairment in fluency.  Impaired short term recall. Cranial Nerves: Visual acuity is grossly normal. Visual fields are full. Extra-ocular movements intact. No ptosis. Face L LMN paresis, chronic. Motor: Tone and bulk are normal. Power is full in both arms and legs. Reflexes are symmetric, no pathologic reflexes present.  Sensory: Intact to light touch Gait:  Dystaxic  Labs: I have reviewed  the data as listed    Component Value Date/Time   NA 137 03/27/2023 1211   K 3.7 03/27/2023 1211   CL 103 03/27/2023 1211   CO2 26 03/27/2023 1211   GLUCOSE 129 (H) 03/27/2023 1211   BUN 11 03/27/2023 1211   CREATININE 1.05 (H) 03/27/2023 1211   CREATININE 0.92 01/25/2020 1424   CALCIUM 10.1 03/27/2023 1211   PROT 8.0 03/27/2023 1211   ALBUMIN 4.5 03/27/2023 1211   AST 17 03/27/2023 1211   ALT 15 03/27/2023 1211   ALKPHOS 141 (H) 03/27/2023 1211   BILITOT 1.6 (H) 03/27/2023 1211   GFRNONAA >60 03/27/2023 1211   GFRNONAA 72 01/25/2020 1424   GFRAA 83 01/25/2020 1424   Lab Results  Component Value Date   WBC 6.9 06/19/2023   NEUTROABS 5.2 06/19/2023   HGB 15.2 (H) 06/19/2023   HCT 46.3 (H) 06/19/2023   MCV 96.3 06/19/2023   PLT 165 06/19/2023     Assessment/Plan 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)  Focal seizures ([name])  [name] is clinically stable today, now having initiated re-irradiation for progressive glioblastoma.  No further breakthrough seizures after unprovoked event last week.     Patient elected to proceed with avastin 10mg /kg IV 2q weeks.  Avastin will be helpful as concurrent therapy given burden of enhancing tumor and steroid requirement.  We reviewed side effects of avastin, including hypertension, bleeding/clotting events, wound healing impairment.  The patient will have a complete blood count, a comprehensive metabolic panel, and urine protein performed prior to each avastin infusion. Labs may need to be performed more often.    Informed consent was obtained verbally at bedside to proceed with avastin.   Avastin should be held for the following:  ANC less than 500  Platelets less than 50,000  LFT or creatinine greater than 2x ULN  If clinical concerns/contraindications develop  Decadron currently at 2mg  daily.  For seizures, will con't Vimpat 200mg  BID, increased from 100mg   BID.  Keppra will also continue 1000mg  BID.  We ask that [name] return to clinic following goals of care decision.  All questions were answered. The patient knows to call the clinic with any problems, questions or concerns. No barriers to learning were detected.  The total time spent in the encounter was 40 minutes and more than 50% was on counseling and review of test results   Henreitta Leber, MD Medical Director of Neuro-Oncology [facility] 06/19/23 10:42 AM

## 2023-06-20 ENCOUNTER — Ambulatory Visit
Admission: RE | Admit: 2023-06-20 | Discharge: 2023-06-20 | Disposition: A | Discharge status: Home / Self Care | Payer: BC Managed Care – PPO | Source: Ambulatory Visit | Attending: Radiation Oncology | Admitting: Radiation Oncology

## 2023-06-20 ENCOUNTER — Other Ambulatory Visit: Payer: Self-pay

## 2023-06-20 DIAGNOSIS — C712 Malignant neoplasm of temporal lobe: Secondary | ICD-10-CM | POA: Diagnosis not present

## 2023-06-20 DIAGNOSIS — Z5111 Encounter for antineoplastic chemotherapy: Secondary | ICD-10-CM | POA: Diagnosis not present

## 2023-06-20 DIAGNOSIS — Z79899 Other long term (current) drug therapy: Secondary | ICD-10-CM | POA: Diagnosis not present

## 2023-06-20 DIAGNOSIS — Z510 Encounter for antineoplastic radiation therapy: Secondary | ICD-10-CM | POA: Diagnosis not present

## 2023-06-20 LAB — RAD ONC ARIA SESSION SUMMARY
Course Elapsed Days: 2
Plan Fractions Treated to Date: 3
Plan Prescribed Dose Per Fraction: 3.5 Gy
Plan Total Fractions Prescribed: 10
Plan Total Prescribed Dose: 35 Gy
Reference Point Dosage Given to Date: 10.5 Gy
Reference Point Session Dosage Given: 3.5 Gy
Session Number: 3

## 2023-06-21 IMAGING — CT CT CHEST-ABD-PELV W/O CM
2 of 5 series · 14 of 46 positions shown, 16 images · non-contrast
Comparison: November 23, 2019 and June 22, 2012

CLINICAL DATA: Cancer of unknown primary.

EXAM:
CT CHEST, ABDOMEN AND PELVIS WITHOUT CONTRAST
TECHNIQUE: Multidetector CT imaging of the chest, abdomen and pelvis was
performed following the standard protocol without IV contrast.

[Series 5: cap w/o 3.0 mm st cor · coronal · non-contrast · 0.87mm/px · 3 of 102 slices shown]
[im 34/102  soft-tissue]
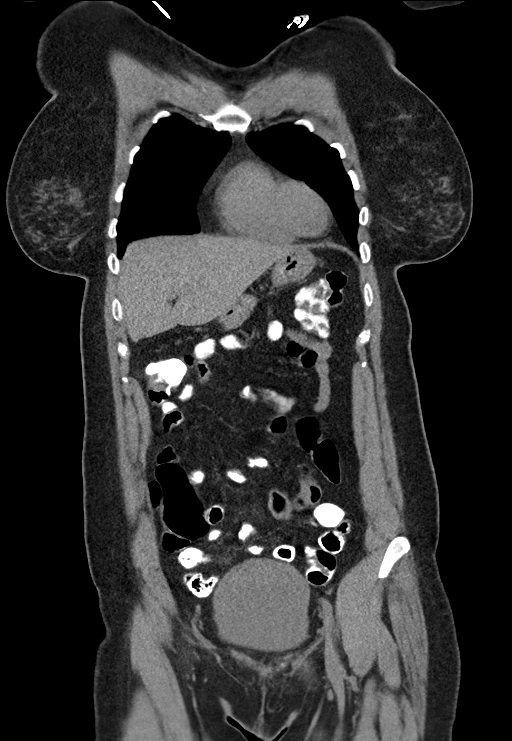
[im 45/102  soft-tissue]
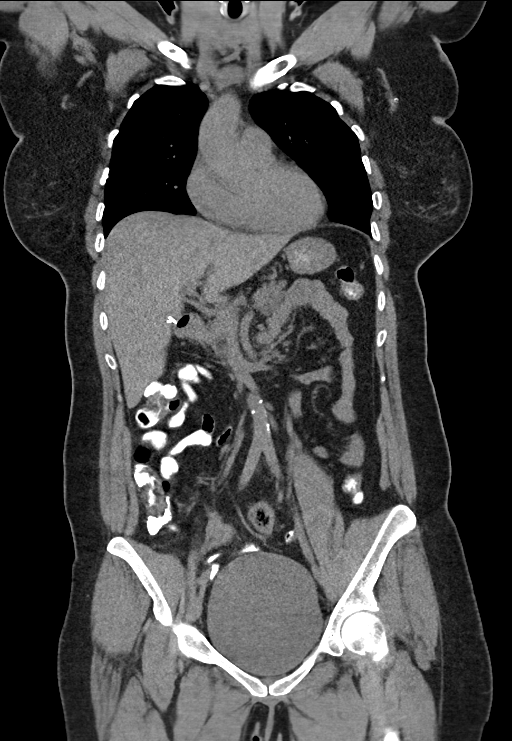
[im 57/102  soft-tissue]
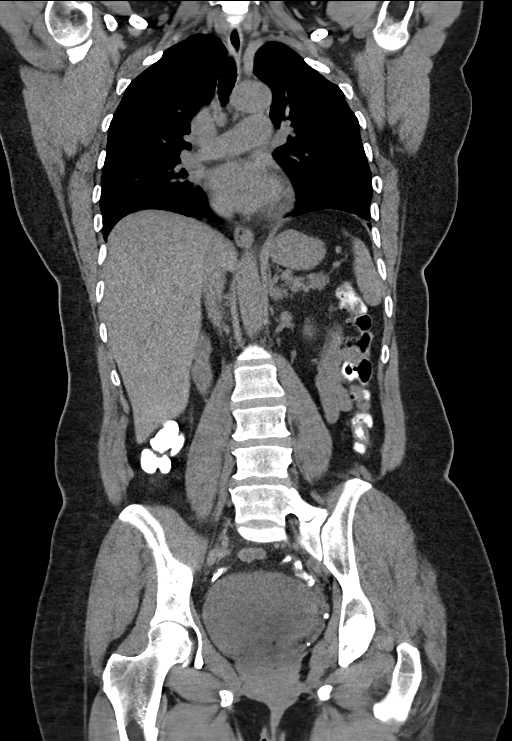

[Series 7: cap w/o 2.0 mm st · axial · non-contrast · 0.94mm/px · z∈[+432,+998]mm · 11 of 321 slices shown, 13 images]
[im 19/321  soft-tissue]
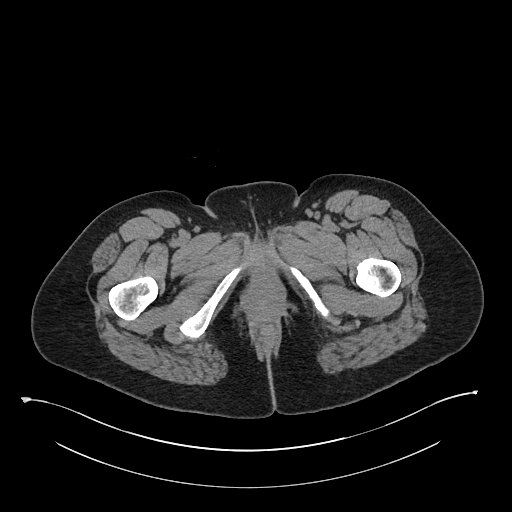
[im 19/321  bone]
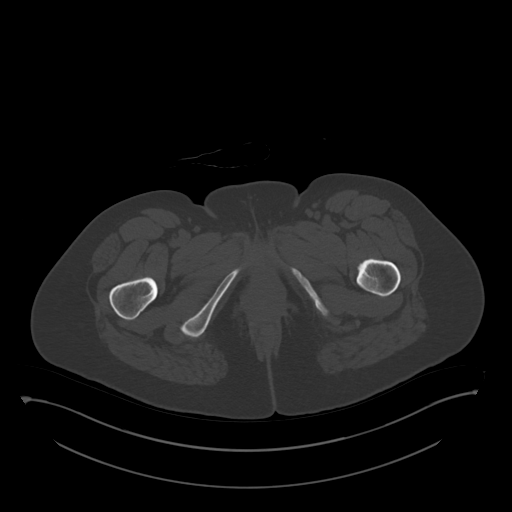
[im 57/321  soft-tissue]
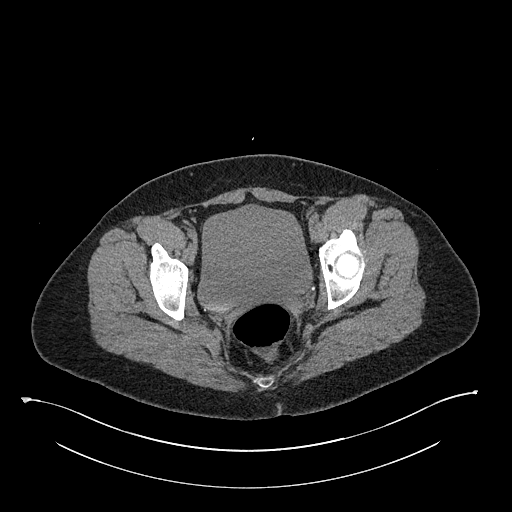
[im 76/321  soft-tissue]
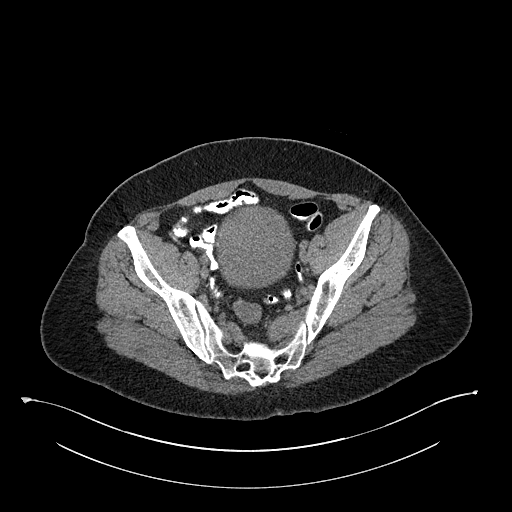
[im 113/321  soft-tissue]
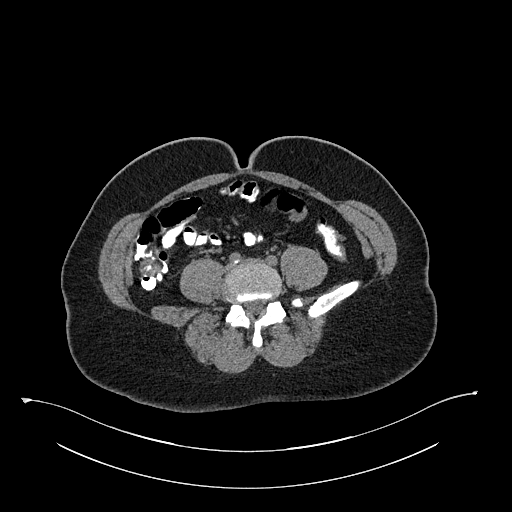
[im 132/321  soft-tissue]
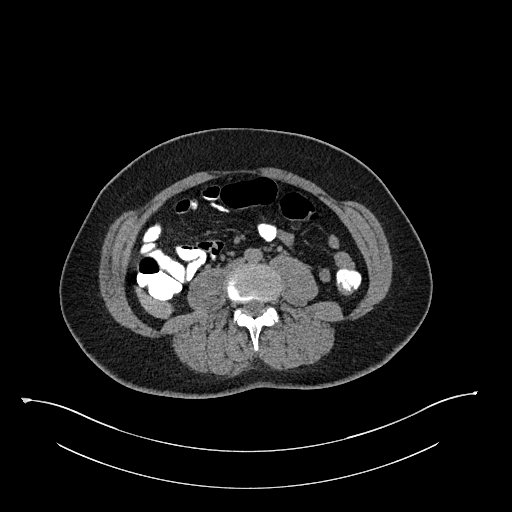
[im 170/321  soft-tissue]
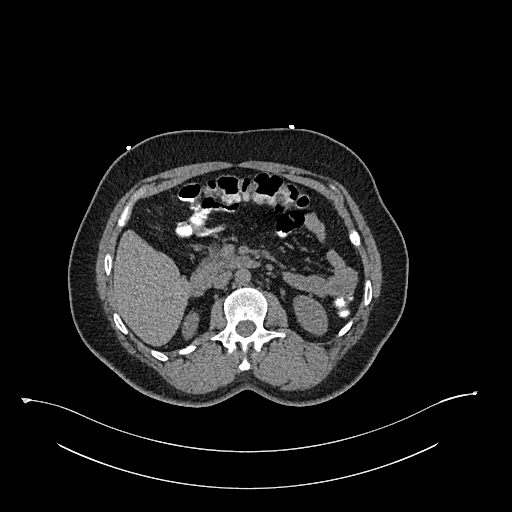
[im 189/321  soft-tissue]
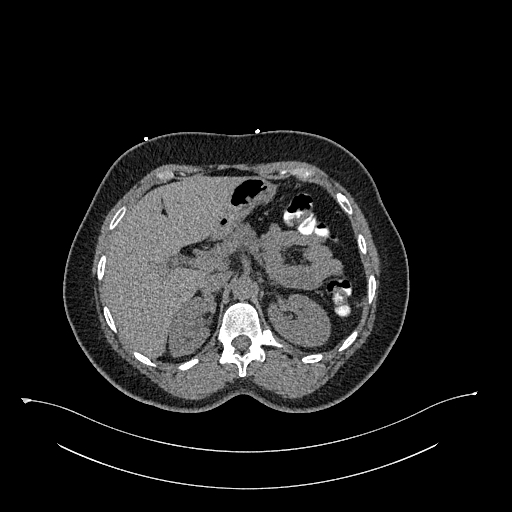
[im 208/321  soft-tissue]
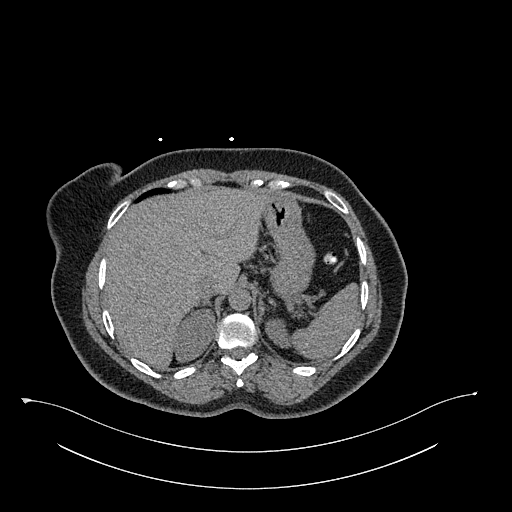
[im 245/321  soft-tissue]
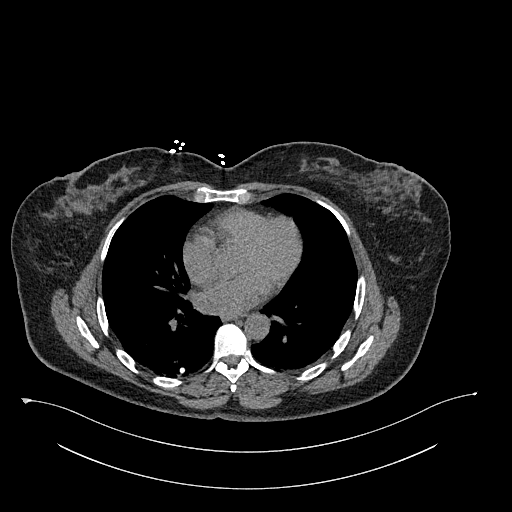
[im 245/321  bone]
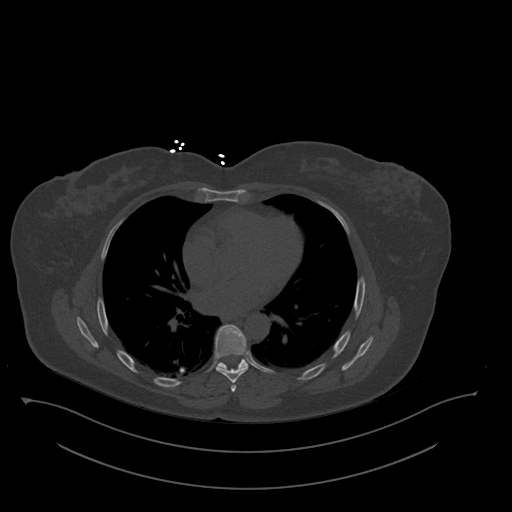
[im 264/321  soft-tissue]
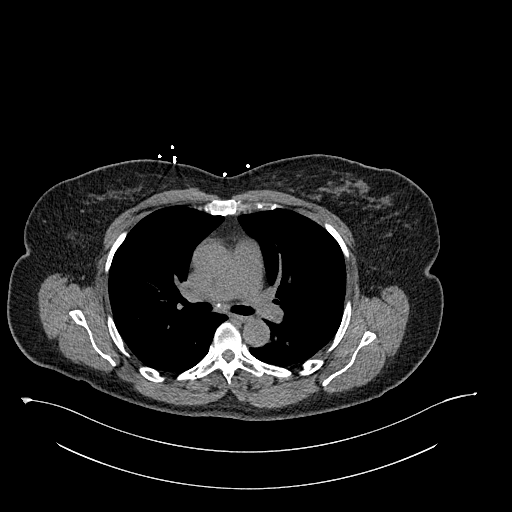
[im 302/321  soft-tissue]
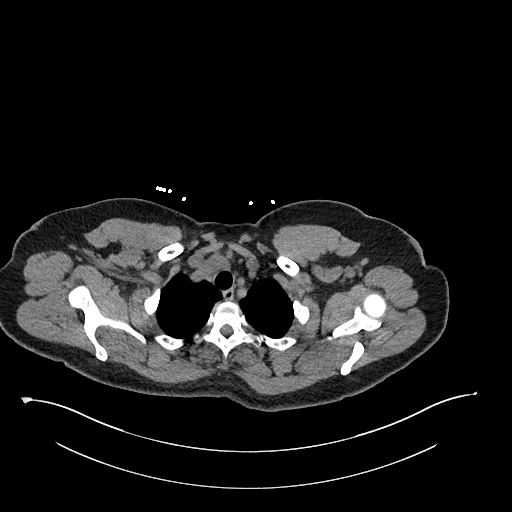

[14 of 46 positions shown; findings below may reference images not displayed]

FINDINGS: CT CHEST FINDINGS

Cardiovascular: No thoracic aortic aneurysm. Normal caliber central
pulmonary arteries. Normal size heart. No significant pericardial
effusion/thickening.

Mediastinum/Nodes: No pathologically enlarged supraclavicular nodes.
Apparent nodular enlargement the thyroid without discrete
nodularity. No pathologically enlarged mediastinal, hilar or
axillary lymph nodes, noting limited sensitivity for the detection
of hilar adenopathy on this noncontrast study. The trachea esophagus
are grossly unremarkable.

Lungs/Pleura: Stable small right upper lobe pulmonary nodules,
measuring up to 4 mm on image 48/4. Stable 4 mm subpleural right
lower lobe pulmonary nodule on image 75/4. Stable 4 mm right middle
lobe pulmonary nodule image 104/4. No new suspicious pulmonary
nodules or masses. Right lower lobe calcified granuloma image 78/4.
No pleural effusion or pneumothorax. Mild paraseptal emphysematous
change. Stable mild biapical pleuroparenchymal scarring. Bibasilar
atelectasis.

Musculoskeletal: No chest wall mass or suspicious bone lesions
identified.

CT ABDOMEN PELVIS FINDINGS

Hepatobiliary: Hepatomegaly measuring 20.2 cm in maximum
craniocaudal dimension. No discrete hepatic lesion visualized.
Gallbladder is surgically absent. No biliary ductal dilation.

Pancreas: Hypodense 1.6 x 1.5 cm area in the pancreatic
head/uncinate process on image 60/3. No pancreatic ductal dilation.

Spleen: Within normal limits.

Adrenals/Urinary Tract: Adrenal glands are unremarkable. Kidneys are
normal, without renal calculi, contour deforming lesion, or
hydronephrosis. Bladder is unremarkable.

Stomach/Bowel: Radiopaque enteric contrast traverses the descending
colon. Stomach is grossly unremarkable for degree of distension.
Pathologic dilation of small. The appendix and terminal are grossly.
Portions of the sigmoid colon and rectum decompressed limiting
evaluation.

Vascular/Lymphatic: Aortic atherosclerosis without aneurysmal
dilation. No pathologically enlarged abdominal or pelvic lymph
nodes.

Reproductive: Status post hysterectomy. No adnexal masses.

Other: Abdominopelvic ascites.

Musculoskeletal: Transitional anatomy with sacralization of the left
L5 vertebral body. No aggressive lytic or blastic lesion of bone.
IMPRESSION: 1. Hypodense 1.6 x 1.5 cm area in the pancreatic head/uncinate
process, incompletely evaluated without intravenous contrast
material and possibly represent fatty infiltration, cystic
pancreatic lesion, pseudocyst or primary pancreatic neoplasm. No
pancreatic ductal dilation. Further evaluation with MRI of the
abdomen with and without contrast is suggested.
2. Apparent nodular enlargement of the thyroid, without discrete
thyroid nodule visualized. Nonspecific and possibly within normal
limits. However, this could be further evaluated dedicated thyroid
ultrasound.
3. Hepatomegaly, of indeterminate clinical significance. No discrete
focal lesion visualized on this noncontrast examination.
4. Small pulmonary nodules stable since 1809, favor benign.
5. Aortic Atherosclerosis (4AGUY-GRT.T) and Emphysema (4AGUY-D4Q.7).

## 2023-06-21 IMAGING — MR MR HEAD WO/W CM
14 of 17 series · 30 of 48 positions shown · IV contrast (gadavist)
Comparison: None.

CLINICAL DATA: Abnormal CT

EXAM:
MRI HEAD WITHOUT AND WITH CONTRAST
TECHNIQUE: Multiplanar, multiecho pulse sequences of the brain and surrounding
structures were obtained without and with intravenous contrast.
CONTRAST:  8mL GADAVIST GADOBUTROL 1 MMOL/ML IV SOLN

[Series 3: DWI · axial · 3.0mm · 1.09mm/px · z∈[-89,+60]mm · 6 of 102 slices shown (1 of 4)]
[im 1/102]
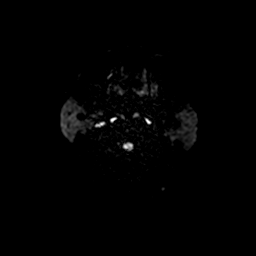
[im 21/102]
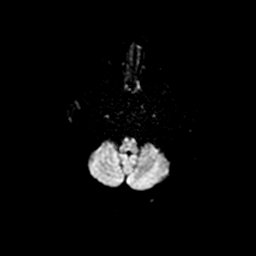
[im 41/102]
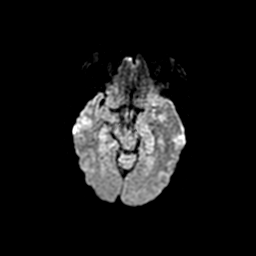
[im 61/102]
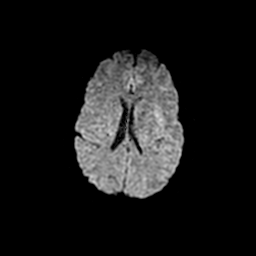
[im 81/102]
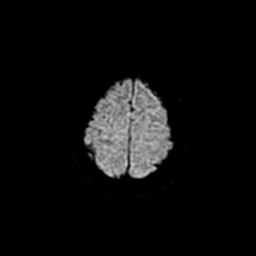
[im 102/102]
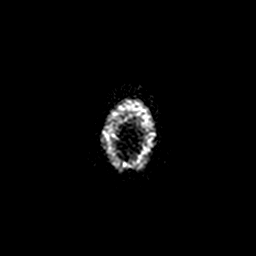

[Series 4: DWI · coronal · 5.0mm · 1.09mm/px · 5 of 80 slices shown (2 of 4)]
[im 1/80]
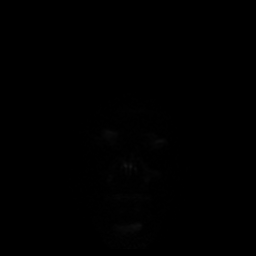
[im 20/80]
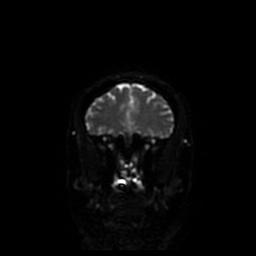
[im 40/80]
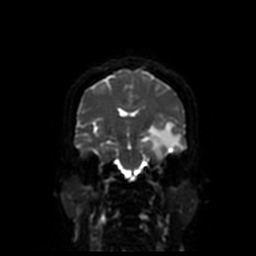
[im 60/80]
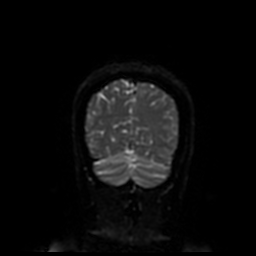
[im 80/80]
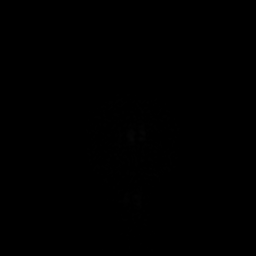

[Series 5: T1 · sagittal · 5.0mm · 0.47mm/px · 2 of 25 slices shown (1 of 2)]
[im 1/25]
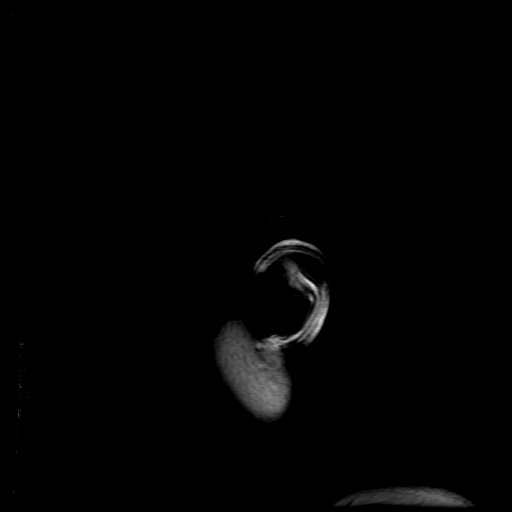
[im 25/25]
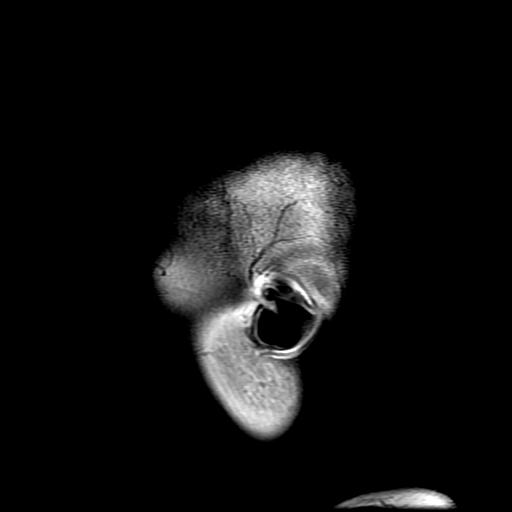

[Series 6: T2 · axial · 5.0mm · 0.43mm/px · z∈[-85,+65]mm · 2 of 26 slices shown (1 of 2)]
[im 1/26]
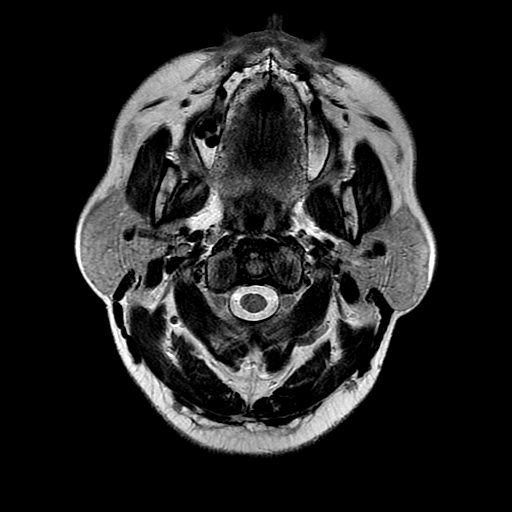
[im 26/26]
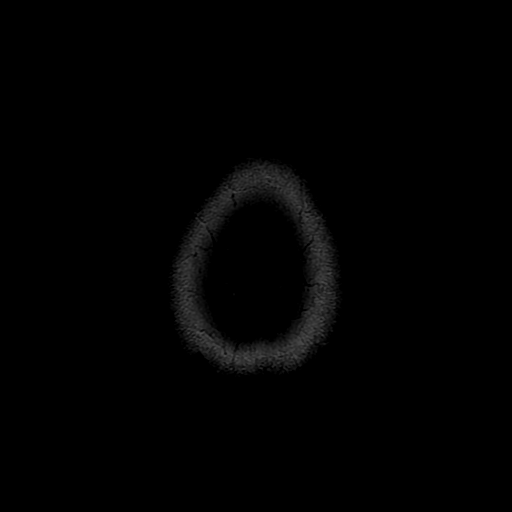

[Series 7: FLAIR · axial · 3.0mm · 0.43mm/px · 1 of 26 slices shown (1 of 2)]
[im 1/26]
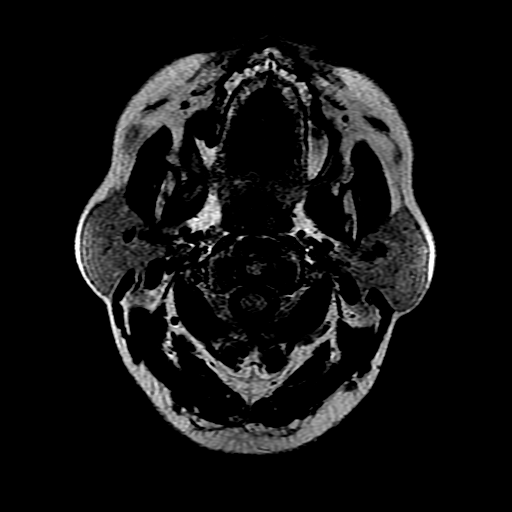

[Series 9: T1 · axial · 3.0mm · 0.43mm/px · 1 of 104 slices shown (2 of 2)]
[im 1/104]
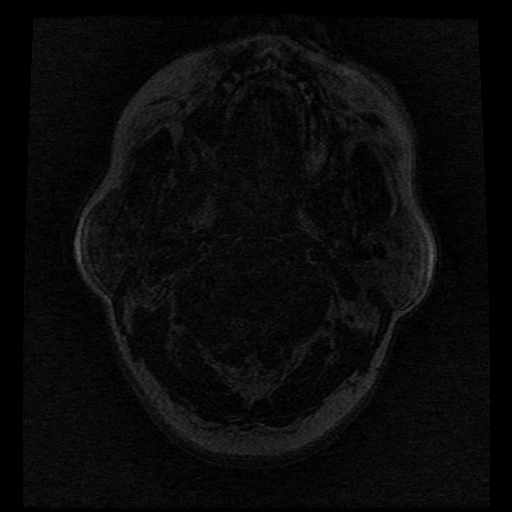

[Series 11: T2 post-contrast · coronal · 5.0mm · 0.39mm/px · 1 of 24 slices shown]
[im 1/24]
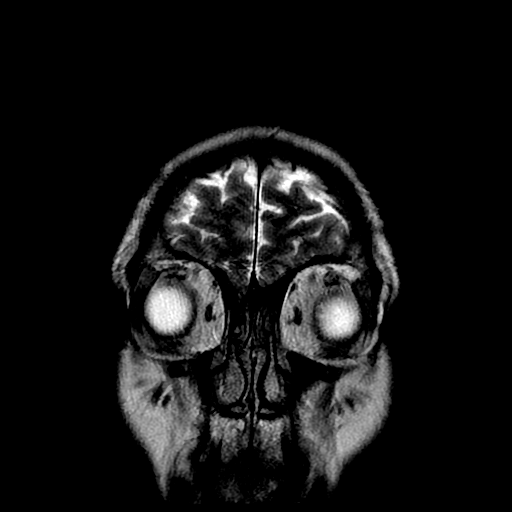

[Series 12: T1 post-contrast · axial · 3.0mm · 0.43mm/px · z∈[-86,+67]mm · 3 of 52 slices shown (1 of 3)]
[im 1/52]
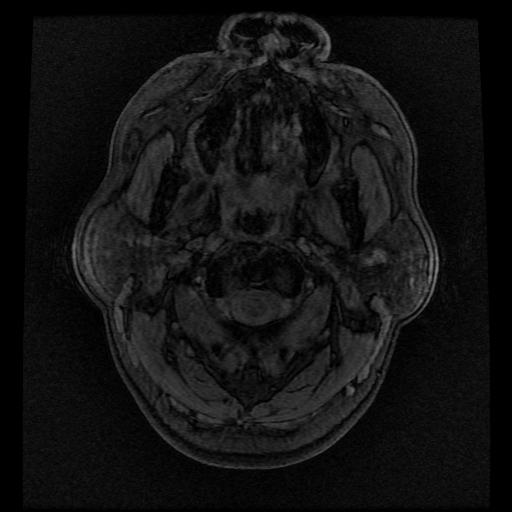
[im 26/52]
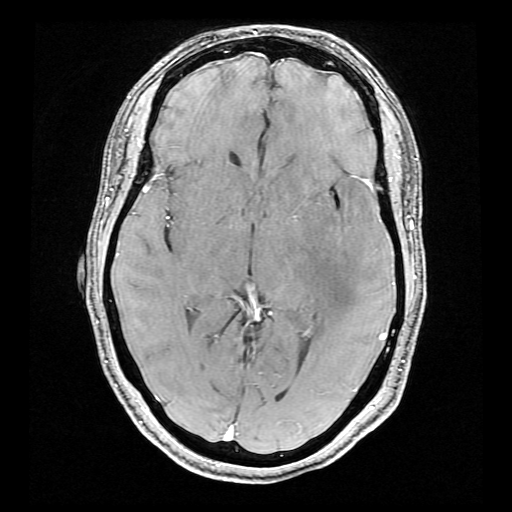
[im 52/52]
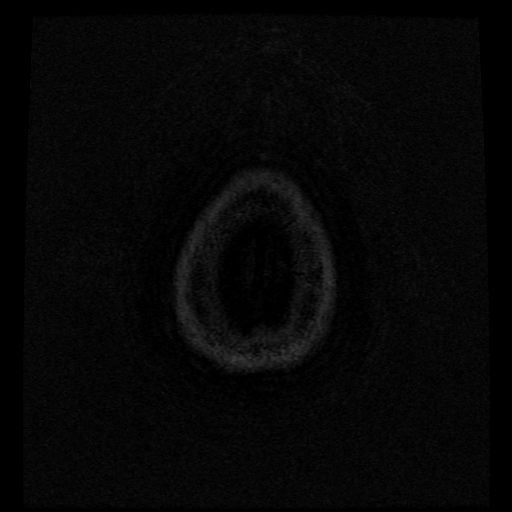

[Series 13: T1 post-contrast · coronal · 5.0mm · 0.39mm/px · 1 of 24 slices shown (2 of 3)]
[im 1/24]
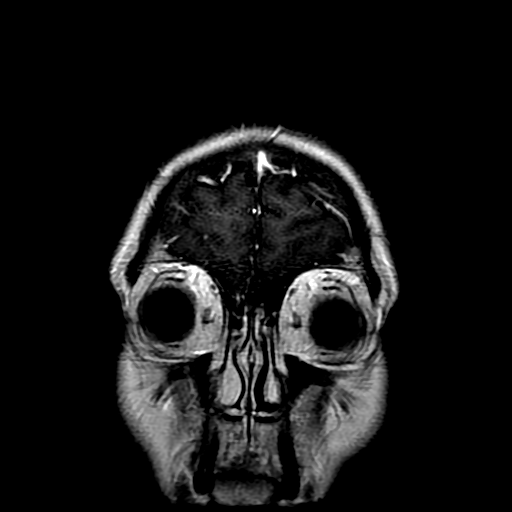

[Series 14: T1 post-contrast · sagittal · 5.0mm · 0.47mm/px · 1 of 25 slices shown (3 of 3)]
[im 1/25]
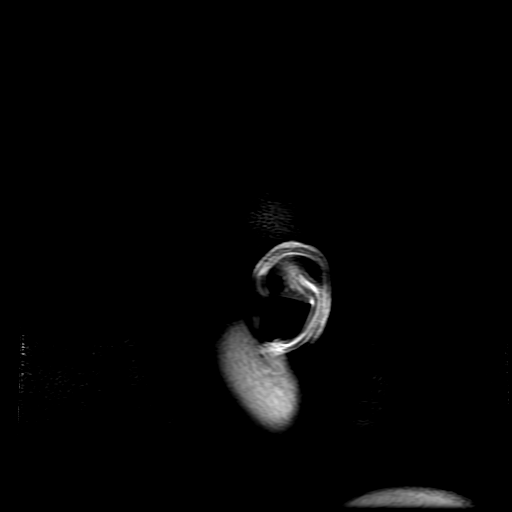

[Series 16: T2 · coronal · 3.0mm · 0.35mm/px · 1 of 27 slices shown (2 of 2)]
[im 1/27]
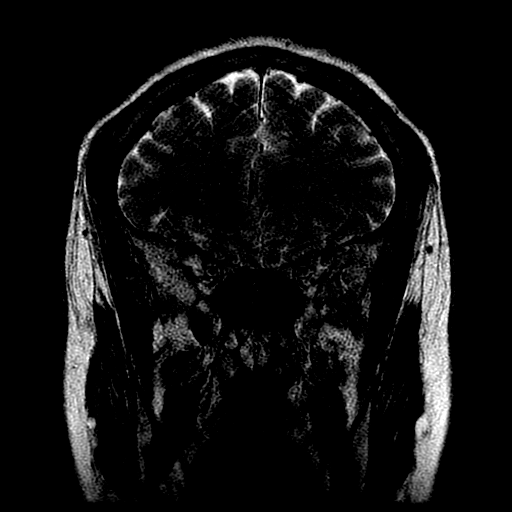

[Series 17: FLAIR · coronal · 3.0mm · 0.35mm/px · 1 of 27 slices shown (2 of 2)]
[im 1/27]
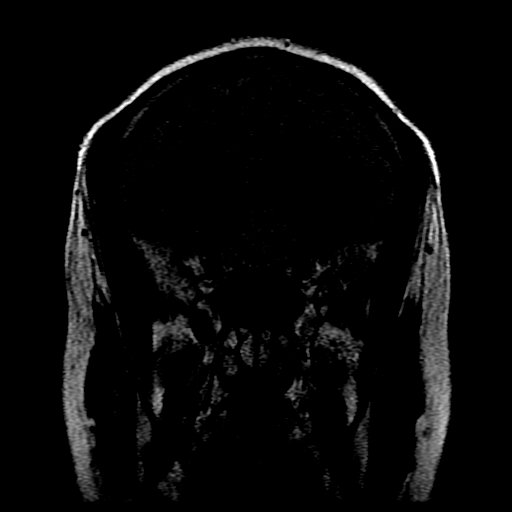

[Series 300: DWI · axial · 3.0mm · 1.09mm/px · z∈[-89,+60]mm · 3 of 51 slices shown (3 of 4)]
[im 1/51]
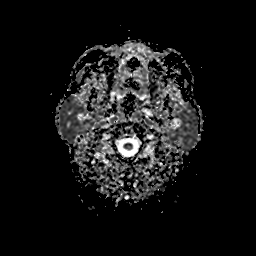
[im 26/51]
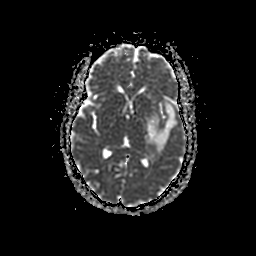
[im 51/51]
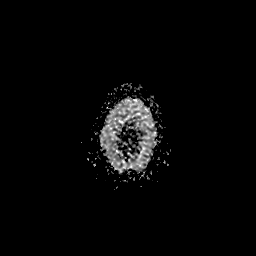

[Series 400: DWI · coronal · 5.0mm · 1.09mm/px · 2 of 40 slices shown (4 of 4)]
[im 1/40]
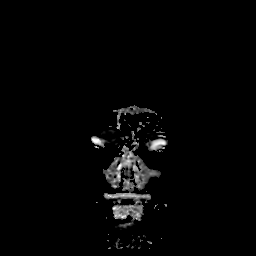
[im 40/40]
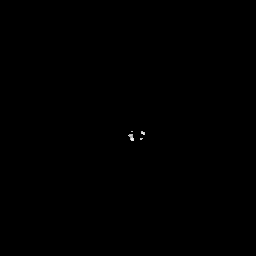

[30 of 48 positions shown; findings below may reference images not displayed]

FINDINGS: Brain: There is a heterogeneously enhancing, likely centrally
necrotic lesion of the anterior left temporal lobe measuring
approximately 3.5 x 3.1 x 3.4 cm. Associated susceptibility likely
reflects intralesional hemorrhage. There is surrounding T2 FLAIR
hyperintensity extending posteriorly and superiorly about the insula
and to the periatrial white matter. There is some mildly reduced
diffusion associated with the superior and posterior portion of this
T2 hyperintensity.

Additional nonspecific patchy foci of T2 hyperintensity in the
supratentorial white matter may reflect gliosis or demyelination.

Mass effect related to above is mild. No evidence of acute
infarction. No hydrocephalus or extra-axial collection.

Vascular: Major vessel flow voids at the skull base are preserved.

Skull and upper cervical spine: Normal marrow signal is preserved.

Sinuses/Orbits: Minor mucosal thickening.  Orbits are unremarkable.

Other: Sella is unremarkable.  Mastoid air cells are clear.
IMPRESSION: Necrotic left temporal lobe mass with intralesional hemorrhage.
Surrounding edema and/or infiltrating tumor. Mild regional mass
effect. Differential considerations are metastatic lesion and
high-grade glioma.

## 2023-06-23 ENCOUNTER — Ambulatory Visit
Admission: RE | Admit: 2023-06-23 | Discharge: 2023-06-23 | Disposition: A | Discharge status: Home / Self Care | Payer: BC Managed Care – PPO | Source: Ambulatory Visit | Attending: Radiation Oncology | Admitting: Radiation Oncology

## 2023-06-23 ENCOUNTER — Other Ambulatory Visit: Payer: Self-pay

## 2023-06-23 DIAGNOSIS — Z79899 Other long term (current) drug therapy: Secondary | ICD-10-CM | POA: Diagnosis not present

## 2023-06-23 DIAGNOSIS — C712 Malignant neoplasm of temporal lobe: Secondary | ICD-10-CM | POA: Diagnosis not present

## 2023-06-23 DIAGNOSIS — Z5111 Encounter for antineoplastic chemotherapy: Secondary | ICD-10-CM | POA: Diagnosis not present

## 2023-06-23 DIAGNOSIS — Z510 Encounter for antineoplastic radiation therapy: Secondary | ICD-10-CM | POA: Diagnosis not present

## 2023-06-23 LAB — RAD ONC ARIA SESSION SUMMARY
Course Elapsed Days: 5
Plan Fractions Treated to Date: 4
Plan Prescribed Dose Per Fraction: 3.5 Gy
Plan Total Fractions Prescribed: 10
Plan Total Prescribed Dose: 35 Gy
Reference Point Dosage Given to Date: 14 Gy
Reference Point Session Dosage Given: 0.2649 Gy
Session Number: 4

## 2023-06-24 ENCOUNTER — Ambulatory Visit
Admission: RE | Admit: 2023-06-24 | Discharge: 2023-06-24 | Disposition: A | Discharge status: Home / Self Care | Payer: BC Managed Care – PPO | Source: Ambulatory Visit | Attending: Radiation Oncology | Admitting: Radiation Oncology

## 2023-06-24 ENCOUNTER — Other Ambulatory Visit: Payer: Self-pay

## 2023-06-24 DIAGNOSIS — C712 Malignant neoplasm of temporal lobe: Secondary | ICD-10-CM | POA: Diagnosis not present

## 2023-06-24 DIAGNOSIS — Z510 Encounter for antineoplastic radiation therapy: Secondary | ICD-10-CM | POA: Diagnosis not present

## 2023-06-24 DIAGNOSIS — Z5111 Encounter for antineoplastic chemotherapy: Secondary | ICD-10-CM | POA: Diagnosis not present

## 2023-06-24 DIAGNOSIS — Z79899 Other long term (current) drug therapy: Secondary | ICD-10-CM | POA: Diagnosis not present

## 2023-06-24 LAB — RAD ONC ARIA SESSION SUMMARY
Course Elapsed Days: 6
Plan Fractions Treated to Date: 5
Plan Prescribed Dose Per Fraction: 3.5 Gy
Plan Total Fractions Prescribed: 10
Plan Total Prescribed Dose: 35 Gy
Reference Point Dosage Given to Date: 17.5 Gy
Reference Point Session Dosage Given: 3.5 Gy
Session Number: 5

## 2023-06-25 ENCOUNTER — Ambulatory Visit
Admission: RE | Admit: 2023-06-25 | Discharge: 2023-06-25 | Disposition: A | Discharge status: Home / Self Care | Payer: BC Managed Care – PPO | Source: Ambulatory Visit | Attending: Radiation Oncology | Admitting: Radiation Oncology

## 2023-06-25 ENCOUNTER — Other Ambulatory Visit: Payer: Self-pay

## 2023-06-25 DIAGNOSIS — Z510 Encounter for antineoplastic radiation therapy: Secondary | ICD-10-CM | POA: Diagnosis not present

## 2023-06-25 DIAGNOSIS — Z79899 Other long term (current) drug therapy: Secondary | ICD-10-CM | POA: Diagnosis not present

## 2023-06-25 DIAGNOSIS — Z5111 Encounter for antineoplastic chemotherapy: Secondary | ICD-10-CM | POA: Diagnosis not present

## 2023-06-25 DIAGNOSIS — C712 Malignant neoplasm of temporal lobe: Secondary | ICD-10-CM | POA: Diagnosis not present

## 2023-06-25 LAB — RAD ONC ARIA SESSION SUMMARY
Course Elapsed Days: 7
Plan Fractions Treated to Date: 6
Plan Prescribed Dose Per Fraction: 3.5 Gy
Plan Total Fractions Prescribed: 10
Plan Total Prescribed Dose: 35 Gy
Reference Point Dosage Given to Date: 21 Gy
Reference Point Session Dosage Given: 3.5 Gy
Session Number: 6

## 2023-06-26 ENCOUNTER — Other Ambulatory Visit: Payer: Self-pay

## 2023-06-26 ENCOUNTER — Ambulatory Visit
Admission: RE | Admit: 2023-06-26 | Discharge: 2023-06-26 | Disposition: A | Discharge status: Home / Self Care | Payer: BC Managed Care – PPO | Source: Ambulatory Visit | Attending: Radiation Oncology | Admitting: Radiation Oncology

## 2023-06-26 DIAGNOSIS — Z510 Encounter for antineoplastic radiation therapy: Secondary | ICD-10-CM | POA: Diagnosis not present

## 2023-06-26 DIAGNOSIS — Z5111 Encounter for antineoplastic chemotherapy: Secondary | ICD-10-CM | POA: Diagnosis not present

## 2023-06-26 DIAGNOSIS — Z79899 Other long term (current) drug therapy: Secondary | ICD-10-CM | POA: Diagnosis not present

## 2023-06-26 DIAGNOSIS — C712 Malignant neoplasm of temporal lobe: Secondary | ICD-10-CM | POA: Diagnosis not present

## 2023-06-26 LAB — RAD ONC ARIA SESSION SUMMARY
Course Elapsed Days: 8
Plan Fractions Treated to Date: 7
Plan Prescribed Dose Per Fraction: 3.5 Gy
Plan Total Fractions Prescribed: 10
Plan Total Prescribed Dose: 35 Gy
Reference Point Dosage Given to Date: 24.5 Gy
Reference Point Session Dosage Given: 3.5 Gy
Session Number: 7

## 2023-06-27 ENCOUNTER — Other Ambulatory Visit: Payer: Self-pay

## 2023-06-27 ENCOUNTER — Other Ambulatory Visit: Payer: Self-pay | Admitting: Adult Health

## 2023-06-27 ENCOUNTER — Ambulatory Visit
Admission: RE | Admit: 2023-06-27 | Discharge: 2023-06-27 | Disposition: A | Discharge status: Home / Self Care | Payer: BC Managed Care – PPO | Source: Ambulatory Visit | Attending: Radiation Oncology | Admitting: Radiation Oncology

## 2023-06-27 DIAGNOSIS — Z79899 Other long term (current) drug therapy: Secondary | ICD-10-CM | POA: Diagnosis not present

## 2023-06-27 DIAGNOSIS — Z510 Encounter for antineoplastic radiation therapy: Secondary | ICD-10-CM | POA: Diagnosis not present

## 2023-06-27 DIAGNOSIS — Z5111 Encounter for antineoplastic chemotherapy: Secondary | ICD-10-CM | POA: Diagnosis not present

## 2023-06-27 DIAGNOSIS — C712 Malignant neoplasm of temporal lobe: Secondary | ICD-10-CM | POA: Diagnosis not present

## 2023-06-27 LAB — RAD ONC ARIA SESSION SUMMARY
Course Elapsed Days: 9
Plan Fractions Treated to Date: 8
Plan Prescribed Dose Per Fraction: 3.5 Gy
Plan Total Fractions Prescribed: 10
Plan Total Prescribed Dose: 35 Gy
Reference Point Dosage Given to Date: 28 Gy
Reference Point Session Dosage Given: 3.5 Gy
Session Number: 8

## 2023-06-30 ENCOUNTER — Ambulatory Visit
Admission: RE | Admit: 2023-06-30 | Discharge: 2023-06-30 | Disposition: A | Discharge status: Home / Self Care | Payer: BC Managed Care – PPO | Source: Ambulatory Visit | Attending: Radiation Oncology | Admitting: Radiation Oncology

## 2023-06-30 ENCOUNTER — Other Ambulatory Visit: Payer: Self-pay

## 2023-06-30 ENCOUNTER — Ambulatory Visit
Admission: RE | Admit: 2023-06-30 | Discharge: 2023-06-30 | Disposition: A | Discharge status: Home / Self Care | Payer: BC Managed Care – PPO | Source: Ambulatory Visit | Attending: Radiation Oncology

## 2023-06-30 DIAGNOSIS — C712 Malignant neoplasm of temporal lobe: Secondary | ICD-10-CM | POA: Diagnosis not present

## 2023-06-30 DIAGNOSIS — Z79899 Other long term (current) drug therapy: Secondary | ICD-10-CM | POA: Diagnosis not present

## 2023-06-30 DIAGNOSIS — Z5111 Encounter for antineoplastic chemotherapy: Secondary | ICD-10-CM | POA: Diagnosis not present

## 2023-06-30 DIAGNOSIS — Z510 Encounter for antineoplastic radiation therapy: Secondary | ICD-10-CM | POA: Diagnosis not present

## 2023-06-30 DIAGNOSIS — H0520 Unspecified exophthalmos: Secondary | ICD-10-CM

## 2023-06-30 DIAGNOSIS — C719 Malignant neoplasm of brain, unspecified: Secondary | ICD-10-CM

## 2023-06-30 LAB — RAD ONC ARIA SESSION SUMMARY
Course Elapsed Days: 12
Plan Fractions Treated to Date: 9
Plan Prescribed Dose Per Fraction: 3.5 Gy
Plan Total Fractions Prescribed: 10
Plan Total Prescribed Dose: 35 Gy
Reference Point Dosage Given to Date: 31.5 Gy
Reference Point Session Dosage Given: 3.5 Gy
Session Number: 9

## 2023-07-01 ENCOUNTER — Other Ambulatory Visit: Payer: Self-pay

## 2023-07-01 ENCOUNTER — Ambulatory Visit
Admission: RE | Admit: 2023-07-01 | Discharge: 2023-07-01 | Disposition: A | Discharge status: Home / Self Care | Payer: BC Managed Care – PPO | Source: Ambulatory Visit | Attending: Radiation Oncology | Admitting: Radiation Oncology

## 2023-07-01 DIAGNOSIS — Z5111 Encounter for antineoplastic chemotherapy: Secondary | ICD-10-CM | POA: Diagnosis not present

## 2023-07-01 DIAGNOSIS — Z79899 Other long term (current) drug therapy: Secondary | ICD-10-CM | POA: Diagnosis not present

## 2023-07-01 DIAGNOSIS — Z510 Encounter for antineoplastic radiation therapy: Secondary | ICD-10-CM | POA: Diagnosis not present

## 2023-07-01 DIAGNOSIS — C712 Malignant neoplasm of temporal lobe: Secondary | ICD-10-CM | POA: Diagnosis not present

## 2023-07-01 LAB — RAD ONC ARIA SESSION SUMMARY
Course Elapsed Days: 13
Plan Fractions Treated to Date: 10
Plan Prescribed Dose Per Fraction: 3.5 Gy
Plan Total Fractions Prescribed: 10
Plan Total Prescribed Dose: 35 Gy
Reference Point Dosage Given to Date: 35 Gy
Reference Point Session Dosage Given: 3.5 Gy
Session Number: 10

## 2023-07-02 NOTE — Radiation Completion Notes (Signed)
 Patient Name: [name], [name] MRN: [id number] Date of Birth: [birth year] Referring Physician: Elissa Hefty, M.D. Date of Service: 2023-07-02 Radiation Oncologist: Lonie Peak, M.D. [facility]                             RADIATION ONCOLOGY END OF TREATMENT NOTE     Diagnosis: C71.1 Malignant neoplasm of frontal lobe Intent: Palliative     ==========DELIVERED PLANS==========  First Treatment Date: 2023-06-18 Last Treatment Date: 2023-07-01   Plan Name: [id number] Site: Frontal Lobe Technique: IMRT Mode: Photon Dose Per Fraction: 3.5 Gy Prescribed Dose (Delivered / Prescribed): 35 Gy / 35 Gy Prescribed Fxs (Delivered / Prescribed): 10 / 10     ==========ON TREATMENT VISIT DATES========== 2023-06-23, 2023-06-30     ==========UPCOMING VISITS========== 07/31/2023 [facility]-RADIATION ONC FOLLOW UP 20 Lonie Peak, MD  07/31/2023 [facility]MED ONCOLOGY LAB [facility] LAB  07/31/2023 [facility] ONCOLOGY EST PT 30 Henreitta Leber, MD  07/31/2023 [facility]MED ONCOLOGY INFUSION 1HR30MIN (90) [facility] INFUSION  07/17/2023 [facility] ONCOLOGY LAB [facility] LAB  07/17/2023 [facility] ONCOLOGY EST PT 30 Henreitta Leber, MD  07/17/2023 [facility] ONCOLOGY INFUSION 1HR30MIN (90) [facility] INFUSION  07/03/2023 [facility] ONCOLOGY LAB [facility] LAB  07/03/2023 [facility] ONCOLOGY EST PT 30 Henreitta Leber, MD  07/03/2023 [facility] ONCOLOGY INFUSION 1HR30MIN (90) [facility] INFUSION        ==========APPENDIX - ON TREATMENT VISIT NOTES==========   See weekly On Treatment Notes in Epic for details in the Media tab (listed as Progress notes on the On Treatment Visit Dates listed above).

## 2023-07-03 ENCOUNTER — Inpatient Hospital Stay: Payer: BC Managed Care – PPO

## 2023-07-03 ENCOUNTER — Encounter: Payer: Self-pay | Admitting: Internal Medicine

## 2023-07-03 ENCOUNTER — Inpatient Hospital Stay: Payer: BC Managed Care – PPO | Admitting: Internal Medicine

## 2023-07-03 VITALS — BP 153/96 | HR 86 | Temp 97.7°F | Resp 16 | Wt 178.1 lb

## 2023-07-03 DIAGNOSIS — Z79899 Other long term (current) drug therapy: Secondary | ICD-10-CM | POA: Diagnosis not present

## 2023-07-03 DIAGNOSIS — R569 Unspecified convulsions: Secondary | ICD-10-CM

## 2023-07-03 DIAGNOSIS — C719 Malignant neoplasm of brain, unspecified: Secondary | ICD-10-CM

## 2023-07-03 DIAGNOSIS — Z510 Encounter for antineoplastic radiation therapy: Secondary | ICD-10-CM | POA: Diagnosis not present

## 2023-07-03 DIAGNOSIS — H0520 Unspecified exophthalmos: Secondary | ICD-10-CM

## 2023-07-03 DIAGNOSIS — Z5111 Encounter for antineoplastic chemotherapy: Secondary | ICD-10-CM | POA: Diagnosis not present

## 2023-07-03 DIAGNOSIS — C712 Malignant neoplasm of temporal lobe: Secondary | ICD-10-CM | POA: Diagnosis not present

## 2023-07-03 LAB — CBC WITH DIFFERENTIAL (CANCER CENTER ONLY)
Abs Immature Granulocytes: 0.02 10*3/uL (ref 0.00–0.07)
Basophils Absolute: 0 10*3/uL (ref 0.0–0.1)
Basophils Relative: 0 %
Eosinophils Absolute: 0 10*3/uL (ref 0.0–0.5)
Eosinophils Relative: 1 %
HCT: 46.5 % — ABNORMAL HIGH (ref 36.0–46.0)
Hemoglobin: 15.1 g/dL — ABNORMAL HIGH (ref 12.0–15.0)
Immature Granulocytes: 0 %
Lymphocytes Relative: 32 %
Lymphs Abs: 1.5 10*3/uL (ref 0.7–4.0)
MCH: 31.3 pg (ref 26.0–34.0)
MCHC: 32.5 g/dL (ref 30.0–36.0)
MCV: 96.3 fL (ref 80.0–100.0)
Monocytes Absolute: 0.3 10*3/uL (ref 0.1–1.0)
Monocytes Relative: 6 %
Neutro Abs: 2.8 10*3/uL (ref 1.7–7.7)
Neutrophils Relative %: 61 %
Platelet Count: 150 10*3/uL (ref 150–400)
RBC: 4.83 MIL/uL (ref 3.87–5.11)
RDW: 15.1 % (ref 11.5–15.5)
WBC Count: 4.6 10*3/uL (ref 4.0–10.5)
nRBC: 0 % (ref 0.0–0.2)

## 2023-07-03 LAB — TSH: TSH: 14.751 u[IU]/mL — ABNORMAL HIGH (ref 0.350–4.500)

## 2023-07-03 LAB — T4, FREE: Free T4: 0.69 ng/dL (ref 0.61–1.12)

## 2023-07-03 MED ORDER — SODIUM CHLORIDE 0.9 % IV SOLN
10.0000 mg/kg | Freq: Once | INTRAVENOUS | Status: AC
  Administered 2023-07-03: 800 mg via INTRAVENOUS
  Filled 2023-07-03: qty 32

## 2023-07-03 MED ORDER — PALONOSETRON HCL INJECTION 0.25 MG/5ML
0.2500 mg | Freq: Once | INTRAVENOUS | Status: AC
  Administered 2023-07-03: 0.25 mg via INTRAVENOUS

## 2023-07-03 MED ORDER — DEXAMETHASONE 1 MG PO TABS: 1.0000 mg | ORAL_TABLET | Freq: Every day | ORAL | 0 refills | Status: DC

## 2023-07-03 MED ORDER — SODIUM CHLORIDE 0.9 % IV SOLN: INTRAVENOUS | Status: DC

## 2023-07-03 NOTE — Progress Notes (Signed)
 [facility] at [facility] [address]  [place], [place] [address] [phone]   Interval Evaluation  Date of Service: 07/03/23 Patient Name: [name] Patient MRN: [id number] Patient DOB: July 02, 1965 Provider: Henreitta Leber, MD  Identifying Statement:  [name] is a 58 y.o. 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)  11/21/2020 Surgery   Left temporal craniotomy, resection with Dr. Maurice Small.  Path is glioblastoma, IDHwt   12/27/2020 - 02/07/2021 Radiation Therapy   IMRT and concurrent Temodar Basilio [place])   03/12/2021 - 08/26/2021 Chemotherapy   Completes 5 cycles of adjuvant 5-day Temodar   08/27/2021 Progression   POD #1   09/06/2021 -  Chemotherapy   Initiates second line therapy; CCNU 90mg /m2 PO q6 weeks and avastin 10mg /kg IV q2 weeks   12/28/2021 Progression   POD #2   01/17/2022 -  Chemotherapy   Third line therapy with Irinotecan and Avastin q2 weeks   11/21/2022 -  Chemotherapy   Transitions to single agent Avastin   05/21/2023 Progression   POD #3   06/18/2023 - 07/01/2023 Radiation Therapy   Re-irradiation 35/10 with concurrent avastin 10mg /kg IV q2 weeks Basilio [place])     Biomarkers:  MGMT Methylated.  IDH 1/2 Wild type.  EGFR Unknown  TERT Unknown   Interval History: TAMEA BAI presents today for cycle #2 avastin, now having completed radiation as of 07/01/23.  Headaches have improved since radiation completed.  No further seizure events.  She continues on vimpat to 200mg  twice per day.  Decadron at 2mg  daily.    H+P (11/25/20) Patient presented to medical attention this past month with first ever seizure.  She describes episode of sudden onset "disconnection" with tongue biting and urination, followed by period of confusion.  CNS imaging demonstrated enhancing mass within left anterior temporal lobe, which was mostly  resected by Dr. Maurice Small on 11/21/20.  Since surgery she had not had recurrence of seizures.  She complains of painful cramping of her right leg, which started ~1 week prior.  At this time the cramping is her main complaint.  Otherwise fully functional, independent, would like to return to work if possible.  Medications: Current Outpatient Medications on File Prior to Visit  Medication Sig Dispense Refill   amLODipine (NORVASC) 10 MG tablet TAKE 1 TABLET BY MOUTH EVERY DAY 90 tablet 1   dexamethasone (DECADRON) 2 MG tablet Take 1 tablet (2 mg total) by mouth daily. 30 tablet 1   HYDROcodone bit-homatropine (HYCODAN) 5-1.5 MG/5ML syrup Take 5 mLs by mouth every 8 (eight) hours as needed for cough. 120 mL 0   Lacosamide 100 MG TABS Take 2 tablets (200 mg total) by mouth 2 (two) times daily. 120 tablet 2   levETIRAcetam (KEPPRA) 750 MG tablet TAKE 2 TABLETS (1,500 MG TOTAL) BY MOUTH 2 (TWO) TIMES DAILY. 360 tablet 1   levothyroxine (SYNTHROID) 100 MCG tablet TAKE 1 TABLET BY MOUTH EVERY DAY 90 tablet 3   LORazepam (ATIVAN) 2 MG tablet TAKE 1 TABLET (2 MG TOTAL) BY MOUTH EVERY 8 (EIGHT) HOURS AS NEEDED FOR SEIZURE. 12 tablet 0   losartan (COZAAR) 50 MG tablet TAKE 1 TABLET BY MOUTH EVERY DAY 90 tablet 1   ondansetron (ZOFRAN-ODT) 4 MG disintegrating tablet Take 1 tablet (4 mg total) by mouth every 8 (eight) hours as needed for nausea or vomiting. 10 tablet 0   Potassium Chloride ER 20 MEQ TBCR  Take 1 tablet (20 mEq total) by mouth daily. 90 tablet 3   prochlorperazine (COMPAZINE) 10 MG tablet Take 10 mg by mouth every 6 (six) hours as needed for nausea or vomiting.     No current facility-administered medications on file prior to visit.    Allergies: No Known Allergies Past Medical History:  Past Medical History:  Diagnosis Date   Bell's palsy    left side of face- notied in smile and eyes, if very tired   Brain cancer ([name])    Complication of anesthesia    N/V   Essential hypertension     External hemorrhoids    History of blood transfusion    post op Hemorroids   History of pneumonia    Hyperlipidemia    Hyperthyroidism    hx 58 years old- oral meds   Iron deficiency anemia    Migraine headache    Pneumonia    x 2 - years ago   PONV (postoperative nausea and vomiting)    PPD positive    Pulmonary nodules    not seen on plain cxr, first detected 1/07 re ct 10/08 pos IPPD > 15 mm 12/09 FOB/lavage 04/20/08 neg afb smear   PVC's (premature ventricular contractions)    Seizure ([name]) 11/15/2020   Sleep apnea    refused CPAP- mouth piece recommended   Tuberculosis    in her late 30's   Past Surgical History:  Past Surgical History:  Procedure Laterality Date   ABDOMINAL HYSTERECTOMY     APPLICATION OF CRANIAL NAVIGATION N/A 11/21/2020   Procedure: APPLICATION OF CRANIAL NAVIGATION;  Surgeon: Jadene Pierini, MD;  Location: [facility] OR;  Service: Neurosurgery;  Laterality: N/A;   BREAST BIOPSY Right 2014   BREAST BIOPSY Left 10/29/2019   CHOLECYSTECTOMY  2014   COLONOSCOPY  05/04/2009   prolapsing external hemorrhoids   CRANIOTOMY Left 11/21/2020   Procedure: Left craniotomy for tumor resection;  Surgeon: Jadene Pierini, MD;  Location[facility] OR;  Service: Neurosurgery;  Laterality: Left;   ENDOMETRIAL ABLATION     HEMORRHOID SURGERY     x 3   TUBAL LIGATION     Social History:  Social History   Socioeconomic History   Marital status: Divorced    Spouse name: Not on file   Number of children: 2   Years of education: Not on file   Highest education level: Associate degree: occupational, Scientist, product/process development, or vocational program  Occupational History   Occupation: accounts IT trainer: [employer]  Tobacco Use   Smoking status: Never   Smokeless tobacco: Never  Vaping Use   Vaping status: Never Used  Substance and Sexual Activity   Alcohol use: No    Alcohol/week: 0.0 standard drinks of alcohol   Drug use: No   Sexual activity: Not on file  Other Topics  Concern   Not on file  Social History Narrative   Divorced with 2 children   She works as a Merchandiser, retail in Audiological scientist    Possible TB exposure 2008 (after nodules found)   From [place] lived there and [place]   Never owned birds      Social Drivers of Health   Financial Resource Strain: Medium Risk (06/13/2021)   Overall Financial Resource Strain (CARDIA)    Difficulty of Paying Living Expenses: Somewhat hard  Food Insecurity: Food Insecurity Present (06/03/2023)   Hunger Vital Sign    Worried About Running Out of Food in the Last Year: Sometimes true  Ran Out of Food in the Last Year: Sometimes true  Transportation Needs: No Transportation Needs (06/03/2023)   PRAPARE - Administrator, Civil Service (Medical): No    Lack of Transportation (Non-Medical): No  Physical Activity: Unknown (04/23/2021)   Exercise Vital Sign    Days of Exercise per Week: 0 days    Minutes of Exercise per Session: Not on file  Stress: No Stress Concern Present (04/23/2021)   [employer] of Occupational Health - Occupational Stress Questionnaire    Feeling of Stress : Only a little  Social Connections: Unknown (09/16/2021)   [facility]   Social Network    Social Network: Not on file  Intimate Partner Violence: Not At Risk (06/03/2023)   Humiliation, Afraid, Rape, and Kick questionnaire    Fear of Current or Ex-Partner: No    Emotionally Abused: No    Physically Abused: No    Sexually Abused: No   Family History:  Family History  Problem Relation Age of Onset   Diabetes type II Mother    Kidney failure Sister    Diabetes Mellitus II Sister    Cancer Maternal Aunt        Stomach cancer   Colon cancer Maternal Aunt        aunts and uncles   Diabetes Mellitus II Maternal Aunt    Pancreatic cancer Maternal Aunt    Colon cancer Maternal Uncle    Diabetes Mellitus II Maternal Uncle    Heart disease Maternal Grandmother        aunts, uncles, sister   Diabetes  Mellitus II Maternal Grandmother    Lung cancer Other        aunts and uncles    Review of Systems: Constitutional: poor appetite Eyes: blurriness of vision Ears, nose, mouth, throat, and face: Doesn't report sore throat Respiratory: Doesn't report cough, dyspnea or wheezes Cardiovascular: Doesn't report palpitation, chest discomfort  Gastrointestinal:  Doesn't report nausea, constipation, diarrhea GU: Doesn't report incontinence Skin: Doesn't report skin rashes Neurological: Per HPI Musculoskeletal: bilateral shoulder pain Behavioral/Psych: +anxiety  Physical Exam: Vitals:   07/03/23 1357  BP: (!) 153/96  Pulse: 86  Resp: 16  Temp: 97.7 F (36.5 C)  SpO2: 100%     KPS: 70. General: Alert, cooperative, pleasant, in no acute distress Head: Normal EENT: No conjunctival injection or scleral icterus.  Lungs: Resp effort normal Cardiac: Regular rate Abdomen: Non-distended abdomen Skin: No rashes cyanosis or petechiae. Extremities: No clubbing or edema  Neurologic Exam: Mental Status: Awake, alert, attentive to examiner. Oriented to self and environment. Language is notable for modest impairment in fluency.  Impaired short term recall. Cranial Nerves: Visual acuity is grossly normal. Visual fields are full. Extra-ocular movements intact. No ptosis. Face L LMN paresis, chronic. Motor: Tone and bulk are normal. Power is full in both arms and legs. Reflexes are symmetric, no pathologic reflexes present.  Sensory: Intact to light touch Gait: Dystaxic  Labs: I have reviewed the data as listed    Component Value Date/Time   NA 137 03/27/2023 1211   K 3.7 03/27/2023 1211   CL 103 03/27/2023 1211   CO2 26 03/27/2023 1211   GLUCOSE 129 (H) 03/27/2023 1211   BUN 11 03/27/2023 1211   CREATININE 1.05 (H) 03/27/2023 1211   CREATININE 0.92 01/25/2020 1424   CALCIUM 10.1 03/27/2023 1211   PROT 8.0 03/27/2023 1211   ALBUMIN 4.5 03/27/2023 1211   AST 17 03/27/2023 1211  ALT  15 03/27/2023 1211   ALKPHOS 141 (H) 03/27/2023 1211   BILITOT 1.6 (H) 03/27/2023 1211   GFRNONAA >60 03/27/2023 1211   GFRNONAA 72 01/25/2020 1424   GFRAA 83 01/25/2020 1424   Lab Results  Component Value Date   WBC 4.6 07/03/2023   NEUTROABS 2.8 07/03/2023   HGB 15.1 (H) 07/03/2023   HCT 46.5 (H) 07/03/2023   MCV 96.3 07/03/2023   PLT 150 07/03/2023     Assessment/Plan 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)  Focal seizures ([name])  Adelyn FELICITE ZEIMET is clinically stable today, now having completed re-irradiation for progressive glioblastoma.  No clinical changes today.   Patient elected to proceed with cycle #2 avastin 10mg /kg IV 2q weeks.  Avastin will be helpful as concurrent therapy given burden of enhancing tumor and steroid requirement.  We reviewed side effects of avastin, including hypertension, bleeding/clotting events, wound healing impairment.  The patient will have a complete blood count, a comprehensive metabolic panel, and urine protein performed prior to each avastin infusion. Labs may need to be performed more often.    Informed consent was obtained verbally at bedside to proceed with avastin.   Avastin should be held for the following:  ANC less than 500  Platelets less than 50,000  LFT or creatinine greater than 2x ULN  If clinical concerns/contraindications develop  Decadron should decrease to 1mg  daily.  For seizures, will con't Vimpat 200mg  BID, increased from 100mg  BID.  Keppra will also continue 1000mg  BID.  We ask that LADESHA PACINI return to clinic in 2 weeks for cycle #3 avastin infusion, or sooner if needed.  Will recommend MRI ~07/24/23, following cycle #3.  All questions were answered. The patient knows to call the clinic with any problems, questions or concerns. No barriers to learning were detected.  The total time spent in the encounter was 30 minutes and more than 50% was on counseling and review of test  results   Henreitta Leber, MD Medical Director of Neuro-Oncology [facility] 07/03/23 2:03 PM

## 2023-07-03 NOTE — Patient Instructions (Signed)
 [facility] - A DEPT OF [facility]  Discharge Instructions: Thank you for choosing [facility] to provide your oncology and hematology care.   If you have a lab appointment with the Cancer Center, please go directly to the Cancer Center and check in at the registration area.   Wear comfortable clothing and clothing appropriate for easy access to any Portacath or PICC line.   We strive to give you quality time with your provider. You may need to reschedule your appointment if you arrive late (15 or more minutes).  Arriving late affects you and other patients whose appointments are after yours.  Also, if you miss three or more appointments without notifying the office, you may be dismissed from the clinic at the provider's discretion.      For prescription refill requests, have your pharmacy contact our office and allow 72 hours for refills to be completed.    Today you received the following chemotherapy and/or immunotherapy agents: Bevacizumab      To help prevent nausea and vomiting after your treatment, we encourage you to take your nausea medication as directed.  BELOW ARE SYMPTOMS THAT SHOULD BE REPORTED IMMEDIATELY: *FEVER GREATER THAN 100.4 F (38 C) OR HIGHER *CHILLS OR SWEATING *NAUSEA AND VOMITING THAT IS NOT CONTROLLED WITH YOUR NAUSEA MEDICATION *UNUSUAL SHORTNESS OF BREATH *UNUSUAL BRUISING OR BLEEDING *URINARY PROBLEMS (pain or burning when urinating, or frequent urination) *BOWEL PROBLEMS (unusual diarrhea, constipation, pain near the anus) TENDERNESS IN MOUTH AND THROAT WITH OR WITHOUT PRESENCE OF ULCERS (sore throat, sores in mouth, or a toothache) UNUSUAL RASH, SWELLING OR PAIN  UNUSUAL VAGINAL DISCHARGE OR ITCHING   Items with * indicate a potential emergency and should be followed up as soon as possible or go to the Emergency Department if any problems should occur.  Please show the CHEMOTHERAPY ALERT CARD or  IMMUNOTHERAPY ALERT CARD at check-in to the Emergency Department and triage nurse.  Should you have questions after your visit or need to cancel or reschedule your appointment, please contact [facility] - A DEPT OF [facility]  Dept: [phone]  and follow the prompts.  Office hours are 8:00 a.m. to 4:30 p.m. Monday - Friday. Please note that voicemails left after 4:00 p.m. may not be returned until the following business day.  We are closed weekends and major holidays. You have access to a nurse at all times for urgent questions. Please call the main number to the clinic Dept: [phone] and follow the prompts.   For any non-urgent questions, you may also contact your provider using MyChart. We now offer e-Visits for anyone 67 and older to request care online for non-urgent symptoms. For details visit [email or web address].   Also download the MyChart app! Go to the app store, search "MyChart", open the app, select [facility], and log in with your MyChart username and password.

## 2023-07-03 NOTE — Addendum Note (Signed)
 Addended by: Henreitta Leber on: 07/03/2023 03:08 PM   Modules accepted: Orders

## 2023-07-04 ENCOUNTER — Other Ambulatory Visit: Payer: Self-pay | Admitting: *Deleted

## 2023-07-04 ENCOUNTER — Encounter: Payer: Self-pay | Admitting: Radiation Oncology

## 2023-07-04 DIAGNOSIS — C719 Malignant neoplasm of brain, unspecified: Secondary | ICD-10-CM

## 2023-07-04 NOTE — Progress Notes (Signed)
 I ordered thyroid labs to rule out Graves disease (hyperthyroidism) for our patient.  Free T4 is nl, TSH elevated. This may be due to some inflammation of the pituitary during RT which could improve months from now. Therefore, I have asked the med/onc nursing team to arrange TSH and Free T4 to coincide w/ med onc labs in ~ 61mo.  -----------------------------------  Lonie Peak, MD

## 2023-07-15 DIAGNOSIS — C719 Malignant neoplasm of brain, unspecified: Secondary | ICD-10-CM | POA: Diagnosis not present

## 2023-07-15 DIAGNOSIS — F445 Conversion disorder with seizures or convulsions: Secondary | ICD-10-CM | POA: Diagnosis not present

## 2023-07-17 ENCOUNTER — Inpatient Hospital Stay: Payer: BC Managed Care – PPO | Attending: Internal Medicine

## 2023-07-17 ENCOUNTER — Inpatient Hospital Stay: Payer: BC Managed Care – PPO | Attending: Internal Medicine | Admitting: Internal Medicine

## 2023-07-17 ENCOUNTER — Inpatient Hospital Stay: Payer: BC Managed Care – PPO

## 2023-07-17 ENCOUNTER — Other Ambulatory Visit: Payer: Self-pay | Admitting: *Deleted

## 2023-07-17 ENCOUNTER — Ambulatory Visit (HOSPITAL_COMMUNITY)
Admission: RE | Admit: 2023-07-17 | Discharge: 2023-07-17 | Disposition: A | Discharge status: Home / Self Care | Source: Ambulatory Visit | Attending: Internal Medicine | Admitting: Internal Medicine

## 2023-07-17 VITALS — BP 159/92 | HR 77 | Temp 97.4°F | Resp 17 | Wt 184.8 lb

## 2023-07-17 DIAGNOSIS — Z79899 Other long term (current) drug therapy: Secondary | ICD-10-CM | POA: Insufficient documentation

## 2023-07-17 DIAGNOSIS — C712 Malignant neoplasm of temporal lobe: Secondary | ICD-10-CM | POA: Diagnosis not present

## 2023-07-17 DIAGNOSIS — R4702 Dysphasia: Secondary | ICD-10-CM | POA: Insufficient documentation

## 2023-07-17 DIAGNOSIS — C719 Malignant neoplasm of brain, unspecified: Secondary | ICD-10-CM | POA: Diagnosis not present

## 2023-07-17 DIAGNOSIS — R569 Unspecified convulsions: Secondary | ICD-10-CM

## 2023-07-17 DIAGNOSIS — Z801 Family history of malignant neoplasm of trachea, bronchus and lung: Secondary | ICD-10-CM | POA: Insufficient documentation

## 2023-07-17 DIAGNOSIS — Z800 Family history of malignant neoplasm of digestive organs: Secondary | ICD-10-CM | POA: Diagnosis not present

## 2023-07-17 DIAGNOSIS — G936 Cerebral edema: Secondary | ICD-10-CM | POA: Diagnosis not present

## 2023-07-17 DIAGNOSIS — Z923 Personal history of irradiation: Secondary | ICD-10-CM | POA: Diagnosis not present

## 2023-07-17 DIAGNOSIS — Z9221 Personal history of antineoplastic chemotherapy: Secondary | ICD-10-CM | POA: Diagnosis not present

## 2023-07-17 LAB — CBC WITH DIFFERENTIAL (CANCER CENTER ONLY)
Abs Immature Granulocytes: 0.09 10*3/uL — ABNORMAL HIGH (ref 0.00–0.07)
Basophils Absolute: 0 10*3/uL (ref 0.0–0.1)
Basophils Relative: 0 %
Eosinophils Absolute: 0.1 10*3/uL (ref 0.0–0.5)
Eosinophils Relative: 1 %
HCT: 42.9 % (ref 36.0–46.0)
Hemoglobin: 14.3 g/dL (ref 12.0–15.0)
Immature Granulocytes: 1 %
Lymphocytes Relative: 16 %
Lymphs Abs: 1.3 10*3/uL (ref 0.7–4.0)
MCH: 31.6 pg (ref 26.0–34.0)
MCHC: 33.3 g/dL (ref 30.0–36.0)
MCV: 94.7 fL (ref 80.0–100.0)
Monocytes Absolute: 0.5 10*3/uL (ref 0.1–1.0)
Monocytes Relative: 6 %
Neutro Abs: 6 10*3/uL (ref 1.7–7.7)
Neutrophils Relative %: 76 %
Platelet Count: 192 10*3/uL (ref 150–400)
RBC: 4.53 MIL/uL (ref 3.87–5.11)
RDW: 14.2 % (ref 11.5–15.5)
WBC Count: 8 10*3/uL (ref 4.0–10.5)
nRBC: 0.3 % — ABNORMAL HIGH (ref 0.0–0.2)

## 2023-07-17 LAB — T4, FREE: Free T4: 0.86 ng/dL (ref 0.61–1.12)

## 2023-07-17 LAB — TSH: TSH: 2.761 u[IU]/mL (ref 0.350–4.500)

## 2023-07-17 LAB — TOTAL PROTEIN, URINE DIPSTICK: Protein, ur: 30 mg/dL — AB

## 2023-07-17 MED ORDER — GADOBUTROL 1 MMOL/ML IV SOLN
10.0000 mL | Freq: Once | INTRAVENOUS | Status: AC | PRN
Start: 2023-07-17 — End: 2023-07-17
  Administered 2023-07-17: 10 mL via INTRAVENOUS

## 2023-07-17 NOTE — Progress Notes (Signed)
 Stat MRI brain scheduled for 5:30 today, patient to arrive at [facility] at 5:00.  Patient & her daughter informed, they verbalize understanding.

## 2023-07-17 NOTE — Progress Notes (Signed)
 [facility] [address]  [place], [place] [address] [phone]   Interval Evaluation  Date of Service: 07/17/23 Patient Name: [name] Patient MRN: [id number] Patient DOB: 09/05/1965 Provider: Henreitta Leber, MD  Identifying Statement:  [name] is a 58 y.o. 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)  11/21/2020 Surgery   Left temporal craniotomy, resection with Dr. Maurice Small.  Path is glioblastoma, IDHwt   12/27/2020 - 02/07/2021 Radiation Therapy   IMRT and concurrent Temodar Basilio [place])   03/12/2021 - 08/26/2021 Chemotherapy   Completes 5 cycles of adjuvant 5-day Temodar   08/27/2021 Progression   POD #1   09/06/2021 -  Chemotherapy   Initiates second line therapy; CCNU 90mg /m2 PO q6 weeks and avastin 10mg /kg IV q2 weeks   12/28/2021 Progression   POD #2   01/17/2022 -  Chemotherapy   Third line therapy with Irinotecan and Avastin q2 weeks   11/21/2022 -  Chemotherapy   Transitions to single agent Avastin   05/21/2023 Progression   POD #3   06/18/2023 - 07/01/2023 Radiation Therapy   Re-irradiation 35/10 with concurrent avastin 10mg /kg IV q2 weeks Basilio [place])     Biomarkers:  MGMT Methylated.  IDH 1/2 Wild type.  EGFR Unknown  TERT Unknown   Interval History: [name] presents today for cycle #3 avastin, now having completed radiation as of 07/01/23.  Family reports notable decline today; she has much more difficulty communicating/speaking.  Her gait is less independent and more of a shuffle.  She has had several falls at home.  No further seizure events.  She continues on vimpat to 200mg  twice per day.  Decadron at 1mg  daily.    H+P (11/25/20) Patient presented to medical attention this past month with first ever seizure.  She describes episode of sudden onset "disconnection" with tongue biting and urination,  followed by period of confusion.  CNS imaging demonstrated enhancing mass within left anterior temporal lobe, which was mostly resected by Dr. Maurice Small on 11/21/20.  Since surgery she had not had recurrence of seizures.  She complains of painful cramping of her right leg, which started ~1 week prior.  At this time the cramping is her main complaint.  Otherwise fully functional, independent, would like to return to work if possible.  Medications: Current Outpatient Medications on File Prior to Visit  Medication Sig Dispense Refill   amLODipine (NORVASC) 10 MG tablet TAKE 1 TABLET BY MOUTH EVERY DAY 90 tablet 1   dexamethasone (DECADRON) 1 MG tablet Take 1 tablet (1 mg total) by mouth daily. 30 tablet 0   HYDROcodone bit-homatropine (HYCODAN) 5-1.5 MG/5ML syrup Take 5 mLs by mouth every 8 (eight) hours as needed for cough. 120 mL 0   Lacosamide 100 MG TABS Take 2 tablets (200 mg total) by mouth 2 (two) times daily. 120 tablet 2   levETIRAcetam (KEPPRA) 750 MG tablet TAKE 2 TABLETS (1,500 MG TOTAL) BY MOUTH 2 (TWO) TIMES DAILY. 360 tablet 1   levothyroxine (SYNTHROID) 100 MCG tablet TAKE 1 TABLET BY MOUTH EVERY DAY 90 tablet 3   LORazepam (ATIVAN) 2 MG tablet TAKE 1 TABLET (2 MG TOTAL) BY MOUTH EVERY 8 (EIGHT) HOURS AS NEEDED FOR SEIZURE. 12 tablet 0   losartan (COZAAR) 50 MG tablet TAKE 1 TABLET BY MOUTH EVERY DAY 90 tablet 1   ondansetron (ZOFRAN-ODT) 4 MG disintegrating tablet Take 1 tablet (4 mg  total) by mouth every 8 (eight) hours as needed for nausea or vomiting. 10 tablet 0   Potassium Chloride ER 20 MEQ TBCR Take 1 tablet (20 mEq total) by mouth daily. 90 tablet 3   prochlorperazine (COMPAZINE) 10 MG tablet Take 10 mg by mouth every 6 (six) hours as needed for nausea or vomiting.     No current facility-administered medications on file prior to visit.    Allergies: No Known Allergies Past Medical History:  Past Medical History:  Diagnosis Date   Bell's palsy    left side of face-  notied in smile and eyes, if very tired   Brain cancer ([name])    Complication of anesthesia    N/V   Essential hypertension    External hemorrhoids    History of blood transfusion    post op Hemorroids   History of pneumonia    Hyperlipidemia    Hyperthyroidism    hx 58 years old- oral meds   Iron deficiency anemia    Migraine headache    Pneumonia    x 2 - years ago   PONV (postoperative nausea and vomiting)    PPD positive    Pulmonary nodules    not seen on plain cxr, first detected 1/07 re ct 10/08 pos IPPD > 15 mm 12/09 FOB/lavage 04/20/08 neg afb smear   PVC's (premature ventricular contractions)    Seizure ([name]) 11/15/2020   Sleep apnea    refused CPAP- mouth piece recommended   Tuberculosis    in her late 30's   Past Surgical History:  Past Surgical History:  Procedure Laterality Date   ABDOMINAL HYSTERECTOMY     APPLICATION OF CRANIAL NAVIGATION N/A 11/21/2020   Procedure: APPLICATION OF CRANIAL NAVIGATION;  Surgeon: Jadene Pierini, MD;  Location: [facility] OR;  Service: Neurosurgery;  Laterality: N/A;   BREAST BIOPSY Right 2014   BREAST BIOPSY Left 10/29/2019   CHOLECYSTECTOMY  2014   COLONOSCOPY  05/04/2009   prolapsing external hemorrhoids   CRANIOTOMY Left 11/21/2020   Procedure: Left craniotomy for tumor resection;  Surgeon: Jadene Pierini, MD;  Location[facility] OR;  Service: Neurosurgery;  Laterality: Left;   ENDOMETRIAL ABLATION     HEMORRHOID SURGERY     x 3   TUBAL LIGATION     Social History:  Social History   Socioeconomic History   Marital status: Divorced    Spouse name: Not on file   Number of children: 2   Years of education: Not on file   Highest education level: Associate degree: occupational, Scientist, product/process development, or vocational program  Occupational History   Occupation: accounts IT trainer: [employer]  Tobacco Use   Smoking status: Never   Smokeless tobacco: Never  Vaping Use   Vaping status: Never Used  Substance and Sexual Activity    Alcohol use: No    Alcohol/week: 0.0 standard drinks of alcohol   Drug use: No   Sexual activity: Not on file  Other Topics Concern   Not on file  Social History Narrative   Divorced with 2 children   She works as a Merchandiser, retail in Audiological [employer]    Possible TB exposure 2008 (after nodules found)   From [place] lived there and [place]   Never owned birds      Social Drivers of Health   Financial Resource Strain: Medium Risk (06/13/2021)   Overall Financial Resource Strain (CARDIA)    Difficulty of Paying Living Expenses: Somewhat hard  Food Insecurity: Food  Insecurity Present (06/03/2023)   Hunger Vital Sign    Worried About Running Out of Food in the Last Year: Sometimes true    Ran Out of Food in the Last Year: Sometimes true  Transportation Needs: No Transportation Needs (06/03/2023)   PRAPARE - Administrator, Civil Service (Medical): No    Lack of Transportation (Non-Medical): No  Physical Activity: Unknown (04/23/2021)   Exercise Vital Sign    Days of Exercise per Week: 0 days    Minutes of Exercise per Session: Not on file  Stress: No Stress Concern Present (04/23/2021)   [employer] of Occupational Health - Occupational Stress Questionnaire    Feeling of Stress : Only a little  Social Connections: Unknown (09/16/2021)   [facility] from [facility]   Social Network    Social Network: Not on file  Intimate Partner Violence: Not At Risk (06/03/2023)   Humiliation, Afraid, Rape, and Kick questionnaire    Fear of Current or Ex-Partner: No    Emotionally Abused: No    Physically Abused: No    Sexually Abused: No   Family History:  Family History  Problem Relation Age of Onset   Diabetes type II Mother    Kidney failure Sister    Diabetes Mellitus II Sister    Cancer Maternal Aunt        Stomach cancer   Colon cancer Maternal Aunt        aunts and uncles   Diabetes Mellitus II Maternal Aunt    Pancreatic cancer Maternal Aunt    Colon cancer  Maternal Uncle    Diabetes Mellitus II Maternal Uncle    Heart disease Maternal Grandmother        aunts, uncles, sister   Diabetes Mellitus II Maternal Grandmother    Lung cancer Other        aunts and uncles    Review of Systems: Constitutional: poor appetite Eyes: blurriness of vision Ears, nose, mouth, throat, and face: Doesn't report sore throat Respiratory: Doesn't report cough, dyspnea or wheezes Cardiovascular: Doesn't report palpitation, chest discomfort  Gastrointestinal:  Doesn't report nausea, constipation, diarrhea GU: Doesn't report incontinence Skin: Doesn't report skin rashes Neurological: Per HPI Musculoskeletal: bilateral shoulder pain Behavioral/Psych: +anxiety  Physical Exam: Vitals:   07/17/23 1222 07/17/23 1223  BP: (!) 172/102 (!) 159/92  Pulse: 77   Resp: 17   Temp: (!) 97.4 F (36.3 C)   SpO2: 100%     KPS: 70. General: Alert, cooperative, pleasant, in no acute distress Head: Normal EENT: No conjunctival injection or scleral icterus.  Lungs: Resp effort normal Cardiac: Regular rate Abdomen: Non-distended abdomen Skin: No rashes cyanosis or petechiae. Extremities: No clubbing or edema  Neurologic Exam: Mental Status: Awake, alert, attentive to examiner. Oriented to self and environment. Language is notable for impairment in fluency.  Comprehension limited with multi-step commands.  Impaired short term recall. Cranial Nerves: Visual acuity is grossly normal. Visual fields are full. Extra-ocular movements intact. No ptosis. Face L LMN paresis, chronic. Motor: Tone and bulk are normal. Power is full in both arms and legs. Reflexes are symmetric, no pathologic reflexes present.  Sensory: Intact to light touch Gait: Dystaxic  Labs: I have reviewed the data as listed    Component Value Date/Time   NA 137 03/27/2023 1211   K 3.7 03/27/2023 1211   CL 103 03/27/2023 1211   CO2 26 03/27/2023 1211   GLUCOSE 129 (H) 03/27/2023 1211   BUN 11  03/27/2023 1211   CREATININE 1.05 (H) 03/27/2023 1211   CREATININE 0.92 01/25/2020 1424   CALCIUM 10.1 03/27/2023 1211   PROT 8.0 03/27/2023 1211   ALBUMIN 4.5 03/27/2023 1211   AST 17 03/27/2023 1211   ALT 15 03/27/2023 1211   ALKPHOS 141 (H) 03/27/2023 1211   BILITOT 1.6 (H) 03/27/2023 1211   GFRNONAA >60 03/27/2023 1211   GFRNONAA 72 01/25/2020 1424   GFRAA 83 01/25/2020 1424   Lab Results  Component Value Date   WBC 8.0 07/17/2023   NEUTROABS 6.0 07/17/2023   HGB 14.3 07/17/2023   HCT 42.9 07/17/2023   MCV 94.7 07/17/2023   PLT 192 07/17/2023     Assessment/Plan 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)  Focal seizures ([name])  Garry ALFIE ALDERFER presents today with clinical changes, worsening dysphasia and dystaxia, which localize to dominant hemisphere, tumor site.  No other systemic insult is appreciated to account for the decline, and there have been no witnessed seizures.     Concern for progression of disease is elevated at this time.  We recommended deferring today's infusion and setting up brain MRI for evaluation ASAP.    Decadron may con't 1mg  daily for now.  For seizures, will con't Vimpat 200mg  BID.  Keppra will also continue 1000mg  BID.  We will give her and family a call once MRI results are available for review.  Will then proceed with next steps for goals of care and treatment plan.  All questions were answered. The patient knows to call the clinic with any problems, questions or concerns. No barriers to learning were detected.  The total time spent in the encounter was 40 minutes and more than 50% was on counseling and review of test results   Henreitta Leber, MD Medical Director of Neuro-Oncology [facility] 07/17/23 12:11 PM

## 2023-07-18 ENCOUNTER — Other Ambulatory Visit: Payer: Self-pay | Admitting: Internal Medicine

## 2023-07-18 ENCOUNTER — Encounter: Payer: Self-pay | Admitting: Internal Medicine

## 2023-07-18 ENCOUNTER — Other Ambulatory Visit: Payer: Self-pay

## 2023-07-18 MED ORDER — ASPIRIN 325 MG PO TBEC: 325.0000 mg | DELAYED_RELEASE_TABLET | Freq: Every day | ORAL | 2 refills | Status: DC

## 2023-07-21 ENCOUNTER — Inpatient Hospital Stay

## 2023-07-21 ENCOUNTER — Telehealth: Payer: Self-pay | Admitting: Internal Medicine

## 2023-07-24 ENCOUNTER — Inpatient Hospital Stay

## 2023-07-24 ENCOUNTER — Ambulatory Visit

## 2023-07-24 ENCOUNTER — Telehealth: Payer: Self-pay | Admitting: Internal Medicine

## 2023-07-24 ENCOUNTER — Inpatient Hospital Stay: Admitting: Internal Medicine

## 2023-07-24 ENCOUNTER — Ambulatory Visit (HOSPITAL_COMMUNITY): Payer: BC Managed Care – PPO

## 2023-07-24 VITALS — BP 144/103 | HR 92 | Temp 97.9°F | Resp 18 | Wt 178.3 lb

## 2023-07-24 DIAGNOSIS — Z800 Family history of malignant neoplasm of digestive organs: Secondary | ICD-10-CM | POA: Diagnosis not present

## 2023-07-24 DIAGNOSIS — Z79899 Other long term (current) drug therapy: Secondary | ICD-10-CM | POA: Diagnosis not present

## 2023-07-24 DIAGNOSIS — C719 Malignant neoplasm of brain, unspecified: Secondary | ICD-10-CM

## 2023-07-24 DIAGNOSIS — Z923 Personal history of irradiation: Secondary | ICD-10-CM | POA: Diagnosis not present

## 2023-07-24 DIAGNOSIS — R4702 Dysphasia: Secondary | ICD-10-CM | POA: Diagnosis not present

## 2023-07-24 DIAGNOSIS — R569 Unspecified convulsions: Secondary | ICD-10-CM

## 2023-07-24 DIAGNOSIS — Z801 Family history of malignant neoplasm of trachea, bronchus and lung: Secondary | ICD-10-CM | POA: Diagnosis not present

## 2023-07-24 DIAGNOSIS — C712 Malignant neoplasm of temporal lobe: Secondary | ICD-10-CM | POA: Diagnosis not present

## 2023-07-24 DIAGNOSIS — Z9221 Personal history of antineoplastic chemotherapy: Secondary | ICD-10-CM | POA: Diagnosis not present

## 2023-07-24 LAB — CBC WITH DIFFERENTIAL (CANCER CENTER ONLY)
Abs Immature Granulocytes: 0.02 10*3/uL (ref 0.00–0.07)
Basophils Absolute: 0 10*3/uL (ref 0.0–0.1)
Basophils Relative: 0 %
Eosinophils Absolute: 0 10*3/uL (ref 0.0–0.5)
Eosinophils Relative: 0 %
HCT: 45 % (ref 36.0–46.0)
Hemoglobin: 15 g/dL (ref 12.0–15.0)
Immature Granulocytes: 0 %
Lymphocytes Relative: 23 %
Lymphs Abs: 1 10*3/uL (ref 0.7–4.0)
MCH: 31.4 pg (ref 26.0–34.0)
MCHC: 33.3 g/dL (ref 30.0–36.0)
MCV: 94.3 fL (ref 80.0–100.0)
Monocytes Absolute: 0.5 10*3/uL (ref 0.1–1.0)
Monocytes Relative: 10 %
Neutro Abs: 3 10*3/uL (ref 1.7–7.7)
Neutrophils Relative %: 67 %
Platelet Count: 133 10*3/uL — ABNORMAL LOW (ref 150–400)
RBC: 4.77 MIL/uL (ref 3.87–5.11)
RDW: 14.5 % (ref 11.5–15.5)
WBC Count: 4.6 10*3/uL (ref 4.0–10.5)
nRBC: 0 % (ref 0.0–0.2)

## 2023-07-24 LAB — TOTAL PROTEIN, URINE DIPSTICK: Protein, ur: NEGATIVE mg/dL

## 2023-07-24 LAB — TSH: TSH: 10.325 u[IU]/mL — ABNORMAL HIGH (ref 0.350–4.500)

## 2023-07-24 LAB — T4, FREE: Free T4: 1.01 ng/dL (ref 0.61–1.12)

## 2023-07-24 MED ORDER — LOSARTAN POTASSIUM 50 MG PO TABS: 100.0000 mg | ORAL_TABLET | Freq: Every day | ORAL | Status: DC

## 2023-07-24 NOTE — Progress Notes (Signed)
 [facility] at [facility] [address]  [place], [place] [address] [phone]   Interval Evaluation  Date of Service: 07/24/23 Patient Name: [name] Patient MRN: [id number] Patient DOB: 1965-11-28 Provider: Henreitta Leber, MD  Identifying Statement:  [name] is a 58 y.o. 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)  11/21/2020 Surgery   Left temporal craniotomy, resection with Dr. Maurice [name].  Path is glioblastoma, IDHwt   12/27/2020 - 02/07/2021 Radiation Therapy   IMRT and concurrent Temodar [name])   03/12/2021 - 08/26/2021 Chemotherapy   Completes 5 cycles of adjuvant 5-day Temodar   08/27/2021 Progression   POD #1   09/06/2021 -  Chemotherapy   Initiates second line therapy; CCNU 90mg /m2 PO q6 weeks and avastin 10mg /kg IV q2 weeks   12/28/2021 Progression   POD #2   01/17/2022 -  Chemotherapy   Third line therapy with Irinotecan and Avastin q2 weeks   11/21/2022 -  Chemotherapy   Transitions to single agent Avastin   05/21/2023 Progression   POD #3   06/18/2023 - 07/01/2023 Radiation Therapy   Re-irradiation 35/10 with concurrent avastin 10mg /kg IV q2 weeks [name])     Biomarkers:  MGMT Methylated.  IDH 1/2 Wild type.  EGFR Unknown  TERT Unknown   Interval History: [name] presents today for planned cycle #3 avastin, now having completed radiation as of 07/01/23.  Patient and family report no significant changes after dramatic decline, possible stroke, last week; she continues to have difficulty communicating/speaking.  Her gait is less independent and more of a shuffle, though not needing assistance.  No further seizure events.  She continues on vimpat to 200mg  twice per day.  Decadron at 1mg  daily.    H+P (11/25/20) Patient presented to medical attention this past month with first ever seizure.  She describes  episode of sudden onset "disconnection" with tongue biting and urination, followed by period of confusion.  CNS imaging demonstrated enhancing mass within left anterior temporal lobe, which was mostly resected by Dr. Maurice [name] on 11/21/20.  Since surgery she had not had recurrence of seizures.  She complains of painful cramping of her right leg, which started ~1 week prior.  At this time the cramping is her main complaint.  Otherwise fully functional, independent, would like to return to work if possible.  Medications: Current Outpatient Medications on File Prior to Visit  Medication Sig Dispense Refill   amLODipine (NORVASC) 10 MG tablet TAKE 1 TABLET BY MOUTH EVERY DAY 90 tablet 1   aspirin EC 325 MG tablet Take 1 tablet (325 mg total) by mouth daily. 30 tablet 2   dexamethasone (DECADRON) 1 MG tablet Take 1 tablet (1 mg total) by mouth daily. 30 tablet 0   HYDROcodone bit-homatropine (HYCODAN) 5-1.5 MG/5ML syrup Take 5 mLs by mouth every 8 (eight) hours as needed for cough. 120 mL 0   Lacosamide 100 MG TABS Take 2 tablets (200 mg total) by mouth 2 (two) times daily. 120 tablet 2   levETIRAcetam (KEPPRA) 750 MG tablet TAKE 2 TABLETS (1,500 MG TOTAL) BY MOUTH 2 (TWO) TIMES DAILY. 360 tablet 1   levothyroxine (SYNTHROID) 100 MCG tablet TAKE 1 TABLET BY MOUTH EVERY DAY 90 tablet 3   LORazepam (ATIVAN) 2 MG tablet TAKE 1 TABLET (2 MG TOTAL) BY MOUTH EVERY 8 (EIGHT) HOURS AS NEEDED FOR SEIZURE. 12 tablet 0   losartan (COZAAR) 50  MG tablet TAKE 1 TABLET BY MOUTH EVERY DAY 90 tablet 1   ondansetron (ZOFRAN-ODT) 4 MG disintegrating tablet Take 1 tablet (4 mg total) by mouth every 8 (eight) hours as needed for nausea or vomiting. 10 tablet 0   Potassium Chloride ER 20 MEQ TBCR Take 1 tablet (20 mEq total) by mouth daily. 90 tablet 3   prochlorperazine (COMPAZINE) 10 MG tablet Take 10 mg by mouth every 6 (six) hours as needed for nausea or vomiting.     No current facility-administered medications on file  prior to visit.    Allergies: No Known Allergies Past Medical History:  Past Medical History:  Diagnosis Date   Bell's palsy    left side of face- notied in smile and eyes, if very tired   Brain cancer ([name])    Complication of anesthesia    N/V   Essential hypertension    External hemorrhoids    History of blood transfusion    post op Hemorroids   History of pneumonia    Hyperlipidemia    Hyperthyroidism    hx 58 years old- oral meds   Iron deficiency anemia    Migraine headache    Pneumonia    x 2 - years ago   PONV (postoperative nausea and vomiting)    PPD positive    Pulmonary nodules    not seen on plain cxr, first detected 1/07 re ct 10/08 pos IPPD > 15 mm 12/09 FOB/lavage 04/20/08 neg afb smear   PVC's (premature ventricular contractions)    Seizure ([name]) 11/15/2020   Sleep apnea    refused CPAP- mouth piece recommended   Tuberculosis    in her late 30's   Past Surgical History:  Past Surgical History:  Procedure Laterality Date   ABDOMINAL HYSTERECTOMY     APPLICATION OF CRANIAL NAVIGATION N/A 11/21/2020   Procedure: APPLICATION OF CRANIAL NAVIGATION;  Surgeon: [name] Pierini, MD;  Location: [facility] OR;  Service: Neurosurgery;  Laterality: N/A;   BREAST BIOPSY Right 2014   BREAST BIOPSY Left 10/29/2019   CHOLECYSTECTOMY  2014   COLONOSCOPY  05/04/2009   prolapsing external hemorrhoids   CRANIOTOMY Left 11/21/2020   Procedure: Left craniotomy for tumor resection;  Surgeon: [name] Pierini, MD;  Location: [facility] OR;  Service: Neurosurgery;  Laterality: Left;   ENDOMETRIAL ABLATION     HEMORRHOID SURGERY     x 3   TUBAL LIGATION     Social History:  Social History   Socioeconomic History   Marital status: Divorced    Spouse name: Not on file   Number of children: 2   Years of education: Not on file   Highest education level: Associate degree: occupational, Scientist, product/process development, or vocational program  Occupational History   Occupation: accounts Research officer, political party: [employer]  Tobacco Use   Smoking status: Never   Smokeless tobacco: Never  Vaping Use   Vaping status: Never Used  Substance and Sexual Activity   Alcohol use: No    Alcohol/week: 0.0 standard drinks of alcohol   Drug use: No   Sexual activity: Not on file  Other Topics Concern   Not on file  Social History Narrative   Divorced with 2 children   She works as a Merchandiser, retail in Audiological scientist    Possible TB exposure 2008 (after nodules found)   From [place] lived there and [place]   Never owned birds      Social Drivers of Corporate investment banker  Strain: Medium Risk (06/13/2021)   Overall Financial Resource Strain (CARDIA)    Difficulty of Paying Living Expenses: Somewhat hard  Food Insecurity: Food Insecurity Present (06/03/2023)   Hunger Vital Sign    Worried About Running Out of Food in the Last Year: Sometimes true    Ran Out of Food in the Last Year: Sometimes true  Transportation Needs: No Transportation Needs (06/03/2023)   PRAPARE - Administrator, Civil Service (Medical): No    Lack of Transportation (Non-Medical): No  Physical Activity: Unknown (04/23/2021)   Exercise Vital Sign    Days of Exercise per Week: 0 days    Minutes of Exercise per Session: Not on file  Stress: No Stress Concern Present (04/23/2021)   [employer] of Occupational Health - Occupational Stress Questionnaire    Feeling of Stress : Only a little  Social Connections: Unknown (09/16/2021)   Received from [facility]   Social Network    Social Network: Not on file  Intimate Partner Violence: Not At Risk (06/03/2023)   Humiliation, Afraid, Rape, and Kick questionnaire    Fear of Current or Ex-Partner: No    Emotionally Abused: No    Physically Abused: No    Sexually Abused: No   Family History:  Family History  Problem Relation Age of Onset   Diabetes type II Mother    Kidney failure Sister    Diabetes Mellitus II Sister    Cancer Maternal Aunt         Stomach cancer   Colon cancer Maternal Aunt        aunts and uncles   Diabetes Mellitus II Maternal Aunt    Pancreatic cancer Maternal Aunt    Colon cancer Maternal Uncle    Diabetes Mellitus II Maternal Uncle    Heart disease Maternal Grandmother        aunts, uncles, sister   Diabetes Mellitus II Maternal Grandmother    Lung cancer Other        aunts and uncles    Review of Systems: Constitutional: poor appetite Eyes: blurriness of vision Ears, nose, mouth, throat, and face: Doesn't report sore throat Respiratory: Doesn't report cough, dyspnea or wheezes Cardiovascular: Doesn't report palpitation, chest discomfort  Gastrointestinal:  Doesn't report nausea, constipation, diarrhea GU: Doesn't report incontinence Skin: Doesn't report skin rashes Neurological: Per HPI Musculoskeletal: bilateral shoulder pain Behavioral/Psych: +anxiety  Physical Exam: Vitals:   07/24/23 0938 07/24/23 0939  BP: (!) 160/104 (!) 144/103  Pulse: 92   Resp: 18   Temp: 97.9 F (36.6 C)   SpO2: 97%     KPS: 70. General: Alert, cooperative, pleasant, in no acute distress Head: Normal EENT: No conjunctival injection or scleral icterus.  Lungs: Resp effort normal Cardiac: Regular rate Abdomen: Non-distended abdomen Skin: No rashes cyanosis or petechiae. Extremities: No clubbing or edema  Neurologic Exam: Mental Status: Awake, alert, attentive to examiner. Oriented to self and environment. Language is notable for impairment in fluency.  Comprehension limited with multi-step commands.  Impaired short term recall. Cranial Nerves: Visual acuity is grossly normal. Visual fields are full. Extra-ocular movements intact. No ptosis. Face L LMN paresis, chronic. Motor: Tone and bulk are normal. Power is full in both arms and legs. Reflexes are symmetric, no pathologic reflexes present.  Sensory: Intact to light touch Gait: Dystaxic  Labs: I have reviewed the data as listed    Component Value  Date/Time   NA 137 03/27/2023 1211   K 3.7 03/27/2023  1211   CL 103 03/27/2023 1211   CO2 26 03/27/2023 1211   GLUCOSE 129 (H) 03/27/2023 1211   BUN 11 03/27/2023 1211   CREATININE 1.05 (H) 03/27/2023 1211   CREATININE 0.92 01/25/2020 1424   CALCIUM 10.1 03/27/2023 1211   PROT 8.0 03/27/2023 1211   ALBUMIN 4.5 03/27/2023 1211   AST 17 03/27/2023 1211   ALT 15 03/27/2023 1211   ALKPHOS 141 (H) 03/27/2023 1211   BILITOT 1.6 (H) 03/27/2023 1211   GFRNONAA >60 03/27/2023 1211   GFRNONAA 72 01/25/2020 1424   GFRAA 83 01/25/2020 1424   Lab Results  Component Value Date   WBC 4.6 07/24/2023   NEUTROABS 3.0 07/24/2023   HGB 15.0 07/24/2023   HCT 45.0 07/24/2023   MCV 94.3 07/24/2023   PLT 133 (L) 07/24/2023    Imaging:  [facility] Clinician Interpretation: I have personally reviewed the CNS images as listed.  My interpretation, in the context of the patient's clinical presentation, is  stroke vs recurrent tumor  MR BRAIN W WO CONTRAST Result Date: 07/17/2023 CLINICAL DATA:  Brain/CNS neoplasm, assess treatment response. EXAM: MRI HEAD WITHOUT AND WITH CONTRAST TECHNIQUE: Multiplanar, multiecho pulse sequences of the brain and surrounding structures were obtained without and with intravenous contrast. CONTRAST:  10mL GADAVIST GADOBUTROL 1 MMOL/ML IV SOLN COMPARISON:  MRI brain 05/21/2023. FINDINGS: Brain: There is a new 2.1 x 1.0 cm focus of restricted diffusion centered in the body of the left caudate nucleus and left corona radiata with partial extension into the internal capsule and lentiform nuclei. There is minimal stippled enhancement throughout this region (series 16, image 91). Mild associated edema without significant mass effect. Just posterior and inferior to the region of diffusion signal abnormality in the lentiform nuclei there is a new region of nodular enhancement involving the posterior left lentiform nuclei best appreciated on axial image 74/160. Intrinsic T1 signal abnormality  in the anterior inferior left frontal lobe which is decreased in size from prior with minimal associated enhancement. Similar edema in this region. Postsurgical changes of left temporal craniotomy with subjacent resection cavity in the left middle cranial fossa. Redemonstration of diffusion signal abnormality involving the left temporal stem white matter similar to prior. Intrinsic T1 signal abnormality along the left temporal white matter and left temporal horn with minimal associated nodular enhancement which is slightly decreased from prior. Additional areas of diffusion signal abnormality in the left periatrial white matter and involving the left splenium of the corpus callosum again noted (series 5, images 32 and 24). No evidence of acute intracranial hemorrhage. White matter signal abnormality suggestive of underlying chronic microvascular ischemic changes. No midline shift. No extra-axial fluid collections. The basilar cisterns are patent. Vascular: Normal flow voids. Skull and upper cervical spine: Normal marrow signal. Sinuses/Orbits: Negative. Other: None. IMPRESSION: 1. New 2.1 x 1.0 cm focus of restricted diffusion centered in the body of the left caudate nucleus and left corona radiata with associated minimal enhancement. Additional areas of nodular enhancement in the posterior left lentiform nuclei are new from prior. Findings concerning for a new site of intracranial neoplasm involvement. However, given the relative improvement in disease burden elsewhere in the brain finding could reflect subacute infarct although this is considered less likely. Recommend short interval follow-up MRI. 2. Decreased size and extent of enhancement in the anterior inferior left frontal lobe and along the left temporal horn with residual intrinsic T1 signal abnormality. 3. Postsurgical changes of left temporal craniotomy with similar subjacent resection cavity in the  left middle cranial fossa. Electronically Signed   By:  Emily Filbert M.D.   On: 07/17/2023 19:55   Assessment/Plan 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)  Focal seizures ([name])  Jermaine ARLIE POSCH is clinically stable today, with noted expressive dysphasia that was new as of visit last week. MRI brain demonstrated either recurrence of tumor within left subcortex, or stroke.  Stroke would be considered a potential complication of avastin.  She has been dosing aspirin 325mg  daily as instructed.  We recommended repeating a brain MRI in 3 weeks, which would fully identify the etiology of this new focus of DWI signal and enhancement.  In the meantime, because of potential stroke and elevated blood pressure, we will defer upcoming avastin infusion.  Will re-evaluate following the next MRI.  For HTN, losartan will be increased to 100mg  daily.  She will take her BP at home with a cuff and follow up with her PCP for further management.  Will also con't amlodipine 10mg  daily.  Decadron may con't 1mg  daily for now.  For seizures, will con't Vimpat 200mg  BID.  Keppra will also continue 1000mg  BID.  All questions were answered. The patient knows to call the clinic with any problems, questions or concerns. No barriers to learning were detected.  The total time spent in the encounter was 40 minutes and more than 50% was on counseling and review of test results   [name] Leber, MD Medical Director of Neuro-Oncology [facility] 07/24/23 9:43 AM

## 2023-07-24 NOTE — Telephone Encounter (Signed)
 Completed - Scheduled patients appt. Patient is aware of all other appts scheduled also.

## 2023-07-25 NOTE — Progress Notes (Incomplete)
 [name] is here today for telephone follow up post radiation to the brain.    They completed their radiation on: ***  Does the patient complain of any of the following: Headache: *** Visual Changes: *** Hearing Changes: *** Nausea: *** Vomiting: *** Balance or coordination issues: *** Memory issues: ***  Is the patient currently on a Decadron regimen? : ***  Additional comments if applicable:

## 2023-07-31 ENCOUNTER — Ambulatory Visit
Admission: RE | Admit: 2023-07-31 | Discharge: 2023-07-31 | Disposition: A | Discharge status: Home / Self Care | Payer: BC Managed Care – PPO | Source: Ambulatory Visit | Attending: Radiation Oncology | Admitting: Radiation Oncology

## 2023-07-31 ENCOUNTER — Other Ambulatory Visit: Payer: BC Managed Care – PPO

## 2023-07-31 ENCOUNTER — Ambulatory Visit: Payer: BC Managed Care – PPO | Admitting: Internal Medicine

## 2023-07-31 ENCOUNTER — Inpatient Hospital Stay

## 2023-07-31 ENCOUNTER — Ambulatory Visit: Payer: BC Managed Care – PPO

## 2023-07-31 DIAGNOSIS — C711 Malignant neoplasm of frontal lobe: Secondary | ICD-10-CM

## 2023-08-07 ENCOUNTER — Inpatient Hospital Stay: Admitting: Internal Medicine

## 2023-08-07 ENCOUNTER — Encounter: Payer: Self-pay | Admitting: Nurse Practitioner

## 2023-08-07 ENCOUNTER — Inpatient Hospital Stay (HOSPITAL_BASED_OUTPATIENT_CLINIC_OR_DEPARTMENT_OTHER): Admitting: Nurse Practitioner

## 2023-08-07 ENCOUNTER — Inpatient Hospital Stay: Attending: Internal Medicine

## 2023-08-07 ENCOUNTER — Other Ambulatory Visit: Payer: Self-pay

## 2023-08-07 ENCOUNTER — Inpatient Hospital Stay

## 2023-08-07 ENCOUNTER — Encounter: Payer: Self-pay | Admitting: Internal Medicine

## 2023-08-07 VITALS — BP 175/109 | HR 74 | Temp 98.2°F | Resp 17 | Wt 181.4 lb

## 2023-08-07 VITALS — BP 176/118 | HR 70

## 2023-08-07 DIAGNOSIS — Z800 Family history of malignant neoplasm of digestive organs: Secondary | ICD-10-CM | POA: Insufficient documentation

## 2023-08-07 DIAGNOSIS — R4702 Dysphasia: Secondary | ICD-10-CM | POA: Insufficient documentation

## 2023-08-07 DIAGNOSIS — Z801 Family history of malignant neoplasm of trachea, bronchus and lung: Secondary | ICD-10-CM | POA: Insufficient documentation

## 2023-08-07 DIAGNOSIS — C719 Malignant neoplasm of brain, unspecified: Secondary | ICD-10-CM

## 2023-08-07 DIAGNOSIS — Z923 Personal history of irradiation: Secondary | ICD-10-CM | POA: Insufficient documentation

## 2023-08-07 DIAGNOSIS — R569 Unspecified convulsions: Secondary | ICD-10-CM

## 2023-08-07 DIAGNOSIS — I10 Essential (primary) hypertension: Secondary | ICD-10-CM | POA: Diagnosis not present

## 2023-08-07 DIAGNOSIS — Z9221 Personal history of antineoplastic chemotherapy: Secondary | ICD-10-CM | POA: Diagnosis not present

## 2023-08-07 DIAGNOSIS — Z79899 Other long term (current) drug therapy: Secondary | ICD-10-CM | POA: Insufficient documentation

## 2023-08-07 DIAGNOSIS — C712 Malignant neoplasm of temporal lobe: Secondary | ICD-10-CM | POA: Diagnosis not present

## 2023-08-07 DIAGNOSIS — R530 Neoplastic (malignant) related fatigue: Secondary | ICD-10-CM

## 2023-08-07 DIAGNOSIS — Z7189 Other specified counseling: Secondary | ICD-10-CM | POA: Diagnosis not present

## 2023-08-07 DIAGNOSIS — Z515 Encounter for palliative care: Secondary | ICD-10-CM | POA: Diagnosis not present

## 2023-08-07 LAB — CBC WITH DIFFERENTIAL (CANCER CENTER ONLY)
Abs Immature Granulocytes: 0.03 10*3/uL (ref 0.00–0.07)
Basophils Absolute: 0 10*3/uL (ref 0.0–0.1)
Basophils Relative: 0 %
Eosinophils Absolute: 0.1 10*3/uL (ref 0.0–0.5)
Eosinophils Relative: 1 %
HCT: 45.3 % (ref 36.0–46.0)
Hemoglobin: 14.4 g/dL (ref 12.0–15.0)
Immature Granulocytes: 1 %
Lymphocytes Relative: 32 %
Lymphs Abs: 1.8 10*3/uL (ref 0.7–4.0)
MCH: 30.6 pg (ref 26.0–34.0)
MCHC: 31.8 g/dL (ref 30.0–36.0)
MCV: 96.4 fL (ref 80.0–100.0)
Monocytes Absolute: 0.5 10*3/uL (ref 0.1–1.0)
Monocytes Relative: 10 %
Neutro Abs: 3.2 10*3/uL (ref 1.7–7.7)
Neutrophils Relative %: 56 %
Platelet Count: 179 10*3/uL (ref 150–400)
RBC: 4.7 MIL/uL (ref 3.87–5.11)
RDW: 14.2 % (ref 11.5–15.5)
WBC Count: 5.6 10*3/uL (ref 4.0–10.5)
nRBC: 0 % (ref 0.0–0.2)

## 2023-08-07 LAB — TOTAL PROTEIN, URINE DIPSTICK: Protein, ur: 30 mg/dL — AB

## 2023-08-07 MED ORDER — DEXAMETHASONE 1 MG PO TABS: 1.0000 mg | ORAL_TABLET | Freq: Every day | ORAL | 0 refills | Status: DC

## 2023-08-07 NOTE — Progress Notes (Signed)
 Palliative Medicine [facility]  [phone] [phone]   Name: [name] [name] Date: 08/07/2023 MRN: [id number]  DOB: 01/07/1966  Patient Care Team: [name] Frees, NP as PCP - General (Family Medicine) [name] Leaver, MD as Referring Physician (Gastroenterology) [name] Mood, MD as Consulting Physician (Oncology) [name] Cao Georgeanna Lea, MD as Consulting Physician (Oncology)    INTERVAL HISTORY: [name] [name] is a 58 y.o. female with medical history of glioblastoma s/p resection (11/2020) [name], [name] [name], [name] [name], [name] [name], [name] hyperlipidemia. Palliative ask to see for symptom management [name] goals of care.   SOCIAL HISTORY:     reports that she has never smoked. She has never used smokeless tobacco. She reports that she does not drink alcohol [name] does not use drugs.  ADVANCE DIRECTIVES:  None on file   CODE STATUS: Full code  PAST MEDICAL HISTORY: Past Medical History:  Diagnosis Date   Bell's palsy    left side of face- notied in smile [name] eyes, if very tired   Brain cancer ([name])    Complication of anesthesia    N/V   Essential [name] [name]    External hemorrhoids    History of blood transfusion    post op Hemorroids   History of pneumonia    Hyperlipidemia    Hyperthyroidism    hx 58 years old- oral meds   Iron deficiency anemia    Migraine headache    Pneumonia    x 2 - years ago   PONV (postoperative nausea [name] vomiting)    PPD positive    Pulmonary nodules    not seen on plain cxr, first detected 1/07 re ct 10/08 pos IPPD > 15 mm 12/09 FOB/lavage 04/20/08 neg afb smear   PVC's (premature ventricular contractions)    Seizure ([name]) 11/15/2020   Sleep apnea    refused CPAP- mouth piece recommended   Tuberculosis    in her late 30's    ALLERGIES:  has no known allergies.  MEDICATIONS:  Current Outpatient Medications  Medication Sig Dispense Refill   amLODipine (NORVASC) 10  MG tablet TAKE 1 TABLET BY MOUTH EVERY DAY 90 tablet 1   aspirin EC 325 MG tablet Take 1 tablet (325 mg total) by mouth daily. 30 tablet 2   dexamethasone (DECADRON) 1 MG tablet Take 1 tablet (1 mg total) by mouth daily. 60 tablet 0   HYDROcodone bit-homatropine (HYCODAN) 5-1.5 MG/5ML syrup Take 5 mLs by mouth every 8 (eight) hours as needed for cough. 120 mL 0   Lacosamide 100 MG TABS Take 2 tablets (200 mg total) by mouth 2 (two) times daily. 120 tablet 2   levETIRAcetam (KEPPRA) 750 MG tablet TAKE 2 TABLETS (1,500 MG TOTAL) BY MOUTH 2 (TWO) TIMES DAILY. 360 tablet 1   levothyroxine (SYNTHROID) 100 MCG tablet TAKE 1 TABLET BY MOUTH EVERY DAY 90 tablet 3   LORazepam (ATIVAN) 2 MG tablet TAKE 1 TABLET (2 MG TOTAL) BY MOUTH EVERY 8 (EIGHT) HOURS AS NEEDED FOR SEIZURE. 12 tablet 0   losartan (COZAAR) 50 MG tablet Take 2 tablets (100 mg total) by mouth daily.     ondansetron (ZOFRAN-ODT) 4 MG disintegrating tablet Take 1 tablet (4 mg total) by mouth every 8 (eight) hours as needed for nausea or vomiting. 10 tablet 0   Potassium Chloride ER 20 MEQ TBCR Take 1 tablet (20 mEq total) by mouth daily. 90 tablet 3   prochlorperazine (COMPAZINE) 10 MG tablet Take 10  mg by mouth every 6 (six) hours as needed for nausea or vomiting.     No current facility-administered medications for this visit.    VITAL SIGNS: BP (!) 176/118 (BP Location: Right Arm, Patient Position: Sitting) Comment: NP notified  Pulse 70  There were no vitals filed for this visit.  Estimated body mass index is 28.41 kg/m as calculated from the following:   Height as of 06/19/23: 5\' 7"  (1.702 m).   Weight as of an earlier encounter on 08/07/23: 181 lb 6.4 oz (82.3 kg).   PERFORMANCE STATUS (ECOG) : 1 - Symptomatic but completely ambulatory   Physical Exam General: NAD Cardiovascular: regular rate [name] rhythm Pulmonary: normal breathing pattern Extremities: no edema, no joint deformities Skin: no rashes Neurological: AAO x3, slow  to respond, left facial impairment   IMPRESSION: Discussed the use of AI scribe software for clinical note transcription with the patient, who gave verbal consent to proceed.  History of Present Illness [name] M Rother "Tj" is a 58 year old female with a history of glioblastoma who presents for ongoing management [name] support. She is accompanied by her cousin. No acute distress. States she is trying to take things one day at a time.   Shares her current treatment with Avastin is on hold per Dr. Barbaraann Cao due to concerns on previous MRI. Family reports she is awaiting a call to schedule her next MRI within the next 1-2 weeks, which is part of her ongoing monitoring [name] assessment of her treatment progress.  She experiences low energy levels, which she attributes to activity levels [name] her cancer. Despite this, she maintains a good appetite [name] adequate sleep. [name] Denies pain or discomfort. Occasional headaches.   Her blood pressure is quite elevated. Family expresses understanding of instructions to increase her medication [name] with plans to schedule PCP follow-up.   All questions answered [name] support provided.    I discussed the importance of continued conversation with family [name] their medical providers regarding overall plan of care [name] treatment options, ensuring decisions are within the context of the patients values [name] GOCs.  Assessment [name] Plan Assessment & Plan Glioblastoma  Previously undergoing Avastin treatment however now on hold due to concerns of recent MRI findings. Regular follow-up with oncologist Dr. Barbaraann Cao. MRI scheduled to re-assess progress. - Coordinate MRI appointment with Dr. Barbaraann Cao as discussed during their visit.  - Provide support [name] address concerns.Patient appreciative of emotional [name] psychological support as she navigates her health.   Follow-up Follow-up in 3-4 weeks. Sooner if needed.  - Remain available for  changes or needs before next appointment.  Patient expressed understanding [name] was in agreement with this plan. She also understands that She can call the clinic at any time with any questions, concerns, or complaints.   Visit consisted of counseling [name] education dealing with the complex [name] emotionally intense issues of symptom management [name] palliative care in the setting of serious [name] potentially life-threatening illness.  Willette Alma, AGPCNP-BC  Palliative Medicine Team/[facility]

## 2023-08-07 NOTE — Progress Notes (Signed)
 [facility] [address]  [place], [place] [address] [phone]   Interval Evaluation  Date of Service: 08/07/23 Patient Name: [name] Patient MRN: [id number] Patient DOB: [birth year] Provider: Henreitta Leber, MD  Identifying Statement:  MAYKAYLA [name] is a 58 y.o. 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)  11/21/2020 Surgery   Left temporal craniotomy, resection with Dr. Maurice Small.  Path is glioblastoma, IDHwt   12/27/2020 - 02/07/2021 Radiation Therapy   IMRT and concurrent Temodar Basilio [place])   03/12/2021 - 08/26/2021 Chemotherapy   Completes 5 cycles of adjuvant 5-day Temodar   08/27/2021 Progression   POD #1   09/06/2021 -  Chemotherapy   Initiates second line therapy; CCNU 90mg /m2 PO q6 weeks and avastin 10mg /kg IV q2 weeks   12/28/2021 Progression   POD #2   01/17/2022 -  Chemotherapy   Third line therapy with Irinotecan and Avastin q2 weeks   11/21/2022 -  Chemotherapy   Transitions to single agent Avastin   05/21/2023 Progression   POD #3   06/18/2023 - 07/01/2023 Radiation Therapy   Re-irradiation 35/10 with concurrent avastin 10mg /kg IV q2 weeks Basilio [place])     Biomarkers:  MGMT Methylated.  IDH 1/2 Wild type.  EGFR Unknown  TERT Unknown   Interval History: [name] presents today for follow up.  Patient and family report no further stroke like symptoms; she continues to have difficulty communicating/speaking.  Her gait is less independent and more of a shuffle, though not needing assistance.  No further seizure events.  She continues on vimpat to 200mg  twice per day.  Decadron at 1mg  daily.    H+P (11/25/20) Patient presented to medical attention this past month with first ever seizure.  She describes episode of sudden onset "disconnection" with tongue biting and urination, followed by period of confusion.   CNS imaging demonstrated enhancing mass within left anterior temporal lobe, which was mostly resected by Dr. Maurice Small on 11/21/20.  Since surgery she had not had recurrence of seizures.  She complains of painful cramping of her right leg, which started ~1 week prior.  At this time the cramping is her main complaint.  Otherwise fully functional, independent, would like to return to work if possible.  Medications: Current Outpatient Medications on File Prior to Visit  Medication Sig Dispense Refill   amLODipine (NORVASC) 10 MG tablet TAKE 1 TABLET BY MOUTH EVERY DAY 90 tablet 1   aspirin EC 325 MG tablet Take 1 tablet (325 mg total) by mouth daily. 30 tablet 2   dexamethasone (DECADRON) 1 MG tablet Take 1 tablet (1 mg total) by mouth daily. 30 tablet 0   HYDROcodone bit-homatropine (HYCODAN) 5-1.5 MG/5ML syrup Take 5 mLs by mouth every 8 (eight) hours as needed for cough. 120 mL 0   Lacosamide 100 MG TABS Take 2 tablets (200 mg total) by mouth 2 (two) times daily. 120 tablet 2   levETIRAcetam (KEPPRA) 750 MG tablet TAKE 2 TABLETS (1,500 MG TOTAL) BY MOUTH 2 (TWO) TIMES DAILY. 360 tablet 1   levothyroxine (SYNTHROID) 100 MCG tablet TAKE 1 TABLET BY MOUTH EVERY DAY 90 tablet 3   LORazepam (ATIVAN) 2 MG tablet TAKE 1 TABLET (2 MG TOTAL) BY MOUTH EVERY 8 (EIGHT) HOURS AS NEEDED FOR SEIZURE. 12 tablet 0   losartan (COZAAR) 50 MG tablet Take 2 tablets (100 mg total) by mouth daily.  ondansetron (ZOFRAN-ODT) 4 MG disintegrating tablet Take 1 tablet (4 mg total) by mouth every 8 (eight) hours as needed for nausea or vomiting. 10 tablet 0   Potassium Chloride ER 20 MEQ TBCR Take 1 tablet (20 mEq total) by mouth daily. 90 tablet 3   prochlorperazine (COMPAZINE) 10 MG tablet Take 10 mg by mouth every 6 (six) hours as needed for nausea or vomiting.     No current facility-administered medications on file prior to visit.    Allergies: No Known Allergies Past Medical History:  Past Medical History:   Diagnosis Date   Bell's palsy    left side of face- notied in smile and eyes, if very tired   Brain cancer ([name])    Complication of anesthesia    N/V   Essential hypertension    External hemorrhoids    History of blood transfusion    post op Hemorroids   History of pneumonia    Hyperlipidemia    Hyperthyroidism    hx 58 years old- oral meds   Iron deficiency anemia    Migraine headache    Pneumonia    x 2 - years ago   PONV (postoperative nausea and vomiting)    PPD positive    Pulmonary nodules    not seen on plain cxr, first detected 1/07 re ct 10/08 pos IPPD > 15 mm 12/09 FOB/lavage 04/20/08 neg afb smear   PVC's (premature ventricular contractions)    Seizure ([name]) 11/15/2020   Sleep apnea    refused CPAP- mouth piece recommended   Tuberculosis    in her late 30's   Past Surgical History:  Past Surgical History:  Procedure Laterality Date   ABDOMINAL HYSTERECTOMY     APPLICATION OF CRANIAL NAVIGATION N/A 11/21/2020   Procedure: APPLICATION OF CRANIAL NAVIGATION;  Surgeon: Jadene Pierini, MD;  Location: [facility] OR;  Service: Neurosurgery;  Laterality: N/A;   BREAST BIOPSY Right 2014   BREAST BIOPSY Left 10/29/2019   CHOLECYSTECTOMY  2014   COLONOSCOPY  05/04/2009   prolapsing external hemorrhoids   CRANIOTOMY Left 11/21/2020   Procedure: Left craniotomy for tumor resection;  Surgeon: Jadene Pierini, MD;  Location: [facility] OR;  Service: Neurosurgery;  Laterality: Left;   ENDOMETRIAL ABLATION     HEMORRHOID SURGERY     x 3   TUBAL LIGATION     Social History:  Social History   Socioeconomic History   Marital status: Divorced    Spouse name: Not on file   Number of children: 2   Years of education: Not on file   Highest education level: Associate degree: occupational, Scientist, product/process development, or vocational program  Occupational History   Occupation: accounts IT trainer: [employer]  Tobacco Use   Smoking status: Never   Smokeless tobacco: Never  Vaping Use    Vaping status: Never Used  Substance and Sexual Activity   Alcohol use: No    Alcohol/week: 0.0 standard drinks of alcohol   Drug use: No   Sexual activity: Not on file  Other Topics Concern   Not on file  Social History Narrative   Divorced with 2 children   She works as a Merchandiser, retail in Audiological [employer]    Possible TB exposure 2008 (after nodules found)   From [place] lived there and [place]   Never owned birds      Social Drivers of Health   Financial Resource Strain: Medium Risk (06/13/2021)   Overall Financial Resource Strain (CARDIA)  Difficulty of Paying Living Expenses: Somewhat hard  Food Insecurity: Food Insecurity Present (06/03/2023)   Hunger Vital Sign    Worried About Running Out of Food in the Last Year: Sometimes true    Ran Out of Food in the Last Year: Sometimes true  Transportation Needs: No Transportation Needs (06/03/2023)   PRAPARE - Administrator, Civil Service (Medical): No    Lack of Transportation (Non-Medical): No  Physical Activity: Unknown (04/23/2021)   Exercise Vital Sign    Days of Exercise per Week: 0 days    Minutes of Exercise per Session: Not on file  Stress: No Stress Concern Present (04/23/2021)   [employer] of Occupational Health - Occupational Stress Questionnaire    Feeling of Stress : Only a little  Social Connections: Unknown (09/16/2021)   Received from [facility]   Social Network    Social Network: Not on file  Intimate Partner Violence: Not At Risk (06/03/2023)   Humiliation, Afraid, Rape, and Kick questionnaire    Fear of Current or Ex-Partner: No    Emotionally Abused: No    Physically Abused: No    Sexually Abused: No   Family History:  Family History  Problem Relation Age of Onset   Diabetes type II Mother    Kidney failure Sister    Diabetes Mellitus II Sister    Cancer Maternal Aunt        Stomach cancer   Colon cancer Maternal Aunt        aunts and uncles   Diabetes Mellitus II Maternal Aunt     Pancreatic cancer Maternal Aunt    Colon cancer Maternal Uncle    Diabetes Mellitus II Maternal Uncle    Heart disease Maternal Grandmother        aunts, uncles, sister   Diabetes Mellitus II Maternal Grandmother    Lung cancer Other        aunts and uncles    Review of Systems: Constitutional: poor appetite Eyes: blurriness of vision Ears, nose, mouth, throat, and face: Doesn't report sore throat Respiratory: Doesn't report cough, dyspnea or wheezes Cardiovascular: Doesn't report palpitation, chest discomfort  Gastrointestinal:  Doesn't report nausea, constipation, diarrhea GU: Doesn't report incontinence Skin: Doesn't report skin rashes Neurological: Per HPI Musculoskeletal: bilateral shoulder pain Behavioral/Psych: +anxiety  Physical Exam: Vitals:   08/07/23 1420 08/07/23 1421  BP: (!) 175/117 (!) 175/109  Pulse: 74   Resp: 17   Temp: 98.2 F (36.8 C)   SpO2: 98%      KPS: 70. General: Alert, cooperative, pleasant, in no acute distress Head: Normal EENT: No conjunctival injection or scleral icterus.  Lungs: Resp effort normal Cardiac: Regular rate Abdomen: Non-distended abdomen Skin: No rashes cyanosis or petechiae. Extremities: No clubbing or edema  Neurologic Exam: Mental Status: Awake, alert, attentive to examiner. Oriented to self and environment. Language is notable for impairment in fluency.  Comprehension limited with multi-step commands.  Impaired short term recall. Cranial Nerves: Visual acuity is grossly normal. Visual fields are full. Extra-ocular movements intact. No ptosis. Face L LMN paresis, chronic. Motor: Tone and bulk are normal. Power is full in both arms and legs. Reflexes are symmetric, no pathologic reflexes present.  Sensory: Intact to light touch Gait: Dystaxic  Labs: I have reviewed the data as listed    Component Value Date/Time   NA 137 03/27/2023 1211   K 3.7 03/27/2023 1211   CL 103 03/27/2023 1211   CO2 26 03/27/2023 1211  GLUCOSE 129 (H) 03/27/2023 1211   BUN 11 03/27/2023 1211   CREATININE 1.05 (H) 03/27/2023 1211   CREATININE 0.92 01/25/2020 1424   CALCIUM 10.1 03/27/2023 1211   PROT 8.0 03/27/2023 1211   ALBUMIN 4.5 03/27/2023 1211   AST 17 03/27/2023 1211   ALT 15 03/27/2023 1211   ALKPHOS 141 (H) 03/27/2023 1211   BILITOT 1.6 (H) 03/27/2023 1211   GFRNONAA >60 03/27/2023 1211   GFRNONAA 72 01/25/2020 1424   GFRAA 83 01/25/2020 1424   Lab Results  Component Value Date   WBC 5.6 08/07/2023   NEUTROABS 3.2 08/07/2023   HGB 14.4 08/07/2023   HCT 45.3 08/07/2023   MCV 96.4 08/07/2023   PLT 179 08/07/2023    Imaging:  [facility] Clinician Interpretation: I have personally reviewed the CNS images as listed.  My interpretation, in the context of the patient's clinical presentation, is  stroke vs recurrent tumor  MR BRAIN W WO CONTRAST Result Date: 07/17/2023 CLINICAL DATA:  Brain/CNS neoplasm, assess treatment response. EXAM: MRI HEAD WITHOUT AND WITH CONTRAST TECHNIQUE: Multiplanar, multiecho pulse sequences of the brain and surrounding structures were obtained without and with intravenous contrast. CONTRAST:  10mL GADAVIST GADOBUTROL 1 MMOL/ML IV SOLN COMPARISON:  MRI brain 05/21/2023. FINDINGS: Brain: There is a new 2.1 x 1.0 cm focus of restricted diffusion centered in the body of the left caudate nucleus and left corona radiata with partial extension into the internal capsule and lentiform nuclei. There is minimal stippled enhancement throughout this region (series 16, image 91). Mild associated edema without significant mass effect. Just posterior and inferior to the region of diffusion signal abnormality in the lentiform nuclei there is a new region of nodular enhancement involving the posterior left lentiform nuclei best appreciated on axial image 74/160. Intrinsic T1 signal abnormality in the anterior inferior left frontal lobe which is decreased in size from prior with minimal associated enhancement.  Similar edema in this region. Postsurgical changes of left temporal craniotomy with subjacent resection cavity in the left middle cranial fossa. Redemonstration of diffusion signal abnormality involving the left temporal stem white matter similar to prior. Intrinsic T1 signal abnormality along the left temporal white matter and left temporal horn with minimal associated nodular enhancement which is slightly decreased from prior. Additional areas of diffusion signal abnormality in the left periatrial white matter and involving the left splenium of the corpus callosum again noted (series 5, images 32 and 24). No evidence of acute intracranial hemorrhage. White matter signal abnormality suggestive of underlying chronic microvascular ischemic changes. No midline shift. No extra-axial fluid collections. The basilar cisterns are patent. Vascular: Normal flow voids. Skull and upper cervical spine: Normal marrow signal. Sinuses/Orbits: Negative. Other: None. IMPRESSION: 1. New 2.1 x 1.0 cm focus of restricted diffusion centered in the body of the left caudate nucleus and left corona radiata with associated minimal enhancement. Additional areas of nodular enhancement in the posterior left lentiform nuclei are new from prior. Findings concerning for a new site of intracranial neoplasm involvement. However, given the relative improvement in disease burden elsewhere in the brain finding could reflect subacute infarct although this is considered less likely. Recommend short interval follow-up MRI. 2. Decreased size and extent of enhancement in the anterior inferior left frontal lobe and along the left temporal horn with residual intrinsic T1 signal abnormality. 3. Postsurgical changes of left temporal craniotomy with similar subjacent resection cavity in the left middle cranial fossa. Electronically Signed   By: Emily Filbert M.D.   On:  07/17/2023 19:55   Assessment/Plan Glioblastoma with isocitrate dehydrogenase gene  wildtype ([name])  Focal seizures ([name])  Ferrin SANYA KOBRIN is clinically stable today, with noted expressive dysphasia.  No new complaints today.  Prior MRI brain demonstrated either recurrence of tumor within left subcortex, or stroke.  Stroke would be considered a potential complication of avastin.  She has been dosing aspirin 325mg  daily as instructed.  Blood pressure remains elevated.  She did not increase the Losartan after prior visit as discussed.  Recommended she increase to 100mg  daily and follow up promptly with PCP for additional titration.  We recommended obtaining a blood pressure cuff for home values as well.  We otherwise recommended repeating a brain MRI in 1-2 weeks, which would fully identify the etiology of this new focus of DWI signal and enhancement.  In the meantime, because of potential stroke and elevated blood pressure, we will defer upcoming avastin infusion.  Will re-evaluate following the next MRI.  Decadron may con't 1mg  daily for now.  For seizures, will con't Vimpat 200mg  BID.  Keppra will also continue 1000mg  BID.  All questions were answered. The patient knows to call the clinic with any problems, questions or concerns. No barriers to learning were detected.  The total time spent in the encounter was 30 minutes and more than 50% was on counseling and review of test results   Henreitta Leber, MD Medical Director of Neuro-Oncology [facility] at [facility] 08/07/23 2:14 PM

## 2023-08-11 ENCOUNTER — Other Ambulatory Visit: Payer: Self-pay | Admitting: *Deleted

## 2023-08-11 NOTE — Progress Notes (Signed)
 Brother Chrissie Noa requested a letter advising that patient can increase her hours at work from 20 to 30 hours to keep her LTD and STD.  Patient is already accommodated at work and can work the increased hours.   Letter prepared and faxed to [email or web address] after signed by Dr Barbaraann Cao.

## 2023-08-14 ENCOUNTER — Ambulatory Visit (HOSPITAL_COMMUNITY)
Admission: RE | Admit: 2023-08-14 | Discharge: 2023-08-14 | Disposition: A | Discharge status: Home / Self Care | Source: Ambulatory Visit | Attending: Internal Medicine | Admitting: Internal Medicine

## 2023-08-14 DIAGNOSIS — C719 Malignant neoplasm of brain, unspecified: Secondary | ICD-10-CM | POA: Diagnosis not present

## 2023-08-14 DIAGNOSIS — R930 Abnormal findings on diagnostic imaging of skull and head, not elsewhere classified: Secondary | ICD-10-CM | POA: Diagnosis not present

## 2023-08-14 MED ORDER — GADOBUTROL 1 MMOL/ML IV SOLN
8.0000 mL | Freq: Once | INTRAVENOUS | Status: AC | PRN
  Administered 2023-08-14: 8 mL via INTRAVENOUS

## 2023-08-15 ENCOUNTER — Other Ambulatory Visit: Payer: Self-pay | Admitting: Internal Medicine

## 2023-08-15 DIAGNOSIS — F445 Conversion disorder with seizures or convulsions: Secondary | ICD-10-CM | POA: Diagnosis not present

## 2023-08-15 DIAGNOSIS — C719 Malignant neoplasm of brain, unspecified: Secondary | ICD-10-CM | POA: Diagnosis not present

## 2023-08-18 ENCOUNTER — Other Ambulatory Visit: Payer: Self-pay | Admitting: *Deleted

## 2023-08-18 ENCOUNTER — Inpatient Hospital Stay: Admitting: Internal Medicine

## 2023-08-18 ENCOUNTER — Inpatient Hospital Stay

## 2023-08-18 DIAGNOSIS — C719 Malignant neoplasm of brain, unspecified: Secondary | ICD-10-CM

## 2023-08-19 ENCOUNTER — Inpatient Hospital Stay: Admitting: Internal Medicine

## 2023-08-19 ENCOUNTER — Inpatient Hospital Stay

## 2023-08-19 VITALS — BP 149/93 | HR 98 | Temp 97.8°F | Resp 16 | Wt 181.7 lb

## 2023-08-19 DIAGNOSIS — C719 Malignant neoplasm of brain, unspecified: Secondary | ICD-10-CM

## 2023-08-19 DIAGNOSIS — C712 Malignant neoplasm of temporal lobe: Secondary | ICD-10-CM | POA: Diagnosis not present

## 2023-08-19 DIAGNOSIS — Z923 Personal history of irradiation: Secondary | ICD-10-CM | POA: Diagnosis not present

## 2023-08-19 DIAGNOSIS — Z800 Family history of malignant neoplasm of digestive organs: Secondary | ICD-10-CM

## 2023-08-19 DIAGNOSIS — R569 Unspecified convulsions: Secondary | ICD-10-CM | POA: Diagnosis not present

## 2023-08-19 DIAGNOSIS — Z79899 Other long term (current) drug therapy: Secondary | ICD-10-CM | POA: Diagnosis not present

## 2023-08-19 DIAGNOSIS — R4702 Dysphasia: Secondary | ICD-10-CM | POA: Diagnosis not present

## 2023-08-19 DIAGNOSIS — Z9221 Personal history of antineoplastic chemotherapy: Secondary | ICD-10-CM | POA: Diagnosis not present

## 2023-08-19 DIAGNOSIS — Z801 Family history of malignant neoplasm of trachea, bronchus and lung: Secondary | ICD-10-CM

## 2023-08-19 DIAGNOSIS — I10 Essential (primary) hypertension: Secondary | ICD-10-CM | POA: Diagnosis not present

## 2023-08-19 LAB — CMP (CANCER CENTER ONLY)
ALT: 12 U/L (ref 0–44)
AST: 14 U/L — ABNORMAL LOW (ref 15–41)
Albumin: 4.6 g/dL (ref 3.5–5.0)
Alkaline Phosphatase: 122 U/L (ref 38–126)
Anion gap: 8 (ref 5–15)
BUN: 16 mg/dL (ref 6–20)
CO2: 26 mmol/L (ref 22–32)
Calcium: 9.9 mg/dL (ref 8.9–10.3)
Chloride: 106 mmol/L (ref 98–111)
Creatinine: 1.04 mg/dL — ABNORMAL HIGH (ref 0.44–1.00)
GFR, Estimated: >60 mL/min (ref >60)
Glucose, Bld: 104 mg/dL — ABNORMAL HIGH (ref 70–99)
Potassium: 3.8 mmol/L (ref 3.5–5.1)
Sodium: 140 mmol/L (ref 135–145)
Total Bilirubin: 1.1 mg/dL (ref 0.0–1.2)
Total Protein: 7.8 g/dL (ref 6.5–8.1)

## 2023-08-19 LAB — CBC WITH DIFFERENTIAL (CANCER CENTER ONLY)
Abs Immature Granulocytes: 0.03 10*3/uL (ref 0.00–0.07)
Basophils Absolute: 0 10*3/uL (ref 0.0–0.1)
Basophils Relative: 1 %
Eosinophils Absolute: 0.1 10*3/uL (ref 0.0–0.5)
Eosinophils Relative: 1 %
HCT: 47.6 % — ABNORMAL HIGH (ref 36.0–46.0)
Hemoglobin: 15.5 g/dL — ABNORMAL HIGH (ref 12.0–15.0)
Immature Granulocytes: 1 %
Lymphocytes Relative: 24 %
Lymphs Abs: 1.5 10*3/uL (ref 0.7–4.0)
MCH: 31 pg (ref 26.0–34.0)
MCHC: 32.6 g/dL (ref 30.0–36.0)
MCV: 95.2 fL (ref 80.0–100.0)
Monocytes Absolute: 0.6 10*3/uL (ref 0.1–1.0)
Monocytes Relative: 9 %
Neutro Abs: 4.1 10*3/uL (ref 1.7–7.7)
Neutrophils Relative %: 64 %
Platelet Count: 203 10*3/uL (ref 150–400)
RBC: 5 MIL/uL (ref 3.87–5.11)
RDW: 15 % (ref 11.5–15.5)
WBC Count: 6.3 10*3/uL (ref 4.0–10.5)
nRBC: 0 % (ref 0.0–0.2)

## 2023-08-19 MED ORDER — HYDROCORTISONE 10 MG PO TABS: 10.0000 mg | ORAL_TABLET | Freq: Every day | ORAL | Status: DC

## 2023-08-19 NOTE — Progress Notes (Signed)
 [facility] at [facility] [address]  [place], [place] [address] [phone]   Interval Evaluation  Date of Service: 08/19/23 Patient Name: CATALYNA REILLY Patient MRN: [id number] Patient DOB: 17-Feb-1966 Provider: Henreitta Leber, MD  Identifying Statement:  DENIECE RANKIN is a 58 y.o. 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)  11/21/2020 Surgery   Left temporal craniotomy, resection with Dr. Maurice Small.  Path is glioblastoma, IDHwt   12/27/2020 - 02/07/2021 Radiation Therapy   IMRT and concurrent Temodar Basilio [place])   03/12/2021 - 08/26/2021 Chemotherapy   Completes 5 cycles of adjuvant 5-day Temodar   08/27/2021 Progression   POD #1   09/06/2021 -  Chemotherapy   Initiates second line therapy; CCNU 90mg /m2 PO q6 weeks and avastin 10mg /kg IV q2 weeks   12/28/2021 Progression   POD #2   01/17/2022 -  Chemotherapy   Third line therapy with Irinotecan and Avastin q2 weeks   11/21/2022 -  Chemotherapy   Transitions to single agent Avastin   05/21/2023 Progression   POD #3   06/18/2023 - 07/01/2023 Radiation Therapy   Re-irradiation 35/10 with concurrent avastin 10mg /kg IV q2 weeks Basilio [place])     Biomarkers:  MGMT Methylated.  IDH 1/2 Wild type.  EGFR Unknown  TERT Unknown   Interval History: NIKIAH GOIN presents today for follow up.  Patient and family report no further stroke like symptoms; she continues to have difficulty communicating/speaking.  Her gait is less independent and more of a shuffle, though not needing assistance.  No further seizure events.  She continues on vimpat to 200mg  twice per day.  Decadron at 1mg  daily.    H+P (11/25/20) Patient presented to medical attention this past month with first ever seizure.  She describes episode of sudden onset "disconnection" with tongue biting and urination, followed by period of confusion.   CNS imaging demonstrated enhancing mass within left anterior temporal lobe, which was mostly resected by Dr. Maurice Small on 11/21/20.  Since surgery she had not had recurrence of seizures.  She complains of painful cramping of her right leg, which started ~1 week prior.  At this time the cramping is her main complaint.  Otherwise fully functional, independent, would like to return to work if possible.  Medications: Current Outpatient Medications on File Prior to Visit  Medication Sig Dispense Refill   amLODipine (NORVASC) 10 MG tablet TAKE 1 TABLET BY MOUTH EVERY DAY 90 tablet 1   aspirin EC 325 MG tablet Take 1 tablet (325 mg total) by mouth daily. 30 tablet 2   HYDROcodone bit-homatropine (HYCODAN) 5-1.5 MG/5ML syrup Take 5 mLs by mouth every 8 (eight) hours as needed for cough. 120 mL 0   Lacosamide 100 MG TABS Take 2 tablets (200 mg total) by mouth 2 (two) times daily. 120 tablet 2   levETIRAcetam (KEPPRA) 750 MG tablet TAKE 2 TABLETS (1,500 MG TOTAL) BY MOUTH 2 (TWO) TIMES DAILY. 360 tablet 1   levothyroxine (SYNTHROID) 100 MCG tablet TAKE 1 TABLET BY MOUTH EVERY DAY 90 tablet 3   LORazepam (ATIVAN) 2 MG tablet TAKE 1 TABLET (2 MG TOTAL) BY MOUTH EVERY 8 (EIGHT) HOURS AS NEEDED FOR SEIZURE. 12 tablet 0   losartan (COZAAR) 50 MG tablet Take 2 tablets (100 mg total) by mouth daily.     ondansetron (ZOFRAN-ODT) 4 MG disintegrating tablet Take 1 tablet (4 mg total) by mouth every 8 (eight) hours  as needed for nausea or vomiting. 10 tablet 0   Potassium Chloride ER 20 MEQ TBCR Take 1 tablet (20 mEq total) by mouth daily. 90 tablet 3   prochlorperazine (COMPAZINE) 10 MG tablet Take 10 mg by mouth every 6 (six) hours as needed for nausea or vomiting.     No current facility-administered medications on file prior to visit.    Allergies: No Known Allergies Past Medical History:  Past Medical History:  Diagnosis Date   Bell's palsy    left side of face- notied in smile and eyes, if very tired    Brain cancer ([name])    Complication of anesthesia    N/V   Essential hypertension    External hemorrhoids    History of blood transfusion    post op Hemorroids   History of pneumonia    Hyperlipidemia    Hyperthyroidism    hx 58 years old- oral meds   Iron deficiency anemia    Migraine headache    Pneumonia    x 2 - years ago   PONV (postoperative nausea and vomiting)    PPD positive    Pulmonary nodules    not seen on plain cxr, first detected 1/07 re ct 10/08 pos IPPD > 15 mm 12/09 FOB/lavage 04/20/08 neg afb smear   PVC's (premature ventricular contractions)    Seizure ([name]) 11/15/2020   Sleep apnea    refused CPAP- mouth piece recommended   Tuberculosis    in her late 30's   Past Surgical History:  Past Surgical History:  Procedure Laterality Date   ABDOMINAL HYSTERECTOMY     APPLICATION OF CRANIAL NAVIGATION N/A 11/21/2020   Procedure: APPLICATION OF CRANIAL NAVIGATION;  Surgeon: Cannon Champion, MD;  Location: [facility] OR;  Service: Neurosurgery;  Laterality: N/A;   BREAST BIOPSY Right 2014   BREAST BIOPSY Left 10/29/2019   CHOLECYSTECTOMY  2014   COLONOSCOPY  05/04/2009   prolapsing external hemorrhoids   CRANIOTOMY Left 11/21/2020   Procedure: Left craniotomy for tumor resection;  Surgeon: Cannon Champion, MD;  Location: [facility] OR;  Service: Neurosurgery;  Laterality: Left;   ENDOMETRIAL ABLATION     HEMORRHOID SURGERY     x 3   TUBAL LIGATION     Social History:  Social History   Socioeconomic History   Marital status: Divorced    Spouse name: Not on file   Number of children: 2   Years of education: Not on file   Highest education level: Associate degree: occupational, Scientist, product/process development, or vocational program  Occupational History   Occupation: accounts IT trainer: [employer]  Tobacco Use   Smoking status: Never   Smokeless tobacco: Never  Vaping Use   Vaping status: Never Used  Substance and Sexual Activity   Alcohol use: No    Alcohol/week: 0.0  standard drinks of alcohol   Drug use: No   Sexual activity: Not on file  Other Topics Concern   Not on file  Social History Narrative   Divorced with 2 children   She works as a Merchandiser, retail in [employer]    Possible TB exposure 2008 (after nodules found)   From [place] lived there and [place]   Never owned birds      Social Drivers of Health   Financial Resource Strain: Medium Risk (06/13/2021)   Overall Financial Resource Strain (CARDIA)    Difficulty of Paying Living Expenses: Somewhat hard  Food Insecurity: Food Insecurity Present (06/03/2023)   Hunger Vital  Sign    Worried About Programme researcher, broadcasting/film/video in the Last Year: Sometimes true    Ran Out of Food in the Last Year: Sometimes true  Transportation Needs: No Transportation Needs (06/03/2023)   PRAPARE - Administrator, Civil Service (Medical): No    Lack of Transportation (Non-Medical): No  Physical Activity: Unknown (04/23/2021)   Exercise Vital Sign    Days of Exercise per Week: 0 days    Minutes of Exercise per Session: Not on file  Stress: No Stress Concern Present (04/23/2021)   [employer] of Occupational Health - Occupational Stress Questionnaire    Feeling of Stress : Only a little  Social Connections: Unknown (09/16/2021)   Received from [facility], [facility]   Social Network    Social Network: Not on file  Intimate Partner Violence: Not At Risk (06/03/2023)   Humiliation, Afraid, Rape, and Kick questionnaire    Fear of Current or Ex-Partner: No    Emotionally Abused: No    Physically Abused: No    Sexually Abused: No   Family History:  Family History  Problem Relation Age of Onset   Diabetes type II Mother    Kidney failure Sister    Diabetes Mellitus II Sister    Cancer Maternal Aunt        Stomach cancer   Colon cancer Maternal Aunt        aunts and uncles   Diabetes Mellitus II Maternal Aunt    Pancreatic cancer Maternal Aunt    Colon cancer Maternal Uncle    Diabetes Mellitus II  Maternal Uncle    Heart disease Maternal Grandmother        aunts, uncles, sister   Diabetes Mellitus II Maternal Grandmother    Lung cancer Other        aunts and uncles    Review of Systems: Constitutional: poor appetite Eyes: blurriness of vision Ears, nose, mouth, throat, and face: Doesn't report sore throat Respiratory: Doesn't report cough, dyspnea or wheezes Cardiovascular: Doesn't report palpitation, chest discomfort  Gastrointestinal:  Doesn't report nausea, constipation, diarrhea GU: Doesn't report incontinence Skin: Doesn't report skin rashes Neurological: Per HPI Musculoskeletal: bilateral shoulder pain Behavioral/Psych: +anxiety  Physical Exam: Vitals:   08/19/23 1056  BP: (!) 149/93  Pulse: 98  Resp: 16  Temp: 97.8 F (36.6 C)  SpO2: 97%     KPS: 70. General: Alert, cooperative, pleasant, in no acute distress Head: Normal EENT: No conjunctival injection or scleral icterus.  Lungs: Resp effort normal Cardiac: Regular rate Abdomen: Non-distended abdomen Skin: No rashes cyanosis or petechiae. Extremities: No clubbing or edema  Neurologic Exam: Mental Status: Awake, alert, attentive to examiner. Oriented to self and environment. Language is notable for impairment in fluency.  Comprehension limited with multi-step commands.  Impaired short term recall. Cranial Nerves: Visual acuity is grossly normal. Visual fields are full. Extra-ocular movements intact. No ptosis. Face L LMN paresis, chronic. Motor: Tone and bulk are normal. Power is full in both arms and legs. Reflexes are symmetric, no pathologic reflexes present.  Sensory: Intact to light touch Gait: Dystaxic  Labs: I have reviewed the data as listed    Component Value Date/Time   NA 140 08/19/2023 1033   K 3.8 08/19/2023 1033   CL 106 08/19/2023 1033   CO2 26 08/19/2023 1033   GLUCOSE 104 (H) 08/19/2023 1033   BUN 16 08/19/2023 1033   CREATININE 1.04 (H) 08/19/2023 1033   CREATININE 0.92  01/25/2020 1424  CALCIUM 9.9 08/19/2023 1033   PROT 7.8 08/19/2023 1033   ALBUMIN 4.6 08/19/2023 1033   AST 14 (L) 08/19/2023 1033   ALT 12 08/19/2023 1033   ALKPHOS 122 08/19/2023 1033   BILITOT 1.1 08/19/2023 1033   GFRNONAA >60 08/19/2023 1033   GFRNONAA 72 01/25/2020 1424   GFRAA 83 01/25/2020 1424   Lab Results  Component Value Date   WBC 6.3 08/19/2023   NEUTROABS 4.1 08/19/2023   HGB 15.5 (H) 08/19/2023   HCT 47.6 (H) 08/19/2023   MCV 95.2 08/19/2023   PLT 203 08/19/2023    Imaging:  [facility] Clinician Interpretation: I have personally reviewed the CNS images as listed.  My interpretation, in the context of the patient's clinical presentation, is  stroke vs recurrent tumor , favor the former.  Official read pending.  No results found.  Assessment/Plan 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) - Plan: MR BRAIN W WO CONTRAST  Focal seizures ([name])  Shahrzad Melven Stable Bilotta is clinically stable today.  MRI brain demonstrates stable findings, with some diminishing of DWI signal within left subcortex.  This is more consistent with stroke than recurrent tumor.  Pattern of enhancement also more c/w stroke.  Official read is pending.  Recommended remaining off systemic therapy, avastin, and continuing imaging surveillance only at this time.  Blood pressure is improved with avastin washout, Losartan incresed to 100mg  daily.  Hydrocortisone may decrease to 10mg  daily.  For seizures, will con't Vimpat 200mg  BID.  Keppra will also continue 1000mg  BID.  All questions were answered. The patient knows to call the clinic with any problems, questions or concerns. No barriers to learning were detected.  We ask that THERESA WEDEL return to clinic in 2 months following next brain MRI, or sooner as needed.  The total time spent in the encounter was 40 minutes and more than 50% was on counseling and review of test results   Mamie Searles, MD Medical Director of  Neuro-Oncology [facility] at [facility] [place] [facility] 08/19/23 11:57 AM

## 2023-08-20 ENCOUNTER — Telehealth: Payer: Self-pay | Admitting: Internal Medicine

## 2023-08-20 NOTE — Telephone Encounter (Signed)
 Phil scheduled [name]'s follow up appointment.

## 2023-09-11 ENCOUNTER — Other Ambulatory Visit: Payer: Self-pay | Admitting: Internal Medicine

## 2023-09-12 ENCOUNTER — Other Ambulatory Visit: Payer: Self-pay | Admitting: *Deleted

## 2023-09-12 DIAGNOSIS — C719 Malignant neoplasm of brain, unspecified: Secondary | ICD-10-CM

## 2023-09-12 MED ORDER — HYDROCORTISONE 10 MG PO TABS
10.0000 mg | ORAL_TABLET | Freq: Every day | ORAL | 0 refills | Status: DC
Start: 2023-09-12 — End: 2023-10-15

## 2023-10-02 ENCOUNTER — Ambulatory Visit (INDEPENDENT_AMBULATORY_CARE_PROVIDER_SITE_OTHER): Admitting: Adult Health

## 2023-10-02 VITALS — BP 110/80 | HR 87 | Temp 98.3°F | Ht 67.0 in | Wt 177.0 lb

## 2023-10-02 DIAGNOSIS — R5383 Other fatigue: Secondary | ICD-10-CM | POA: Diagnosis not present

## 2023-10-02 DIAGNOSIS — E039 Hypothyroidism, unspecified: Secondary | ICD-10-CM | POA: Diagnosis not present

## 2023-10-02 LAB — VITAMIN B12: Vitamin B-12: 260 pg/mL (ref 211–911)

## 2023-10-02 LAB — VITAMIN D 25 HYDROXY (VIT D DEFICIENCY, FRACTURES): VITD: 24.75 ng/mL — ABNORMAL LOW (ref 30.00–100.00)

## 2023-10-02 NOTE — Progress Notes (Signed)
 Subjective:    Patient ID: [name], female    DOB: 10/04/1965, 58 y.o.   MRN: [id number]  HPI 58 year old female who  has a past medical history of Bell's palsy, Brain cancer ([name]), Complication of anesthesia, Essential hypertension, External hemorrhoids, History of blood transfusion, History of pneumonia, Hyperlipidemia, Hyperthyroidism, Iron deficiency anemia, Migraine headache, Pneumonia, PONV (postoperative nausea and vomiting), PPD positive, Pulmonary nodules, PVC's (premature ventricular contractions), Seizure ([name]) (11/15/2020), Sleep apnea, and Tuberculosis.  She presents to the office today with her daughter and her husband who is on the phone.  Family reports that the patient has been "extremely fatigued"  to the point where it is hard for her to hold up her head at times.  Work was done by her oncologist on 07/24/2023 which showed a TSH of 10.325, a week prior her TSH was 2.761 and roughly 2 weeks before that her TSH was 14.751.  Daughter reports that she is taking her Synthroid  100 mcg but has been taking it in the afternoon.   Review of Systems See HPI   Past Medical History:  Diagnosis Date   Bell's palsy    left side of face- notied in smile and eyes, if very tired   Brain cancer ([name])    Complication of anesthesia    N/V   Essential hypertension    External hemorrhoids    History of blood transfusion    post op Hemorroids   History of pneumonia    Hyperlipidemia    Hyperthyroidism    hx 58 years old- oral meds   Iron deficiency anemia    Migraine headache    Pneumonia    x 2 - years ago   PONV (postoperative nausea and vomiting)    PPD positive    Pulmonary nodules    not seen on plain cxr, first detected 1/07 re ct 10/08 pos IPPD > 15 mm 12/09 FOB/lavage 04/20/08 neg afb smear   PVC's (premature ventricular contractions)    Seizure ([name]) 11/15/2020   Sleep apnea    refused CPAP- mouth piece recommended   Tuberculosis    in her late 48's     Social History   Socioeconomic History   Marital status: Divorced    Spouse name: Not on file   Number of children: 2   Years of education: Not on file   Highest education level: Associate degree: occupational, Scientist, product/process development, or vocational program  Occupational History   Occupation: accounts IT trainer: [employer]  Tobacco Use   Smoking status: Never   Smokeless tobacco: Never  Vaping Use   Vaping status: Never Used  Substance and Sexual Activity   Alcohol use: No    Alcohol/week: 0.0 standard drinks of alcohol   Drug use: No   Sexual activity: Not on file  Other Topics Concern   Not on file  Social History Narrative   Divorced with 2 children   She works as a Merchandiser, retail in [employer]    Possible TB exposure 2008 (after nodules found)   From [place] lived there and [place]   Never owned birds      Social Drivers of Health   Financial Resource Strain: Medium Risk (06/13/2021)   Overall Financial Resource Strain (CARDIA)    Difficulty of Paying Living Expenses: Somewhat hard  Food Insecurity: Food Insecurity Present (06/03/2023)   Hunger Vital Sign    Worried About Running Out of Food in the Last Year: Sometimes true  Ran Out of Food in the Last Year: Sometimes true  Transportation Needs: No Transportation Needs (06/03/2023)   PRAPARE - Administrator, Civil Service (Medical): No    Lack of Transportation (Non-Medical): No  Physical Activity: Unknown (04/23/2021)   Exercise Vital Sign    Days of Exercise per Week: 0 days    Minutes of Exercise per Session: Not on file  Stress: No Stress Concern Present (04/23/2021)   [employer] of Occupational Health - Occupational Stress Questionnaire    Feeling of Stress : Only a little  Social Connections: Unknown (09/16/2021)   Received from [facility], [facility]   Social Network    Social Network: Not on file  Intimate Partner Violence: Not At Risk (06/03/2023)   Humiliation, Afraid, Rape, and Kick  questionnaire    Fear of Current or Ex-Partner: No    Emotionally Abused: No    Physically Abused: No    Sexually Abused: No    Past Surgical History:  Procedure Laterality Date   ABDOMINAL HYSTERECTOMY     APPLICATION OF CRANIAL NAVIGATION N/A 11/21/2020   Procedure: APPLICATION OF CRANIAL NAVIGATION;  Surgeon: Cannon Champion, MD;  Location: [facility] OR;  Service: Neurosurgery;  Laterality: N/A;   BREAST BIOPSY Right 2014   BREAST BIOPSY Left 10/29/2019   CHOLECYSTECTOMY  2014   COLONOSCOPY  05/04/2009   prolapsing external hemorrhoids   CRANIOTOMY Left 11/21/2020   Procedure: Left craniotomy for tumor resection;  Surgeon: Cannon Champion, MD;  Location: [facility];  Service: Neurosurgery;  Laterality: Left;   ENDOMETRIAL ABLATION     HEMORRHOID SURGERY     x 3   TUBAL LIGATION      Family History  Problem Relation Age of Onset   Diabetes type II Mother    Kidney failure Sister    Diabetes Mellitus II Sister    Cancer Maternal Aunt        Stomach cancer   Colon cancer Maternal Aunt        aunts and uncles   Diabetes Mellitus II Maternal Aunt    Pancreatic cancer Maternal Aunt    Colon cancer Maternal Uncle    Diabetes Mellitus II Maternal Uncle    Heart disease Maternal Grandmother        aunts, uncles, sister   Diabetes Mellitus II Maternal Grandmother    Lung cancer Other        aunts and uncles    No Known Allergies  Current Outpatient Medications on File Prior to Visit  Medication Sig Dispense Refill   amLODipine  (NORVASC ) 10 MG tablet TAKE 1 TABLET BY MOUTH EVERY DAY 90 tablet 1   aspirin  EC 325 MG tablet Take 1 tablet (325 mg total) by mouth daily. 30 tablet 2   HYDROcodone  bit-homatropine (HYCODAN) 5-1.5 MG/5ML syrup Take 5 mLs by mouth every 8 (eight) hours as needed for cough. 120 mL 0   hydrocortisone  (CORTEF ) 10 MG tablet Take 1 tablet (10 mg total) by mouth daily. 30 tablet 0   Lacosamide  100 MG TABS Take 2 tablets (200 mg total) by mouth 2 (two) times  daily. 120 tablet 2   levETIRAcetam  (KEPPRA ) 750 MG tablet TAKE 2 TABLETS (1,500 MG TOTAL) BY MOUTH 2 (TWO) TIMES DAILY. 360 tablet 1   levothyroxine  (SYNTHROID ) 100 MCG tablet TAKE 1 TABLET BY MOUTH EVERY DAY 90 tablet 3   LORazepam  (ATIVAN ) 2 MG tablet TAKE 1 TABLET (2 MG TOTAL) BY MOUTH EVERY 8 (EIGHT) HOURS  AS NEEDED FOR SEIZURE. 12 tablet 0   losartan  (COZAAR ) 50 MG tablet Take 2 tablets (100 mg total) by mouth daily.     ondansetron  (ZOFRAN -ODT) 4 MG disintegrating tablet Take 1 tablet (4 mg total) by mouth every 8 (eight) hours as needed for nausea or vomiting. 10 tablet 0   Potassium Chloride  ER 20 MEQ TBCR Take 1 tablet (20 mEq total) by mouth daily. 90 tablet 3   prochlorperazine  (COMPAZINE ) 10 MG tablet Take 10 mg by mouth every 6 (six) hours as needed for nausea or vomiting.     No current facility-administered medications on file prior to visit.    BP 110/80   Pulse 87   Temp 98.3 F (36.8 C) (Oral)   Ht 5\' 7"  (1.702 m)   Wt 177 lb (80.3 kg)   SpO2 97%   BMI 27.72 kg/m       Objective:   Physical Exam Vitals and nursing note reviewed.  Constitutional:      Appearance: Normal appearance.  Cardiovascular:     Rate and Rhythm: Normal rate and regular rhythm.     Pulses: Normal pulses.     Heart sounds: Normal heart sounds.  Musculoskeletal:        General: Normal range of motion.  Skin:    General: Skin is warm and dry.  Neurological:     General: No focal deficit present.     Mental Status: She is alert and oriented to person, place, and time.  Psychiatric:        Mood and Affect: Mood normal.        Behavior: Behavior normal.        Thought Content: Thought content normal.        Judgment: Judgment normal.       Assessment & Plan:  1. Other fatigue (Primary) - Unsure if it is because of her thyroid  level or other causes.  Family would like to check vitamin D  and B12 levels today. - VITAMIN D  25 Hydroxy (Vit-D Deficiency, Fractures); Future - Vitamin  B12; Future - Vitamin B12 - VITAMIN D  25 Hydroxy (Vit-D Deficiency, Fractures)  2. Acquired hypothyroidism - Advised to give patient her Synthroid  every morning on empty stomach and not to take any more medications or have any p.o. intake for at least 30 minutes after taking Synthroid .  Will have them follow-up next week for repeat TSH and T4. - TSH; Future - T4, Free; Future  Alto Atta, NP

## 2023-10-03 ENCOUNTER — Ambulatory Visit: Payer: Self-pay | Admitting: Adult Health

## 2023-10-04 DIAGNOSIS — R569 Unspecified convulsions: Secondary | ICD-10-CM | POA: Diagnosis not present

## 2023-10-04 DIAGNOSIS — G40909 Epilepsy, unspecified, not intractable, without status epilepticus: Secondary | ICD-10-CM | POA: Diagnosis not present

## 2023-10-04 DIAGNOSIS — E785 Hyperlipidemia, unspecified: Secondary | ICD-10-CM | POA: Diagnosis not present

## 2023-10-04 DIAGNOSIS — I10 Essential (primary) hypertension: Secondary | ICD-10-CM | POA: Diagnosis not present

## 2023-10-04 DIAGNOSIS — Z85841 Personal history of malignant neoplasm of brain: Secondary | ICD-10-CM | POA: Diagnosis not present

## 2023-10-04 DIAGNOSIS — R4182 Altered mental status, unspecified: Secondary | ICD-10-CM | POA: Diagnosis not present

## 2023-10-04 DIAGNOSIS — R404 Transient alteration of awareness: Secondary | ICD-10-CM | POA: Diagnosis not present

## 2023-10-04 DIAGNOSIS — R0902 Hypoxemia: Secondary | ICD-10-CM | POA: Diagnosis not present

## 2023-10-04 DIAGNOSIS — R410 Disorientation, unspecified: Secondary | ICD-10-CM | POA: Diagnosis not present

## 2023-10-04 DIAGNOSIS — Z8673 Personal history of transient ischemic attack (TIA), and cerebral infarction without residual deficits: Secondary | ICD-10-CM | POA: Diagnosis not present

## 2023-10-04 DIAGNOSIS — Z79899 Other long term (current) drug therapy: Secondary | ICD-10-CM | POA: Diagnosis not present

## 2023-10-04 DIAGNOSIS — G9389 Other specified disorders of brain: Secondary | ICD-10-CM | POA: Diagnosis not present

## 2023-10-04 NOTE — ED Triage Notes (Signed)
 Pt. BIB EMS with c/o seizure lasting approx. 60 seconds. Hx of seizures and brain CA.

## 2023-10-04 NOTE — ED Provider Notes (Addendum)
 [facility]  Emergency Department Provider Note     History   Chief Complaint Unresponsive episode altered mental status history of brain cancer   HPI  [name] is a 58 y.o. female patient was found in the bathroom apparently she had fallen and possibly had a seizure but not witnessed.  History of seizures on Vimpat  with a history of brain cancer also history of being on Keppra  as well.  Patient according to the EMS is compliant with her medications.  Patient was postictal for at least an hour.  Patient unable to give any detailed information herself 3.  11:19 PM patient's daughter is here now and I was able to get further information.  Patient apparently tried to give her granddaughter a bath even though she was instructed not to do so by her daughter.  The daughter when she returned back saw that her mother the patient was on the floor and had 3 seizure episodes.  Currently here in the ER she is not postictal.  She also has a history of dysarthria related to previous stroke and 2022.  Also has history of seizure disorder also has a history of brain tumor which was diagnosed around 2022.  Not receiving any chemotherapy or any radiation therapy.  Last seizure episode a small 1 according to the son was approximately 1 month ago and prior to that about 2 to 3 months ago.  Patient is compliant in taking her medications as given by her daughter.  Has not missed any doses of medication no changes to the medications recently.  Has not been sick recently with anything new or different.  Past Medical History:  Diagnosis Date  . Brain cancer    . Brain tumor    . HTN (hypertension)   . Hyperlipidemia   . Thyroid  disorder     Past Surgical History:  Procedure Laterality Date  . TUMOR REMOVAL     L sided brain tumor    Prior to Admission medications   Medication Dose, Route, Frequency  amlodipine  (NORVASC ) 10 MG tablet 1 tablet, Oral, Daily (standard)  atorvastatin   (LIPITOR) 20 MG tablet 20 mg, Oral, Daily (standard)  lacosamide  (VIMPAT ) 100 mg Tab 1 tablet, Oral, 2 times a day (standard)  levETIRAcetam  (KEPPRA ) 750 MG tablet TAKE 2 TABLETS (1,500 MG TOTAL) BY MOUTH 2 (TWO) TIMES DAILY.  levothyroxine  (SYNTHROID ) 100 MCG tablet 1 tablet, Oral, Daily (standard)  prochlorperazine  (COMPAZINE ) 10 MG tablet 10 mg, Oral  sumatriptan  (IMITREX ) 50 MG tablet 50 mg, Oral    Allergies Patient has no known allergies.   Social History   Tobacco Use  . Smoking status: Never    Passive exposure: Never  . Smokeless tobacco: Never  Substance Use Topics  . Alcohol use: Never  . Drug use: Never    Review of Systems  Neurological:  Positive for seizures and loss of consciousness.  All other systems reviewed and are negative.   As in HPI, all systems reviewed and otherwise negative.  Physical Exam    There were no vitals filed for this visit.   Physical Exam  Constitutional: Patient appears well-developed and well nourished. Non toxic in appearance. HEENT: Unremarkable. Head: Atraumatic.  Eyes: Normal ocular movements. Neck: Supple with normal range of motion.  Pulmonary/Chest: Effort normal. No respiratory distress. Abdominal: Soft and non tender abdomen. Musculoskeletal: Extremities atraumatic. Neurological: Alert with underlying chronic focal deficits with weakness of the right left upper and lower extremity and facial drooping from the  previous stroke in the past years ago.  No new focal deficits noted.  This is confirmed by examining and evaluating the patient in front of the daughter.  Patient also has trouble with speech and dysarthria from the previous stroke as well.  Also has a facial drooping from the previous stroke. Skin: Warm and dry.  Nursing note and vital signs reviewed.   ED Course        Procedures  Medications ordered during this encounter  Medications  . sodium chloride  0.9% (NS) bolus 1,000 mL    ED Results No  results found for any visits on 10/04/23.  Radiology No results found.   Medical Decision Making   I have reviewed the vital signs and the nursing notes. Labs and radiology results that were available during my care of the patient were independently reviewed by me and considered in my medical decision making.    This is a 58 year old female known history of seizure disorder was found unresponsive in the bathroom with no obvious injuries by EMS.  No obvious any injuries on examination.  Medical Decision Making    Differential Diagnosis: Seizure disorder, loss of consciousness, vasovagal episode  Sent a chat message to Dr. Gigi who suggest after hearing about the patient through the chart message that he wants the patient to be transferred to [facility].    ED Clinical Impression   Final diagnoses:  None  Multiple seizure episodes Seizure disorder  Procedures      This record has been created using [employer]. Chart creation errors have been sought, but may not always have been located. Such creation errors do not reflect on the standard of medical care.   Maree Jonelle Lash, MD 10/05/23 [name]    Maree Jonelle Lash, MD 10/05/23 [phone]

## 2023-10-05 ENCOUNTER — Encounter (HOSPITAL_COMMUNITY): Payer: Self-pay

## 2023-10-05 ENCOUNTER — Inpatient Hospital Stay (HOSPITAL_COMMUNITY)

## 2023-10-05 ENCOUNTER — Inpatient Hospital Stay (HOSPITAL_COMMUNITY)
Admission: EM | Admit: 2023-10-05 | Discharge: 2023-10-09 | DRG: 100 | Disposition: A | Discharge status: Home Health | Source: Other Acute Inpatient Hospital | Attending: Internal Medicine | Admitting: Internal Medicine

## 2023-10-05 DIAGNOSIS — R000 Tachycardia, unspecified: Secondary | ICD-10-CM | POA: Diagnosis not present

## 2023-10-05 DIAGNOSIS — R0689 Other abnormalities of breathing: Secondary | ICD-10-CM | POA: Diagnosis not present

## 2023-10-05 DIAGNOSIS — I69351 Hemiplegia and hemiparesis following cerebral infarction affecting right dominant side: Secondary | ICD-10-CM | POA: Diagnosis not present

## 2023-10-05 DIAGNOSIS — Z841 Family history of disorders of kidney and ureter: Secondary | ICD-10-CM

## 2023-10-05 DIAGNOSIS — Z923 Personal history of irradiation: Secondary | ICD-10-CM

## 2023-10-05 DIAGNOSIS — Z8249 Family history of ischemic heart disease and other diseases of the circulatory system: Secondary | ICD-10-CM

## 2023-10-05 DIAGNOSIS — Z8673 Personal history of transient ischemic attack (TIA), and cerebral infarction without residual deficits: Secondary | ICD-10-CM | POA: Diagnosis not present

## 2023-10-05 DIAGNOSIS — J189 Pneumonia, unspecified organism: Secondary | ICD-10-CM | POA: Diagnosis present

## 2023-10-05 DIAGNOSIS — Z7982 Long term (current) use of aspirin: Secondary | ICD-10-CM | POA: Diagnosis not present

## 2023-10-05 DIAGNOSIS — Z79890 Hormone replacement therapy (postmenopausal): Secondary | ICD-10-CM | POA: Diagnosis not present

## 2023-10-05 DIAGNOSIS — E785 Hyperlipidemia, unspecified: Secondary | ICD-10-CM | POA: Diagnosis present

## 2023-10-05 DIAGNOSIS — C250 Malignant neoplasm of head of pancreas: Secondary | ICD-10-CM | POA: Diagnosis present

## 2023-10-05 DIAGNOSIS — Z85841 Personal history of malignant neoplasm of brain: Secondary | ICD-10-CM

## 2023-10-05 DIAGNOSIS — Z801 Family history of malignant neoplasm of trachea, bronchus and lung: Secondary | ICD-10-CM

## 2023-10-05 DIAGNOSIS — E039 Hypothyroidism, unspecified: Secondary | ICD-10-CM | POA: Diagnosis present

## 2023-10-05 DIAGNOSIS — R569 Unspecified convulsions: Secondary | ICD-10-CM | POA: Diagnosis not present

## 2023-10-05 DIAGNOSIS — I69320 Aphasia following cerebral infarction: Secondary | ICD-10-CM | POA: Diagnosis not present

## 2023-10-05 DIAGNOSIS — G9389 Other specified disorders of brain: Secondary | ICD-10-CM | POA: Diagnosis not present

## 2023-10-05 DIAGNOSIS — E559 Vitamin D deficiency, unspecified: Secondary | ICD-10-CM | POA: Diagnosis not present

## 2023-10-05 DIAGNOSIS — Z800 Family history of malignant neoplasm of digestive organs: Secondary | ICD-10-CM | POA: Diagnosis not present

## 2023-10-05 DIAGNOSIS — R918 Other nonspecific abnormal finding of lung field: Secondary | ICD-10-CM | POA: Diagnosis not present

## 2023-10-05 DIAGNOSIS — G40409 Other generalized epilepsy and epileptic syndromes, not intractable, without status epilepticus: Principal | ICD-10-CM | POA: Diagnosis present

## 2023-10-05 DIAGNOSIS — M79661 Pain in right lower leg: Secondary | ICD-10-CM | POA: Diagnosis not present

## 2023-10-05 DIAGNOSIS — N179 Acute kidney failure, unspecified: Secondary | ICD-10-CM | POA: Diagnosis not present

## 2023-10-05 DIAGNOSIS — E876 Hypokalemia: Secondary | ICD-10-CM | POA: Diagnosis present

## 2023-10-05 DIAGNOSIS — G40909 Epilepsy, unspecified, not intractable, without status epilepticus: Secondary | ICD-10-CM

## 2023-10-05 DIAGNOSIS — C719 Malignant neoplasm of brain, unspecified: Secondary | ICD-10-CM | POA: Diagnosis not present

## 2023-10-05 DIAGNOSIS — I10 Essential (primary) hypertension: Secondary | ICD-10-CM | POA: Diagnosis not present

## 2023-10-05 DIAGNOSIS — R404 Transient alteration of awareness: Secondary | ICD-10-CM | POA: Diagnosis not present

## 2023-10-05 DIAGNOSIS — Z9049 Acquired absence of other specified parts of digestive tract: Secondary | ICD-10-CM

## 2023-10-05 DIAGNOSIS — Z9221 Personal history of antineoplastic chemotherapy: Secondary | ICD-10-CM | POA: Diagnosis not present

## 2023-10-05 DIAGNOSIS — M79604 Pain in right leg: Secondary | ICD-10-CM | POA: Diagnosis present

## 2023-10-05 DIAGNOSIS — Z833 Family history of diabetes mellitus: Secondary | ICD-10-CM | POA: Diagnosis not present

## 2023-10-05 DIAGNOSIS — R7989 Other specified abnormal findings of blood chemistry: Secondary | ICD-10-CM | POA: Diagnosis present

## 2023-10-05 DIAGNOSIS — G40309 Generalized idiopathic epilepsy and epileptic syndromes, not intractable, without status epilepticus: Secondary | ICD-10-CM | POA: Diagnosis not present

## 2023-10-05 DIAGNOSIS — E0590 Thyrotoxicosis, unspecified without thyrotoxic crisis or storm: Secondary | ICD-10-CM | POA: Diagnosis not present

## 2023-10-05 DIAGNOSIS — G4733 Obstructive sleep apnea (adult) (pediatric): Secondary | ICD-10-CM | POA: Diagnosis not present

## 2023-10-05 DIAGNOSIS — Z743 Need for continuous supervision: Secondary | ICD-10-CM | POA: Diagnosis not present

## 2023-10-05 DIAGNOSIS — Z79899 Other long term (current) drug therapy: Secondary | ICD-10-CM | POA: Diagnosis not present

## 2023-10-05 DIAGNOSIS — Z90710 Acquired absence of both cervix and uterus: Secondary | ICD-10-CM | POA: Diagnosis not present

## 2023-10-05 DIAGNOSIS — R0600 Dyspnea, unspecified: Secondary | ICD-10-CM | POA: Diagnosis not present

## 2023-10-05 DIAGNOSIS — J9811 Atelectasis: Secondary | ICD-10-CM | POA: Diagnosis not present

## 2023-10-05 DIAGNOSIS — F445 Conversion disorder with seizures or convulsions: Secondary | ICD-10-CM | POA: Diagnosis not present

## 2023-10-05 LAB — CBC WITH DIFFERENTIAL/PLATELET
Abs Immature Granulocytes: 0 10*3/uL (ref 0.00–0.07)
Basophils Absolute: 0 10*3/uL (ref 0.0–0.1)
Basophils Relative: 0 %
Eosinophils Absolute: 0 10*3/uL (ref 0.0–0.5)
Eosinophils Relative: 0 %
HCT: 38.6 % (ref 36.0–46.0)
Hemoglobin: 12.7 g/dL (ref 12.0–15.0)
Lymphocytes Relative: 20 %
Lymphs Abs: 1.3 10*3/uL (ref 0.7–4.0)
MCH: 31.2 pg (ref 26.0–34.0)
MCHC: 32.9 g/dL (ref 30.0–36.0)
MCV: 94.8 fL (ref 80.0–100.0)
Monocytes Absolute: 0.3 10*3/uL (ref 0.1–1.0)
Monocytes Relative: 4 %
Neutro Abs: 4.9 10*3/uL (ref 1.7–7.7)
Neutrophils Relative %: 76 %
Platelets: 152 10*3/uL (ref 150–400)
RBC: 4.07 MIL/uL (ref 3.87–5.11)
RDW: 13.9 % (ref 11.5–15.5)
WBC: 6.5 10*3/uL (ref 4.0–10.5)
nRBC: 0 % (ref 0.0–0.2)
nRBC: 0 /100{WBCs}

## 2023-10-05 LAB — TROPONIN I (HIGH SENSITIVITY): Troponin I (High Sensitivity): 3 ng/L (ref <18)

## 2023-10-05 LAB — T4, FREE: Free T4: 1.19 ng/dL — ABNORMAL HIGH (ref 0.61–1.12)

## 2023-10-05 LAB — MAGNESIUM: Magnesium: 2 mg/dL (ref 1.7–2.4)

## 2023-10-05 LAB — PHOSPHORUS: Phosphorus: 3.1 mg/dL (ref 2.5–4.6)

## 2023-10-05 LAB — D-DIMER, QUANTITATIVE: D-Dimer, Quant: 0.9 ug{FEU}/mL — ABNORMAL HIGH (ref 0.00–0.50)

## 2023-10-05 LAB — CK: Total CK: 118 U/L (ref 38–234)

## 2023-10-05 LAB — HIV ANTIBODY (ROUTINE TESTING W REFLEX): HIV Screen 4th Generation wRfx: NONREACTIVE

## 2023-10-05 LAB — TSH: TSH: 0.43 u[IU]/mL (ref 0.350–4.500)

## 2023-10-05 MED ORDER — HEPARIN SODIUM (PORCINE) 5000 UNIT/ML IJ SOLN
5000.0000 [IU] | Freq: Three times a day (TID) | INTRAMUSCULAR | Status: DC
  Administered 2023-10-05 – 2023-10-09 (×11): 5000 [IU] via SUBCUTANEOUS
  Filled 2023-10-05 (×11): qty 1

## 2023-10-05 MED ORDER — DOCUSATE SODIUM 100 MG PO CAPS
100.0000 mg | ORAL_CAPSULE | Freq: Two times a day (BID) | ORAL | Status: DC
  Administered 2023-10-05 – 2023-10-09 (×8): 100 mg via ORAL
  Filled 2023-10-05 (×8): qty 1

## 2023-10-05 MED ORDER — ACETAMINOPHEN 650 MG RE SUPP: 650.0000 mg | RECTAL | Status: DC | PRN

## 2023-10-05 MED ORDER — LEVETIRACETAM 750 MG PO TABS: 1500.0000 mg | ORAL_TABLET | Freq: Two times a day (BID) | ORAL | Status: DC

## 2023-10-05 MED ORDER — ACETAMINOPHEN 325 MG PO TABS
650.0000 mg | ORAL_TABLET | ORAL | Status: DC | PRN
  Administered 2023-10-06: 650 mg via ORAL
  Filled 2023-10-05: qty 2

## 2023-10-05 MED ORDER — HYDROCORTISONE 10 MG PO TABS
10.0000 mg | ORAL_TABLET | Freq: Every day | ORAL | Status: DC
  Administered 2023-10-06 – 2023-10-09 (×4): 10 mg via ORAL
  Filled 2023-10-05 (×4): qty 1

## 2023-10-05 MED ORDER — ORAL CARE MOUTH RINSE: 15.0000 mL | OROMUCOSAL | Status: DC | PRN

## 2023-10-05 MED ORDER — LORAZEPAM 2 MG/ML IJ SOLN: 4.0000 mg | INTRAMUSCULAR | Status: DC | PRN

## 2023-10-05 MED ORDER — POLYETHYLENE GLYCOL 3350 17 G PO PACK
17.0000 g | PACK | Freq: Every day | ORAL | Status: DC | PRN
  Administered 2023-10-06: 17 g via ORAL
  Filled 2023-10-05: qty 1

## 2023-10-05 MED ORDER — LEVETIRACETAM 750 MG PO TABS
1750.0000 mg | ORAL_TABLET | Freq: Two times a day (BID) | ORAL | Status: DC
  Administered 2023-10-05 – 2023-10-09 (×8): 1750 mg via ORAL
  Filled 2023-10-05 (×8): qty 1

## 2023-10-05 MED ORDER — AMLODIPINE BESYLATE 5 MG PO TABS
10.0000 mg | ORAL_TABLET | Freq: Every day | ORAL | Status: DC
  Administered 2023-10-06: 10 mg via ORAL
  Filled 2023-10-05: qty 2

## 2023-10-05 MED ORDER — LACOSAMIDE 200 MG PO TABS
200.0000 mg | ORAL_TABLET | Freq: Two times a day (BID) | ORAL | Status: DC
  Administered 2023-10-05 – 2023-10-09 (×8): 200 mg via ORAL
  Filled 2023-10-05 (×8): qty 1

## 2023-10-05 NOTE — Progress Notes (Signed)
 EEG complete - results pending

## 2023-10-05 NOTE — Consult Note (Signed)
 NEUROLOGY CONSULT NOTE   Date of service: October 05, 2023 Patient Name: [name] MRN:  [id number] DOB:  Jan 01, 1966 Chief Complaint: "seizure like activity " Requesting Provider: Lavanda Porter, MD  History of Present Illness  [name] is a 58 y.o. female with hx of seizures, Bells palsy, brain cancer, OSA, HTN, HLD, hyperthyroidism, migraine headache, who presented to OSH for about 5-6 seizures at home yesterday. Family at the bedside states she has been compliant with her mediations and has not missed a dose. They state that her each seizure lasted <5 minutes and resolved on its own, patient was postictal, never returned to baseline after each seizure. Family states it has been months since her last seizure. Family states she was giving a child a bath and they feel she got too hot. Denies any sickness, fevers, cough, n/v  She transferred to [facility] for further management.   CT head at [facility] negative for acute process  Neurology consulted     ROS   Comprehensive ROS Unable to ascertain due to aphasia  Past History   Past Medical History:  Diagnosis Date   Bell's palsy    left side of face- notied in smile and eyes, if very tired   Brain cancer ([name])    Complication of anesthesia    N/V   Essential hypertension    External hemorrhoids    History of blood transfusion    post op Hemorroids   History of pneumonia    Hyperlipidemia    Hyperthyroidism    hx 58 years old- oral meds   Iron deficiency anemia    Migraine headache    Pneumonia    x 2 - years ago   PONV (postoperative nausea and vomiting)    PPD positive    Pulmonary nodules    not seen on plain cxr, first detected 1/07 re ct 10/08 pos IPPD > 15 mm 12/09 FOB/lavage 04/20/08 neg afb smear   PVC's (premature ventricular contractions)    Seizure ([name]) 11/15/2020   Sleep apnea    refused CPAP- mouth piece recommended   Tuberculosis    in her late 30's    Past Surgical History:  Procedure Laterality  Date   ABDOMINAL HYSTERECTOMY     APPLICATION OF CRANIAL NAVIGATION N/A 11/21/2020   Procedure: APPLICATION OF CRANIAL NAVIGATION;  Surgeon: Cannon Champion, MD;  Location: [facility] OR;  Service: Neurosurgery;  Laterality: N/A;   BREAST BIOPSY Right 2014   BREAST BIOPSY Left 10/29/2019   CHOLECYSTECTOMY  2014   COLONOSCOPY  05/04/2009   prolapsing external hemorrhoids   CRANIOTOMY Left 11/21/2020   Procedure: Left craniotomy for tumor resection;  Surgeon: Cannon Champion, MD;  Location: [facility] OR;  Service: Neurosurgery;  Laterality: Left;   ENDOMETRIAL ABLATION     HEMORRHOID SURGERY     x 3   TUBAL LIGATION      Family History: Family History  Problem Relation Age of Onset   Diabetes type II Mother    Kidney failure Sister    Diabetes Mellitus II Sister    Cancer Maternal Aunt        Stomach cancer   Colon cancer Maternal Aunt        aunts and uncles   Diabetes Mellitus II Maternal Aunt    Pancreatic cancer Maternal Aunt    Colon cancer Maternal Uncle    Diabetes Mellitus II Maternal Uncle    Heart disease Maternal Grandmother  aunts, uncles, sister   Diabetes Mellitus II Maternal Grandmother    Lung cancer Other        aunts and uncles    Social History  reports that she has never smoked. She has never used smokeless tobacco. She reports that she does not drink alcohol and does not use drugs.  No Known Allergies  Medications   Current Facility-Administered Medications:    acetaminophen  (TYLENOL ) tablet 650 mg, 650 mg, Oral, Q4H PRN **OR** acetaminophen  (TYLENOL ) suppository 650 mg, 650 mg, Rectal, Q4H PRN, Lavanda Porter, MD   amLODipine  (NORVASC ) tablet 10 mg, 10 mg, Oral, Daily, Patel, Alcide Humble, MD   docusate sodium  (COLACE) capsule 100 mg, 100 mg, Oral, BID, Patel, Ekta V, MD   heparin  injection 5,000 Units, 5,000 Units, Subcutaneous, Q8H, Lydia Sams, Alcide Humble, MD   [START ON 10/06/2023] hydrocortisone  (CORTEF ) tablet 10 mg, 10 mg, Oral, Daily, Lydia Sams, Alcide Humble, MD    lacosamide  (VIMPAT ) tablet 200 mg, 200 mg, Oral, BID, Lydia Sams, Ekta V, MD   levETIRAcetam  (KEPPRA ) tablet 1,500 mg, 1,500 mg, Oral, BID, Lydia Sams, Ekta V, MD   LORazepam  (ATIVAN ) injection 4 mg, 4 mg, Intravenous, Q5 Min x 2 PRN, Lavanda Porter, MD   Oral care mouth rinse, 15 mL, Mouth Rinse, PRN, Lydia Sams, Alcide Humble, MD   polyethylene glycol (MIRALAX  / GLYCOLAX ) packet 17 g, 17 g, Oral, Daily PRN, Lavanda Porter, MD  Vitals   Vitals:   10/05/23 1533  BP: (!) (P) 143/85  Pulse: (!) (P) 109  Resp: (P) 18  Temp: (P) 98.4 F (36.9 C)  TempSrc: (P) Oral  SpO2: (P) 100%    There is no height or weight on file to calculate BMI.  Physical Exam   Constitutional: Appears well-developed and well-nourished.   Psych: Affect appropriate to situation.   Eyes: No scleral injection.   HENT: No OP obstruction.   Head: Normocephalic.   Cardiovascular: Normal rate and regular rhythm.   Respiratory: Effort normal, non-labored breathing.   GI: Soft.  No distension. There is no tenderness.   Skin: WDI.    Neurologic Examination   Mental Status -  Patient is awake and alert. Able to state her name. Age she states "52". Unable to state place or month. She expressive >receptive aphasia, can follow simple commands, unable to name or repeat.   Cranial Nerves II - XII - II - Visual field intact OU . III, IV, VI - Extraocular movements intact . V - decreased on right  . VII - right facial droop VIII - Hearing & vestibular intact bilaterally . X - Palate elevates symmetrically . XI - Chin turning & shoulder shrug intact bilaterally . XII - Tongue protrusion intact .  Motor Strength - right arm 4/5 with drift and right leg 3-4/5, left arm 5/5, left leg 5/5.   Motor Tone - Muscle tone was assessed at the neck and appendages and was normal . Sensory - decreased on right arm and right leg    Coordination - The patient had normal movements in the hands and feet with no ataxia or dysmetria.  Tremor was absent  . Gait and Station - deferred.  Labs/Imaging/Neurodiagnostic studies   CBC: No results for input(s): "WBC", "NEUTROABS", "HGB", "HCT", "MCV", "PLT" in the last 168 hours. Basic Metabolic Panel:  Lab Results  Component Value Date   NA 140 08/19/2023   K 3.8 08/19/2023   CO2 26 08/19/2023   GLUCOSE 104 (H) 08/19/2023   BUN 16  08/19/2023   CREATININE 1.04 (H) 08/19/2023   CALCIUM  9.9 08/19/2023   GFRNONAA >60 08/19/2023   GFRAA 83 01/25/2020   Lipid Panel:  Lab Results  Component Value Date   LDLCALC 119 (H) 01/25/2020   HgbA1c:  Lab Results  Component Value Date   HGBA1C 5.7 (H) 01/25/2020   Urine Drug Screen: No results found for: "LABOPIA", "COCAINSCRNUR", "LABBENZ", "AMPHETMU", "THCU", "LABBARB"  Alcohol Level No results found for: "ETH" INR  Lab Results  Component Value Date   INR 1.06 06/17/2009   APTT No results found for: "APTT" AED levels: No results found for: "PHENYTOIN", "ZONISAMIDE", "LAMOTRIGINE", "LEVETIRACETA"  CT Head without contrast @ OSH  No CT evidence of an acute intracranial process or obvious mass   Neurodiagnostics rEEG:  ordered  ASSESSMENT   [name] is a 58 y.o. female  x of seizures, Bells palsy, brain cancer, OSA, HTN, HLD, hyperthyroidism, migraine headache, who presented to OSH for about 5-6 seizures at home yesterday.   RECOMMENDATIONS  - Seizure precautions  - Check routine EEG  - continue home vimpat  200 twice daily  - Keppra  1750 twice daily - Neurology will continue  ______________________________________________________________________  Signed, Laymond Priestly, NP [facility]  I have seen the patient and reviewed the above note.  She presented yesterday with seizure flurry, no clear precipitant.  She is already on fairly good doses of Vimpat  and Keppra .  I am hesitant to add a third agent given how long she had fairly good control, but she may get some benefit from a very small increase in her Keppra .   I will therefore increase it to 1750 twice daily to see if that provides her with benefit.  If no further seizures by tomorrow, then she could be discharged with outpatient follow-up.  Ann Keto, MD [facility]   If 7pm- 7am, please page neurology on call as listed in AMION.

## 2023-10-05 NOTE — Progress Notes (Signed)
 Patient arrived to [phone]. No orders noted. Attending MD notified. VSS. TELE applied and confirmed. Awaiting for orders from physician.

## 2023-10-05 NOTE — Procedures (Signed)
 Patient Name: [name]  MRN: [id number]  Epilepsy Attending: Arleene Lack  Referring Physician/Provider: Laymond Priestly, NP  Date: 10/05/2023 Duration: 22.25 mins  Patient history: 58yo F with seizure history on vimpat  and keppra  at home, presented to OSH for 5-6 seizures. EEG to evaluate for seizure  Level of alertness: Awake, asleep  AEDs during EEG study: LEV, LCM  Technical aspects: This EEG study was done with scalp electrodes positioned according to the 10-20 International system of electrode placement. Electrical activity was reviewed with band pass filter of 1-70Hz , sensitivity of 7 uV/mm, display speed of 6mm/sec with a 60Hz  notched filter applied as appropriate. EEG data were recorded continuously and digitally stored.  Video monitoring was available and reviewed as appropriate.  Description: The posterior dominant rhythm consists of 8-9Hz  activity of moderate voltage (25-35 uV) seen predominantly in posterior head regions, asymmetric( left<right) and reactive to eye opening and eye closing. Sleep was characterized by vertex waves, sleep spindles (12 to 14 Hz), maximal frontocentral region. EEG showed continuous polymorphic rhythmic 3 to 6 Hz theta-delta slowing in left hemisphere. Additionally there was sharply contoured 3-6hz  theta-delta slowing left temporal region consistent with breach artifact. Sharp waves were noted in left temporal region, qasi-periodic at 1hz .  Hyperventilation and photic stimulation were not performed.     ABNORMALITY - Sharp wave, left temporal region - Breach artifact, left temporal region - Continuous slow, left hemisphere  - Background asymmetry, left<right  IMPRESSION: This study showed evidence of epileptogenicity arising from left temporal region with increased risk of seizure recurrence. Additionally there was cortical dysfunction arising from left temporal region consistent with underlying craniotomy. No seizures were seen throughout  the recording.  If suspicion for ictal activity remains a concern, a prolonged study can be considered.   [name]

## 2023-10-05 NOTE — H&P (Signed)
 History and Physical    Patient: [name] ZOX:[id number] DOB: 1965/10/01 DOA: 10/05/2023 DOS: the patient was seen and examined on 10/05/2023 PCP: Alto Atta, NP   Transferring facility: [facility] ED Requesting provider: Dr. Marnette Sinner (EDP at [facility]) Reason for transfer: admission for further evaluation and management of breakthrough seizures.   Chief complaint: Seizures.  HPI:  [name] is a 57 y.o. female with past medical history  of  Bell's palsy, Brain cancer ([name]), Complication of anesthesia, Essential hypertension, External hemorrhoids, History of blood transfusion, History of pneumonia, Hyperlipidemia, Hyperthyroidism, Iron deficiency anemia, Migraine headache, Pneumonia, PONV (postoperative nausea and vomiting), PPD positive, Pulmonary nodules, PVC's (premature ventricular contractions), Seizure ([name]) (11/15/2020), Sleep apnea, and Tuberculosis coming for recurrent seizures.    Per PMMD: "Over the last day, the patient has exhibited evidence of 3 separate episodes of tonic-clonic activity as witnessed by her daughter, who reports that each such episode lasted for less than 5 minutes and ended via spontaneous termination without need for pharmacologic intervention, followed by a period of postictal diminished responsiveness, with each such post-ictal state lasting for less than 30 minutes and resolving back to baseline mental status prior to onset of next episode of tonic clonic activity. After 3rd such episode of tonic-clonic activity, the patient was brought to [facility] emergency department for further evaluation/management.  Upon arrival in the emergency department, she was noted to exhibit initial diminished responsiveness, which subsequently resolved, with EDP conveying that the patient is now back to her baseline mental status/responsiveness, as confirmed by the patient's daughter, who accompanied her to the ED.  No additional episodes of  tonic-clonic activity have been observed throughout her course in [facility] ED."  ED Course: At [facility] care everywhere head CT negative for any acute intracranial abnormalities. Lab work shows AKI with a creatinine of 1.7 otherwise normal urine toxicology normal CBC with differential, normal LFTs.   Review of Systems  Respiratory:  Positive for shortness of breath.   Musculoskeletal:        Right leg pain  Neurological:  Positive for seizures and weakness.   Past Medical History:  Diagnosis Date   Bell's palsy    left side of face- notied in smile and eyes, if very tired   Brain cancer ([name])    Complication of anesthesia    N/V   Essential hypertension    External hemorrhoids    History of blood transfusion    post op Hemorroids   History of pneumonia    Hyperlipidemia    Hyperthyroidism    hx 58 years old- oral meds   Iron deficiency anemia    Migraine headache    Pneumonia    x 2 - years ago   PONV (postoperative nausea and vomiting)    PPD positive    Pulmonary nodules    not seen on plain cxr, first detected 1/07 re ct 10/08 pos IPPD > 15 mm 12/09 FOB/lavage 04/20/08 neg afb smear   PVC's (premature ventricular contractions)    Seizure ([name]) 11/15/2020   Sleep apnea    refused CPAP- mouth piece recommended   Tuberculosis    in her late 30's   Past Surgical History:  Procedure Laterality Date   ABDOMINAL HYSTERECTOMY     APPLICATION OF CRANIAL NAVIGATION N/A 11/21/2020   Procedure: APPLICATION OF CRANIAL NAVIGATION;  Surgeon: Cannon Champion, MD;  Location: [facility] OR;  Service: Neurosurgery;  Laterality: N/A;   BREAST BIOPSY Right 2014  BREAST BIOPSY Left 10/29/2019   CHOLECYSTECTOMY  2014   COLONOSCOPY  05/04/2009   prolapsing external hemorrhoids   CRANIOTOMY Left 11/21/2020   Procedure: Left craniotomy for tumor resection;  Surgeon: Cannon Champion, MD;  Location: [facility] OR;  Service: Neurosurgery;  Laterality: Left;   ENDOMETRIAL ABLATION      HEMORRHOID SURGERY     x 3   TUBAL LIGATION      reports that she has never smoked. She has never used smokeless tobacco. She reports that she does not drink alcohol and does not use drugs. No Known Allergies Family History  Problem Relation Age of Onset   Diabetes type II Mother    Kidney failure Sister    Diabetes Mellitus II Sister    Cancer Maternal Aunt        Stomach cancer   Colon cancer Maternal Aunt        aunts and uncles   Diabetes Mellitus II Maternal Aunt    Pancreatic cancer Maternal Aunt    Colon cancer Maternal Uncle    Diabetes Mellitus II Maternal Uncle    Heart disease Maternal Grandmother        aunts, uncles, sister   Diabetes Mellitus II Maternal Grandmother    Lung cancer Other        aunts and uncles   Prior to Admission medications   Medication Sig Start Date End Date Taking? Authorizing Provider  amLODipine  (NORVASC ) 10 MG tablet TAKE 1 TABLET BY MOUTH EVERY DAY 05/19/23   Vaslow, Zachary K, MD  aspirin  EC 325 MG tablet Take 1 tablet (325 mg total) by mouth daily. 07/18/23   Vaslow, Zachary K, MD  HYDROcodone  bit-homatropine (HYCODAN) 5-1.5 MG/5ML syrup Take 5 mLs by mouth every 8 (eight) hours as needed for cough. 08/06/22   Nafziger, Randel Buss, NP  hydrocortisone  (CORTEF ) 10 MG tablet Take 1 tablet (10 mg total) by mouth daily. 09/12/23   Vaslow, Zachary K, MD  Lacosamide  100 MG TABS Take 2 tablets (200 mg total) by mouth 2 (two) times daily. 06/16/23   Vaslow, Zachary K, MD  levETIRAcetam  (KEPPRA ) 750 MG tablet TAKE 2 TABLETS (1,500 MG TOTAL) BY MOUTH 2 (TWO) TIMES DAILY. 05/19/23   Vaslow, Zachary K, MD  levothyroxine  (SYNTHROID ) 100 MCG tablet TAKE 1 TABLET BY MOUTH EVERY DAY 06/27/23   Nafziger, Randel Buss, NP  LORazepam  (ATIVAN ) 2 MG tablet TAKE 1 TABLET (2 MG TOTAL) BY MOUTH EVERY 8 (EIGHT) HOURS AS NEEDED FOR SEIZURE. 06/16/23   Vaslow, Zachary K, MD  losartan  (COZAAR ) 50 MG tablet Take 2 tablets (100 mg total) by mouth daily. 07/24/23   Vaslow, Zachary K, MD   ondansetron  (ZOFRAN -ODT) 4 MG disintegrating tablet Take 1 tablet (4 mg total) by mouth every 8 (eight) hours as needed for nausea or vomiting. 10/31/22   Vaslow, Zachary K, MD  Potassium Chloride  ER 20 MEQ TBCR Take 1 tablet (20 mEq total) by mouth daily. 01/30/23   Vaslow, Zachary K, MD  prochlorperazine  (COMPAZINE ) 10 MG tablet Take 10 mg by mouth every 6 (six) hours as needed for nausea or vomiting.    [provider]  Vitals:   10/05/23 1533  BP: (!) (P) 143/85  Pulse: (!) (P) 109  Resp: (P) 18  Temp: (P) 98.4 F (36.9 C)  TempSrc: (P) Oral  SpO2: (P) 100%   Physical Exam Constitutional:      General: She is not in acute distress.    Appearance: She is not ill-appearing.  HENT:     Head: Normocephalic and atraumatic.     Right Ear: External ear normal.     Left Ear: External ear normal.  Eyes:     Extraocular Movements: Extraocular movements intact.     Pupils: Pupils are equal, round, and reactive to light.  Cardiovascular:     Rate and Rhythm: Regular rhythm. Tachycardia present.     Pulses: Normal pulses.     Heart sounds: Normal heart sounds.  Pulmonary:     Effort: Pulmonary effort is normal.     Breath sounds: Normal breath sounds.  Abdominal:     General: Bowel sounds are normal.     Palpations: Abdomen is soft.  Musculoskeletal:     Right lower leg: No edema.     Left lower leg: No edema.  Skin:    General: Skin is warm.  Neurological:     Mental Status: She is alert and oriented to person, place, and time.     Cranial Nerves: Facial asymmetry present.     Motor: Weakness present.     Comments: Baseline right-sided facial droop, right upper extremity weakness Patient holds her right upper extremity in a flexed position.   Psychiatric:        Attention and Perception: Attention normal.        Mood and Affect: Mood normal.        Speech: Speech normal.        Behavior:  Behavior is cooperative.     No results found. However, due to the size of the patient record, not all encounters were searched. Please check Results Review for a complete set of results.  Radiological Exams on Admission: No results found.  Data Reviewed: Relevant notes from primary care and specialist visits, past discharge summaries as available in EHR, including Care Everywhere . Prior diagnostic testing as pertinent to current admission diagnoses, Updated medications and problem lists for reconciliation .ED course, including vitals, labs, imaging, treatment and response to treatment,Triage notes, nursing and pharmacy notes and ED provider's notes.Notable results as noted in HPI.Discussed case with EDMD/ ED APP/ or Specialty MD on call and as needed.  Assessment & Plan  >>Seizures: Pt is already on AED and pt is complaint, Neurology consult. Will evaluate further for cause of seizures.  Seizure precaution aspiration and fall precaution continued on Vimpat  and Keppra .  >> Right leg pain: CK and dimer pending.  Additional evaluation based on lab testing.  >> Shortness of breath: Patient reports shortness of breath for the past few weeks along with dyspnea on exertion and fatigue.  We will check TNI and BNP and troponin.   >>H/O GBM: Pt sees oncology and s/p chemo and radiation.  >> History of CVA with baseline right-sided weakness: Patient has some aphasia and right upper extremity weakness and right-sided facial droop.   >>Essential HTN: Pt is on amlodipine  and losartan  at home.  Vitals:   10/05/23 1533  BP: (!) (P) 143/85  We will continue amlodipine  and hold losartan  due to AKI. Creatinine of 1.4 at OSH.  Once AKI resolves we will continue and resume patient's losartan .   >> Acquired  hypothyroidism: Will continue patient on her levothyroxine  at 100 mcg p.o. daily.    DVT prophylaxis:  Heparin .   Consults:  Neurology.   Advance Care Planning:    Code Status:  Full Code   Family Communication:  Daughter.   Disposition Plan:  Home.   Severity of Illness: The appropriate patient status for this patient is INPATIENT. Inpatient status is judged to be reasonable and necessary in order to provide the required intensity of service to ensure the patient's safety. The patient's presenting symptoms, physical exam findings, and initial radiographic and laboratory data in the context of their chronic comorbidities is felt to place them at high risk for further clinical deterioration. Furthermore, it is not anticipated that the patient will be medically stable for discharge from the hospital within 2 midnights of admission.   * I certify that at the point of admission it is my clinical judgment that the patient will require inpatient hospital care spanning beyond 2 midnights from the point of admission due to high intensity of service, high risk for further deterioration and high frequency of surveillance required.*  Unresulted Labs (From admission, onward)     Start     Ordered   10/06/23 0500  Comprehensive metabolic panel  Tomorrow morning,   R        10/05/23 1609   10/06/23 0500  CBC  Tomorrow morning,   R        10/05/23 1609   10/05/23 1640  D-dimer, quantitative  Add-on,   AD        10/05/23 1639   10/05/23 1640  T4, free  Add-on,   AD        10/05/23 1639   10/05/23 1640  TSH  Add-on,   AD        10/05/23 1639   10/05/23 1640  CK  Once,   R        10/05/23 1640   10/05/23 1611  CBC with Differential/Platelet  ONCE - STAT,   STAT        10/05/23 1611   10/05/23 1611  Phosphorus  ONCE - STAT,   STAT        10/05/23 1611   10/05/23 1609  Magnesium  Once,   R        10/05/23 1609   10/05/23 1607  HIV Antibody (routine testing w rflx)  (HIV Antibody (Routine testing w reflex) panel)  Once,   R        10/05/23 1609           Orders Placed This Encounter  Procedures   HIV Antibody (routine testing w rflx)   Comprehensive metabolic panel    CBC   Magnesium   CBC with Differential/Platelet   Phosphorus   D-dimer, quantitative   T4, free   TSH   CK   Diet regular Room service appropriate? Yes; Fluid consistency: Thin   Vital signs   Neuro checks   Cardiac monitoring   Activity as tolerated   Notify physician (specify)   Maintain IV access   Intake and output   Apply Seizure Disorder Care Plan   Do not place and if present remove PureWick   Initiate Oral Care Protocol   Initiate Carrier Fluid Protocol   Brush teeth with suction toothbrush 4 times daily   Full code   Consult to neurology Consult Timeframe: ROUTINE - requires response within 24 hours; Reason for Consult? rec seizures .   EKG 12-Lead  Admit to Inpatient (patient's expected length of stay will be greater than 2 midnights or inpatient only procedure)   Seizure precautions   Fall precautions   Aspiration precautions   Author: Lavanda Porter, MD 12 pm- 8 pm. [facility]. 10/05/2023 5:02 PM >>Please note for any concern,or critical results after hours past 8pm please contact the [facility] floor coverage provider from 7 PM- 7 AM. For on call review [email or web address], username [email or web address] and PW: your phone number<<.

## 2023-10-06 ENCOUNTER — Inpatient Hospital Stay (HOSPITAL_COMMUNITY)

## 2023-10-06 DIAGNOSIS — G40309 Generalized idiopathic epilepsy and epileptic syndromes, not intractable, without status epilepticus: Secondary | ICD-10-CM | POA: Diagnosis not present

## 2023-10-06 LAB — COMPREHENSIVE METABOLIC PANEL WITH GFR
ALT: 34 U/L (ref 0–44)
AST: 26 U/L (ref 15–41)
Albumin: 3.2 g/dL — ABNORMAL LOW (ref 3.5–5.0)
Alkaline Phosphatase: 73 U/L (ref 38–126)
Anion gap: 7 (ref 5–15)
BUN: 7 mg/dL (ref 6–20)
CO2: 22 mmol/L (ref 22–32)
Calcium: 9 mg/dL (ref 8.9–10.3)
Chloride: 108 mmol/L (ref 98–111)
Creatinine, Ser: 0.98 mg/dL (ref 0.44–1.00)
GFR, Estimated: >60 mL/min (ref >60)
Glucose, Bld: 107 mg/dL — ABNORMAL HIGH (ref 70–99)
Potassium: 3.2 mmol/L — ABNORMAL LOW (ref 3.5–5.1)
Sodium: 137 mmol/L (ref 135–145)
Total Bilirubin: 3.7 mg/dL — ABNORMAL HIGH (ref 0.0–1.2)
Total Protein: 6.5 g/dL (ref 6.5–8.1)

## 2023-10-06 LAB — CBC
HCT: 38.3 % (ref 36.0–46.0)
Hemoglobin: 12.3 g/dL (ref 12.0–15.0)
MCH: 30.4 pg (ref 26.0–34.0)
MCHC: 32.1 g/dL (ref 30.0–36.0)
MCV: 94.6 fL (ref 80.0–100.0)
Platelets: 150 10*3/uL (ref 150–400)
RBC: 4.05 MIL/uL (ref 3.87–5.11)
RDW: 14 % (ref 11.5–15.5)
WBC: 8 10*3/uL (ref 4.0–10.5)
nRBC: 0 % (ref 0.0–0.2)

## 2023-10-06 MED ORDER — AMLODIPINE BESYLATE 5 MG PO TABS
5.0000 mg | ORAL_TABLET | Freq: Every day | ORAL | Status: DC
  Administered 2023-10-07 – 2023-10-09 (×3): 5 mg via ORAL
  Filled 2023-10-06 (×3): qty 1

## 2023-10-06 MED ORDER — GADOBUTROL 1 MMOL/ML IV SOLN
8.0000 mL | Freq: Once | INTRAVENOUS | Status: AC | PRN
  Administered 2023-10-06: 8 mL via INTRAVENOUS

## 2023-10-06 MED ORDER — VITAMIN D (ERGOCALCIFEROL) 1.25 MG (50000 UNIT) PO CAPS
50000.0000 [IU] | ORAL_CAPSULE | ORAL | Status: DC
  Administered 2023-10-06: 50000 [IU] via ORAL
  Filled 2023-10-06: qty 1

## 2023-10-06 MED ORDER — SODIUM CHLORIDE 0.9 % IV SOLN
500.0000 mg | INTRAVENOUS | Status: DC
  Administered 2023-10-06: 500 mg via INTRAVENOUS
  Filled 2023-10-06 (×3): qty 5

## 2023-10-06 MED ORDER — LEVOTHYROXINE SODIUM 100 MCG PO TABS
100.0000 ug | ORAL_TABLET | Freq: Every day | ORAL | Status: DC
  Administered 2023-10-07 – 2023-10-09 (×3): 100 ug via ORAL
  Filled 2023-10-06 (×3): qty 1

## 2023-10-06 MED ORDER — ASPIRIN 325 MG PO TBEC
325.0000 mg | DELAYED_RELEASE_TABLET | Freq: Every day | ORAL | Status: DC
  Administered 2023-10-06 – 2023-10-09 (×4): 325 mg via ORAL
  Filled 2023-10-06 (×4): qty 1

## 2023-10-06 MED ORDER — VITAMIN B-12 100 MCG PO TABS
500.0000 ug | ORAL_TABLET | Freq: Every day | ORAL | Status: DC
  Administered 2023-10-06 – 2023-10-09 (×4): 500 ug via ORAL
  Filled 2023-10-06 (×5): qty 5

## 2023-10-06 MED ORDER — POTASSIUM CHLORIDE CRYS ER 20 MEQ PO TBCR
40.0000 meq | EXTENDED_RELEASE_TABLET | Freq: Once | ORAL | Status: AC
  Administered 2023-10-06: 40 meq via ORAL
  Filled 2023-10-06: qty 2

## 2023-10-06 MED ORDER — SODIUM CHLORIDE 0.9 % IV SOLN
2.0000 g | INTRAVENOUS | Status: DC
  Administered 2023-10-06 – 2023-10-09 (×4): 2 g via INTRAVENOUS
  Filled 2023-10-06 (×4): qty 20

## 2023-10-06 MED ORDER — LACTATED RINGERS IV SOLN: INTRAVENOUS | Status: DC

## 2023-10-06 NOTE — Progress Notes (Signed)
 PROGRESS NOTE    [name]  ZOX:[id number] DOB: 1965/11/28 DOA: 10/05/2023 PCP: Alto Atta, NP   Brief Narrative: 58 year old with past medical history significant for seizures, Bell's palsy, brain cancer, OSA, hypertension, hyperlipidemia, hypothyroidism, migraine headache presented with recurrent seizures episodes, witnessed by family member.    Assessment & Plan:   Principal Problem:   Generalized seizure disorder ([name]) Active Problems:   Seizure disorder ([name])   Essential hypertension   Hypothyroid   OSA (obstructive sleep apnea)   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)   Carcinoma of head of pancreas ([name])  1-Recurrent seizures episode Patient was noted to have 3 separate episode of tonic-clonic activity witnessed by daughter lasted for less than 5 minutes each episode. Patient is not able to carry a conversation, she have expressive aphasia since she had a stroke March of this year.  She is able to say her name. -Keppra  dose increased to 1,750 mg twice daily from 1500mg  BID.  -Vimpat  resume - EEG: Sharp waves left temporal region.  Breach artifact left temporal region.  No seizures were seen through recording. Per neurology  okay to discharge if no further seizure on current dose. Will get MRI, she has MRI schedule for 6/05.  Vitamin D  deficiency - Vitamin D  at 24.  Start supplements  Hypokalemia: - Replaced   Low normal B12: -replete Orally.   Hypothyroidism on Synthroid .  Not significantly elevated free T4.   Right leg pain: CK normal. Mild elevated D-dimer.  Will get Doppler  Pneumonia Dyspnea: Chest x-ray consistent with pneumonia, left lower lobe infiltrates -Speech to evaluate patient -Spiked a fever last night -Started on IV ceftriaxone and azithromycin   History of GBM: Glioblastoma with Isocitrate  dehydrogenase gene wild-type. Status post left temporal craniotomy resection by Dr. Dorothy Gates 11/2020 Follow-up  with Dr. Mark Sil. Was on Avastin , currently on hold, she is under surveillance  On Hydrocortisone .   History of CVA with baseline right-sided weakness: Resume aspirin .   Hypertension: Norvasc  and losartan  at home SBP 120 range. Holding Losartan . Norvasc  5 mg       Estimated body mass index is 27.72 kg/m as calculated from the following:   Height as of 10/02/23: 5\' 7"  (1.702 m).   Weight as of 10/02/23: 80.3 kg.   DVT prophylaxis: Heparin  Code Status: Full Code Family Communication: Daughter  Disposition Plan:  Status is: Inpatient Remains inpatient appropriate because: management of seizure, PNA    Consultants:  Neurology   Procedures:    Antimicrobials:  Ceftriaxone Azithro  Subjective: Patient is alert and expressive aphasia, this is her baseline. She was able to tell me her name.  She states yes to few questions.  Objective: Vitals:   10/05/23 1533 10/05/23 2054 10/06/23 0030 10/06/23 0427  BP: (!) (P) 143/85 134/76 129/76 124/73  Pulse: (!) (P) 109 (!) 111 (!) 113 (!) 115  Resp: (P) 18     Temp: (P) 98.4 F (36.9 C) (!) 101.4 F (38.6 C) 98.7 F (37.1 C) 98.8 F (37.1 C)  TempSrc: (P) Oral Oral Oral Oral  SpO2: (P) 100% 97% 90% 100%    Intake/Output Summary (Last 24 hours) at 10/06/2023 0756 Last data filed at 10/05/2023 1611 Gross per 24 hour  Intake 0 ml  Output --  Net 0 ml   There were no vitals filed for this visit.  Examination:  General exam: Appears calm and comfortable  Respiratory system: Clear to auscultation. Respiratory effort normal. Cardiovascular system: S1 & S2 heard, RRR.  No JVD, murmurs, rubs, gallops or clicks. No pedal edema. Gastrointestinal system: Abdomen is nondistended, soft and nontender. No organomegaly or masses felt. Normal bowel sounds heard. Central nervous system: Alert aphasic, right-sided weakness; RUE4/5 right LE 3/5 Extremities: Symmetric 5 x 5 power.     Data Reviewed: I have personally reviewed  following labs and imaging studies  CBC: Recent Labs  Lab 10/05/23 1846 10/06/23 0527  WBC 6.5 8.0  NEUTROABS 4.9  --   HGB 12.7 12.3  HCT 38.6 38.3  MCV 94.8 94.6  PLT 152 150   Basic Metabolic Panel: Recent Labs  Lab 10/05/23 1846 10/06/23 0527  NA  --  137  K  --  3.2*  CL  --  108  CO2  --  22  GLUCOSE  --  107*  BUN  --  7  CREATININE  --  0.98  CALCIUM   --  9.0  MG 2.0  --   PHOS 3.1  --    GFR: Estimated Creatinine Clearance: 70.8 mL/min (by C-G formula based on SCr of 0.98 mg/dL). Liver Function Tests: Recent Labs  Lab 10/06/23 0527  AST 26  ALT 34  ALKPHOS 73  BILITOT 3.7*  PROT 6.5  ALBUMIN  3.2*   No results for input(s): "LIPASE", "AMYLASE" in the last 168 hours. No results for input(s): "AMMONIA" in the last 168 hours. Coagulation Profile: No results for input(s): "INR", "PROTIME" in the last 168 hours. Cardiac Enzymes: Recent Labs  Lab 10/05/23 1846  CKTOTAL 118   BNP (last 3 results) No results for input(s): "PROBNP" in the last 8760 hours. HbA1C: No results for input(s): "HGBA1C" in the last 72 hours. CBG: No results for input(s): "GLUCAP" in the last 168 hours. Lipid Profile: No results for input(s): "CHOL", "HDL", "LDLCALC", "TRIG", "CHOLHDL", "LDLDIRECT" in the last 72 hours. Thyroid  Function Tests: Recent Labs    10/05/23 1846  TSH 0.430  FREET4 1.19*   Anemia Panel: No results for input(s): "VITAMINB12", "FOLATE", "FERRITIN", "TIBC", "IRON", "RETICCTPCT" in the last 72 hours. Sepsis Labs: No results for input(s): "PROCALCITON", "LATICACIDVEN" in the last 168 hours.  No results found for this or any previous visit (from the past 240 hours).       Radiology Studies: EEG adult Result Date: 10/05/2023 Arleene Lack, MD     10/05/2023  9:29 PM Patient Name: [name] MRN: [id number] Epilepsy Attending: Arleene Lack Referring Physician/Provider: Laymond Priestly, NP Date: 10/05/2023 Duration: 22.25 mins Patient  history: 58yo F with seizure history on vimpat  and keppra  at home, presented to OSH for 5-6 seizures. EEG to evaluate for seizure Level of alertness: Awake, asleep AEDs during EEG study: LEV, LCM Technical aspects: This EEG study was done with scalp electrodes positioned according to the 10-20 International system of electrode placement. Electrical activity was reviewed with band pass filter of 1-70Hz , sensitivity of 7 uV/mm, display speed of 74mm/sec with a 60Hz  notched filter applied as appropriate. EEG data were recorded continuously and digitally stored.  Video monitoring was available and reviewed as appropriate. Description: The posterior dominant rhythm consists of 8-9Hz  activity of moderate voltage (25-35 uV) seen predominantly in posterior head regions, asymmetric( left<right) and reactive to eye opening and eye closing. Sleep was characterized by vertex waves, sleep spindles (12 to 14 Hz), maximal frontocentral region. EEG showed continuous polymorphic rhythmic 3 to 6 Hz theta-delta slowing in left hemisphere. Additionally there was sharply contoured 3-6hz  theta-delta slowing left temporal region consistent with breach artifact. [place]  waves were noted in left temporal region, qasi-periodic at 1hz .  Hyperventilation and photic stimulation were not performed.   ABNORMALITY - Sharp wave, left temporal region - Breach artifact, left temporal region - Continuous slow, left hemisphere - Background asymmetry, left<right IMPRESSION: This study showed evidence of epileptogenicity arising from left temporal region with increased risk of seizure recurrence. Additionally there was cortical dysfunction arising from left temporal region consistent with underlying craniotomy. No seizures were seen throughout the recording. If suspicion for ictal activity remains a concern, a prolonged study can be considered. Priyanka O Yadav        Scheduled Meds:  amLODipine   10 mg Oral Daily   docusate sodium   100 mg Oral BID    heparin   5,000 Units Subcutaneous Q8H   hydrocortisone   10 mg Oral Daily   lacosamide   200 mg Oral BID   levETIRAcetam   1,750 mg Oral BID   Continuous Infusions:   LOS: 1 day    Time spent: 35 Minutes    Coulter Oldaker A [place] Dost, MD [facility]   If 7PM-7AM, please contact night-coverage [email or web address]  10/06/2023, 7:56 AM

## 2023-10-06 NOTE — Evaluation (Signed)
 Clinical/Bedside Swallow Evaluation Patient Details  Name: [name] MRN: [id number] Date of Birth: 1966-01-16  Today's Date: 10/06/2023 Time: SLP Start Time (ACUTE ONLY): 1515 SLP Stop Time (ACUTE ONLY): 1540 SLP Time Calculation (min) (ACUTE ONLY): 25 min  Past Medical History:  Past Medical History:  Diagnosis Date   Bell's palsy    left side of face- notied in smile and eyes, if very tired   Brain cancer ([name])    Complication of anesthesia    N/V   Essential hypertension    External hemorrhoids    History of blood transfusion    post op Hemorroids   History of pneumonia    Hyperlipidemia    Hyperthyroidism    hx 58 years old- oral meds   Iron deficiency anemia    Migraine headache    Pneumonia    x 2 - years ago   PONV (postoperative nausea and vomiting)    PPD positive    Pulmonary nodules    not seen on plain cxr, first detected 1/07 re ct 10/08 pos IPPD > 15 mm 12/09 FOB/lavage 04/20/08 neg afb smear   PVC's (premature ventricular contractions)    Seizure ([name]) 11/15/2020   Sleep apnea    refused CPAP- mouth piece recommended   Tuberculosis    in her late 30's   Past Surgical History:  Past Surgical History:  Procedure Laterality Date   ABDOMINAL HYSTERECTOMY     APPLICATION OF CRANIAL NAVIGATION N/A 11/21/2020   Procedure: APPLICATION OF CRANIAL NAVIGATION;  Surgeon: Cannon Champion, MD;  Location: [facility] OR;  Service: Neurosurgery;  Laterality: N/A;   BREAST BIOPSY Right 2014   BREAST BIOPSY Left 10/29/2019   CHOLECYSTECTOMY  2014   COLONOSCOPY  05/04/2009   prolapsing external hemorrhoids   CRANIOTOMY Left 11/21/2020   Procedure: Left craniotomy for tumor resection;  Surgeon: Cannon Champion, MD;  Location: [facility] OR;  Service: Neurosurgery;  Laterality: Left;   ENDOMETRIAL ABLATION     HEMORRHOID SURGERY     x 3   TUBAL LIGATION     HPI:  Patient is a 58 y.o. female with PMH: Bell's palsy, brain cancer s/p left craniotomy in 2022, essential  HTN, HLD, hyperthyroidism, iron deficiency anemia, , pulmonary nodules, migraine HA, OSA, seizure. She presented to the hospital on 10/05/23 after about 5-6 seizures at home previous date with each seizure lasting less than 5 minutes. She was transferred to [facility] for further management.    Assessment / Plan / Recommendation  Clinical Impression  Patient presents with clinical s/s of dysphagia as per this bedside swallow evaluation. She exhibited swallow initiation delay with liquids but no overt s/s aspiration. When masticating saltine cracker, she exhibited impaired mastication and delayed cough. She did not tolerate puree solids, holding in mouth and indicating she couldnt swallow; SLP directed her to spit out into towel which she did. SLP is recommending to downgrade diet to dys 2 (minced) solids, thin liquids. SLP will follow for toleration, ability to advance. SLP Visit Diagnosis: Dysphagia, unspecified (R13.10)    Aspiration Risk  Mild aspiration risk    Diet Recommendation Dysphagia 2 (Fine chop);Thin liquid    Liquid Administration via: Cup Medication Administration: Other (Comment) (as tolerated) Supervision: Patient able to self feed;Full supervision/cueing for compensatory strategies Compensations: Slow rate;Small sips/bites Postural Changes: Seated upright at 90 degrees    Other  Recommendations Oral Care Recommendations: Oral care BID    Recommendations for follow up therapy are one component of  a multi-disciplinary discharge planning process, led by the attending physician.  Recommendations may be updated based on patient status, additional functional criteria and insurance authorization.  Follow up Recommendations Other (comment) (TBD)      Assistance Recommended at Discharge    Functional Status Assessment Patient has had a recent decline in their functional status and demonstrates the ability to make significant improvements in function in a reasonable and predictable amount  of time.  Frequency and Duration min 2x/week  1 week       Prognosis Prognosis for improved oropharyngeal function: Good Barriers to Reach Goals: Cognitive deficits      Swallow Study   General Date of Onset: 10/05/23 HPI: Patient is a 58 y.o. female with PMH: Bell's palsy, brain cancer s/p left craniotomy in 2022, essential HTN, HLD, hyperthyroidism, iron deficiency anemia, , pulmonary nodules, migraine HA, OSA, seizure. She presented to the hospital on 10/05/23 after about 5-6 seizures at home previous date with each seizure lasting less than 5 minutes. She was transferred to [facility] for further management. Type of Study: Bedside Swallow Evaluation Previous Swallow Assessment: none found Diet Prior to this Study: Regular;Thin liquids (Level 0) Temperature Spikes Noted: No Respiratory Status: Room air History of Recent Intubation: No Behavior/Cognition: Alert;Cooperative;Requires cueing Oral Cavity Assessment: Within Functional Limits Oral Care Completed by SLP: Recent completion by staff Oral Cavity - Dentition: Adequate natural dentition Vision: Functional for self-feeding Self-Feeding Abilities: Able to feed self;Needs set up Patient Positioning: Upright in bed Baseline Vocal Quality: Low vocal intensity Volitional Cough: Cognitively unable to elicit Volitional Swallow: Unable to elicit    Oral/Motor/Sensory Function Overall Oral Motor/Sensory Function: Mild impairment Facial ROM: Reduced left Facial Symmetry: Abnormal symmetry left Facial Strength: Reduced left   Ice Chips     Thin Liquid Thin Liquid: Impaired Presentation: Cup;Self Fed Pharyngeal  Phase Impairments: Suspected delayed Swallow    Nectar Thick     Honey Thick     Puree Other Comments: patient accepted spoon of applesauce but held in mouth and then spit it out   Solid     Solid: Impaired Presentation: Self Fed Oral Phase Impairments: Impaired mastication Pharyngeal Phase Impairments: Cough - Delayed      Jacqualine Mater, MA, CCC-SLP Speech Therapy

## 2023-10-06 NOTE — Progress Notes (Signed)
 PT Cancellation Note  Patient Details Name: [name] MRN: [id number] DOB: Aug 03, 1965   Cancelled Treatment:    Reason Eval/Treat Not Completed: Patient declined, no reason specified (Pt politely requests PT to return tomorrow. Will follow up tomorrow.)   [name] 10/06/2023, 3:08 PM

## 2023-10-07 ENCOUNTER — Encounter (HOSPITAL_COMMUNITY)

## 2023-10-07 DIAGNOSIS — G40309 Generalized idiopathic epilepsy and epileptic syndromes, not intractable, without status epilepticus: Secondary | ICD-10-CM | POA: Diagnosis not present

## 2023-10-07 LAB — BASIC METABOLIC PANEL WITH GFR
Anion gap: 15 (ref 5–15)
BUN: 8 mg/dL (ref 6–20)
CO2: 22 mmol/L (ref 22–32)
Calcium: 9.5 mg/dL (ref 8.9–10.3)
Chloride: 102 mmol/L (ref 98–111)
Creatinine, Ser: 0.89 mg/dL (ref 0.44–1.00)
GFR, Estimated: >60 mL/min (ref >60)
Glucose, Bld: 79 mg/dL (ref 70–99)
Potassium: 3.4 mmol/L — ABNORMAL LOW (ref 3.5–5.1)
Sodium: 139 mmol/L (ref 135–145)

## 2023-10-07 LAB — CBC
HCT: 35.1 % — ABNORMAL LOW (ref 36.0–46.0)
Hemoglobin: 11.3 g/dL — ABNORMAL LOW (ref 12.0–15.0)
MCH: 30.5 pg (ref 26.0–34.0)
MCHC: 32.2 g/dL (ref 30.0–36.0)
MCV: 94.6 fL (ref 80.0–100.0)
Platelets: 145 10*3/uL — ABNORMAL LOW (ref 150–400)
RBC: 3.71 MIL/uL — ABNORMAL LOW (ref 3.87–5.11)
RDW: 13.9 % (ref 11.5–15.5)
WBC: 7.5 10*3/uL (ref 4.0–10.5)
nRBC: 0 % (ref 0.0–0.2)

## 2023-10-07 LAB — MAGNESIUM: Magnesium: 2 mg/dL (ref 1.7–2.4)

## 2023-10-07 MED ORDER — POTASSIUM CHLORIDE CRYS ER 20 MEQ PO TBCR
40.0000 meq | EXTENDED_RELEASE_TABLET | Freq: Two times a day (BID) | ORAL | Status: AC
  Administered 2023-10-07 (×2): 40 meq via ORAL
  Filled 2023-10-07 (×2): qty 2

## 2023-10-07 MED ORDER — AZITHROMYCIN 500 MG PO TABS
500.0000 mg | ORAL_TABLET | Freq: Every day | ORAL | Status: DC
  Administered 2023-10-07 – 2023-10-09 (×3): 500 mg via ORAL
  Filled 2023-10-07 (×3): qty 1

## 2023-10-07 NOTE — TOC CM/SW Note (Signed)
 Transition of Care [facility]) - Inpatient Brief Assessment   Patient Details  Name: [name] MRN: [id number] Date of Birth: Sep 11, 1965  Transition of Care [facility]) CM/SW Contact:    Jonathan Neighbor, RN Phone Number: 10/07/2023, 4:28 PM   Clinical Narrative:  Current recommendations are for CIR. Awaiting eval.  TOC following.  Transition of Care Asessment: Insurance and Status: Insurance coverage has been reviewed Patient has primary care physician: Yes Home environment has been reviewed: home with daughter   Prior/Current Home Services: No current home services Social Drivers of Health Review: SDOH reviewed no interventions necessary Readmission risk has been reviewed: Yes Transition of care needs: transition of care needs identified, TOC will continue to follow

## 2023-10-07 NOTE — Evaluation (Signed)
 Occupational Therapy Evaluation Patient Details Name: [name] MRN: [id number] DOB: 26-Feb-1966 Today's Date: 10/07/2023   History of Present Illness   The pt is a 58 yo female presenting 6/1 with reports of 3 seizures at home. EEG showed  epileptogenicity arising from left temporal region with increased risk of seizure recurrence. MRI shows mildly increased enhancement and T2 hyperintensity in the  left inferior frontal gyrus. PMH includes: seizures, Bell's palsy, brain cancer s/p crani for L temporal mass resection in 2022, CVA with R-sided weakness and aphasia, HTN, HLD, hyperthyroidism, PVCs, OSA, TB.     Clinical Impressions Patient is currently requiring as high as moderate assistance with basic ADLs, as well as  contact guard assist with bed mobility (sit to supine) and up to moderate assist with functional transfers to bathroom with use of RW and need of continuous cues for sequencing steps and attending to RT hand grip on RW.   Current level of function is below patient's typical baseline.    During this evaluation, patient was limited by RT hemiparesis in setting of generalized weakness, impaired activity tolerance, and expressive difficulties, all of which has the potential to impact patient's and/or caregivers' safety and independence during functional mobility, as well as performance for ADLs.    Patient lives with her daughter who, along with family members  are able to provide 24/7 supervision and assistance.  Patient demonstrates very good rehab potential, and should benefit from continued skilled occupational therapy services while in acute care to maximize safety, independence and quality of life at home.  Continued acute occupational therapy services are recommended. Patient will benefit from intensive inpatient follow-up therapy, >3 hours/day.    ?      If plan is discharge home, recommend the following:   Help with stairs or ramp for entrance;Assist for  transportation;Assistance with cooking/housework;Supervision due to cognitive status;Direct supervision/assist for medications management;Direct supervision/assist for financial management;A lot of help with bathing/dressing/bathroom;A lot of help with walking and/or transfers     Functional Status Assessment   Patient has had a recent decline in their functional status and demonstrates the ability to make significant improvements in function in a reasonable and predictable amount of time.     Equipment Recommendations    (If does not go ARI, recommend RW, manual WC with leg supports, Bedside commode.)     Recommendations for Other Services   Rehab consult     Precautions/Restrictions   Precautions Precautions: Fall Recall of Precautions/Restrictions: Impaired Precaution/Restrictions Comments: seizure Restrictions Weight Bearing Restrictions Per Provider Order: No     Mobility Bed Mobility Overal bed mobility: Needs Assistance Bed Mobility: Sit to Supine       Sit to supine: Contact guard assist, HOB elevated, Used rails                                  Balance Overall balance assessment: Needs assistance Sitting-balance support: No upper extremity supported, Feet supported Sitting balance-Leahy Scale: Fair     Standing balance support: Bilateral upper extremity supported, Reliant on assistive device for balance, During functional activity Standing balance-Leahy Scale: Poor Standing balance comment: dependent on BUE support and minA               High Level Balance Comments: Unable to side step/motor plan.           ADL either performed or assessed with clinical judgement   ADL Overall  ADL's : Needs assistance/impaired Eating/Feeding: Minimal assistance;Sitting Eating/Feeding Details (indicate cue type and reason): Favors LT hand for feeding. Grooming: Wash/dry hands;Sitting;Set up;Cueing for sequencing;Supervision/safety   Upper  Body Bathing: Minimal assistance;Sitting;Cueing for sequencing   Lower Body Bathing: Moderate assistance;Sit to/from stand;Sitting/lateral leans   Upper Body Dressing : Moderate assistance;Cueing for sequencing;Sitting Upper Body Dressing Details (indicate cue type and reason): Pt unable to problem solve doffing posterior gown while seated EOB. Required Mod As. Lower Body Dressing: Sitting/lateral leans;Moderate assistance;Cueing for sequencing;Cueing for compensatory techniques Lower Body Dressing Details (indicate cue type and reason): Pt able to acheive figure 4 on LT LE and doffed/donned sock with CGA to keep LLE in place.  Less ability to figure 4 on RLE with need of Mod As to hold RLE in place and to don sock. Toilet Transfer: Minimal assistance;Moderate assistance;Rolling walker (2 wheels);Cueing for sequencing;Cueing for safety Toilet Transfer Details (indicate cue type and reason): Pt stood from recliner to RW with cues and Min as. Pt ambulated around bed for total of ~14' with increased time, step by step cues for sequencing, cues for LT  hand grip on RW and Mod As. Min As for descnt to EOB with cues. Toileting- Clothing Manipulation and Hygiene: Moderate assistance;Sit to/from stand;Sitting/lateral lean       Functional mobility during ADLs: Minimal assistance;Moderate assistance;Rolling walker (2 wheels);Cueing for sequencing;Cueing for safety       Vision Ability to See in Adequate Light: 2 Moderately impaired Patient Visual Report: Peripheral vision impairment Vision Assessment?: Yes;Vision impaired- to be further tested in functional context Eye Alignment: Impaired (comment) Tracking/Visual Pursuits: Decreased smoothness of eye movement to RIGHT inferior field;Decreased smoothness of eye movement to RIGHT superior field;Decreased smoothness of horizontal tracking;Requires cues, head turns, or add eye shifts to track Visual Fields: Right visual field deficit Additional  Comments: Absent peripheral vision to RT visual field. Saccades noted with test for smooth pursuits and pt fatigued quickly with test.  OT standing ~6' away pt able to identify correct number of fingers held in front of her in all visual planes.     Perception         Praxis Praxis: Impaired Praxis Impairment Details: Motor planning     Pertinent Vitals/Pain Pain Assessment Pain Assessment: No/denies pain     Extremity/Trunk Assessment Upper Extremity Assessment Upper Extremity Assessment: RUE deficits/detail;Generalized weakness;LUE deficits/detail RUE Deficits / Details: Shoulder AROM limited to ~90. PROM tolerated to ~110, then pt with facial grimace. MMT: Shoulder, tricep: 2/5, bicep 2+/5, wrist 2/5, finger extension 3-/5, grip 3-/5. Slower to activate, impaired coordination. Per family, not at baseline. RUE Sensation: decreased light touch RUE Coordination: decreased fine motor;decreased gross motor LUE Deficits / Details: AROM: WFL. Able to oppose but with increased time and cues needed for accuracy. MMT: grossly 4-/5 LUE Coordination: decreased fine motor   Lower Extremity Assessment Lower Extremity Assessment: Generalized weakness RLE Deficits / Details: grossly 3+/5 to ankle, 4-/5 to knee and 3+/5 at hip. reports pins and needles in foot, sensation normal at knee/thigh RLE Sensation: decreased light touch RLE Coordination: decreased fine motor LLE Deficits / Details: grossly 4-5 to MMT, reports sensation intact LLE Coordination: WNL   Cervical / Trunk Assessment Cervical / Trunk Assessment: Kyphotic   Communication Communication Communication: Impaired Factors Affecting Communication: Difficulty expressing self;Reduced clarity of speech   Cognition Arousal: Alert Behavior During Therapy: Flat affect Cognition: Difficult to assess Difficult to assess due to: Impaired communication  OT - Cognition Comments: Pt with expressive difficulties but seems to  follow instructions well.  Pt unable to state last name, place of DOB. Pt could finish year of birth with OT prompt of, "[birth year]...", pt replyed "[birth year]".                 Following commands: Impaired Following commands impaired: Follows one step commands with increased time, Follows multi-step commands inconsistently     Cueing  General Comments   Cueing Techniques: Verbal cues;Gestural cues;Tactile cues  VSS on RA0 HR 111 after activity with recovery to 90s once back in supine. , no family present, pt with expressive difficulties   Exercises Other Exercises Other Exercises: Pt shown unilateral and bilateral hand FMC activities to begin with family observing and stating understanding. Pt worked on LT in Scientist, physiological of objects, using LT hand to stabilize object and allow rt hand to assist. Pt worked on hold tape roll in LT hand and tearing with RT. Family provided with verbal ideas on activiites to work on with pt.   Shoulder Instructions      Home Living Family/patient expects to be discharged to:: Private residence Living Arrangements: Children;Other relatives;Parent Available Help at Discharge: Family;Available 24 hours/day Type of Home: House Home Access: Stairs to enter [employer] of Steps: 2-3 Entrance Stairs-Rails: Can reach both;Left;Right Home Layout: One level     Bathroom Shower/Tub: Chief Strategy Officer: Standard     Home Equipment: Shower seat   Additional Comments: Family present at time of OT Evaluation and corrected some of pt's information from this AM with physical therapy. Pt has access to a RW and her church can loan a WC.      Prior Functioning/Environment Prior Level of Function : Independent/Modified Independent             Mobility Comments: pt reports daughter present for all mobility, not using DME, no falls. does state she exercises 2-3 days/week ADLs Comments: pt reports daughter is present and supervises for  safety with ADLs, assists with IADLs. Per family, pt performs all BADLs with supervision only. Family provides IADLs, medication management and transportation.    OT Problem List: Decreased coordination;Impaired sensation;Cardiopulmonary status limiting activity;Impaired vision/perception;Impaired balance (sitting and/or standing);Decreased activity tolerance;Decreased knowledge of use of DME or AE;Impaired UE functional use;Decreased range of motion;Decreased strength;Impaired tone   OT Treatment/Interventions: Self-care/ADL training;Therapeutic activities;Cognitive remediation/compensation;Therapeutic exercise;Neuromuscular education;Visual/perceptual remediation/compensation;Patient/family education;Energy conservation;DME and/or AE instruction;Balance training;Manual therapy      OT Goals(Current goals can be found in the care plan section)   Acute Rehab OT Goals Patient Stated Goal: Daughter agreeable for trying for AIR. OT Goal Formulation: With patient/family Time For Goal Achievement: 10/21/23 Potential to Achieve Goals: Good ADL Goals Pt Will Perform Grooming: with supervision;standing;with adaptive equipment (For at least 2 tasks with VSS and RPE no greater than 4/10) Pt Will Perform Lower Body Dressing: sitting/lateral leans;sit to/from stand;bed level;with set-up;with supervision;with adaptive equipment Pt Will Transfer to Toilet: with supervision;ambulating Pt Will Perform Toileting - Clothing Manipulation and hygiene: with supervision;with adaptive equipment;sitting/lateral leans;sit to/from stand Pt/caregiver will Perform Home Exercise Program: With Supervision;Increased ROM;Increased strength;Both right and left upper extremity (NMR, stretching, Fine motor/gross motor activities please.) Additional ADL Goal #1: Pt will demonstrate compensatory head movements and tracking during ADLs and mobility to increase awareness of items and obstacles on pt's RT side. Pt will identify all  objects on bedside tray, including RT sided objects by turning head or tracking as needed.  OT Frequency:  Min 3X/week    Co-evaluation              AM-PAC OT "6 Clicks" Daily Activity     Outcome Measure Help from another person eating meals?: A Little Help from another person taking care of personal grooming?: A Little Help from another person toileting, which includes using toliet, bedpan, or urinal?: A Lot Help from another person bathing (including washing, rinsing, drying)?: A Lot Help from another person to put on and taking off regular upper body clothing?: A Lot Help from another person to put on and taking off regular lower body clothing?: A Lot 6 Click Score: 14   End of Session Equipment Utilized During Treatment: Gait belt;Rolling walker (2 wheels)  Activity Tolerance: Patient tolerated treatment well Patient left: in bed;with call bell/phone within reach;with bed alarm set;with family/visitor present  OT Visit Diagnosis: Unsteadiness on feet (R26.81);Hemiplegia and hemiparesis;Muscle weakness (generalized) (M62.81);Cognitive communication deficit (R41.841) Symptoms and signs involving cognitive functions: Other cerebrovascular disease Hemiplegia - Right/Left: Right Hemiplegia - dominant/non-dominant: Dominant Hemiplegia - caused by: Other cerebrovascular disease                Time: 1340-1430 OT Time Calculation (min): 50 min Charges:  OT General Charges $OT Visit: 1 Visit OT Evaluation $OT Eval Moderate Complexity: 1 Mod OT Treatments $Therapeutic Activity: 8-22 mins $Neuromuscular Re-education: 8-22 mins  [facility], OT Acute Rehab Services Office: [phone] 10/07/2023   Asher Blade 10/07/2023, 3:02 PM

## 2023-10-07 NOTE — Progress Notes (Signed)

## 2023-10-07 NOTE — Progress Notes (Signed)
 Speech Language Pathology Treatment: Dysphagia  Patient Details Name: [name] MRN: [id number] DOB: 08-24-1965 Today's Date: 10/07/2023 Time: 8295-6213 SLP Time Calculation (min) (ACUTE ONLY): 9 min  Assessment / Plan / Recommendation Clinical Impression  Pt seen for dysphagia intervention alert, upright in chair. There were no s/s aspiration with straw sips thin consistently. She was noted to have a dry cough prior to po's. Mastication of solid texture was functional without residue however with current status will upgrade slowly to Dys 3 (chopped meats). Continue thin, pills with thin liquid. ST will continue to follow for texture upgrade if appropriate.    HPI HPI: Patient is a 58 y.o. female with PMH: Bell's palsy, brain cancer s/p left craniotomy in 2022, essential HTN, HLD, per MD aphasia since stroke in March, hyperthyroidism, iron deficiency anemia, , pulmonary nodules, migraine HA, OSA, seizure. She presented to the hospital on 10/05/23 after about 5-6 seizures at home previous date with each seizure lasting less than 5 minutes. She was transferred to [facility] for further management.      SLP Plan  Continue with current plan of care      Recommendations for follow up therapy are one component of a multi-disciplinary discharge planning process, led by the attending physician.  Recommendations may be updated based on patient status, additional functional criteria and insurance authorization.    Recommendations  Diet recommendations: Dysphagia 3 (mechanical soft);Thin liquid Liquids provided via: Cup;Straw Medication Administration: Whole meds with liquid Supervision: Patient able to self feed Compensations: Slow rate;Small sips/bites Postural Changes and/or Swallow Maneuvers: Seated upright 90 degrees                  Oral care BID   Frequent or constant Supervision/Assistance Dysphagia, unspecified (R13.10)     Continue with current plan of care     [name]  10/07/2023, 9:58 AM

## 2023-10-07 NOTE — Progress Notes (Addendum)
 PROGRESS NOTE    SUSSAN METER  ZOX:[id number] DOB: 1965-08-26 DOA: 10/05/2023 PCP: Alto Atta, NP   Brief Narrative: 58 year old with past medical history significant for seizures, Bell's palsy, brain cancer, OSA, hypertension, hyperlipidemia, hypothyroidism, migraine headache presented with recurrent seizures episodes, witnessed by family member.  Patient was also found to have pneumonia.  She was started on IV antibiotics. No further  Seizure noticed.   Assessment & Plan:   Principal Problem:   Generalized seizure disorder ([name]) Active Problems:   Seizure disorder ([name])   Essential hypertension   Hypothyroid   OSA (obstructive sleep apnea)   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)   Carcinoma of head of pancreas ([name])  1-Recurrent seizures episode Patient was noted to have 3 separate episode of tonic-clonic activity witnessed by daughter lasted for less than 5 minutes each episode. Patient is not able to carry a conversation, she have expressive aphasia since she had a stroke March of this year.  She is able to say her name. -Keppra  dose increased to 1,750 mg twice daily from 1500mg  BID.  -Continue with Vimpat  - EEG: Sharp waves left temporal region.  Breach artifact left temporal region.  No seizures were seen through recording. Per neurology  okay to discharge if no further seizure on current dose. -MRI brain: Interval mildly increased enhancement and T2 hyperintensity in the left inferior frontal gyrus.  Finding could represent continued evolution of treatment changes or slow progressive of disease.  Recommend continued follow-up.  Dr. Mark Sil will review MRI. Addendum: Dr Mark Sil review MRI, looks stable, no concerning changes.   Vitamin D  deficiency - Vitamin D  at 24.  Started supplements  Hypokalemia: - Replete orall.  -Check Mg  Low normal B12: -replete Orally.   Hypothyroidism on Synthroid .  Not significantly elevated free T4.    Right leg pain: CK normal. Mild elevated D-dimer.   Doppler pending  Pneumonia Dyspnea: Chest x-ray consistent with pneumonia, left lower lobe infiltrates -Speech to evaluate patient -Spiked a fever last night -Started on IV ceftriaxone and azithromycin   History of GBM: Glioblastoma with Isocitrate  dehydrogenase gene wild-type. Status post left temporal craniotomy resection by Dr. Dorothy Gates 11/2020 Follow-up with Dr. Mark Sil. Was on Avastin , currently on hold, she is under surveillance  On Hydrocortisone .  MRI brain, See report above. Dr Mark Sil will review MRI  History of CVA with baseline right-sided weakness: Resume aspirin .   Hypertension: Norvasc  and losartan  at home SBP 120 range. Holding Losartan . Continue with  Norvasc  5 mg       Estimated body mass index is 27.72 kg/m as calculated from the following:   Height as of 10/02/23: 5\' 7"  (1.702 m).   Weight as of 10/02/23: 80.3 kg.   DVT prophylaxis: Heparin  Code Status: Full Code Family Communication: Daughter 6/03 Disposition Plan:  Status is: Inpatient Remains inpatient appropriate because: management of seizure, PNA    Consultants:  Neurology   Procedures:    Antimicrobials:  Ceftriaxone Azithro  Subjective: She is alert, sitting in the recliner, she follows some commands.  She has expressive aphasia.  She is only able to say her name and her age.  Objective: Vitals:   10/07/23 0034 10/07/23 0353 10/07/23 0825 10/07/23 1122  BP: 123/79 113/79 130/85 130/86  Pulse: 94 89 92 98  Resp: 18 18 20 16   Temp: 98 F (36.7 C) (!) 97.5 F (36.4 C) 98.8 F (37.1 C) 98.9 F (37.2 C)  TempSrc: Oral Oral Oral Oral  SpO2: 92% 93%  95% 96%    Intake/Output Summary (Last 24 hours) at 10/07/2023 1154 Last data filed at 10/07/2023 1029 Gross per 24 hour  Intake 1490.85 ml  Output 550 ml  Net 940.85 ml   There were no vitals filed for this visit.  Examination:  General exam: NAD Respiratory system:  CTA Cardiovascular system: S 1, S 2 RRR Gastrointestinal system: BS present, soft,nt Central nervous system: Alert aphasic, right-sided weakness; RUE4/5 right LE 3/5 Extremities: no edema     Data Reviewed: I have personally reviewed following labs and imaging studies  CBC: Recent Labs  Lab 10/05/23 1846 10/06/23 0527 10/07/23 0802  WBC 6.5 8.0 7.5  NEUTROABS 4.9  --   --   HGB 12.7 12.3 11.3*  HCT 38.6 38.3 35.1*  MCV 94.8 94.6 94.6  PLT 152 150 145*   Basic Metabolic Panel: Recent Labs  Lab 10/05/23 1846 10/06/23 0527 10/07/23 0802  NA  --  137 139  K  --  3.2* 3.4*  CL  --  108 102  CO2  --  22 22  GLUCOSE  --  107* 79  BUN  --  7 8  CREATININE  --  0.98 0.89  CALCIUM   --  9.0 9.5  MG 2.0  --   --   PHOS 3.1  --   --    GFR: Estimated Creatinine Clearance: 77.9 mL/min (by C-G formula based on SCr of 0.89 mg/dL). Liver Function Tests: Recent Labs  Lab 10/06/23 0527  AST 26  ALT 34  ALKPHOS 73  BILITOT 3.7*  PROT 6.5  ALBUMIN  3.2*   No results for input(s): "LIPASE", "AMYLASE" in the last 168 hours. No results for input(s): "AMMONIA" in the last 168 hours. Coagulation Profile: No results for input(s): "INR", "PROTIME" in the last 168 hours. Cardiac Enzymes: Recent Labs  Lab 10/05/23 1846  CKTOTAL 118   BNP (last 3 results) No results for input(s): "PROBNP" in the last 8760 hours. HbA1C: No results for input(s): "HGBA1C" in the last 72 hours. CBG: No results for input(s): "GLUCAP" in the last 168 hours. Lipid Profile: No results for input(s): "CHOL", "HDL", "LDLCALC", "TRIG", "CHOLHDL", "LDLDIRECT" in the last 72 hours. Thyroid  Function Tests: Recent Labs    10/05/23 1846  TSH 0.430  FREET4 1.19*   Anemia Panel: No results for input(s): "VITAMINB12", "FOLATE", "FERRITIN", "TIBC", "IRON", "RETICCTPCT" in the last 72 hours. Sepsis Labs: No results for input(s): "PROCALCITON", "LATICACIDVEN" in the last 168 hours.  No results found for  this or any previous visit (from the past 240 hours).       Radiology Studies: MR BRAIN W WO CONTRAST Result Date: 10/07/2023 CLINICAL DATA:  Brain/CNS neoplasm, assess treatment response 58 year old female with glioblastoma, IDH wild-type. Left temporal craniotomy, IMRT in 2022. Chemotherapy continues, repeat radiation therapy in February this year EXAM: MRI HEAD WITHOUT AND WITH CONTRAST TECHNIQUE: Multiplanar, multiecho pulse sequences of the brain and surrounding structures were obtained without and with intravenous contrast. CONTRAST:  8mL GADAVIST  GADOBUTROL  1 MMOL/ML IV SOLN COMPARISON:  MRI August 14, 2023. FINDINGS: Brain: Continued fading of restricted diffusion in the left corona radiata with similar restricted diffusion in the left periatrial region. There is mildly increased patchy and curvilinear enhancement anteriorly in the left inferior frontal gyrus with similar left periatrial/corona radiata patchy enhancement. No substantial change in areas of intrinsic T1 hyperintensity in these regions. Question mildly progressed T2/FLAIR hyperintensity also in the inferior left frontal gyrus. Additional T2/FLAIR hyperintensities in the left cerebral  hemisphere are not substantially changed. No significant mass effect or midline shift. No evidence of acute hemorrhage or hydrocephalus. Similar anterior left temporal lobe resection site without abnormal enhancement in this region. Vascular: Major arterial flow voids are maintained skull base. Skull and upper cervical spine: Stable left-sided craniotomy. Otherwise, unremarkable bone marrow. Sinuses/Orbits: Clear sinuses.  No acute findings. Other: No mastoid effusions. IMPRESSION: Interval mildly increased enhancement and T2 hyperintensity in the left inferior frontal gyrus. Findings could represent continued evolution of treatment change or slow progression of disease. Recommend continued imaging follow-up. Electronically Signed   By: Stevenson Elbe M.D.    On: 10/07/2023 03:06   DG CHEST PORT 1 VIEW Result Date: 10/06/2023 CLINICAL DATA:  Dyspnea EXAM: PORTABLE CHEST 1 VIEW COMPARISON:  February 16, 2022 FINDINGS: Left lower lobe infiltrates and atelectasis with a small effusion could correlate with pneumonia IMPRESSION: Left lower lobe infiltrates and atelectasis with a small effusion could correlate with pneumonia. Electronically Signed   By: Fredrich Jefferson M.D.   On: 10/06/2023 08:45   EEG adult Result Date: 10/05/2023 Arleene Lack, MD     10/05/2023  9:29 PM Patient Name: GENAVIEVE MANGIAPANE MRN: [id number] Epilepsy Attending: Arleene Lack Referring Physician/Provider: Laymond Priestly, NP Date: 10/05/2023 Duration: 22.25 mins Patient history: 58yo F with seizure history on vimpat  and keppra  at home, presented to OSH for 5-6 seizures. EEG to evaluate for seizure Level of alertness: Awake, asleep AEDs during EEG study: LEV, LCM Technical aspects: This EEG study was done with scalp electrodes positioned according to the 10-20 International system of electrode placement. Electrical activity was reviewed with band pass filter of 1-70Hz , sensitivity of 7 uV/mm, display speed of 73mm/sec with a 60Hz  notched filter applied as appropriate. EEG data were recorded continuously and digitally stored.  Video monitoring was available and reviewed as appropriate. Description: The posterior dominant rhythm consists of 8-9Hz  activity of moderate voltage (25-35 uV) seen predominantly in posterior head regions, asymmetric( left<right) and reactive to eye opening and eye closing. Sleep was characterized by vertex waves, sleep spindles (12 to 14 Hz), maximal frontocentral region. EEG showed continuous polymorphic rhythmic 3 to 6 Hz theta-delta slowing in left hemisphere. Additionally there was sharply contoured 3-6hz  theta-delta slowing left temporal region consistent with breach artifact. Sharp waves were noted in left temporal region, qasi-periodic at 1hz .  Hyperventilation and  photic stimulation were not performed.   ABNORMALITY - Sharp wave, left temporal region - Breach artifact, left temporal region - Continuous slow, left hemisphere - Background asymmetry, left<right IMPRESSION: This study showed evidence of epileptogenicity arising from left temporal region with increased risk of seizure recurrence. Additionally there was cortical dysfunction arising from left temporal region consistent with underlying craniotomy. No seizures were seen throughout the recording. If suspicion for ictal activity remains a concern, a prolonged study can be considered. Priyanka O Yadav        Scheduled Meds:  amLODipine   5 mg Oral Daily   aspirin  EC  325 mg Oral Daily   azithromycin   500 mg Oral Daily   docusate sodium   100 mg Oral BID   heparin   5,000 Units Subcutaneous Q8H   hydrocortisone   10 mg Oral Daily   lacosamide   200 mg Oral BID   levETIRAcetam   1,750 mg Oral BID   levothyroxine   100 mcg Oral Q0600   potassium chloride   40 mEq Oral BID   vitamin B-12  500 mcg Oral Daily   Vitamin D  (Ergocalciferol )  50,000 Units  Oral Q7 days   Continuous Infusions:  cefTRIAXone (ROCEPHIN)  IV 2 g (10/07/23 1020)   lactated ringers 100 mL/hr at 10/07/23 0500     LOS: 2 days    Time spent: 35 Minutes    Yessica Putnam A Ismahan Lippman, MD [facility]   If 7PM-7AM, please contact night-coverage [email or web address]  10/07/2023, 11:54 AM

## 2023-10-07 NOTE — Evaluation (Addendum)
 Physical Therapy Evaluation Patient Details Name: [name] MRN: [id number] DOB: [birth year] Today's Date: 10/07/2023  History of Present Illness  The pt is a 58 yo female presenting 6/1 with reports of 3 seizures at home. EEG showed  epileptogenicity arising from left temporal region with increased risk of seizure recurrence. MRI shows mildly increased enhancement and T2 hyperintensity in the  left inferior frontal gyrus. PMH includes: seizures, Bell's palsy, brain cancer s/p crani for L temporal mass resection in 2022, CVA with R-sided weakness and aphasia, HTN, HLD, hyperthyroidism, PVCs, OSA, TB.   Clinical Impression  Pt in bed upon arrival of PT, agreeable to evaluation at this time. Prior to admission the pt reports she was not using DME, and living with her daughter in a single story home. Pt reports no falls and that her daughter has been with her for all mobility and ADLs recently. The pt required min-modA to manage bed mobility and initial sit-stand transfers. The pt did improve with continued reps and progressed to more of minA level with gait and sit-stand by end of session. Pt does present with deficits in RUE and RLE strength, impaired coordination, and reports of impaired sensation in R foot. Pt also presents with impaired communication, difficulty word finding and at times noticeably frustrated with word finding issues through session. Pt reports her yes/no are accurate, but did answer incorrectly regarding her name and location when using yes/no. Pt will continue to benefit from skilled PT acutely to progress functional strength, power, stability, and endurance, and will benefit from continued intensive therapy >3hours/day until closer to her functional baseline of independence. Pt demos good motivation and family support to progress.     If plan is discharge home, recommend the following: A lot of help with walking and/or transfers;A lot of help with  bathing/dressing/bathroom;Assistance with cooking/housework;Assistance with feeding;Direct supervision/assist for medications management;Direct supervision/assist for financial management;Assist for transportation;Help with stairs or ramp for entrance;Supervision due to cognitive status   Can travel by private vehicle        Equipment Recommendations Rolling walker (2 wheels);Wheelchair (measurements PT);Wheelchair cushion (measurements PT)  Recommendations for Other Services       Functional Status Assessment Patient has had a recent decline in their functional status and demonstrates the ability to make significant improvements in function in a reasonable and predictable amount of time.     Precautions / Restrictions Precautions Precautions: Fall Recall of Precautions/Restrictions: Impaired Precaution/Restrictions Comments: seizure Restrictions Weight Bearing Restrictions Per Provider Order: No      Mobility  Bed Mobility Overal bed mobility: Needs Assistance Bed Mobility: Supine to Sit     Supine to sit: Min assist, HOB elevated, Used rails     General bed mobility comments: minA to manage LE, assist trunk to sitting, and scoot to EOB    Transfers Overall transfer level: Needs assistance Equipment used: Rolling walker (2 wheels) Transfers: Sit to/from Stand, Bed to chair/wheelchair/BSC Sit to Stand: Mod assist, Min assist   Step pivot transfers: Min assist       General transfer comment: initially modA to rise and manage pivotal steps, improved with continued reps in session to minA. cues for UE support, assist for RW management.    Ambulation/Gait Ambulation/Gait assistance: Min assist Gait Distance (Feet): 50 Feet Assistive device: Rolling walker (2 wheels) Gait Pattern/deviations: Step-through pattern, Decreased stride length, Decreased dorsiflexion - right, Shuffle, Trunk flexed Gait velocity: decreased Gait velocity interpretation: <1.31 ft/sec,  indicative of household ambulator   General Gait  Details: pt with slow, small steps, assist to manage RW and running into objects on R side. cues for posture and gait. after 50 ft, gait with significant slowing, encouraged pt to turn back to room and she then indicated she needed to sit immediately due to fatigue. chair pulled in hallway and pt reports tired, VSS     Balance Overall balance assessment: Needs assistance Sitting-balance support: No upper extremity supported, Feet supported Sitting balance-Leahy Scale: Fair     Standing balance support: Bilateral upper extremity supported, Reliant on assistive device for balance, During functional activity Standing balance-Leahy Scale: Poor Standing balance comment: dependent on BUE support and minA                             Pertinent Vitals/Pain Pain Assessment Pain Assessment: Faces Faces Pain Scale: Hurts a little bit Pain Location: pt reports not feeling well, denies pain, unable to state what does not feel well    Home Living Family/patient expects to be discharged to:: Private residence Living Arrangements: Children Available Help at Discharge: Family;Available 24 hours/day Type of Home: House Home Access: Level entry       Home Layout: One level Home Equipment: None Additional Comments: all per pt report and pt is unreliable historian. no family present. will need to confirm    Prior Function Prior Level of Function : Independent/Modified Independent             Mobility Comments: pt reports daughter present for all mobility, not using DME, no falls. does state she exercises 2-3 days/week ADLs Comments: pt reports daughter is present and supervises for safety with ADLs, assists with IADLs     Extremity/Trunk Assessment   Upper Extremity Assessment Upper Extremity Assessment: Defer to OT evaluation;RUE deficits/detail RUE Deficits / Details: impaired grip strength, slower to activate, impaired  coordination. no family present to determine baseline RUE Coordination: decreased fine motor    Lower Extremity Assessment Lower Extremity Assessment: Generalized weakness;RLE deficits/detail;LLE deficits/detail RLE Deficits / Details: grossly 3+/5 to ankle, 4-/5 to knee and 3+/5 at hip. reports pins and needles in foot, sensation normal at knee/thigh RLE Sensation: decreased light touch RLE Coordination: decreased fine motor LLE Deficits / Details: grossly 4-5 to MMT, reports sensation intact LLE Coordination: WNL    Cervical / Trunk Assessment Cervical / Trunk Assessment: Kyphotic  Communication   Communication Communication: Impaired Factors Affecting Communication: Difficulty expressing self    Cognition Arousal: Lethargic, Alert Behavior During Therapy: Flat affect   PT - Cognitive impairments: No family/caregiver present to determine baseline, Orientation, Difficult to assess, Problem solving, Safety/Judgement Difficult to assess due to: Impaired communication Orientation impairments: Person, Place (pt nodded "yes" when asked if she is at home, stated her name as Ivin Marrow then [place])                   PT - Cognition Comments: pt with expressive difficulties throughout session, noticeably frustrated with word-finding issues. able to follow simple commands but not aware of limits of activity Following commands: Impaired Following commands impaired: Follows one step commands with increased time     Cueing Cueing Techniques: Verbal cues, Gestural cues     General Comments General comments (skin integrity, edema, etc.): VSS on RA, no family present, pt with expressive difficulties    Exercises     Assessment/Plan    PT Assessment Patient needs continued PT services  PT Problem List Decreased strength;Decreased activity tolerance;Decreased  balance;Decreased mobility;Decreased coordination;Decreased cognition;Decreased safety awareness       PT Treatment  Interventions DME instruction;Gait training;Stair training;Functional mobility training;Therapeutic activities;Therapeutic exercise;Balance training;Patient/family education    PT Goals (Current goals can be found in the Care Plan section)  Acute Rehab PT Goals Patient Stated Goal: none stated PT Goal Formulation: With patient Time For Goal Achievement: 10/21/23 Potential to Achieve Goals: Good    Frequency Min 3X/week        AM-PAC PT "6 Clicks" Mobility  Outcome Measure Help needed turning from your back to your side while in a flat bed without using bedrails?: A Little Help needed moving from lying on your back to sitting on the side of a flat bed without using bedrails?: A Little Help needed moving to and from a bed to a chair (including a wheelchair)?: A Little Help needed standing up from a chair using your arms (e.g., wheelchair or bedside chair)?: A Little Help needed to walk in hospital room?: A Lot Help needed climbing 3-5 steps with a railing? : Total 6 Click Score: 15    End of Session Equipment Utilized During Treatment: Gait belt Activity Tolerance: Patient tolerated treatment well;Patient limited by fatigue Patient left: in chair;with call bell/phone within reach;with chair alarm set Nurse Communication: Mobility status PT Visit Diagnosis: Unsteadiness on feet (R26.81);Other abnormalities of gait and mobility (R26.89);Muscle weakness (generalized) (M62.81);Hemiplegia and hemiparesis Hemiplegia - Right/Left: Right    Time: 1610-9604 PT Time Calculation (min) (ACUTE ONLY): 38 min   Charges:   PT Evaluation $PT Eval Moderate Complexity: 1 Mod PT Treatments $Gait Training: 8-22 mins $Therapeutic Activity: 8-22 mins PT General Charges $$ ACUTE PT VISIT: 1 Visit         Barnabas Booth, PT, DPT   Acute Rehabilitation Department Office [phone] Secure Chat Communication Preferred  Lona Rist 10/07/2023, 1:07 PM

## 2023-10-07 NOTE — Evaluation (Addendum)
 Speech Language Pathology Evaluation Patient Details Name: [name] MRN: [id number] DOB: May 23, 1965 Today's Date: 10/07/2023 Time: 6962-9528 SLP Time Calculation (min) (ACUTE ONLY): 12 min  Problem List:  Patient Active Problem List   Diagnosis Date Noted   Carcinoma of head of pancreas ([name]) 10/05/2023   Seizure disorder ([name]) 10/05/2023   Generalized seizure disorder ([name]) 10/05/2023   Neutropenia ([name]) 10/18/2021   Genetic testing 08/24/2021   Family history of colon cancer 08/03/2021   Pancreatic mass 07/31/2021   S/P craniotomy 11/21/2020   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) 11/21/2020   Focal seizures ([name]) 11/15/2020   OSA (obstructive sleep apnea) 06/13/2017   Hypothyroid 06/26/2015   Brachial neuritis 03/29/2015   Rotator cuff tear 12/21/2014   Abdominal pain, right upper quadrant 03/14/2014   Leg cramps 07/15/2013   Acquired anal stenosis 06/18/2011   Chronic constipation 06/18/2011   PVC's (premature ventricular contractions) 05/23/2011   Headache, migraine, intractable 05/09/2011   Asthma exacerbation, allergic 07/31/2010   POSITIVE PPD 05/04/2008   Hyperlipidemia 02/05/2008   Essential hypertension 02/05/2008   Pulmonary nodules 02/05/2008   Chronic cough 02/05/2008   Past Medical History:  Past Medical History:  Diagnosis Date   Bell's palsy    left side of face- notied in smile and eyes, if very tired   Brain cancer ([name])    Complication of anesthesia    N/V   Essential hypertension    External hemorrhoids    History of blood transfusion    post op Hemorroids   History of pneumonia    Hyperlipidemia    Hyperthyroidism    hx 58 years old- oral meds   Iron deficiency anemia    Migraine headache    Pneumonia    x 2 - years ago   PONV (postoperative nausea and vomiting)    PPD positive    Pulmonary nodules    not seen on plain cxr, first detected 1/07 re ct 10/08 pos IPPD > 15 mm 12/09 FOB/lavage 04/20/08 neg afb  smear   PVC's (premature ventricular contractions)    Seizure ([name]) 11/15/2020   Sleep apnea    refused CPAP- mouth piece recommended   Tuberculosis    in her late 30's   Past Surgical History:  Past Surgical History:  Procedure Laterality Date   ABDOMINAL HYSTERECTOMY     APPLICATION OF CRANIAL NAVIGATION N/A 11/21/2020   Procedure: APPLICATION OF CRANIAL NAVIGATION;  Surgeon: Cannon Champion, MD;  Location: [facility] OR;  Service: Neurosurgery;  Laterality: N/A;   BREAST BIOPSY Right 2014   BREAST BIOPSY Left 10/29/2019   CHOLECYSTECTOMY  2014   COLONOSCOPY  05/04/2009   prolapsing external hemorrhoids   CRANIOTOMY Left 11/21/2020   Procedure: Left craniotomy for tumor resection;  Surgeon: Cannon Champion, MD;  Location: [facility] OR;  Service: Neurosurgery;  Laterality: Left;   ENDOMETRIAL ABLATION     HEMORRHOID SURGERY     x 3   TUBAL LIGATION     HPI:  Patient is a 58 y.o. female with PMH: Bell's palsy, brain cancer s/p left craniotomy in 2022, essential HTN, HLD, per MD aphasia since stroke in March, hyperthyroidism, iron deficiency anemia, , pulmonary nodules, migraine HA, OSA, seizure. She presented to the hospital on 10/05/23 after about 5-6 seizures at home previous date with each seizure lasting less than 5 minutes. She was transferred to [facility] for further management.   Assessment / Plan / Recommendation Clinical Impression  Per MD pt has a  history of aphasia from stroke in March 2025. No family present to determine current status compared to baseline. Pt presents with expressive > receptive aphasia and cognition impairments. She is able to name common objects wtih 80% accuracy however when expressing thoughts in sentences they are often filled with decreased semantics and paraphasias with difficulty expressing intended message. She followed one step commands with 100% accuracy and 2 step with 80% accuracy. When asked what she would say/request in various hypothetical situations she  was able to state without significant difficulty. Accuracy of orientation with choices was inconsistent. Her safety awareness is a concern as she appeared to be attempting to get out of chair when therapist entered the room. ST will follow while here and recommend  outpatient ST if possible.    SLP Assessment  SLP Recommendation/Assessment: Patient needs continued Speech Lanaguage Pathology Services SLP Visit Diagnosis: Aphasia (R47.01);Cognitive communication deficit (R41.841)    Recommendations for follow up therapy are one component of a multi-disciplinary discharge planning process, led by the attending physician.  Recommendations may be updated based on patient status, additional functional criteria and insurance authorization.    Follow Up Recommendations  Outpatient SLP (TBD)    Assistance Recommended at Discharge  Frequent or constant Supervision/Assistance  Functional Status Assessment Patient has had a recent decline in their functional status and demonstrates the ability to make significant improvements in function in a reasonable and predictable amount of time.  Frequency and Duration min 2x/week  2 weeks      SLP Evaluation Cognition  Overall Cognitive Status: No family/caregiver present to determine baseline cognitive functioning Arousal/Alertness: Awake/alert Orientation Level: Oriented to person;Disoriented to place;Disoriented to situation;Disoriented to time (correct for hospital with choice) Month:  (incorrect with choice) Attention: Sustained Sustained Attention: Appears intact Memory:  (TBD) Awareness: Impaired Awareness Impairment: Anticipatory impairment (appeared as if she was trying to get up from chair when SLP entered room) Problem Solving: Impaired Problem Solving Impairment: Functional basic Safety/Judgment: Impaired       Comprehension  Auditory Comprehension Overall Auditory Comprehension: Impaired Yes/No Questions: Not tested Commands:  Impaired Two Step Basic Commands: 50-74% accurate (75%) Conversation: Simple Visual Recognition/Discrimination Discrimination: Not tested Reading Comprehension Reading Status:  (TBA)    Expression Expression Primary Mode of Expression: Verbal Verbal Expression Overall Verbal Expression: Impaired at baseline (uncertain how different from baseline) Initiation: No impairment Level of Generative/Spontaneous Verbalization: Sentence Repetition:  (NT) Naming: Impairment Confrontation:  (80%) Pragmatics: No impairment Written Expression Written Expression:  (TBA)   Oral / Motor  Oral Motor/Sensory Function Overall Oral Motor/Sensory Function: Mild impairment Facial ROM: Reduced left Facial Symmetry: Abnormal symmetry left Facial Strength: Reduced left Motor Speech Overall Motor Speech: Appears within functional limits for tasks assessed Respiration: Within functional limits Phonation: Normal Resonance: Within functional limits Articulation: Within functional limitis Intelligibility: Intelligible Motor Planning: Witnin functional limits Motor Speech Errors: Not applicable            Naomia Bachelor 10/07/2023, 10:41 AM

## 2023-10-07 NOTE — Progress Notes (Signed)
 OT Cancellation Note  Patient Details Name: [name] MRN: [id number] DOB: March 28, 1966   Cancelled Treatment:    Reason Eval/Treat Not Completed: Other (comment): Pt just started lunch. Asked OT to return later.   Asher Blade 10/07/2023, 1:02 PM

## 2023-10-08 ENCOUNTER — Inpatient Hospital Stay (HOSPITAL_COMMUNITY)

## 2023-10-08 DIAGNOSIS — G40309 Generalized idiopathic epilepsy and epileptic syndromes, not intractable, without status epilepticus: Secondary | ICD-10-CM | POA: Diagnosis not present

## 2023-10-08 DIAGNOSIS — M79661 Pain in right lower leg: Secondary | ICD-10-CM

## 2023-10-08 LAB — BASIC METABOLIC PANEL WITH GFR
Anion gap: 13 (ref 5–15)
BUN: 7 mg/dL (ref 6–20)
CO2: 23 mmol/L (ref 22–32)
Calcium: 9.6 mg/dL (ref 8.9–10.3)
Chloride: 101 mmol/L (ref 98–111)
Creatinine, Ser: 0.88 mg/dL (ref 0.44–1.00)
GFR, Estimated: >60 mL/min (ref >60)
Glucose, Bld: 72 mg/dL (ref 70–99)
Potassium: 3.6 mmol/L (ref 3.5–5.1)
Sodium: 137 mmol/L (ref 135–145)

## 2023-10-08 LAB — MAGNESIUM: Magnesium: 2 mg/dL (ref 1.7–2.4)

## 2023-10-08 MED ORDER — ONDANSETRON HCL 4 MG/2ML IJ SOLN: 4.0000 mg | Freq: Four times a day (QID) | INTRAMUSCULAR | Status: DC | PRN

## 2023-10-08 MED ORDER — TRAZODONE HCL 50 MG PO TABS: 50.0000 mg | ORAL_TABLET | Freq: Every evening | ORAL | Status: DC | PRN

## 2023-10-08 MED ORDER — HYDRALAZINE HCL 20 MG/ML IJ SOLN: 10.0000 mg | INTRAMUSCULAR | Status: DC | PRN

## 2023-10-08 MED ORDER — GUAIFENESIN 100 MG/5ML PO LIQD: 5.0000 mL | ORAL | Status: DC | PRN

## 2023-10-08 MED ORDER — IPRATROPIUM-ALBUTEROL 0.5-2.5 (3) MG/3ML IN SOLN: 3.0000 mL | RESPIRATORY_TRACT | Status: DC | PRN

## 2023-10-08 MED ORDER — METOPROLOL TARTRATE 5 MG/5ML IV SOLN: 5.0000 mg | INTRAVENOUS | Status: DC | PRN

## 2023-10-08 NOTE — Progress Notes (Signed)
 PROGRESS NOTE    [name]  VHQ:[id number] DOB: June 02, 1965 DOA: 10/05/2023 PCP: Alto Atta, NP    Brief Narrative:   58 year old with past medical history significant for seizures, Bell's palsy, brain cancer, OSA, hypertension, hyperlipidemia, hypothyroidism, migraine headache presented with recurrent seizures episodes, witnessed by family member.   Patient was also found to have pneumonia.  Started on IV antibiotics.  No further seizures were noticed in the hospital but Keppra  dose was adjusted.  EEG showed some sharp waves in the left temporal region which could be artifact.  Patient was seen by neurology. 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  EEG here showed sharp waves in the left temporal region which could be artifact.  Seen by neurology.  Recommended to increase Keppra  to 1750 mg twice daily, continue Vimpat   Left lower lobe pneumonia Started on Rocephin and azithromycin .   Vitamin D  deficiency Vitamin D  supplements   Hypokalemia: Repletion   Low normal B12: Supplements   Hypothyroidism  on Synthroid .  Not significantly elevated free T4.     History of GBM: Glioblastoma with Isocitrate  dehydrogenase gene wild-type. Status post left temporal craniotomy resection by Dr. Dorothy Gates 11/2020.  Follows outpatient Dr. Mark Sil.  Avastin  on hold.  Continue hydrocortisone  10 mg daily. MRI was reviewed by Dr. Mark Sil   History of CVA with baseline right-sided weakness: Resume aspirin .    Hypertension: Norvasc , IV as needed   Estimated body mass index is 27.72 kg/m as calculated from the  following:   Height as of 10/02/23: 5\' 7"  (1.702 m).   Weight as of 10/02/23: 80.3 kg.  PT/OT-CIR     DVT prophylaxis: Heparin  Code Status: Full Code Family Communication: Daughter 6/03 Disposition Plan:  Awaiting CIR placement  Subjective: Seen at bedside, no complaints.   Examination:  General exam: Appears calm and comfortable  Respiratory system: Clear to auscultation. Respiratory effort normal. Cardiovascular system: S1 & S2 heard, RRR. No JVD, murmurs, rubs, gallops or clicks. No pedal edema. Gastrointestinal system: Abdomen is nondistended, soft and nontender. No organomegaly or masses felt. Normal bowel sounds heard. Central nervous system: Alert and oriented. No focal neurological deficits. Extremities: Symmetric 5 x 5 power. Skin: No rashes, lesions or ulcers Psychiatry: Judgement and insight appear normal. Mood & affect appropriate.                Diet Orders (From admission, onward)     Start     Ordered   10/07/23 0953  DIET DYS 3 Room service appropriate? No; Fluid consistency: Thin  Diet effective now       Question Answer Comment  Room service appropriate? No   Fluid consistency: Thin      10/07/23 0953            Objective: Vitals:   10/07/23 1622 10/07/23 2040 10/08/23 0400 10/08/23 0826  BP: 125/80 123/71 122/76 124/77  Pulse: 79 88 86 87  Resp: 14 19 18 20   Temp: 98.9 F (37.2 C) 98.9 F (37.2 C) 98.7 F (37.1 C) 98.8 F (37.1 C)  TempSrc: Oral Oral Oral Oral  SpO2: 96% 98% 95% 97%  Intake/Output Summary (Last 24 hours) at 10/08/2023 1135 Last data filed at 10/07/2023 1400 Gross per 24 hour  Intake 100 ml  Output --  Net 100 ml   There were no vitals filed for this visit.  Scheduled Meds:  amLODipine   5 mg Oral Daily   aspirin  EC  325 mg Oral Daily   azithromycin   500 mg Oral Daily   docusate sodium   100 mg Oral BID   heparin   5,000 Units Subcutaneous Q8H   hydrocortisone   10 mg Oral Daily   lacosamide   200 mg Oral  BID   levETIRAcetam   1,750 mg Oral BID   levothyroxine   100 mcg Oral Q0600   vitamin B-12  500 mcg Oral Daily   Vitamin D  (Ergocalciferol )  50,000 Units Oral Q7 days   Continuous Infusions:  cefTRIAXone (ROCEPHIN)  IV 2 g (10/08/23 1044)    Nutritional status     There is no height or weight on file to calculate BMI.  Data Reviewed:   CBC: Recent Labs  Lab 10/05/23 1846 10/06/23 0527 10/07/23 0802  WBC 6.5 8.0 7.5  NEUTROABS 4.9  --   --   HGB 12.7 12.3 11.3*  HCT 38.6 38.3 35.1*  MCV 94.8 94.6 94.6  PLT 152 150 145*   Basic Metabolic Panel: Recent Labs  Lab 10/05/23 1846 10/06/23 0527 10/07/23 0801 10/07/23 0802 10/08/23 0436  NA  --  137  --  139 137  K  --  3.2*  --  3.4* 3.6  CL  --  108  --  102 101  CO2  --  22  --  22 23  GLUCOSE  --  107*  --  79 72  BUN  --  7  --  8 7  CREATININE  --  0.98  --  0.89 0.88  CALCIUM   --  9.0  --  9.5 9.6  MG 2.0  --  2.0  --  2.0  PHOS 3.1  --   --   --   --    GFR: Estimated Creatinine Clearance: 78.8 mL/min (by C-G formula based on SCr of 0.88 mg/dL). Liver Function Tests: Recent Labs  Lab 10/06/23 0527  AST 26  ALT 34  ALKPHOS 73  BILITOT 3.7*  PROT 6.5  ALBUMIN  3.2*   No results for input(s): "LIPASE", "AMYLASE" in the last 168 hours. No results for input(s): "AMMONIA" in the last 168 hours. Coagulation Profile: No results for input(s): "INR", "PROTIME" in the last 168 hours. Cardiac Enzymes: Recent Labs  Lab 10/05/23 1846  CKTOTAL 118   BNP (last 3 results) No results for input(s): "PROBNP" in the last 8760 hours. HbA1C: No results for input(s): "HGBA1C" in the last 72 hours. CBG: No results for input(s): "GLUCAP" in the last 168 hours. Lipid Profile: No results for input(s): "CHOL", "HDL", "LDLCALC", "TRIG", "CHOLHDL", "LDLDIRECT" in the last 72 hours. Thyroid  Function Tests: Recent Labs    10/05/23 1846  TSH 0.430  FREET4 1.19*   Anemia Panel: No results for input(s): "VITAMINB12",  "FOLATE", "FERRITIN", "TIBC", "IRON", "RETICCTPCT" in the last 72 hours. Sepsis Labs: No results for input(s): "PROCALCITON", "LATICACIDVEN" in the last 168 hours.  No results found for this or any previous visit (from the past 240 hours).       Radiology Studies: VAS US  LOWER EXTREMITY VENOUS (DVT) Result Date: 10/08/2023  Lower Venous DVT Study Patient Name:  MICCA MATURA  Date of Exam:   10/08/2023 Medical Rec #:  [id number]         Accession #:    [id number] Date of Birth: 1966-03-07         Patient Gender: F Patient Age:   18 years Exam Location:  [facility] Procedure:      VAS US  LOWER EXTREMITY VENOUS (DVT) Referring Phys: Clifford Dam REGALADO --------------------------------------------------------------------------------  Indications: Pain.  Risk Factors: None identified. Comparison Study: No prior studies. Performing Technologist: Lerry Ransom RVT  Examination Guidelines: A complete evaluation includes B-mode imaging, spectral Doppler, color Doppler, and power Doppler as needed of all accessible portions of each vessel. Bilateral testing is considered an integral part of a complete examination. Limited examinations for reoccurring indications may be performed as noted. The reflux portion of the exam is performed with the patient in reverse Trendelenburg.  +---------+---------------+---------+-----------+----------+--------------+ RIGHT    CompressibilityPhasicitySpontaneityPropertiesThrombus Aging +---------+---------------+---------+-----------+----------+--------------+ CFV      Full           Yes      Yes                                 +---------+---------------+---------+-----------+----------+--------------+ SFJ      Full                                                        +---------+---------------+---------+-----------+----------+--------------+ FV Prox  Full                                                         +---------+---------------+---------+-----------+----------+--------------+ FV Mid   Full                                                        +---------+---------------+---------+-----------+----------+--------------+ FV DistalFull                                                        +---------+---------------+---------+-----------+----------+--------------+ PFV      Full                                                        +---------+---------------+---------+-----------+----------+--------------+ POP      Full           Yes      Yes                                 +---------+---------------+---------+-----------+----------+--------------+ PTV      Full                                                        +---------+---------------+---------+-----------+----------+--------------+  PERO     Full                                                        +---------+---------------+---------+-----------+----------+--------------+   +---------+---------------+---------+-----------+----------+--------------+ LEFT     CompressibilityPhasicitySpontaneityPropertiesThrombus Aging +---------+---------------+---------+-----------+----------+--------------+ CFV      Full           Yes      Yes                                 +---------+---------------+---------+-----------+----------+--------------+ SFJ      Full                                                        +---------+---------------+---------+-----------+----------+--------------+ FV Prox  Full                                                        +---------+---------------+---------+-----------+----------+--------------+ FV Mid   Full                                                        +---------+---------------+---------+-----------+----------+--------------+ FV DistalFull                                                         +---------+---------------+---------+-----------+----------+--------------+ PFV      Full                                                        +---------+---------------+---------+-----------+----------+--------------+ POP      Full           Yes      Yes                                 +---------+---------------+---------+-----------+----------+--------------+ PTV      Full                                                        +---------+---------------+---------+-----------+----------+--------------+ PERO     Full                                                        +---------+---------------+---------+-----------+----------+--------------+  Summary: RIGHT: - There is no evidence of deep vein thrombosis in the lower extremity.  - No cystic structure found in the popliteal fossa.  LEFT: - There is no evidence of deep vein thrombosis in the lower extremity.  - No cystic structure found in the popliteal fossa.  *See table(s) above for measurements and observations.    Preliminary    MR BRAIN W WO CONTRAST Result Date: 10/07/2023 CLINICAL DATA:  Brain/CNS neoplasm, assess treatment response 58 year old female with glioblastoma, IDH wild-type. Left temporal craniotomy, IMRT in 2022. Chemotherapy continues, repeat radiation therapy in February this year EXAM: MRI HEAD WITHOUT AND WITH CONTRAST TECHNIQUE: Multiplanar, multiecho pulse sequences of the brain and surrounding structures were obtained without and with intravenous contrast. CONTRAST:  8mL GADAVIST  GADOBUTROL  1 MMOL/ML IV SOLN COMPARISON:  MRI August 14, 2023. FINDINGS: Brain: Continued fading of restricted diffusion in the left corona radiata with similar restricted diffusion in the left periatrial region. There is mildly increased patchy and curvilinear enhancement anteriorly in the left inferior frontal gyrus with similar left periatrial/corona radiata patchy enhancement. No substantial change in areas of intrinsic T1  hyperintensity in these regions. Question mildly progressed T2/FLAIR hyperintensity also in the inferior left frontal gyrus. Additional T2/FLAIR hyperintensities in the left cerebral hemisphere are not substantially changed. No significant mass effect or midline shift. No evidence of acute hemorrhage or hydrocephalus. Similar anterior left temporal lobe resection site without abnormal enhancement in this region. Vascular: Major arterial flow voids are maintained skull base. Skull and upper cervical spine: Stable left-sided craniotomy. Otherwise, unremarkable bone marrow. Sinuses/Orbits: Clear sinuses.  No acute findings. Other: No mastoid effusions. IMPRESSION: Interval mildly increased enhancement and T2 hyperintensity in the left inferior frontal gyrus. Findings could represent continued evolution of treatment change or slow progression of disease. Recommend continued imaging follow-up. Electronically Signed   By: Stevenson Elbe M.D.   On: 10/07/2023 03:06           LOS: 3 days   Time spent= 35 mins    Maggie Schooner, MD [facility]  If 7PM-7AM, please contact night-coverage  10/08/2023, 11:35 AM

## 2023-10-08 NOTE — TOC Progression Note (Signed)
 Transition of Care [facility]) - Progression Note    Patient Details  Name: [name] MRN: [id number] Date of Birth: 1965-09-02  Transition [facility] CM/SW Contact  Jonathan Neighbor, RN Phone Number: 10/08/2023, 3:04 PM  Clinical Narrative:     Recommendations are for CIR. Daughter and brother prefer she d/c home with outpatient therapy. They have plans to celebrate her birthday.  Daughter assists with ADLs. When daughter is working pt is with her sister or daughters God parents.  BSC ordered through [employer] and will be delivered to the room. Family will transport home when medically ready.   Expected Discharge Plan: OP Rehab Barriers to Discharge: Continued Medical Work up  Expected Discharge Plan and Services   Discharge Planning Services: CM Consult   Living arrangements for the past 2 months: Single Family Home                                       Social Determinants of Health (SDOH) Interventions SDOH Screenings   Food Insecurity: No Food Insecurity (10/05/2023)  Housing: Unknown (10/05/2023)  Transportation Needs: No Transportation Needs (10/05/2023)  Utilities: Not At Risk (10/05/2023)  Depression (PHQ2-9): Low Risk  (06/03/2023)  Financial Resource Strain: Medium Risk (06/13/2021)  Physical Activity: Unknown (04/23/2021)  Social Connections: Unknown (09/16/2021)   Received from [facility]  Stress: No Stress Concern Present (04/23/2021)  Tobacco Use: Low Risk  (10/04/2023)   Received from [facility]    Readmission Risk Interventions     No data to display

## 2023-10-08 NOTE — Progress Notes (Signed)
 Physical Therapy Treatment Patient Details Name: [name] MRN: [id number] DOB: Dec 02, 1965 Today's Date: 10/08/2023   History of Present Illness The pt is a 58 yo female presenting 6/1 with reports of 3 seizures at home. EEG showed  epileptogenicity arising from left temporal region with increased risk of seizure recurrence. MRI shows mildly increased enhancement and T2 hyperintensity in the  left inferior frontal gyrus. PMH includes: seizures, Bell's palsy, brain cancer s/p crani for L temporal mass resection in 2022, CVA with R-sided weakness and aphasia, HTN, HLD, hyperthyroidism, PVCs, OSA, TB.    PT Comments  Pt with fair tolerance to treatment today. Co-treat with OT. Pt today was able to progress ambulation distance however still required Min A and demonstrated a heavy L drift with RW once fatigued. Pt also noted with continued difficulty navigating obstacles with very poor problem solving and slow processing requiring max cues for safety. No change in DC/DME recs at this time. PT will continue to follow.     If plan is discharge home, recommend the following: A lot of help with walking and/or transfers;A lot of help with bathing/dressing/bathroom;Assistance with cooking/housework;Assistance with feeding;Direct supervision/assist for medications management;Direct supervision/assist for financial management;Assist for transportation;Help with stairs or ramp for entrance;Supervision due to cognitive status   Can travel by private vehicle        Equipment Recommendations  Rolling walker (2 wheels);Wheelchair (measurements PT);Wheelchair cushion (measurements PT)    Recommendations for Other Services       Precautions / Restrictions Precautions Precautions: Fall Recall of Precautions/Restrictions: Impaired Precaution/Restrictions Comments: seizure Restrictions Weight Bearing Restrictions Per Provider Order: No     Mobility  Bed Mobility Overal bed mobility: Needs  Assistance Bed Mobility: Sit to Supine     Supine to sit: Min assist, HOB elevated, Used rails     General bed mobility comments: minA to manage LE, assist trunk to sitting, and scoot to EOB    Transfers Overall transfer level: Needs assistance Equipment used: Rolling walker (2 wheels) Transfers: Sit to/from Stand, Bed to chair/wheelchair/BSC Sit to Stand: Mod assist, Min assist   Step pivot transfers: Min assist       General transfer comment: initially mod A, cues for UE support, assist for RW management.    Ambulation/Gait Ambulation/Gait assistance: Min assist Gait Distance (Feet): 100 Feet Assistive device: Rolling walker (2 wheels) Gait Pattern/deviations: Step-through pattern, Decreased stride length, Decreased dorsiflexion - right, Shuffle, Trunk flexed, Drifts right/left Gait velocity: decreased     General Gait Details: pt with slow, small steps, assist to manage RW and running into objects on L side. cues for posture and gait. after 75 ft, gait with significant slowing, and heavy drift to the L side with RW. pt reports tired, VSS   Stairs             Wheelchair Mobility     Tilt Bed    Modified Rankin (Stroke Patients Only)       Balance Overall balance assessment: Needs assistance Sitting-balance support: No upper extremity supported, Feet supported Sitting balance-Leahy Scale: Fair     Standing balance support: Bilateral upper extremity supported, Reliant on assistive device for balance, During functional activity Standing balance-Leahy Scale: Poor                              Communication Communication Communication: Impaired Factors Affecting Communication: Difficulty expressing self;Reduced clarity of speech  Cognition Arousal: Alert Behavior During Therapy:  Flat affect                           PT - Cognition Comments: pt with expressive difficulties throughout session, noticeably frustrated with  word-finding issues. able to follow simple commands but not aware of limits of activity Following commands: Impaired Following commands impaired: Follows one step commands with increased time, Follows multi-step commands inconsistently    Cueing Cueing Techniques: Verbal cues, Gestural cues, Tactile cues  Exercises      General Comments General comments (skin integrity, edema, etc.): VSS      Pertinent Vitals/Pain Pain Assessment Pain Assessment: No/denies pain    Home Living                          Prior Function            PT Goals (current goals can now be found in the care plan section) Progress towards PT goals: Progressing toward goals    Frequency    Min 3X/week      PT Plan      Co-evaluation              AM-PAC PT "6 Clicks" Mobility   Outcome Measure  Help needed turning from your back to your side while in a flat bed without using bedrails?: A Little Help needed moving from lying on your back to sitting on the side of a flat bed without using bedrails?: A Little Help needed moving to and from a bed to a chair (including a wheelchair)?: A Little Help needed standing up from a chair using your arms (e.g., wheelchair or bedside chair)?: A Little Help needed to walk in hospital room?: A Lot Help needed climbing 3-5 steps with a railing? : Total 6 Click Score: 15    End of Session Equipment Utilized During Treatment: Gait belt Activity Tolerance: Patient tolerated treatment well;Patient limited by fatigue Patient left: in chair;with call bell/phone within reach;with chair alarm set Nurse Communication: Mobility status PT Visit Diagnosis: Unsteadiness on feet (R26.81);Other abnormalities of gait and mobility (R26.89);Muscle weakness (generalized) (M62.81);Hemiplegia and hemiparesis Hemiplegia - Right/Left: Right     Time: 0981-1914 PT Time Calculation (min) (ACUTE ONLY): 21 min  Charges:    $Gait Training: 8-22 mins PT General  Charges $$ ACUTE PT VISIT: 1 Visit                     Rodgers Clack, PT, DPT Acute Rehab Services [phone]    Sheika Coutts 10/08/2023, 4:03 PM

## 2023-10-08 NOTE — Progress Notes (Signed)
 Occupational Therapy Treatment Patient Details Name: [name] MRN: [id number] DOB: [birth year] Today's Date: 10/08/2023   History of present illness The pt is a 58 yo female presenting 6/1 with reports of 3 seizures at home. EEG showed  epileptogenicity arising from left temporal region with increased risk of seizure recurrence. MRI shows mildly increased enhancement and T2 hyperintensity in the  left inferior frontal gyrus. PMH includes: seizures, Bell's palsy, brain cancer s/p crani for L temporal mass resection in 2022, CVA with R-sided weakness and aphasia, HTN, HLD, hyperthyroidism, PVCs, OSA, TB.   OT comments  Pt making progress with functional goals. Pt in bed upon arrival and agreeable to OOB activity. Pt required min A to sit EOB, mod A to don socks, sit - stand mod A min A with functional mobility with RW to manage and had difficulty problem solving with RW to enter bathroom. Min A to transfer to toilet and min A with anterior hygiene. OT will continue to follow acutely to maximize level of function and safety      If plan is discharge home, recommend the following:  Help with stairs or ramp for entrance;Assist for transportation;Assistance with cooking/housework;Supervision due to cognitive status;Direct supervision/assist for medications management;Direct supervision/assist for financial management;A lot of help with bathing/dressing/bathroom;A lot of help with walking and/or transfers   Equipment Recommendations  Other (comment) (defer)    Recommendations for Other Services      Precautions / Restrictions Precautions Precautions: Fall Recall of Precautions/Restrictions: Impaired Precaution/Restrictions Comments: seizure Restrictions Weight Bearing Restrictions Per Provider Order: No       Mobility Bed Mobility Overal bed mobility: Needs Assistance Bed Mobility: Sit to Supine     Supine to sit: Min assist, HOB elevated, Used rails     General bed mobility  comments: minA to manage LE, assist trunk to sitting, and scoot to EOB    Transfers Overall transfer level: Needs assistance Equipment used: Rolling walker (2 wheels) Transfers: Sit to/from Stand, Bed to chair/wheelchair/BSC Sit to Stand: Mod assist, Min assist     Step pivot transfers: Min assist     General transfer comment: initially mod A, cues for UE support, assist for RW management.     Balance Overall balance assessment: Needs assistance Sitting-balance support: No upper extremity supported, Feet supported Sitting balance-Leahy Scale: Fair     Standing balance support: Bilateral upper extremity supported, Reliant on assistive device for balance, During functional activity Standing balance-Leahy Scale: Poor                             ADL either performed or assessed with clinical judgement   ADL Overall ADL's : Needs assistance/impaired     Grooming: Wash/dry hands;Wash/dry face;Contact guard assist;Standing;Cueing for safety;Cueing for sequencing           Upper Body Dressing : Minimal assistance;Sitting   Lower Body Dressing: Sitting/lateral leans;Moderate assistance;Cueing for sequencing;Cueing for compensatory techniques Lower Body Dressing Details (indicate cue type and reason): donning socks seated EOB Toilet Transfer: Minimal assistance;Rolling walker (2 wheels);Cueing for sequencing;Cueing for safety Toilet Transfer Details (indicate cue type and reason): difficulty problem solving with RW to enter bathroom Toileting- Clothing Manipulation and Hygiene: Minimal assistance;Sit to/from stand;Cueing for safety       Functional mobility during ADLs: Minimal assistance;Moderate assistance;Rolling walker (2 wheels);Cueing for sequencing;Cueing for safety      Extremity/Trunk Assessment Upper Extremity Assessment Upper Extremity Assessment: Generalized weakness;RUE deficits/detail;LUE deficits/detail RUE Deficits /  Details: Shoulder AROM limited  to ~90. PROM tolerated to ~110, then pt with facial grimace. MMT: Shoulder, tricep: 2/5, bicep 2+/5, wrist 2/5, finger extension 3-/5, grip 3-/5. Slower to activate, impaired coordination. Per family, not at baseline. RUE Sensation: decreased light touch RUE Coordination: decreased fine motor;decreased gross motor LUE Deficits / Details: AROM: WFL. Able to oppose but with increased time and cues needed for accuracy. MMT: grossly 4-/5 LUE Coordination: decreased fine motor   Lower Extremity Assessment Lower Extremity Assessment: Defer to PT evaluation        Vision Ability to See in Adequate Light: 1 Impaired Patient Visual Report: Peripheral vision impairment     Perception     Praxis     Communication Communication Communication: Impaired Factors Affecting Communication: Difficulty expressing self;Reduced clarity of speech   Cognition Arousal: Alert Behavior During Therapy: Flat affect   Difficult to assess due to: Impaired communication           OT - Cognition Comments: Pt with expressive difficulties but seems to follow instructions well                 Following commands: Impaired Following commands impaired: Follows one step commands with increased time, Follows multi-step commands inconsistently      Cueing   Cueing Techniques: Verbal cues, Gestural cues, Tactile cues  Exercises      Shoulder Instructions       General Comments      Pertinent Vitals/ Pain          Home Living                                          Prior Functioning/Environment              Frequency  Min 3X/week        Progress Toward Goals  OT Goals(current goals can now be found in the care plan section)  Progress towards OT goals: Progressing toward goals     Plan      Co-evaluation                 AM-PAC OT "6 Clicks" Daily Activity     Outcome Measure   Help from another person eating meals?: A Little Help from another  person taking care of personal grooming?: A Little Help from another person toileting, which includes using toliet, bedpan, or urinal?: A Little Help from another person bathing (including washing, rinsing, drying)?: A Lot Help from another person to put on and taking off regular upper body clothing?: A Little Help from another person to put on and taking off regular lower body clothing?: A Lot 6 Click Score: 16    End of Session Equipment Utilized During Treatment: Gait belt;Rolling walker (2 wheels)  OT Visit Diagnosis: Unsteadiness on feet (R26.81);Hemiplegia and hemiparesis;Muscle weakness (generalized) (M62.81);Cognitive communication deficit (R41.841) Symptoms and signs involving cognitive functions: Other cerebrovascular disease Hemiplegia - Right/Left: Right Hemiplegia - caused by: Other cerebrovascular disease   Activity Tolerance Patient tolerated treatment well   Patient Left with call bell/phone within reach;with family/visitor present;in chair;with chair alarm set   Nurse Communication Mobility status        Time: 1610-9604 OT Time Calculation (min): 32 min  Charges: OT General Charges $OT Visit: 1 Visit OT Treatments $Self Care/Home Management : 8-22 mins    Alfred Ann 10/08/2023, 3:50 PM

## 2023-10-08 NOTE — Progress Notes (Signed)
 Bilateral lower extremity venous duplex has been completed. Preliminary results can be found in CV Proc through chart review.   10/08/23 9:30 AM Birda Buffy RVT

## 2023-10-08 NOTE — Hospital Course (Addendum)
 Brief Narrative:   58 year old with past medical history significant for seizures, Bell's palsy, brain cancer, OSA, hypertension, hyperlipidemia, hypothyroidism, migraine headache presented with recurrent seizures episodes, witnessed by family member.   Patient was also found to have pneumonia.  Started on IV antibiotics.  No further seizures were noticed in the hospital but Keppra  dose was adjusted.  EEG showed some sharp waves in the left temporal region which could be artifact.  Patient was seen by neurology. 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  EEG here showed sharp waves in the left temporal region which could be artifact.  Seen by neurology.  Recommended to increase Keppra  to 1750 mg twice daily, continue Vimpat   Left lower lobe pneumonia Started on Rocephin and azithromycin .   Vitamin D  deficiency Vitamin D  supplements   Hypokalemia: Repletion   Low normal B12: Supplements   Hypothyroidism  on Synthroid .  Not significantly elevated free T4.     History of GBM: Glioblastoma with Isocitrate  dehydrogenase gene wild-type. Status post left temporal craniotomy resection by Dr. Dorothy Gates 11/2020.  Follows outpatient Dr. Mark Sil.  Avastin  on hold.  Continue hydrocortisone  10 mg daily. MRI was reviewed by Dr. Mark Sil   History of CVA with baseline right-sided weakness: Resume aspirin .    Hypertension: Norvasc , IV as needed   Estimated body mass index is 27.72 kg/m as calculated from the following:   Height as of 10/02/23: 5\' 7"  (1.702 m).   Weight as of 10/02/23: 80.3 kg.  PT/OT-CIR     DVT  prophylaxis: Heparin  Code Status: Full Code Family Communication: Daughter 6/03 Disposition Plan:  Awaiting CIR placement  Subjective: Seen at bedside, no complaints.   Examination:  General exam: Appears calm and comfortable  Respiratory system: Clear to auscultation. Respiratory effort normal. Cardiovascular system: S1 & S2 heard, RRR. No JVD, murmurs, rubs, gallops or clicks. No pedal edema. Gastrointestinal system: Abdomen is nondistended, soft and nontender. No organomegaly or masses felt. Normal bowel sounds heard. Central nervous system: Alert and oriented. No focal neurological deficits. Extremities: Symmetric 5 x 5 power. Skin: No rashes, lesions or ulcers Psychiatry: Judgement and insight appear normal. Mood & affect appropriate.

## 2023-10-08 NOTE — Progress Notes (Signed)
 Inpatient Rehab Coordinator Note:  I met with patient at bedside to discuss CIR recommendations and goals/expectations of CIR stay.  We reviewed 3 hrs/day of therapy, physician follow up, and average length of stay 2 weeks (dependent upon progress) with goals of supervision.  She demos some verbal apraxia, but ultimately states she will do whatever is recommended.  Discussed with TOC who have spoken to family and they prefer d/c directly home when medically cleared and f/u with OP therapy.  CIR will sign off at this time.   Loye Rumble, PT, DPT Admissions Coordinator [phone] 10/08/23  3:25 PM

## 2023-10-08 NOTE — Progress Notes (Signed)
    Durable Medical Equipment  (From admission, onward)           Start     Ordered   10/08/23 1457  For home use only DME Bedside commode  Once       Question:  Patient needs a bedside commode to treat with the following condition  Answer:  Weakness   10/08/23 1456             BSC: The patient is confined to one level of the home environment and there is no toilet on the that level of the home.

## 2023-10-09 ENCOUNTER — Ambulatory Visit (HOSPITAL_COMMUNITY): Admission: RE | Admit: 2023-10-09 | Source: Ambulatory Visit

## 2023-10-09 ENCOUNTER — Telehealth: Payer: Self-pay

## 2023-10-09 DIAGNOSIS — G40309 Generalized idiopathic epilepsy and epileptic syndromes, not intractable, without status epilepticus: Secondary | ICD-10-CM | POA: Diagnosis not present

## 2023-10-09 LAB — CBC
HCT: 36.8 % (ref 36.0–46.0)
Hemoglobin: 12 g/dL (ref 12.0–15.0)
MCH: 30.4 pg (ref 26.0–34.0)
MCHC: 32.6 g/dL (ref 30.0–36.0)
MCV: 93.2 fL (ref 80.0–100.0)
Platelets: 186 10*3/uL (ref 150–400)
RBC: 3.95 MIL/uL (ref 3.87–5.11)
RDW: 13.4 % (ref 11.5–15.5)
WBC: 3.8 10*3/uL — ABNORMAL LOW (ref 4.0–10.5)
nRBC: 0 % (ref 0.0–0.2)

## 2023-10-09 LAB — BASIC METABOLIC PANEL WITH GFR
Anion gap: 11 (ref 5–15)
BUN: 8 mg/dL (ref 6–20)
CO2: 22 mmol/L (ref 22–32)
Calcium: 9.5 mg/dL (ref 8.9–10.3)
Chloride: 103 mmol/L (ref 98–111)
Creatinine, Ser: 0.83 mg/dL (ref 0.44–1.00)
GFR, Estimated: >60 mL/min (ref >60)
Glucose, Bld: 80 mg/dL (ref 70–99)
Potassium: 3.3 mmol/L — ABNORMAL LOW (ref 3.5–5.1)
Sodium: 136 mmol/L (ref 135–145)

## 2023-10-09 LAB — MAGNESIUM: Magnesium: 2.1 mg/dL (ref 1.7–2.4)

## 2023-10-09 LAB — PHOSPHORUS: Phosphorus: 4.2 mg/dL (ref 2.5–4.6)

## 2023-10-09 MED ORDER — CYANOCOBALAMIN 500 MCG PO TABS: 500.0000 ug | ORAL_TABLET | Freq: Every day | ORAL | 0 refills | Status: AC

## 2023-10-09 MED ORDER — VITAMIN D (ERGOCALCIFEROL) 1.25 MG (50000 UNIT) PO CAPS: 50000.0000 [IU] | ORAL_CAPSULE | ORAL | 0 refills | Status: AC

## 2023-10-09 MED ORDER — LEVETIRACETAM 250 MG PO TABS: 1750.0000 mg | ORAL_TABLET | Freq: Two times a day (BID) | ORAL | 0 refills | Status: DC

## 2023-10-09 MED ORDER — POTASSIUM CHLORIDE CRYS ER 20 MEQ PO TBCR
40.0000 meq | EXTENDED_RELEASE_TABLET | Freq: Once | ORAL | Status: AC
  Administered 2023-10-09: 40 meq via ORAL
  Filled 2023-10-09: qty 2

## 2023-10-09 MED ORDER — AMOXICILLIN-POT CLAVULANATE 875-125 MG PO TABS: 1.0000 | ORAL_TABLET | Freq: Two times a day (BID) | ORAL | 0 refills | Status: AC

## 2023-10-09 NOTE — Telephone Encounter (Signed)
 Pt's daughter, Anthony Bateman, advised and agreed to keep 6/9 appt

## 2023-10-09 NOTE — TOC Transition Note (Signed)
 Transition of Care [facility]) - Discharge Note   Patient Details  Name: [name] MRN: [id number] Date of Birth: 03-02-1966  [facility] CM/SW Contact:  Jonathan Neighbor, RN Phone Number: 10/09/2023, 11:38 AM   Clinical Narrative:     Pt is discharging home with family. Outpatient arranged and information on the AVS.  BSC for home is at the bedside.  Pt has transportation home.   Final next level of care: Home w Home Health Services Barriers to Discharge: No Barriers Identified   Patient Goals and CMS Choice   CMS Medicare.gov Compare Post Acute Care list provided to:: Patient Represenative (must comment) Choice offered to / list presented to : Adult Children, Sibling      Discharge Placement                       Discharge Plan and Services Additional resources added to the After Visit Summary for     Discharge Planning Services: CM Consult            DME Arranged: Bedside commode DME Agency: [employer] Date DME Agency Contacted: 10/08/23   Representative spoke with at DME Agency: Zack            Social Drivers of Health (SDOH) Interventions SDOH Screenings   Food Insecurity: No Food Insecurity (10/05/2023)  Housing: Unknown (10/05/2023)  Transportation Needs: No Transportation Needs (10/05/2023)  Utilities: Not At Risk (10/05/2023)  Depression (PHQ2-9): Low Risk  (06/03/2023)  Financial Resource Strain: Medium Risk (06/13/2021)  Physical Activity: Unknown (04/23/2021)  Social Connections: Unknown (09/16/2021)   Received from [facility]  Stress: No Stress Concern Present (04/23/2021)  Tobacco Use: Low Risk  (10/04/2023)   Received from [facility]     Readmission Risk Interventions     No data to display

## 2023-10-09 NOTE — Discharge Summary (Signed)
 Physician Discharge Summary  [name] WJX:[id number] DOB: August 19, 1965 DOA: 10/05/2023  PCP: [place] At[place], NP  Admit date: 10/05/2023 Discharge date: 10/09/2023  Admitted From: Home Disposition: Home  Recommendations for Outpatient Follow-up:  Follow up with PCP in 1-2 weeks Please obtain BMP/CBC in one week your next doctors visit.  Keppra  increased to 1750 twice daily 2 more days of p.o. Augmentin    Discharge Condition: Stable CODE STATUS: Home Diet recommendation: Low-salt  Brief/Interim Summary: Brief Narrative:   58 year old with past medical history significant for seizures, Bell's palsy, brain cancer, OSA, hypertension, hyperlipidemia, hypothyroidism, migraine headache presented with recurrent seizures episodes, witnessed by family member.   Patient was also found to have pneumonia.  Started on IV antibiotics.  No further seizures were noticed in the hospital but Keppra  dose was adjusted.  EEG showed some sharp waves in the left temporal region which could be artifact.  Patient was seen by neurology.  Keppra  dose was increased. Eventually family opted to take the patient home with home health services. Medically stable for discharge today.  Assessment & Plan:  Principal Problem:   Generalized seizure disorder ([name]) Active Problems:   Seizure disorder ([name])   Essential hypertension   Hypothyroid   OSA (obstructive sleep apnea)   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)   Carcinoma of head of pancreas ([name])      Recurrent seizures episode Tonic-clonic episodes witnessed by daughter at home.  EEG here showed sharp waves in the left temporal region which could be artifact.  Seen by neurology.  Recommended to increase Keppra  to 1750 mg twice daily, continue Vimpat   Left lower lobe pneumonia Started on Rocephin and azithromycin .  Transition to 2 more days of p.o. Augmentin    Vitamin D  deficiency Vitamin D  supplements    Hypokalemia: Repletion   Low normal B12: Supplements   Hypothyroidism  on Synthroid .  Not significantly elevated free T4.     History of GBM: Glioblastoma with Isocitrate  dehydrogenase gene wild-type. Status post left temporal craniotomy resection by Dr. Dorothy Gates 11/2020.  Follows outpatient Dr. Mark Sil.  Avastin  on hold.  Continue hydrocortisone  10 mg daily. MRI was reviewed by Dr. Mark Sil   History of CVA with baseline right-sided weakness: Resume aspirin .    Hypertension: Norvasc , IV as needed   Estimated body mass index is 27.72 kg/m as calculated from the following:   Height as of 10/02/23: 5\' 7"  (1.702 m).   Weight as of 10/02/23: 80.3 kg.  PT/OT-CIR     DVT prophylaxis: Heparin  Code Status: Full Code Family Communication: Daughter updated Disposition Plan:  Discharge  Subjective: Feeling well stable for discharge Daughter updated by me today Examination:  General exam: Appears calm and comfortable  Respiratory system: Clear to auscultation. Respiratory effort normal. Cardiovascular system: S1 & S2 heard, RRR. No JVD, murmurs, rubs, gallops or clicks. No pedal edema. Gastrointestinal system: Abdomen is nondistended, soft and nontender. No organomegaly or masses felt. Normal bowel sounds heard. Central nervous system: Alert and oriented. No focal neurological deficits. Extremities: Symmetric 5 x 5 power. Skin: No rashes, lesions or ulcers Psychiatry: Judgement and insight appear normal. Mood & affect appropriate.    Discharge Diagnoses:  Principal Problem:   Generalized seizure disorder ([name]) Active Problems:   Seizure disorder ([name])   Essential hypertension   Hypothyroid   OSA (obstructive sleep apnea)   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)   Carcinoma of head of pancreas [facility])      Discharge Exam: Vitals:  10/09/23 0339 10/09/23 0739  BP: 122/84 (!) 134/90  Pulse: 86 89  Resp: 17   Temp: 98 F (36.7 C) 98.3 F  (36.8 C)  SpO2: 98% 94%   Vitals:   10/08/23 1947 10/09/23 0000 10/09/23 0339 10/09/23 0739  BP: (!) 140/92 (!) 153/87 122/84 (!) 134/90  Pulse: 78 85 86 89  Resp: 18 16 17    Temp: 98.7 F (37.1 C) 98 F (36.7 C) 98 F (36.7 C) 98.3 F (36.8 C)  TempSrc: Oral   Oral  SpO2: 96% 97% 98% 94%      Discharge Instructions  Discharge Instructions     Ambulatory referral to Occupational Therapy   Complete by: As directed    Ambulatory referral to Physical Therapy   Complete by: As directed    Ambulatory referral to Speech Therapy   Complete by: As directed       Allergies as of 10/09/2023   No Known Allergies      Medication List     TAKE these medications    amLODipine  10 MG tablet Commonly known as: NORVASC  TAKE 1 TABLET BY MOUTH EVERY DAY   amoxicillin -clavulanate 875-125 MG tablet Commonly known as: AUGMENTIN  Take 1 tablet by mouth 2 (two) times daily for 2 days.   aspirin  EC 325 MG tablet Take 1 tablet (325 mg total) by mouth daily.   cyanocobalamin  500 MCG tablet Commonly known as: VITAMIN B12 Take 1 tablet (500 mcg total) by mouth daily.   hydrocortisone  10 MG tablet Commonly known as: CORTEF  Take 1 tablet (10 mg total) by mouth daily.   Lacosamide  100 MG Tabs Take 2 tablets (200 mg total) by mouth 2 (two) times daily.   levETIRAcetam  250 MG tablet Commonly known as: KEPPRA  Take 7 tablets (1,750 mg total) by mouth 2 (two) times daily. What changed:  medication strength See the new instructions.   levothyroxine  100 MCG tablet Commonly known as: SYNTHROID  TAKE 1 TABLET BY MOUTH EVERY DAY   LORazepam  2 MG tablet Commonly known as: ATIVAN  TAKE 1 TABLET (2 MG TOTAL) BY MOUTH EVERY 8 (EIGHT) HOURS AS NEEDED FOR SEIZURE.   losartan  50 MG tablet Commonly known as: COZAAR  Take 2 tablets (100 mg total) by mouth daily.   ondansetron  4 MG disintegrating tablet Commonly known as: ZOFRAN -ODT Take 1 tablet (4 mg total) by mouth every 8 (eight) hours  as needed for nausea or vomiting.   Potassium Chloride  ER 20 MEQ Tbcr Take 1 tablet (20 mEq total) by mouth daily. What changed: when to take this   prochlorperazine  10 MG tablet Commonly known as: COMPAZINE  Take 10 mg by mouth every 6 (six) hours as needed for nausea or vomiting.   Vitamin D  (Ergocalciferol ) 1.25 MG (50000 UNIT) Caps capsule Commonly known as: DRISDOL  Take 1 capsule (50,000 Units total) by mouth every 7 (seven) days. Start taking on: October 13, 2023               Durable Medical Equipment  (From admission, onward)           Start     Ordered   10/08/23 1457  For home use only DME Bedside commode  Once       Question:  Patient needs a bedside commode to treat with the following condition  Answer:  Weakness   10/08/23 1456            Follow-up Information     [facility]. Schedule an appointment as soon as  possible for a visit.   Specialty: Rehabilitation Contact information: [address] [place]  [address] [phone]               No Known Allergies  You were cared for by a hospitalist during your hospital stay. If you have any questions about your discharge medications or the care you received while you were in the hospital after you are discharged, you can call the unit and asked to speak with the hospitalist on call if the hospitalist that took care of you is not available. Once you are discharged, your primary care physician will handle any further medical issues. Please note that no refills for any discharge medications will be authorized once you are discharged, as it is imperative that you return to your primary care physician (or establish a relationship with a primary care physician if you do not have one) for your aftercare needs so that they can reassess your need for medications and monitor your lab values.  You were cared for by a hospitalist during your hospital stay. If you  have any questions about your discharge medications or the care you received while you were in the hospital after you are discharged, you can call the unit and asked to speak with the hospitalist on call if the hospitalist that took care of you is not available. Once you are discharged, your primary care physician will handle any further medical issues. Please note that NO REFILLS for any discharge medications will be authorized once you are discharged, as it is imperative that you return to your primary care physician (or establish a relationship with a primary care physician if you do not have one) for your aftercare needs so that they can reassess your need for medications and monitor your lab values.  Please request your Prim.MD to go over all Hospital Tests and Procedure/Radiological results at the follow up, please get all Hospital records sent to your Prim MD by signing hospital release before you go home.  Get CBC, CMP, 2 view Chest X ray checked  by Primary MD during your next visit or SNF MD in 5-7 days ( we routinely change or add medications that can affect your baseline labs and fluid status, therefore we recommend that you get the mentioned basic workup next visit with your PCP, your PCP may decide not to get them or add new tests based on their clinical decision)  On your next visit with your primary care physician please Get Medicines reviewed and adjusted.  If you experience worsening of your admission symptoms, develop shortness of breath, life threatening emergency, suicidal or homicidal thoughts you must seek medical attention immediately by calling 911 or calling your MD immediately  if symptoms less severe.  You Must read complete instructions/literature along with all the possible adverse reactions/side effects for all the Medicines you take and that have been prescribed to you. Take any new Medicines after you have completely understood and accpet all the possible adverse  reactions/side effects.   Do not drive, operate heavy machinery, perform activities at heights, swimming or participation in water activities or provide baby sitting services if your were admitted for syncope or siezures until you have seen by Primary MD or a Neurologist and advised to do so again.  Do not drive when taking Pain medications.   Procedures/Studies: VAS US  LOWER EXTREMITY VENOUS (DVT) Result Date: 10/08/2023  Lower Venous DVT Study Patient Name:  CHARINA FONS  Date of Exam:  10/08/2023 Medical Rec #: [id number]         Accession #:    [id number] Date of Birth: 1965-06-04         Patient Gender: F Patient Age:   71 years Exam Location:  [facility] Procedure:      VAS US  LOWER EXTREMITY VENOUS (DVT) Referring Phys: Clifford Dam REGALADO --------------------------------------------------------------------------------  Indications: Pain.  Risk Factors: None identified. Comparison Study: No prior studies. Performing Technologist: Lerry Ransom RVT  Examination Guidelines: A complete evaluation includes B-mode imaging, spectral Doppler, color Doppler, and power Doppler as needed of all accessible portions of each vessel. Bilateral testing is considered an integral part of a complete examination. Limited examinations for reoccurring indications may be performed as noted. The reflux portion of the exam is performed with the patient in reverse Trendelenburg.  +---------+---------------+---------+-----------+----------+--------------+ RIGHT    CompressibilityPhasicitySpontaneityPropertiesThrombus Aging +---------+---------------+---------+-----------+----------+--------------+ CFV      Full           Yes      Yes                                 +---------+---------------+---------+-----------+----------+--------------+ SFJ      Full                                                        +---------+---------------+---------+-----------+----------+--------------+ FV Prox  Full                                                         +---------+---------------+---------+-----------+----------+--------------+ FV Mid   Full                                                        +---------+---------------+---------+-----------+----------+--------------+ FV DistalFull                                                        +---------+---------------+---------+-----------+----------+--------------+ PFV      Full                                                        +---------+---------------+---------+-----------+----------+--------------+ POP      Full           Yes      Yes                                 +---------+---------------+---------+-----------+----------+--------------+ PTV      Full                                                        +---------+---------------+---------+-----------+----------+--------------+  PERO     Full                                                        +---------+---------------+---------+-----------+----------+--------------+   +---------+---------------+---------+-----------+----------+--------------+ LEFT     CompressibilityPhasicitySpontaneityPropertiesThrombus Aging +---------+---------------+---------+-----------+----------+--------------+ CFV      Full           Yes      Yes                                 +---------+---------------+---------+-----------+----------+--------------+ SFJ      Full                                                        +---------+---------------+---------+-----------+----------+--------------+ FV Prox  Full                                                        +---------+---------------+---------+-----------+----------+--------------+ FV Mid   Full                                                        +---------+---------------+---------+-----------+----------+--------------+ FV DistalFull                                                         +---------+---------------+---------+-----------+----------+--------------+ PFV      Full                                                        +---------+---------------+---------+-----------+----------+--------------+ POP      Full           Yes      Yes                                 +---------+---------------+---------+-----------+----------+--------------+ PTV      Full                                                        +---------+---------------+---------+-----------+----------+--------------+ PERO     Full                                                        +---------+---------------+---------+-----------+----------+--------------+  Summary: RIGHT: - There is no evidence of deep vein thrombosis in the lower extremity.  - No cystic structure found in the popliteal fossa.  LEFT: - There is no evidence of deep vein thrombosis in the lower extremity.  - No cystic structure found in the popliteal fossa.  *See table(s) above for measurements and observations. Electronically signed by Jimmye Moulds MD on 10/08/2023 at 2:18:06 PM.    Final    MR BRAIN W WO CONTRAST Result Date: 10/07/2023 CLINICAL DATA:  Brain/CNS neoplasm, assess treatment response 58 year old female with glioblastoma, IDH wild-type. Left temporal craniotomy, IMRT in 2022. Chemotherapy continues, repeat radiation therapy in February this year EXAM: MRI HEAD WITHOUT AND WITH CONTRAST TECHNIQUE: Multiplanar, multiecho pulse sequences of the brain and surrounding structures were obtained without and with intravenous contrast. CONTRAST:  8mL GADAVIST  GADOBUTROL  1 MMOL/ML IV SOLN COMPARISON:  MRI August 14, 2023. FINDINGS: Brain: Continued fading of restricted diffusion in the left corona radiata with similar restricted diffusion in the left periatrial region. There is mildly increased patchy and curvilinear enhancement anteriorly in the left inferior frontal gyrus with similar left periatrial/corona  radiata patchy enhancement. No substantial change in areas of intrinsic T1 hyperintensity in these regions. Question mildly progressed T2/FLAIR hyperintensity also in the inferior left frontal gyrus. Additional T2/FLAIR hyperintensities in the left cerebral hemisphere are not substantially changed. No significant mass effect or midline shift. No evidence of acute hemorrhage or hydrocephalus. Similar anterior left temporal lobe resection site without abnormal enhancement in this region. Vascular: Major arterial flow voids are maintained skull base. Skull and upper cervical spine: Stable left-sided craniotomy. Otherwise, unremarkable bone marrow. Sinuses/Orbits: Clear sinuses.  No acute findings. Other: No mastoid effusions. IMPRESSION: Interval mildly increased enhancement and T2 hyperintensity in the left inferior frontal gyrus. Findings could represent continued evolution of treatment change or slow progression of disease. Recommend continued imaging follow-up. Electronically Signed   By: Stevenson Elbe M.D.   On: 10/07/2023 03:06   DG CHEST PORT 1 VIEW Result Date: 10/06/2023 CLINICAL DATA:  Dyspnea EXAM: PORTABLE CHEST 1 VIEW COMPARISON:  February 16, 2022 FINDINGS: Left lower lobe infiltrates and atelectasis with a small effusion could correlate with pneumonia IMPRESSION: Left lower lobe infiltrates and atelectasis with a small effusion could correlate with pneumonia. Electronically Signed   By: Fredrich Jefferson M.D.   On: 10/06/2023 08:45   EEG adult Result Date: 10/05/2023 Arleene Lack, MD     10/05/2023  9:29 PM Patient Name: BETHENY SUCHECKI MRN: [id number] Epilepsy Attending: Arleene Lack Referring Physician/Provider: Laymond Priestly, NP Date: 10/05/2023 Duration: 22.25 mins Patient history: 58yo F with seizure history on vimpat  and keppra  at home, presented to OSH for 5-6 seizures. EEG to evaluate for seizure Level of alertness: Awake, asleep AEDs during EEG study: LEV, LCM Technical aspects: This  EEG study was done with scalp electrodes positioned according to the 10-20 International system of electrode placement. Electrical activity was reviewed with band pass filter of 1-70Hz , sensitivity of 7 uV/mm, display speed of 76mm/sec with a 60Hz  notched filter applied as appropriate. EEG data were recorded continuously and digitally stored.  Video monitoring was available and reviewed as appropriate. Description: The posterior dominant rhythm consists of 8-9Hz  activity of moderate voltage (25-35 uV) seen predominantly in posterior head regions, asymmetric( left<right) and reactive to eye opening and eye closing. Sleep was characterized by vertex waves, sleep spindles (12 to 14 Hz), maximal frontocentral region. EEG showed continuous polymorphic rhythmic 3 to 6 Hz  theta-delta slowing in left hemisphere. Additionally there was sharply contoured 3-6hz  theta-delta slowing left temporal region consistent with breach artifact. Sharp waves were noted in left temporal region, qasi-periodic at 1hz .  Hyperventilation and photic stimulation were not performed.   ABNORMALITY - Sharp wave, left temporal region - Breach artifact, left temporal region - Continuous slow, left hemisphere - Background asymmetry, left<right IMPRESSION: This study showed evidence of epileptogenicity arising from left temporal region with increased risk of seizure recurrence. Additionally there was cortical dysfunction arising from left temporal region consistent with underlying craniotomy. No seizures were seen throughout the recording. If suspicion for ictal activity remains a concern, a prolonged study can be considered. Arleene Lack     The results of significant diagnostics from this hospitalization (including imaging, microbiology, ancillary and laboratory) are listed below for reference.     Microbiology: No results found for this or any previous visit (from the past 240 hours).   Labs: BNP (last 3 results) No results for  input(s): "BNP" in the last 8760 hours. Basic Metabolic Panel: Recent Labs  Lab 10/05/23 1846 10/06/23 0527 10/07/23 0801 10/07/23 0802 10/08/23 0436 10/09/23 0611  NA  --  137  --  139 137 136  K  --  3.2*  --  3.4* 3.6 3.3*  CL  --  108  --  102 101 103  CO2  --  22  --  22 23 22   GLUCOSE  --  107*  --  79 72 80  BUN  --  7  --  8 7 8   CREATININE  --  0.98  --  0.89 0.88 0.83  CALCIUM   --  9.0  --  9.5 9.6 9.5  MG 2.0  --  2.0  --  2.0 2.1  PHOS 3.1  --   --   --   --  4.2   Liver Function Tests: Recent Labs  Lab 10/06/23 0527  AST 26  ALT 34  ALKPHOS 73  BILITOT 3.7*  PROT 6.5  ALBUMIN  3.2*   No results for input(s): "LIPASE", "AMYLASE" in the last 168 hours. No results for input(s): "AMMONIA" in the last 168 hours. CBC: Recent Labs  Lab 10/05/23 1846 10/06/23 0527 10/07/23 0802 10/09/23 0611  WBC 6.5 8.0 7.5 3.8*  NEUTROABS 4.9  --   --   --   HGB 12.7 12.3 11.3* 12.0  HCT 38.6 38.3 35.1* 36.8  MCV 94.8 94.6 94.6 93.2  PLT 152 150 145* 186   Cardiac Enzymes: Recent Labs  Lab 10/05/23 1846  CKTOTAL 118   BNP: Invalid input(s): "POCBNP" CBG: No results for input(s): "GLUCAP" in the last 168 hours. D-Dimer No results for input(s): "DDIMER" in the last 72 hours. Hgb A1c No results for input(s): "HGBA1C" in the last 72 hours. Lipid Profile No results for input(s): "CHOL", "HDL", "LDLCALC", "TRIG", "CHOLHDL", "LDLDIRECT" in the last 72 hours. Thyroid  function studies No results for input(s): "TSH", "T4TOTAL", "T3FREE", "THYROIDAB" in the last 72 hours.  Invalid input(s): "FREET3" Anemia work up No results for input(s): "VITAMINB12", "FOLATE", "FERRITIN", "TIBC", "IRON", "RETICCTPCT" in the last 72 hours. Urinalysis    Component Value Date/Time   COLORURINE YELLOW 01/20/2022 0600   APPEARANCEUR CLEAR 01/20/2022 0600   LABSPEC >1.046 (H) 01/20/2022 0600   PHURINE 5.5 01/20/2022 0600   GLUCOSEU NEGATIVE 01/20/2022 0600   HGBUR NEGATIVE  01/20/2022 0600   HGBUR negative 01/26/2010 1528   BILIRUBINUR NEGATIVE 01/20/2022 0600   BILIRUBINUR n 05/23/2011 1649  KETONESUR 15 (A) 01/20/2022 0600   PROTEINUR 30 (A) 08/07/2023 1338   UROBILINOGEN 1.0 06/22/2012 1540   NITRITE NEGATIVE 01/20/2022 0600   LEUKOCYTESUR NEGATIVE 01/20/2022 0600   Sepsis Labs Recent Labs  Lab 10/05/23 1846 10/06/23 0527 10/07/23 0802 10/09/23 0611  WBC 6.5 8.0 7.5 3.8*   Microbiology No results found for this or any previous visit (from the past 240 hours).   Time coordinating discharge:  I have spent 35 minutes face to face with the patient and on the ward discussing the patients care, assessment, plan and disposition with other care givers. >50% of the time was devoted counseling the patient about the risks and benefits of treatment/Discharge disposition and coordinating care.   SIGNED:   Maggie Schooner, MD  [facility] 10/09/2023, 12:50 PM   If 7PM-7AM, please contact night-coverage

## 2023-10-09 NOTE — Telephone Encounter (Signed)
 T/C from pt's daughter stating pt is currently admitted and has had her MRI on 6/2.  6/5 MRI cancelled   Does pt need to keep her 6/9 appt to review the MRI? Please advise

## 2023-10-13 ENCOUNTER — Inpatient Hospital Stay (HOSPITAL_BASED_OUTPATIENT_CLINIC_OR_DEPARTMENT_OTHER): Admitting: Nurse Practitioner

## 2023-10-13 ENCOUNTER — Encounter: Payer: Self-pay | Admitting: Nurse Practitioner

## 2023-10-13 ENCOUNTER — Inpatient Hospital Stay: Attending: Internal Medicine | Admitting: Internal Medicine

## 2023-10-13 VITALS — BP 141/82 | HR 88 | Temp 98.2°F | Resp 15 | Wt 174.7 lb

## 2023-10-13 DIAGNOSIS — R530 Neoplastic (malignant) related fatigue: Secondary | ICD-10-CM

## 2023-10-13 DIAGNOSIS — Z800 Family history of malignant neoplasm of digestive organs: Secondary | ICD-10-CM | POA: Diagnosis not present

## 2023-10-13 DIAGNOSIS — F32A Depression, unspecified: Secondary | ICD-10-CM

## 2023-10-13 DIAGNOSIS — Z801 Family history of malignant neoplasm of trachea, bronchus and lung: Secondary | ICD-10-CM | POA: Insufficient documentation

## 2023-10-13 DIAGNOSIS — R519 Headache, unspecified: Secondary | ICD-10-CM | POA: Diagnosis not present

## 2023-10-13 DIAGNOSIS — Z9221 Personal history of antineoplastic chemotherapy: Secondary | ICD-10-CM | POA: Insufficient documentation

## 2023-10-13 DIAGNOSIS — R569 Unspecified convulsions: Secondary | ICD-10-CM | POA: Diagnosis not present

## 2023-10-13 DIAGNOSIS — C719 Malignant neoplasm of brain, unspecified: Secondary | ICD-10-CM | POA: Diagnosis not present

## 2023-10-13 DIAGNOSIS — G893 Neoplasm related pain (acute) (chronic): Secondary | ICD-10-CM

## 2023-10-13 DIAGNOSIS — C712 Malignant neoplasm of temporal lobe: Secondary | ICD-10-CM | POA: Insufficient documentation

## 2023-10-13 DIAGNOSIS — Z515 Encounter for palliative care: Secondary | ICD-10-CM | POA: Diagnosis not present

## 2023-10-13 DIAGNOSIS — Z79899 Other long term (current) drug therapy: Secondary | ICD-10-CM | POA: Diagnosis not present

## 2023-10-13 DIAGNOSIS — Z923 Personal history of irradiation: Secondary | ICD-10-CM | POA: Diagnosis not present

## 2023-10-13 MED ORDER — HYDROCODONE-ACETAMINOPHEN 5-325 MG PO TABS: 0.5000 | ORAL_TABLET | ORAL | 0 refills | Status: DC | PRN

## 2023-10-13 MED ORDER — ESCITALOPRAM OXALATE 10 MG PO TABS: 10.0000 mg | ORAL_TABLET | Freq: Every day | ORAL | 1 refills | Status: DC

## 2023-10-13 MED ORDER — LEVETIRACETAM 750 MG PO TABS: 1500.0000 mg | ORAL_TABLET | Freq: Two times a day (BID) | ORAL | 2 refills | Status: DC

## 2023-10-13 MED ORDER — HYDROCODONE-ACETAMINOPHEN 5-325 MG PO TABS: 0.5000 | ORAL_TABLET | ORAL | 0 refills | Status: AC | PRN

## 2023-10-13 NOTE — Progress Notes (Signed)
 Palliative Medicine [facility]  Telephone:([phone]) [phone] Fax:([phone]) [phone]   Name: [name] Date: 10/13/2023 MRN: [id number]  DOB: [birth year]  Patient Care Team: Alto Atta, NP as PCP - General (Family Medicine) Arlester Bence, MD as Referring Physician (Gastroenterology) Sonja San Felipe, MD as Consulting Physician (Oncology) Mark Sil Zachary K, MD as Consulting Physician (Oncology)    INTERVAL HISTORY: [name] is a 58 y.o. female with medical history of glioblastoma s/p resection (11/2020) IMRT, currently on dexamethasone  and Temodar , pancreatic lesion, hypertension, and hyperlipidemia. Palliative ask to see for symptom management and goals of care.   SOCIAL HISTORY:     reports that she has never smoked. She has never used smokeless tobacco. She reports that she does not drink alcohol and does not use drugs.  ADVANCE DIRECTIVES:  None on file   CODE STATUS: Full code  PAST MEDICAL HISTORY: Past Medical History:  Diagnosis Date   Bell's palsy    left side of face- notied in smile and eyes, if very tired   Brain cancer ([name])    Complication of anesthesia    N/V   Essential hypertension    External hemorrhoids    History of blood transfusion    post op Hemorroids   History of pneumonia    Hyperlipidemia    Hyperthyroidism    hx 58 years old- oral meds   Iron deficiency anemia    Migraine headache    Pneumonia    x 2 - years ago   PONV (postoperative nausea and vomiting)    PPD positive    Pulmonary nodules    not seen on plain cxr, first detected 1/07 re ct 10/08 pos IPPD > 15 mm 12/09 FOB/lavage 04/20/08 neg afb smear   PVC's (premature ventricular contractions)    Seizure ([name]) 11/15/2020   Sleep apnea    refused CPAP- mouth piece recommended   Tuberculosis    in her late 30's    ALLERGIES:  has no known allergies.  MEDICATIONS:  Current Outpatient Medications  Medication Sig Dispense Refill   amLODipine  (NORVASC ) 10  MG tablet TAKE 1 TABLET BY MOUTH EVERY DAY 90 tablet 1   aspirin  EC 325 MG tablet Take 1 tablet (325 mg total) by mouth daily. 30 tablet 2   escitalopram (LEXAPRO) 10 MG tablet Take 1 tablet (10 mg total) by mouth daily. 30 tablet 1   HYDROcodone -acetaminophen  (NORCO/VICODIN) 5-325 MG tablet Take 0.5-1 tablets by mouth every 4 (four) hours as needed for moderate pain (pain score 4-6). 45 tablet 0   hydrocortisone  (CORTEF ) 10 MG tablet Take 1 tablet (10 mg total) by mouth daily. 30 tablet 0   Lacosamide  100 MG TABS Take 2 tablets (200 mg total) by mouth 2 (two) times daily. 120 tablet 2   levETIRAcetam  (KEPPRA ) 750 MG tablet Take 2 tablets (1,500 mg total) by mouth 2 (two) times daily. 120 tablet 2   levothyroxine  (SYNTHROID ) 100 MCG tablet TAKE 1 TABLET BY MOUTH EVERY DAY 90 tablet 3   LORazepam  (ATIVAN ) 2 MG tablet TAKE 1 TABLET (2 MG TOTAL) BY MOUTH EVERY 8 (EIGHT) HOURS AS NEEDED FOR SEIZURE. 12 tablet 0   losartan  (COZAAR ) 50 MG tablet Take 2 tablets (100 mg total) by mouth daily.     ondansetron  (ZOFRAN -ODT) 4 MG disintegrating tablet Take 1 tablet (4 mg total) by mouth every 8 (eight) hours as needed for nausea or vomiting. 10 tablet 0   Potassium Chloride  ER 20 MEQ  TBCR Take 1 tablet (20 mEq total) by mouth daily. (Patient taking differently: Take 1 tablet by mouth every evening.) 90 tablet 3   prochlorperazine  (COMPAZINE ) 10 MG tablet Take 10 mg by mouth every 6 (six) hours as needed for nausea or vomiting.     vitamin B-12 (VITAMIN B12) 500 MCG tablet Take 1 tablet (500 mcg total) by mouth daily. 90 tablet 0   Vitamin D , Ergocalciferol , (DRISDOL ) 1.25 MG (50000 UNIT) CAPS capsule Take 1 capsule (50,000 Units total) by mouth every 7 (seven) days. 4 capsule 0   No current facility-administered medications for this visit.    VITAL SIGNS: There were no vitals taken for this visit. There were no vitals filed for this visit.  Estimated body mass index is 27.36 kg/m as calculated from the  following:   Height as of 10/02/23: 5\' 7"  (1.702 m).   Weight as of an earlier encounter on 10/13/23: 174 lb 11.2 oz (79.2 kg).   PERFORMANCE STATUS (ECOG) : 1 - Symptomatic but completely ambulatory   Physical Exam General: NAD Cardiovascular: regular rate and rhythm Pulmonary: normal breathing pattern Extremities: no edema, right side weakness  Skin: no rashes Neurological: AAO x3, slow to respond, left facial impairment   IMPRESSION: Discussed the use of AI scribe software for clinical note transcription with the patient, who gave verbal consent to proceed.  History of Present Illness [name] "[name]" is a 59 year old female with a history of glioblastoma who presents for ongoing management and support. She is accompanied by her cousin, brother on speaker phone. No acute distress. States she is trying to take things one day at a time.  Her extensive cancer affects her ability to communicate symptoms accurately, often confusing 'yeses and nos'. Also results in right-sided weakness, causing her to sometimes require assistance with walking, as her right side 'kind of drags'.  In terms of her daily activities, she continues to work in a relatively easy job that allows her to rest when needed. Her appetite varies, with some days being better than others. No recent falls since returning home from the hospital, and no issues with urination or bowel movements.  There is a significant impact on her social life, as she is experiencing depression and isolation. However, she has a birthday celebration planned. Patient started on Lexapro per Dr. Mark Sil. Education provided on use and all questions answered.   She experiences severe headaches almost daily, which are not effectively managed with Aleve. The headaches are described as severe, and the frequency and intensity are difficult to ascertain as she sometimes 'just lays there and takes it'. Patient and family requesting something for pain. We  discussed use of hydrocodone  as this previously was tolerated. Education provided on use, efficacy, administration, and potential side effects. Patient starting on Lexapro, family advised to hold off on starting use of hydrocodone  for 3-4 days to allow time to evaluate use and prevent unwanted side effects an unawareness of which medication this is related to. They verbalized understanding.   All questions answered and support provided.    I discussed the importance of continued conversation with family and their medical providers regarding overall plan of care and treatment options, ensuring decisions are within the context of the patients values and GOCs.  Goals of care 06/19/23: We discussed at length patient's current cancer recurrence, overall health, and was most important to [name] and her family.   The patient's emotional and psychological well-being is also a significant concern. The  recurrence of the cancer has understandably caused fear and anxiety.  she has a strong support system, including a cousin and her brother who is also her pastor.  They are both actively involved in her care. The patient expresses a desire for transparency and honesty from the medical team, indicating a preference for direct communication about her condition and prognosis.   The patient's overall goal is not only to fight the disease but also to help others who are dealing with cancer. Despite the challenges, the patient remains hopeful and determined, demonstrating resilience in the face of adversity.  We discussed at length while [name] is remaining hopeful for the best she and family are also preparing for the worst (further disease progression).  She has a strong [place] faith and is relying on this to support her throughout this upcoming journey.   Patient and family are realistic in their understanding.  We discussed what care would look like if further progression or decreasing quality of life.  Education provided  on hospice support as inquired by patient and family.  Again Ms. Carano's focus on treating the treatable with hopes of similar positive responses as she did in the past.   She and family verbalized understanding of treatment plan and expressed wishes to maintain close contact for ongoing support as she navigates the upcoming weeks involved in treatment.  They are aware palliative will continue to support in collaboration with her oncology medical team as needed.   08/29/2022: Ms. Howdyshell expresses the desire to live each day to its fullest. She is concerned that the new lesions on top of her head represent cancer spread. She expresses being frustrated with seizure episodes as well as a decreased ability to concentrate. Emotional support provided through active listening and quiet presence. Assessment & Plan Glioblastoma  Glioblastoma with headaches, depression, and right-sided weakness. Confusion with yes/no responses. Continues working with accommodations. Per family; plans for for follow-up scans   Headaches Frequent headaches inadequately managed with Aleve. Consideration for stronger analgesic due to severity. - Prescribed hydrocodone  2.5-5 mg, instruct to take one every eight hours as needed for severe headaches. Start with half a tablet to assess tolerance.  Right-sided weakness Chronic right-sided weakness secondary to brain injury, affecting mobility and balance. - Encourage safe ambulation with assistance as needed.  Depression Depression and isolation likely exacerbated by chronic illness and brain cancer. Lexapro initiated to address symptoms per Dr. Mark Sil. - Start Lexapro 10 mg daily. Monitor for side effects such as dizziness or weakness. Advise to report any significant changes in equilibrium or quality of life.  Follow-up Follow-up in 3-4 weeks. Sooner if needed.  - Remain available for changes or needs before next appointment.  Patient expressed understanding and was in  agreement with this plan. She also understands that She can call the clinic at any time with any questions, concerns, or complaints.   Visit consisted of counseling and education dealing with the complex and emotionally intense issues of symptom management and palliative care in the setting of serious and potentially life-threatening illness.  Dellia Ferguson, AGPCNP-BC  Palliative Medicine Team/[facility]

## 2023-10-13 NOTE — Progress Notes (Signed)
 [facility] at [facility] [address]  [place], [place] [address] [phone]   Interval Evaluation  Date of Service: 10/13/23 Patient Name: [name] Patient MRN: [id number] Patient DOB: August 31, 1965 Provider: Mamie Searles, MD  Identifying Statement:  [name] is a 58 y.o. 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)  11/21/2020 Surgery   Left temporal craniotomy, resection with Dr. Ali Antonio.  Path is glioblastoma, IDHwt   12/27/2020 - 02/07/2021 Radiation Therapy   IMRT and concurrent Temodar  [name])   03/12/2021 - 08/26/2021 Chemotherapy   Completes 5 cycles of adjuvant 5-day Temodar    08/27/2021 Progression   POD #1   09/06/2021 -  Chemotherapy   Initiates second line therapy; CCNU 90mg /m2 PO q6 weeks and avastin  10mg /kg IV q2 weeks   12/28/2021 Progression   POD #2   01/17/2022 -  Chemotherapy   Third line therapy with Irinotecan  and Avastin  q2 weeks   11/21/2022 -  Chemotherapy   Transitions to single agent Avastin    05/21/2023 Progression   POD #3   06/18/2023 - 07/01/2023 Radiation Therapy   Re-irradiation 35/10 with concurrent avastin  10mg /kg IV q2 weeks [name])     Biomarkers:  MGMT Methylated.  IDH 1/2 Wild type.  EGFR Unknown  TERT Unknown   Interval History: [name] presents today for follow up after recent admission for pneumonia, breakthrough seizure.  Patient and family report mainly severe fatigue since the hospitalization, this had already been a problem previously. She otherwise continues to have difficulty communicating/speaking.  Her gait is less independent and more of a shuffle, though not needing assistance.  No further seizure events.  She continues on vimpat  to 200mg  twice per day, Keppra  1500mg  BID.  Hydrocortisone  at 10mg  daily.    H+P (11/25/20) Patient presented to medical attention this past  month with first ever seizure.  She describes episode of sudden onset "disconnection" with tongue biting and urination, followed by period of confusion.  CNS imaging demonstrated enhancing mass within left anterior temporal lobe, which was mostly resected by Dr. Ali Antonio on 11/21/20.  Since surgery she had not had recurrence of seizures.  She complains of painful cramping of her right leg, which started ~1 week prior.  At this time the cramping is her main complaint.  Otherwise fully functional, independent, would like to return to work if possible.  Medications: Current Outpatient Medications on File Prior to Visit  Medication Sig Dispense Refill   amLODipine  (NORVASC ) 10 MG tablet TAKE 1 TABLET BY MOUTH EVERY DAY 90 tablet 1   aspirin  EC 325 MG tablet Take 1 tablet (325 mg total) by mouth daily. 30 tablet 2   hydrocortisone  (CORTEF ) 10 MG tablet Take 1 tablet (10 mg total) by mouth daily. 30 tablet 0   Lacosamide  100 MG TABS Take 2 tablets (200 mg total) by mouth 2 (two) times daily. 120 tablet 2   levothyroxine  (SYNTHROID ) 100 MCG tablet TAKE 1 TABLET BY MOUTH EVERY DAY 90 tablet 3   LORazepam  (ATIVAN ) 2 MG tablet TAKE 1 TABLET (2 MG TOTAL) BY MOUTH EVERY 8 (EIGHT) HOURS AS NEEDED FOR SEIZURE. 12 tablet 0   losartan  (COZAAR ) 50 MG tablet Take 2 tablets (100 mg total) by mouth daily.     ondansetron  (ZOFRAN -ODT) 4 MG disintegrating tablet Take 1 tablet (4 mg total) by mouth every 8 (eight) hours as needed for nausea or vomiting. 10 tablet  0   Potassium Chloride  ER 20 MEQ TBCR Take 1 tablet (20 mEq total) by mouth daily. (Patient taking differently: Take 1 tablet by mouth every evening.) 90 tablet 3   prochlorperazine  (COMPAZINE ) 10 MG tablet Take 10 mg by mouth every 6 (six) hours as needed for nausea or vomiting.     vitamin B-12 (VITAMIN B12) 500 MCG tablet Take 1 tablet (500 mcg total) by mouth daily. 90 tablet 0   Vitamin D , Ergocalciferol , (DRISDOL ) 1.25 MG (50000 UNIT) CAPS capsule Take 1  capsule (50,000 Units total) by mouth every 7 (seven) days. 4 capsule 0   No current facility-administered medications on file prior to visit.    Allergies: No Known Allergies Past Medical History:  Past Medical History:  Diagnosis Date   Bell's palsy    left side of face- notied in smile and eyes, if very tired   Brain cancer ([name])    Complication of anesthesia    N/V   Essential hypertension    External hemorrhoids    History of blood transfusion    post op Hemorroids   History of pneumonia    Hyperlipidemia    Hyperthyroidism    hx 58 years old- oral meds   Iron deficiency anemia    Migraine headache    Pneumonia    x 2 - years ago   PONV (postoperative nausea and vomiting)    PPD positive    Pulmonary nodules    not seen on plain cxr, first detected 1/07 re ct 10/08 pos IPPD > 15 mm 12/09 FOB/lavage 04/20/08 neg afb smear   PVC's (premature ventricular contractions)    Seizure ([name]) 11/15/2020   Sleep apnea    refused CPAP- mouth piece recommended   Tuberculosis    in her late 30's   Past Surgical History:  Past Surgical History:  Procedure Laterality Date   ABDOMINAL HYSTERECTOMY     APPLICATION OF CRANIAL NAVIGATION N/A 11/21/2020   Procedure: APPLICATION OF CRANIAL NAVIGATION;  Surgeon: [name] Champion, MD;  Location: [facility] OR;  Service: Neurosurgery;  Laterality: N/A;   BREAST BIOPSY Right 2014   BREAST BIOPSY Left 10/29/2019   CHOLECYSTECTOMY  2014   COLONOSCOPY  05/04/2009   prolapsing external hemorrhoids   CRANIOTOMY Left 11/21/2020   Procedure: Left craniotomy for tumor resection;  Surgeon: [name] Champion, MD;  Location: [facility] OR;  Service: Neurosurgery;  Laterality: Left;   ENDOMETRIAL ABLATION     HEMORRHOID SURGERY     x 3   TUBAL LIGATION     Social History:  Social History   Socioeconomic History   Marital status: Divorced    Spouse name: Not on file   Number of children: 2   Years of education: Not on file   Highest education level:  Associate degree: occupational, Scientist, product/process development, or vocational program  Occupational History   Occupation: accounts IT trainer: [employer]  Tobacco Use   Smoking status: Never   Smokeless tobacco: Never  Vaping Use   Vaping status: Never Used  Substance and Sexual Activity   Alcohol use: No    Alcohol/week: 0.0 standard drinks of alcohol   Drug use: No   Sexual activity: Not on file  Other Topics Concern   Not on file  Social History Narrative   Divorced with 2 children   She works as a Merchandiser, retail in Audiological scientist    Possible TB exposure 2008 (after nodules found)   From [place] lived there and [place]  Never owned birds      Social Drivers of Corporate investment banker Strain: Medium Risk (06/13/2021)   Overall Financial Resource Strain (CARDIA)    Difficulty of Paying Living Expenses: Somewhat hard  Food Insecurity: No Food Insecurity (10/05/2023)   Hunger Vital Sign    Worried About Running Out of Food in the Last Year: Never true    Ran Out of Food in the Last Year: Never true  Transportation Needs: No Transportation Needs (10/05/2023)   PRAPARE - Administrator, Civil Service (Medical): No    [name] of Transportation (Non-Medical): No  Physical Activity: Unknown (04/23/2021)   Exercise Vital Sign    Days of Exercise per Week: 0 days    Minutes of Exercise per Session: Not on file  Stress: No Stress Concern Present (04/23/2021)   [employer] of Occupational Health - Occupational Stress Questionnaire    Feeling of Stress : Only a little  Social Connections: Unknown (09/16/2021)   Received from [facility]   Social Network    Social Network: Not on file  Intimate Partner Violence: Not At Risk (10/05/2023)   Humiliation, Afraid, Rape, and Kick questionnaire    Fear of Current or Ex-Partner: No    Emotionally Abused: No    Physically Abused: No    Sexually Abused: No   Family History:  Family History  Problem Relation Age of Onset   Diabetes type  II Mother    Kidney failure Sister    Diabetes Mellitus II Sister    Cancer Maternal Aunt        Stomach cancer   Colon cancer Maternal Aunt        aunts and uncles   Diabetes Mellitus II Maternal Aunt    Pancreatic cancer Maternal Aunt    Colon cancer Maternal Uncle    Diabetes Mellitus II Maternal Uncle    Heart disease Maternal Grandmother        aunts, uncles, sister   Diabetes Mellitus II Maternal Grandmother    Lung cancer Other        aunts and uncles    Review of Systems: Constitutional: poor appetite Eyes: blurriness of vision Ears, nose, mouth, throat, and face: Doesn't report sore throat Respiratory: Doesn't report cough, dyspnea or wheezes Cardiovascular: Doesn't report palpitation, chest discomfort  Gastrointestinal:  Doesn't report nausea, constipation, diarrhea GU: Doesn't report incontinence Skin: Doesn't report skin rashes Neurological: Per HPI Musculoskeletal: bilateral shoulder pain Behavioral/Psych: +anxiety  Physical Exam: Vitals:   10/13/23 1000  BP: (!) 141/82  Pulse: 88  Resp: 15  Temp: 98.2 F (36.8 C)  SpO2: 100%     KPS: 70. General: Alert, cooperative, pleasant, in no acute distress Head: Normal EENT: No conjunctival injection or scleral icterus.  Lungs: Resp effort normal Cardiac: Regular rate Abdomen: Non-distended abdomen Skin: No rashes cyanosis or petechiae. Extremities: No clubbing or edema  Neurologic Exam: Mental Status: Awake, alert, attentive to examiner. Oriented to self and environment. Language is notable for impairment in fluency.  Comprehension limited with multi-step commands.  Impaired short term recall. Cranial Nerves: Visual acuity is grossly normal. Visual fields are full. Extra-ocular movements intact. No ptosis. Face L LMN paresis, chronic. Motor: Tone and bulk are normal. Power is full in both arms and legs. Reflexes are symmetric, no pathologic reflexes present.  Sensory: Intact to light touch Gait:  Dystaxic  Labs: I have reviewed the data as listed    Component Value Date/Time  NA 136 10/09/2023 0611   K 3.3 (L) 10/09/2023 0611   CL 103 10/09/2023 0611   CO2 22 10/09/2023 0611   GLUCOSE 80 10/09/2023 0611   BUN 8 10/09/2023 0611   CREATININE 0.83 10/09/2023 0611   CREATININE 1.04 (H) 08/19/2023 1033   CREATININE 0.92 01/25/2020 1424   CALCIUM  9.5 10/09/2023 0611   PROT 6.5 10/06/2023 0527   ALBUMIN  3.2 (L) 10/06/2023 0527   AST 26 10/06/2023 0527   AST 14 (L) 08/19/2023 1033   ALT 34 10/06/2023 0527   ALT 12 08/19/2023 1033   ALKPHOS 73 10/06/2023 0527   BILITOT 3.7 (H) 10/06/2023 0527   BILITOT 1.1 08/19/2023 1033   GFRNONAA >60 10/09/2023 0611   GFRNONAA >60 08/19/2023 1033   GFRNONAA 72 01/25/2020 1424   GFRAA 83 01/25/2020 1424   Lab Results  Component Value Date   WBC 3.8 (L) 10/09/2023   NEUTROABS 4.9 10/05/2023   HGB 12.0 10/09/2023   HCT 36.8 10/09/2023   MCV 93.2 10/09/2023   PLT 186 10/09/2023    Imaging:  [facility] Clinician Interpretation: I have personally reviewed the CNS images as listed.  My interpretation, in the context of the patient's clinical presentation, is treatment effect vs true progression, favor the former.    VAS US  LOWER EXTREMITY VENOUS (DVT) Result Date: 10/08/2023  Lower Venous DVT Study Patient Name:  [name]  Date of Exam:   10/08/2023 Medical Rec #: [id number]         Accession #:    [id number] Date of Birth: 12-Feb-1966         Patient Gender: F Patient Age:   44 years Exam Location:  [facility] Procedure:      VAS US  LOWER EXTREMITY VENOUS (DVT) Referring Phys: [name] --------------------------------------------------------------------------------  Indications: Pain.  Risk Factors: None identified. Comparison Study: No prior studies. Performing Technologist: [name] RVT  Examination Guidelines: A complete evaluation includes B-mode imaging, spectral Doppler, color Doppler, and power Doppler as needed of  all accessible portions of each vessel. Bilateral testing is considered an integral part of a complete examination. Limited examinations for reoccurring indications may be performed as noted. The reflux portion of the exam is performed with the patient in reverse Trendelenburg.  +---------+---------------+---------+-----------+----------+--------------+ RIGHT    CompressibilityPhasicitySpontaneityPropertiesThrombus Aging +---------+---------------+---------+-----------+----------+--------------+ CFV      Full           Yes      Yes                                 +---------+---------------+---------+-----------+----------+--------------+ SFJ      Full                                                        +---------+---------------+---------+-----------+----------+--------------+ FV Prox  Full                                                        +---------+---------------+---------+-----------+----------+--------------+ FV Mid   Full                                                        +---------+---------------+---------+-----------+----------+--------------+  FV DistalFull                                                        +---------+---------------+---------+-----------+----------+--------------+ PFV      Full                                                        +---------+---------------+---------+-----------+----------+--------------+ POP      Full           Yes      Yes                                 +---------+---------------+---------+-----------+----------+--------------+ PTV      Full                                                        +---------+---------------+---------+-----------+----------+--------------+ PERO     Full                                                        +---------+---------------+---------+-----------+----------+--------------+    +---------+---------------+---------+-----------+----------+--------------+ LEFT     CompressibilityPhasicitySpontaneityPropertiesThrombus Aging +---------+---------------+---------+-----------+----------+--------------+ CFV      Full           Yes      Yes                                 +---------+---------------+---------+-----------+----------+--------------+ SFJ      Full                                                        +---------+---------------+---------+-----------+----------+--------------+ FV Prox  Full                                                        +---------+---------------+---------+-----------+----------+--------------+ FV Mid   Full                                                        +---------+---------------+---------+-----------+----------+--------------+ FV DistalFull                                                        +---------+---------------+---------+-----------+----------+--------------+  PFV      Full                                                        +---------+---------------+---------+-----------+----------+--------------+ POP      Full           Yes      Yes                                 +---------+---------------+---------+-----------+----------+--------------+ PTV      Full                                                        +---------+---------------+---------+-----------+----------+--------------+ PERO     Full                                                        +---------+---------------+---------+-----------+----------+--------------+     Summary: RIGHT: - There is no evidence of deep vein thrombosis in the lower extremity.  - No cystic structure found in the popliteal fossa.  LEFT: - There is no evidence of deep vein thrombosis in the lower extremity.  - No cystic structure found in the popliteal fossa.  *See table(s) above for measurements and observations. Electronically signed  by [name] Moulds MD on 10/08/2023 at 2:18:06 PM.    Final    MR BRAIN W WO CONTRAST Result Date: 10/07/2023 CLINICAL DATA:  Brain/CNS neoplasm, assess treatment response 58 year old female with glioblastoma, IDH wild-type. Left temporal craniotomy, IMRT in 2022. Chemotherapy continues, repeat radiation therapy in February this year EXAM: MRI HEAD WITHOUT AND WITH CONTRAST TECHNIQUE: Multiplanar, multiecho pulse sequences of the brain and surrounding structures were obtained without and with intravenous contrast. CONTRAST:  8mL GADAVIST  GADOBUTROL  1 MMOL/ML IV SOLN COMPARISON:  MRI August 14, 2023. FINDINGS: Brain: Continued fading of restricted diffusion in the left corona radiata with similar restricted diffusion in the left periatrial region. There is mildly increased patchy and curvilinear enhancement anteriorly in the left inferior frontal gyrus with similar left periatrial/corona radiata patchy enhancement. No substantial change in areas of intrinsic T1 hyperintensity in these regions. Question mildly progressed T2/FLAIR hyperintensity also in the inferior left frontal gyrus. Additional T2/FLAIR hyperintensities in the left cerebral hemisphere are not substantially changed. No significant mass effect or midline shift. No evidence of acute hemorrhage or hydrocephalus. Similar anterior left temporal lobe resection site without abnormal enhancement in this region. Vascular: Major arterial flow voids are maintained skull base. Skull and upper cervical spine: Stable left-sided craniotomy. Otherwise, unremarkable bone marrow. Sinuses/Orbits: Clear sinuses.  No acute findings. Other: No mastoid effusions. IMPRESSION: Interval mildly increased enhancement and T2 hyperintensity in the left inferior frontal gyrus. Findings could represent continued evolution of treatment change or slow progression of disease. Recommend continued imaging follow-up. Electronically Signed   By: [name] M.D.   On: 10/07/2023  03:06   DG CHEST PORT 1 VIEW Result Date: 10/06/2023 CLINICAL DATA:  Dyspnea EXAM: PORTABLE CHEST 1 VIEW COMPARISON:  February 16, 2022 FINDINGS: Left lower lobe infiltrates and atelectasis with a small effusion could correlate with pneumonia IMPRESSION: Left lower lobe infiltrates and atelectasis with a small effusion could correlate with pneumonia. Electronically Signed   By: [name] M.D.   On: 10/06/2023 08:45   EEG adult Result Date: 10/05/2023 [name] Lack, MD     10/05/2023  9:29 PM Patient Name: [name] MRN: [id number] Epilepsy Attending: Arleene [name] Referring Physician/Provider: Laymond Priestly, NP Date: 10/05/2023 Duration: 22.25 mins Patient history: 58yo F with seizure history on vimpat  and keppra  at home, presented to OSH for 5-6 seizures. EEG to evaluate for seizure Level of alertness: Awake, asleep AEDs during EEG study: LEV, LCM Technical aspects: This EEG study was done with scalp electrodes positioned according to the 10-20 International system of electrode placement. Electrical activity was reviewed with band pass filter of 1-70Hz , sensitivity of 7 uV/mm, display speed of 3mm/sec with a 60Hz  notched filter applied as appropriate. EEG data were recorded continuously and digitally stored.  Video monitoring was available and reviewed as appropriate. Description: The posterior dominant rhythm consists of 8-9Hz  activity of moderate voltage (25-35 uV) seen predominantly in posterior head regions, asymmetric( left<right) and reactive to eye opening and eye closing. Sleep was characterized by vertex waves, sleep spindles (12 to 14 Hz), maximal frontocentral region. EEG showed continuous polymorphic rhythmic 3 to 6 Hz theta-delta slowing in left hemisphere. Additionally there was sharply contoured 3-6hz  theta-delta slowing left temporal region consistent with breach artifact. Sharp waves were noted in left temporal region, qasi-periodic at 1hz .  Hyperventilation and photic  stimulation were not performed.   ABNORMALITY - Sharp wave, left temporal region - Breach artifact, left temporal region - Continuous slow, left hemisphere - Background asymmetry, left<right IMPRESSION: This study showed evidence of epileptogenicity arising from left temporal region with increased risk of seizure recurrence. Additionally there was cortical dysfunction arising from left temporal region consistent with underlying craniotomy. No seizures were seen throughout the recording. If suspicion for ictal activity remains a concern, a prolonged study can be considered. [name]    Assessment/Plan 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)  Focal seizures ([name])  [name] is clinically recovered today from recent pneumonia, breakthrough seizure.  Seizures may have been provoked from the brewing infection.  MRI brain during the admission demonstrated modest increase in enhancing volume within recently re-irradiated left frontal lobe site.  Etiology is radiation treatment effect, avastin  washout, or tumor progression.    Recommended remaining off systemic therapy, avastin , and continuing imaging surveillance only at this time.  Hydrocortisone  may con't 10mg  daily.  For seizures, will con't Vimpat  200mg  BID.  Keppra  will also continue at higher dose 1500mg  BID based on recent seizure events.  All questions were answered. The patient knows to call the clinic with any problems, questions or concerns. No barriers to learning were detected.  We ask that CHARLEENE CALLEGARI return to clinic in 6 weeks following next brain MRI, or sooner as needed.  The total time spent in the encounter was 40 minutes and more than 50% was on counseling and review of test results   [name] Searles, MD Medical Director of Neuro-Oncology [facility] at [facility] 10/13/23 10:31 AM

## 2023-10-14 ENCOUNTER — Telehealth: Payer: Self-pay

## 2023-10-14 NOTE — Telephone Encounter (Signed)
 LVM for pt daughter regarding her FMLA forms being completed,faxed,and confirmation received. I emailed the pt daughter hardcopy as requested.No questions or concerns at this time.

## 2023-10-15 ENCOUNTER — Other Ambulatory Visit: Payer: Self-pay | Admitting: Internal Medicine

## 2023-10-15 DIAGNOSIS — C719 Malignant neoplasm of brain, unspecified: Secondary | ICD-10-CM | POA: Diagnosis not present

## 2023-10-15 DIAGNOSIS — F445 Conversion disorder with seizures or convulsions: Secondary | ICD-10-CM | POA: Diagnosis not present

## 2023-10-17 ENCOUNTER — Ambulatory Visit (HOSPITAL_COMMUNITY)

## 2023-10-17 ENCOUNTER — Other Ambulatory Visit: Payer: Self-pay | Admitting: Internal Medicine

## 2023-10-20 ENCOUNTER — Encounter: Payer: Self-pay | Admitting: Internal Medicine

## 2023-10-23 ENCOUNTER — Other Ambulatory Visit: Payer: Self-pay | Admitting: Internal Medicine

## 2023-11-04 ENCOUNTER — Other Ambulatory Visit: Payer: Self-pay | Admitting: Internal Medicine

## 2023-11-06 ENCOUNTER — Telehealth: Payer: Self-pay | Admitting: *Deleted

## 2023-11-06 ENCOUNTER — Other Ambulatory Visit: Payer: Self-pay | Admitting: Internal Medicine

## 2023-11-06 NOTE — Telephone Encounter (Signed)
 PC to patient's daughter, Kia - no answer, left VM.  Informed Kia patient has MRI scheduled on 11/21/23 at [facility] at 10:30, she is to arrive at 10:00.  She has a F/U appointment with Dr Buckley on 11/24/23 at 10:30.  Instructed Kia to contact Central Scheduling if MRI appointment needs to be changed, phone number given.  Also instructed to contact this office with any questions/concerns.

## 2023-11-07 ENCOUNTER — Other Ambulatory Visit: Payer: Self-pay | Admitting: Internal Medicine

## 2023-11-10 ENCOUNTER — Ambulatory Visit: Admitting: Physical Therapy

## 2023-11-10 ENCOUNTER — Encounter: Payer: Self-pay | Admitting: Internal Medicine

## 2023-11-10 ENCOUNTER — Ambulatory Visit: Attending: Internal Medicine | Admitting: Occupational Therapy

## 2023-11-10 ENCOUNTER — Other Ambulatory Visit: Payer: Self-pay

## 2023-11-10 ENCOUNTER — Ambulatory Visit

## 2023-11-10 DIAGNOSIS — R29818 Other symptoms and signs involving the nervous system: Secondary | ICD-10-CM | POA: Insufficient documentation

## 2023-11-10 DIAGNOSIS — M6281 Muscle weakness (generalized): Secondary | ICD-10-CM | POA: Diagnosis not present

## 2023-11-10 DIAGNOSIS — R2689 Other abnormalities of gait and mobility: Secondary | ICD-10-CM | POA: Diagnosis not present

## 2023-11-10 DIAGNOSIS — C719 Malignant neoplasm of brain, unspecified: Secondary | ICD-10-CM | POA: Insufficient documentation

## 2023-11-10 DIAGNOSIS — R208 Other disturbances of skin sensation: Secondary | ICD-10-CM | POA: Insufficient documentation

## 2023-11-10 DIAGNOSIS — R2681 Unsteadiness on feet: Secondary | ICD-10-CM | POA: Diagnosis not present

## 2023-11-10 DIAGNOSIS — G40309 Generalized idiopathic epilepsy and epileptic syndromes, not intractable, without status epilepticus: Secondary | ICD-10-CM | POA: Diagnosis not present

## 2023-11-10 DIAGNOSIS — R262 Difficulty in walking, not elsewhere classified: Secondary | ICD-10-CM | POA: Diagnosis not present

## 2023-11-10 DIAGNOSIS — R278 Other lack of coordination: Secondary | ICD-10-CM | POA: Insufficient documentation

## 2023-11-10 DIAGNOSIS — I69351 Hemiplegia and hemiparesis following cerebral infarction affecting right dominant side: Secondary | ICD-10-CM | POA: Diagnosis not present

## 2023-11-10 NOTE — Therapy (Signed)
 OUTPATIENT OCCUPATIONAL THERAPY NEURO EVALUATION  Patient Name: [name] MRN: [id number] DOB:[birth year], 58 y.o., female Today's Date: 11/10/2023  PCP: Merna Huxley, NP REFERRING PROVIDER:   Caleen Burgess BROCKS, MD    END OF SESSION:  OT End of Session - 11/10/23 1128     Visit Number 1    Number of Visits 13    Date for OT Re-Evaluation 01/09/24    Authorization Type BCBS [id number]    OT Start Time 1018    OT Stop Time 1102    OT Time Calculation (min) 44 min          Past Medical History:  Diagnosis Date   Bell's palsy    left side of face- notied in smile and eyes, if very tired   Brain cancer ([name])    Complication of anesthesia    N/V   Essential hypertension    External hemorrhoids    History of blood transfusion    post op Hemorroids   History of pneumonia    Hyperlipidemia    Hyperthyroidism    hx 58 years old- oral meds   Iron deficiency anemia    Migraine headache    Pneumonia    x 2 - years ago   PONV (postoperative nausea and vomiting)    PPD positive    Pulmonary nodules    not seen on plain cxr, first detected 1/07 re ct 10/08 pos IPPD > 15 mm 12/09 FOB/lavage 04/20/08 neg afb smear   PVC's (premature ventricular contractions)    Seizure ([name]) 11/15/2020   Sleep apnea    refused CPAP- mouth piece recommended   Tuberculosis    in her late 30's   Past Surgical History:  Procedure Laterality Date   ABDOMINAL HYSTERECTOMY     APPLICATION OF CRANIAL NAVIGATION N/A 11/21/2020   Procedure: APPLICATION OF CRANIAL NAVIGATION;  Surgeon: Cheryle Debby LABOR, MD;  Location: [facility] OR;  Service: Neurosurgery;  Laterality: N/A;   BREAST BIOPSY Right 2014   BREAST BIOPSY Left 10/29/2019   CHOLECYSTECTOMY  2014   COLONOSCOPY  05/04/2009   prolapsing external hemorrhoids   CRANIOTOMY Left 11/21/2020   Procedure: Left craniotomy for tumor resection;  Surgeon: Cheryle Debby LABOR, MD;  Location: [facility] OR;  Service: Neurosurgery;  Laterality: Left;   ENDOMETRIAL  ABLATION     HEMORRHOID SURGERY     x 3   TUBAL LIGATION     Patient Active Problem List   Diagnosis Date Noted   Carcinoma of head of pancreas ([name]) 10/05/2023   Seizure disorder ([name]) 10/05/2023   Generalized seizure disorder ([name]) 10/05/2023   Neutropenia ([name]) 10/18/2021   Genetic testing 08/24/2021   Family history of colon cancer 08/03/2021   Pancreatic mass 07/31/2021   S/P craniotomy 11/21/2020   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) 11/21/2020   Focal seizures ([name]) 11/15/2020   OSA (obstructive sleep apnea) 06/13/2017   Hypothyroid 06/26/2015   Brachial neuritis 03/29/2015   Rotator cuff tear 12/21/2014   Abdominal pain, right upper quadrant 03/14/2014   Leg cramps 07/15/2013   Acquired anal stenosis 06/18/2011   Chronic constipation 06/18/2011   PVC's (premature ventricular contractions) 05/23/2011   Headache, migraine, intractable 05/09/2011   Asthma exacerbation, allergic 07/31/2010   POSITIVE PPD 05/04/2008   Hyperlipidemia 02/05/2008   Essential hypertension 02/05/2008   Pulmonary nodules 02/05/2008   Chronic cough 02/05/2008    ONSET DATE: referral date 10/08/23  REFERRING DIAG: G40.309 (ICD-10-CM) - Generalized seizure disorder ([name]) C71.9 (  ICD-10-CM) - 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)  THERAPY DIAG:  Hemiplegia and hemiparesis following cerebral infarction affecting right dominant side ([name])  Other symptoms and signs involving the nervous system  Muscle weakness (generalized)  Other disturbances of skin sensation  Other lack of coordination  Rationale for Evaluation and Treatment: Rehabilitation  SUBJECTIVE:   SUBJECTIVE STATEMENT: Pt diagnosed with brain cancer in 2022.  Pt's daughter reports stroke in March of this year with worsening of speech and R sided weakness.  Pt is still able to do majority of bathing and dressing, but does need help with clothing fasteners.  Pt will occasionally  eat with hands due to decreased coordination and ability to manipulate eating utensils.  Pt enjoyed working and helping out at [employer].    Pt had a fall Tuesday and a small seizure the same day.  Scheduled for MRI 7/18 and appt with [facility] on 7/21. Pt accompanied by: self and family member (daughter and sister)  PERTINENT HISTORY: PMH includes: seizures, Bell's palsy, brain cancer s/p crani for L temporal mass resection in 2022, CVA with R-sided weakness and aphasia, HTN, HLD, hyperthyroidism, PVCs, OSA, TB.  Ad[facility] to [facility]1 with reports of 3 seizures at home. EEG showed epileptogenicity arising from left temporal region with increased risk of seizure recurrence. MRI shows mildly increased enhancement and T2 hyperintensity in the left inferior frontal gyrus.  PRECAUTIONS: Fall  WEIGHT BEARING RESTRICTIONS: No  PAIN:  Are you having pain? No  FALLS: Has patient fallen in last 6 months? Yes. Number of falls 1 this past Tues  LIVING ENVIRONMENT: Lives with: lives with their family (daughter lives with pt and sister lives within 20 mins from their home) Lives in: House/apartment Stairs: Yes: External: 3 steps; can reach both Has following equipment at home: Walker - 2 wheeled, shower chair, bed side commode, and supposed to be getting grab bars soon  PLOF: Independent, Independent with basic ADLs, and Vocation/Vocational requirements: worked at [employer] in [place] [employer] and would help [employer] with transportation and cooking for the fish fry  PATIENT GOALS: want to take care of self  OBJECTIVE:  Note: Objective measures were completed at Evaluation unless otherwise noted.  HAND DOMINANCE: Right  ADLs: Overall ADLs: gets tired really easily Transfers/ambulation related to ADLs: walking without AD Eating: difficulty with use of utensils, will frequently use hands to eat Grooming: Mod I UB Dressing: occasional difficulty with clothing fasteners LB Dressing: Mod I Toileting: Mod  I Bathing: intermittent assistance for thoroughness , gets Tub Shower transfers: sometimes a shower and sometimes a wash up at the sink, occasional assist to step out of tub/shower Equipment: Shower seat with back and bed side commode  IADLs: Meal Prep: occasionally pt will fix something for breakfast, no issues with forgetting steps or safety issues; dtr doing most of the cooking Community mobility: not cleared to drive due to seizures Medication management: dtr will fill pill box and check behind to make sure she takes them Handwriting: not assessed due to decreased manipulation of pegs with 9 hole peg test  MOBILITY STATUS: Hx of falls and dragging RLE with fatigue  POSTURE COMMENTS:  No Significant postural limitations  ACTIVITY TOLERANCE: Activity tolerance: diminished  FUNCTIONAL OUTCOME MEASURES: PSFS: 4.8   UPPER EXTREMITY ROM:    Active ROM Right eval Left eval  Shoulder flexion 104 (116 PROM) WFL  Shoulder abduction 65   Shoulder adduction    Shoulder extension    Shoulder internal rotation Completing  50-60% of movement   Shoulder external rotation WFL (requiring increased time)   Elbow flexion    Elbow extension    Wrist flexion    Wrist extension    Wrist ulnar deviation    Wrist radial deviation    Wrist pronation    Wrist supination    (Blank rows = not tested)  UPPER EXTREMITY MMT:   Did not test MMT as the pt already exhibits decreased ROM.   HAND FUNCTION: TBD - dtr reports decreased grip strength on R  COORDINATION: 9 Hole Peg test: Right: able to place 5 pegs in 2 min time limit sec; Left: 43.29 sec - requiring cue to remove pegs after placement on L  SENSATION: Light touch: Impaired on right   EDEMA: mild edema in R hand compared to L  COGNITION: Overall cognitive status: History of cognitive impairments - at baseline; difficult to assess due to expressive > receptive aphasia  VISION ASSESSMENT: Eye alignment: Impaired:  Gaze  preference/alignment: gaze left Tracking/Visual pursuits: Decreased smoothness with horizontal tracking, Decreased smoothness of eye movement to Right superior field, Decreased smoothness of eye movement to Right inferior field, and Requires cues, head turns, or add eye shifts to track Saccades: impaired and pt fatigued quickly with testing Visual Fields: Right visual field deficits  Patient has difficulty with following activities due to following visual impairments: reading  PERCEPTION: Impaired: Inattention/neglect: does not attend to right visual field and Impaired- to be further tested in functional concext  PRAXIS: Impaired: Motor planning  OBSERVATIONS: pt with support family - dtr and sister present during evaluation.  Pt positioned towards L in apparent R visual field deficit/inattention.  Pt demonstrating expressive > receptive aphasia impacting communication but with increased time and min cues pt able to answer many questions.                                                                                                                              TREATMENT DATE:  11/10/23  Encouraged pt's family to find motivating tasks to increase participation and motivation as they expressed concerns about pt's motivation.  Encouraged family to problem solve how to increase pt participation in leisure tasks by helping with small tasks at church or increased engagement, with supervision, during meal prep and/or other household tasks.    PATIENT EDUCATION: Education details: Educated on role and purpose of OT as well as potential interventions and goals for therapy based on initial evaluation findings. Person educated: Patient and Child(ren), sister Education method: Explanation Education comprehension: verbalized understanding and needs further education  HOME EXERCISE PROGRAM: TBD   GOALS: Goals reviewed with patient? Yes  SHORT TERM GOALS: Target date: 12/12/23  Pt will be  independent with ROM and coordination HEP with use of visual handouts. Baseline: new to OPOT Goal status: INITIAL  2.  Pt will verbalize understanding of energy conservation techniques. Baseline: diminished activity tolerance/engagement in previous IADLs Goal status: INITIAL  3.  Pt will demonstrate improved functional use of dominant RUE to place 9 pegs of 9 hole peg test in 2 min time limit. Baseline: placed 5 pegs in 2 min Goal status: INITIAL  4.  Pt will verbalize understanding with safety considerations and A/E needs to ensure safety d/t decreased sensation. Baseline: decreased sensation dominant RUE Goal status: INITIAL   LONG TERM GOALS: Target date: 01/09/24  Pt will demonstrate improved ease with feeding with utensils as evidenced by decreasing PPT#2 (self feeding) by 5 seconds or less than 30 secs. Baseline: TBD Goal status: INITIAL  2.  Pt will demonstrate improved fine motor coordination for ADLs (clothing fasteners) as evidenced by completing 9 hole peg test (place and remove) with RUE in 2 mins time limit Baseline: placing 5 pegs in 2 mins time limit Goal status: INITIAL  3.  Pt will demonstrate 30# grip strength (functional grip) or improvement by 8# to be able to maintain grasp as needed to open drink containers. Baseline: TBD Goal status: INITIAL  4.  Pt will complete table top scanning activity to increase attention to R environment with no cues for technique. Baseline: R inattention Goal status: INITIAL  5.  Pt will demonstrate ability to complete 2 step tasks with 1 or fewer cues for recall/sequencing. Baseline: increased time for one step commands, difficulty with 2 step tasks  Goal status: INITIAL  6.  Patient will report at least two-point increase in average PSFS score or at least three-point increase in a single activity score indicating functionally significant improvement given minimum detectable change. Baseline: 4.8 Goal status:  INITIAL  ASSESSMENT:  CLINICAL IMPRESSION: Patient is a 57 y.o. female who was seen today for occupational therapy evaluation for impairments s/p recent hospitalization due to seizures.  Pt with h/o glioblastoma, seizure disorder, and R sided weakness and impairments in coordination and strength of dominant RUE.  Pt with decreased awareness, decreased sequencing, and motor planning impacting safety with ADLs and IADLs.  Pt currently lives with adult daughter and family members who can assist while daughter works. Pt will benefit from skilled occupational therapy services to address strength and coordination, ROM, pain management, altered sensation, balance, GM/FM control, cognition, safety awareness, introduction of compensatory strategies/AE prn, visual-perception, and implementation of an HEP to improve participation and safety during ADLs and IADLs.    PERFORMANCE DEFICITS: in functional skills including ADLs, IADLs, coordination, proprioception, sensation, edema, tone, ROM, strength, pain, Fine motor control, Gross motor control, body mechanics, endurance, decreased knowledge of precautions, decreased knowledge of use of DME, vision, and UE functional use, cognitive skills including memory, problem solving, safety awareness, and sequencing, and psychosocial skills including coping strategies, environmental adaptation, and routines and behaviors.   IMPAIRMENTS: are limiting patient from ADLs, IADLs, work, and leisure.   CO-MORBIDITIES: may have co-morbidities  that affects occupational performance. Patient will benefit from skilled OT to address above impairments and improve overall function.  MODIFICATION OR ASSISTANCE TO COMPLETE EVALUATION: Min-Moderate modification of tasks or assist with assess necessary to complete an evaluation.  OT OCCUPATIONAL PROFILE AND HISTORY: Detailed assessment: Review of records and additional review of physical, cognitive, psychosocial history related to current  functional performance.  CLINICAL DECISION MAKING: Moderate - several treatment options, min-mod task modification necessary  REHAB POTENTIAL: Good  EVALUATION COMPLEXITY: Moderate    PLAN:  OT FREQUENCY: 1-2x/week  OT DURATION: 6 weeks (asking 8 for scheduling needs)  PLANNED INTERVENTIONS: 02831 OT Re-evaluation, 97535 self care/ADL training, 02889 therapeutic exercise, 97530 therapeutic activity, [phone]  neuromuscular re-education, 97140 manual therapy, [id number] ultrasound, 02967 electrical stimulation (manual), 97750 Physical Performance Testing, [phone] Orthotic Initial, passive range of motion, functional mobility training, visual/perceptual remediation/compensation, psychosocial skills training, energy conservation, coping strategies training, patient/family education, and DME and/or AE instructions  RECOMMENDED OTHER SERVICES: SLP  CONSULTED AND AGREED WITH PLAN OF CARE: Patient and family member/caregiver  PLAN FOR NEXT SESSION:  Assess grip strength, time PPT #2 (self-feeding), attempt box and blocks? - functional use of RUE  initiate shoulder ROM HEP, coordination HEP (medbridge handouts), engaging in 2 step commands, cog/motor dual tasking,    Shamiya Demeritt, OTR/L 11/10/2023, 11:32 AM   [facility] [address] [place], [place] [address] Phone # [phone] Fax # [phone]

## 2023-11-10 NOTE — Therapy (Signed)
 OUTPATIENT PHYSICAL THERAPY NEURO EVALUATION   Patient Name: [name] MRN: [id number] DOB:1965-09-03, 58 y.o., female Today's Date: 11/10/2023   PCP: Merna Huxley, NP REFERRING PROVIDER: Caleen Burgess BROCKS, MD  END OF SESSION:  PT End of Session - 11/10/23 1024     Visit Number 1    Number of Visits 12    Date for PT Re-Evaluation 12/22/23    Authorization Type [employer]    PT Start Time 1100    PT Stop Time 1145    PT Time Calculation (min) 45 min          Past Medical History:  Diagnosis Date   Bell's palsy    left side of face- notied in smile and eyes, if very tired   Brain cancer ([name])    Complication of anesthesia    N/V   Essential hypertension    External hemorrhoids    History of blood transfusion    post op Hemorroids   History of pneumonia    Hyperlipidemia    Hyperthyroidism    hx 58 years old- oral meds   Iron deficiency anemia    Migraine headache    Pneumonia    x 2 - years ago   PONV (postoperative nausea and vomiting)    PPD positive    Pulmonary nodules    not seen on plain cxr, first detected 1/07 re ct 10/08 pos IPPD > 15 mm 12/09 FOB/lavage 04/20/08 neg afb smear   PVC's (premature ventricular contractions)    Seizure ([name]) 11/15/2020   Sleep apnea    refused CPAP- mouth piece recommended   Tuberculosis    in her late 30's   Past Surgical History:  Procedure Laterality Date   ABDOMINAL HYSTERECTOMY     APPLICATION OF CRANIAL NAVIGATION N/A 11/21/2020   Procedure: APPLICATION OF CRANIAL NAVIGATION;  Surgeon: Cheryle Debby LABOR, MD;  Location: [facility] OR;  Service: Neurosurgery;  Laterality: N/A;   BREAST BIOPSY Right 2014   BREAST BIOPSY Left 10/29/2019   CHOLECYSTECTOMY  2014   COLONOSCOPY  05/04/2009   prolapsing external hemorrhoids   CRANIOTOMY Left 11/21/2020   Procedure: Left craniotomy for tumor resection;  Surgeon: Cheryle Debby LABOR, MD;  Location: [facility] OR;  Service: Neurosurgery;  Laterality: Left;    ENDOMETRIAL ABLATION     HEMORRHOID SURGERY     x 3   TUBAL LIGATION     Patient Active Problem List   Diagnosis Date Noted   Carcinoma of head of pancreas ([name]) 10/05/2023   Seizure disorder ([name]) 10/05/2023   Generalized seizure disorder ([name]) 10/05/2023   Neutropenia ([name]) 10/18/2021   Genetic testing 08/24/2021   Family history of colon cancer 08/03/2021   Pancreatic mass 07/31/2021   S/P craniotomy 11/21/2020   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) 11/21/2020   Focal seizures ([name]) 11/15/2020   OSA (obstructive sleep apnea) 06/13/2017   Hypothyroid 06/26/2015   Brachial neuritis 03/29/2015   Rotator cuff tear 12/21/2014   Abdominal pain, right upper quadrant 03/14/2014   Leg cramps 07/15/2013   Acquired anal stenosis 06/18/2011   Chronic constipation 06/18/2011   PVC's (premature ventricular contractions) 05/23/2011   Headache, migraine, intractable 05/09/2011   Asthma exacerbation, allergic 07/31/2010   POSITIVE PPD 05/04/2008   Hyperlipidemia 02/05/2008   Essential hypertension 02/05/2008   Pulmonary nodules 02/05/2008   Chronic cough 02/05/2008    ONSET DATE: March 2025  REFERRING DIAG: G40.309 (ICD-10-CM) - Generalized seizure disorder ([name]) C71.9 (ICD-10-CM) -  Glioblastoma with isocitrate dehydrogenase gene wildtype  THERAPY DIAG:  Difficulty in walking, not elsewhere classified  Muscle weakness (generalized)  Other abnormalities of gait and mobility  Unsteadiness on feet  Rationale for Evaluation and Treatment: Rehabilitation  SUBJECTIVE:                                                                                                                                                                                             SUBJECTIVE STATEMENT: Hx of glioblastoma and seizure disorder with onset of increased difficulty with mobility, balance, and risk for falls requiring increased need for caregiver support since March 2025.  Has  not been using AD and has been independent household levels and HHA/SBA when ambulating outdoors.  Pt accompanied by: family member  PERTINENT HISTORY: glioblastoma s/p craniotomy in 2022. Seizure disorder  PAIN:  Are you having pain? No obvious pain responses appreciated and denies  PRECAUTIONS: Fall  RED FLAGS: None   WEIGHT BEARING RESTRICTIONS: No  FALLS: Has patient fallen in last 6 months? Yes. Number of falls 2 when experienced seizure  LIVING ENVIRONMENT: Lives with: lives with their family and lives with their daughter Lives in: House/apartment Stairs: 3 STE w/ BHR,  Has following equipment at home: reports no use of AD previously  PLOF: was doing most on her own until March 2025, was active/involved in church activities  PATIENT GOALS: improve mobility, balance, activity  OBJECTIVE:  Note: Objective measures were completed at Evaluation unless otherwise noted.  DIAGNOSTIC FINDINGS:  IMPRESSION: Interval mildly increased enhancement and T2 hyperintensity in the left inferior frontal gyrus. Findings could represent continued evolution of treatment change or slow progression of disease. Recommend continued imaging follow-up.  COGNITION: Overall cognitive status: History of cognitive impairments - at baseline   SENSATION: Not tested  COORDINATION: Deficits to rapid alternating movement RUE/RLE Heel to shin impaired RLE   EDEMA:  None noted  MUSCLE TONE: hypotonia RLE?     POSTURE: No Significant postural limitations  LOWER EXTREMITY ROM:     Active  Right Eval Left Eval  Hip flexion    Hip extension    Hip abduction    Hip adduction    Hip internal rotation    Hip external rotation    Knee flexion 120 120  Knee extension 0 0  Ankle dorsiflexion 5 15  Ankle plantarflexion    Ankle inversion    Ankle eversion     (Blank rows = not tested)  LOWER EXTREMITY MMT:    MMT Right Eval Left Eval  Hip flexion 3 5  Hip extension    Hip  abduction 3-  4  Hip adduction 3- 4  Hip internal rotation    Hip external rotation    Knee flexion 3+ 5  Knee extension 3+ 5  Ankle dorsiflexion 3- 5  Ankle plantarflexion 2+ 4  Ankle inversion    Ankle eversion    (Blank rows = not tested)  BED MOBILITY:  Not tested  TRANSFERS: Sit to stand modified indep Chair to chair: modified indep.    CURB:  Findings: CGA  STAIRS: CGA w/ BHR GAIT: Findings: Comments: decreased stride, right foot drop/drag, decreased foot clearance, decreased speed  FUNCTIONAL TESTS:  5 times sit to stand: 20 sec w/ UE support Timed up and go (TUG): 30 sec 2 minute walk test: TBD Berg Balance Test: 40/56 Romberg: WFL 10 meter walk: 33.84 sec = 0.97 ft/sec   [facility] PT Assessment - 11/10/23 0001       Standardized Balance Assessment   Standardized Balance Assessment Berg Balance Test;10 meter walk test;Five Times Sit to Stand;Timed Up and Go Test    Five times sit to stand comments  20 sec w/ UE support      Berg Balance Test   Sit to Stand Able to stand  independently using hands    Standing Unsupported Able to stand safely 2 minutes    Sitting with Back Unsupported but Feet Supported on Floor or Stool Able to sit safely and securely 2 minutes    Stand to Sit Sits safely with minimal use of hands    Transfers Able to transfer safely, minor use of hands    Standing Unsupported with Eyes Closed Able to stand 10 seconds safely    Standing Unsupported with Feet Together Able to place feet together independently and stand 1 minute safely    From Standing, Reach Forward with Outstretched Arm Can reach forward >12 cm safely (5)    From Standing Position, Pick up Object from Floor Able to pick up shoe, needs supervision    From Standing Position, Turn to Look Behind Over each Shoulder Turn sideways only but maintains balance    Turn 360 Degrees Needs close supervision or verbal cueing    Standing Unsupported, Alternately Place Feet on Step/Stool  Able to complete >2 steps/needs minimal assist    Standing Unsupported, One Foot in Front Able to take small step independently and hold 30 seconds    Standing on One Leg Tries to lift leg/unable to hold 3 seconds but remains standing independently    Total Score 40      Timed Up and Go Test   Normal TUG (seconds) 30                                                                                                                                       TREATMENT DATE: 11/10/23    PATIENT EDUCATION: Education details: assessment details, rationale of PT intervention and HEP initiation Person educated: Patient and  Child(ren) Education method: Explanation and Handouts Education comprehension: verbalized understanding  HOME EXERCISE PROGRAM: Access Code: [email or web address] URL: [email or web address] Date: 11/10/2023 Prepared by: Kelly Rebecca Cairns  Exercises - Supine Ankle Pumps  - 1 x daily - 7 x weekly - 3 sets - 10 reps - Seated Toe Raise  - 1 x daily - 7 x weekly - 3 sets - 10 reps - Seated Long Arc Quad  - 1 x daily - 7 x weekly - 3 sets - 10 reps - 2 sec hold - Sit to Stand with Armchair  - 1 x daily - 7 x weekly - 3 sets - 5 reps  GOALS: Goals reviewed with patient? Yes  SHORT TERM GOALS: Target date: 12/01/2023    Patient will perform HEP with family/caregiver supervision for improved strength, balance, transfers, and gait  Baseline: Goal status: INITIAL  2.  Patient will achieve 15 seconds for TUG test to manifest reduced risk for falls Baseline: 30 sec w/out AD Goal status: INITIAL  3.  Ambulate stairs w/ BHR and supervision to improve safety with entering/exiting home Baseline: BHR and CGA Goal status: INITIAL    LONG TERM GOALS: Target date: 12/22/2023    Patient will be independent in HEP to improve functional outcomes Baseline:  Goal status: INITIAL  2.  Demo improved balance and lower risk for falls per score 48/56 Berg Balance Test Baseline: 40/56 Goal  status: INITIAL  3.  Patient will improve gait velocity to at least 1.8 ft/sec for improved gait efficiency and safety Baseline: 0.97 ft/sec Goal status: INITIAL  4.  Demo improved BLE strength and reduced risk for falls per time 15 sec 5xSTS test Baseline: 20 sec w/ UE support Goal status: INITIAL  5.  Ambulate outdoor surfaces w/ supervision using least restrictive AD to improve safety with mobility Baseline: CGA/HHA Goal status: INITIAL    ASSESSMENT:  CLINICAL IMPRESSION: Patient is a 58 y.o. lady who was seen today for physical therapy evaluation and treatment for G40.309 (ICD-10-CM) - Generalized seizure disorder ([name]) C71.9 (ICD-10-CM) - Glioblastoma with isocitrate dehydrogenase gene wildtype.  Exhibits general right side weakness and impairments to coordination, strength, power affecting functional mobility.  Exhibits high risk for falls per outcome measures and gait deviations w/ decreased right foot clearance in swing and foot drop/slap in initial contact.  Decreased awareness is also noted requiring global cues for sequence and execution of movements.  Pt would benefit from ongoing PT services to provide relevant intervention strategies, compensatory and adaptive strategies to improve mobility and reduce risk for falls.    OBJECTIVE IMPAIRMENTS: Abnormal gait, decreased activity tolerance, decreased balance, decreased coordination, decreased endurance, decreased knowledge of use of DME, decreased mobility, difficulty walking, decreased ROM, and decreased strength.   ACTIVITY LIMITATIONS: carrying, lifting, bending, squatting, stairs, transfers, reach over head, and locomotion level  PARTICIPATION LIMITATIONS: meal prep, cleaning, laundry, interpersonal relationship, community activity, occupation, and church  PERSONAL FACTORS: Age, Behavior pattern, Time since onset of injury/illness/exacerbation, and 1-2 comorbidities: PMH are also affecting patient's functional outcome.    REHAB POTENTIAL: Good  CLINICAL DECISION MAKING: Unstable/unpredictable  EVALUATION COMPLEXITY: High  PLAN:  PT FREQUENCY: 1-2x/week  PT DURATION: 6 weeks  PLANNED INTERVENTIONS: 97750- Physical Performance Testing, 97110-Therapeutic exercises, 97530- Therapeutic activity, [id number]- Neuromuscular re-education, 97535- Self Care, [id number]- Manual therapy, [id number]- Gait training, [id number]- Orthotic Initial, [id number]- Orthotic/Prosthetic subsequent, [phone]- Canalith repositioning, and [phone]- Aquatic Therapy  PLAN FOR NEXT SESSION: HEP review, foot-up/AFO if shoes appropriate, trial of various AD   12:40  PM, 11/10/23 M. Kelly Markee Matera, PT, DPT Physical Therapist- [place] Office Number: [phone]

## 2023-11-14 DIAGNOSIS — F445 Conversion disorder with seizures or convulsions: Secondary | ICD-10-CM | POA: Diagnosis not present

## 2023-11-14 DIAGNOSIS — C719 Malignant neoplasm of brain, unspecified: Secondary | ICD-10-CM | POA: Diagnosis not present

## 2023-11-19 ENCOUNTER — Ambulatory Visit

## 2023-11-19 ENCOUNTER — Other Ambulatory Visit: Payer: Self-pay | Admitting: Internal Medicine

## 2023-11-19 DIAGNOSIS — C719 Malignant neoplasm of brain, unspecified: Secondary | ICD-10-CM

## 2023-11-21 ENCOUNTER — Other Ambulatory Visit: Payer: Self-pay

## 2023-11-21 ENCOUNTER — Ambulatory Visit (HOSPITAL_COMMUNITY)
Admission: RE | Admit: 2023-11-21 | Discharge: 2023-11-21 | Disposition: A | Discharge status: Home / Self Care | Source: Ambulatory Visit | Attending: Internal Medicine | Admitting: Internal Medicine

## 2023-11-21 DIAGNOSIS — C719 Malignant neoplasm of brain, unspecified: Secondary | ICD-10-CM | POA: Insufficient documentation

## 2023-11-21 DIAGNOSIS — J341 Cyst and mucocele of nose and nasal sinus: Secondary | ICD-10-CM | POA: Diagnosis not present

## 2023-11-21 MED ORDER — GADOBUTROL 1 MMOL/ML IV SOLN
8.0000 mL | Freq: Once | INTRAVENOUS | Status: AC | PRN
  Administered 2023-11-21: 8 mL via INTRAVENOUS

## 2023-11-24 ENCOUNTER — Encounter: Payer: Self-pay | Admitting: Nurse Practitioner

## 2023-11-24 ENCOUNTER — Inpatient Hospital Stay (HOSPITAL_BASED_OUTPATIENT_CLINIC_OR_DEPARTMENT_OTHER): Admitting: Nurse Practitioner

## 2023-11-24 ENCOUNTER — Inpatient Hospital Stay: Attending: Internal Medicine | Admitting: Internal Medicine

## 2023-11-24 VITALS — BP 135/95 | HR 67 | Temp 97.7°F | Resp 20 | Wt 170.3 lb

## 2023-11-24 DIAGNOSIS — Z801 Family history of malignant neoplasm of trachea, bronchus and lung: Secondary | ICD-10-CM | POA: Insufficient documentation

## 2023-11-24 DIAGNOSIS — M62838 Other muscle spasm: Secondary | ICD-10-CM | POA: Diagnosis not present

## 2023-11-24 DIAGNOSIS — Z800 Family history of malignant neoplasm of digestive organs: Secondary | ICD-10-CM | POA: Insufficient documentation

## 2023-11-24 DIAGNOSIS — R519 Headache, unspecified: Secondary | ICD-10-CM | POA: Diagnosis not present

## 2023-11-24 DIAGNOSIS — R531 Weakness: Secondary | ICD-10-CM | POA: Insufficient documentation

## 2023-11-24 DIAGNOSIS — Z79899 Other long term (current) drug therapy: Secondary | ICD-10-CM | POA: Diagnosis not present

## 2023-11-24 DIAGNOSIS — C712 Malignant neoplasm of temporal lobe: Secondary | ICD-10-CM | POA: Diagnosis not present

## 2023-11-24 DIAGNOSIS — C719 Malignant neoplasm of brain, unspecified: Secondary | ICD-10-CM

## 2023-11-24 DIAGNOSIS — Z923 Personal history of irradiation: Secondary | ICD-10-CM | POA: Insufficient documentation

## 2023-11-24 DIAGNOSIS — R569 Unspecified convulsions: Secondary | ICD-10-CM | POA: Diagnosis not present

## 2023-11-24 DIAGNOSIS — Z7189 Other specified counseling: Secondary | ICD-10-CM | POA: Diagnosis not present

## 2023-11-24 DIAGNOSIS — G893 Neoplasm related pain (acute) (chronic): Secondary | ICD-10-CM

## 2023-11-24 DIAGNOSIS — Z8673 Personal history of transient ischemic attack (TIA), and cerebral infarction without residual deficits: Secondary | ICD-10-CM | POA: Insufficient documentation

## 2023-11-24 DIAGNOSIS — F32A Depression, unspecified: Secondary | ICD-10-CM | POA: Diagnosis not present

## 2023-11-24 DIAGNOSIS — Z9221 Personal history of antineoplastic chemotherapy: Secondary | ICD-10-CM | POA: Insufficient documentation

## 2023-11-24 DIAGNOSIS — G40909 Epilepsy, unspecified, not intractable, without status epilepticus: Secondary | ICD-10-CM | POA: Diagnosis not present

## 2023-11-24 DIAGNOSIS — R530 Neoplastic (malignant) related fatigue: Secondary | ICD-10-CM

## 2023-11-24 DIAGNOSIS — Z515 Encounter for palliative care: Secondary | ICD-10-CM

## 2023-11-24 NOTE — Progress Notes (Signed)
 [facility] [address]  [place], [place] [address] [phone]   Interval Evaluation  Date of Service: 11/24/23 Patient Name: [name] Patient MRN: [id number] Patient DOB: [birth year] Provider: Arthea MARLA Manns, MD  Identifying Statement:  [name] is a 58 y.o. 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)  11/21/2020 Surgery   Left temporal craniotomy, resection with Dr. Cheryle.  Path is glioblastoma, IDHwt   12/27/2020 - 02/07/2021 Radiation Therapy   IMRT and concurrent Temodar  [name])   03/12/2021 - 08/26/2021 Chemotherapy   Completes 5 cycles of adjuvant 5-day Temodar    08/27/2021 Progression   POD #1   09/06/2021 -  Chemotherapy   Initiates second line therapy; CCNU 90mg /m2 PO q6 weeks and avastin  10mg /kg IV q2 weeks   12/28/2021 Progression   POD #2   01/17/2022 -  Chemotherapy   Third line therapy with Irinotecan  and Avastin  q2 weeks   11/21/2022 -  Chemotherapy   Transitions to single agent Avastin    05/21/2023 Progression   POD #3   06/18/2023 - 07/01/2023 Radiation Therapy   Re-irradiation 35/10 with concurrent avastin  10mg /kg IV q2 weeks [name])     Biomarkers:  MGMT Methylated.  IDH 1/2 Wild type.  EGFR Unknown  TERT Unknown   Interval History: [name] presents today for follow up after recent MRI brain study.  Patient and family report ongoing progression of fatigue and increased sleep volume. She otherwise continues to have difficulty communicating/speaking.  Her gait is less independent and more of a shuffle, though not needing assistance.  No further seizure events.  She continues on vimpat  to 200mg  twice per day, Keppra  1500mg  BID.  Hydrocortisone  remains at 10mg  daily.    H+P (11/25/20) Patient presented to medical attention this past month with first ever seizure.  She describes episode  of sudden onset disconnection with tongue biting and urination, followed by period of confusion.  CNS imaging demonstrated enhancing mass within left anterior temporal lobe, which was mostly resected by Dr. Cheryle on 11/21/20.  Since surgery she had not had recurrence of seizures.  She complains of painful cramping of her right leg, which started ~1 week prior.  At this time the cramping is her main complaint.  Otherwise fully functional, independent, would like to return to work if possible.  Medications: Current Outpatient Medications on File Prior to Visit  Medication Sig Dispense Refill   amLODipine  (NORVASC ) 10 MG tablet TAKE 1 TABLET BY MOUTH EVERY DAY 90 tablet 1   aspirin  EC 325 MG tablet TAKE 1 TABLET BY MOUTH EVERY DAY 90 tablet 2   escitalopram  (LEXAPRO ) 10 MG tablet TAKE 1 TABLET BY MOUTH EVERY DAY 90 tablet 1   HYDROcodone -acetaminophen  (NORCO/VICODIN) 5-325 MG tablet Take 0.5-1 tablets by mouth every 4 (four) hours as needed for moderate pain (pain score 4-6). 45 tablet 0   hydrocortisone  (CORTEF ) 10 MG tablet TAKE 1 TABLET BY MOUTH EVERY DAY 30 tablet 0   Lacosamide  100 MG TABS TAKE 2 TABLETS BY MOUTH 2 TIMES DAILY. 120 tablet 2   levETIRAcetam  (KEPPRA ) 750 MG tablet TAKE 2 TABLETS (1,500 MG TOTAL) BY MOUTH 2 (TWO) TIMES DAILY. 360 tablet 1   levothyroxine  (SYNTHROID ) 100 MCG tablet TAKE 1 TABLET BY MOUTH EVERY DAY 90 tablet 3   LORazepam  (ATIVAN ) 2 MG tablet TAKE 1 TABLET (2 MG TOTAL) BY MOUTH EVERY 8 (EIGHT) HOURS AS  NEEDED FOR SEIZURE. 12 tablet 0   losartan  (COZAAR ) 50 MG tablet TAKE 1 TABLET BY MOUTH EVERY DAY 90 tablet 1   ondansetron  (ZOFRAN -ODT) 4 MG disintegrating tablet Take 1 tablet (4 mg total) by mouth every 8 (eight) hours as needed for nausea or vomiting. 10 tablet 0   Potassium Chloride  ER 20 MEQ TBCR Take 1 tablet (20 mEq total) by mouth daily. (Patient taking differently: Take 1 tablet by mouth every evening.) 90 tablet 3   prochlorperazine  (COMPAZINE ) 10 MG  tablet Take 10 mg by mouth every 6 (six) hours as needed for nausea or vomiting.     vitamin B-12 (VITAMIN B12) 500 MCG tablet Take 1 tablet (500 mcg total) by mouth daily. 90 tablet 0   Vitamin D , Ergocalciferol , (DRISDOL ) 1.25 MG (50000 UNIT) CAPS capsule Take 1 capsule (50,000 Units total) by mouth every 7 (seven) days. 4 capsule 0   No current facility-administered medications on file prior to visit.    Allergies: No Known Allergies Past Medical History:  Past Medical History:  Diagnosis Date   Bell's palsy    left side of face- notied in smile and eyes, if very tired   Brain cancer ([name])    Complication of anesthesia    N/V   Essential hypertension    External hemorrhoids    History of blood transfusion    post op Hemorroids   History of pneumonia    Hyperlipidemia    Hyperthyroidism    hx 58 years old- oral meds   Iron deficiency anemia    Migraine headache    Pneumonia    x 2 - years ago   PONV (postoperative nausea and vomiting)    PPD positive    Pulmonary nodules    not seen on plain cxr, first detected 1/07 re ct 10/08 pos IPPD > 15 mm 12/09 FOB/lavage 04/20/08 neg afb smear   PVC's (premature ventricular contractions)    Seizure ([name]) 11/15/2020   Sleep apnea    refused CPAP- mouth piece recommended   Tuberculosis    in her late 30's   Past Surgical History:  Past Surgical History:  Procedure Laterality Date   ABDOMINAL HYSTERECTOMY     APPLICATION OF CRANIAL NAVIGATION N/A 11/21/2020   Procedure: APPLICATION OF CRANIAL NAVIGATION;  Surgeon: Cheryle Debby LABOR, MD;  Location: [facility] OR;  Service: Neurosurgery;  Laterality: N/A;   BREAST BIOPSY Right 2014   BREAST BIOPSY Left 10/29/2019   CHOLECYSTECTOMY  2014   COLONOSCOPY  05/04/2009   prolapsing external hemorrhoids   CRANIOTOMY Left 11/21/2020   Procedure: Left craniotomy for tumor resection;  Surgeon: Cheryle Debby LABOR, MD;  Location: [facility] OR;  Service: Neurosurgery;  Laterality: Left;   ENDOMETRIAL  ABLATION     HEMORRHOID SURGERY     x 3   TUBAL LIGATION     Social History:  Social History   Socioeconomic History   Marital status: Divorced    Spouse name: Not on file   Number of children: 2   Years of education: Not on file   Highest education level: Associate degree: occupational, Scientist, product/process development, or vocational program  Occupational History   Occupation: accounts IT trainer: [employer]  Tobacco Use   Smoking status: Never   Smokeless tobacco: Never  Vaping Use   Vaping status: Never Used  Substance and Sexual Activity   Alcohol  use: No    Alcohol /week: 0.0 standard drinks of alcohol    Drug use: No  Sexual activity: Not on file  Other Topics Concern   Not on file  Social History Narrative   Divorced with 2 children   She works as a Merchandiser, retail in Aud[employer]    Possible TB exposure 2008 (after nodules found)   From [place] lived there and [place]   Never owned birds      Social Drivers of Health   Financial Resource Strain: Medium Risk (06/13/2021)   Overall Financial Resource Strain (CARDIA)    Difficulty of Paying Living Expenses: Somewhat hard  Food Insecurity: No Food Insecurity (10/05/2023)   Hunger Vital Sign    Worried About Running Out of Food in the Last Year: Never true    Ran Out of Food in the Last Year: Never true  Transportation Needs: No Transportation Needs (10/05/2023)   PRAPARE - Administrator, Civil Service (Medical): No    Lack of Transportation (Non-Medical): No  Physical Activity: Unknown (04/23/2021)   Exercise Vital Sign    Days of Exercise per Week: 0 days    Minutes of Exercise per Session: Not on file  Stress: No Stress Concern Present (04/23/2021)   [employer] of Occupational Health - Occupational Stress Questionnaire    Feeling of Stress : Only a little  Social Connections: Unknown (09/16/2021)   [facility]   Social Network    Social Network: Not on file  Intimate Partner Violence: Not At Risk  (10/05/2023)   Humiliation, Afraid, Rape, and Kick questionnaire    Fear of Current or Ex-Partner: No    Emotionally Abused: No    Physically Abused: No    Sexually Abused: No   Family History:  Family History  Problem Relation Age of Onset   Diabetes type II Mother    Kidney failure Sister    Diabetes Mellitus II Sister    Cancer Maternal Aunt        Stomach cancer   Colon cancer Maternal Aunt        aunts and uncles   Diabetes Mellitus II Maternal Aunt    Pancreatic cancer Maternal Aunt    Colon cancer Maternal Uncle    Diabetes Mellitus II Maternal Uncle    Heart disease Maternal Grandmother        aunts, uncles, sister   Diabetes Mellitus II Maternal Grandmother    Lung cancer Other        aunts and uncles    Review of Systems: Constitutional: poor appetite Eyes: blurriness of vision Ears, nose, mouth, throat, and face: Doesn't report sore throat Respiratory: Doesn't report cough, dyspnea or wheezes Cardiovascular: Doesn't report palpitation, chest discomfort  Gastrointestinal:  Doesn't report nausea, constipation, diarrhea GU: Doesn't report incontinence Skin: Doesn't report skin rashes Neurological: Per HPI Musculoskeletal: bilateral shoulder pain Behavioral/Psych: +anxiety  Physical Exam: Vitals:   11/24/23 1035 11/24/23 1036  BP: (!) 150/106 (!) 149/100  Pulse: 67   Resp: 20   Temp: 97.7 F (36.5 C)   SpO2: 97%      KPS: 60. General: Alert, cooperative, pleasant, in no acute distress Head: Normal EENT: No conjunctival injection or scleral icterus.  Lungs: Resp effort normal Cardiac: Regular rate Abdomen: Non-distended abdomen Skin: No rashes cyanosis or petechiae. Extremities: No clubbing or edema  Neurologic Exam: Mental Status: Awake, alert, attentive to examiner. Oriented to self and environment. Language is notable for impairment in fluency.  Comprehension limited with multi-step commands.  Impaired short term recall. Cranial Nerves: Visual  acuity is grossly normal. Visual  fields are full. Extra-ocular movements intact. No ptosis. Face L LMN paresis, chronic. Motor: Tone and bulk are normal. Power is 4+/5 right arm and leg. Reflexes are symmetric, no pathologic reflexes present.  Sensory: Intact to light touch Gait: Dystaxic  Labs: I have reviewed the data as listed    Component Value Date/Time   NA 136 10/09/2023 0611   K 3.3 (L) 10/09/2023 0611   CL 103 10/09/2023 0611   CO2 22 10/09/2023 0611   GLUCOSE 80 10/09/2023 0611   BUN 8 10/09/2023 0611   CREATININE 0.83 10/09/2023 0611   CREATININE 1.04 (H) 08/19/2023 1033   CREATININE 0.92 01/25/2020 1424   CALCIUM  9.5 10/09/2023 0611   PROT 6.5 10/06/2023 0527   ALBUMIN  3.2 (L) 10/06/2023 0527   AST 26 10/06/2023 0527   AST 14 (L) 08/19/2023 1033   ALT 34 10/06/2023 0527   ALT 12 08/19/2023 1033   ALKPHOS 73 10/06/2023 0527   BILITOT 3.7 (H) 10/06/2023 0527   BILITOT 1.1 08/19/2023 1033   GFRNONAA >60 10/09/2023 0611   GFRNONAA >60 08/19/2023 1033   GFRNONAA 72 01/25/2020 1424   GFRAA 83 01/25/2020 1424   Lab Results  Component Value Date   WBC 3.8 (L) 10/09/2023   NEUTROABS 4.9 10/05/2023   HGB 12.0 10/09/2023   HCT 36.8 10/09/2023   MCV 93.2 10/09/2023   PLT 186 10/09/2023    Imaging:  [facility] Clinician Interpretation: I have personally reviewed the CNS images as listed.  My interpretation, in the context of the patient's clinical presentation, is progressive disease   MR BRAIN W WO CONTRAST Result Date: 11/21/2023 CLINICAL DATA:  Provided history: Glioblastoma with isocitrate dehydrogenase gene wild type. Brain/CNS neoplasm, assess treatment response. EXAM: MRI HEAD WITHOUT AND WITH CONTRAST TECHNIQUE: Multiplanar, multiecho pulse sequences of the brain and surrounding structures were obtained without and with intravenous contrast. CONTRAST:  8mL GADAVIST  GADOBUTROL  1 MMOL/ML IV SOLN COMPARISON:  Brain MRI 10/06/2023. FINDINGS: Brain: Continued interval  increase in extent of enhancement along the frontal horn of the left lateral ventricle and extending more inferiorly within the left frontal lobe. Enhancement at this site now measures up to 2.4 x 1.2 cm in transaxial dimensions (previously 2.0 x 0.7) (series 16, image 70). New trace restricted diffusion along the left lateral ventricle frontal horn (series 5, image 27). A 5 mm nodular focus of enhancement along the temporal horn of the left lateral ventricle has progressed (previously punctate) (series 16, image 61). Abnormal ependymal enhancement along the left lateral ventricle temporal horn and atrium have also progressed (for instance as seen on series 16, images 62 and 79). Restricted diffusion at the left temporal stem and in the left periatrial region has subtly progressed. New 4 mm focus of nodular enhancement along the atrium of the left lateral ventricle (series 16, image 91). Precontrast T1 hyperintense signal abnormality and enhancement elsewhere at the left temporal stem and in the left periatrial region has not significantly changed. This includes an unchanged 5 mm nodular enhancing focus in the left periatrial region (series 16, image 87). Foci of restricted diffusion elsewhere along the posterior body of the left lateral ventricle have not significantly changed. Previously demonstrated foci of restricted diffusion within the left occipital horn are no longer appreciated. Persistent although decreased foci of restricted diffusion within the left corona radiata/basal ganglia. Enhancement at this site is similar. T2 FLAIR hyperintense signal abnormality within the anterior left frontal lobe white matter has progressed. Extensive T2 FLAIR hyperintense signal abnormality (  and scattered chronic blood products) elsewhere within the left cerebral hemisphere have not significantly changed. Redemonstrated anterior left temporal lobe resection site. No extra-axial fluid collection. No midline shift. Vascular:  Maintained flow voids within the proximal large arterial vessels. Skull and upper cervical spine: Left pterional cranioplasty. Sinuses/Orbits: No mass or acute finding within the imaged orbits. Opacification of a single left ethmoid air cell. Tiny mucous retention cyst within the left maxillary sinus. Impression #1 will be called to the ordering clinician or representative by the Radiologist Assistant, and communication documented in the PACS or [employer]. IMPRESSION: 1. Progressive findings affecting the anterior left frontal lobe, left temporal lobe, ependymal margin of the left lateral ventricle temporal horn and atrium, as well as left temporal stem/periatrial region, as detailed within the body of the report. These findings are worrisome for tumor progression. 2. Non-progressive evolution of findings elsewhere as described. Electronically Signed   By: Rockey Childs D.O.   On: 11/21/2023 12:59    Assessment/Plan 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)  Seizure disorder ([name])  [name] is clinically stable from focal neurologic standpoint; she does have increasing fatigue and sleepiness over time.  MRI brain demonstrates progression of disease within the left frontal lobe re-treatment site, as well as more modestly within prior disease site within left temporal lobe and subcortex.  Etiologies could be mixed organic tumor progression with/without radiation necrosis.  It is easier to account for left frontal progression given recent re-irradiation, having been completed in late February.  Posterior changes are more subtle but more concerning for frank recurrent glioblastoma.  Avastin  had been held because of disabling stroke following last infusion.  We discussed goals of care extensively today, doing our best to assess prognosis given the complexity of the imaging findings.  Reviewed the following options: Resume CDT-11 90mg /m2 with avastin  10mg /kg IV q2-3 weeks.   This had been very effective previously, with cytopenias noted Resume avastin  only, if patient prefers to avoid further cytotoxic chemtherapy.  Risk of recurrent stroke is noted, patient is now on full dose ASA Transition to hospice.  Case was discussed and reviewed with palliative care team today  In the meantime, Hydrocortisone  may con't 10mg  daily.  For seizures, will con't Vimpat  200mg  BID, Keppra  1500mg  BID.  Patient will take time to review these options with family, risks and benefits were reviewed in as much detail as possible.    We ask that [name] return to clinic later this week for further goals of care discussion, treatment plan decision.  The total time spent in the encounter was 40 minutes and more than 50% was on counseling and review of test results   Arthea MARLA Manns, MD Medical Director of Neuro-Oncology [facility] at [place] 11/24/23 10:39 AM

## 2023-11-24 NOTE — Progress Notes (Signed)
 Palliative Medicine [facility]  [phone]) [phone] Fax:(336) [phone]   Name: [name] Date: 11/24/2023 MRN: [id number]  DOB: August 28, 1965  Patient Care Team: Merna Huxley, NP as PCP - General (Family Medicine) Varney Merdis CHRISTELLA, MD as Referring Physician (Gastroenterology) Lanny Callander, MD as Consulting Physician (Oncology) Buckley Zachary K, MD as Consulting Physician (Oncology) Pickenpack-Cousar, Fannie SAILOR, NP as Nurse Practitioner [facility] and Palliative Medicine)    INTERVAL HISTORY: KAMBRA BEACHEM is a 58 y.o. female with medical history of glioblastoma s/p resection (11/2020) IMRT, currently on dexamethasone  and Temodar , pancreatic lesion, hypertension, and hyperlipidemia. Palliative ask to see for symptom management and goals of care.   SOCIAL HISTORY:     reports that she has never smoked. She has never used smokeless tobacco. She reports that she does not drink alcohol  and does not use drugs.  ADVANCE DIRECTIVES:  None on file   CODE STATUS: Full code  PAST MEDICAL HISTORY: Past Medical History:  Diagnosis Date   Bell's palsy    left side of face- notied in smile and eyes, if very tired   Brain cancer ([name])    Complication of anesthesia    N/V   Essential hypertension    External hemorrhoids    History of blood transfusion    post op Hemorroids   History of pneumonia    Hyperlipidemia    Hyperthyroidism    hx 58 years old- oral meds   Iron deficiency anemia    Migraine headache    Pneumonia    x 2 - years ago   PONV (postoperative nausea and vomiting)    PPD positive    Pulmonary nodules    not seen on plain cxr, first detected 1/07 re ct 10/08 pos IPPD > 15 mm 12/09 FOB/lavage 04/20/08 neg afb smear   PVC's (premature ventricular contractions)    Seizure ([name]) 11/15/2020   Sleep apnea    refused CPAP- mouth piece recommended   Tuberculosis    in her late 30's    ALLERGIES:  has no known allergies.  MEDICATIONS:   Current Outpatient Medications  Medication Sig Dispense Refill   amLODipine  (NORVASC ) 10 MG tablet TAKE 1 TABLET BY MOUTH EVERY DAY 90 tablet 1   aspirin  EC 325 MG tablet TAKE 1 TABLET BY MOUTH EVERY DAY 90 tablet 2   escitalopram  (LEXAPRO ) 10 MG tablet TAKE 1 TABLET BY MOUTH EVERY DAY 90 tablet 1   HYDROcodone -acetaminophen  (NORCO/VICODIN) 5-325 MG tablet Take 0.5-1 tablets by mouth every 4 (four) hours as needed for moderate pain (pain score 4-6). 45 tablet 0   hydrocortisone  (CORTEF ) 10 MG tablet TAKE 1 TABLET BY MOUTH EVERY DAY 30 tablet 0   Lacosamide  100 MG TABS TAKE 2 TABLETS BY MOUTH 2 TIMES DAILY. 120 tablet 2   levETIRAcetam  (KEPPRA ) 750 MG tablet TAKE 2 TABLETS (1,500 MG TOTAL) BY MOUTH 2 (TWO) TIMES DAILY. 360 tablet 1   levothyroxine  (SYNTHROID ) 100 MCG tablet TAKE 1 TABLET BY MOUTH EVERY DAY 90 tablet 3   LORazepam  (ATIVAN ) 2 MG tablet TAKE 1 TABLET (2 MG TOTAL) BY MOUTH EVERY 8 (EIGHT) HOURS AS NEEDED FOR SEIZURE. 12 tablet 0   losartan  (COZAAR ) 50 MG tablet TAKE 1 TABLET BY MOUTH EVERY DAY 90 tablet 1   ondansetron  (ZOFRAN -ODT) 4 MG disintegrating tablet Take 1 tablet (4 mg total) by mouth every 8 (eight) hours as needed for nausea or vomiting. 10 tablet 0   Potassium Chloride  ER 20 MEQ  TBCR Take 1 tablet (20 mEq total) by mouth daily. (Patient taking differently: Take 1 tablet by mouth every evening.) 90 tablet 3   prochlorperazine  (COMPAZINE ) 10 MG tablet Take 10 mg by mouth every 6 (six) hours as needed for nausea or vomiting.     vitamin B-12 (VITAMIN B12) 500 MCG tablet Take 1 tablet (500 mcg total) by mouth daily. 90 tablet 0   Vitamin D , Ergocalciferol , (DRISDOL ) 1.25 MG (50000 UNIT) CAPS capsule Take 1 capsule (50,000 Units total) by mouth every 7 (seven) days. 4 capsule 0   No current facility-administered medications for this visit.    VITAL SIGNS: There were no vitals taken for this visit. There were no vitals filed for this visit.  Estimated body mass index is  26.67 kg/m as calculated from the following:   Height as of 10/02/23: 5' 7 (1.702 m).   Weight as of an earlier encounter on 11/24/23: 170 lb 4.8 oz (77.2 kg).   PERFORMANCE STATUS (ECOG) : 1 - Symptomatic but completely ambulatory   Physical Exam General: NAD Cardiovascular: regular rate and rhythm Pulmonary: normal breathing pattern Extremities: no edema, right side weakness  Skin: no rashes Neurological: AAO x3, slow to respond, left facial impairment   IMPRESSION: Discussed the use of AI scribe software for clinical note transcription with the patient, who gave verbal consent to proceed.  History of Present Illness [name] [name] is a 58 year old female with a history of glioblastoma who presents for ongoing management and support. She is accompanied by her daughter and cousin. Patient request persistent fatigue. She experiences fluctuating mood states, currently feeling 'down'. Her energy levels vary, with some days being better than others. No pain or headaches are reported.  Her sleep pattern is irregular, with periods of sleep interspersed with wakefulness. Appetite also varies, with some days being better than others. She spends her time watching TV and resting, with activity levels dependent on her daily condition. Family reports patient is sleeping on and off throughout the day. More than usual. Denies recent seizure activity.   All questions answered and support provided.    Goals of care We discussed at length patient's current cancer recurrence, overall health, and was most important to TJ and her family. They are emotional expressing thoughts and feeling related to recent visit with Dr. Buckley. Patient and family relying on their strong [name] faith.   They are realistic in their understanding expressing known options related to patient's disease progression. Patient and family verbalized plans to discuss as a family over the next few days prior to making complex  decisions. She is clear that her quality of life is most important.   I discussed the importance of continued conversation with family and their medical providers regarding overall plan of care and treatment options, ensuring decisions are within the context of the patients values and GOCs.  06/19/23: The patient's emotional and psychological well-being is also a significant concern. The recurrence of the cancer has understandably caused fear and anxiety.  she has a strong support system, including a cousin and her brother who is also her pastor.  They are both actively involved in her care. The patient expresses a desire for transparency and honesty from the medical team, indicating a preference for direct communication about her condition and prognosis.   The patient's overall goal is not only to fight the disease but also to help others who are dealing with cancer. Despite the challenges, the patient remains hopeful and  determined, demonstrating resilience in the face of adversity.  We discussed at length while TJ is remaining hopeful for the best she and family are also preparing for the worst (further disease progression).  She has a strong [place] faith and is relying on this to support her throughout this upcoming journey.   Patient and family are realistic in their understanding.  We discussed what care would look like if further progression or decreasing quality of life.  Education provided on hospice support as inquired by patient and family.  Again Ms. Mcwilliams's focus on treating the treatable with hopes of similar positive responses as she did in the past.   She and family verbalized understanding of treatment plan and expressed wishes to maintain close contact for ongoing support as she navigates the upcoming weeks involved in treatment.  They are aware palliative will continue to support in collaboration with her oncology medical team as needed.   08/29/2022: Ms. Karim expresses the desire  to live each day to its fullest. She is concerned that the new lesions on top of her head represent cancer spread. She expresses being frustrated with seizure episodes as well as a decreased ability to concentrate. Emotional support provided through active listening and quiet presence. Assessment & Plan Glioblastoma  Glioblastoma with headaches, depression, and right-sided weakness. Confusion with yes/no responses. Continues working with accommodations. Per family; plans for for follow-up scans   Headaches Frequent headaches inadequately managed with Aleve. Consideration for stronger analgesic due to severity. - Prescribed hydrocodone  2.5-5 mg, instruct to take one every eight hours as needed for severe headaches. Start with half a tablet to assess tolerance.  Right-sided weakness Chronic right-sided weakness secondary to brain injury, affecting mobility and balance. - Encourage safe ambulation with assistance as needed.  Depression Depression with fluctuating mood, characterized by variable appetite and sleep patterns. No current pain or headaches reported. - Encourage open communication with healthcare team for any questions or concerns. - Advise to contact Dr. Eward team for further questions or to determine next steps. - Provide contact information for support and to discuss any issues that may arise.  Follow-up Follow-up in 3-4 weeks. Sooner if needed.  - Remain available for changes or needs before next appointment.  Patient expressed understanding and was in agreement with this plan. She also understands that She can call the clinic at any time with any questions, concerns, or complaints.   Visit consisted of counseling and education dealing with the complex and emotionally intense issues of symptom management and palliative care in the setting of serious and potentially life-threatening illness.  Levon Borer, AGPCNP-BC  Palliative Medicine Team/[facility]

## 2023-11-25 ENCOUNTER — Ambulatory Visit: Admitting: Physical Therapy

## 2023-11-27 ENCOUNTER — Ambulatory Visit: Admitting: Physical Therapy

## 2023-11-28 NOTE — Progress Notes (Unsigned)
 Pt's daughter called and left a message that they had decided to go with Hospice. Will let Dr Buckley and Levon Borer NP know.

## 2023-12-01 ENCOUNTER — Encounter: Payer: Self-pay | Admitting: Nurse Practitioner

## 2023-12-01 ENCOUNTER — Inpatient Hospital Stay (HOSPITAL_BASED_OUTPATIENT_CLINIC_OR_DEPARTMENT_OTHER): Admitting: Nurse Practitioner

## 2023-12-01 DIAGNOSIS — Z515 Encounter for palliative care: Secondary | ICD-10-CM

## 2023-12-01 DIAGNOSIS — Z7189 Other specified counseling: Secondary | ICD-10-CM | POA: Diagnosis not present

## 2023-12-01 DIAGNOSIS — C719 Malignant neoplasm of brain, unspecified: Secondary | ICD-10-CM

## 2023-12-01 DIAGNOSIS — R530 Neoplastic (malignant) related fatigue: Secondary | ICD-10-CM | POA: Diagnosis not present

## 2023-12-01 DIAGNOSIS — C250 Malignant neoplasm of head of pancreas: Secondary | ICD-10-CM | POA: Diagnosis not present

## 2023-12-01 NOTE — Progress Notes (Signed)
 Palliative Medicine [facility]  Telephone:(336) [phone] Fax:(336) [phone]   Name: [name] Date: 12/01/2023 MRN: [id number]  DOB: June 26, 1965  Patient Care Team: Merna Huxley, NP as PCP - General (Family Medicine) Varney Merdis CHRISTELLA, MD as Referring Physician (Gastroenterology) Lanny Callander, MD as Consulting Physician (Oncology) Buckley Zachary K, MD as Consulting Physician (Oncology) Pickenpack-Cousar, Fannie SAILOR, NP as Nurse Practitioner [facility] and Palliative Medicine)   I connected with Alean CHRISTELLA Lunger on 12/01/23 at  9:30 AM EDT by phone and verified that I am speaking with the correct person using two identifiers.   I discussed the limitations, risks, security and privacy concerns of performing an evaluation and management service by telemedicine and the availability of in-person appointments. I also discussed with the patient that there may be a patient responsible charge related to this service. The patient expressed understanding and agreed to proceed.   Other persons participating in the visit and their role in the encounter: Yolande Ku (Daughter)   Patient's location: Home   Provider's location: [facility]    Chief Complaint: Goals of Care    INTERVAL HISTORY: [name] is a 58 y.o. female with medical history of glioblastoma s/p resection (11/2020) IMRT, currently on dexamethasone  and Temodar , pancreatic lesion, hypertension, and hyperlipidemia. Palliative ask to see for symptom management and goals of care.   SOCIAL HISTORY:     reports that she has never smoked. She has never used smokeless tobacco. She reports that she does not drink alcohol  and does not use drugs.  ADVANCE DIRECTIVES:  None on file   CODE STATUS: Full code  PAST MEDICAL HISTORY: Past Medical History:  Diagnosis Date   Bell's palsy    left side of face- notied in smile and eyes, if very tired   Brain cancer ([name])    Complication of anesthesia    N/V   Essential  hypertension    External hemorrhoids    History of blood transfusion    post op Hemorroids   History of pneumonia    Hyperlipidemia    Hyperthyroidism    hx 58 years old- oral meds   Iron deficiency anemia    Migraine headache    Pneumonia    x 2 - years ago   PONV (postoperative nausea and vomiting)    PPD positive    Pulmonary nodules    not seen on plain cxr, first detected 1/07 re ct 10/08 pos IPPD > 15 mm 12/09 FOB/lavage 04/20/08 neg afb smear   PVC's (premature ventricular contractions)    Seizure ([name]) 11/15/2020   Sleep apnea    refused CPAP- mouth piece recommended   Tuberculosis    in her late 30's    ALLERGIES:  has no known allergies.  MEDICATIONS:  Current Outpatient Medications  Medication Sig Dispense Refill   amLODipine  (NORVASC ) 10 MG tablet TAKE 1 TABLET BY MOUTH EVERY DAY 90 tablet 1   aspirin  EC 325 MG tablet TAKE 1 TABLET BY MOUTH EVERY DAY 90 tablet 2   escitalopram  (LEXAPRO ) 10 MG tablet TAKE 1 TABLET BY MOUTH EVERY DAY 90 tablet 1   HYDROcodone -acetaminophen  (NORCO/VICODIN) 5-325 MG tablet Take 0.5-1 tablets by mouth every 4 (four) hours as needed for moderate pain (pain score 4-6). 45 tablet 0   hydrocortisone  (CORTEF ) 10 MG tablet TAKE 1 TABLET BY MOUTH EVERY DAY 30 tablet 0   Lacosamide  100 MG TABS TAKE 2 TABLETS BY MOUTH 2 TIMES DAILY. 120 tablet 2  levETIRAcetam  (KEPPRA ) 750 MG tablet TAKE 2 TABLETS (1,500 MG TOTAL) BY MOUTH 2 (TWO) TIMES DAILY. 360 tablet 1   levothyroxine  (SYNTHROID ) 100 MCG tablet TAKE 1 TABLET BY MOUTH EVERY DAY 90 tablet 3   LORazepam  (ATIVAN ) 2 MG tablet TAKE 1 TABLET (2 MG TOTAL) BY MOUTH EVERY 8 (EIGHT) HOURS AS NEEDED FOR SEIZURE. 12 tablet 0   losartan  (COZAAR ) 50 MG tablet TAKE 1 TABLET BY MOUTH EVERY DAY 90 tablet 1   ondansetron  (ZOFRAN -ODT) 4 MG disintegrating tablet Take 1 tablet (4 mg total) by mouth every 8 (eight) hours as needed for nausea or vomiting. 10 tablet 0   Potassium Chloride  ER 20 MEQ TBCR Take 1  tablet (20 mEq total) by mouth daily. (Patient taking differently: Take 1 tablet by mouth every evening.) 90 tablet 3   prochlorperazine  (COMPAZINE ) 10 MG tablet Take 10 mg by mouth every 6 (six) hours as needed for nausea or vomiting.     vitamin B-12 (VITAMIN B12) 500 MCG tablet Take 1 tablet (500 mcg total) by mouth daily. 90 tablet 0   Vitamin D , Ergocalciferol , (DRISDOL ) 1.25 MG (50000 UNIT) CAPS capsule Take 1 capsule (50,000 Units total) by mouth every 7 (seven) days. 4 capsule 0   No current facility-administered medications for this visit.    VITAL SIGNS: There were no vitals taken for this visit. There were no vitals filed for this visit.  Estimated body mass index is 26.67 kg/m as calculated from the following:   Height as of 10/02/23: 5' 7 (1.702 m).   Weight as of 11/24/23: 170 lb 4.8 oz (77.2 kg).   PERFORMANCE STATUS (ECOG) : 1 - Symptomatic but completely ambulatory   IMPRESSION: Discussed the use of AI scribe software for clinical note transcription with the patient, who gave verbal consent to proceed.  History of Present Illness [name] is a 58 year old female with a history of glioblastoma who I connected with by phone for ongoing goals of care and complex decision making. I spoke at length with patient's daughter, Yolande via phone after receiving notification the would like to proceed with a more comfort care approach.   Daughter and family confirms patient's health continues to decline. She is sleeping more and having increasing signs of aphasia. Patient also requiring assistance with ADLs.   All questions answered and support provided.    Goals of care We discussed at length patient's current cancer recurrence, overall health, and was most important to TJ and her family.  They are realistic in their understanding expressing known options related to patient's disease progression. Daughter speaks on behalf of Ms. Schnapp and their family after extensive  discussions they would like to focus on her quality and comfort for what time she has left.   Kia shares they would like to proceed with hospice support in the home. I acknowledge understanding. We discussed at length the referral process. Extensive education provided on hospice's goals and philosophy of care, what care would look like in the home, end-of-life expectations, and minimizing outside care to allow hospice to manage all needs. I also discussed inpatient hospice support if needed with understanding family desires patient to remain in the home if at all possible.   Daughter verbalized understanding that all care would focus on how TJ is looking and feeling. This would include management of any symptoms that may cause discomfort, pain, shortness of breath, cough, nausea, agitation, anxiety, and/or secretions etc. Symptoms would be managed with medications and  other non-pharmacological interventions such as spiritual support, repositioning, music therapy, or therapeutic listening. Family verbalized understanding and appreciation.   Family would like to proceed with outpatient hospice referral. Denies need for home DME at this time. Referral to be placed with [facility] as requested.  I discussed the importance of continued conversation with family and their medical providers regarding overall plan of care and treatment options, ensuring decisions are within the context of the patients values and GOCs. Assessment & Plan Glioblastoma  Glioblastoma with headaches, depression, and right-sided weakness.   Headaches Frequent headaches inadequately managed with Aleve. Consideration for stronger analgesic due to severity. - Prescribed hydrocodone  2.5-5 mg, instruct to take one every eight hours as needed for severe headaches. Start with half a tablet to assess tolerance.  Right-sided weakness Chronic right-sided weakness secondary to brain injury, affecting mobility and balance. - Encourage safe  ambulation with assistance as needed.  Goals of Care Patient and family have made decision to focus on comfort and quality of life for what time she has left. They are realistic in their understanding of prognosis.  -Extensive goals of care discussions. Education provided on outpatient hospice and referral process. Family has requested referral to be placed with [facility].   No follow-up needed.   Patient expressed understanding and was in agreement with this plan. She also understands that She can call the clinic at any time with any questions, concerns, or complaints.   Visit consisted of counseling and education dealing with the complex and emotionally intense issues of symptom management and palliative care in the setting of serious and potentially life-threatening illness.  Levon Borer, AGPCNP-BC  Palliative Medicine Team/[facility]

## 2023-12-01 NOTE — Progress Notes (Signed)
 Hi Dr. Buckley,  Yes sorry for delayed response, I called her family first thing this morning and have placed all orders. Hospice is going out this afternoon. Thank you.

## 2023-12-03 ENCOUNTER — Ambulatory Visit: Admitting: Occupational Therapy

## 2023-12-03 ENCOUNTER — Ambulatory Visit: Admitting: Physical Therapy

## 2023-12-03 NOTE — Therapy (Incomplete)
 OUTPATIENT OCCUPATIONAL THERAPY NEURO Treatment Note  Patient Name: [name] MRN: [id number] DOB:03-21-1966, 58 y.o., female Today's Date: 12/03/2023  PCP: Merna Huxley, NP REFERRING PROVIDER:   Caleen Burgess BROCKS, MD    END OF SESSION:    Past Medical History:  Diagnosis Date   Bell's palsy    left side of face- notied in smile and eyes, if very tired   Brain cancer ([name])    Complication of anesthesia    N/V   Essential hypertension    External hemorrhoids    History of blood transfusion    post op Hemorroids   History of pneumonia    Hyperlipidemia    Hyperthyroidism    hx 58 years old- oral meds   Iron deficiency anemia    Migraine headache    Pneumonia    x 2 - years ago   PONV (postoperative nausea and vomiting)    PPD positive    Pulmonary nodules    not seen on plain cxr, first detected 1/07 re ct 10/08 pos IPPD > 15 mm 12/09 FOB/lavage 04/20/08 neg afb smear   PVC's (premature ventricular contractions)    Seizure ([name]) 11/15/2020   Sleep apnea    refused CPAP- mouth piece recommended   Tuberculosis    in her late 30's   Past Surgical History:  Procedure Laterality Date   ABDOMINAL HYSTERECTOMY     APPLICATION OF CRANIAL NAVIGATION N/A 11/21/2020   Procedure: APPLICATION OF CRANIAL NAVIGATION;  Surgeon: Cheryle Debby LABOR, MD;  Location: [facility] OR;  Service: Neurosurgery;  Laterality: N/A;   BREAST BIOPSY Right 2014   BREAST BIOPSY Left 10/29/2019   CHOLECYSTECTOMY  2014   COLONOSCOPY  05/04/2009   prolapsing external hemorrhoids   CRANIOTOMY Left 11/21/2020   Procedure: Left craniotomy for tumor resection;  Surgeon: Cheryle Debby LABOR, MD;  Location: [facility] OR;  Service: Neurosurgery;  Laterality: Left;   ENDOMETRIAL ABLATION     HEMORRHOID SURGERY     x 3   TUBAL LIGATION     Patient Active Problem List   Diagnosis Date Noted   Carcinoma of head of pancreas ([name]) 10/05/2023   Seizure disorder ([name]) 10/05/2023   Generalized seizure disorder  ([name]) 10/05/2023   Neutropenia ([name]) 10/18/2021   Genetic testing 08/24/2021   Family history of colon cancer 08/03/2021   Pancreatic mass 07/31/2021   S/P craniotomy 11/21/2020   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) 11/21/2020   Focal seizures ([name]) 11/15/2020   OSA (obstructive sleep apnea) 06/13/2017   Hypothyroid 06/26/2015   Brachial neuritis 03/29/2015   Rotator cuff tear 12/21/2014   Abdominal pain, right upper quadrant 03/14/2014   Leg cramps 07/15/2013   Acquired anal stenosis 06/18/2011   Chronic constipation 06/18/2011   PVC's (premature ventricular contractions) 05/23/2011   Headache, migraine, intractable 05/09/2011   Asthma exacerbation, allergic 07/31/2010   POSITIVE PPD 05/04/2008   Hyperlipidemia 02/05/2008   Essential hypertension 02/05/2008   Pulmonary nodules 02/05/2008   Chronic cough 02/05/2008    ONSET DATE: referral date 10/08/23  REFERRING DIAG: G40.309 (ICD-10-CM) - Generalized seizure disorder ([name]) C71.9 (ICD-10-CM) - 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  Rationale for Evaluation and Treatment: Rehabilitation  SUBJECTIVE:   SUBJECTIVE STATEMENT: Pt diagnosed with brain cancer in 2022.  Pt's daughter reports stroke in March of this year with worsening of speech and R sided weakness.  Pt is still able to do majority of bathing and  dressing, but does need help with clothing fasteners.  Pt will occasionally eat with hands due to decreased coordination and ability to manipulate eating utensils.  Pt enjoyed working and helping out at [employer].    Pt had a fall Tuesday and a small seizure the same day.  Scheduled for MRI 7/18 and appt with [facility] on 7/21. Pt accompanied by: self and family member (daughter and sister)  PERTINENT HISTORY: PMH includes: seizures, Bell's palsy, brain cancer s/p crani for L temporal mass resection in 2022, CVA with R-sided weakness and  aphasia, HTN, HLD, hyperthyroidism, PVCs, OSA, TB.  Admitted to [facility] 6[facility]1 with reports of 3 seizures at home. EEG showed epileptogenicity arising from left temporal region with increased risk of seizure recurrence. MRI shows mildly increased enhancement and T2 hyperintensity in the left inferior frontal gyrus.  PRECAUTIONS: Fall  WEIGHT BEARING RESTRICTIONS: No  PAIN:  Are you having pain? No  FALLS: Has patient fallen in last 6 months? Yes. Number of falls 1 this past Tues  LIVING ENVIRONMENT: Lives with: lives with their family (daughter lives with pt and sister lives within 20 mins from their home) Lives in: House/apartment Stairs: Yes: External: 3 steps; can reach both Has following equipment at home: Walker - 2 wheeled, shower chair, bed side commode, and supposed to be getting grab bars soon  PLOF: Independent, Independent with basic ADLs, and Vocation/Vocational requirements: worked at [employer] in [place] [employer] and would help [employer] with transportation and cooking for the fish fry  PATIENT GOALS: want to take care of self  OBJECTIVE:  Note: Objective measures were completed at Evaluation unless otherwise noted.  HAND DOMINANCE: Right  ADLs: Overall ADLs: gets tired really easily Transfers/ambulation related to ADLs: walking without AD Eating: difficulty with use of utensils, will frequently use hands to eat Grooming: Mod I UB Dressing: occasional difficulty with clothing fasteners LB Dressing: Mod I Toileting: Mod I Bathing: intermittent assistance for thoroughness , gets Tub Shower transfers: sometimes a shower and sometimes a wash up at the sink, occasional assist to step out of tub/shower Equipment: Shower seat with back and bed side commode  IADLs: Meal Prep: occasionally pt will fix something for breakfast, no issues with forgetting steps or safety issues; dtr doing most of the cooking Community mobility: not cleared to drive due to seizures Medication management: dtr  will fill pill box and check behind to make sure she takes them Handwriting: not assessed due to decreased manipulation of pegs with 9 hole peg test  MOBILITY STATUS: Hx of falls and dragging RLE with fatigue  POSTURE COMMENTS:  No Significant postural limitations  ACTIVITY TOLERANCE: Activity tolerance: diminished  FUNCTIONAL OUTCOME MEASURES: PSFS: 4.8   UPPER EXTREMITY ROM:    Active ROM Right eval Left eval  Shoulder flexion 104 (116 PROM) WFL  Shoulder abduction 65   Shoulder adduction    Shoulder extension    Shoulder internal rotation Completing 50-60% of movement   Shoulder external rotation WFL (requiring increased time)   Elbow flexion    Elbow extension    Wrist flexion    Wrist extension    Wrist ulnar deviation    Wrist radial deviation    Wrist pronation    Wrist supination    (Blank rows = not tested)  UPPER EXTREMITY MMT:   Did not test MMT as the pt already exhibits decreased ROM.   HAND FUNCTION: TBD - dtr reports decreased grip strength on R  COORDINATION: 9 Hole Peg test:  Right: able to place 5 pegs in 2 min time limit sec; Left: 43.29 sec - requiring cue to remove pegs after placement on L  SENSATION: Light touch: Impaired on right   EDEMA: mild edema in R hand compared to L  COGNITION: Overall cognitive status: History of cognitive impairments - at baseline; difficult to assess due to expressive > receptive aphasia  VISION ASSESSMENT: Eye alignment: Impaired:  Gaze preference/alignment: gaze left Tracking/Visual pursuits: Decreased smoothness with horizontal tracking, Decreased smoothness of eye movement to Right superior field, Decreased smoothness of eye movement to Right inferior field, and Requires cues, head turns, or add eye shifts to track Saccades: impaired and pt fatigued quickly with testing Visual Fields: Right visual field deficits  Patient has difficulty with following activities due to following visual impairments:  reading  PERCEPTION: Impaired: Inattention/neglect: does not attend to right visual field and Impaired- to be further tested in functional concext  PRAXIS: Impaired: Motor planning  OBSERVATIONS: pt with support family - dtr and sister present during evaluation.  Pt positioned towards L in apparent R visual field deficit/inattention.  Pt demonstrating expressive > receptive aphasia impacting communication but with increased time and min cues pt able to answer many questions.                                                                                                                              TREATMENT DATE:  12/03/23 Grip strength PPT #2 (self-feeding) Box and blocks Initiate shoulder ROM HEP, coordination HEP (medbridge handouts), engaging in 2 step commands, cog/motor dual tasking,      11/10/23  Encouraged pt's family to find motivating tasks to increase participation and motivation as they expressed concerns about pt's motivation.  Encouraged family to problem solve how to increase pt participation in leisure tasks by helping with small tasks at church or increased engagement, with supervision, during meal prep and/or other household tasks.    PATIENT EDUCATION: Education details: Educated on role and purpose of OT as well as potential interventions and goals for therapy based on initial evaluation findings. Person educated: Patient and Child(ren), sister Education method: Explanation Education comprehension: verbalized understanding and needs further education  HOME EXERCISE PROGRAM: TBD   GOALS: Goals reviewed with patient? Yes  SHORT TERM GOALS: Target date: 12/12/23  Pt will be independent with ROM and coordination HEP with use of visual handouts. Baseline: new to OPOT Goal status: INITIAL  2.  Pt will verbalize understanding of energy conservation techniques. Baseline: diminished activity tolerance/engagement in previous IADLs Goal status: INITIAL  3.  Pt  will demonstrate improved functional use of dominant RUE to place 9 pegs of 9 hole peg test in 2 min time limit. Baseline: placed 5 pegs in 2 min Goal status: INITIAL  4.  Pt will verbalize understanding with safety considerations and A/E needs to ensure safety d/t decreased sensation. Baseline: decreased sensation dominant RUE Goal status: INITIAL   LONG TERM GOALS: Target  date: 01/09/24  Pt will demonstrate improved ease with feeding with utensils as evidenced by decreasing PPT#2 (self feeding) by 5 seconds or less than 30 secs. Baseline: TBD Goal status: INITIAL  2.  Pt will demonstrate improved fine motor coordination for ADLs (clothing fasteners) as evidenced by completing 9 hole peg test (place and remove) with RUE in 2 mins time limit Baseline: placing 5 pegs in 2 mins time limit Goal status: INITIAL  3.  Pt will demonstrate 30# grip strength (functional grip) or improvement by 8# to be able to maintain grasp as needed to open drink containers. Baseline: TBD Goal status: INITIAL  4.  Pt will complete table top scanning activity to increase attention to R environment with no cues for technique. Baseline: R inattention Goal status: INITIAL  5.  Pt will demonstrate ability to complete 2 step tasks with 1 or fewer cues for recall/sequencing. Baseline: increased time for one step commands, difficulty with 2 step tasks  Goal status: INITIAL  6.  Patient will report at least two-point increase in average PSFS score or at least three-point increase in a single activity score indicating functionally significant improvement given minimum detectable change. Baseline: 4.8 Goal status: INITIAL  ASSESSMENT:  CLINICAL IMPRESSION: Patient is a 58 y.o. female who was seen today for occupational therapy evaluation for impairments s/p recent hospitalization due to seizures.  Pt with h/o glioblastoma, seizure disorder, and R sided weakness and impairments in coordination and strength of  dominant RUE.  Pt with decreased awareness, decreased sequencing, and motor planning impacting safety with ADLs and IADLs.  Pt currently lives with adult daughter and family members who can assist while daughter works. Pt will benefit from skilled occupational therapy services to address strength and coordination, ROM, pain management, altered sensation, balance, GM/FM control, cognition, safety awareness, introduction of compensatory strategies/AE prn, visual-perception, and implementation of an HEP to improve participation and safety during ADLs and IADLs.    PERFORMANCE DEFICITS: in functional skills including ADLs, IADLs, coordination, proprioception, sensation, edema, tone, ROM, strength, pain, Fine motor control, Gross motor control, body mechanics, endurance, decreased knowledge of precautions, decreased knowledge of use of DME, vision, and UE functional use, cognitive skills including memory, problem solving, safety awareness, and sequencing, and psychosocial skills including coping strategies, environmental adaptation, and routines and behaviors.     PLAN:  OT FREQUENCY: 1-2x/week  OT DURATION: 6 weeks (asking 8 for scheduling needs)  PLANNED INTERVENTIONS: 97168 OT Re-evaluation, 97535 self care/ADL training, 02889 therapeutic exercise, 97530 therapeutic activity, 97112 neuromuscular re-education, 97140 manual therapy, 97035 ultrasound, 97032 electrical stimulation (manual), 97750 Physical Performance Testing, 02239 Orthotic Initial, passive range of motion, functional mobility training, visual/perceptual remediation/compensation, psychosocial skills training, energy conservation, coping strategies training, patient/family education, and DME and/or AE instructions  RECOMMENDED OTHER SERVICES: SLP  CONSULTED AND AGREED WITH PLAN OF CARE: Patient and family member/caregiver  PLAN FOR NEXT SESSION:  Assess grip strength, time PPT #2 (self-feeding), attempt box and blocks? - functional use  of RUE  initiate shoulder ROM HEP, coordination HEP (medbridge handouts), engaging in 2 step commands, cog/motor dual tasking,    [place], Fahad Cisse, OTR/L 12/03/2023, 9:26 AM   [facility] [address] [place], [place] [address] Phone # [phone] Fax # [phone]

## 2023-12-03 NOTE — Therapy (Deleted)
 OUTPATIENT PHYSICAL THERAPY NEURO EVALUATION   Patient Name: [name] MRN: [id number] DOB:03/10/1966, 58 y.o., female Today's Date: 12/03/2023   PCP: Merna Huxley, NP REFERRING PROVIDER: Caleen Burgess BROCKS, MD  END OF SESSION:    Past Medical History:  Diagnosis Date   Bell's palsy    left side of face- notied in smile and eyes, if very tired   Brain cancer ([name])    Complication of anesthesia    N/V   Essential hypertension    External hemorrhoids    History of blood transfusion    post op Hemorroids   History of pneumonia    Hyperlipidemia    Hyperthyroidism    hx 58 years old- oral meds   Iron deficiency anemia    Migraine headache    Pneumonia    x 2 - years ago   PONV (postoperative nausea and vomiting)    PPD positive    Pulmonary nodules    not seen on plain cxr, first detected 1/07 re ct 10/08 pos IPPD > 15 mm 12/09 FOB/lavage 04/20/08 neg afb smear   PVC's (premature ventricular contractions)    Seizure ([name]) 11/15/2020   Sleep apnea    refused CPAP- mouth piece recommended   Tuberculosis    in her late 30's   Past Surgical History:  Procedure Laterality Date   ABDOMINAL HYSTERECTOMY     APPLICATION OF CRANIAL NAVIGATION N/A 11/21/2020   Procedure: APPLICATION OF CRANIAL NAVIGATION;  Surgeon: Cheryle Debby LABOR, MD;  Location: [facility] OR;  Service: Neurosurgery;  Laterality: N/A;   BREAST BIOPSY Right 2014   BREAST BIOPSY Left 10/29/2019   CHOLECYSTECTOMY  2014   COLONOSCOPY  05/04/2009   prolapsing external hemorrhoids   CRANIOTOMY Left 11/21/2020   Procedure: Left craniotomy for tumor resection;  Surgeon: Cheryle Debby LABOR, MD;  Location[facility] OR;  Service: Neurosurgery;  Laterality: Left;   ENDOMETRIAL ABLATION     HEMORRHOID SURGERY     x 3   TUBAL LIGATION     Patient Active Problem List   Diagnosis Date Noted   Carcinoma of head of pancreas ([name]) 10/05/2023   Seizure disorder ([name]) 10/05/2023   Generalized seizure disorder ([name])  10/05/2023   Neutropenia ([name]) 10/18/2021   Genetic testing 08/24/2021   Family history of colon cancer 08/03/2021   Pancreatic mass 07/31/2021   S/P craniotomy 11/21/2020   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) 11/21/2020   Focal seizures ([name]) 11/15/2020   OSA (obstructive sleep apnea) 06/13/2017   Hypothyroid 06/26/2015   Brachial neuritis 03/29/2015   Rotator cuff tear 12/21/2014   Abdominal pain, right upper quadrant 03/14/2014   Leg cramps 07/15/2013   Acquired anal stenosis 06/18/2011   Chronic constipation 06/18/2011   PVC's (premature ventricular contractions) 05/23/2011   Headache, migraine, intractable 05/09/2011   Asthma exacerbation, allergic 07/31/2010   POSITIVE PPD 05/04/2008   Hyperlipidemia 02/05/2008   Essential hypertension 02/05/2008   Pulmonary nodules 02/05/2008   Chronic cough 02/05/2008    ONSET DATE: March 2025  REFERRING DIAG: G40.309 (ICD-10-CM) - Generalized seizure disorder ([name]) C71.9 (ICD-10-CM) - Glioblastoma with isocitrate dehydrogenase gene wildtype  THERAPY DIAG:  No diagnosis found.  Rationale for Evaluation and Treatment: Rehabilitation  SUBJECTIVE:  SUBJECTIVE STATEMENT: ***  From eval: Hx of glioblastoma and seizure disorder with onset of increased difficulty with mobility, balance, and risk for falls requiring increased need for caregiver support since March 2025.  Has not been using AD and has been independent household levels and HHA/SBA when ambulating outdoors.  Pt accompanied by: family member  PERTINENT HISTORY: glioblastoma s/p craniotomy in 2022. Seizure disorder  PAIN:  Are you having pain? No obvious pain responses appreciated and denies  PRECAUTIONS: Fall  RED FLAGS: None   WEIGHT BEARING RESTRICTIONS:  No  FALLS: Has patient fallen in last 6 months? Yes. Number of falls 2 when experienced seizure  LIVING ENVIRONMENT: Lives with: lives with their family and lives with their daughter Lives in: House/apartment Stairs: 3 STE w/ BHR,  Has following equipment at home: reports no use of AD previously  PLOF: was doing most on her own until March 2025, was active/involved in church activities  PATIENT GOALS: improve mobility, balance, activity  OBJECTIVE:  Note: Objective measures were completed at Evaluation unless otherwise noted.  TREATMENT DATE:12/03/2023 Activity Comments  ***            PATIENT EDUCATION: Education details: assessment details, rationale of PT intervention and HEP initiation Person educated: Patient and Child(ren) Education method: Explanation and Handouts Education comprehension: verbalized understanding  HOME EXERCISE PROGRAM: Access Code: [email or web address] URL: [email or web address] Date: 11/10/2023 Prepared by: Kelly Halpin  Exercises - Supine Ankle Pumps  - 1 x daily - 7 x weekly - 3 sets - 10 reps - Seated Toe Raise  - 1 x daily - 7 x weekly - 3 sets - 10 reps - Seated Long Arc Quad  - 1 x daily - 7 x weekly - 3 sets - 10 reps - 2 sec hold - Sit to Stand with Armchair  - 1 x daily - 7 x weekly - 3 sets - 5 reps  ________________________________________________________________  DIAGNOSTIC FINDINGS:  IMPRESSION: Interval mildly increased enhancement and T2 hyperintensity in the left inferior frontal gyrus. Findings could represent continued evolution of treatment change or slow progression of disease. Recommend continued imaging follow-up.  COGNITION: Overall cognitive status: History of cognitive impairments - at baseline   SENSATION: Not tested  COORDINATION: Deficits to rapid alternating movement RUE/RLE Heel to shin impaired RLE   EDEMA:  None noted  MUSCLE TONE: hypotonia RLE?     POSTURE: No Significant postural  limitations  LOWER EXTREMITY ROM:     Active  Right Eval Left Eval  Hip flexion    Hip extension    Hip abduction    Hip adduction    Hip internal rotation    Hip external rotation    Knee flexion 120 120  Knee extension 0 0  Ankle dorsiflexion 5 15  Ankle plantarflexion    Ankle inversion    Ankle eversion     (Blank rows = not tested)  LOWER EXTREMITY MMT:    MMT Right Eval Left Eval  Hip flexion 3 5  Hip extension    Hip abduction 3- 4  Hip adduction 3- 4  Hip internal rotation    Hip external rotation    Knee flexion 3+ 5  Knee extension 3+ 5  Ankle dorsiflexion 3- 5  Ankle plantarflexion 2+ 4  Ankle inversion    Ankle eversion    (Blank rows = not tested)  BED MOBILITY:  Not tested  TRANSFERS: Sit to stand modified indep Chair to chair: modified indep.    CURB:  Findings: CGA  STAIRS: CGA w/ BHR GAIT: Findings: Comments: decreased stride, right foot drop/drag, decreased foot clearance, decreased speed  FUNCTIONAL TESTS:  5 times sit to stand: 20 sec w/ UE support Timed up and go (TUG): 30 sec 2 minute walk test: TBD Berg Balance Test: 40/56 Romberg: WFL 10 meter walk: 33.84 sec = 0.97 ft/sec    GOALS: Goals reviewed with patient? Yes  SHORT TERM GOALS: Target date: 12/01/2023    Patient will perform HEP with family/caregiver supervision for improved strength, balance, transfers, and gait  Baseline: Goal status: INITIAL  2.  Patient will achieve 15 seconds for TUG test to manifest reduced risk for falls Baseline: 30 sec w/out AD Goal status: INITIAL  3.  Ambulate stairs w/ BHR and supervision to improve safety with entering/exiting home Baseline: BHR and CGA Goal status: INITIAL    LONG TERM GOALS: Target date: 12/22/2023    Patient will be independent in HEP to improve functional outcomes Baseline:  Goal status: INITIAL  2.  Demo improved balance and lower risk for falls per score 48/56 Berg Balance Test Baseline:  40/56 Goal status: INITIAL  3.  Patient will improve gait velocity to at least 1.8 ft/sec for improved gait efficiency and safety Baseline: 0.97 ft/sec Goal status: INITIAL  4.  Demo improved BLE strength and reduced risk for falls per time 15 sec 5xSTS test Baseline: 20 sec w/ UE support Goal status: INITIAL  5.  Ambulate outdoor surfaces w/ supervision using least restrictive AD to improve safety with mobility Baseline: CGA/HHA Goal status: INITIAL    ASSESSMENT:  CLINICAL IMPRESSION: ***  From eval: Patient is a 58 y.o. lady who was seen today for physical therapy evaluation and treatment for G40.309 (ICD-10-CM) - Generalized seizure disorder ([name]) C71.9 (ICD-10-CM) - Glioblastoma with isocitrate dehydrogenase gene wildtype.  Exhibits general right side weakness and impairments to coordination, strength, power affecting functional mobility.  Exhibits high risk for falls per outcome measures and gait deviations w/ decreased right foot clearance in swing and foot drop/slap in initial contact.  Decreased awareness is also noted requiring global cues for sequence and execution of movements.  Pt would benefit from ongoing PT services to provide relevant intervention strategies, compensatory and adaptive strategies to improve mobility and reduce risk for falls.    OBJECTIVE IMPAIRMENTS: Abnormal gait, decreased activity tolerance, decreased balance, decreased coordination, decreased endurance, decreased knowledge of use of DME, decreased mobility, difficulty walking, decreased ROM, and decreased strength.   ACTIVITY LIMITATIONS: carrying, lifting, bending, squatting, stairs, transfers, reach over head, and locomotion level  PARTICIPATION LIMITATIONS: meal prep, cleaning, laundry, interpersonal relationship, community activity, occupation, and church  PERSONAL FACTORS: Age, Behavior pattern, Time since onset of injury/illness/exacerbation, and 1-2 comorbidities: PMH are also affecting  patient's functional outcome.   REHAB POTENTIAL: Good  CLINICAL DECISION MAKING: Unstable/unpredictable  EVALUATION COMPLEXITY: High  PLAN:  PT FREQUENCY: 1-2x/week  PT DURATION: 6 weeks  PLANNED INTERVENTIONS: 97750- Physical Performance Testing, 97110-Therapeutic exercises, 97530- Therapeutic activity, [id number]- Neuromuscular re-education, 97535- Self Care, 02859- Manual therapy, [id number]- Gait training, [phone]- Orthotic Initial, [id number]- Orthotic/Prosthetic subsequent, [phone]- Canalith repositioning, and [phone]- Aquatic Therapy  PLAN FOR NEXT SESSION: HEP review, foot-up/AFO if shoes appropriate, trial of various AD   8:02 AM, 12/03/23 Danae Oland April Ma L Analayah Brooke, PT, DPT Physical Therapist- [place] Office Number: [phone]

## 2023-12-04 ENCOUNTER — Telehealth: Payer: Self-pay | Admitting: Occupational Therapy

## 2023-12-04 NOTE — Telephone Encounter (Signed)
 Attempted to phone pt about missed appt Weds 7/30 but no answer at home phone number.  Left HIPAA compliant message, encouraging a call back if/when message received.

## 2023-12-05 ENCOUNTER — Telehealth: Payer: Self-pay | Admitting: Physical Therapy

## 2023-12-05 ENCOUNTER — Ambulatory Visit: Attending: Internal Medicine | Admitting: Physical Therapy

## 2023-12-05 DIAGNOSIS — C729 Malignant neoplasm of central nervous system, unspecified: Secondary | ICD-10-CM | POA: Diagnosis not present

## 2023-12-05 DIAGNOSIS — E876 Hypokalemia: Secondary | ICD-10-CM | POA: Diagnosis not present

## 2023-12-05 DIAGNOSIS — E7849 Other hyperlipidemia: Secondary | ICD-10-CM | POA: Diagnosis not present

## 2023-12-05 DIAGNOSIS — D378 Neoplasm of uncertain behavior of other specified digestive organs: Secondary | ICD-10-CM | POA: Diagnosis not present

## 2023-12-05 DIAGNOSIS — I679 Cerebrovascular disease, unspecified: Secondary | ICD-10-CM | POA: Diagnosis not present

## 2023-12-05 DIAGNOSIS — I10 Essential (primary) hypertension: Secondary | ICD-10-CM | POA: Diagnosis not present

## 2023-12-05 DIAGNOSIS — E039 Hypothyroidism, unspecified: Secondary | ICD-10-CM | POA: Diagnosis not present

## 2023-12-05 DIAGNOSIS — G4089 Other seizures: Secondary | ICD-10-CM | POA: Diagnosis not present

## 2023-12-05 NOTE — Telephone Encounter (Signed)
 Called pt's mobile and daughter's mobile number in regards to patient's second no-show. No answer, but able to leave a voicemail to ask for a call back if they want to continue therapy services. Will plan to d/c if no call back prior to pt's next scheduled visit.   Conn Trombetta April Ma L Arnett Galindez, PT, DPT

## 2023-12-06 DIAGNOSIS — E7849 Other hyperlipidemia: Secondary | ICD-10-CM | POA: Diagnosis not present

## 2023-12-06 DIAGNOSIS — C729 Malignant neoplasm of central nervous system, unspecified: Secondary | ICD-10-CM | POA: Diagnosis not present

## 2023-12-06 DIAGNOSIS — D378 Neoplasm of uncertain behavior of other specified digestive organs: Secondary | ICD-10-CM | POA: Diagnosis not present

## 2023-12-06 DIAGNOSIS — E876 Hypokalemia: Secondary | ICD-10-CM | POA: Diagnosis not present

## 2023-12-06 DIAGNOSIS — G4089 Other seizures: Secondary | ICD-10-CM | POA: Diagnosis not present

## 2023-12-06 DIAGNOSIS — I10 Essential (primary) hypertension: Secondary | ICD-10-CM | POA: Diagnosis not present

## 2023-12-06 DIAGNOSIS — E039 Hypothyroidism, unspecified: Secondary | ICD-10-CM | POA: Diagnosis not present

## 2023-12-06 DIAGNOSIS — I679 Cerebrovascular disease, unspecified: Secondary | ICD-10-CM | POA: Diagnosis not present

## 2023-12-07 DIAGNOSIS — I10 Essential (primary) hypertension: Secondary | ICD-10-CM | POA: Diagnosis not present

## 2023-12-07 DIAGNOSIS — D378 Neoplasm of uncertain behavior of other specified digestive organs: Secondary | ICD-10-CM | POA: Diagnosis not present

## 2023-12-07 DIAGNOSIS — C729 Malignant neoplasm of central nervous system, unspecified: Secondary | ICD-10-CM | POA: Diagnosis not present

## 2023-12-07 DIAGNOSIS — E039 Hypothyroidism, unspecified: Secondary | ICD-10-CM | POA: Diagnosis not present

## 2023-12-07 DIAGNOSIS — E7849 Other hyperlipidemia: Secondary | ICD-10-CM | POA: Diagnosis not present

## 2023-12-07 DIAGNOSIS — G4089 Other seizures: Secondary | ICD-10-CM | POA: Diagnosis not present

## 2023-12-07 DIAGNOSIS — I679 Cerebrovascular disease, unspecified: Secondary | ICD-10-CM | POA: Diagnosis not present

## 2023-12-07 DIAGNOSIS — E876 Hypokalemia: Secondary | ICD-10-CM | POA: Diagnosis not present

## 2023-12-08 DIAGNOSIS — I10 Essential (primary) hypertension: Secondary | ICD-10-CM | POA: Diagnosis not present

## 2023-12-08 DIAGNOSIS — E039 Hypothyroidism, unspecified: Secondary | ICD-10-CM | POA: Diagnosis not present

## 2023-12-08 DIAGNOSIS — E876 Hypokalemia: Secondary | ICD-10-CM | POA: Diagnosis not present

## 2023-12-08 DIAGNOSIS — D378 Neoplasm of uncertain behavior of other specified digestive organs: Secondary | ICD-10-CM | POA: Diagnosis not present

## 2023-12-08 DIAGNOSIS — E7849 Other hyperlipidemia: Secondary | ICD-10-CM | POA: Diagnosis not present

## 2023-12-08 DIAGNOSIS — I679 Cerebrovascular disease, unspecified: Secondary | ICD-10-CM | POA: Diagnosis not present

## 2023-12-08 DIAGNOSIS — G4089 Other seizures: Secondary | ICD-10-CM | POA: Diagnosis not present

## 2023-12-08 DIAGNOSIS — C729 Malignant neoplasm of central nervous system, unspecified: Secondary | ICD-10-CM | POA: Diagnosis not present

## 2023-12-09 DIAGNOSIS — I679 Cerebrovascular disease, unspecified: Secondary | ICD-10-CM | POA: Diagnosis not present

## 2023-12-09 DIAGNOSIS — E039 Hypothyroidism, unspecified: Secondary | ICD-10-CM | POA: Diagnosis not present

## 2023-12-09 DIAGNOSIS — C729 Malignant neoplasm of central nervous system, unspecified: Secondary | ICD-10-CM | POA: Diagnosis not present

## 2023-12-09 DIAGNOSIS — G4089 Other seizures: Secondary | ICD-10-CM | POA: Diagnosis not present

## 2023-12-09 DIAGNOSIS — I10 Essential (primary) hypertension: Secondary | ICD-10-CM | POA: Diagnosis not present

## 2023-12-09 DIAGNOSIS — E876 Hypokalemia: Secondary | ICD-10-CM | POA: Diagnosis not present

## 2023-12-09 DIAGNOSIS — D378 Neoplasm of uncertain behavior of other specified digestive organs: Secondary | ICD-10-CM | POA: Diagnosis not present

## 2023-12-09 DIAGNOSIS — E7849 Other hyperlipidemia: Secondary | ICD-10-CM | POA: Diagnosis not present

## 2023-12-10 DIAGNOSIS — G4089 Other seizures: Secondary | ICD-10-CM | POA: Diagnosis not present

## 2023-12-10 DIAGNOSIS — C729 Malignant neoplasm of central nervous system, unspecified: Secondary | ICD-10-CM | POA: Diagnosis not present

## 2023-12-10 DIAGNOSIS — I10 Essential (primary) hypertension: Secondary | ICD-10-CM | POA: Diagnosis not present

## 2023-12-10 DIAGNOSIS — E876 Hypokalemia: Secondary | ICD-10-CM | POA: Diagnosis not present

## 2023-12-10 DIAGNOSIS — I679 Cerebrovascular disease, unspecified: Secondary | ICD-10-CM | POA: Diagnosis not present

## 2023-12-10 DIAGNOSIS — E039 Hypothyroidism, unspecified: Secondary | ICD-10-CM | POA: Diagnosis not present

## 2023-12-10 DIAGNOSIS — E7849 Other hyperlipidemia: Secondary | ICD-10-CM | POA: Diagnosis not present

## 2023-12-10 DIAGNOSIS — D378 Neoplasm of uncertain behavior of other specified digestive organs: Secondary | ICD-10-CM | POA: Diagnosis not present

## 2023-12-11 DIAGNOSIS — G4089 Other seizures: Secondary | ICD-10-CM | POA: Diagnosis not present

## 2023-12-11 DIAGNOSIS — E7849 Other hyperlipidemia: Secondary | ICD-10-CM | POA: Diagnosis not present

## 2023-12-11 DIAGNOSIS — E876 Hypokalemia: Secondary | ICD-10-CM | POA: Diagnosis not present

## 2023-12-11 DIAGNOSIS — I679 Cerebrovascular disease, unspecified: Secondary | ICD-10-CM | POA: Diagnosis not present

## 2023-12-11 DIAGNOSIS — C729 Malignant neoplasm of central nervous system, unspecified: Secondary | ICD-10-CM | POA: Diagnosis not present

## 2023-12-11 DIAGNOSIS — D378 Neoplasm of uncertain behavior of other specified digestive organs: Secondary | ICD-10-CM | POA: Diagnosis not present

## 2023-12-11 DIAGNOSIS — E039 Hypothyroidism, unspecified: Secondary | ICD-10-CM | POA: Diagnosis not present

## 2023-12-11 DIAGNOSIS — I10 Essential (primary) hypertension: Secondary | ICD-10-CM | POA: Diagnosis not present

## 2023-12-12 ENCOUNTER — Ambulatory Visit: Admitting: Occupational Therapy

## 2023-12-12 ENCOUNTER — Ambulatory Visit

## 2023-12-12 DIAGNOSIS — G4089 Other seizures: Secondary | ICD-10-CM | POA: Diagnosis not present

## 2023-12-12 DIAGNOSIS — E039 Hypothyroidism, unspecified: Secondary | ICD-10-CM | POA: Diagnosis not present

## 2023-12-12 DIAGNOSIS — E7849 Other hyperlipidemia: Secondary | ICD-10-CM | POA: Diagnosis not present

## 2023-12-12 DIAGNOSIS — I679 Cerebrovascular disease, unspecified: Secondary | ICD-10-CM | POA: Diagnosis not present

## 2023-12-12 DIAGNOSIS — C729 Malignant neoplasm of central nervous system, unspecified: Secondary | ICD-10-CM | POA: Diagnosis not present

## 2023-12-12 DIAGNOSIS — D378 Neoplasm of uncertain behavior of other specified digestive organs: Secondary | ICD-10-CM | POA: Diagnosis not present

## 2023-12-12 DIAGNOSIS — I10 Essential (primary) hypertension: Secondary | ICD-10-CM | POA: Diagnosis not present

## 2023-12-12 DIAGNOSIS — E876 Hypokalemia: Secondary | ICD-10-CM | POA: Diagnosis not present

## 2023-12-13 DIAGNOSIS — E7849 Other hyperlipidemia: Secondary | ICD-10-CM | POA: Diagnosis not present

## 2023-12-13 DIAGNOSIS — G4089 Other seizures: Secondary | ICD-10-CM | POA: Diagnosis not present

## 2023-12-13 DIAGNOSIS — E039 Hypothyroidism, unspecified: Secondary | ICD-10-CM | POA: Diagnosis not present

## 2023-12-13 DIAGNOSIS — I679 Cerebrovascular disease, unspecified: Secondary | ICD-10-CM | POA: Diagnosis not present

## 2023-12-13 DIAGNOSIS — E876 Hypokalemia: Secondary | ICD-10-CM | POA: Diagnosis not present

## 2023-12-13 DIAGNOSIS — D378 Neoplasm of uncertain behavior of other specified digestive organs: Secondary | ICD-10-CM | POA: Diagnosis not present

## 2023-12-13 DIAGNOSIS — I10 Essential (primary) hypertension: Secondary | ICD-10-CM | POA: Diagnosis not present

## 2023-12-13 DIAGNOSIS — C729 Malignant neoplasm of central nervous system, unspecified: Secondary | ICD-10-CM | POA: Diagnosis not present

## 2023-12-14 DIAGNOSIS — G4089 Other seizures: Secondary | ICD-10-CM | POA: Diagnosis not present

## 2023-12-14 DIAGNOSIS — D378 Neoplasm of uncertain behavior of other specified digestive organs: Secondary | ICD-10-CM | POA: Diagnosis not present

## 2023-12-14 DIAGNOSIS — E039 Hypothyroidism, unspecified: Secondary | ICD-10-CM | POA: Diagnosis not present

## 2023-12-14 DIAGNOSIS — I679 Cerebrovascular disease, unspecified: Secondary | ICD-10-CM | POA: Diagnosis not present

## 2023-12-14 DIAGNOSIS — I10 Essential (primary) hypertension: Secondary | ICD-10-CM | POA: Diagnosis not present

## 2023-12-14 DIAGNOSIS — C729 Malignant neoplasm of central nervous system, unspecified: Secondary | ICD-10-CM | POA: Diagnosis not present

## 2023-12-14 DIAGNOSIS — E876 Hypokalemia: Secondary | ICD-10-CM | POA: Diagnosis not present

## 2023-12-14 DIAGNOSIS — E7849 Other hyperlipidemia: Secondary | ICD-10-CM | POA: Diagnosis not present

## 2023-12-15 ENCOUNTER — Ambulatory Visit: Admitting: Occupational Therapy

## 2023-12-15 ENCOUNTER — Ambulatory Visit

## 2023-12-15 ENCOUNTER — Ambulatory Visit: Admitting: Physical Therapy

## 2023-12-15 DIAGNOSIS — G4089 Other seizures: Secondary | ICD-10-CM | POA: Diagnosis not present

## 2023-12-15 DIAGNOSIS — I10 Essential (primary) hypertension: Secondary | ICD-10-CM | POA: Diagnosis not present

## 2023-12-15 DIAGNOSIS — E039 Hypothyroidism, unspecified: Secondary | ICD-10-CM | POA: Diagnosis not present

## 2023-12-15 DIAGNOSIS — C729 Malignant neoplasm of central nervous system, unspecified: Secondary | ICD-10-CM | POA: Diagnosis not present

## 2023-12-15 DIAGNOSIS — E7849 Other hyperlipidemia: Secondary | ICD-10-CM | POA: Diagnosis not present

## 2023-12-15 DIAGNOSIS — I679 Cerebrovascular disease, unspecified: Secondary | ICD-10-CM | POA: Diagnosis not present

## 2023-12-15 DIAGNOSIS — D378 Neoplasm of uncertain behavior of other specified digestive organs: Secondary | ICD-10-CM | POA: Diagnosis not present

## 2023-12-15 DIAGNOSIS — E876 Hypokalemia: Secondary | ICD-10-CM | POA: Diagnosis not present

## 2023-12-16 DIAGNOSIS — G4089 Other seizures: Secondary | ICD-10-CM | POA: Diagnosis not present

## 2023-12-16 DIAGNOSIS — E876 Hypokalemia: Secondary | ICD-10-CM | POA: Diagnosis not present

## 2023-12-16 DIAGNOSIS — E7849 Other hyperlipidemia: Secondary | ICD-10-CM | POA: Diagnosis not present

## 2023-12-16 DIAGNOSIS — C729 Malignant neoplasm of central nervous system, unspecified: Secondary | ICD-10-CM | POA: Diagnosis not present

## 2023-12-16 DIAGNOSIS — I10 Essential (primary) hypertension: Secondary | ICD-10-CM | POA: Diagnosis not present

## 2023-12-16 DIAGNOSIS — E039 Hypothyroidism, unspecified: Secondary | ICD-10-CM | POA: Diagnosis not present

## 2023-12-16 DIAGNOSIS — D378 Neoplasm of uncertain behavior of other specified digestive organs: Secondary | ICD-10-CM | POA: Diagnosis not present

## 2023-12-16 DIAGNOSIS — I679 Cerebrovascular disease, unspecified: Secondary | ICD-10-CM | POA: Diagnosis not present

## 2023-12-17 ENCOUNTER — Ambulatory Visit

## 2023-12-17 ENCOUNTER — Ambulatory Visit: Admitting: Occupational Therapy

## 2023-12-17 DIAGNOSIS — E7849 Other hyperlipidemia: Secondary | ICD-10-CM | POA: Diagnosis not present

## 2023-12-17 DIAGNOSIS — C729 Malignant neoplasm of central nervous system, unspecified: Secondary | ICD-10-CM | POA: Diagnosis not present

## 2023-12-17 DIAGNOSIS — I679 Cerebrovascular disease, unspecified: Secondary | ICD-10-CM | POA: Diagnosis not present

## 2023-12-17 DIAGNOSIS — G4089 Other seizures: Secondary | ICD-10-CM | POA: Diagnosis not present

## 2023-12-17 DIAGNOSIS — I10 Essential (primary) hypertension: Secondary | ICD-10-CM | POA: Diagnosis not present

## 2023-12-17 DIAGNOSIS — E876 Hypokalemia: Secondary | ICD-10-CM | POA: Diagnosis not present

## 2023-12-17 DIAGNOSIS — D378 Neoplasm of uncertain behavior of other specified digestive organs: Secondary | ICD-10-CM | POA: Diagnosis not present

## 2023-12-17 DIAGNOSIS — E039 Hypothyroidism, unspecified: Secondary | ICD-10-CM | POA: Diagnosis not present

## 2023-12-18 DIAGNOSIS — D378 Neoplasm of uncertain behavior of other specified digestive organs: Secondary | ICD-10-CM | POA: Diagnosis not present

## 2023-12-18 DIAGNOSIS — E7849 Other hyperlipidemia: Secondary | ICD-10-CM | POA: Diagnosis not present

## 2023-12-18 DIAGNOSIS — E039 Hypothyroidism, unspecified: Secondary | ICD-10-CM | POA: Diagnosis not present

## 2023-12-18 DIAGNOSIS — E876 Hypokalemia: Secondary | ICD-10-CM | POA: Diagnosis not present

## 2023-12-18 DIAGNOSIS — G4089 Other seizures: Secondary | ICD-10-CM | POA: Diagnosis not present

## 2023-12-18 DIAGNOSIS — C729 Malignant neoplasm of central nervous system, unspecified: Secondary | ICD-10-CM | POA: Diagnosis not present

## 2023-12-18 DIAGNOSIS — I10 Essential (primary) hypertension: Secondary | ICD-10-CM | POA: Diagnosis not present

## 2023-12-18 DIAGNOSIS — I679 Cerebrovascular disease, unspecified: Secondary | ICD-10-CM | POA: Diagnosis not present

## 2023-12-19 DIAGNOSIS — I679 Cerebrovascular disease, unspecified: Secondary | ICD-10-CM | POA: Diagnosis not present

## 2023-12-19 DIAGNOSIS — E876 Hypokalemia: Secondary | ICD-10-CM | POA: Diagnosis not present

## 2023-12-19 DIAGNOSIS — E039 Hypothyroidism, unspecified: Secondary | ICD-10-CM | POA: Diagnosis not present

## 2023-12-19 DIAGNOSIS — D378 Neoplasm of uncertain behavior of other specified digestive organs: Secondary | ICD-10-CM | POA: Diagnosis not present

## 2023-12-19 DIAGNOSIS — C729 Malignant neoplasm of central nervous system, unspecified: Secondary | ICD-10-CM | POA: Diagnosis not present

## 2023-12-19 DIAGNOSIS — I10 Essential (primary) hypertension: Secondary | ICD-10-CM | POA: Diagnosis not present

## 2023-12-19 DIAGNOSIS — E7849 Other hyperlipidemia: Secondary | ICD-10-CM | POA: Diagnosis not present

## 2023-12-19 DIAGNOSIS — G4089 Other seizures: Secondary | ICD-10-CM | POA: Diagnosis not present

## 2023-12-20 DIAGNOSIS — E039 Hypothyroidism, unspecified: Secondary | ICD-10-CM | POA: Diagnosis not present

## 2023-12-20 DIAGNOSIS — E876 Hypokalemia: Secondary | ICD-10-CM | POA: Diagnosis not present

## 2023-12-20 DIAGNOSIS — I10 Essential (primary) hypertension: Secondary | ICD-10-CM | POA: Diagnosis not present

## 2023-12-20 DIAGNOSIS — C729 Malignant neoplasm of central nervous system, unspecified: Secondary | ICD-10-CM | POA: Diagnosis not present

## 2023-12-20 DIAGNOSIS — I679 Cerebrovascular disease, unspecified: Secondary | ICD-10-CM | POA: Diagnosis not present

## 2023-12-20 DIAGNOSIS — E7849 Other hyperlipidemia: Secondary | ICD-10-CM | POA: Diagnosis not present

## 2023-12-20 DIAGNOSIS — D378 Neoplasm of uncertain behavior of other specified digestive organs: Secondary | ICD-10-CM | POA: Diagnosis not present

## 2023-12-20 DIAGNOSIS — G4089 Other seizures: Secondary | ICD-10-CM | POA: Diagnosis not present

## 2023-12-21 DIAGNOSIS — E039 Hypothyroidism, unspecified: Secondary | ICD-10-CM | POA: Diagnosis not present

## 2023-12-21 DIAGNOSIS — E876 Hypokalemia: Secondary | ICD-10-CM | POA: Diagnosis not present

## 2023-12-21 DIAGNOSIS — G4089 Other seizures: Secondary | ICD-10-CM | POA: Diagnosis not present

## 2023-12-21 DIAGNOSIS — E7849 Other hyperlipidemia: Secondary | ICD-10-CM | POA: Diagnosis not present

## 2023-12-21 DIAGNOSIS — I679 Cerebrovascular disease, unspecified: Secondary | ICD-10-CM | POA: Diagnosis not present

## 2023-12-21 DIAGNOSIS — C729 Malignant neoplasm of central nervous system, unspecified: Secondary | ICD-10-CM | POA: Diagnosis not present

## 2023-12-21 DIAGNOSIS — I10 Essential (primary) hypertension: Secondary | ICD-10-CM | POA: Diagnosis not present

## 2023-12-21 DIAGNOSIS — D378 Neoplasm of uncertain behavior of other specified digestive organs: Secondary | ICD-10-CM | POA: Diagnosis not present

## 2023-12-22 ENCOUNTER — Ambulatory Visit

## 2023-12-22 ENCOUNTER — Encounter: Admitting: Occupational Therapy

## 2023-12-22 DIAGNOSIS — I10 Essential (primary) hypertension: Secondary | ICD-10-CM | POA: Diagnosis not present

## 2023-12-22 DIAGNOSIS — D378 Neoplasm of uncertain behavior of other specified digestive organs: Secondary | ICD-10-CM | POA: Diagnosis not present

## 2023-12-22 DIAGNOSIS — G4089 Other seizures: Secondary | ICD-10-CM | POA: Diagnosis not present

## 2023-12-22 DIAGNOSIS — I679 Cerebrovascular disease, unspecified: Secondary | ICD-10-CM | POA: Diagnosis not present

## 2023-12-22 DIAGNOSIS — E876 Hypokalemia: Secondary | ICD-10-CM | POA: Diagnosis not present

## 2023-12-22 DIAGNOSIS — E039 Hypothyroidism, unspecified: Secondary | ICD-10-CM | POA: Diagnosis not present

## 2023-12-22 DIAGNOSIS — C729 Malignant neoplasm of central nervous system, unspecified: Secondary | ICD-10-CM | POA: Diagnosis not present

## 2023-12-22 DIAGNOSIS — E7849 Other hyperlipidemia: Secondary | ICD-10-CM | POA: Diagnosis not present

## 2023-12-23 DIAGNOSIS — G4089 Other seizures: Secondary | ICD-10-CM | POA: Diagnosis not present

## 2023-12-23 DIAGNOSIS — I10 Essential (primary) hypertension: Secondary | ICD-10-CM | POA: Diagnosis not present

## 2023-12-23 DIAGNOSIS — D378 Neoplasm of uncertain behavior of other specified digestive organs: Secondary | ICD-10-CM | POA: Diagnosis not present

## 2023-12-23 DIAGNOSIS — E7849 Other hyperlipidemia: Secondary | ICD-10-CM | POA: Diagnosis not present

## 2023-12-23 DIAGNOSIS — C729 Malignant neoplasm of central nervous system, unspecified: Secondary | ICD-10-CM | POA: Diagnosis not present

## 2023-12-23 DIAGNOSIS — E876 Hypokalemia: Secondary | ICD-10-CM | POA: Diagnosis not present

## 2023-12-23 DIAGNOSIS — I679 Cerebrovascular disease, unspecified: Secondary | ICD-10-CM | POA: Diagnosis not present

## 2023-12-23 DIAGNOSIS — E039 Hypothyroidism, unspecified: Secondary | ICD-10-CM | POA: Diagnosis not present

## 2023-12-24 ENCOUNTER — Encounter: Admitting: Occupational Therapy

## 2023-12-24 ENCOUNTER — Ambulatory Visit

## 2023-12-24 DIAGNOSIS — I679 Cerebrovascular disease, unspecified: Secondary | ICD-10-CM | POA: Diagnosis not present

## 2023-12-24 DIAGNOSIS — C729 Malignant neoplasm of central nervous system, unspecified: Secondary | ICD-10-CM | POA: Diagnosis not present

## 2023-12-24 DIAGNOSIS — E039 Hypothyroidism, unspecified: Secondary | ICD-10-CM | POA: Diagnosis not present

## 2023-12-24 DIAGNOSIS — E7849 Other hyperlipidemia: Secondary | ICD-10-CM | POA: Diagnosis not present

## 2023-12-24 DIAGNOSIS — D378 Neoplasm of uncertain behavior of other specified digestive organs: Secondary | ICD-10-CM | POA: Diagnosis not present

## 2023-12-24 DIAGNOSIS — I10 Essential (primary) hypertension: Secondary | ICD-10-CM | POA: Diagnosis not present

## 2023-12-24 DIAGNOSIS — G4089 Other seizures: Secondary | ICD-10-CM | POA: Diagnosis not present

## 2023-12-24 DIAGNOSIS — E876 Hypokalemia: Secondary | ICD-10-CM | POA: Diagnosis not present

## 2023-12-25 ENCOUNTER — Other Ambulatory Visit: Payer: Self-pay | Admitting: Internal Medicine

## 2023-12-25 DIAGNOSIS — I10 Essential (primary) hypertension: Secondary | ICD-10-CM | POA: Diagnosis not present

## 2023-12-25 DIAGNOSIS — E039 Hypothyroidism, unspecified: Secondary | ICD-10-CM | POA: Diagnosis not present

## 2023-12-25 DIAGNOSIS — E7849 Other hyperlipidemia: Secondary | ICD-10-CM | POA: Diagnosis not present

## 2023-12-25 DIAGNOSIS — E876 Hypokalemia: Secondary | ICD-10-CM | POA: Diagnosis not present

## 2023-12-25 DIAGNOSIS — D378 Neoplasm of uncertain behavior of other specified digestive organs: Secondary | ICD-10-CM | POA: Diagnosis not present

## 2023-12-25 DIAGNOSIS — I679 Cerebrovascular disease, unspecified: Secondary | ICD-10-CM | POA: Diagnosis not present

## 2023-12-25 DIAGNOSIS — C719 Malignant neoplasm of brain, unspecified: Secondary | ICD-10-CM

## 2023-12-25 DIAGNOSIS — G4089 Other seizures: Secondary | ICD-10-CM | POA: Diagnosis not present

## 2023-12-25 DIAGNOSIS — C729 Malignant neoplasm of central nervous system, unspecified: Secondary | ICD-10-CM | POA: Diagnosis not present

## 2023-12-26 DIAGNOSIS — D378 Neoplasm of uncertain behavior of other specified digestive organs: Secondary | ICD-10-CM | POA: Diagnosis not present

## 2023-12-26 DIAGNOSIS — I679 Cerebrovascular disease, unspecified: Secondary | ICD-10-CM | POA: Diagnosis not present

## 2023-12-26 DIAGNOSIS — G4089 Other seizures: Secondary | ICD-10-CM | POA: Diagnosis not present

## 2023-12-26 DIAGNOSIS — E039 Hypothyroidism, unspecified: Secondary | ICD-10-CM | POA: Diagnosis not present

## 2023-12-26 DIAGNOSIS — E876 Hypokalemia: Secondary | ICD-10-CM | POA: Diagnosis not present

## 2023-12-26 DIAGNOSIS — I10 Essential (primary) hypertension: Secondary | ICD-10-CM | POA: Diagnosis not present

## 2023-12-26 DIAGNOSIS — C729 Malignant neoplasm of central nervous system, unspecified: Secondary | ICD-10-CM | POA: Diagnosis not present

## 2023-12-26 DIAGNOSIS — E7849 Other hyperlipidemia: Secondary | ICD-10-CM | POA: Diagnosis not present

## 2023-12-27 DIAGNOSIS — C729 Malignant neoplasm of central nervous system, unspecified: Secondary | ICD-10-CM | POA: Diagnosis not present

## 2023-12-27 DIAGNOSIS — I679 Cerebrovascular disease, unspecified: Secondary | ICD-10-CM | POA: Diagnosis not present

## 2023-12-27 DIAGNOSIS — G4089 Other seizures: Secondary | ICD-10-CM | POA: Diagnosis not present

## 2023-12-27 DIAGNOSIS — E876 Hypokalemia: Secondary | ICD-10-CM | POA: Diagnosis not present

## 2023-12-27 DIAGNOSIS — I10 Essential (primary) hypertension: Secondary | ICD-10-CM | POA: Diagnosis not present

## 2023-12-27 DIAGNOSIS — E039 Hypothyroidism, unspecified: Secondary | ICD-10-CM | POA: Diagnosis not present

## 2023-12-27 DIAGNOSIS — E7849 Other hyperlipidemia: Secondary | ICD-10-CM | POA: Diagnosis not present

## 2023-12-27 DIAGNOSIS — D378 Neoplasm of uncertain behavior of other specified digestive organs: Secondary | ICD-10-CM | POA: Diagnosis not present

## 2023-12-28 DIAGNOSIS — C729 Malignant neoplasm of central nervous system, unspecified: Secondary | ICD-10-CM | POA: Diagnosis not present

## 2023-12-28 DIAGNOSIS — E039 Hypothyroidism, unspecified: Secondary | ICD-10-CM | POA: Diagnosis not present

## 2023-12-28 DIAGNOSIS — G4089 Other seizures: Secondary | ICD-10-CM | POA: Diagnosis not present

## 2023-12-28 DIAGNOSIS — D378 Neoplasm of uncertain behavior of other specified digestive organs: Secondary | ICD-10-CM | POA: Diagnosis not present

## 2023-12-28 DIAGNOSIS — E7849 Other hyperlipidemia: Secondary | ICD-10-CM | POA: Diagnosis not present

## 2023-12-28 DIAGNOSIS — I679 Cerebrovascular disease, unspecified: Secondary | ICD-10-CM | POA: Diagnosis not present

## 2023-12-28 DIAGNOSIS — E876 Hypokalemia: Secondary | ICD-10-CM | POA: Diagnosis not present

## 2023-12-28 DIAGNOSIS — I10 Essential (primary) hypertension: Secondary | ICD-10-CM | POA: Diagnosis not present

## 2023-12-29 ENCOUNTER — Ambulatory Visit

## 2023-12-29 ENCOUNTER — Encounter: Admitting: Occupational Therapy

## 2023-12-29 DIAGNOSIS — I679 Cerebrovascular disease, unspecified: Secondary | ICD-10-CM | POA: Diagnosis not present

## 2023-12-29 DIAGNOSIS — E039 Hypothyroidism, unspecified: Secondary | ICD-10-CM | POA: Diagnosis not present

## 2023-12-29 DIAGNOSIS — G4089 Other seizures: Secondary | ICD-10-CM | POA: Diagnosis not present

## 2023-12-29 DIAGNOSIS — D378 Neoplasm of uncertain behavior of other specified digestive organs: Secondary | ICD-10-CM | POA: Diagnosis not present

## 2023-12-29 DIAGNOSIS — E876 Hypokalemia: Secondary | ICD-10-CM | POA: Diagnosis not present

## 2023-12-29 DIAGNOSIS — E7849 Other hyperlipidemia: Secondary | ICD-10-CM | POA: Diagnosis not present

## 2023-12-29 DIAGNOSIS — C729 Malignant neoplasm of central nervous system, unspecified: Secondary | ICD-10-CM | POA: Diagnosis not present

## 2023-12-29 DIAGNOSIS — I10 Essential (primary) hypertension: Secondary | ICD-10-CM | POA: Diagnosis not present

## 2023-12-30 DIAGNOSIS — I679 Cerebrovascular disease, unspecified: Secondary | ICD-10-CM | POA: Diagnosis not present

## 2023-12-30 DIAGNOSIS — E876 Hypokalemia: Secondary | ICD-10-CM | POA: Diagnosis not present

## 2023-12-30 DIAGNOSIS — E7849 Other hyperlipidemia: Secondary | ICD-10-CM | POA: Diagnosis not present

## 2023-12-30 DIAGNOSIS — I10 Essential (primary) hypertension: Secondary | ICD-10-CM | POA: Diagnosis not present

## 2023-12-30 DIAGNOSIS — G4089 Other seizures: Secondary | ICD-10-CM | POA: Diagnosis not present

## 2023-12-30 DIAGNOSIS — E039 Hypothyroidism, unspecified: Secondary | ICD-10-CM | POA: Diagnosis not present

## 2023-12-30 DIAGNOSIS — D378 Neoplasm of uncertain behavior of other specified digestive organs: Secondary | ICD-10-CM | POA: Diagnosis not present

## 2023-12-30 DIAGNOSIS — C729 Malignant neoplasm of central nervous system, unspecified: Secondary | ICD-10-CM | POA: Diagnosis not present

## 2023-12-31 ENCOUNTER — Ambulatory Visit

## 2023-12-31 ENCOUNTER — Encounter: Admitting: Occupational Therapy

## 2023-12-31 DIAGNOSIS — D378 Neoplasm of uncertain behavior of other specified digestive organs: Secondary | ICD-10-CM | POA: Diagnosis not present

## 2023-12-31 DIAGNOSIS — E876 Hypokalemia: Secondary | ICD-10-CM | POA: Diagnosis not present

## 2023-12-31 DIAGNOSIS — G4089 Other seizures: Secondary | ICD-10-CM | POA: Diagnosis not present

## 2023-12-31 DIAGNOSIS — C729 Malignant neoplasm of central nervous system, unspecified: Secondary | ICD-10-CM | POA: Diagnosis not present

## 2023-12-31 DIAGNOSIS — I10 Essential (primary) hypertension: Secondary | ICD-10-CM | POA: Diagnosis not present

## 2023-12-31 DIAGNOSIS — I679 Cerebrovascular disease, unspecified: Secondary | ICD-10-CM | POA: Diagnosis not present

## 2023-12-31 DIAGNOSIS — E7849 Other hyperlipidemia: Secondary | ICD-10-CM | POA: Diagnosis not present

## 2023-12-31 DIAGNOSIS — E039 Hypothyroidism, unspecified: Secondary | ICD-10-CM | POA: Diagnosis not present

## 2024-01-01 DIAGNOSIS — G4089 Other seizures: Secondary | ICD-10-CM | POA: Diagnosis not present

## 2024-01-01 DIAGNOSIS — I10 Essential (primary) hypertension: Secondary | ICD-10-CM | POA: Diagnosis not present

## 2024-01-01 DIAGNOSIS — E039 Hypothyroidism, unspecified: Secondary | ICD-10-CM | POA: Diagnosis not present

## 2024-01-01 DIAGNOSIS — D378 Neoplasm of uncertain behavior of other specified digestive organs: Secondary | ICD-10-CM | POA: Diagnosis not present

## 2024-01-01 DIAGNOSIS — E876 Hypokalemia: Secondary | ICD-10-CM | POA: Diagnosis not present

## 2024-01-01 DIAGNOSIS — C729 Malignant neoplasm of central nervous system, unspecified: Secondary | ICD-10-CM | POA: Diagnosis not present

## 2024-01-01 DIAGNOSIS — I679 Cerebrovascular disease, unspecified: Secondary | ICD-10-CM | POA: Diagnosis not present

## 2024-01-01 DIAGNOSIS — E7849 Other hyperlipidemia: Secondary | ICD-10-CM | POA: Diagnosis not present

## 2024-01-02 DIAGNOSIS — I679 Cerebrovascular disease, unspecified: Secondary | ICD-10-CM | POA: Diagnosis not present

## 2024-01-02 DIAGNOSIS — G4089 Other seizures: Secondary | ICD-10-CM | POA: Diagnosis not present

## 2024-01-02 DIAGNOSIS — E7849 Other hyperlipidemia: Secondary | ICD-10-CM | POA: Diagnosis not present

## 2024-01-02 DIAGNOSIS — E876 Hypokalemia: Secondary | ICD-10-CM | POA: Diagnosis not present

## 2024-01-02 DIAGNOSIS — C729 Malignant neoplasm of central nervous system, unspecified: Secondary | ICD-10-CM | POA: Diagnosis not present

## 2024-01-02 DIAGNOSIS — I10 Essential (primary) hypertension: Secondary | ICD-10-CM | POA: Diagnosis not present

## 2024-01-02 DIAGNOSIS — E039 Hypothyroidism, unspecified: Secondary | ICD-10-CM | POA: Diagnosis not present

## 2024-01-02 DIAGNOSIS — D378 Neoplasm of uncertain behavior of other specified digestive organs: Secondary | ICD-10-CM | POA: Diagnosis not present

## 2024-01-03 ENCOUNTER — Other Ambulatory Visit: Payer: Self-pay | Admitting: Internal Medicine

## 2024-01-03 DIAGNOSIS — C729 Malignant neoplasm of central nervous system, unspecified: Secondary | ICD-10-CM | POA: Diagnosis not present

## 2024-01-03 DIAGNOSIS — E039 Hypothyroidism, unspecified: Secondary | ICD-10-CM | POA: Diagnosis not present

## 2024-01-03 DIAGNOSIS — I679 Cerebrovascular disease, unspecified: Secondary | ICD-10-CM | POA: Diagnosis not present

## 2024-01-03 DIAGNOSIS — E876 Hypokalemia: Secondary | ICD-10-CM | POA: Diagnosis not present

## 2024-01-03 DIAGNOSIS — I10 Essential (primary) hypertension: Secondary | ICD-10-CM | POA: Diagnosis not present

## 2024-01-03 DIAGNOSIS — E7849 Other hyperlipidemia: Secondary | ICD-10-CM | POA: Diagnosis not present

## 2024-01-03 DIAGNOSIS — G4089 Other seizures: Secondary | ICD-10-CM | POA: Diagnosis not present

## 2024-01-03 DIAGNOSIS — D378 Neoplasm of uncertain behavior of other specified digestive organs: Secondary | ICD-10-CM | POA: Diagnosis not present

## 2024-01-04 DIAGNOSIS — E039 Hypothyroidism, unspecified: Secondary | ICD-10-CM | POA: Diagnosis not present

## 2024-01-04 DIAGNOSIS — E876 Hypokalemia: Secondary | ICD-10-CM | POA: Diagnosis not present

## 2024-01-04 DIAGNOSIS — D378 Neoplasm of uncertain behavior of other specified digestive organs: Secondary | ICD-10-CM | POA: Diagnosis not present

## 2024-01-04 DIAGNOSIS — I679 Cerebrovascular disease, unspecified: Secondary | ICD-10-CM | POA: Diagnosis not present

## 2024-01-04 DIAGNOSIS — G4089 Other seizures: Secondary | ICD-10-CM | POA: Diagnosis not present

## 2024-01-04 DIAGNOSIS — C729 Malignant neoplasm of central nervous system, unspecified: Secondary | ICD-10-CM | POA: Diagnosis not present

## 2024-01-04 DIAGNOSIS — I10 Essential (primary) hypertension: Secondary | ICD-10-CM | POA: Diagnosis not present

## 2024-01-04 DIAGNOSIS — E7849 Other hyperlipidemia: Secondary | ICD-10-CM | POA: Diagnosis not present

## 2024-01-05 DIAGNOSIS — G4089 Other seizures: Secondary | ICD-10-CM | POA: Diagnosis not present

## 2024-01-05 DIAGNOSIS — E039 Hypothyroidism, unspecified: Secondary | ICD-10-CM | POA: Diagnosis not present

## 2024-01-05 DIAGNOSIS — I679 Cerebrovascular disease, unspecified: Secondary | ICD-10-CM | POA: Diagnosis not present

## 2024-01-05 DIAGNOSIS — D378 Neoplasm of uncertain behavior of other specified digestive organs: Secondary | ICD-10-CM | POA: Diagnosis not present

## 2024-01-05 DIAGNOSIS — C729 Malignant neoplasm of central nervous system, unspecified: Secondary | ICD-10-CM | POA: Diagnosis not present

## 2024-01-05 DIAGNOSIS — E876 Hypokalemia: Secondary | ICD-10-CM | POA: Diagnosis not present

## 2024-01-05 DIAGNOSIS — E7849 Other hyperlipidemia: Secondary | ICD-10-CM | POA: Diagnosis not present

## 2024-01-05 DIAGNOSIS — I10 Essential (primary) hypertension: Secondary | ICD-10-CM | POA: Diagnosis not present

## 2024-01-06 ENCOUNTER — Encounter: Payer: Self-pay | Admitting: Internal Medicine

## 2024-01-06 DIAGNOSIS — C729 Malignant neoplasm of central nervous system, unspecified: Secondary | ICD-10-CM | POA: Diagnosis not present

## 2024-01-06 DIAGNOSIS — D378 Neoplasm of uncertain behavior of other specified digestive organs: Secondary | ICD-10-CM | POA: Diagnosis not present

## 2024-01-06 DIAGNOSIS — E7849 Other hyperlipidemia: Secondary | ICD-10-CM | POA: Diagnosis not present

## 2024-01-06 DIAGNOSIS — I10 Essential (primary) hypertension: Secondary | ICD-10-CM | POA: Diagnosis not present

## 2024-01-06 DIAGNOSIS — G4089 Other seizures: Secondary | ICD-10-CM | POA: Diagnosis not present

## 2024-01-06 DIAGNOSIS — E876 Hypokalemia: Secondary | ICD-10-CM | POA: Diagnosis not present

## 2024-01-06 DIAGNOSIS — I679 Cerebrovascular disease, unspecified: Secondary | ICD-10-CM | POA: Diagnosis not present

## 2024-01-06 DIAGNOSIS — E039 Hypothyroidism, unspecified: Secondary | ICD-10-CM | POA: Diagnosis not present

## 2024-01-07 DIAGNOSIS — E876 Hypokalemia: Secondary | ICD-10-CM | POA: Diagnosis not present

## 2024-01-07 DIAGNOSIS — E7849 Other hyperlipidemia: Secondary | ICD-10-CM | POA: Diagnosis not present

## 2024-01-07 DIAGNOSIS — D378 Neoplasm of uncertain behavior of other specified digestive organs: Secondary | ICD-10-CM | POA: Diagnosis not present

## 2024-01-07 DIAGNOSIS — I10 Essential (primary) hypertension: Secondary | ICD-10-CM | POA: Diagnosis not present

## 2024-01-07 DIAGNOSIS — E039 Hypothyroidism, unspecified: Secondary | ICD-10-CM | POA: Diagnosis not present

## 2024-01-07 DIAGNOSIS — C729 Malignant neoplasm of central nervous system, unspecified: Secondary | ICD-10-CM | POA: Diagnosis not present

## 2024-01-07 DIAGNOSIS — I679 Cerebrovascular disease, unspecified: Secondary | ICD-10-CM | POA: Diagnosis not present

## 2024-01-07 DIAGNOSIS — G4089 Other seizures: Secondary | ICD-10-CM | POA: Diagnosis not present

## 2024-01-08 DIAGNOSIS — I10 Essential (primary) hypertension: Secondary | ICD-10-CM | POA: Diagnosis not present

## 2024-01-08 DIAGNOSIS — D378 Neoplasm of uncertain behavior of other specified digestive organs: Secondary | ICD-10-CM | POA: Diagnosis not present

## 2024-01-08 DIAGNOSIS — E7849 Other hyperlipidemia: Secondary | ICD-10-CM | POA: Diagnosis not present

## 2024-01-08 DIAGNOSIS — G4089 Other seizures: Secondary | ICD-10-CM | POA: Diagnosis not present

## 2024-01-08 DIAGNOSIS — C729 Malignant neoplasm of central nervous system, unspecified: Secondary | ICD-10-CM | POA: Diagnosis not present

## 2024-01-08 DIAGNOSIS — E876 Hypokalemia: Secondary | ICD-10-CM | POA: Diagnosis not present

## 2024-01-08 DIAGNOSIS — I679 Cerebrovascular disease, unspecified: Secondary | ICD-10-CM | POA: Diagnosis not present

## 2024-01-08 DIAGNOSIS — E039 Hypothyroidism, unspecified: Secondary | ICD-10-CM | POA: Diagnosis not present

## 2024-01-09 DIAGNOSIS — I10 Essential (primary) hypertension: Secondary | ICD-10-CM | POA: Diagnosis not present

## 2024-01-09 DIAGNOSIS — E7849 Other hyperlipidemia: Secondary | ICD-10-CM | POA: Diagnosis not present

## 2024-01-09 DIAGNOSIS — E876 Hypokalemia: Secondary | ICD-10-CM | POA: Diagnosis not present

## 2024-01-09 DIAGNOSIS — D378 Neoplasm of uncertain behavior of other specified digestive organs: Secondary | ICD-10-CM | POA: Diagnosis not present

## 2024-01-09 DIAGNOSIS — E039 Hypothyroidism, unspecified: Secondary | ICD-10-CM | POA: Diagnosis not present

## 2024-01-09 DIAGNOSIS — I679 Cerebrovascular disease, unspecified: Secondary | ICD-10-CM | POA: Diagnosis not present

## 2024-01-09 DIAGNOSIS — G4089 Other seizures: Secondary | ICD-10-CM | POA: Diagnosis not present

## 2024-01-10 DIAGNOSIS — E039 Hypothyroidism, unspecified: Secondary | ICD-10-CM | POA: Diagnosis not present

## 2024-01-10 DIAGNOSIS — E876 Hypokalemia: Secondary | ICD-10-CM | POA: Diagnosis not present

## 2024-01-10 DIAGNOSIS — G4089 Other seizures: Secondary | ICD-10-CM | POA: Diagnosis not present

## 2024-01-10 DIAGNOSIS — I679 Cerebrovascular disease, unspecified: Secondary | ICD-10-CM | POA: Diagnosis not present

## 2024-01-10 DIAGNOSIS — C729 Malignant neoplasm of central nervous system, unspecified: Secondary | ICD-10-CM | POA: Diagnosis not present

## 2024-01-10 DIAGNOSIS — D378 Neoplasm of uncertain behavior of other specified digestive organs: Secondary | ICD-10-CM | POA: Diagnosis not present

## 2024-01-10 DIAGNOSIS — I10 Essential (primary) hypertension: Secondary | ICD-10-CM | POA: Diagnosis not present

## 2024-01-10 DIAGNOSIS — E7849 Other hyperlipidemia: Secondary | ICD-10-CM | POA: Diagnosis not present

## 2024-01-11 DIAGNOSIS — E039 Hypothyroidism, unspecified: Secondary | ICD-10-CM | POA: Diagnosis not present

## 2024-01-11 DIAGNOSIS — E876 Hypokalemia: Secondary | ICD-10-CM | POA: Diagnosis not present

## 2024-01-11 DIAGNOSIS — E7849 Other hyperlipidemia: Secondary | ICD-10-CM | POA: Diagnosis not present

## 2024-01-11 DIAGNOSIS — I679 Cerebrovascular disease, unspecified: Secondary | ICD-10-CM | POA: Diagnosis not present

## 2024-01-11 DIAGNOSIS — G4089 Other seizures: Secondary | ICD-10-CM | POA: Diagnosis not present

## 2024-01-11 DIAGNOSIS — I10 Essential (primary) hypertension: Secondary | ICD-10-CM | POA: Diagnosis not present

## 2024-01-11 DIAGNOSIS — C729 Malignant neoplasm of central nervous system, unspecified: Secondary | ICD-10-CM | POA: Diagnosis not present

## 2024-01-11 DIAGNOSIS — D378 Neoplasm of uncertain behavior of other specified digestive organs: Secondary | ICD-10-CM | POA: Diagnosis not present

## 2024-01-12 DIAGNOSIS — I10 Essential (primary) hypertension: Secondary | ICD-10-CM | POA: Diagnosis not present

## 2024-01-12 DIAGNOSIS — D378 Neoplasm of uncertain behavior of other specified digestive organs: Secondary | ICD-10-CM | POA: Diagnosis not present

## 2024-01-12 DIAGNOSIS — G4089 Other seizures: Secondary | ICD-10-CM | POA: Diagnosis not present

## 2024-01-12 DIAGNOSIS — E7849 Other hyperlipidemia: Secondary | ICD-10-CM | POA: Diagnosis not present

## 2024-01-12 DIAGNOSIS — E039 Hypothyroidism, unspecified: Secondary | ICD-10-CM | POA: Diagnosis not present

## 2024-01-12 DIAGNOSIS — I679 Cerebrovascular disease, unspecified: Secondary | ICD-10-CM | POA: Diagnosis not present

## 2024-01-12 DIAGNOSIS — E876 Hypokalemia: Secondary | ICD-10-CM | POA: Diagnosis not present

## 2024-01-13 DIAGNOSIS — G4089 Other seizures: Secondary | ICD-10-CM | POA: Diagnosis not present

## 2024-01-13 DIAGNOSIS — E039 Hypothyroidism, unspecified: Secondary | ICD-10-CM | POA: Diagnosis not present

## 2024-01-13 DIAGNOSIS — I679 Cerebrovascular disease, unspecified: Secondary | ICD-10-CM | POA: Diagnosis not present

## 2024-01-13 DIAGNOSIS — E7849 Other hyperlipidemia: Secondary | ICD-10-CM | POA: Diagnosis not present

## 2024-01-13 DIAGNOSIS — D378 Neoplasm of uncertain behavior of other specified digestive organs: Secondary | ICD-10-CM | POA: Diagnosis not present

## 2024-01-13 DIAGNOSIS — E876 Hypokalemia: Secondary | ICD-10-CM | POA: Diagnosis not present

## 2024-01-13 DIAGNOSIS — I10 Essential (primary) hypertension: Secondary | ICD-10-CM | POA: Diagnosis not present

## 2024-01-14 DIAGNOSIS — E7849 Other hyperlipidemia: Secondary | ICD-10-CM | POA: Diagnosis not present

## 2024-01-14 DIAGNOSIS — E876 Hypokalemia: Secondary | ICD-10-CM | POA: Diagnosis not present

## 2024-01-14 DIAGNOSIS — G4089 Other seizures: Secondary | ICD-10-CM | POA: Diagnosis not present

## 2024-01-14 DIAGNOSIS — I679 Cerebrovascular disease, unspecified: Secondary | ICD-10-CM | POA: Diagnosis not present

## 2024-01-14 DIAGNOSIS — E039 Hypothyroidism, unspecified: Secondary | ICD-10-CM | POA: Diagnosis not present

## 2024-01-14 DIAGNOSIS — D378 Neoplasm of uncertain behavior of other specified digestive organs: Secondary | ICD-10-CM | POA: Diagnosis not present

## 2024-01-15 DIAGNOSIS — I679 Cerebrovascular disease, unspecified: Secondary | ICD-10-CM | POA: Diagnosis not present

## 2024-01-15 DIAGNOSIS — C729 Malignant neoplasm of central nervous system, unspecified: Secondary | ICD-10-CM | POA: Diagnosis not present

## 2024-01-15 DIAGNOSIS — I10 Essential (primary) hypertension: Secondary | ICD-10-CM | POA: Diagnosis not present

## 2024-01-15 DIAGNOSIS — D378 Neoplasm of uncertain behavior of other specified digestive organs: Secondary | ICD-10-CM | POA: Diagnosis not present

## 2024-01-15 DIAGNOSIS — E039 Hypothyroidism, unspecified: Secondary | ICD-10-CM | POA: Diagnosis not present

## 2024-01-15 DIAGNOSIS — E7849 Other hyperlipidemia: Secondary | ICD-10-CM | POA: Diagnosis not present

## 2024-01-15 DIAGNOSIS — G4089 Other seizures: Secondary | ICD-10-CM | POA: Diagnosis not present

## 2024-01-15 DIAGNOSIS — E876 Hypokalemia: Secondary | ICD-10-CM | POA: Diagnosis not present

## 2024-01-16 DIAGNOSIS — I679 Cerebrovascular disease, unspecified: Secondary | ICD-10-CM | POA: Diagnosis not present

## 2024-01-16 DIAGNOSIS — E876 Hypokalemia: Secondary | ICD-10-CM | POA: Diagnosis not present

## 2024-01-16 DIAGNOSIS — D378 Neoplasm of uncertain behavior of other specified digestive organs: Secondary | ICD-10-CM | POA: Diagnosis not present

## 2024-01-16 DIAGNOSIS — G4089 Other seizures: Secondary | ICD-10-CM | POA: Diagnosis not present

## 2024-01-16 DIAGNOSIS — E7849 Other hyperlipidemia: Secondary | ICD-10-CM | POA: Diagnosis not present

## 2024-01-16 DIAGNOSIS — E039 Hypothyroidism, unspecified: Secondary | ICD-10-CM | POA: Diagnosis not present

## 2024-01-16 DIAGNOSIS — I10 Essential (primary) hypertension: Secondary | ICD-10-CM | POA: Diagnosis not present

## 2024-01-17 DIAGNOSIS — D378 Neoplasm of uncertain behavior of other specified digestive organs: Secondary | ICD-10-CM | POA: Diagnosis not present

## 2024-01-17 DIAGNOSIS — E876 Hypokalemia: Secondary | ICD-10-CM | POA: Diagnosis not present

## 2024-01-17 DIAGNOSIS — E039 Hypothyroidism, unspecified: Secondary | ICD-10-CM | POA: Diagnosis not present

## 2024-01-17 DIAGNOSIS — I679 Cerebrovascular disease, unspecified: Secondary | ICD-10-CM | POA: Diagnosis not present

## 2024-01-17 DIAGNOSIS — I10 Essential (primary) hypertension: Secondary | ICD-10-CM | POA: Diagnosis not present

## 2024-01-17 DIAGNOSIS — E7849 Other hyperlipidemia: Secondary | ICD-10-CM | POA: Diagnosis not present

## 2024-01-17 DIAGNOSIS — G4089 Other seizures: Secondary | ICD-10-CM | POA: Diagnosis not present

## 2024-01-18 DIAGNOSIS — G4089 Other seizures: Secondary | ICD-10-CM | POA: Diagnosis not present

## 2024-01-18 DIAGNOSIS — I10 Essential (primary) hypertension: Secondary | ICD-10-CM | POA: Diagnosis not present

## 2024-01-18 DIAGNOSIS — C729 Malignant neoplasm of central nervous system, unspecified: Secondary | ICD-10-CM | POA: Diagnosis not present

## 2024-01-18 DIAGNOSIS — E876 Hypokalemia: Secondary | ICD-10-CM | POA: Diagnosis not present

## 2024-01-18 DIAGNOSIS — E039 Hypothyroidism, unspecified: Secondary | ICD-10-CM | POA: Diagnosis not present

## 2024-01-18 DIAGNOSIS — D378 Neoplasm of uncertain behavior of other specified digestive organs: Secondary | ICD-10-CM | POA: Diagnosis not present

## 2024-01-18 DIAGNOSIS — E7849 Other hyperlipidemia: Secondary | ICD-10-CM | POA: Diagnosis not present

## 2024-01-18 DIAGNOSIS — I679 Cerebrovascular disease, unspecified: Secondary | ICD-10-CM | POA: Diagnosis not present

## 2024-01-19 DIAGNOSIS — E039 Hypothyroidism, unspecified: Secondary | ICD-10-CM | POA: Diagnosis not present

## 2024-01-19 DIAGNOSIS — E7849 Other hyperlipidemia: Secondary | ICD-10-CM | POA: Diagnosis not present

## 2024-01-19 DIAGNOSIS — I679 Cerebrovascular disease, unspecified: Secondary | ICD-10-CM | POA: Diagnosis not present

## 2024-01-19 DIAGNOSIS — I10 Essential (primary) hypertension: Secondary | ICD-10-CM | POA: Diagnosis not present

## 2024-01-19 DIAGNOSIS — E876 Hypokalemia: Secondary | ICD-10-CM | POA: Diagnosis not present

## 2024-01-19 DIAGNOSIS — D378 Neoplasm of uncertain behavior of other specified digestive organs: Secondary | ICD-10-CM | POA: Diagnosis not present

## 2024-01-19 DIAGNOSIS — G4089 Other seizures: Secondary | ICD-10-CM | POA: Diagnosis not present

## 2024-01-20 DIAGNOSIS — E876 Hypokalemia: Secondary | ICD-10-CM | POA: Diagnosis not present

## 2024-01-20 DIAGNOSIS — I679 Cerebrovascular disease, unspecified: Secondary | ICD-10-CM | POA: Diagnosis not present

## 2024-01-20 DIAGNOSIS — C729 Malignant neoplasm of central nervous system, unspecified: Secondary | ICD-10-CM | POA: Diagnosis not present

## 2024-01-20 DIAGNOSIS — D378 Neoplasm of uncertain behavior of other specified digestive organs: Secondary | ICD-10-CM | POA: Diagnosis not present

## 2024-01-20 DIAGNOSIS — E7849 Other hyperlipidemia: Secondary | ICD-10-CM | POA: Diagnosis not present

## 2024-01-20 DIAGNOSIS — E039 Hypothyroidism, unspecified: Secondary | ICD-10-CM | POA: Diagnosis not present

## 2024-01-20 DIAGNOSIS — G4089 Other seizures: Secondary | ICD-10-CM | POA: Diagnosis not present

## 2024-01-20 DIAGNOSIS — I10 Essential (primary) hypertension: Secondary | ICD-10-CM | POA: Diagnosis not present

## 2024-01-21 DIAGNOSIS — E039 Hypothyroidism, unspecified: Secondary | ICD-10-CM | POA: Diagnosis not present

## 2024-01-21 DIAGNOSIS — E876 Hypokalemia: Secondary | ICD-10-CM | POA: Diagnosis not present

## 2024-01-21 DIAGNOSIS — E7849 Other hyperlipidemia: Secondary | ICD-10-CM | POA: Diagnosis not present

## 2024-01-21 DIAGNOSIS — I679 Cerebrovascular disease, unspecified: Secondary | ICD-10-CM | POA: Diagnosis not present

## 2024-01-21 DIAGNOSIS — I10 Essential (primary) hypertension: Secondary | ICD-10-CM | POA: Diagnosis not present

## 2024-01-21 DIAGNOSIS — D378 Neoplasm of uncertain behavior of other specified digestive organs: Secondary | ICD-10-CM | POA: Diagnosis not present

## 2024-01-21 DIAGNOSIS — G4089 Other seizures: Secondary | ICD-10-CM | POA: Diagnosis not present

## 2024-01-22 DIAGNOSIS — I10 Essential (primary) hypertension: Secondary | ICD-10-CM | POA: Diagnosis not present

## 2024-01-22 DIAGNOSIS — I679 Cerebrovascular disease, unspecified: Secondary | ICD-10-CM | POA: Diagnosis not present

## 2024-01-22 DIAGNOSIS — E876 Hypokalemia: Secondary | ICD-10-CM | POA: Diagnosis not present

## 2024-01-22 DIAGNOSIS — E7849 Other hyperlipidemia: Secondary | ICD-10-CM | POA: Diagnosis not present

## 2024-01-22 DIAGNOSIS — E039 Hypothyroidism, unspecified: Secondary | ICD-10-CM | POA: Diagnosis not present

## 2024-01-22 DIAGNOSIS — G4089 Other seizures: Secondary | ICD-10-CM | POA: Diagnosis not present

## 2024-01-22 DIAGNOSIS — D378 Neoplasm of uncertain behavior of other specified digestive organs: Secondary | ICD-10-CM | POA: Diagnosis not present

## 2024-01-23 DIAGNOSIS — E039 Hypothyroidism, unspecified: Secondary | ICD-10-CM | POA: Diagnosis not present

## 2024-01-23 DIAGNOSIS — E876 Hypokalemia: Secondary | ICD-10-CM | POA: Diagnosis not present

## 2024-01-23 DIAGNOSIS — I679 Cerebrovascular disease, unspecified: Secondary | ICD-10-CM | POA: Diagnosis not present

## 2024-01-23 DIAGNOSIS — C729 Malignant neoplasm of central nervous system, unspecified: Secondary | ICD-10-CM | POA: Diagnosis not present

## 2024-01-23 DIAGNOSIS — E7849 Other hyperlipidemia: Secondary | ICD-10-CM | POA: Diagnosis not present

## 2024-01-23 DIAGNOSIS — D378 Neoplasm of uncertain behavior of other specified digestive organs: Secondary | ICD-10-CM | POA: Diagnosis not present

## 2024-01-23 DIAGNOSIS — I10 Essential (primary) hypertension: Secondary | ICD-10-CM | POA: Diagnosis not present

## 2024-01-23 DIAGNOSIS — G4089 Other seizures: Secondary | ICD-10-CM | POA: Diagnosis not present

## 2024-01-24 DIAGNOSIS — G4089 Other seizures: Secondary | ICD-10-CM | POA: Diagnosis not present

## 2024-01-24 DIAGNOSIS — E039 Hypothyroidism, unspecified: Secondary | ICD-10-CM | POA: Diagnosis not present

## 2024-01-24 DIAGNOSIS — I679 Cerebrovascular disease, unspecified: Secondary | ICD-10-CM | POA: Diagnosis not present

## 2024-01-24 DIAGNOSIS — E876 Hypokalemia: Secondary | ICD-10-CM | POA: Diagnosis not present

## 2024-01-24 DIAGNOSIS — I10 Essential (primary) hypertension: Secondary | ICD-10-CM | POA: Diagnosis not present

## 2024-01-24 DIAGNOSIS — D378 Neoplasm of uncertain behavior of other specified digestive organs: Secondary | ICD-10-CM | POA: Diagnosis not present

## 2024-01-24 DIAGNOSIS — E7849 Other hyperlipidemia: Secondary | ICD-10-CM | POA: Diagnosis not present

## 2024-01-25 DIAGNOSIS — D378 Neoplasm of uncertain behavior of other specified digestive organs: Secondary | ICD-10-CM | POA: Diagnosis not present

## 2024-01-25 DIAGNOSIS — G4089 Other seizures: Secondary | ICD-10-CM | POA: Diagnosis not present

## 2024-01-25 DIAGNOSIS — I10 Essential (primary) hypertension: Secondary | ICD-10-CM | POA: Diagnosis not present

## 2024-01-25 DIAGNOSIS — C729 Malignant neoplasm of central nervous system, unspecified: Secondary | ICD-10-CM | POA: Diagnosis not present

## 2024-01-25 DIAGNOSIS — E7849 Other hyperlipidemia: Secondary | ICD-10-CM | POA: Diagnosis not present

## 2024-01-25 DIAGNOSIS — E039 Hypothyroidism, unspecified: Secondary | ICD-10-CM | POA: Diagnosis not present

## 2024-01-25 DIAGNOSIS — E876 Hypokalemia: Secondary | ICD-10-CM | POA: Diagnosis not present

## 2024-01-25 DIAGNOSIS — I679 Cerebrovascular disease, unspecified: Secondary | ICD-10-CM | POA: Diagnosis not present

## 2024-01-26 DIAGNOSIS — I10 Essential (primary) hypertension: Secondary | ICD-10-CM | POA: Diagnosis not present

## 2024-01-26 DIAGNOSIS — E039 Hypothyroidism, unspecified: Secondary | ICD-10-CM | POA: Diagnosis not present

## 2024-01-26 DIAGNOSIS — E7849 Other hyperlipidemia: Secondary | ICD-10-CM | POA: Diagnosis not present

## 2024-01-26 DIAGNOSIS — I679 Cerebrovascular disease, unspecified: Secondary | ICD-10-CM | POA: Diagnosis not present

## 2024-01-26 DIAGNOSIS — E876 Hypokalemia: Secondary | ICD-10-CM | POA: Diagnosis not present

## 2024-01-26 DIAGNOSIS — G4089 Other seizures: Secondary | ICD-10-CM | POA: Diagnosis not present

## 2024-01-26 DIAGNOSIS — D378 Neoplasm of uncertain behavior of other specified digestive organs: Secondary | ICD-10-CM | POA: Diagnosis not present

## 2024-01-26 DIAGNOSIS — C729 Malignant neoplasm of central nervous system, unspecified: Secondary | ICD-10-CM | POA: Diagnosis not present

## 2024-01-27 DIAGNOSIS — E876 Hypokalemia: Secondary | ICD-10-CM | POA: Diagnosis not present

## 2024-01-27 DIAGNOSIS — I10 Essential (primary) hypertension: Secondary | ICD-10-CM | POA: Diagnosis not present

## 2024-01-27 DIAGNOSIS — E039 Hypothyroidism, unspecified: Secondary | ICD-10-CM | POA: Diagnosis not present

## 2024-01-27 DIAGNOSIS — D378 Neoplasm of uncertain behavior of other specified digestive organs: Secondary | ICD-10-CM | POA: Diagnosis not present

## 2024-01-27 DIAGNOSIS — I679 Cerebrovascular disease, unspecified: Secondary | ICD-10-CM | POA: Diagnosis not present

## 2024-01-27 DIAGNOSIS — E7849 Other hyperlipidemia: Secondary | ICD-10-CM | POA: Diagnosis not present

## 2024-01-27 DIAGNOSIS — G4089 Other seizures: Secondary | ICD-10-CM | POA: Diagnosis not present

## 2024-01-27 DIAGNOSIS — C729 Malignant neoplasm of central nervous system, unspecified: Secondary | ICD-10-CM | POA: Diagnosis not present

## 2024-01-28 DIAGNOSIS — G4089 Other seizures: Secondary | ICD-10-CM | POA: Diagnosis not present

## 2024-01-28 DIAGNOSIS — E876 Hypokalemia: Secondary | ICD-10-CM | POA: Diagnosis not present

## 2024-01-28 DIAGNOSIS — C729 Malignant neoplasm of central nervous system, unspecified: Secondary | ICD-10-CM | POA: Diagnosis not present

## 2024-01-28 DIAGNOSIS — D378 Neoplasm of uncertain behavior of other specified digestive organs: Secondary | ICD-10-CM | POA: Diagnosis not present

## 2024-01-28 DIAGNOSIS — E039 Hypothyroidism, unspecified: Secondary | ICD-10-CM | POA: Diagnosis not present

## 2024-01-28 DIAGNOSIS — I679 Cerebrovascular disease, unspecified: Secondary | ICD-10-CM | POA: Diagnosis not present

## 2024-01-28 DIAGNOSIS — E7849 Other hyperlipidemia: Secondary | ICD-10-CM | POA: Diagnosis not present

## 2024-01-28 DIAGNOSIS — I10 Essential (primary) hypertension: Secondary | ICD-10-CM | POA: Diagnosis not present

## 2024-01-29 DIAGNOSIS — E876 Hypokalemia: Secondary | ICD-10-CM | POA: Diagnosis not present

## 2024-01-29 DIAGNOSIS — D378 Neoplasm of uncertain behavior of other specified digestive organs: Secondary | ICD-10-CM | POA: Diagnosis not present

## 2024-01-29 DIAGNOSIS — C729 Malignant neoplasm of central nervous system, unspecified: Secondary | ICD-10-CM | POA: Diagnosis not present

## 2024-01-29 DIAGNOSIS — I679 Cerebrovascular disease, unspecified: Secondary | ICD-10-CM | POA: Diagnosis not present

## 2024-01-29 DIAGNOSIS — G4089 Other seizures: Secondary | ICD-10-CM | POA: Diagnosis not present

## 2024-01-29 DIAGNOSIS — E039 Hypothyroidism, unspecified: Secondary | ICD-10-CM | POA: Diagnosis not present

## 2024-01-29 DIAGNOSIS — E7849 Other hyperlipidemia: Secondary | ICD-10-CM | POA: Diagnosis not present

## 2024-01-29 DIAGNOSIS — I10 Essential (primary) hypertension: Secondary | ICD-10-CM | POA: Diagnosis not present

## 2024-01-30 DIAGNOSIS — I10 Essential (primary) hypertension: Secondary | ICD-10-CM | POA: Diagnosis not present

## 2024-01-30 DIAGNOSIS — D378 Neoplasm of uncertain behavior of other specified digestive organs: Secondary | ICD-10-CM | POA: Diagnosis not present

## 2024-01-30 DIAGNOSIS — C729 Malignant neoplasm of central nervous system, unspecified: Secondary | ICD-10-CM | POA: Diagnosis not present

## 2024-01-30 DIAGNOSIS — G4089 Other seizures: Secondary | ICD-10-CM | POA: Diagnosis not present

## 2024-01-30 DIAGNOSIS — E039 Hypothyroidism, unspecified: Secondary | ICD-10-CM | POA: Diagnosis not present

## 2024-01-30 DIAGNOSIS — E876 Hypokalemia: Secondary | ICD-10-CM | POA: Diagnosis not present

## 2024-01-30 DIAGNOSIS — I679 Cerebrovascular disease, unspecified: Secondary | ICD-10-CM | POA: Diagnosis not present

## 2024-01-30 DIAGNOSIS — E7849 Other hyperlipidemia: Secondary | ICD-10-CM | POA: Diagnosis not present

## 2024-01-31 DIAGNOSIS — G4089 Other seizures: Secondary | ICD-10-CM | POA: Diagnosis not present

## 2024-01-31 DIAGNOSIS — I679 Cerebrovascular disease, unspecified: Secondary | ICD-10-CM | POA: Diagnosis not present

## 2024-01-31 DIAGNOSIS — D378 Neoplasm of uncertain behavior of other specified digestive organs: Secondary | ICD-10-CM | POA: Diagnosis not present

## 2024-01-31 DIAGNOSIS — E039 Hypothyroidism, unspecified: Secondary | ICD-10-CM | POA: Diagnosis not present

## 2024-01-31 DIAGNOSIS — E876 Hypokalemia: Secondary | ICD-10-CM | POA: Diagnosis not present

## 2024-01-31 DIAGNOSIS — E7849 Other hyperlipidemia: Secondary | ICD-10-CM | POA: Diagnosis not present

## 2024-01-31 DIAGNOSIS — I10 Essential (primary) hypertension: Secondary | ICD-10-CM | POA: Diagnosis not present

## 2024-02-01 DIAGNOSIS — E7849 Other hyperlipidemia: Secondary | ICD-10-CM | POA: Diagnosis not present

## 2024-02-01 DIAGNOSIS — I10 Essential (primary) hypertension: Secondary | ICD-10-CM | POA: Diagnosis not present

## 2024-02-01 DIAGNOSIS — C729 Malignant neoplasm of central nervous system, unspecified: Secondary | ICD-10-CM | POA: Diagnosis not present

## 2024-02-01 DIAGNOSIS — D378 Neoplasm of uncertain behavior of other specified digestive organs: Secondary | ICD-10-CM | POA: Diagnosis not present

## 2024-02-01 DIAGNOSIS — E876 Hypokalemia: Secondary | ICD-10-CM | POA: Diagnosis not present

## 2024-02-01 DIAGNOSIS — G4089 Other seizures: Secondary | ICD-10-CM | POA: Diagnosis not present

## 2024-02-01 DIAGNOSIS — E039 Hypothyroidism, unspecified: Secondary | ICD-10-CM | POA: Diagnosis not present

## 2024-02-01 DIAGNOSIS — I679 Cerebrovascular disease, unspecified: Secondary | ICD-10-CM | POA: Diagnosis not present

## 2024-02-02 DIAGNOSIS — I679 Cerebrovascular disease, unspecified: Secondary | ICD-10-CM | POA: Diagnosis not present

## 2024-02-02 DIAGNOSIS — E7849 Other hyperlipidemia: Secondary | ICD-10-CM | POA: Diagnosis not present

## 2024-02-02 DIAGNOSIS — D378 Neoplasm of uncertain behavior of other specified digestive organs: Secondary | ICD-10-CM | POA: Diagnosis not present

## 2024-02-02 DIAGNOSIS — E876 Hypokalemia: Secondary | ICD-10-CM | POA: Diagnosis not present

## 2024-02-02 DIAGNOSIS — C729 Malignant neoplasm of central nervous system, unspecified: Secondary | ICD-10-CM | POA: Diagnosis not present

## 2024-02-02 DIAGNOSIS — I10 Essential (primary) hypertension: Secondary | ICD-10-CM | POA: Diagnosis not present

## 2024-02-02 DIAGNOSIS — E039 Hypothyroidism, unspecified: Secondary | ICD-10-CM | POA: Diagnosis not present

## 2024-02-02 DIAGNOSIS — G4089 Other seizures: Secondary | ICD-10-CM | POA: Diagnosis not present

## 2024-02-03 DIAGNOSIS — E876 Hypokalemia: Secondary | ICD-10-CM | POA: Diagnosis not present

## 2024-02-03 DIAGNOSIS — D378 Neoplasm of uncertain behavior of other specified digestive organs: Secondary | ICD-10-CM | POA: Diagnosis not present

## 2024-02-03 DIAGNOSIS — G4089 Other seizures: Secondary | ICD-10-CM | POA: Diagnosis not present

## 2024-02-03 DIAGNOSIS — C729 Malignant neoplasm of central nervous system, unspecified: Secondary | ICD-10-CM | POA: Diagnosis not present

## 2024-02-03 DIAGNOSIS — E7849 Other hyperlipidemia: Secondary | ICD-10-CM | POA: Diagnosis not present

## 2024-02-03 DIAGNOSIS — I10 Essential (primary) hypertension: Secondary | ICD-10-CM | POA: Diagnosis not present

## 2024-02-03 DIAGNOSIS — I679 Cerebrovascular disease, unspecified: Secondary | ICD-10-CM | POA: Diagnosis not present

## 2024-02-04 DIAGNOSIS — G4089 Other seizures: Secondary | ICD-10-CM | POA: Diagnosis not present

## 2024-02-04 DIAGNOSIS — I679 Cerebrovascular disease, unspecified: Secondary | ICD-10-CM | POA: Diagnosis not present

## 2024-02-04 DIAGNOSIS — D378 Neoplasm of uncertain behavior of other specified digestive organs: Secondary | ICD-10-CM | POA: Diagnosis not present

## 2024-02-04 DIAGNOSIS — E876 Hypokalemia: Secondary | ICD-10-CM | POA: Diagnosis not present

## 2024-02-04 DIAGNOSIS — I10 Essential (primary) hypertension: Secondary | ICD-10-CM | POA: Diagnosis not present

## 2024-02-04 DIAGNOSIS — E7849 Other hyperlipidemia: Secondary | ICD-10-CM | POA: Diagnosis not present

## 2024-02-04 DIAGNOSIS — E039 Hypothyroidism, unspecified: Secondary | ICD-10-CM | POA: Diagnosis not present

## 2024-02-05 DIAGNOSIS — I679 Cerebrovascular disease, unspecified: Secondary | ICD-10-CM | POA: Diagnosis not present

## 2024-02-05 DIAGNOSIS — D378 Neoplasm of uncertain behavior of other specified digestive organs: Secondary | ICD-10-CM | POA: Diagnosis not present

## 2024-02-05 DIAGNOSIS — E7849 Other hyperlipidemia: Secondary | ICD-10-CM | POA: Diagnosis not present

## 2024-02-05 DIAGNOSIS — G4089 Other seizures: Secondary | ICD-10-CM | POA: Diagnosis not present

## 2024-02-05 DIAGNOSIS — E876 Hypokalemia: Secondary | ICD-10-CM | POA: Diagnosis not present

## 2024-02-05 DIAGNOSIS — I10 Essential (primary) hypertension: Secondary | ICD-10-CM | POA: Diagnosis not present

## 2024-02-05 DIAGNOSIS — C729 Malignant neoplasm of central nervous system, unspecified: Secondary | ICD-10-CM | POA: Diagnosis not present

## 2024-02-06 DIAGNOSIS — I10 Essential (primary) hypertension: Secondary | ICD-10-CM | POA: Diagnosis not present

## 2024-02-06 DIAGNOSIS — E7849 Other hyperlipidemia: Secondary | ICD-10-CM | POA: Diagnosis not present

## 2024-02-06 DIAGNOSIS — E039 Hypothyroidism, unspecified: Secondary | ICD-10-CM | POA: Diagnosis not present

## 2024-02-06 DIAGNOSIS — C729 Malignant neoplasm of central nervous system, unspecified: Secondary | ICD-10-CM | POA: Diagnosis not present

## 2024-02-06 DIAGNOSIS — G4089 Other seizures: Secondary | ICD-10-CM | POA: Diagnosis not present

## 2024-02-06 DIAGNOSIS — E876 Hypokalemia: Secondary | ICD-10-CM | POA: Diagnosis not present

## 2024-02-06 DIAGNOSIS — I679 Cerebrovascular disease, unspecified: Secondary | ICD-10-CM | POA: Diagnosis not present

## 2024-02-06 DIAGNOSIS — D378 Neoplasm of uncertain behavior of other specified digestive organs: Secondary | ICD-10-CM | POA: Diagnosis not present

## 2024-02-07 DIAGNOSIS — G4089 Other seizures: Secondary | ICD-10-CM | POA: Diagnosis not present

## 2024-02-07 DIAGNOSIS — E876 Hypokalemia: Secondary | ICD-10-CM | POA: Diagnosis not present

## 2024-02-07 DIAGNOSIS — I10 Essential (primary) hypertension: Secondary | ICD-10-CM | POA: Diagnosis not present

## 2024-02-07 DIAGNOSIS — D378 Neoplasm of uncertain behavior of other specified digestive organs: Secondary | ICD-10-CM | POA: Diagnosis not present

## 2024-02-07 DIAGNOSIS — E039 Hypothyroidism, unspecified: Secondary | ICD-10-CM | POA: Diagnosis not present

## 2024-02-07 DIAGNOSIS — E7849 Other hyperlipidemia: Secondary | ICD-10-CM | POA: Diagnosis not present

## 2024-02-07 DIAGNOSIS — I679 Cerebrovascular disease, unspecified: Secondary | ICD-10-CM | POA: Diagnosis not present

## 2024-02-08 DIAGNOSIS — E039 Hypothyroidism, unspecified: Secondary | ICD-10-CM | POA: Diagnosis not present

## 2024-02-08 DIAGNOSIS — D378 Neoplasm of uncertain behavior of other specified digestive organs: Secondary | ICD-10-CM | POA: Diagnosis not present

## 2024-02-08 DIAGNOSIS — E7849 Other hyperlipidemia: Secondary | ICD-10-CM | POA: Diagnosis not present

## 2024-02-08 DIAGNOSIS — G4089 Other seizures: Secondary | ICD-10-CM | POA: Diagnosis not present

## 2024-02-08 DIAGNOSIS — E876 Hypokalemia: Secondary | ICD-10-CM | POA: Diagnosis not present

## 2024-02-08 DIAGNOSIS — I10 Essential (primary) hypertension: Secondary | ICD-10-CM | POA: Diagnosis not present

## 2024-02-08 DIAGNOSIS — I679 Cerebrovascular disease, unspecified: Secondary | ICD-10-CM | POA: Diagnosis not present

## 2024-02-09 DIAGNOSIS — D378 Neoplasm of uncertain behavior of other specified digestive organs: Secondary | ICD-10-CM | POA: Diagnosis not present

## 2024-02-09 DIAGNOSIS — E039 Hypothyroidism, unspecified: Secondary | ICD-10-CM | POA: Diagnosis not present

## 2024-02-09 DIAGNOSIS — I679 Cerebrovascular disease, unspecified: Secondary | ICD-10-CM | POA: Diagnosis not present

## 2024-02-09 DIAGNOSIS — G4089 Other seizures: Secondary | ICD-10-CM | POA: Diagnosis not present

## 2024-02-09 DIAGNOSIS — E7849 Other hyperlipidemia: Secondary | ICD-10-CM | POA: Diagnosis not present

## 2024-02-09 DIAGNOSIS — I10 Essential (primary) hypertension: Secondary | ICD-10-CM | POA: Diagnosis not present

## 2024-02-09 DIAGNOSIS — E876 Hypokalemia: Secondary | ICD-10-CM | POA: Diagnosis not present

## 2024-02-10 DIAGNOSIS — C729 Malignant neoplasm of central nervous system, unspecified: Secondary | ICD-10-CM | POA: Diagnosis not present

## 2024-02-10 DIAGNOSIS — I679 Cerebrovascular disease, unspecified: Secondary | ICD-10-CM | POA: Diagnosis not present

## 2024-02-10 DIAGNOSIS — I10 Essential (primary) hypertension: Secondary | ICD-10-CM | POA: Diagnosis not present

## 2024-02-10 DIAGNOSIS — E039 Hypothyroidism, unspecified: Secondary | ICD-10-CM | POA: Diagnosis not present

## 2024-02-10 DIAGNOSIS — E7849 Other hyperlipidemia: Secondary | ICD-10-CM | POA: Diagnosis not present

## 2024-02-10 DIAGNOSIS — D378 Neoplasm of uncertain behavior of other specified digestive organs: Secondary | ICD-10-CM | POA: Diagnosis not present

## 2024-02-10 DIAGNOSIS — E876 Hypokalemia: Secondary | ICD-10-CM | POA: Diagnosis not present

## 2024-02-10 DIAGNOSIS — G4089 Other seizures: Secondary | ICD-10-CM | POA: Diagnosis not present

## 2024-02-11 DIAGNOSIS — E7849 Other hyperlipidemia: Secondary | ICD-10-CM | POA: Diagnosis not present

## 2024-02-11 DIAGNOSIS — E876 Hypokalemia: Secondary | ICD-10-CM | POA: Diagnosis not present

## 2024-02-11 DIAGNOSIS — I679 Cerebrovascular disease, unspecified: Secondary | ICD-10-CM | POA: Diagnosis not present

## 2024-02-11 DIAGNOSIS — C729 Malignant neoplasm of central nervous system, unspecified: Secondary | ICD-10-CM | POA: Diagnosis not present

## 2024-02-11 DIAGNOSIS — D378 Neoplasm of uncertain behavior of other specified digestive organs: Secondary | ICD-10-CM | POA: Diagnosis not present

## 2024-02-11 DIAGNOSIS — I10 Essential (primary) hypertension: Secondary | ICD-10-CM | POA: Diagnosis not present

## 2024-02-11 DIAGNOSIS — G4089 Other seizures: Secondary | ICD-10-CM | POA: Diagnosis not present

## 2024-02-11 DIAGNOSIS — E039 Hypothyroidism, unspecified: Secondary | ICD-10-CM | POA: Diagnosis not present

## 2024-02-12 DIAGNOSIS — G4089 Other seizures: Secondary | ICD-10-CM | POA: Diagnosis not present

## 2024-02-12 DIAGNOSIS — C729 Malignant neoplasm of central nervous system, unspecified: Secondary | ICD-10-CM | POA: Diagnosis not present

## 2024-02-12 DIAGNOSIS — E876 Hypokalemia: Secondary | ICD-10-CM | POA: Diagnosis not present

## 2024-02-12 DIAGNOSIS — I679 Cerebrovascular disease, unspecified: Secondary | ICD-10-CM | POA: Diagnosis not present

## 2024-02-12 DIAGNOSIS — E7849 Other hyperlipidemia: Secondary | ICD-10-CM | POA: Diagnosis not present

## 2024-02-12 DIAGNOSIS — D378 Neoplasm of uncertain behavior of other specified digestive organs: Secondary | ICD-10-CM | POA: Diagnosis not present

## 2024-02-12 DIAGNOSIS — E039 Hypothyroidism, unspecified: Secondary | ICD-10-CM | POA: Diagnosis not present

## 2024-02-12 DIAGNOSIS — I10 Essential (primary) hypertension: Secondary | ICD-10-CM | POA: Diagnosis not present

## 2024-02-13 DIAGNOSIS — E876 Hypokalemia: Secondary | ICD-10-CM | POA: Diagnosis not present

## 2024-02-13 DIAGNOSIS — I679 Cerebrovascular disease, unspecified: Secondary | ICD-10-CM | POA: Diagnosis not present

## 2024-02-13 DIAGNOSIS — G4089 Other seizures: Secondary | ICD-10-CM | POA: Diagnosis not present

## 2024-02-13 DIAGNOSIS — E039 Hypothyroidism, unspecified: Secondary | ICD-10-CM | POA: Diagnosis not present

## 2024-02-13 DIAGNOSIS — E7849 Other hyperlipidemia: Secondary | ICD-10-CM | POA: Diagnosis not present

## 2024-02-13 DIAGNOSIS — D378 Neoplasm of uncertain behavior of other specified digestive organs: Secondary | ICD-10-CM | POA: Diagnosis not present

## 2024-02-13 DIAGNOSIS — I10 Essential (primary) hypertension: Secondary | ICD-10-CM | POA: Diagnosis not present

## 2024-02-14 DIAGNOSIS — E039 Hypothyroidism, unspecified: Secondary | ICD-10-CM | POA: Diagnosis not present

## 2024-02-14 DIAGNOSIS — D378 Neoplasm of uncertain behavior of other specified digestive organs: Secondary | ICD-10-CM | POA: Diagnosis not present

## 2024-02-14 DIAGNOSIS — I10 Essential (primary) hypertension: Secondary | ICD-10-CM | POA: Diagnosis not present

## 2024-02-14 DIAGNOSIS — I679 Cerebrovascular disease, unspecified: Secondary | ICD-10-CM | POA: Diagnosis not present

## 2024-02-14 DIAGNOSIS — E876 Hypokalemia: Secondary | ICD-10-CM | POA: Diagnosis not present

## 2024-02-14 DIAGNOSIS — C729 Malignant neoplasm of central nervous system, unspecified: Secondary | ICD-10-CM | POA: Diagnosis not present

## 2024-02-14 DIAGNOSIS — G4089 Other seizures: Secondary | ICD-10-CM | POA: Diagnosis not present

## 2024-02-14 DIAGNOSIS — E7849 Other hyperlipidemia: Secondary | ICD-10-CM | POA: Diagnosis not present

## 2024-02-15 DIAGNOSIS — D378 Neoplasm of uncertain behavior of other specified digestive organs: Secondary | ICD-10-CM | POA: Diagnosis not present

## 2024-02-15 DIAGNOSIS — E039 Hypothyroidism, unspecified: Secondary | ICD-10-CM | POA: Diagnosis not present

## 2024-02-15 DIAGNOSIS — E876 Hypokalemia: Secondary | ICD-10-CM | POA: Diagnosis not present

## 2024-02-15 DIAGNOSIS — I10 Essential (primary) hypertension: Secondary | ICD-10-CM | POA: Diagnosis not present

## 2024-02-15 DIAGNOSIS — G4089 Other seizures: Secondary | ICD-10-CM | POA: Diagnosis not present

## 2024-02-15 DIAGNOSIS — I679 Cerebrovascular disease, unspecified: Secondary | ICD-10-CM | POA: Diagnosis not present

## 2024-02-15 DIAGNOSIS — E7849 Other hyperlipidemia: Secondary | ICD-10-CM | POA: Diagnosis not present

## 2024-02-16 DIAGNOSIS — E876 Hypokalemia: Secondary | ICD-10-CM | POA: Diagnosis not present

## 2024-02-16 DIAGNOSIS — E7849 Other hyperlipidemia: Secondary | ICD-10-CM | POA: Diagnosis not present

## 2024-02-16 DIAGNOSIS — I679 Cerebrovascular disease, unspecified: Secondary | ICD-10-CM | POA: Diagnosis not present

## 2024-02-16 DIAGNOSIS — I10 Essential (primary) hypertension: Secondary | ICD-10-CM | POA: Diagnosis not present

## 2024-02-16 DIAGNOSIS — E039 Hypothyroidism, unspecified: Secondary | ICD-10-CM | POA: Diagnosis not present

## 2024-02-16 DIAGNOSIS — G4089 Other seizures: Secondary | ICD-10-CM | POA: Diagnosis not present

## 2024-02-16 DIAGNOSIS — D378 Neoplasm of uncertain behavior of other specified digestive organs: Secondary | ICD-10-CM | POA: Diagnosis not present

## 2024-02-17 DIAGNOSIS — C729 Malignant neoplasm of central nervous system, unspecified: Secondary | ICD-10-CM | POA: Diagnosis not present

## 2024-02-17 DIAGNOSIS — I679 Cerebrovascular disease, unspecified: Secondary | ICD-10-CM | POA: Diagnosis not present

## 2024-02-17 DIAGNOSIS — D378 Neoplasm of uncertain behavior of other specified digestive organs: Secondary | ICD-10-CM | POA: Diagnosis not present

## 2024-02-17 DIAGNOSIS — E7849 Other hyperlipidemia: Secondary | ICD-10-CM | POA: Diagnosis not present

## 2024-02-17 DIAGNOSIS — E039 Hypothyroidism, unspecified: Secondary | ICD-10-CM | POA: Diagnosis not present

## 2024-02-17 DIAGNOSIS — G4089 Other seizures: Secondary | ICD-10-CM | POA: Diagnosis not present

## 2024-02-17 DIAGNOSIS — E876 Hypokalemia: Secondary | ICD-10-CM | POA: Diagnosis not present

## 2024-02-18 DIAGNOSIS — I679 Cerebrovascular disease, unspecified: Secondary | ICD-10-CM | POA: Diagnosis not present

## 2024-02-18 DIAGNOSIS — D378 Neoplasm of uncertain behavior of other specified digestive organs: Secondary | ICD-10-CM | POA: Diagnosis not present

## 2024-02-18 DIAGNOSIS — G4089 Other seizures: Secondary | ICD-10-CM | POA: Diagnosis not present

## 2024-02-18 DIAGNOSIS — E876 Hypokalemia: Secondary | ICD-10-CM | POA: Diagnosis not present

## 2024-02-18 DIAGNOSIS — E039 Hypothyroidism, unspecified: Secondary | ICD-10-CM | POA: Diagnosis not present

## 2024-02-18 DIAGNOSIS — I10 Essential (primary) hypertension: Secondary | ICD-10-CM | POA: Diagnosis not present

## 2024-02-18 DIAGNOSIS — C729 Malignant neoplasm of central nervous system, unspecified: Secondary | ICD-10-CM | POA: Diagnosis not present

## 2024-02-18 DIAGNOSIS — E7849 Other hyperlipidemia: Secondary | ICD-10-CM | POA: Diagnosis not present

## 2024-02-19 DIAGNOSIS — E7849 Other hyperlipidemia: Secondary | ICD-10-CM | POA: Diagnosis not present

## 2024-02-19 DIAGNOSIS — C729 Malignant neoplasm of central nervous system, unspecified: Secondary | ICD-10-CM | POA: Diagnosis not present

## 2024-02-19 DIAGNOSIS — I679 Cerebrovascular disease, unspecified: Secondary | ICD-10-CM | POA: Diagnosis not present

## 2024-02-19 DIAGNOSIS — G4089 Other seizures: Secondary | ICD-10-CM | POA: Diagnosis not present

## 2024-02-19 DIAGNOSIS — I10 Essential (primary) hypertension: Secondary | ICD-10-CM | POA: Diagnosis not present

## 2024-02-19 DIAGNOSIS — E876 Hypokalemia: Secondary | ICD-10-CM | POA: Diagnosis not present

## 2024-02-19 DIAGNOSIS — D378 Neoplasm of uncertain behavior of other specified digestive organs: Secondary | ICD-10-CM | POA: Diagnosis not present

## 2024-02-19 DIAGNOSIS — E039 Hypothyroidism, unspecified: Secondary | ICD-10-CM | POA: Diagnosis not present

## 2024-02-20 DIAGNOSIS — E876 Hypokalemia: Secondary | ICD-10-CM | POA: Diagnosis not present

## 2024-02-20 DIAGNOSIS — I10 Essential (primary) hypertension: Secondary | ICD-10-CM | POA: Diagnosis not present

## 2024-02-20 DIAGNOSIS — G4089 Other seizures: Secondary | ICD-10-CM | POA: Diagnosis not present

## 2024-02-20 DIAGNOSIS — I679 Cerebrovascular disease, unspecified: Secondary | ICD-10-CM | POA: Diagnosis not present

## 2024-02-20 DIAGNOSIS — E7849 Other hyperlipidemia: Secondary | ICD-10-CM | POA: Diagnosis not present

## 2024-02-20 DIAGNOSIS — D378 Neoplasm of uncertain behavior of other specified digestive organs: Secondary | ICD-10-CM | POA: Diagnosis not present

## 2024-02-20 DIAGNOSIS — E039 Hypothyroidism, unspecified: Secondary | ICD-10-CM | POA: Diagnosis not present

## 2024-02-21 DIAGNOSIS — E7849 Other hyperlipidemia: Secondary | ICD-10-CM | POA: Diagnosis not present

## 2024-02-21 DIAGNOSIS — I10 Essential (primary) hypertension: Secondary | ICD-10-CM | POA: Diagnosis not present

## 2024-02-21 DIAGNOSIS — E876 Hypokalemia: Secondary | ICD-10-CM | POA: Diagnosis not present

## 2024-02-21 DIAGNOSIS — D378 Neoplasm of uncertain behavior of other specified digestive organs: Secondary | ICD-10-CM | POA: Diagnosis not present

## 2024-02-21 DIAGNOSIS — G4089 Other seizures: Secondary | ICD-10-CM | POA: Diagnosis not present

## 2024-02-21 DIAGNOSIS — I679 Cerebrovascular disease, unspecified: Secondary | ICD-10-CM | POA: Diagnosis not present

## 2024-02-21 DIAGNOSIS — E039 Hypothyroidism, unspecified: Secondary | ICD-10-CM | POA: Diagnosis not present

## 2024-02-22 DIAGNOSIS — E7849 Other hyperlipidemia: Secondary | ICD-10-CM | POA: Diagnosis not present

## 2024-02-22 DIAGNOSIS — I10 Essential (primary) hypertension: Secondary | ICD-10-CM | POA: Diagnosis not present

## 2024-02-22 DIAGNOSIS — E876 Hypokalemia: Secondary | ICD-10-CM | POA: Diagnosis not present

## 2024-02-22 DIAGNOSIS — E039 Hypothyroidism, unspecified: Secondary | ICD-10-CM | POA: Diagnosis not present

## 2024-02-22 DIAGNOSIS — I679 Cerebrovascular disease, unspecified: Secondary | ICD-10-CM | POA: Diagnosis not present

## 2024-02-22 DIAGNOSIS — G4089 Other seizures: Secondary | ICD-10-CM | POA: Diagnosis not present

## 2024-02-22 DIAGNOSIS — D378 Neoplasm of uncertain behavior of other specified digestive organs: Secondary | ICD-10-CM | POA: Diagnosis not present

## 2024-02-23 DIAGNOSIS — I679 Cerebrovascular disease, unspecified: Secondary | ICD-10-CM | POA: Diagnosis not present

## 2024-02-23 DIAGNOSIS — G4089 Other seizures: Secondary | ICD-10-CM | POA: Diagnosis not present

## 2024-02-23 DIAGNOSIS — D378 Neoplasm of uncertain behavior of other specified digestive organs: Secondary | ICD-10-CM | POA: Diagnosis not present

## 2024-02-23 DIAGNOSIS — I10 Essential (primary) hypertension: Secondary | ICD-10-CM | POA: Diagnosis not present

## 2024-02-23 DIAGNOSIS — E876 Hypokalemia: Secondary | ICD-10-CM | POA: Diagnosis not present

## 2024-02-23 DIAGNOSIS — E039 Hypothyroidism, unspecified: Secondary | ICD-10-CM | POA: Diagnosis not present

## 2024-02-23 DIAGNOSIS — E7849 Other hyperlipidemia: Secondary | ICD-10-CM | POA: Diagnosis not present

## 2024-02-23 DIAGNOSIS — C729 Malignant neoplasm of central nervous system, unspecified: Secondary | ICD-10-CM | POA: Diagnosis not present

## 2024-02-24 DIAGNOSIS — G4089 Other seizures: Secondary | ICD-10-CM | POA: Diagnosis not present

## 2024-02-24 DIAGNOSIS — E7849 Other hyperlipidemia: Secondary | ICD-10-CM | POA: Diagnosis not present

## 2024-02-24 DIAGNOSIS — D378 Neoplasm of uncertain behavior of other specified digestive organs: Secondary | ICD-10-CM | POA: Diagnosis not present

## 2024-02-24 DIAGNOSIS — I10 Essential (primary) hypertension: Secondary | ICD-10-CM | POA: Diagnosis not present

## 2024-02-24 DIAGNOSIS — C729 Malignant neoplasm of central nervous system, unspecified: Secondary | ICD-10-CM | POA: Diagnosis not present

## 2024-02-24 DIAGNOSIS — E039 Hypothyroidism, unspecified: Secondary | ICD-10-CM | POA: Diagnosis not present

## 2024-02-24 DIAGNOSIS — E876 Hypokalemia: Secondary | ICD-10-CM | POA: Diagnosis not present

## 2024-02-24 DIAGNOSIS — I679 Cerebrovascular disease, unspecified: Secondary | ICD-10-CM | POA: Diagnosis not present

## 2024-02-25 DIAGNOSIS — D378 Neoplasm of uncertain behavior of other specified digestive organs: Secondary | ICD-10-CM | POA: Diagnosis not present

## 2024-02-25 DIAGNOSIS — E7849 Other hyperlipidemia: Secondary | ICD-10-CM | POA: Diagnosis not present

## 2024-02-25 DIAGNOSIS — E039 Hypothyroidism, unspecified: Secondary | ICD-10-CM | POA: Diagnosis not present

## 2024-02-25 DIAGNOSIS — I679 Cerebrovascular disease, unspecified: Secondary | ICD-10-CM | POA: Diagnosis not present

## 2024-02-25 DIAGNOSIS — G4089 Other seizures: Secondary | ICD-10-CM | POA: Diagnosis not present

## 2024-02-25 DIAGNOSIS — E876 Hypokalemia: Secondary | ICD-10-CM | POA: Diagnosis not present

## 2024-02-25 DIAGNOSIS — I10 Essential (primary) hypertension: Secondary | ICD-10-CM | POA: Diagnosis not present

## 2024-02-26 DIAGNOSIS — E039 Hypothyroidism, unspecified: Secondary | ICD-10-CM | POA: Diagnosis not present

## 2024-02-26 DIAGNOSIS — C729 Malignant neoplasm of central nervous system, unspecified: Secondary | ICD-10-CM | POA: Diagnosis not present

## 2024-02-26 DIAGNOSIS — G4089 Other seizures: Secondary | ICD-10-CM | POA: Diagnosis not present

## 2024-02-26 DIAGNOSIS — D378 Neoplasm of uncertain behavior of other specified digestive organs: Secondary | ICD-10-CM | POA: Diagnosis not present

## 2024-02-26 DIAGNOSIS — I10 Essential (primary) hypertension: Secondary | ICD-10-CM | POA: Diagnosis not present

## 2024-02-26 DIAGNOSIS — E876 Hypokalemia: Secondary | ICD-10-CM | POA: Diagnosis not present

## 2024-02-26 DIAGNOSIS — I679 Cerebrovascular disease, unspecified: Secondary | ICD-10-CM | POA: Diagnosis not present

## 2024-02-26 DIAGNOSIS — E7849 Other hyperlipidemia: Secondary | ICD-10-CM | POA: Diagnosis not present

## 2024-02-27 DIAGNOSIS — E7849 Other hyperlipidemia: Secondary | ICD-10-CM | POA: Diagnosis not present

## 2024-02-27 DIAGNOSIS — E039 Hypothyroidism, unspecified: Secondary | ICD-10-CM | POA: Diagnosis not present

## 2024-02-27 DIAGNOSIS — D378 Neoplasm of uncertain behavior of other specified digestive organs: Secondary | ICD-10-CM | POA: Diagnosis not present

## 2024-02-27 DIAGNOSIS — G4089 Other seizures: Secondary | ICD-10-CM | POA: Diagnosis not present

## 2024-02-27 DIAGNOSIS — C729 Malignant neoplasm of central nervous system, unspecified: Secondary | ICD-10-CM | POA: Diagnosis not present

## 2024-02-27 DIAGNOSIS — E876 Hypokalemia: Secondary | ICD-10-CM | POA: Diagnosis not present

## 2024-02-27 DIAGNOSIS — I679 Cerebrovascular disease, unspecified: Secondary | ICD-10-CM | POA: Diagnosis not present

## 2024-02-27 DIAGNOSIS — I10 Essential (primary) hypertension: Secondary | ICD-10-CM | POA: Diagnosis not present

## 2024-02-28 DIAGNOSIS — G4089 Other seizures: Secondary | ICD-10-CM | POA: Diagnosis not present

## 2024-02-28 DIAGNOSIS — E7849 Other hyperlipidemia: Secondary | ICD-10-CM | POA: Diagnosis not present

## 2024-02-28 DIAGNOSIS — I679 Cerebrovascular disease, unspecified: Secondary | ICD-10-CM | POA: Diagnosis not present

## 2024-02-28 DIAGNOSIS — I10 Essential (primary) hypertension: Secondary | ICD-10-CM | POA: Diagnosis not present

## 2024-02-28 DIAGNOSIS — E876 Hypokalemia: Secondary | ICD-10-CM | POA: Diagnosis not present

## 2024-02-28 DIAGNOSIS — E039 Hypothyroidism, unspecified: Secondary | ICD-10-CM | POA: Diagnosis not present

## 2024-02-28 DIAGNOSIS — D378 Neoplasm of uncertain behavior of other specified digestive organs: Secondary | ICD-10-CM | POA: Diagnosis not present

## 2024-02-28 DIAGNOSIS — C729 Malignant neoplasm of central nervous system, unspecified: Secondary | ICD-10-CM | POA: Diagnosis not present

## 2024-02-29 DIAGNOSIS — G4089 Other seizures: Secondary | ICD-10-CM | POA: Diagnosis not present

## 2024-02-29 DIAGNOSIS — D378 Neoplasm of uncertain behavior of other specified digestive organs: Secondary | ICD-10-CM | POA: Diagnosis not present

## 2024-02-29 DIAGNOSIS — E876 Hypokalemia: Secondary | ICD-10-CM | POA: Diagnosis not present

## 2024-02-29 DIAGNOSIS — I679 Cerebrovascular disease, unspecified: Secondary | ICD-10-CM | POA: Diagnosis not present

## 2024-02-29 DIAGNOSIS — I10 Essential (primary) hypertension: Secondary | ICD-10-CM | POA: Diagnosis not present

## 2024-02-29 DIAGNOSIS — C729 Malignant neoplasm of central nervous system, unspecified: Secondary | ICD-10-CM | POA: Diagnosis not present

## 2024-02-29 DIAGNOSIS — E7849 Other hyperlipidemia: Secondary | ICD-10-CM | POA: Diagnosis not present

## 2024-02-29 DIAGNOSIS — E039 Hypothyroidism, unspecified: Secondary | ICD-10-CM | POA: Diagnosis not present

## 2024-03-01 DIAGNOSIS — E876 Hypokalemia: Secondary | ICD-10-CM | POA: Diagnosis not present

## 2024-03-01 DIAGNOSIS — I679 Cerebrovascular disease, unspecified: Secondary | ICD-10-CM | POA: Diagnosis not present

## 2024-03-01 DIAGNOSIS — G4089 Other seizures: Secondary | ICD-10-CM | POA: Diagnosis not present

## 2024-03-01 DIAGNOSIS — I10 Essential (primary) hypertension: Secondary | ICD-10-CM | POA: Diagnosis not present

## 2024-03-01 DIAGNOSIS — D378 Neoplasm of uncertain behavior of other specified digestive organs: Secondary | ICD-10-CM | POA: Diagnosis not present

## 2024-03-01 DIAGNOSIS — C729 Malignant neoplasm of central nervous system, unspecified: Secondary | ICD-10-CM | POA: Diagnosis not present

## 2024-03-01 DIAGNOSIS — E7849 Other hyperlipidemia: Secondary | ICD-10-CM | POA: Diagnosis not present

## 2024-03-01 DIAGNOSIS — E039 Hypothyroidism, unspecified: Secondary | ICD-10-CM | POA: Diagnosis not present

## 2024-03-02 DIAGNOSIS — G4089 Other seizures: Secondary | ICD-10-CM | POA: Diagnosis not present

## 2024-03-02 DIAGNOSIS — C729 Malignant neoplasm of central nervous system, unspecified: Secondary | ICD-10-CM | POA: Diagnosis not present

## 2024-03-02 DIAGNOSIS — E876 Hypokalemia: Secondary | ICD-10-CM | POA: Diagnosis not present

## 2024-03-02 DIAGNOSIS — D378 Neoplasm of uncertain behavior of other specified digestive organs: Secondary | ICD-10-CM | POA: Diagnosis not present

## 2024-03-02 DIAGNOSIS — I10 Essential (primary) hypertension: Secondary | ICD-10-CM | POA: Diagnosis not present

## 2024-03-02 DIAGNOSIS — E7849 Other hyperlipidemia: Secondary | ICD-10-CM | POA: Diagnosis not present

## 2024-03-02 DIAGNOSIS — E039 Hypothyroidism, unspecified: Secondary | ICD-10-CM | POA: Diagnosis not present

## 2024-03-02 DIAGNOSIS — I679 Cerebrovascular disease, unspecified: Secondary | ICD-10-CM | POA: Diagnosis not present

## 2024-03-03 DIAGNOSIS — D378 Neoplasm of uncertain behavior of other specified digestive organs: Secondary | ICD-10-CM | POA: Diagnosis not present

## 2024-03-03 DIAGNOSIS — I10 Essential (primary) hypertension: Secondary | ICD-10-CM | POA: Diagnosis not present

## 2024-03-03 DIAGNOSIS — E7849 Other hyperlipidemia: Secondary | ICD-10-CM | POA: Diagnosis not present

## 2024-03-03 DIAGNOSIS — E039 Hypothyroidism, unspecified: Secondary | ICD-10-CM | POA: Diagnosis not present

## 2024-03-03 DIAGNOSIS — G4089 Other seizures: Secondary | ICD-10-CM | POA: Diagnosis not present

## 2024-03-03 DIAGNOSIS — E876 Hypokalemia: Secondary | ICD-10-CM | POA: Diagnosis not present

## 2024-03-03 DIAGNOSIS — I679 Cerebrovascular disease, unspecified: Secondary | ICD-10-CM | POA: Diagnosis not present

## 2024-03-04 DIAGNOSIS — G4089 Other seizures: Secondary | ICD-10-CM | POA: Diagnosis not present

## 2024-03-04 DIAGNOSIS — E039 Hypothyroidism, unspecified: Secondary | ICD-10-CM | POA: Diagnosis not present

## 2024-03-04 DIAGNOSIS — I10 Essential (primary) hypertension: Secondary | ICD-10-CM | POA: Diagnosis not present

## 2024-03-04 DIAGNOSIS — I679 Cerebrovascular disease, unspecified: Secondary | ICD-10-CM | POA: Diagnosis not present

## 2024-03-04 DIAGNOSIS — E7849 Other hyperlipidemia: Secondary | ICD-10-CM | POA: Diagnosis not present

## 2024-03-04 DIAGNOSIS — D378 Neoplasm of uncertain behavior of other specified digestive organs: Secondary | ICD-10-CM | POA: Diagnosis not present

## 2024-03-04 DIAGNOSIS — E876 Hypokalemia: Secondary | ICD-10-CM | POA: Diagnosis not present

## 2024-03-05 DIAGNOSIS — E039 Hypothyroidism, unspecified: Secondary | ICD-10-CM | POA: Diagnosis not present

## 2024-03-05 DIAGNOSIS — I10 Essential (primary) hypertension: Secondary | ICD-10-CM | POA: Diagnosis not present

## 2024-03-05 DIAGNOSIS — E876 Hypokalemia: Secondary | ICD-10-CM | POA: Diagnosis not present

## 2024-03-05 DIAGNOSIS — I679 Cerebrovascular disease, unspecified: Secondary | ICD-10-CM | POA: Diagnosis not present

## 2024-03-05 DIAGNOSIS — E7849 Other hyperlipidemia: Secondary | ICD-10-CM | POA: Diagnosis not present

## 2024-03-05 DIAGNOSIS — G4089 Other seizures: Secondary | ICD-10-CM | POA: Diagnosis not present

## 2024-03-05 DIAGNOSIS — D378 Neoplasm of uncertain behavior of other specified digestive organs: Secondary | ICD-10-CM | POA: Diagnosis not present

## 2024-03-06 DIAGNOSIS — C729 Malignant neoplasm of central nervous system, unspecified: Secondary | ICD-10-CM | POA: Diagnosis not present

## 2024-03-06 DIAGNOSIS — I10 Essential (primary) hypertension: Secondary | ICD-10-CM | POA: Diagnosis not present

## 2024-03-06 DIAGNOSIS — I679 Cerebrovascular disease, unspecified: Secondary | ICD-10-CM | POA: Diagnosis not present

## 2024-03-06 DIAGNOSIS — D378 Neoplasm of uncertain behavior of other specified digestive organs: Secondary | ICD-10-CM | POA: Diagnosis not present

## 2024-03-06 DIAGNOSIS — E039 Hypothyroidism, unspecified: Secondary | ICD-10-CM | POA: Diagnosis not present

## 2024-03-06 DIAGNOSIS — G4089 Other seizures: Secondary | ICD-10-CM | POA: Diagnosis not present

## 2024-03-06 DIAGNOSIS — E876 Hypokalemia: Secondary | ICD-10-CM | POA: Diagnosis not present

## 2024-03-06 DIAGNOSIS — E7849 Other hyperlipidemia: Secondary | ICD-10-CM | POA: Diagnosis not present

## 2024-03-07 DIAGNOSIS — I10 Essential (primary) hypertension: Secondary | ICD-10-CM | POA: Diagnosis not present

## 2024-03-07 DIAGNOSIS — D378 Neoplasm of uncertain behavior of other specified digestive organs: Secondary | ICD-10-CM | POA: Diagnosis not present

## 2024-03-07 DIAGNOSIS — E7849 Other hyperlipidemia: Secondary | ICD-10-CM | POA: Diagnosis not present

## 2024-03-07 DIAGNOSIS — I679 Cerebrovascular disease, unspecified: Secondary | ICD-10-CM | POA: Diagnosis not present

## 2024-03-07 DIAGNOSIS — E039 Hypothyroidism, unspecified: Secondary | ICD-10-CM | POA: Diagnosis not present

## 2024-03-07 DIAGNOSIS — C729 Malignant neoplasm of central nervous system, unspecified: Secondary | ICD-10-CM | POA: Diagnosis not present

## 2024-03-07 DIAGNOSIS — G4089 Other seizures: Secondary | ICD-10-CM | POA: Diagnosis not present

## 2024-03-07 DIAGNOSIS — E876 Hypokalemia: Secondary | ICD-10-CM | POA: Diagnosis not present

## 2024-03-08 DIAGNOSIS — D378 Neoplasm of uncertain behavior of other specified digestive organs: Secondary | ICD-10-CM | POA: Diagnosis not present

## 2024-03-08 DIAGNOSIS — G4089 Other seizures: Secondary | ICD-10-CM | POA: Diagnosis not present

## 2024-03-08 DIAGNOSIS — E7849 Other hyperlipidemia: Secondary | ICD-10-CM | POA: Diagnosis not present

## 2024-03-08 DIAGNOSIS — E039 Hypothyroidism, unspecified: Secondary | ICD-10-CM | POA: Diagnosis not present

## 2024-03-08 DIAGNOSIS — C729 Malignant neoplasm of central nervous system, unspecified: Secondary | ICD-10-CM | POA: Diagnosis not present

## 2024-03-08 DIAGNOSIS — E876 Hypokalemia: Secondary | ICD-10-CM | POA: Diagnosis not present

## 2024-03-08 DIAGNOSIS — I10 Essential (primary) hypertension: Secondary | ICD-10-CM | POA: Diagnosis not present

## 2024-03-08 DIAGNOSIS — I679 Cerebrovascular disease, unspecified: Secondary | ICD-10-CM | POA: Diagnosis not present

## 2024-03-09 DIAGNOSIS — G4089 Other seizures: Secondary | ICD-10-CM | POA: Diagnosis not present

## 2024-03-09 DIAGNOSIS — E876 Hypokalemia: Secondary | ICD-10-CM | POA: Diagnosis not present

## 2024-03-09 DIAGNOSIS — E039 Hypothyroidism, unspecified: Secondary | ICD-10-CM | POA: Diagnosis not present

## 2024-03-09 DIAGNOSIS — I10 Essential (primary) hypertension: Secondary | ICD-10-CM | POA: Diagnosis not present

## 2024-03-09 DIAGNOSIS — C729 Malignant neoplasm of central nervous system, unspecified: Secondary | ICD-10-CM | POA: Diagnosis not present

## 2024-03-09 DIAGNOSIS — I679 Cerebrovascular disease, unspecified: Secondary | ICD-10-CM | POA: Diagnosis not present

## 2024-03-09 DIAGNOSIS — E7849 Other hyperlipidemia: Secondary | ICD-10-CM | POA: Diagnosis not present

## 2024-03-09 DIAGNOSIS — D378 Neoplasm of uncertain behavior of other specified digestive organs: Secondary | ICD-10-CM | POA: Diagnosis not present

## 2024-03-10 DIAGNOSIS — E039 Hypothyroidism, unspecified: Secondary | ICD-10-CM | POA: Diagnosis not present

## 2024-03-10 DIAGNOSIS — E876 Hypokalemia: Secondary | ICD-10-CM | POA: Diagnosis not present

## 2024-03-10 DIAGNOSIS — D378 Neoplasm of uncertain behavior of other specified digestive organs: Secondary | ICD-10-CM | POA: Diagnosis not present

## 2024-03-10 DIAGNOSIS — E7849 Other hyperlipidemia: Secondary | ICD-10-CM | POA: Diagnosis not present

## 2024-03-10 DIAGNOSIS — G4089 Other seizures: Secondary | ICD-10-CM | POA: Diagnosis not present

## 2024-03-10 DIAGNOSIS — I679 Cerebrovascular disease, unspecified: Secondary | ICD-10-CM | POA: Diagnosis not present

## 2024-03-10 DIAGNOSIS — C729 Malignant neoplasm of central nervous system, unspecified: Secondary | ICD-10-CM | POA: Diagnosis not present

## 2024-03-10 DIAGNOSIS — I10 Essential (primary) hypertension: Secondary | ICD-10-CM | POA: Diagnosis not present

## 2024-03-11 DIAGNOSIS — C729 Malignant neoplasm of central nervous system, unspecified: Secondary | ICD-10-CM | POA: Diagnosis not present

## 2024-03-11 DIAGNOSIS — E876 Hypokalemia: Secondary | ICD-10-CM | POA: Diagnosis not present

## 2024-03-11 DIAGNOSIS — E7849 Other hyperlipidemia: Secondary | ICD-10-CM | POA: Diagnosis not present

## 2024-03-11 DIAGNOSIS — I679 Cerebrovascular disease, unspecified: Secondary | ICD-10-CM | POA: Diagnosis not present

## 2024-03-11 DIAGNOSIS — I10 Essential (primary) hypertension: Secondary | ICD-10-CM | POA: Diagnosis not present

## 2024-03-11 DIAGNOSIS — G4089 Other seizures: Secondary | ICD-10-CM | POA: Diagnosis not present

## 2024-03-11 DIAGNOSIS — D378 Neoplasm of uncertain behavior of other specified digestive organs: Secondary | ICD-10-CM | POA: Diagnosis not present

## 2024-03-11 DIAGNOSIS — E039 Hypothyroidism, unspecified: Secondary | ICD-10-CM | POA: Diagnosis not present

## 2024-03-12 DIAGNOSIS — E876 Hypokalemia: Secondary | ICD-10-CM | POA: Diagnosis not present

## 2024-03-12 DIAGNOSIS — G4089 Other seizures: Secondary | ICD-10-CM | POA: Diagnosis not present

## 2024-03-12 DIAGNOSIS — E7849 Other hyperlipidemia: Secondary | ICD-10-CM | POA: Diagnosis not present

## 2024-03-12 DIAGNOSIS — C729 Malignant neoplasm of central nervous system, unspecified: Secondary | ICD-10-CM | POA: Diagnosis not present

## 2024-03-12 DIAGNOSIS — I10 Essential (primary) hypertension: Secondary | ICD-10-CM | POA: Diagnosis not present

## 2024-03-12 DIAGNOSIS — I679 Cerebrovascular disease, unspecified: Secondary | ICD-10-CM | POA: Diagnosis not present

## 2024-03-12 DIAGNOSIS — E039 Hypothyroidism, unspecified: Secondary | ICD-10-CM | POA: Diagnosis not present

## 2024-03-12 DIAGNOSIS — D378 Neoplasm of uncertain behavior of other specified digestive organs: Secondary | ICD-10-CM | POA: Diagnosis not present

## 2024-03-13 DIAGNOSIS — C729 Malignant neoplasm of central nervous system, unspecified: Secondary | ICD-10-CM | POA: Diagnosis not present

## 2024-03-13 DIAGNOSIS — E7849 Other hyperlipidemia: Secondary | ICD-10-CM | POA: Diagnosis not present

## 2024-03-13 DIAGNOSIS — I679 Cerebrovascular disease, unspecified: Secondary | ICD-10-CM | POA: Diagnosis not present

## 2024-03-13 DIAGNOSIS — D378 Neoplasm of uncertain behavior of other specified digestive organs: Secondary | ICD-10-CM | POA: Diagnosis not present

## 2024-03-13 DIAGNOSIS — G4089 Other seizures: Secondary | ICD-10-CM | POA: Diagnosis not present

## 2024-03-13 DIAGNOSIS — E876 Hypokalemia: Secondary | ICD-10-CM | POA: Diagnosis not present

## 2024-03-13 DIAGNOSIS — E039 Hypothyroidism, unspecified: Secondary | ICD-10-CM | POA: Diagnosis not present

## 2024-03-13 DIAGNOSIS — I10 Essential (primary) hypertension: Secondary | ICD-10-CM | POA: Diagnosis not present

## 2024-03-14 DIAGNOSIS — I679 Cerebrovascular disease, unspecified: Secondary | ICD-10-CM | POA: Diagnosis not present

## 2024-03-14 DIAGNOSIS — I10 Essential (primary) hypertension: Secondary | ICD-10-CM | POA: Diagnosis not present

## 2024-03-14 DIAGNOSIS — D378 Neoplasm of uncertain behavior of other specified digestive organs: Secondary | ICD-10-CM | POA: Diagnosis not present

## 2024-03-14 DIAGNOSIS — C729 Malignant neoplasm of central nervous system, unspecified: Secondary | ICD-10-CM | POA: Diagnosis not present

## 2024-03-14 DIAGNOSIS — E7849 Other hyperlipidemia: Secondary | ICD-10-CM | POA: Diagnosis not present

## 2024-03-14 DIAGNOSIS — E876 Hypokalemia: Secondary | ICD-10-CM | POA: Diagnosis not present

## 2024-03-14 DIAGNOSIS — E039 Hypothyroidism, unspecified: Secondary | ICD-10-CM | POA: Diagnosis not present

## 2024-03-14 DIAGNOSIS — G4089 Other seizures: Secondary | ICD-10-CM | POA: Diagnosis not present

## 2024-03-15 DIAGNOSIS — E7849 Other hyperlipidemia: Secondary | ICD-10-CM | POA: Diagnosis not present

## 2024-03-15 DIAGNOSIS — E876 Hypokalemia: Secondary | ICD-10-CM | POA: Diagnosis not present

## 2024-03-15 DIAGNOSIS — D378 Neoplasm of uncertain behavior of other specified digestive organs: Secondary | ICD-10-CM | POA: Diagnosis not present

## 2024-03-15 DIAGNOSIS — C729 Malignant neoplasm of central nervous system, unspecified: Secondary | ICD-10-CM | POA: Diagnosis not present

## 2024-03-15 DIAGNOSIS — I679 Cerebrovascular disease, unspecified: Secondary | ICD-10-CM | POA: Diagnosis not present

## 2024-03-15 DIAGNOSIS — E039 Hypothyroidism, unspecified: Secondary | ICD-10-CM | POA: Diagnosis not present

## 2024-03-15 DIAGNOSIS — I10 Essential (primary) hypertension: Secondary | ICD-10-CM | POA: Diagnosis not present

## 2024-03-15 DIAGNOSIS — G4089 Other seizures: Secondary | ICD-10-CM | POA: Diagnosis not present

## 2024-03-16 DIAGNOSIS — I10 Essential (primary) hypertension: Secondary | ICD-10-CM | POA: Diagnosis not present

## 2024-03-16 DIAGNOSIS — E039 Hypothyroidism, unspecified: Secondary | ICD-10-CM | POA: Diagnosis not present

## 2024-03-16 DIAGNOSIS — G4089 Other seizures: Secondary | ICD-10-CM | POA: Diagnosis not present

## 2024-03-16 DIAGNOSIS — D378 Neoplasm of uncertain behavior of other specified digestive organs: Secondary | ICD-10-CM | POA: Diagnosis not present

## 2024-03-16 DIAGNOSIS — C729 Malignant neoplasm of central nervous system, unspecified: Secondary | ICD-10-CM | POA: Diagnosis not present

## 2024-03-16 DIAGNOSIS — I679 Cerebrovascular disease, unspecified: Secondary | ICD-10-CM | POA: Diagnosis not present

## 2024-03-16 DIAGNOSIS — E876 Hypokalemia: Secondary | ICD-10-CM | POA: Diagnosis not present

## 2024-03-16 DIAGNOSIS — E7849 Other hyperlipidemia: Secondary | ICD-10-CM | POA: Diagnosis not present

## 2024-03-17 DIAGNOSIS — G4089 Other seizures: Secondary | ICD-10-CM | POA: Diagnosis not present

## 2024-03-17 DIAGNOSIS — E7849 Other hyperlipidemia: Secondary | ICD-10-CM | POA: Diagnosis not present

## 2024-03-17 DIAGNOSIS — I10 Essential (primary) hypertension: Secondary | ICD-10-CM | POA: Diagnosis not present

## 2024-03-17 DIAGNOSIS — D378 Neoplasm of uncertain behavior of other specified digestive organs: Secondary | ICD-10-CM | POA: Diagnosis not present

## 2024-03-17 DIAGNOSIS — I679 Cerebrovascular disease, unspecified: Secondary | ICD-10-CM | POA: Diagnosis not present

## 2024-03-17 DIAGNOSIS — E876 Hypokalemia: Secondary | ICD-10-CM | POA: Diagnosis not present

## 2024-03-17 DIAGNOSIS — E039 Hypothyroidism, unspecified: Secondary | ICD-10-CM | POA: Diagnosis not present

## 2024-03-17 DIAGNOSIS — C729 Malignant neoplasm of central nervous system, unspecified: Secondary | ICD-10-CM | POA: Diagnosis not present

## 2024-03-18 DIAGNOSIS — I679 Cerebrovascular disease, unspecified: Secondary | ICD-10-CM | POA: Diagnosis not present

## 2024-03-18 DIAGNOSIS — G4089 Other seizures: Secondary | ICD-10-CM | POA: Diagnosis not present

## 2024-03-18 DIAGNOSIS — D378 Neoplasm of uncertain behavior of other specified digestive organs: Secondary | ICD-10-CM | POA: Diagnosis not present

## 2024-03-18 DIAGNOSIS — E039 Hypothyroidism, unspecified: Secondary | ICD-10-CM | POA: Diagnosis not present

## 2024-03-18 DIAGNOSIS — I10 Essential (primary) hypertension: Secondary | ICD-10-CM | POA: Diagnosis not present

## 2024-03-18 DIAGNOSIS — C729 Malignant neoplasm of central nervous system, unspecified: Secondary | ICD-10-CM | POA: Diagnosis not present

## 2024-03-18 DIAGNOSIS — E7849 Other hyperlipidemia: Secondary | ICD-10-CM | POA: Diagnosis not present

## 2024-03-18 DIAGNOSIS — E876 Hypokalemia: Secondary | ICD-10-CM | POA: Diagnosis not present

## 2024-03-19 DIAGNOSIS — E039 Hypothyroidism, unspecified: Secondary | ICD-10-CM | POA: Diagnosis not present

## 2024-03-19 DIAGNOSIS — D378 Neoplasm of uncertain behavior of other specified digestive organs: Secondary | ICD-10-CM | POA: Diagnosis not present

## 2024-03-19 DIAGNOSIS — I10 Essential (primary) hypertension: Secondary | ICD-10-CM | POA: Diagnosis not present

## 2024-03-19 DIAGNOSIS — C729 Malignant neoplasm of central nervous system, unspecified: Secondary | ICD-10-CM | POA: Diagnosis not present

## 2024-03-19 DIAGNOSIS — E876 Hypokalemia: Secondary | ICD-10-CM | POA: Diagnosis not present

## 2024-03-19 DIAGNOSIS — G4089 Other seizures: Secondary | ICD-10-CM | POA: Diagnosis not present

## 2024-03-19 DIAGNOSIS — E7849 Other hyperlipidemia: Secondary | ICD-10-CM | POA: Diagnosis not present

## 2024-03-19 DIAGNOSIS — I679 Cerebrovascular disease, unspecified: Secondary | ICD-10-CM | POA: Diagnosis not present

## 2024-03-20 DIAGNOSIS — G4089 Other seizures: Secondary | ICD-10-CM | POA: Diagnosis not present

## 2024-03-20 DIAGNOSIS — E876 Hypokalemia: Secondary | ICD-10-CM | POA: Diagnosis not present

## 2024-03-20 DIAGNOSIS — C729 Malignant neoplasm of central nervous system, unspecified: Secondary | ICD-10-CM | POA: Diagnosis not present

## 2024-03-20 DIAGNOSIS — D378 Neoplasm of uncertain behavior of other specified digestive organs: Secondary | ICD-10-CM | POA: Diagnosis not present

## 2024-03-20 DIAGNOSIS — E7849 Other hyperlipidemia: Secondary | ICD-10-CM | POA: Diagnosis not present

## 2024-03-20 DIAGNOSIS — I679 Cerebrovascular disease, unspecified: Secondary | ICD-10-CM | POA: Diagnosis not present

## 2024-03-20 DIAGNOSIS — I10 Essential (primary) hypertension: Secondary | ICD-10-CM | POA: Diagnosis not present

## 2024-03-20 DIAGNOSIS — E039 Hypothyroidism, unspecified: Secondary | ICD-10-CM | POA: Diagnosis not present

## 2024-03-21 DIAGNOSIS — E7849 Other hyperlipidemia: Secondary | ICD-10-CM | POA: Diagnosis not present

## 2024-03-21 DIAGNOSIS — G4089 Other seizures: Secondary | ICD-10-CM | POA: Diagnosis not present

## 2024-03-21 DIAGNOSIS — I10 Essential (primary) hypertension: Secondary | ICD-10-CM | POA: Diagnosis not present

## 2024-03-21 DIAGNOSIS — I679 Cerebrovascular disease, unspecified: Secondary | ICD-10-CM | POA: Diagnosis not present

## 2024-03-21 DIAGNOSIS — E039 Hypothyroidism, unspecified: Secondary | ICD-10-CM | POA: Diagnosis not present

## 2024-03-21 DIAGNOSIS — D378 Neoplasm of uncertain behavior of other specified digestive organs: Secondary | ICD-10-CM | POA: Diagnosis not present

## 2024-03-21 DIAGNOSIS — E876 Hypokalemia: Secondary | ICD-10-CM | POA: Diagnosis not present

## 2024-03-21 DIAGNOSIS — C729 Malignant neoplasm of central nervous system, unspecified: Secondary | ICD-10-CM | POA: Diagnosis not present

## 2024-03-22 DIAGNOSIS — I679 Cerebrovascular disease, unspecified: Secondary | ICD-10-CM | POA: Diagnosis not present

## 2024-03-22 DIAGNOSIS — E7849 Other hyperlipidemia: Secondary | ICD-10-CM | POA: Diagnosis not present

## 2024-03-22 DIAGNOSIS — E039 Hypothyroidism, unspecified: Secondary | ICD-10-CM | POA: Diagnosis not present

## 2024-03-22 DIAGNOSIS — D378 Neoplasm of uncertain behavior of other specified digestive organs: Secondary | ICD-10-CM | POA: Diagnosis not present

## 2024-03-22 DIAGNOSIS — I10 Essential (primary) hypertension: Secondary | ICD-10-CM | POA: Diagnosis not present

## 2024-03-22 DIAGNOSIS — G4089 Other seizures: Secondary | ICD-10-CM | POA: Diagnosis not present

## 2024-03-22 DIAGNOSIS — C729 Malignant neoplasm of central nervous system, unspecified: Secondary | ICD-10-CM | POA: Diagnosis not present

## 2024-03-22 DIAGNOSIS — E876 Hypokalemia: Secondary | ICD-10-CM | POA: Diagnosis not present

## 2024-03-23 DIAGNOSIS — E039 Hypothyroidism, unspecified: Secondary | ICD-10-CM | POA: Diagnosis not present

## 2024-03-23 DIAGNOSIS — I679 Cerebrovascular disease, unspecified: Secondary | ICD-10-CM | POA: Diagnosis not present

## 2024-03-23 DIAGNOSIS — G4089 Other seizures: Secondary | ICD-10-CM | POA: Diagnosis not present

## 2024-03-23 DIAGNOSIS — E876 Hypokalemia: Secondary | ICD-10-CM | POA: Diagnosis not present

## 2024-03-23 DIAGNOSIS — C729 Malignant neoplasm of central nervous system, unspecified: Secondary | ICD-10-CM | POA: Diagnosis not present

## 2024-03-23 DIAGNOSIS — E7849 Other hyperlipidemia: Secondary | ICD-10-CM | POA: Diagnosis not present

## 2024-03-23 DIAGNOSIS — I10 Essential (primary) hypertension: Secondary | ICD-10-CM | POA: Diagnosis not present

## 2024-03-23 DIAGNOSIS — D378 Neoplasm of uncertain behavior of other specified digestive organs: Secondary | ICD-10-CM | POA: Diagnosis not present

## 2024-03-24 DIAGNOSIS — I10 Essential (primary) hypertension: Secondary | ICD-10-CM | POA: Diagnosis not present

## 2024-03-24 DIAGNOSIS — E7849 Other hyperlipidemia: Secondary | ICD-10-CM | POA: Diagnosis not present

## 2024-03-24 DIAGNOSIS — C729 Malignant neoplasm of central nervous system, unspecified: Secondary | ICD-10-CM | POA: Diagnosis not present

## 2024-03-24 DIAGNOSIS — E876 Hypokalemia: Secondary | ICD-10-CM | POA: Diagnosis not present

## 2024-03-24 DIAGNOSIS — D378 Neoplasm of uncertain behavior of other specified digestive organs: Secondary | ICD-10-CM | POA: Diagnosis not present

## 2024-03-24 DIAGNOSIS — I679 Cerebrovascular disease, unspecified: Secondary | ICD-10-CM | POA: Diagnosis not present

## 2024-03-24 DIAGNOSIS — G4089 Other seizures: Secondary | ICD-10-CM | POA: Diagnosis not present

## 2024-03-24 DIAGNOSIS — E039 Hypothyroidism, unspecified: Secondary | ICD-10-CM | POA: Diagnosis not present

## 2024-03-25 DIAGNOSIS — E876 Hypokalemia: Secondary | ICD-10-CM | POA: Diagnosis not present

## 2024-03-25 DIAGNOSIS — E7849 Other hyperlipidemia: Secondary | ICD-10-CM | POA: Diagnosis not present

## 2024-03-25 DIAGNOSIS — C729 Malignant neoplasm of central nervous system, unspecified: Secondary | ICD-10-CM | POA: Diagnosis not present

## 2024-03-25 DIAGNOSIS — I10 Essential (primary) hypertension: Secondary | ICD-10-CM | POA: Diagnosis not present

## 2024-03-25 DIAGNOSIS — I679 Cerebrovascular disease, unspecified: Secondary | ICD-10-CM | POA: Diagnosis not present

## 2024-03-25 DIAGNOSIS — G4089 Other seizures: Secondary | ICD-10-CM | POA: Diagnosis not present

## 2024-03-25 DIAGNOSIS — E039 Hypothyroidism, unspecified: Secondary | ICD-10-CM | POA: Diagnosis not present

## 2024-03-25 DIAGNOSIS — D378 Neoplasm of uncertain behavior of other specified digestive organs: Secondary | ICD-10-CM | POA: Diagnosis not present

## 2024-03-26 DIAGNOSIS — E039 Hypothyroidism, unspecified: Secondary | ICD-10-CM | POA: Diagnosis not present

## 2024-03-26 DIAGNOSIS — E7849 Other hyperlipidemia: Secondary | ICD-10-CM | POA: Diagnosis not present

## 2024-03-26 DIAGNOSIS — E876 Hypokalemia: Secondary | ICD-10-CM | POA: Diagnosis not present

## 2024-03-26 DIAGNOSIS — D378 Neoplasm of uncertain behavior of other specified digestive organs: Secondary | ICD-10-CM | POA: Diagnosis not present

## 2024-03-26 DIAGNOSIS — C729 Malignant neoplasm of central nervous system, unspecified: Secondary | ICD-10-CM | POA: Diagnosis not present

## 2024-03-26 DIAGNOSIS — I10 Essential (primary) hypertension: Secondary | ICD-10-CM | POA: Diagnosis not present

## 2024-03-26 DIAGNOSIS — I679 Cerebrovascular disease, unspecified: Secondary | ICD-10-CM | POA: Diagnosis not present

## 2024-03-26 DIAGNOSIS — G4089 Other seizures: Secondary | ICD-10-CM | POA: Diagnosis not present

## 2024-03-27 DIAGNOSIS — C729 Malignant neoplasm of central nervous system, unspecified: Secondary | ICD-10-CM | POA: Diagnosis not present

## 2024-03-27 DIAGNOSIS — I679 Cerebrovascular disease, unspecified: Secondary | ICD-10-CM | POA: Diagnosis not present

## 2024-03-27 DIAGNOSIS — E7849 Other hyperlipidemia: Secondary | ICD-10-CM | POA: Diagnosis not present

## 2024-03-27 DIAGNOSIS — E876 Hypokalemia: Secondary | ICD-10-CM | POA: Diagnosis not present

## 2024-03-27 DIAGNOSIS — G4089 Other seizures: Secondary | ICD-10-CM | POA: Diagnosis not present

## 2024-03-27 DIAGNOSIS — I10 Essential (primary) hypertension: Secondary | ICD-10-CM | POA: Diagnosis not present

## 2024-03-27 DIAGNOSIS — E039 Hypothyroidism, unspecified: Secondary | ICD-10-CM | POA: Diagnosis not present

## 2024-03-27 DIAGNOSIS — D378 Neoplasm of uncertain behavior of other specified digestive organs: Secondary | ICD-10-CM | POA: Diagnosis not present

## 2024-03-28 DIAGNOSIS — E7849 Other hyperlipidemia: Secondary | ICD-10-CM | POA: Diagnosis not present

## 2024-03-28 DIAGNOSIS — E876 Hypokalemia: Secondary | ICD-10-CM | POA: Diagnosis not present

## 2024-03-28 DIAGNOSIS — I679 Cerebrovascular disease, unspecified: Secondary | ICD-10-CM | POA: Diagnosis not present

## 2024-03-28 DIAGNOSIS — E039 Hypothyroidism, unspecified: Secondary | ICD-10-CM | POA: Diagnosis not present

## 2024-03-28 DIAGNOSIS — C729 Malignant neoplasm of central nervous system, unspecified: Secondary | ICD-10-CM | POA: Diagnosis not present

## 2024-03-28 DIAGNOSIS — I10 Essential (primary) hypertension: Secondary | ICD-10-CM | POA: Diagnosis not present

## 2024-03-28 DIAGNOSIS — D378 Neoplasm of uncertain behavior of other specified digestive organs: Secondary | ICD-10-CM | POA: Diagnosis not present

## 2024-03-28 DIAGNOSIS — G4089 Other seizures: Secondary | ICD-10-CM | POA: Diagnosis not present

## 2024-03-29 DIAGNOSIS — G4089 Other seizures: Secondary | ICD-10-CM | POA: Diagnosis not present

## 2024-03-29 DIAGNOSIS — D378 Neoplasm of uncertain behavior of other specified digestive organs: Secondary | ICD-10-CM | POA: Diagnosis not present

## 2024-03-29 DIAGNOSIS — E876 Hypokalemia: Secondary | ICD-10-CM | POA: Diagnosis not present

## 2024-03-29 DIAGNOSIS — C729 Malignant neoplasm of central nervous system, unspecified: Secondary | ICD-10-CM | POA: Diagnosis not present

## 2024-03-29 DIAGNOSIS — I10 Essential (primary) hypertension: Secondary | ICD-10-CM | POA: Diagnosis not present

## 2024-03-29 DIAGNOSIS — E039 Hypothyroidism, unspecified: Secondary | ICD-10-CM | POA: Diagnosis not present

## 2024-03-29 DIAGNOSIS — I679 Cerebrovascular disease, unspecified: Secondary | ICD-10-CM | POA: Diagnosis not present

## 2024-03-29 DIAGNOSIS — E7849 Other hyperlipidemia: Secondary | ICD-10-CM | POA: Diagnosis not present

## 2024-03-30 DIAGNOSIS — E7849 Other hyperlipidemia: Secondary | ICD-10-CM | POA: Diagnosis not present

## 2024-03-30 DIAGNOSIS — E039 Hypothyroidism, unspecified: Secondary | ICD-10-CM | POA: Diagnosis not present

## 2024-03-30 DIAGNOSIS — I679 Cerebrovascular disease, unspecified: Secondary | ICD-10-CM | POA: Diagnosis not present

## 2024-03-30 DIAGNOSIS — E876 Hypokalemia: Secondary | ICD-10-CM | POA: Diagnosis not present

## 2024-03-30 DIAGNOSIS — D378 Neoplasm of uncertain behavior of other specified digestive organs: Secondary | ICD-10-CM | POA: Diagnosis not present

## 2024-03-30 DIAGNOSIS — C729 Malignant neoplasm of central nervous system, unspecified: Secondary | ICD-10-CM | POA: Diagnosis not present

## 2024-03-30 DIAGNOSIS — I10 Essential (primary) hypertension: Secondary | ICD-10-CM | POA: Diagnosis not present

## 2024-03-30 DIAGNOSIS — G4089 Other seizures: Secondary | ICD-10-CM | POA: Diagnosis not present

## 2024-03-31 DIAGNOSIS — E039 Hypothyroidism, unspecified: Secondary | ICD-10-CM | POA: Diagnosis not present

## 2024-03-31 DIAGNOSIS — I10 Essential (primary) hypertension: Secondary | ICD-10-CM | POA: Diagnosis not present

## 2024-03-31 DIAGNOSIS — D378 Neoplasm of uncertain behavior of other specified digestive organs: Secondary | ICD-10-CM | POA: Diagnosis not present

## 2024-03-31 DIAGNOSIS — E7849 Other hyperlipidemia: Secondary | ICD-10-CM | POA: Diagnosis not present

## 2024-03-31 DIAGNOSIS — I679 Cerebrovascular disease, unspecified: Secondary | ICD-10-CM | POA: Diagnosis not present

## 2024-03-31 DIAGNOSIS — G4089 Other seizures: Secondary | ICD-10-CM | POA: Diagnosis not present

## 2024-03-31 DIAGNOSIS — C729 Malignant neoplasm of central nervous system, unspecified: Secondary | ICD-10-CM | POA: Diagnosis not present

## 2024-03-31 DIAGNOSIS — E876 Hypokalemia: Secondary | ICD-10-CM | POA: Diagnosis not present

## 2024-04-01 DIAGNOSIS — C729 Malignant neoplasm of central nervous system, unspecified: Secondary | ICD-10-CM | POA: Diagnosis not present

## 2024-04-01 DIAGNOSIS — E876 Hypokalemia: Secondary | ICD-10-CM | POA: Diagnosis not present

## 2024-04-01 DIAGNOSIS — I10 Essential (primary) hypertension: Secondary | ICD-10-CM | POA: Diagnosis not present

## 2024-04-01 DIAGNOSIS — E7849 Other hyperlipidemia: Secondary | ICD-10-CM | POA: Diagnosis not present

## 2024-04-01 DIAGNOSIS — G4089 Other seizures: Secondary | ICD-10-CM | POA: Diagnosis not present

## 2024-04-01 DIAGNOSIS — D378 Neoplasm of uncertain behavior of other specified digestive organs: Secondary | ICD-10-CM | POA: Diagnosis not present

## 2024-04-01 DIAGNOSIS — I679 Cerebrovascular disease, unspecified: Secondary | ICD-10-CM | POA: Diagnosis not present

## 2024-04-01 DIAGNOSIS — E039 Hypothyroidism, unspecified: Secondary | ICD-10-CM | POA: Diagnosis not present

## 2024-04-02 DIAGNOSIS — G4089 Other seizures: Secondary | ICD-10-CM | POA: Diagnosis not present

## 2024-04-02 DIAGNOSIS — C729 Malignant neoplasm of central nervous system, unspecified: Secondary | ICD-10-CM | POA: Diagnosis not present

## 2024-04-02 DIAGNOSIS — I10 Essential (primary) hypertension: Secondary | ICD-10-CM | POA: Diagnosis not present

## 2024-04-02 DIAGNOSIS — E876 Hypokalemia: Secondary | ICD-10-CM | POA: Diagnosis not present

## 2024-04-02 DIAGNOSIS — E7849 Other hyperlipidemia: Secondary | ICD-10-CM | POA: Diagnosis not present

## 2024-04-02 DIAGNOSIS — I679 Cerebrovascular disease, unspecified: Secondary | ICD-10-CM | POA: Diagnosis not present

## 2024-04-02 DIAGNOSIS — E039 Hypothyroidism, unspecified: Secondary | ICD-10-CM | POA: Diagnosis not present

## 2024-04-02 DIAGNOSIS — D378 Neoplasm of uncertain behavior of other specified digestive organs: Secondary | ICD-10-CM | POA: Diagnosis not present

## 2024-04-03 DIAGNOSIS — C729 Malignant neoplasm of central nervous system, unspecified: Secondary | ICD-10-CM | POA: Diagnosis not present

## 2024-04-03 DIAGNOSIS — D378 Neoplasm of uncertain behavior of other specified digestive organs: Secondary | ICD-10-CM | POA: Diagnosis not present

## 2024-04-03 DIAGNOSIS — I679 Cerebrovascular disease, unspecified: Secondary | ICD-10-CM | POA: Diagnosis not present

## 2024-04-03 DIAGNOSIS — I10 Essential (primary) hypertension: Secondary | ICD-10-CM | POA: Diagnosis not present

## 2024-04-03 DIAGNOSIS — E876 Hypokalemia: Secondary | ICD-10-CM | POA: Diagnosis not present

## 2024-04-03 DIAGNOSIS — E7849 Other hyperlipidemia: Secondary | ICD-10-CM | POA: Diagnosis not present

## 2024-04-03 DIAGNOSIS — G4089 Other seizures: Secondary | ICD-10-CM | POA: Diagnosis not present

## 2024-04-03 DIAGNOSIS — E039 Hypothyroidism, unspecified: Secondary | ICD-10-CM | POA: Diagnosis not present

## 2024-04-04 DIAGNOSIS — I679 Cerebrovascular disease, unspecified: Secondary | ICD-10-CM | POA: Diagnosis not present

## 2024-04-04 DIAGNOSIS — E876 Hypokalemia: Secondary | ICD-10-CM | POA: Diagnosis not present

## 2024-04-04 DIAGNOSIS — E7849 Other hyperlipidemia: Secondary | ICD-10-CM | POA: Diagnosis not present

## 2024-04-04 DIAGNOSIS — D378 Neoplasm of uncertain behavior of other specified digestive organs: Secondary | ICD-10-CM | POA: Diagnosis not present

## 2024-04-04 DIAGNOSIS — G4089 Other seizures: Secondary | ICD-10-CM | POA: Diagnosis not present

## 2024-04-04 DIAGNOSIS — C729 Malignant neoplasm of central nervous system, unspecified: Secondary | ICD-10-CM | POA: Diagnosis not present

## 2024-04-04 DIAGNOSIS — I10 Essential (primary) hypertension: Secondary | ICD-10-CM | POA: Diagnosis not present

## 2024-04-04 DIAGNOSIS — E039 Hypothyroidism, unspecified: Secondary | ICD-10-CM | POA: Diagnosis not present

## 2024-04-07 ENCOUNTER — Other Ambulatory Visit: Payer: Self-pay | Admitting: Internal Medicine

## 2024-04-08 ENCOUNTER — Encounter: Payer: Self-pay | Admitting: Internal Medicine
